# Patient Record
Sex: Female | Born: 1940
Health system: Southern US, Community
[De-identification: ages and names within clinical notes are randomized; demographics above are authoritative.]

## PROBLEM LIST (undated history)

## (undated) DIAGNOSIS — G479 Sleep disorder, unspecified: Secondary | ICD-10-CM

## (undated) DIAGNOSIS — I2510 Atherosclerotic heart disease of native coronary artery without angina pectoris: Secondary | ICD-10-CM

## (undated) DIAGNOSIS — F32A Depression, unspecified: Secondary | ICD-10-CM

## (undated) DIAGNOSIS — R011 Cardiac murmur, unspecified: Secondary | ICD-10-CM

## (undated) DIAGNOSIS — Z8719 Personal history of other diseases of the digestive system: Secondary | ICD-10-CM

## (undated) DIAGNOSIS — E213 Hyperparathyroidism, unspecified: Secondary | ICD-10-CM

## (undated) DIAGNOSIS — D649 Anemia, unspecified: Secondary | ICD-10-CM

## (undated) DIAGNOSIS — M797 Fibromyalgia: Secondary | ICD-10-CM

## (undated) DIAGNOSIS — E079 Disorder of thyroid, unspecified: Secondary | ICD-10-CM

## (undated) DIAGNOSIS — M353 Polymyalgia rheumatica: Secondary | ICD-10-CM

## (undated) DIAGNOSIS — F329 Major depressive disorder, single episode, unspecified: Secondary | ICD-10-CM

## (undated) DIAGNOSIS — M1990 Unspecified osteoarthritis, unspecified site: Secondary | ICD-10-CM

## (undated) DIAGNOSIS — F419 Anxiety disorder, unspecified: Secondary | ICD-10-CM

## (undated) DIAGNOSIS — E538 Deficiency of other specified B group vitamins: Secondary | ICD-10-CM

## (undated) DIAGNOSIS — I10 Essential (primary) hypertension: Secondary | ICD-10-CM

## (undated) DIAGNOSIS — E119 Type 2 diabetes mellitus without complications: Secondary | ICD-10-CM

## (undated) DIAGNOSIS — E039 Hypothyroidism, unspecified: Secondary | ICD-10-CM

## (undated) HISTORY — DX: Deficiency of other specified B group vitamins: E53.8

## (undated) HISTORY — PX: CATARACT EXTRACTION, BILATERAL: SHX1313

## (undated) HISTORY — PX: OOPHORECTOMY: SHX86

## (undated) HISTORY — PX: BARIATRIC SURGERY: SHX1103

## (undated) HISTORY — PX: ABDOMINAL HYSTERECTOMY: SHX81

## (undated) HISTORY — DX: Essential (primary) hypertension: I10

## (undated) HISTORY — PX: OTHER SURGICAL HISTORY: SHX169

## (undated) HISTORY — DX: Depression, unspecified: F32.A

## (undated) HISTORY — DX: Disorder of thyroid, unspecified: E07.9

## (undated) HISTORY — PX: TONSILLECTOMY: SUR1361

## (undated) HISTORY — DX: Anemia, unspecified: D64.9

## (undated) HISTORY — PX: CARDIAC CATHETERIZATION: SHX172

## (undated) HISTORY — DX: Major depressive disorder, single episode, unspecified: F32.9

---

## 2000-02-02 ENCOUNTER — Encounter: Payer: Self-pay | Admitting: Emergency Medicine

## 2000-02-02 ENCOUNTER — Emergency Department (HOSPITAL_COMMUNITY): Admission: EM | Admit: 2000-02-02 | Discharge: 2000-02-02 | Payer: Self-pay | Admitting: Internal Medicine

## 2001-02-02 ENCOUNTER — Encounter: Admission: RE | Admit: 2001-02-02 | Discharge: 2001-05-03 | Payer: Self-pay | Admitting: Occupational Medicine

## 2001-04-08 ENCOUNTER — Encounter: Admission: RE | Admit: 2001-04-08 | Discharge: 2001-04-08 | Payer: Self-pay | Admitting: Occupational Medicine

## 2001-04-08 ENCOUNTER — Encounter: Payer: Self-pay | Admitting: Occupational Medicine

## 2001-08-07 ENCOUNTER — Encounter: Payer: Self-pay | Admitting: Neurosurgery

## 2001-08-10 ENCOUNTER — Inpatient Hospital Stay (HOSPITAL_COMMUNITY): Admission: RE | Admit: 2001-08-10 | Discharge: 2001-08-11 | Payer: Self-pay | Admitting: Neurosurgery

## 2001-08-10 ENCOUNTER — Encounter: Payer: Self-pay | Admitting: Neurosurgery

## 2001-08-14 ENCOUNTER — Encounter: Payer: Self-pay | Admitting: Neurosurgery

## 2001-08-14 ENCOUNTER — Inpatient Hospital Stay (HOSPITAL_COMMUNITY): Admission: AD | Admit: 2001-08-14 | Discharge: 2001-08-19 | Payer: Self-pay | Admitting: Neurosurgery

## 2001-10-28 ENCOUNTER — Ambulatory Visit (HOSPITAL_COMMUNITY): Admission: RE | Admit: 2001-10-28 | Discharge: 2001-10-28 | Payer: Self-pay | Admitting: Neurosurgery

## 2001-10-28 ENCOUNTER — Encounter: Payer: Self-pay | Admitting: Neurosurgery

## 2001-12-03 ENCOUNTER — Encounter: Payer: Self-pay | Admitting: Critical Care Medicine

## 2001-12-03 ENCOUNTER — Inpatient Hospital Stay (HOSPITAL_COMMUNITY): Admission: RE | Admit: 2001-12-03 | Discharge: 2001-12-08 | Payer: Self-pay | Admitting: Neurosurgery

## 2001-12-03 ENCOUNTER — Encounter: Payer: Self-pay | Admitting: Neurosurgery

## 2002-03-24 ENCOUNTER — Encounter: Admission: RE | Admit: 2002-03-24 | Discharge: 2002-05-31 | Payer: Self-pay | Admitting: Neurosurgery

## 2003-08-07 ENCOUNTER — Emergency Department (HOSPITAL_COMMUNITY): Admission: EM | Admit: 2003-08-07 | Discharge: 2003-08-07 | Payer: Self-pay | Admitting: Emergency Medicine

## 2003-12-02 ENCOUNTER — Ambulatory Visit: Payer: Self-pay | Admitting: Internal Medicine

## 2004-03-01 ENCOUNTER — Ambulatory Visit: Payer: Self-pay | Admitting: Internal Medicine

## 2004-04-05 ENCOUNTER — Encounter: Admission: RE | Admit: 2004-04-05 | Discharge: 2004-07-04 | Payer: Self-pay | Admitting: Surgery

## 2004-04-17 ENCOUNTER — Ambulatory Visit (HOSPITAL_BASED_OUTPATIENT_CLINIC_OR_DEPARTMENT_OTHER): Admission: RE | Admit: 2004-04-17 | Discharge: 2004-04-17 | Payer: Self-pay | Admitting: Surgery

## 2004-04-20 ENCOUNTER — Ambulatory Visit: Payer: Self-pay | Admitting: Internal Medicine

## 2004-04-22 ENCOUNTER — Ambulatory Visit: Payer: Self-pay | Admitting: Internal Medicine

## 2004-04-27 ENCOUNTER — Ambulatory Visit: Payer: Self-pay | Admitting: Internal Medicine

## 2004-05-03 ENCOUNTER — Ambulatory Visit: Payer: Self-pay | Admitting: Internal Medicine

## 2004-05-10 ENCOUNTER — Ambulatory Visit: Payer: Self-pay | Admitting: Internal Medicine

## 2004-05-15 ENCOUNTER — Ambulatory Visit (HOSPITAL_COMMUNITY): Admission: RE | Admit: 2004-05-15 | Discharge: 2004-05-15 | Payer: Self-pay | Admitting: Surgery

## 2004-07-25 ENCOUNTER — Encounter: Admission: RE | Admit: 2004-07-25 | Discharge: 2004-10-23 | Payer: Self-pay | Admitting: Surgery

## 2004-08-09 ENCOUNTER — Ambulatory Visit: Payer: Self-pay | Admitting: Internal Medicine

## 2004-08-16 ENCOUNTER — Ambulatory Visit: Payer: Self-pay | Admitting: Internal Medicine

## 2004-11-16 ENCOUNTER — Ambulatory Visit: Payer: Self-pay | Admitting: Internal Medicine

## 2004-11-20 ENCOUNTER — Encounter: Admission: RE | Admit: 2004-11-20 | Discharge: 2005-02-18 | Payer: Self-pay | Admitting: Surgery

## 2004-12-04 ENCOUNTER — Inpatient Hospital Stay (HOSPITAL_COMMUNITY): Admission: RE | Admit: 2004-12-04 | Discharge: 2004-12-08 | Payer: Self-pay | Admitting: Surgery

## 2005-02-01 ENCOUNTER — Ambulatory Visit: Payer: Self-pay | Admitting: Internal Medicine

## 2005-03-26 ENCOUNTER — Encounter: Admission: RE | Admit: 2005-03-26 | Discharge: 2005-06-24 | Payer: Self-pay | Admitting: Surgery

## 2005-03-26 ENCOUNTER — Ambulatory Visit: Payer: Self-pay | Admitting: Internal Medicine

## 2005-04-02 ENCOUNTER — Ambulatory Visit: Payer: Self-pay | Admitting: Internal Medicine

## 2005-05-07 ENCOUNTER — Ambulatory Visit: Payer: Self-pay | Admitting: Internal Medicine

## 2005-05-30 ENCOUNTER — Ambulatory Visit: Payer: Self-pay | Admitting: Internal Medicine

## 2005-06-06 ENCOUNTER — Ambulatory Visit: Payer: Self-pay | Admitting: Internal Medicine

## 2005-06-09 ENCOUNTER — Emergency Department (HOSPITAL_COMMUNITY): Admission: EM | Admit: 2005-06-09 | Discharge: 2005-06-09 | Payer: Self-pay | Admitting: *Deleted

## 2005-09-18 ENCOUNTER — Ambulatory Visit: Payer: Self-pay | Admitting: Internal Medicine

## 2005-09-20 ENCOUNTER — Encounter: Admission: RE | Admit: 2005-09-20 | Discharge: 2005-09-20 | Payer: Self-pay | Admitting: Surgery

## 2005-09-25 ENCOUNTER — Ambulatory Visit: Payer: Self-pay | Admitting: Internal Medicine

## 2005-11-28 ENCOUNTER — Ambulatory Visit: Payer: Self-pay | Admitting: Internal Medicine

## 2005-12-04 ENCOUNTER — Encounter: Admission: RE | Admit: 2005-12-04 | Discharge: 2006-03-04 | Payer: Self-pay | Admitting: Surgery

## 2005-12-24 ENCOUNTER — Ambulatory Visit: Payer: Self-pay | Admitting: Internal Medicine

## 2005-12-24 LAB — CONVERTED CEMR LAB
T3, Free: 2.2 pg/mL — ABNORMAL LOW (ref 2.3–4.2)
T4, Total: 3.6 ug/dL — ABNORMAL LOW (ref 5.0–12.5)
TSH: 1.47 microintl units/mL (ref 0.35–5.50)

## 2006-01-01 ENCOUNTER — Ambulatory Visit: Payer: Self-pay | Admitting: Internal Medicine

## 2006-04-28 ENCOUNTER — Ambulatory Visit: Payer: Self-pay | Admitting: Internal Medicine

## 2006-04-28 LAB — CONVERTED CEMR LAB
ALT: 22 units/L (ref 0–40)
AST: 23 units/L (ref 0–37)
Albumin: 3.2 g/dL — ABNORMAL LOW (ref 3.5–5.2)
Alkaline Phosphatase: 101 units/L (ref 39–117)
BUN: 9 mg/dL (ref 6–23)
Basophils Absolute: 0 10*3/uL (ref 0.0–0.1)
Basophils Relative: 0.5 % (ref 0.0–1.0)
Bilirubin, Direct: 0.1 mg/dL (ref 0.0–0.3)
CO2: 30 meq/L (ref 19–32)
Calcium: 10.3 mg/dL (ref 8.4–10.5)
Chloride: 104 meq/L (ref 96–112)
Cholesterol: 200 mg/dL (ref 0–200)
Creatinine, Ser: 0.8 mg/dL (ref 0.4–1.2)
Direct LDL: 127.9 mg/dL
Eosinophils Absolute: 0 10*3/uL (ref 0.0–0.6)
Eosinophils Relative: 0.6 % (ref 0.0–5.0)
GFR calc Af Amer: 92 mL/min
GFR calc non Af Amer: 76 mL/min
Glucose, Bld: 117 mg/dL — ABNORMAL HIGH (ref 70–99)
HCT: 42.1 % (ref 36.0–46.0)
HDL: 39.1 mg/dL (ref >39.0)
Hemoglobin: 13.9 g/dL (ref 12.0–15.0)
Hgb A1c MFr Bld: 5.8 % (ref 4.6–6.0)
Lymphocytes Relative: 26.2 % (ref 12.0–46.0)
MCHC: 33 g/dL (ref 30.0–36.0)
MCV: 86.7 fL (ref 78.0–100.0)
Monocytes Absolute: 0.5 10*3/uL (ref 0.2–0.7)
Monocytes Relative: 10.6 % (ref 3.0–11.0)
Neutro Abs: 2.7 10*3/uL (ref 1.4–7.7)
Neutrophils Relative %: 62.1 % (ref 43.0–77.0)
Platelets: 182 10*3/uL (ref 150–400)
Potassium: 3.7 meq/L (ref 3.5–5.1)
RBC: 4.85 M/uL (ref 3.87–5.11)
RDW: 13.2 % (ref 11.5–14.6)
Sodium: 141 meq/L (ref 135–145)
TSH: 1.6 microintl units/mL (ref 0.35–5.50)
Total Bilirubin: 0.6 mg/dL (ref 0.3–1.2)
Total CHOL/HDL Ratio: 5.1
Total Protein: 6.4 g/dL (ref 6.0–8.3)
Triglycerides: 222 mg/dL (ref 0–149)
VLDL: 44 mg/dL — ABNORMAL HIGH (ref 0–40)
WBC: 4.4 10*3/uL — ABNORMAL LOW (ref 4.5–10.5)

## 2006-05-05 ENCOUNTER — Ambulatory Visit: Payer: Self-pay | Admitting: Internal Medicine

## 2006-05-07 ENCOUNTER — Ambulatory Visit: Payer: Self-pay | Admitting: Licensed Clinical Social Worker

## 2006-05-14 ENCOUNTER — Ambulatory Visit: Payer: Self-pay | Admitting: Licensed Clinical Social Worker

## 2006-05-28 ENCOUNTER — Ambulatory Visit: Payer: Self-pay | Admitting: Licensed Clinical Social Worker

## 2006-06-04 ENCOUNTER — Ambulatory Visit: Payer: Self-pay | Admitting: Licensed Clinical Social Worker

## 2006-06-24 ENCOUNTER — Ambulatory Visit: Payer: Self-pay | Admitting: Internal Medicine

## 2006-06-24 LAB — CONVERTED CEMR LAB
ALT: 31 units/L (ref 0–40)
AST: 30 units/L (ref 0–37)
Albumin: 3.3 g/dL — ABNORMAL LOW (ref 3.5–5.2)
Alkaline Phosphatase: 115 units/L (ref 39–117)
Bilirubin, Direct: 0.1 mg/dL (ref 0.0–0.3)
Cholesterol: 154 mg/dL (ref 0–200)
Direct LDL: 83.6 mg/dL
HDL: 34 mg/dL — ABNORMAL LOW (ref >39.0)
Total Bilirubin: 0.7 mg/dL (ref 0.3–1.2)
Total CHOL/HDL Ratio: 4.5
Total Protein: 5.9 g/dL — ABNORMAL LOW (ref 6.0–8.3)
Triglycerides: 208 mg/dL (ref 0–149)
VLDL: 42 mg/dL — ABNORMAL HIGH (ref 0–40)

## 2006-06-25 ENCOUNTER — Ambulatory Visit: Payer: Self-pay | Admitting: Licensed Clinical Social Worker

## 2006-07-15 ENCOUNTER — Ambulatory Visit: Payer: Self-pay | Admitting: Licensed Clinical Social Worker

## 2006-07-18 DIAGNOSIS — E1159 Type 2 diabetes mellitus with other circulatory complications: Secondary | ICD-10-CM | POA: Insufficient documentation

## 2006-07-18 DIAGNOSIS — J45909 Unspecified asthma, uncomplicated: Secondary | ICD-10-CM | POA: Insufficient documentation

## 2006-07-18 DIAGNOSIS — I10 Essential (primary) hypertension: Secondary | ICD-10-CM | POA: Insufficient documentation

## 2006-07-18 DIAGNOSIS — E119 Type 2 diabetes mellitus without complications: Secondary | ICD-10-CM | POA: Insufficient documentation

## 2006-07-18 DIAGNOSIS — F3342 Major depressive disorder, recurrent, in full remission: Secondary | ICD-10-CM | POA: Insufficient documentation

## 2006-07-22 ENCOUNTER — Ambulatory Visit: Payer: Self-pay | Admitting: Licensed Clinical Social Worker

## 2006-07-23 ENCOUNTER — Telehealth: Payer: Self-pay | Admitting: *Deleted

## 2006-07-30 ENCOUNTER — Encounter: Payer: Self-pay | Admitting: Internal Medicine

## 2006-07-30 ENCOUNTER — Ambulatory Visit: Payer: Self-pay | Admitting: Internal Medicine

## 2006-08-05 ENCOUNTER — Ambulatory Visit: Payer: Self-pay | Admitting: Licensed Clinical Social Worker

## 2006-09-09 ENCOUNTER — Ambulatory Visit: Payer: Self-pay | Admitting: Licensed Clinical Social Worker

## 2006-09-23 ENCOUNTER — Ambulatory Visit: Payer: Self-pay | Admitting: Licensed Clinical Social Worker

## 2006-11-13 ENCOUNTER — Ambulatory Visit: Payer: Self-pay | Admitting: Internal Medicine

## 2007-02-13 ENCOUNTER — Ambulatory Visit: Payer: Self-pay | Admitting: Internal Medicine

## 2007-02-13 LAB — CONVERTED CEMR LAB
BUN: 9 mg/dL (ref 6–23)
CO2: 29 meq/L (ref 19–32)
Calcium: 10.1 mg/dL (ref 8.4–10.5)
Chloride: 108 meq/L (ref 96–112)
Creatinine, Ser: 0.7 mg/dL (ref 0.4–1.2)
GFR calc Af Amer: 108 mL/min
GFR calc non Af Amer: 89 mL/min
Glucose, Bld: 126 mg/dL — ABNORMAL HIGH (ref 70–99)
Hgb A1c MFr Bld: 6.3 % — ABNORMAL HIGH (ref 4.6–6.0)
Potassium: 4.6 meq/L (ref 3.5–5.1)
Sodium: 141 meq/L (ref 135–145)

## 2007-02-19 ENCOUNTER — Ambulatory Visit: Payer: Self-pay | Admitting: Licensed Clinical Social Worker

## 2007-03-04 ENCOUNTER — Ambulatory Visit: Payer: Self-pay | Admitting: Licensed Clinical Social Worker

## 2007-06-12 ENCOUNTER — Ambulatory Visit: Payer: Self-pay | Admitting: Internal Medicine

## 2007-06-12 DIAGNOSIS — D649 Anemia, unspecified: Secondary | ICD-10-CM | POA: Insufficient documentation

## 2007-06-12 LAB — CONVERTED CEMR LAB
BUN: 10 mg/dL (ref 6–23)
CO2: 28 meq/L (ref 19–32)
Calcium: 10.2 mg/dL (ref 8.4–10.5)
Chloride: 107 meq/L (ref 96–112)
Creatinine, Ser: 0.8 mg/dL (ref 0.4–1.2)
GFR calc Af Amer: 92 mL/min
GFR calc non Af Amer: 76 mL/min
Glucose, Bld: 103 mg/dL — ABNORMAL HIGH (ref 70–99)
Hgb A1c MFr Bld: 6.4 % — ABNORMAL HIGH (ref 4.6–6.0)
Potassium: 3.9 meq/L (ref 3.5–5.1)
Sodium: 135 meq/L (ref 135–145)

## 2007-07-13 ENCOUNTER — Telehealth (INDEPENDENT_AMBULATORY_CARE_PROVIDER_SITE_OTHER): Payer: Self-pay | Admitting: *Deleted

## 2007-08-24 ENCOUNTER — Ambulatory Visit: Payer: Self-pay | Admitting: Licensed Clinical Social Worker

## 2007-09-09 ENCOUNTER — Ambulatory Visit: Payer: Self-pay | Admitting: Internal Medicine

## 2007-09-09 DIAGNOSIS — E039 Hypothyroidism, unspecified: Secondary | ICD-10-CM | POA: Insufficient documentation

## 2007-09-09 LAB — CONVERTED CEMR LAB
GFR calc Af Amer: 80 mL/min
GFR calc non Af Amer: 66 mL/min
Potassium: 4 meq/L (ref 3.5–5.1)
Sodium: 140 meq/L (ref 135–145)
T3, Free: 3.1 pg/mL (ref 2.3–4.2)
TSH: 0.05 microintl units/mL — ABNORMAL LOW (ref 0.35–5.50)

## 2007-09-16 ENCOUNTER — Telehealth: Payer: Self-pay | Admitting: Internal Medicine

## 2007-09-20 ENCOUNTER — Observation Stay (HOSPITAL_COMMUNITY): Admission: EM | Admit: 2007-09-20 | Discharge: 2007-09-21 | Payer: Self-pay | Admitting: Emergency Medicine

## 2007-09-20 ENCOUNTER — Ambulatory Visit: Payer: Self-pay | Admitting: Internal Medicine

## 2007-10-01 ENCOUNTER — Ambulatory Visit: Payer: Self-pay | Admitting: Family Medicine

## 2007-10-01 DIAGNOSIS — N390 Urinary tract infection, site not specified: Secondary | ICD-10-CM | POA: Insufficient documentation

## 2007-10-01 DIAGNOSIS — M549 Dorsalgia, unspecified: Secondary | ICD-10-CM | POA: Insufficient documentation

## 2007-10-01 LAB — CONVERTED CEMR LAB
Bilirubin Urine: NEGATIVE
Blood in Urine, dipstick: NEGATIVE
Glucose, Urine, Semiquant: NEGATIVE
Ketones, urine, test strip: NEGATIVE
Nitrite: NEGATIVE
Protein, U semiquant: NEGATIVE
Specific Gravity, Urine: 1.015
Urobilinogen, UA: 0.2
pH: 5.5

## 2007-10-09 ENCOUNTER — Ambulatory Visit: Payer: Self-pay | Admitting: Internal Medicine

## 2007-10-09 DIAGNOSIS — R071 Chest pain on breathing: Secondary | ICD-10-CM | POA: Insufficient documentation

## 2007-11-05 ENCOUNTER — Ambulatory Visit: Payer: Self-pay | Admitting: Internal Medicine

## 2007-11-27 ENCOUNTER — Ambulatory Visit (HOSPITAL_COMMUNITY): Admission: RE | Admit: 2007-11-27 | Discharge: 2007-11-27 | Payer: Self-pay | Admitting: Internal Medicine

## 2008-01-05 ENCOUNTER — Ambulatory Visit: Payer: Self-pay | Admitting: Internal Medicine

## 2008-01-05 DIAGNOSIS — E1169 Type 2 diabetes mellitus with other specified complication: Secondary | ICD-10-CM | POA: Insufficient documentation

## 2008-01-05 DIAGNOSIS — E785 Hyperlipidemia, unspecified: Secondary | ICD-10-CM

## 2008-01-05 LAB — CONVERTED CEMR LAB
BUN: 10 mg/dL (ref 6–23)
Basophils Absolute: 0 10*3/uL (ref 0.0–0.1)
Basophils Relative: 0.8 % (ref 0.0–3.0)
CO2: 31 meq/L (ref 19–32)
Calcium: 10.5 mg/dL (ref 8.4–10.5)
Chloride: 105 meq/L (ref 96–112)
Cholesterol: 210 mg/dL (ref 0–200)
Creatinine, Ser: 0.7 mg/dL (ref 0.4–1.2)
Direct LDL: 134.6 mg/dL
Eosinophils Absolute: 0.3 10*3/uL (ref 0.0–0.7)
Eosinophils Relative: 6.1 % — ABNORMAL HIGH (ref 0.0–5.0)
GFR calc Af Amer: 107 mL/min
GFR calc non Af Amer: 89 mL/min
Glucose, Bld: 99 mg/dL (ref 70–99)
HCT: 40.4 % (ref 36.0–46.0)
HDL: 45.8 mg/dL (ref >39.0)
Hemoglobin: 13.9 g/dL (ref 12.0–15.0)
Hgb A1c MFr Bld: 6.1 % — ABNORMAL HIGH (ref 4.6–6.0)
Lymphocytes Relative: 26.1 % (ref 12.0–46.0)
MCHC: 34.4 g/dL (ref 30.0–36.0)
MCV: 87.6 fL (ref 78.0–100.0)
Monocytes Absolute: 0.6 10*3/uL (ref 0.1–1.0)
Monocytes Relative: 12 % (ref 3.0–12.0)
Neutro Abs: 3 10*3/uL (ref 1.4–7.7)
Neutrophils Relative %: 55 % (ref 43.0–77.0)
Platelets: 178 10*3/uL (ref 150–400)
Potassium: 4 meq/L (ref 3.5–5.1)
RBC: 4.61 M/uL (ref 3.87–5.11)
RDW: 12.5 % (ref 11.5–14.6)
Sodium: 141 meq/L (ref 135–145)
TSH: 0.07 microintl units/mL — ABNORMAL LOW (ref 0.35–5.50)
Total CHOL/HDL Ratio: 4.6
Triglycerides: 164 mg/dL — ABNORMAL HIGH (ref 0–149)
VLDL: 33 mg/dL (ref 0–40)
WBC: 5.3 10*3/uL (ref 4.5–10.5)

## 2008-02-08 ENCOUNTER — Telehealth: Payer: Self-pay | Admitting: *Deleted

## 2008-03-24 ENCOUNTER — Telehealth: Payer: Self-pay | Admitting: Internal Medicine

## 2008-04-01 ENCOUNTER — Telehealth: Payer: Self-pay | Admitting: Internal Medicine

## 2008-04-26 ENCOUNTER — Telehealth (INDEPENDENT_AMBULATORY_CARE_PROVIDER_SITE_OTHER): Payer: Self-pay | Admitting: *Deleted

## 2008-05-11 ENCOUNTER — Telehealth: Payer: Self-pay | Admitting: Internal Medicine

## 2008-05-19 ENCOUNTER — Telehealth: Payer: Self-pay | Admitting: Internal Medicine

## 2008-06-23 ENCOUNTER — Telehealth: Payer: Self-pay | Admitting: Internal Medicine

## 2008-06-24 ENCOUNTER — Encounter: Payer: Self-pay | Admitting: Internal Medicine

## 2008-07-05 ENCOUNTER — Ambulatory Visit: Payer: Self-pay | Admitting: Internal Medicine

## 2008-07-05 LAB — CONVERTED CEMR LAB
BUN: 13 mg/dL (ref 6–23)
Basophils Absolute: 0 10*3/uL (ref 0.0–0.1)
Basophils Relative: 0.7 % (ref 0.0–3.0)
CO2: 29 meq/L (ref 19–32)
Calcium: 10.6 mg/dL — ABNORMAL HIGH (ref 8.4–10.5)
Chloride: 106 meq/L (ref 96–112)
Cholesterol: 226 mg/dL — ABNORMAL HIGH (ref 0–200)
Creatinine, Ser: 0.9 mg/dL (ref 0.4–1.2)
Direct LDL: 150.9 mg/dL
Eosinophils Absolute: 0.6 10*3/uL (ref 0.0–0.7)
Eosinophils Relative: 8.7 % — ABNORMAL HIGH (ref 0.0–5.0)
Folate: >20 ng/mL
GFR calc non Af Amer: 66.12 mL/min (ref >60)
Glucose, Bld: 116 mg/dL — ABNORMAL HIGH (ref 70–99)
HCT: 39.6 % (ref 36.0–46.0)
HDL: 42.2 mg/dL (ref >39.00)
Hemoglobin: 13.5 g/dL (ref 12.0–15.0)
Iron: 50 ug/dL (ref 42–145)
Lymphocytes Relative: 21 % (ref 12.0–46.0)
Lymphs Abs: 1.4 10*3/uL (ref 0.7–4.0)
MCHC: 34.2 g/dL (ref 30.0–36.0)
MCV: 87.4 fL (ref 78.0–100.0)
Monocytes Absolute: 0.7 10*3/uL (ref 0.1–1.0)
Monocytes Relative: 10.1 % (ref 3.0–12.0)
Neutro Abs: 3.8 10*3/uL (ref 1.4–7.7)
Neutrophils Relative %: 59.5 % (ref 43.0–77.0)
Platelets: 184 10*3/uL (ref 150.0–400.0)
Potassium: 4.3 meq/L (ref 3.5–5.1)
RBC: 4.53 M/uL (ref 3.87–5.11)
RDW: 13 % (ref 11.5–14.6)
Saturation Ratios: 17.6 % — ABNORMAL LOW (ref 20.0–50.0)
Sodium: 139 meq/L (ref 135–145)
TSH: 0.08 microintl units/mL — ABNORMAL LOW (ref 0.35–5.50)
Transferrin: 202.8 mg/dL — ABNORMAL LOW (ref 212.0–360.0)
Vitamin B-12: 901 pg/mL (ref 211–911)
WBC: 6.5 10*3/uL (ref 4.5–10.5)

## 2008-08-03 ENCOUNTER — Telehealth (INDEPENDENT_AMBULATORY_CARE_PROVIDER_SITE_OTHER): Payer: Self-pay | Admitting: *Deleted

## 2008-10-26 ENCOUNTER — Telehealth: Payer: Self-pay | Admitting: Internal Medicine

## 2008-10-27 ENCOUNTER — Ambulatory Visit: Payer: Self-pay | Admitting: Internal Medicine

## 2008-11-16 ENCOUNTER — Encounter: Payer: Self-pay | Admitting: Internal Medicine

## 2008-11-29 ENCOUNTER — Ambulatory Visit (HOSPITAL_COMMUNITY): Admission: RE | Admit: 2008-11-29 | Discharge: 2008-11-29 | Payer: Self-pay | Admitting: Internal Medicine

## 2009-01-04 ENCOUNTER — Ambulatory Visit: Payer: Self-pay | Admitting: Internal Medicine

## 2009-01-04 DIAGNOSIS — R519 Headache, unspecified: Secondary | ICD-10-CM | POA: Insufficient documentation

## 2009-01-04 DIAGNOSIS — R51 Headache: Secondary | ICD-10-CM | POA: Insufficient documentation

## 2009-01-04 LAB — CONVERTED CEMR LAB
Cholesterol, target level: 200 mg/dL
Cholesterol: 200 mg/dL (ref 0–200)
Direct LDL: 131.2 mg/dL
HDL goal, serum: 40 mg/dL
HDL: 35.1 mg/dL — ABNORMAL LOW (ref >39.00)
Hgb A1c MFr Bld: 6.2 % (ref 4.6–6.5)
LDL Goal: 100 mg/dL
TSH: 0.07 microintl units/mL — ABNORMAL LOW (ref 0.35–5.50)

## 2009-02-09 ENCOUNTER — Telehealth: Payer: Self-pay | Admitting: Internal Medicine

## 2009-03-10 ENCOUNTER — Ambulatory Visit: Payer: Self-pay | Admitting: Internal Medicine

## 2009-03-10 DIAGNOSIS — L538 Other specified erythematous conditions: Secondary | ICD-10-CM | POA: Insufficient documentation

## 2009-03-10 LAB — CONVERTED CEMR LAB
ALT: 34 units/L (ref 0–35)
AST: 30 units/L (ref 0–37)
Albumin: 3.7 g/dL (ref 3.5–5.2)
Alkaline Phosphatase: 116 units/L (ref 39–117)
BUN: 11 mg/dL (ref 6–23)
Basophils Absolute: 0 10*3/uL (ref 0.0–0.1)
Basophils Relative: 0.5 % (ref 0.0–3.0)
Bilirubin, Direct: 0.1 mg/dL (ref 0.0–0.3)
CO2: 31 meq/L (ref 19–32)
Calcium: 10.8 mg/dL — ABNORMAL HIGH (ref 8.4–10.5)
Chloride: 104 meq/L (ref 96–112)
Creatinine, Ser: 0.9 mg/dL (ref 0.4–1.2)
Eosinophils Absolute: 0.4 10*3/uL (ref 0.0–0.7)
Eosinophils Relative: 9.3 % — ABNORMAL HIGH (ref 0.0–5.0)
GFR calc non Af Amer: 65.99 mL/min (ref >60)
Glucose, Bld: 114 mg/dL — ABNORMAL HIGH (ref 70–99)
HCT: 42.7 % (ref 36.0–46.0)
Hemoglobin: 13.9 g/dL (ref 12.0–15.0)
Hgb A1c MFr Bld: 6.3 % (ref 4.6–6.5)
Lymphocytes Relative: 29.5 % (ref 12.0–46.0)
Lymphs Abs: 1.3 10*3/uL (ref 0.7–4.0)
MCHC: 32.7 g/dL (ref 30.0–36.0)
MCV: 89.7 fL (ref 78.0–100.0)
Monocytes Absolute: 0.5 10*3/uL (ref 0.1–1.0)
Monocytes Relative: 11.2 % (ref 3.0–12.0)
Neutro Abs: 2.3 10*3/uL (ref 1.4–7.7)
Neutrophils Relative %: 49.5 % (ref 43.0–77.0)
Platelets: 167 10*3/uL (ref 150.0–400.0)
Potassium: 5.2 meq/L — ABNORMAL HIGH (ref 3.5–5.1)
RBC: 4.76 M/uL (ref 3.87–5.11)
RDW: 12.8 % (ref 11.5–14.6)
Sodium: 141 meq/L (ref 135–145)
Total Bilirubin: 0.6 mg/dL (ref 0.3–1.2)
Total Protein: 7.3 g/dL (ref 6.0–8.3)
WBC: 4.5 10*3/uL (ref 4.5–10.5)

## 2009-05-19 ENCOUNTER — Ambulatory Visit: Payer: Self-pay | Admitting: Internal Medicine

## 2009-05-19 LAB — CONVERTED CEMR LAB
BUN: 11 mg/dL (ref 6–23)
CO2: 32 meq/L (ref 19–32)
Calcium: 10.5 mg/dL (ref 8.4–10.5)
Chloride: 105 meq/L (ref 96–112)
Cholesterol: 202 mg/dL — ABNORMAL HIGH (ref 0–200)
Creatinine, Ser: 0.9 mg/dL (ref 0.4–1.2)
Direct LDL: 129.7 mg/dL
GFR calc non Af Amer: 65.95 mL/min (ref >60)
Glucose, Bld: 113 mg/dL — ABNORMAL HIGH (ref 70–99)
HDL: 44.2 mg/dL (ref >39.00)
Potassium: 5.1 meq/L (ref 3.5–5.1)
Sodium: 144 meq/L (ref 135–145)
TSH: 0.17 microintl units/mL — ABNORMAL LOW (ref 0.35–5.50)

## 2009-06-01 ENCOUNTER — Telehealth: Payer: Self-pay | Admitting: Internal Medicine

## 2009-07-10 ENCOUNTER — Telehealth: Payer: Self-pay | Admitting: Internal Medicine

## 2009-09-19 ENCOUNTER — Ambulatory Visit: Payer: Self-pay | Admitting: Internal Medicine

## 2009-09-19 DIAGNOSIS — M25579 Pain in unspecified ankle and joints of unspecified foot: Secondary | ICD-10-CM | POA: Insufficient documentation

## 2009-09-19 LAB — CONVERTED CEMR LAB
BUN: 18 mg/dL (ref 6–23)
CO2: 30 meq/L (ref 19–32)
Calcium: 10.8 mg/dL — ABNORMAL HIGH (ref 8.4–10.5)
Chloride: 103 meq/L (ref 96–112)
Creatinine, Ser: 1.1 mg/dL (ref 0.4–1.2)
GFR calc non Af Amer: 53.96 mL/min (ref >60)
Glucose, Bld: 116 mg/dL — ABNORMAL HIGH (ref 70–99)
Hgb A1c MFr Bld: 6.5 % (ref 4.6–6.5)
Potassium: 5.3 meq/L — ABNORMAL HIGH (ref 3.5–5.1)
Sodium: 141 meq/L (ref 135–145)

## 2009-09-19 LAB — HM DIABETES FOOT EXAM

## 2009-10-24 LAB — HM DIABETES EYE EXAM

## 2009-10-27 ENCOUNTER — Inpatient Hospital Stay (HOSPITAL_COMMUNITY): Admission: RE | Admit: 2009-10-27 | Discharge: 2009-10-30 | Payer: Self-pay | Admitting: Orthopedic Surgery

## 2009-11-02 ENCOUNTER — Encounter: Payer: Self-pay | Admitting: Internal Medicine

## 2009-11-23 ENCOUNTER — Ambulatory Visit: Payer: Self-pay | Admitting: Internal Medicine

## 2009-11-23 LAB — CONVERTED CEMR LAB: Hgb A1c MFr Bld: 6.2 % (ref 4.6–6.5)

## 2010-01-29 ENCOUNTER — Ambulatory Visit
Admission: RE | Admit: 2010-01-29 | Discharge: 2010-01-29 | Payer: Self-pay | Source: Home / Self Care | Attending: Internal Medicine | Admitting: Internal Medicine

## 2010-01-29 ENCOUNTER — Other Ambulatory Visit: Payer: Self-pay | Admitting: Internal Medicine

## 2010-01-29 LAB — CBC WITH DIFFERENTIAL/PLATELET
Basophils Absolute: 0 10*3/uL (ref 0.0–0.1)
Basophils Relative: 0.5 % (ref 0.0–3.0)
Eosinophils Absolute: 0.4 10*3/uL (ref 0.0–0.7)
Eosinophils Relative: 7.7 % — ABNORMAL HIGH (ref 0.0–5.0)
HCT: 39.9 % (ref 36.0–46.0)
Hemoglobin: 13.2 g/dL (ref 12.0–15.0)
Lymphocytes Relative: 22.2 % (ref 12.0–46.0)
Lymphs Abs: 1.2 10*3/uL (ref 0.7–4.0)
MCHC: 33.1 g/dL (ref 30.0–36.0)
MCV: 87 fl (ref 78.0–100.0)
Monocytes Absolute: 0.5 10*3/uL (ref 0.1–1.0)
Monocytes Relative: 8.7 % (ref 3.0–12.0)
Neutro Abs: 3.3 10*3/uL (ref 1.4–7.7)
Neutrophils Relative %: 60.9 % (ref 43.0–77.0)
Platelets: 201 10*3/uL (ref 150.0–400.0)
RBC: 4.58 Mil/uL (ref 3.87–5.11)
RDW: 15.8 % — ABNORMAL HIGH (ref 11.5–14.6)
WBC: 5.5 10*3/uL (ref 4.5–10.5)

## 2010-01-29 LAB — T4, FREE: Free T4: 0.4 ng/dL — ABNORMAL LOW (ref 0.60–1.60)

## 2010-01-29 LAB — IBC PANEL
Iron: 57 ug/dL (ref 42–145)
Saturation Ratios: 15.9 % — ABNORMAL LOW (ref 20.0–50.0)
Transferrin: 256.1 mg/dL (ref 212.0–360.0)

## 2010-01-29 LAB — BASIC METABOLIC PANEL
BUN: 15 mg/dL (ref 6–23)
CO2: 29 mEq/L (ref 19–32)
Calcium: 10.9 mg/dL — ABNORMAL HIGH (ref 8.4–10.5)
Chloride: 106 mEq/L (ref 96–112)
Creatinine, Ser: 0.8 mg/dL (ref 0.4–1.2)
GFR: 72.26 mL/min (ref >60.00)
Glucose, Bld: 108 mg/dL — ABNORMAL HIGH (ref 70–99)
Potassium: 4.8 mEq/L (ref 3.5–5.1)
Sodium: 143 mEq/L (ref 135–145)

## 2010-01-29 LAB — T3, FREE: T3, Free: 1.7 pg/mL — ABNORMAL LOW (ref 2.3–4.2)

## 2010-01-29 LAB — TSH: TSH: 10.88 u[IU]/mL — ABNORMAL HIGH (ref 0.35–5.50)

## 2010-01-29 LAB — HEMOGLOBIN A1C: Hgb A1c MFr Bld: 6.4 % (ref 4.6–6.5)

## 2010-02-01 ENCOUNTER — Telehealth: Payer: Self-pay | Admitting: Internal Medicine

## 2010-02-20 NOTE — Assessment & Plan Note (Signed)
Summary: rov/mm rsc appt time/njr   Vital Signs:  Patient profile:   70 year old female Height:      64 inches Weight:      240 pounds BMI:     41.34 Temp:     98.2 degrees F oral Pulse rate:   76 / minute Resp:     14 per minute BP sitting:   130 / 80  (left arm) Cuff size:   large  Vitals Entered By: Willy Eddy, LPN (September 19, 2009 9:58 AM)  Nutrition Counseling: Patient's BMI is greater than 25 and therefore counseled on weight management options. CC: roa, Hypertension Management Is Patient Diabetic? No   Primary Care Provider:  Stacie Glaze MD  CC:  roa and Hypertension Management.  History of Present Illness: Stable weight is increased she is divorcing  her husband and court procedings start this month the division of the marital assests this has distracted her from exercize and weight loss and  diet   Hypertension History:      She denies headache, chest pain, palpitations, dyspnea with exertion, orthopnea, PND, peripheral edema, visual symptoms, neurologic problems, syncope, and side effects from treatment.        Positive major cardiovascular risk factors include female age 31 years old or older, diabetes, hyperlipidemia, and hypertension.  Negative major cardiovascular risk factors include non-tobacco-user status.        Further assessment for target organ damage reveals no history of ASHD, stroke/TIA, or peripheral vascular disease.     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 Take 1 Tablet By Mouth Once A Day 2)  Diclofenac Sodium 75 Mg  Tbec (Diclofenac Sodium) .... Take 1 Tablet By Mouth Two Times A Day 3)  Tandem Plus 162-115.2-1 Mg Caps (Fefum-Fepo-Fa-B Cmp-C-Zn-Mn-Cu) .Marland Kitchen.. 1 Two Times A Day 4)  Fluticasone Propionate 50 Mcg/act Susp (Fluticasone Propionate) .... Two Sprays Q Nare Daily 5)  Cobal-1000 1000 Mcg/ml Inj Soln (Cyanocobalamin) .Marland Kitchen.. 1cc Im  Monthly 6)  Effexor Xr 150 Mg Xr24h-Cap (Venlafaxine Hcl) .... One By Mouth Day 7)  D 1000 Plus  Tabs (Fa-Cyanocobalamin-B6-D-Ca) .Marland Kitchen.. 1 Two Times A Day 8)  Precision Xtra Blood Glucose  Strp (Glucose Blood) .... Check Sugars Daily 9)  Armour Thyroid 240 Mg Tabs (Thyroid) .Marland Kitchen.. 1 Once Daily 10)  Fish Oil Concentrate 1000 Mg Caps (Omega-3 Fatty Acids) .... One By Mouth Two Times A Day  11)  Cafgesic 325-250-20-50 Mg Caps (Apap-Salicyl-Phenyltolox-Caff) .... One By Mouth Q 4-6 Hour As Needed Pain 12)  Hydroxyzine Hcl 25 Mg Tabs (Hydroxyzine Hcl) .... One By Mouth Tid 13)  Cyclobenzaprine Hcl 10 Mg Tabs (Cyclobenzaprine Hcl) .... One By Mouth Three Times A Day As Needed Neck Pain  Current Medications (verified): 1)  Singulair 10 Mg  Tabs (Montelukast Sodium) .... Take 1 Tablet By Mouth Once A Day 2)  Diclofenac Sodium 75 Mg  Tbec (Diclofenac Sodium) .... Take 1 Tablet By Mouth Two Times A Day 3)  Tandem Plus 162-115.2-1 Mg Caps  (Fefum-Fepo-Fa-B Cmp-C-Zn-Mn-Cu) .Marland Kitchen.. 1 Two Times A Day 4)  Fluticasone Propionate 50 Mcg/act Susp (Fluticasone Propionate) .... Two Sprays Q Nare Daily 5)  Cobal-1000 1000 Mcg/ml Inj Soln (Cyanocobalamin) .Marland Kitchen.. 1cc Im  Monthly 6)  Effexor Xr 150 Mg Xr24h-Cap (Venlafaxine Hcl) .... One By Mouth Day 7)  D 1000 Plus  Tabs (Fa-Cyanocobalamin-B6-D-Ca) .Marland Kitchen.. 1 Two Times A Day 8)  Precision Xtra Blood Glucose  Strp (Glucose Blood) .... Check Sugars Daily 9)  Armour Thyroid 240 Mg Tabs (Thyroid) .Marland Kitchen.. 1 Once Daily 10)  Fish Oil Concentrate 1000 Mg Caps (Omega-3 Fatty Acids) .... One By Mouth Two Times A Day 11)  Cafgesic 325-250-20-50 Mg Caps (Apap-Salicyl-Phenyltolox-Caff) .... One By Mouth Q 4-6 Hour As Needed Pain 12)  Hydroxyzine Hcl 25 Mg Tabs (Hydroxyzine Hcl) .... One By Mouth Tid 13)  Cyclobenzaprine Hcl 10 Mg Tabs (Cyclobenzaprine Hcl) .... One By Mouth Three Times A Day As Needed Neck Pain  Allergies (verified): 1)  ! Zithromax 2)  ! Ace Inhibitors 3)  ! Codeine  Past History:  Family History: Last updated: 11/13/2006 mother CVA at 30 father CA  ( GI liver CA) 33  Social History: Last updated: 07/30/2006 Retired Married Former Smoker  Risk Factors: Smoking Status: quit (09/19/2009)  Past medical, surgical, family and social histories (including risk factors) reviewed, and no changes noted (except as noted below).  Past Medical History: Reviewed history from 11/05/2007 and no changes required. Asthma Depression Hypertension b 12 def ( obesity surgery) Anemia-NOS Hypothyroidism  Past Surgical History: Reviewed history from 07/30/2006 and no changes required. TAH + O Hysterectomy Weight loss reduction sx Lumbar laminectomy L3-4  Family History: Reviewed history from 11/13/2006 and no changes required. mother CVA at 35 father CA  ( GI liver CA) 41  Social History: Reviewed history from 07/30/2006 and no changes required. Retired Married Former Smoker  Review  of Systems  The patient denies anorexia, fever, weight loss, weight gain, vision loss, decreased hearing, hoarseness, chest pain, syncope, dyspnea on exertion, peripheral edema, prolonged cough, headaches, hemoptysis, abdominal pain, melena, hematochezia, severe indigestion/heartburn, hematuria, incontinence, genital sores, muscle weakness, suspicious skin lesions, transient blindness, difficulty walking, depression, unusual weight change, abnormal bleeding, enlarged lymph nodes, angioedema, and breast masses.    Physical Exam  General:  overweight-appearing.  alert.   Head:  atraumatic.  normocephalic.   Eyes:  pupils equal and pupils round.   Ears:  R ear normal and L ear normal.  R ear normal and L ear normal.   Nose:  no external deformity.  no external deformity.   Mouth:  pharynx pink and moist and no erythema.   Neck:  supple and full ROM.  supple and full ROM.   Lungs:  normal respiratory effort and normal breath sounds.  Mild increase in I/E ratio. Peak flow 300  Heart:  normal rate and regular rhythm.  normal rate and regular rhythm.  Diabetes Management Exam:    Foot Exam (with socks and/or shoes not present):       Sensory-Pinprick/Light touch:          Left medial foot (L-4): diminished          Left dorsal foot (L-5): diminished          Left lateral foot (S-1): diminished          Right medial foot (L-4): diminished          Right dorsal foot (L-5): diminished          Right lateral foot (S-1): diminished       Sensory-Monofilament:          Left foot: diminished          Right foot: diminished   Impression & Recommendations:  Problem # 1:  HYPERLIPIDEMIA (ICD-272.4) Assessment Improved  Labs Reviewed: SGOT: 30 (03/10/2009)   SGPT: 34 (03/10/2009)  Lipid Goals: Chol Goal: 200 (01/04/2009)   HDL Goal: 40 (01/04/2009)   LDL Goal: 100 (01/04/2009)   TG Goal: 150 (01/04/2009)  Prior 10 Yr Risk Heart Disease: Not enough information (02/13/2007)   HDL:44.20  (05/19/2009), 35.10 (01/04/2009)  LDL:DEL (01/05/2008), DEL (06/24/2006)  Chol:202 (05/19/2009), 200 (01/04/2009)  Trig:164 (01/05/2008), 208 (06/24/2006)  Problem # 2:  HYPOTHYROIDISM (ICD-244.9) Assessment: Unchanged  Her updated medication list for this problem includes:    Armour Thyroid 240 Mg Tabs (Thyroid) .Marland Kitchen... 1 once daily  Labs Reviewed: TSH: 0.17 (05/19/2009)   Free T4: 0.8 (09/09/2007)    HgBA1c: 6.3 (03/10/2009) Chol: 202 (05/19/2009)   HDL: 44.20 (05/19/2009)   LDL: DEL (01/05/2008)   TG: 164 (01/05/2008)  Problem # 3:  HYPERTENSION (ICD-401.9)  BP today: 130/80 Prior BP: 134/80 (05/19/2009)  Prior 10 Yr Risk Heart Disease: Not enough information (02/13/2007)  Labs Reviewed: K+: 5.1 (05/19/2009) Creat: : 0.9 (05/19/2009)   Chol: 202 (05/19/2009)   HDL: 44.20 (05/19/2009)   LDL: DEL (01/05/2008)   TG: 164 (01/05/2008)  Problem # 4:  DM (ICD-250.00) Assessment: Unchanged  Labs Reviewed: Creat: 0.9 (05/19/2009)     Last Eye Exam: normal (11/22/2008) Reviewed HgBA1c results: 6.3 (03/10/2009)  6.2 (01/04/2009)  Orders: Venipuncture (16109) TLB-BMP (Basic Metabolic Panel-BMET) (80048-METABOL) TLB-A1C / Hgb A1C (Glycohemoglobin) (83036-A1C)  Problem # 5:  DEPRESSION (ICD-311)  Her updated medication list for this problem includes:    Effexor Xr 150 Mg Xr24h-cap (Venlafaxine hcl) ..... One by mouth day    Hydroxyzine Hcl 25 Mg Tabs (Hydroxyzine hcl) ..... One by mouth tid    Alprazolam 0.5 Mg Tabs (Alprazolam) .Marland Kitchen... 1/2 to 1 by mouth q 6 hours  as needed for anxiety  Discussed treatment options, including trial of antidpressant medication. Will refer to behavioral health. Follow-up call in in 24-48 hours and recheck in 2 weeks, sooner as needed. Patient agrees to call if any worsening of symptoms or thoughts of doing harm arise. Verified that the patient has no suicidal ideation at this time.   Problem # 6:  ANKLE PAIN, CHRONIC (ICD-719.47) has surgery planned  for left ankle for flat foot deformity this has interferred with walking  Complete Medication List: 1)  Singulair 10 Mg Tabs (Montelukast sodium) .... Take 1 tablet by mouth once a day 2)  Diclofenac Sodium 75 Mg Tbec (Diclofenac sodium) .... Take 1 tablet by mouth two times a day 3)  Tandem Plus 162-115.2-1 Mg Caps (Fefum-fepo-fa-b cmp-c-zn-mn-cu) .Marland Kitchen.. 1 two times a day 4)  Fluticasone Propionate 50 Mcg/act Susp (  Fluticasone propionate) .... Two sprays q nare daily 5)  Cobal-1000 1000 Mcg/ml Inj Soln (Cyanocobalamin) .Marland Kitchen.. 1cc im  monthly 6)  Effexor Xr 150 Mg Xr24h-cap (Venlafaxine hcl) .... One by mouth day 7)  D 1000 Plus Tabs (Fa-cyanocobalamin-b6-d-ca) .Marland Kitchen.. 1 two times a day 8)  Precision Xtra Blood Glucose Strp (Glucose blood) .... Check sugars daily 9)  Armour Thyroid 240 Mg Tabs (Thyroid) .Marland Kitchen.. 1 once daily 10)  Fish Oil Concentrate 1000 Mg Caps (Omega-3 fatty acids) .... One by mouth two times a day 11)  Cafgesic 325-250-20-50 Mg Caps (Apap-salicyl-phenyltolox-caff) .... One by mouth q 4-6 hour as needed pain 12)  Hydroxyzine Hcl 25 Mg Tabs (Hydroxyzine hcl) .... One by mouth tid 13)  Cyclobenzaprine Hcl 10 Mg Tabs (Cyclobenzaprine hcl) .... One by mouth three times a day as needed neck pain 14)  Alprazolam 0.5 Mg Tabs (Alprazolam) .... 1/2 to 1 by mouth q 6 hours  as needed for anxiety  Hypertension Assessment/Plan:      The patient's hypertensive risk group is category C: Target organ damage and/or diabetes.  Today's blood pressure is 130/80.  Her blood pressure goal is < 130/80.  Patient Instructions: 1)  Please schedule a follow-up appointment in 2 months. Prescriptions: ALPRAZOLAM 0.5 MG TABS (ALPRAZOLAM) 1/2 to 1 by mouth q 6 hours  as needed for anxiety  #30 x 0   Entered and Authorized by:   Stacie Glaze MD   Signed by:   Stacie Glaze MD on 09/19/2009   Method used:   Print then Give to Patient   RxID:   [id number] COBAL-1000 1000 MCG/ML INJ SOLN  (CYANOCOBALAMIN) 1cc IM  monthly  #10 Millilite x 1   Entered by:   Willy Eddy, LPN   Authorized by:   Stacie Glaze MD   Signed by:   Willy Eddy, LPN on 47/42/5956   Method used:   Electronically to        [facility] [address] (retail)       [phone]       [place], [place]  [address]       Ph: [phone]       Fax: [phone]   RxID:   [id number]   Appended Document: Orders Update    Clinical Lists Changes  Orders: Added new Service order of Specimen Handling ([id number]) - Signed

## 2010-02-20 NOTE — Progress Notes (Signed)
Summary: please return call  Phone Note Call from Patient Call back at Home Phone [phone]   Caller: Patient-live call Reason for Call: Talk to Nurse Summary of Call: pt is on Amourthyroid 240 mg . They have the new tablet. Please write new rx. Wants Bonnye to return call, personally, so she can explain. Initial call taken by: Warnell Forester,  February 09, 2009 9:42 AM    New/Updated Medications: ARMOUR THYROID 240 MG TABS (THYROID) 1 once daily Prescriptions: ARMOUR THYROID 240 MG TABS (THYROID) 1 once daily  #90 x 3   Entered by:   Willy Eddy, LPN   Authorized by:   Stacie Glaze MD   Signed by:   Willy Eddy, LPN on 09/81/1914   Method used:   Electronically to        [facility] [address] (retail)       [phone]       [place], [place]  [address]       Ph: [phone]       Fax: [phone]   RxID:   [id number]

## 2010-02-20 NOTE — Assessment & Plan Note (Signed)
Summary: 2 MONTH ROV/NJR/pt rescd from bump//ccm   Vital Signs:  Patient profile:   70 year old female Height:      64 inches Weight:      240 pounds BMI:     41.34 Temp:     98.2 degrees F oral Pulse rate:   76 / minute Resp:     14 per minute BP sitting:   134 / 80  (left arm) Cuff size:   large  Vitals Entered By: Willy Eddy, LPN (May 19, 2009 10:00 AM) CC: roa-fell backwards several days ago and hit back of head- now sore and knot, Hypertension Management, Lipid Management   CC:  roa-fell backwards several days ago and hit back of head- now sore and knot, Hypertension Management, and Lipid Management.  History of Present Illness: present for follow up of limpid and HTn as well as DM she feels she has been doing well  she had a trauma to her head about one week ago and has a knot on the back of her head that is tender No LOC  Hypertension History:      She denies headache, chest pain, palpitations, dyspnea with exertion, orthopnea, PND, peripheral edema, visual symptoms, neurologic problems, syncope, and side effects from treatment.        Positive major cardiovascular risk factors include female age 19 years old or older, diabetes, hyperlipidemia, and hypertension.  Negative major cardiovascular risk factors include non-tobacco-user status.        Further assessment for target organ damage reveals no history of ASHD, stroke/TIA, or peripheral vascular disease.    Lipid Management History:      Positive NCEP/ATP III risk factors include female age 35 years old or older, diabetes, HDL cholesterol less than 40, and hypertension.  Negative NCEP/ATP III risk factors include non-tobacco-user status, no ASHD (atherosclerotic heart disease), no prior stroke/TIA, no peripheral vascular disease, and no history of aortic aneurysm.      Preventive Screening-Counseling & Management  Alcohol-Tobacco     Smoking Status: quit  Current Problems (verified): 1)  Granuloma  Annulare  (ICD-695.89) 2)  Headache  (ICD-784.0) 3)  Skin Rash  (ICD-782.1) 4)  Hyperlipidemia  (ICD-272.4) 5)  Hypothyroidism  (ICD-244.9) 6)  Uti  (ICD-599.0) 7)  Chest Wall Pain, Anterior  (ICD-786.52) 8)  Back Pain  (ICD-724.5) 9)  Unspecified Hypothyroidism  (ICD-244.9) 10)  Anemia-nos  (ICD-285.9) 11)  Hypertension  (ICD-401.9) 12)  Dm  (ICD-250.00) 13)  Depression  (ICD-311) 14)  Asthma  (ICD-493.90)  Current Medications (verified): 1)  Singulair 10 Mg  Tabs (Montelukast Sodium) .... Take 1 Tablet By Mouth Once A Day 2)  Diclofenac Sodium 75 Mg  Tbec (Diclofenac Sodium) .... Take 1 Tablet By Mouth Two Times A Day 3)  Tandem Plus 162-115.2-1 Mg Caps (Fefum-Fepo-Fa-B Cmp-C-Zn-Mn-Cu) .Marland Kitchen.. 1 Two Times A Day 4)  Fluticasone Propionate 50 Mcg/act Susp (Fluticasone Propionate) .... Two Sprays Q Nare Daily 5)  Cobal-1000 1000 Mcg/ml Inj Soln (Cyanocobalamin) .Marland Kitchen.. 1cc Im  Monthly 6)  Effexor Xr 150 Mg Xr24h-Cap (Venlafaxine Hcl) .... One By Mouth Day 7)  D 1000 Plus  Tabs (Fa-Cyanocobalamin-B6-D-Ca) .Marland Kitchen.. 1 Two Times A Day 8)  Precision Xtra Blood Glucose  Strp (Glucose Blood) .... Check Sugars Daily 9)  Armour Thyroid 240 Mg Tabs (Thyroid) .Marland Kitchen.. 1 Once Daily 10)  Fish Oil Concentrate 1000 Mg Caps (Omega-3 Fatty Acids) .... One By Mouth Two Times A Day 11)  Cafgesic 325-250-20-50 Mg Caps (Apap-Salicyl-Phenyltolox-Caff) .... One  By Mouth Q 4-6 Hour As Needed Pain 12)  Hydroxyzine Hcl 25 Mg Tabs (Hydroxyzine Hcl) .... One By Mouth Tid  Allergies: 1)  ! Zithromax 2)  ! Ace Inhibitors 3)  ! Codeine  Past History:  Family History: Last updated: 11/13/2006 mother CVA at 21 father CA  ( GI liver CA) 40  Social History: Last updated: 07/30/2006 Retired Married Former Smoker  Risk Factors: Smoking Status: quit (05/19/2009)  Past medical, surgical, family and social histories (including risk factors) reviewed, and no changes noted (except as noted below).  Past Medical  History: Reviewed history from 11/05/2007 and no changes required. Asthma Depression Hypertension b 12 def ( obesity surgery) Anemia-NOS Hypothyroidism  Past Surgical History: Reviewed history from 07/30/2006 and no changes required. TAH + O Hysterectomy Weight loss reduction sx Lumbar laminectomy L3-4  Family History: Reviewed history from 11/13/2006 and no changes required. mother CVA at 37 father CA  ( GI liver CA) 69  Social History: Reviewed history from 07/30/2006 and no changes required. Retired Married Former Smoker  Review of Systems  The patient denies anorexia, fever, weight loss, weight gain, vision loss, decreased hearing, hoarseness, chest pain, syncope, dyspnea on exertion, peripheral edema, prolonged cough, headaches, hemoptysis, abdominal pain, melena, hematochezia, severe indigestion/heartburn, hematuria, incontinence, genital sores, muscle weakness, suspicious skin lesions, transient blindness, difficulty walking, depression, unusual weight change, abnormal bleeding, enlarged lymph nodes, angioedema, and breast masses.    Physical Exam  General:  overweight-appearing.  alert.   Head:  atraumatic.  normocephalic.   Eyes:  pupils equal and pupils round.   Ears:  R ear normal and L ear normal.  R ear normal and L ear normal.   Nose:  no external deformity.  no external deformity.   Mouth:  pharynx pink and moist and no erythema.   Neck:  supple and full ROM.  supple and full ROM.   Lungs:  normal respiratory effort and normal breath sounds.  Mild increase in I/E ratio. Peak flow 300  Heart:  normal rate and regular rhythm.  normal rate and regular rhythm.   Pulses:  R and L carotid,radial,femoral,dorsalis pedis and posterior tibial pulses are full and equal bilaterally Extremities:  trace left pedal edema and trace right pedal edema.   Neurologic:  alert & oriented X3 and DTRs symmetrical and normal.     Impression & Recommendations:  Problem # 1:   HYPERLIPIDEMIA (ICD-272.4)  Labs Reviewed: SGOT: 30 (03/10/2009)   SGPT: 34 (03/10/2009)  Lipid Goals: Chol Goal: 200 (01/04/2009)   HDL Goal: 40 (01/04/2009)   LDL Goal: 100 (01/04/2009)   TG Goal: 150 (01/04/2009)  Prior 10 Yr Risk Heart Disease: Not enough information (02/13/2007)   HDL:35.10 (01/04/2009), 42.20 (07/05/2008)  LDL:DEL (01/05/2008), DEL (06/24/2006)  Chol:200 (01/04/2009), 226 (07/05/2008)  Trig:164 (01/05/2008), 208 (06/24/2006)  Orders: TLB-Cholesterol, HDL (83718-HDL) TLB-Cholesterol, Direct LDL (83721-DIRLDL) TLB-Cholesterol, Total (82465-CHO)  Problem # 2:  HYPOTHYROIDISM (ICD-244.9)  Her updated medication list for this problem includes:    Armour Thyroid 240 Mg Tabs (Thyroid) .Marland Kitchen... 1 once daily  Labs Reviewed: TSH: 0.07 (01/04/2009)   Free T4: 0.8 (09/09/2007)    HgBA1c: 6.3 (03/10/2009) Chol: 200 (01/04/2009)   HDL: 35.10 (01/04/2009)   LDL: DEL (01/05/2008)   TG: 164 (01/05/2008)  Orders: Venipuncture (16109) TLB-TSH (Thyroid Stimulating Hormone) (84443-TSH)  Problem # 3:  HYPERTENSION (ICD-401.9)  Orders: TLB-BMP (Basic Metabolic Panel-BMET) (80048-METABOL)  BP today: 134/80 Prior BP: 154/88 (03/10/2009)  Prior 10 Yr Risk Heart Disease: Not enough  information (02/13/2007)  Labs Reviewed: K+: 5.2 (03/10/2009) Creat: : 0.9 (03/10/2009)   Chol: 200 (01/04/2009)   HDL: 35.10 (01/04/2009)   LDL: DEL (01/05/2008)   TG: 164 (01/05/2008)  Problem # 4:  DM (ICD-250.00) moniter the potassium the last reading Labs Reviewed: Creat: 0.9 (03/10/2009)     Last Eye Exam: normal (11/22/2008) Reviewed HgBA1c results: 6.3 (03/10/2009)  6.2 (01/04/2009)  Complete Medication List: 1)  Singulair 10 Mg Tabs (Montelukast sodium) .... Take 1 tablet by mouth once a day 2)  Diclofenac Sodium 75 Mg Tbec (Diclofenac sodium) .... Take 1 tablet by mouth two times a day 3)  Tandem Plus 162-115.2-1 Mg Caps (Fefum-fepo-fa-b cmp-c-zn-mn-cu) .Marland Kitchen.. 1 two times a day 4)   Fluticasone Propionate 50 Mcg/act Susp (Fluticasone propionate) .... Two sprays q nare daily 5)  Cobal-1000 1000 Mcg/ml Inj Soln (Cyanocobalamin) .Marland Kitchen.. 1cc im  monthly 6)  Effexor Xr 150 Mg Xr24h-cap (Venlafaxine hcl) .... One by mouth day 7)  D 1000 Plus Tabs (Fa-cyanocobalamin-b6-d-ca) .Marland Kitchen.. 1 two times a day 8)  Precision Xtra Blood Glucose Strp (Glucose blood) .... Check sugars daily 9)  Armour Thyroid 240 Mg Tabs (Thyroid) .Marland Kitchen.. 1 once daily 10)  Fish Oil Concentrate 1000 Mg Caps (Omega-3 fatty acids) .... One by mouth two times a day 11)  Cafgesic 325-250-20-50 Mg Caps (Apap-salicyl-phenyltolox-caff) .... One by mouth q 4-6 hour as needed pain 12)  Hydroxyzine Hcl 25 Mg Tabs (Hydroxyzine hcl) .... One by mouth tid  Hypertension Assessment/Plan:      The patient's hypertensive risk group is category C: Target organ damage and/or diabetes.  Today's blood pressure is 134/80.  Her blood pressure goal is < 130/80.  Lipid Assessment/Plan:      Based on NCEP/ATP III, the patient's risk factor category is "history of diabetes".  The patient's lipid goals are as follows: Total cholesterol goal is 200; LDL cholesterol goal is 100; HDL cholesterol goal is 40; Triglyceride goal is 150.  Her LDL cholesterol goal has not been met.  Secondary causes for hyperlipidemia have been ruled out.  She has been counseled on adjunctive measures for lowering her cholesterol and has been provided with dietary instructions.    Patient Instructions: 1)  Please schedule a follow-up appointment in 4 months. Prescriptions: COBAL-1000 1000 MCG/ML INJ SOLN (CYANOCOBALAMIN) 1cc IM  monthly  #10 [place]il[place] x 1   Entered by:   Willy Eddy, LPN   Authorized by:   Stacie Glaze MD   Signed by:   Willy Eddy, LPN on 16/10/9602   Method used:   Electronically to        [facility] [address] (retail)       [phone]       [place], [place]  [address]       Ph: [phone]        Fax: [phone]   RxID:   [id number] TANDEM PLUS 162-115.2-1 MG CAPS (FEFUM-FEPO-FA-B CMP-C-ZN-MN-CU) 1 two times a day  #180 Capsule x 3   Entered by:   Willy Eddy, LPN   Authorized by:   Stacie Glaze MD   Signed by:   Willy Eddy, LPN on 25/36/6440   Method used:   Electronically to        [facility] # [phone]* (retail)       [address]       [place], [place]  [address]  Ph: [phone]       Fax: [phone]   RxID:   [id number]

## 2010-02-20 NOTE — Progress Notes (Signed)
Summary: muscle relaxers  Phone Note Call from Patient   Caller: Patient Call For: Stacie Glaze MD Summary of Call: Pt has been working in the yard and is having muscle spasms in her neck and left shoulder.  Is asking for generic Flexeril called to [facility]) [phone] Initial call taken by: Lynann Beaver CMA,  Jun 01, 2009 10:34 AM  Follow-up for Phone Call        per dr Yvonne Kendall have cyclbenzaprine 10 1 three times a day as needed #30 with1 refill Follow-up by: Willy Eddy, LPN,  Jun 01, 2009 11:33 AM    New/Updated Medications: CYCLOBENZAPRINE HCL 10 MG TABS (CYCLOBENZAPRINE HCL) one by mouth three times a day as needed neck pain Prescriptions: CYCLOBENZAPRINE HCL 10 MG TABS (CYCLOBENZAPRINE HCL) one by mouth three times a day as needed neck pain  #30 x 1   Entered by:   Lynann Beaver CMA   Authorized by:   Stacie Glaze MD   Signed by:   Lynann Beaver CMA on 06/01/2009   Method used:   Electronically to        [facility] [address] (retail)       [phone]       [place], [place]  [address]       Ph: [phone]       Fax: [phone]   RxID:   [id number]  Pt. notified.

## 2010-02-20 NOTE — Progress Notes (Signed)
Summary: Pt req refill of Diclofenac 75mg   and Armour Thyroid 240mg  [facility]  Phone Note Call from Patient Call back at [facility] Phone [phone]   Caller: Patient Summary of Call: Pt req refill of Diclofenac 75mg   and Armour Thyroid 240mg  through [facility] order. Pt says [facility] carries the Armour Thyroid.  Initial call taken by: Lucy Antigua,  July 10, 2009 9:51 AM    Prescriptions: ARMOUR THYROID 240 MG TABS (THYROID) 1 once daily  #90 x 3   Entered by:   Willy Eddy, LPN   Authorized by:   Stacie Glaze MD   Signed by:   Willy Eddy, LPN on 14/78/2956   Method used:   Electronically to        [facility]* (retail)             ,          Ph: [phone]       Fax: [phone]   RxID:   [id number] DICLOFENAC SODIUM 75 MG  TBEC (DICLOFENAC SODIUM) Take 1 tablet by mouth two times a day  #180 x 3   Entered by:   Willy Eddy, LPN   Authorized by:   Stacie Glaze MD   Signed by:   Willy Eddy, LPN on 03/47/4259   Method used:   Electronically to        [facility] (retail)             ,          Ph: [phone]       Fax: [phone]   RxID:   [id number]

## 2010-02-20 NOTE — Assessment & Plan Note (Signed)
Summary: 2 MNTH ROV//SLM   Vital Signs:  Patient profile:   70 year old female Height:      64 inches Weight:      232 pounds BMI:     39.97 Temp:     98.2 degrees F oral Pulse rate:   76 / minute Resp:     14 per minute BP sitting:   154 / 88  (left arm)  Vitals Entered By: Willy Eddy, LPN (March 10, 2009 9:40 AM) CC: roa   CC:  roa.  History of Present Illness: Has a granuloma on skin and the dermatologist is trying oinments to control itcing  Preventive Screening-Counseling & Management  Alcohol-Tobacco     Smoking Status: quit  Current Medications (verified): 1)  Singulair 10 Mg  Tabs (Montelukast Sodium) .... Take 1 Tablet By Mouth Once A Day 2)  Diclofenac Sodium 75 Mg  Tbec (Diclofenac Sodium) .... Take 1 Tablet By Mouth Two Times A Day 3)  Tandem Plus 162-115.2-1 Mg Caps (Fefum-Fepo-Fa-B Cmp-C-Zn-Mn-Cu) .Marland Kitchen.. 1 Two Times A Day 4)  Fluticasone Propionate 50 Mcg/act Susp (Fluticasone Propionate) .... Two Sprays Q Nare Daily 5)  Cobal-1000 1000 Mcg/ml Inj Soln (Cyanocobalamin) .Marland Kitchen.. 1cc Im  Monthly 6)  Effexor Xr 150 Mg Xr24h-Cap (Venlafaxine Hcl) .... One By Mouth Day 7)  D 1000 Plus  Tabs (Fa-Cyanocobalamin-B6-D-Ca) .Marland Kitchen.. 1 Two Times A Day 8)  Precision Xtra Blood Glucose  Strp (Glucose Blood) .... Check Sugars Daily 9)  Armour Thyroid 240 Mg Tabs (Thyroid) .Marland Kitchen.. 1 Once Daily 10)  Fish Oil Concentrate 1000 Mg Caps (Omega-3 Fatty Acids) .... One By Mouth Two Times A Day 11)  Cafgesic 325-250-20-50 Mg Caps (Apap-Salicyl-Phenyltolox-Caff) .... One By Mouth Q 4-6 Hour As Needed Pain  Allergies (verified): 1)  ! Zithromax 2)  ! Ace Inhibitors 3)  ! Codeine  Past History:  Family History: Last updated: 11/13/2006 mother CVA at 47 father CA  ( GI liver CA) 32  Social History: Last updated: 07/30/2006 Retired Married Former Smoker  Risk Factors: Smoking Status: quit (03/10/2009)  Past medical, surgical, family and social histories (including risk  factors) reviewed, and no changes noted (except as noted below).  Past Medical History: Reviewed history from 11/05/2007 and no changes required. Asthma Depression Hypertension b 12 def ( obesity surgery) Anemia-NOS Hypothyroidism  Past Surgical History: Reviewed history from 07/30/2006 and no changes required. TAH + O Hysterectomy Weight loss reduction sx Lumbar laminectomy L3-4  Family History: Reviewed history from 11/13/2006 and no changes required. mother CVA at 49 father CA  ( GI liver CA) 103  Social History: Reviewed history from 07/30/2006 and no changes required. Retired Married Former Smoker  Review of Systems  The patient denies anorexia, fever, weight loss, weight gain, vision loss, decreased hearing, hoarseness, chest pain, syncope, dyspnea on exertion, peripheral edema, prolonged cough, headaches, hemoptysis, abdominal pain, melena, hematochezia, severe indigestion/heartburn, hematuria, incontinence, genital sores, muscle weakness, suspicious skin lesions, transient blindness, difficulty walking, depression, unusual weight change, abnormal bleeding, enlarged lymph nodes, angioedema, and breast masses.    Physical Exam  General:  overweight-appearing.  alert.   Head:  atraumatic.  normocephalic.   Eyes:  pupils equal and pupils round.   Ears:  R ear normal and L ear normal.  R ear normal and L ear normal.   Nose:  no external deformity.  no external deformity.   Mouth:  pharynx pink and moist and no erythema.   Neck:  supple and full ROM.  supple and full ROM.   Lungs:  normal respiratory effort and normal breath sounds.  Mild increase in I/E ratio. Peak flow 300  Heart:  normal rate and regular rhythm.  normal rate and regular rhythm.   Pulses:  R and L carotid,radial,femoral,dorsalis pedis and posterior tibial pulses are full and equal bilaterally Extremities:  trace left pedal edema and trace right pedal edema.   Neurologic:  alert & oriented X3 and  DTRs symmetrical and normal.   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) Assessment New  added nicotinamide and topicals by dermatologist wil change  Orders: TLB-Hepatic/Liver Function Pnl (80076-HEPATIC)  Problem # 2:  HYPERTENSION (ICD-401.9)  BP today: 154/88 Prior BP: 136/90 (01/04/2009)  Prior 10 Yr Risk Heart Disease: Not enough information (02/13/2007)  Labs Reviewed: K+: 4.3 (07/05/2008) Creat: : 0.9 (07/05/2008)   Chol: 200 (01/04/2009)   HDL: 35.10 (01/04/2009)   LDL: DEL (01/05/2008)   TG: 164 (01/05/2008)  Orders: TLB-BMP (Basic Metabolic Panel-BMET) (80048-METABOL)  Problem # 3:  DM (ICD-250.00) Assessment: New  Labs Reviewed: Creat: 0.9 (07/05/2008)     Last Eye Exam: normal (11/22/2008) Reviewed HgBA1c results: 6.2 (01/04/2009)  6.1 (01/05/2008)  Orders: TLB-BMP (Basic Metabolic Panel-BMET) (80048-METABOL) TLB-A1C / Hgb A1C (Glycohemoglobin) (83036-A1C)  Problem # 4:  HYPOTHYROIDISM (ICD-244.9)  Her updated medication list for this problem includes:    Armour Thyroid 240 Mg Tabs (Thyroid) .Marland Kitchen... 1 once daily  Labs Reviewed: TSH: 0.07 (01/04/2009)   Free T4: 0.8 (09/09/2007)    HgBA1c: 6.2 (01/04/2009) Chol: 200 (01/04/2009)   HDL: 35.10 (01/04/2009)   LDL: DEL (01/05/2008)   TG: 164 (01/05/2008)  Complete Medication List: 1)  Singulair 10 Mg Tabs (Montelukast sodium) .... Take 1 tablet by mouth once a day 2)  Diclofenac Sodium 75 Mg Tbec (Diclofenac sodium) .... Take 1 tablet by mouth two times a day 3)  Tandem Plus 162-115.2-1 Mg Caps (Fefum-fepo-fa-b cmp-c-zn-mn-cu) .Marland Kitchen.. 1 two times a day 4)  Fluticasone Propionate 50 Mcg/act Susp (Fluticasone propionate) .... Two sprays q nare daily 5)  Cobal-1000 1000 Mcg/ml Inj Soln (Cyanocobalamin) .Marland Kitchen.. 1cc im  monthly 6)  Effexor Xr 150 Mg Xr24h-cap (Venlafaxine hcl) .... One by mouth day 7)  D 1000 Plus Tabs (Fa-cyanocobalamin-b6-d-ca) .Marland Kitchen.. 1  two times a day 8)  Precision Xtra Blood Glucose Strp (Glucose blood) .... Check sugars daily 9)  Armour Thyroid 240 Mg Tabs (Thyroid) .Marland Kitchen.. 1 once daily 10)  Fish Oil Concentrate 1000 Mg Caps (Omega-3 fatty acids) .... One by mouth two times a day 11)  Cafgesic 325-250-20-50 Mg Caps (Apap-salicyl-phenyltolox-caff) .... One by mouth q 4-6 hour as needed pain 12)  Tl Nicotinamide 750-25-1.5-0.5 Mg Tabs (Niacinamide-zinc-copper-fa) .... One by mouth daily with food please price for pt before filling  Other Orders: Venipuncture (09811) TLB-CBC Platelet - w/Differential (85025-CBCD)  Patient Instructions: 1)  Please schedule a follow-up appointment in 2 months. Prescriptions: TL NICOTINAMIDE 750-25-1.5-0.5 MG TABS (NIACINAMIDE-ZINC-COPPER-FA) one by mouth daily with food please price for pt before filling  #30 x 6   Entered and Authorized by:   Stacie Glaze MD   Signed by:   Stacie Glaze MD on 03/10/2009   Method used:   Electronically to        [facility]* (retail)       [address]       [place], [place]  [address]       Ph: [phone]  Fax: [phone]   RxID:   [id number]

## 2010-02-20 NOTE — Assessment & Plan Note (Signed)
Summary: 2 MONTH ROV/NJR   Vital Signs:  Patient profile:   70 year old [name] Height:      64 inches Temp:     98.2 degrees F oral Pulse rate:   80 / minute Resp:     14 per minute BP sitting:   130 / 80  (left arm)  Vitals Entered By: Willy Eddy, LPN (November 23, 2009 10:03 AM) CC: roa- at [facility] after foot surgery - armour thyroid was changed to 180 after thyroid panel Is Patient Diabetic? Yes Did you bring your meter with you today? No   Primary Care Provider:  Stacie Glaze MD  CC:  roa- at [facility] after foot surgery - armour thyroid was changed to 180 after thyroid panel.  History of Present Illness: STAYING A REHAB UNTIL dEC 5 T  CURRNETLY NOT WEIGHT BEARING The site is not healing as well as expected she state that the blood glucoses are in the 120 range she is not on any insulin SSI  The pt had her armour thyroid decreased to 180  blood work from the  [facility] reviewed Needs repeat ( scheduled in 6 weeks) I do not see an Aic in the blood work she has mild anxiety and takes xanax as needed  seeing Art Green at site Effexor increasd to 225   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) 3)  Headache  (ICD-784.0) 4)  Skin Rash  (ICD-782.1) 5)  Hyperlipidemia  (ICD-272.4) 6)  Hypothyroidism  (ICD-244.9) 7)  Uti  (ICD-599.0) 8)  Chest Wall Pain, Anterior  (ICD-786.Traci) 9)  Back Pain  (ICD-724.5) 10)  Unspecified Hypothyroidism  (ICD-244.9) 11)  Anemia-nos  (ICD-285.9) 12)  Hypertension  (ICD-401.9) 13)  Dm  (ICD-250.00) 14)  Depression  (ICD-311) 15)  Asthma  (ICD-493.90)  Current Problems (verified): 1)  Ankle Pain, Chronic  (ICD-719.47) 2)  Granuloma Annulare  (ICD-695.89) 3)  Headache  (ICD-784.0) 4)  Skin Rash  (ICD-782.1) 5)   Hyperlipidemia  (ICD-272.4) 6)  Hypothyroidism  (ICD-244.9) 7)  Uti  (ICD-599.0) 8)  Chest Wall Pain, Anterior  (ICD-786.Traci) 9)  Back Pain  (ICD-724.5) 10)  Unspecified Hypothyroidism  (ICD-244.9) 11)  Anemia-nos  (ICD-285.9) 12)  Hypertension  (ICD-401.9) 13)  Dm  (ICD-250.00) 14)  Depression  (ICD-311) 15)  Asthma  (ICD-493.90)  Medications Prior to Update: 1)  Singulair 10 Mg  Tabs (Montelukast Sodium) .... Take 1 Tablet By Mouth Once A Day 2)  Diclofenac Sodium 75 Mg  Tbec (Diclofenac Sodium) .... Take 1 Tablet By Mouth Two Times A Day 3)  Tandem Plus 162-115.2-1 Mg Caps (Fefum-Fepo-Fa-B Cmp-C-Zn-Mn-Cu) .Marland Kitchen.. 1 Two Times A Day 4)  Fluticasone Propionate 50 Mcg/act Susp (Fluticasone Propionate) .... Two Sprays Q Nare Daily 5)  Cobal-1000 1000 Mcg/ml Inj Soln (Cyanocobalamin) .Marland Kitchen.. 1cc Im  Monthly 6)  Effexor Xr 150 Mg Xr24h-Cap (Venlafaxine Hcl) .... One By Mouth Day 7)  D 1000 Plus  Tabs (Fa-Cyanocobalamin-B6-D-Ca) .Marland Kitchen.. 1 Two Times A Day 8)  Precision Xtra Blood Glucose  Strp (Glucose Blood) .... Check Sugars Daily 9)  Armour Thyroid 240 Mg Tabs (Thyroid) .Marland Kitchen.. 1 Once Daily 10)  Fish Oil Concentrate 1000 Mg Caps (Omega-3 Fatty Acids) .... One By Mouth Two Times A Day 11)  Cafgesic 325-250-20-50 Mg  Caps (Apap-Salicyl-Phenyltolox-Caff) .... One By Mouth Q 4-6 Hour As Needed Pain 12)  Hydroxyzine Hcl 25 Mg Tabs (Hydroxyzine Hcl) .... One By Mouth Tid 13)  Cyclobenzaprine Hcl 10 Mg Tabs (Cyclobenzaprine Hcl) .... One By Mouth Three Times A Day As Needed Neck Pain 14)  Alprazolam 0.5 Mg Tabs (Alprazolam) .... 1/2 To 1 By Mouth Q 6 Hours  As Needed For Anxiety  Current Medications (verified): 1)  Singulair 10 Mg  Tabs (Montelukast Sodium) .... Take 1 Tablet By Mouth Once A Day 2)  Diclofenac Sodium 75 Mg  Tbec (Diclofenac Sodium) .... Take 1 Tablet By Mouth Two Times A Day 3)  Tandem Plus 162-115.2-1 Mg Caps (Fefum-Fepo-Fa-B Cmp-C-Zn-Mn-Cu) .Marland Kitchen.. 1 Two Times A Day 4)  Fluticasone Propionate  50 Mcg/act Susp (Fluticasone Propionate) .... Two Sprays Q Nare Daily 5)  Cobal-1000 1000 Mcg/ml Inj Soln (Cyanocobalamin) .Marland Kitchen.. 1cc Im  Monthly 6)  Effexor Xr 150 Mg Xr24h-Cap (Venlafaxine Hcl) .... One By Mouth Day 7)  D 1000 Plus  Tabs (Fa-Cyanocobalamin-B6-D-Ca) .Marland Kitchen.. 1 Two Times A Day 8)  Precision Xtra Blood Glucose  Strp (Glucose Blood) .... Check Sugars Daily 9)  Armour Thyroid 180 Mg Tabs (Thyroid) .Marland Kitchen.. 1 Once Daily 10)  Fish Oil Concentrate 1000 Mg Caps (Omega-3 Fatty Acids) .... One By Mouth Two Times A Day 11)  Cafgesic 325-250-20-50 Mg Caps (Apap-Salicyl-Phenyltolox-Caff) .... One By Mouth Q 4-6 Hour As Needed Pain 12)  Hydroxyzine Hcl 25 Mg Tabs (Hydroxyzine Hcl) .... One By Mouth Tid 13)  Cyclobenzaprine Hcl 10 Mg Tabs (Cyclobenzaprine Hcl) .... One By Mouth Three Times A Day As Needed Neck Pain 14)  Alprazolam 0.5 Mg Tabs (Alprazolam) .... 1/2 To 1 By Mouth Q 6 Hours  As Needed For Anxiety  Allergies (verified): 1)  ! Zithromax 2)  ! Ace Inhibitors 3)  ! Codeine  Past History:  Family History: Last updated: 11/13/2006 mother CVA at 39 father CA  ( GI liver CA) 41  Social History: Last updated: 07/30/2006 Retired Married Former Smoker  Risk Factors: Smoking Status: quit (11/23/2009)  Past medical, surgical, family and social histories (including risk factors) reviewed, and no changes noted (except as noted below).  Past Medical History: Reviewed history from 11/05/2007 and no changes required. Asthma Depression Hypertension b 12 def ( obesity surgery) Anemia-NOS Hypothyroidism  Past Surgical History: TAH + O Hysterectomy Weight loss reduction sx Lumbar laminectomy L3-4 Pantailor athrodesis with rod placement left foot  Family History: Reviewed history from 11/13/2006 and no changes required. mother CVA at 54 father CA  ( GI liver CA) 19  Social History: Reviewed history from 07/30/2006 and no changes required. Retired Married Former  Smoker  Review of Systems  The patient denies anorexia, fever, weight loss, weight gain, vision loss, decreased hearing, hoarseness, chest pain, syncope, dyspnea on exertion, peripheral edema, prolonged cough, headaches, hemoptysis, abdominal pain, melena, hematochezia, severe indigestion/heartburn, hematuria, incontinence, genital sores, muscle weakness, suspicious skin lesions, transient blindness, difficulty walking, depression, unusual weight change, abnormal bleeding, enlarged lymph nodes, angioedema, and breast masses.    Physical Exam  General:  overweight-appearing.  alert.   Head:  atraumatic.  normocephalic.   Eyes:  pupils equal and pupils round.   Ears:  R ear normal and L ear normal.  R ear normal and L ear normal.   Nose:  no external deformity.  no external deformity.   Mouth:  pharynx pink and moist and no erythema.   Neck:  supple and full ROM.  supple and  full ROM.   Lungs:  normal respiratory effort and normal breath sounds.  Mild increase in I/E ratio. Peak flow 300  Heart:  normal rate and regular rhythm.  normal rate and regular rhythm.   Extremities:  trace left pedal edema and trace right pedal edema.   Neurologic:  alert & oriented X3 and DTRs symmetrical and normal.   Skin:  anullar rashes Psych:  Oriented X3, memory intact for recent and remote, and normally interactive.     Impression & Recommendations:  Problem # 1:  HYPERLIPIDEMIA (ICD-272.4)  Labs Reviewed: SGOT: 30 (03/10/2009)   SGPT: 34 (03/10/2009)  Lipid Goals: Chol Goal: 200 (01/04/2009)   HDL Goal: 40 (01/04/2009)   LDL Goal: 100 (01/04/2009)   TG Goal: 150 (01/04/2009)  Prior 10 Yr Risk Heart Disease: Not enough information (02/13/2007)   HDL:44.20 (05/19/2009), 35.10 (01/04/2009)  LDL:DEL (01/05/2008), DEL (06/24/2006)  Chol:202 (05/19/2009), 200 (01/04/2009)  Trig:164 (01/05/2008), 208 (06/24/2006)  Problem # 2:  ANKLE PAIN, CHRONIC (ICD-719.47) post surgery doing well and in  PT  Problem # 3:  HYPOTHYROIDISM (ICD-244.9) monitered at [facility] place by Dr Chilton Si Her updated medication list for this problem includes:    Armour Thyroid 180 Mg Tabs (Thyroid) .Marland Kitchen... 1 once daily note the dose change  Problem # 4:  DM (ICD-250.00) Assessment: Unchanged  Labs Reviewed: Creat: 1.1 (09/19/2009)     Last Eye Exam: normal (11/22/2008) Reviewed HgBA1c results: 6.5 (09/19/2009)  6.3 (03/10/2009)  Orders: Venipuncture (16109) T-HIV Antibody  (Reflex) ([id number])  Complete Medication List: 1)  Singulair 10 Mg Tabs (Montelukast sodium) .... Take 1 tablet by mouth once a day 2)  Diclofenac Sodium 75 Mg Tbec (Diclofenac sodium) .... Take 1 tablet by mouth two times a day 3)  Tandem Plus 162-115.2-1 Mg Caps (Fefum-fepo-fa-b cmp-c-zn-mn-cu) .Marland Kitchen.. 1 two times a day 4)  Fluticasone Propionate 50 Mcg/act Susp (Fluticasone propionate) .... Two sprays q nare daily 5)  Cobal-1000 1000 Mcg/ml Inj Soln (Cyanocobalamin) .Marland Kitchen.. 1cc im  monthly 6)  Effexor Xr 150 Mg Xr24h-cap (Venlafaxine hcl) .... One by mouth day 7)  D 1000 Plus Tabs (Fa-cyanocobalamin-b6-d-ca) .Marland Kitchen.. 1 two times a day 8)  Precision Xtra Blood Glucose Strp (Glucose blood) .... Check sugars daily 9)  Armour Thyroid 180 Mg Tabs (Thyroid) .Marland Kitchen.. 1 once daily 10)  Fish Oil Concentrate 1000 Mg Caps (Omega-3 fatty acids) .... One by mouth two times a day 11)  Cafgesic 325-250-20-50 Mg Caps (Apap-salicyl-phenyltolox-caff) .... One by mouth q 4-6 hour as needed pain 12)  Hydroxyzine Hcl 25 Mg Tabs (Hydroxyzine hcl) .... One by mouth tid 13)  Cyclobenzaprine Hcl 10 Mg Tabs (Cyclobenzaprine hcl) .... One by mouth three times a day as needed neck pain 14)  Alprazolam 0.5 Mg Tabs (Alprazolam) .... 1/2 to 1 by mouth q 6 hours  as needed for anxiety  Other Orders: TLB-A1C / Hgb A1C (Glycohemoglobin) (83036-A1C)  Patient Instructions: 1)  Please schedule a follow-up appointment in 2 months.   Orders Added: 1)  Venipuncture  [91478] 2)  T-HIV Antibody  (Reflex) [[id number]] 3)  Est. Patient Level IV [57846] 4)  TLB-A1C / Hgb A1C (Glycohemoglobin) [83036-A1C]  Appended Document: Orders Update    Clinical Lists Changes  Orders: Added new Service order of Specimen Handling (96295) - Signed Added new Test order of TLB-A1C / Hgb A1C (Glycohemoglobin) (83036-A1C) - Signed

## 2010-02-22 NOTE — Progress Notes (Signed)
Summary: armour thyroid dose change  Phone Note Outgoing Call   Call placed by: Mervin Kung, CMA (AAMA) Call placed to: Patient Summary of Call: Thyroid dose increased back to 180mg  1 once daily. Pt requested refill to [facility]. Please see append note 02/01/09. Nicki Guadalajara Fergerson CMA Duncan Dull)  February 01, 2010 12:18 PM     New/Updated Medications: ARMOUR THYROID 180 MG TABS (THYROID) Take 1 tablet once daily without food or other medications. Prescriptions: ARMOUR THYROID 180 MG TABS (THYROID) Take 1 tablet once daily without food or other medications.  #90 x 0   Entered by:   Mervin Kung CMA (AAMA)   Authorized by:   Stacie Glaze MD   Signed by:   Mervin Kung CMA (AAMA) on 02/01/2010   Method used:   Faxed to ...       [facility] (mail-order)             , [place]         Ph: [phone]       Fax: [phone]   RxID:   [id number]

## 2010-02-22 NOTE — Assessment & Plan Note (Signed)
Summary: 2 mo ROV/cb   Vital Signs:  Patient profile:   70 year old female Height:      64 inches Weight:      230 pounds BMI:     39.62 Temp:     98.2 degrees F oral Pulse rate:   76 / minute Resp:     14 per minute BP sitting:   140 / 90  (left arm)  Vitals Entered By: Willy Eddy, LPN (January 29, 2010 10:08 AM) CC: roa- released from rehab before Christmas--med ch anged -increased effexor- added 37.5 for a total of 187.5 qd and decresed armour thyroid to  120, Hypertension Management Is Patient Diabetic? Yes Did you bring your meter with you today? No Pain Assessment Patient in pain? no        Primary Care Provider:  Stacie Glaze MD  CC:  roa- released from rehab before Christmas--med ch anged -increased effexor- added 37.5 for a total of 187.5 qd and decresed armour thyroid to  120 and Hypertension Management.  History of Present Illness: Had foot surgery Dr Victorino Dike Released form [facility] using walker and in boot Asthma stable, HTN stable, DM  slightly worsened due to immobility and weight gain Armour thyroid reduced to 120mg  Effexor increased    Hypertension History:      She denies headache, chest pain, palpitations, dyspnea with exertion, orthopnea, PND, peripheral edema, visual symptoms, neurologic problems, syncope, and side effects from treatment.        Positive major cardiovascular risk factors include female age 1 years old or older, diabetes, hyperlipidemia, and hypertension.  Negative major cardiovascular risk factors include non-tobacco-user status.        Further assessment for target organ damage reveals no history of ASHD, stroke/TIA, or peripheral vascular disease.     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) 3)  Headache  (ICD-784.0) 4)   Skin Rash  (ICD-782.1) 5)  Hyperlipidemia  (ICD-272.4) 6)  Hypothyroidism  (ICD-244.9) 7)  Uti  (ICD-599.0) 8)  Chest Wall Pain, Anterior  (ICD-786.52) 9)  Back Pain  (ICD-724.5) 10)  Unspecified Hypothyroidism  (ICD-244.9) 11)  Anemia-nos  (ICD-285.9) 12)  Hypertension  (ICD-401.9) 13)  Dm  (ICD-250.00) 14)  Depression  (ICD-311) 15)  Asthma  (ICD-493.90)  Current Problems (verified): 1)  Ankle Pain, Chronic  (ICD-719.47) 2)  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 Take 1 Tablet By Mouth Once A Day 2)  Diclofenac Sodium 75 Mg  Tbec (Diclofenac Sodium) .... Take 1 Tablet By Mouth Two Times A Day 3)  Tandem Plus 162-115.2-1 Mg Caps (Fefum-Fepo-Fa-B Cmp-C-Zn-Mn-Cu) .Marland Kitchen.. 1 Two Times A Day 4)  Fluticasone Propionate 50 Mcg/act Susp (Fluticasone Propionate) .... Two Sprays Q Nare Daily 5)  Cobal-1000 1000 Mcg/ml Inj Soln (Cyanocobalamin) .Marland Kitchen.. 1cc Im  Monthly  6)  Effexor Xr 150 Mg Xr24h-Cap (Venlafaxine Hcl) .... One By Mouth Day 7)  D 1000 Plus  Tabs (Fa-Cyanocobalamin-B6-D-Ca) .Marland Kitchen.. 1 Two Times A Day 8)  Precision Xtra Blood Glucose  Strp (Glucose Blood) .... Check Sugars Daily 9)  Armour Thyroid 180 Mg Tabs (Thyroid) .Marland Kitchen.. 1 Once Daily 10)  Fish Oil Concentrate 1000 Mg Caps (Omega-3 Fatty Acids) .... One By Mouth Two Times A Day 11)  Cafgesic 325-250-20-50 Mg Caps (Apap-Salicyl-Phenyltolox-Caff) .... One By Mouth Q 4-6 Hour As Needed Pain 12)  Hydroxyzine Hcl 25 Mg Tabs (Hydroxyzine Hcl) .... One By Mouth Tid 13)   Cyclobenzaprine Hcl 10 Mg Tabs (Cyclobenzaprine Hcl) .... One By Mouth Three Times A Day As Needed Neck Pain 14)  Alprazolam 0.5 Mg Tabs (Alprazolam) .... 1/2 To 1 By Mouth Q 6 Hours  As Needed For Anxiety  Current Medications (verified): 1)  Singulair 10 Mg  Tabs (Montelukast Sodium) .... Take 1 Tablet By Mouth Once A Day 2)  Diclofenac Sodium 75 Mg  Tbec (Diclofenac Sodium) .... Take 1 Tablet By Mouth Two Times A Day 3)  Tandem Plus 162-115.2-1 Mg Caps (Fefum-Fepo-Fa-B Cmp-C-Zn-Mn-Cu) .Marland Kitchen.. 1 Two Times A Day 4)  Fluticasone Propionate 50 Mcg/act Susp (Fluticasone Propionate) .... Two Sprays Q Nare Daily 5)  Cobal-1000 1000 Mcg/ml Inj Soln (Cyanocobalamin) .Marland Kitchen.. 1cc Im  Monthly 6)  Effexor Xr 150 Mg Xr24h-Cap (Venlafaxine Hcl) .... One By Mouth Day 7)  D 1000 Plus  Tabs (Fa-Cyanocobalamin-B6-D-Ca) .Marland Kitchen.. 1 Two Times A Day 8)  Precision Xtra Blood Glucose  Strp (Glucose Blood) .... Check Sugars Daily 9)  Armour Thyroid 120 Mg Tabs (Thyroid) .Marland Kitchen.. 1 Once Daily 10)  Fish Oil Concentrate 1000 Mg Caps (Omega-3 Fatty Acids) .... One By Mouth Two Times A Day 11)  Cafgesic 325-250-20-50 Mg Caps (Apap-Salicyl-Phenyltolox-Caff) .... One By Mouth Q 4-6 Hour As Needed Pain 12)  Cyclobenzaprine Hcl 10 Mg Tabs (Cyclobenzaprine Hcl) .... One By Mouth Three Times A Day As Needed Neck Pain 13)  Alprazolam 0.5 Mg Tabs (Alprazolam) .... 1/2 To 1 By Mouth Q 6 Hours  As Needed For Anxiety 14)  Effexor Xr 37.5 Mg Xr24h-Cap (Venlafaxine Hcl) .Marland Kitchen.. 1 Once Daily Along With 150 Once Daily Total of 187.5  Allergies (verified): 1)  ! Zithromax 2)  ! Ace Inhibitors 3)  ! Codeine  Past History:  Family History: Last updated: 11/13/2006 mother CVA at 85 father CA  ( GI liver CA) 43  Social History: Last updated: 07/30/2006 Retired Married Former Smoker  Risk Factors: Smoking Status: quit (01/29/2010)  Past medical, surgical, family and social histories (including risk factors) reviewed, and no changes noted  (except as noted below).  Past Medical History: Reviewed history from 11/05/2007 and no changes required. Asthma Depression Hypertension b 12 def ( obesity surgery) Anemia-NOS Hypothyroidism  Past Surgical History: Reviewed history from 11/23/2009 and no changes required. TAH + O Hysterectomy Weight loss reduction sx Lumbar laminectomy L3-4 Pantailor athrodesis with rod placement left foot  Family History: Reviewed history from 11/13/2006 and no changes required. mother CVA at 23 father CA  ( GI liver CA) 45  Social History: Reviewed history from 07/30/2006 and no changes required. Retired Married Former Smoker  Review of Systems       The patient complains of weight gain, difficulty walking, and depression.  The patient denies anorexia, fever, weight loss, vision loss, decreased hearing, hoarseness, chest pain, syncope, dyspnea on exertion, peripheral edema, prolonged cough, headaches, hemoptysis, abdominal pain, melena, hematochezia,  severe indigestion/heartburn, hematuria, incontinence, genital sores, muscle weakness, suspicious skin lesions, transient blindness, unusual weight change, abnormal bleeding, enlarged lymph nodes, angioedema, and breast masses.    Physical Exam  General:  overweight-appearing.  alert.   Head:  atraumatic.  normocephalic.   Eyes:  pupils equal and pupils round.   Ears:  R ear normal and L ear normal.  R ear normal and L ear normal.   Nose:  no external deformity.  no external deformity.   Mouth:  pharynx pink and moist and no erythema.   Neck:  supple and full ROM.  supple and full ROM.   Lungs:  normal respiratory effort and normal breath sounds.  Mild increase in I/E ratio. Peak flow 300  Heart:  normal rate and regular rhythm.  normal rate and regular rhythm.    Diabetes Management Exam:    Eye Exam:       Eye Exam done elsewhere          Date: 10/24/2009          Results: normal          Done by: Ginette Otto  opthamology   Impression & Recommendations:  Problem # 1:  HYPOTHYROIDISM (ICD-244.9) Assessment Unchanged  revied resumed from nov for rehab with pt stay on does Her updated medication list for this problem includes:    Armour Thyroid 120 Mg Tabs (Thyroid) .Marland Kitchen... 1 once daily  Labs Reviewed: TSH: 0.17 (05/19/2009)   Free T4: 0.8 (09/09/2007)    HgBA1c: 6.2 (11/23/2009) Chol: 202 (05/19/2009)   HDL: 44.20 (05/19/2009)   LDL: DEL (01/05/2008)   TG: 164 (01/05/2008)  Orders: TLB-TSH (Thyroid Stimulating Hormone) (84443-TSH) TLB-T3, Free (Triiodothyronine) (84481-T3FREE) TLB-T4 (Thyrox), Free [phone])  Problem # 2:  HYPERTENSION (ICD-401.9) Assessment: Unchanged  Orders: TLB-BMP (Basic Metabolic Panel-BMET) (80048-METABOL) Venipuncture (40981)  BP today: 140/90 Prior BP: 130/80 (11/23/2009)  Prior 10 Yr Risk Heart Disease: Not enough information (02/13/2007)  Labs Reviewed: K+: 5.3 (09/19/2009) Creat: : 1.1 (09/19/2009)   Chol: 202 (05/19/2009)   HDL: 44.20 (05/19/2009)   LDL: DEL (01/05/2008)   TG: 164 (01/05/2008)  Problem # 3:  HYPERLIPIDEMIA (ICD-272.4)  Labs Reviewed: SGOT: 30 (03/10/2009)   SGPT: 34 (03/10/2009)  Lipid Goals: Chol Goal: 200 (01/04/2009)   HDL Goal: 40 (01/04/2009)   LDL Goal: 100 (01/04/2009)   TG Goal: 150 (01/04/2009)  Prior 10 Yr Risk Heart Disease: Not enough information (02/13/2007)   HDL:44.20 (05/19/2009), 35.10 (01/04/2009)  LDL:DEL (01/05/2008), DEL (06/24/2006)  Chol:202 (05/19/2009), 200 (01/04/2009)  Trig:164 (01/05/2008), 208 (06/24/2006)  Problem # 4:  DM (ICD-250.00) Assessment: Deteriorated  Orders: TLB-A1C / Hgb A1C (Glycohemoglobin) (83036-A1C)  Labs Reviewed: Creat: 1.1 (09/19/2009)     Last Eye Exam: normal (10/24/2009) Reviewed HgBA1c results: 6.2 (11/23/2009)  6.5 (09/19/2009)  Complete Medication List: 1)  Singulair 10 Mg Tabs (Montelukast sodium) .... Take 1 tablet by mouth once a day 2)  Diclofenac Sodium  75 Mg Tbec (Diclofenac sodium) .... Take 1 tablet by mouth two times a day 3)  Tandem Plus 162-115.2-1 Mg Caps (Fefum-fepo-fa-b cmp-c-zn-mn-cu) .Marland Kitchen.. 1 two times a day 4)  Fluticasone Propionate 50 Mcg/act Susp (Fluticasone propionate) .... Two sprays q nare daily 5)  Cobal-1000 1000 Mcg/ml Inj Soln (Cyanocobalamin) .Marland Kitchen.. 1cc im  monthly 6)  D 1000 Plus Tabs (Fa-cyanocobalamin-b6-d-ca) .Marland Kitchen.. 1 two times a day 7)  Precision Xtra Blood Glucose Strp (Glucose blood) .... Check sugars daily 8)  Armour Thyroid 120 Mg Tabs (Thyroid) .Marland Kitchen.. 1 once daily 9)  Fish Oil Concentrate 1000 Mg Caps (Omega-3 fatty acids) .... One by mouth two times a day 10)  Cafgesic 325-250-20-50 Mg Caps (Apap-salicyl-phenyltolox-caff) .... One by mouth q 4-6 hour as needed pain 11)  Cyclobenzaprine Hcl 10 Mg Tabs (Cyclobenzaprine hcl) .... One by mouth three times a day as needed neck pain 12)  Alprazolam 0.5 Mg Tabs (Alprazolam) .... 1/2 to 1 by mouth q 6 hours  as needed for anxiety 13)  Venlafaxine Hcl 150 Mg Xr24h-tab (Venlafaxine hcl) .... One by mouth day  Other Orders: TLB-CBC Platelet - w/Differential (85025-CBCD) TLB-IBC Pnl (Iron/FE;Transferrin) (83550-IBC)  Hypertension Assessment/Plan:      The patient's hypertensive risk group is category C: Target organ damage and/or diabetes.  Today's blood pressure is 140/90.  Her blood pressure goal is < 130/80.  Patient Instructions: 1)  Please schedule a follow-up appointment in 3 months. Prescriptions: VENLAFAXINE HCL 150 MG XR24H-TAB (VENLAFAXINE HCL) one by mouth day  #90 x 3   Entered and Authorized by:   Stacie Glaze MD   Signed by:   Stacie Glaze MD on 01/29/2010   Method used:   Electronically to        [facility]* (retail)             ,          Ph: [phone]       Fax: [phone]   RxID:   [id number] ARMOUR THYROID 120 MG TABS (THYROID) 1 once daily  #90 x 3   Entered by:   Willy Eddy, LPN   Authorized by:   Stacie Glaze MD    Signed by:   Willy Eddy, LPN on 59/56/3875   Method used:   Electronically to        [facility]* (retail)             ,          Ph: [phone]       Fax: [phone]   RxID:   [id number] TANDEM PLUS 162-115.2-1 MG CAPS (FEFUM-FEPO-FA-B CMP-C-ZN-MN-CU) 1 two times a day  #180 Capsule x 3   Entered by:   Willy Eddy, LPN   Authorized by:   Stacie Glaze MD   Signed by:   Willy Eddy, LPN on 42/70/6237   Method used:   Electronically to        [id number]* (retail)       [address]       [place], [place]  [address]       Ph: [phone]       Fax: [phone]   RxID:   [id number] COBAL-1000 1000 MCG/ML INJ SOLN (CYANOCOBALAMIN) 1cc IM  monthly  #10 [place] x 1   Entered by:   Willy Eddy, LPN   Authorized by:   Stacie Glaze MD   Signed by:   Willy Eddy, LPN on 37/16/9678   Method used:   Electronically to        [facility] [address] (retail)       [phone]       [place], [place]  [address]       Ph: [phone]       Fax: [phone]   RxID:   [id number]    Orders Added: 1)  TLB-TSH (Thyroid Stimulating Hormone) [84443-TSH] 2)  TLB-T3, Free (Triiodothyronine) [67124-P8KDXI] 3)  TLB-T4 (  Thyrox), Free [40981-XB1Y] 4)  TLB-A1C / Hgb A1C (Glycohemoglobin) [83036-A1C] 5)  TLB-BMP (Basic Metabolic Panel-BMET) [80048-METABOL] 6)  Venipuncture [36415] 7)  Est. Patient Level IV [78295] 8)  TLB-CBC Platelet - w/Differential [85025-CBCD] 9)  TLB-IBC Pnl (Iron/FE;Transferrin) [83550-IBC]  Appended Document: Orders Update    Clinical Lists Changes  Orders: Added new Service order of Specimen Handling (62130) - Signed

## 2010-03-06 ENCOUNTER — Encounter (HOSPITAL_COMMUNITY)
Admission: RE | Admit: 2010-03-06 | Discharge: 2010-03-06 | Disposition: A | Discharge status: Home / Self Care | Payer: Medicare Other | Source: Ambulatory Visit | Attending: Orthopedic Surgery | Admitting: Orthopedic Surgery

## 2010-03-06 DIAGNOSIS — Z01812 Encounter for preprocedural laboratory examination: Secondary | ICD-10-CM | POA: Insufficient documentation

## 2010-03-06 LAB — CBC
HCT: 41.7 % (ref 36.0–46.0)
Hemoglobin: 13.6 g/dL (ref 12.0–15.0)
MCH: 28.2 pg (ref 26.0–34.0)
MCHC: 32.6 g/dL (ref 30.0–36.0)
MCV: 86.3 fL (ref 78.0–100.0)
RDW: 15 % (ref 11.5–15.5)
WBC: 5.5 10*3/uL (ref 4.0–10.5)

## 2010-03-06 LAB — BASIC METABOLIC PANEL
BUN: 11 mg/dL (ref 6–23)
Calcium: 10.4 mg/dL (ref 8.4–10.5)
Chloride: 104 mEq/L (ref 96–112)
GFR calc Af Amer: >60 mL/min (ref >60)
GFR calc non Af Amer: 60 mL/min — ABNORMAL LOW (ref >60)
Glucose, Bld: 165 mg/dL — ABNORMAL HIGH (ref 70–99)
Potassium: 4.6 mEq/L (ref 3.5–5.1)

## 2010-03-06 LAB — SURGICAL PCR SCREEN: MRSA, PCR: NEGATIVE

## 2010-03-09 ENCOUNTER — Inpatient Hospital Stay (HOSPITAL_COMMUNITY): Payer: Medicare Other

## 2010-03-09 ENCOUNTER — Ambulatory Visit (HOSPITAL_COMMUNITY)
Admission: RE | Admit: 2010-03-09 | Discharge: 2010-03-12 | DRG: 909 | Disposition: A | Discharge status: Skilled Nursing Facility | Payer: Medicare Other | Source: Ambulatory Visit | Attending: Orthopedic Surgery | Admitting: Orthopedic Surgery

## 2010-03-09 DIAGNOSIS — Y834 Other reconstructive surgery as the cause of abnormal reaction of the patient, or of later complication, without mention of misadventure at the time of the procedure: Secondary | ICD-10-CM | POA: Insufficient documentation

## 2010-03-09 DIAGNOSIS — F3289 Other specified depressive episodes: Secondary | ICD-10-CM | POA: Insufficient documentation

## 2010-03-09 DIAGNOSIS — E039 Hypothyroidism, unspecified: Secondary | ICD-10-CM | POA: Insufficient documentation

## 2010-03-09 DIAGNOSIS — E119 Type 2 diabetes mellitus without complications: Secondary | ICD-10-CM | POA: Insufficient documentation

## 2010-03-09 DIAGNOSIS — F329 Major depressive disorder, single episode, unspecified: Secondary | ICD-10-CM | POA: Insufficient documentation

## 2010-03-09 DIAGNOSIS — J45909 Unspecified asthma, uncomplicated: Secondary | ICD-10-CM | POA: Insufficient documentation

## 2010-03-09 DIAGNOSIS — M79609 Pain in unspecified limb: Secondary | ICD-10-CM | POA: Insufficient documentation

## 2010-03-09 DIAGNOSIS — E669 Obesity, unspecified: Secondary | ICD-10-CM | POA: Insufficient documentation

## 2010-03-09 DIAGNOSIS — IMO0002 Reserved for concepts with insufficient information to code with codable children: Secondary | ICD-10-CM | POA: Diagnosis present

## 2010-03-09 LAB — GLUCOSE, CAPILLARY
Glucose-Capillary: 174 mg/dL — ABNORMAL HIGH (ref 70–99)
Glucose-Capillary: 127 mg/dL — ABNORMAL HIGH (ref 70–99)
Glucose-Capillary: 131 mg/dL — ABNORMAL HIGH (ref 70–99)

## 2010-03-10 LAB — GLUCOSE, CAPILLARY: Glucose-Capillary: 161 mg/dL — ABNORMAL HIGH (ref 70–99)

## 2010-03-11 LAB — GLUCOSE, CAPILLARY
Glucose-Capillary: 121 mg/dL — ABNORMAL HIGH (ref 70–99)
Glucose-Capillary: 148 mg/dL — ABNORMAL HIGH (ref 70–99)
Glucose-Capillary: 130 mg/dL — ABNORMAL HIGH (ref 70–99)
Glucose-Capillary: 131 mg/dL — ABNORMAL HIGH (ref 70–99)

## 2010-03-12 LAB — GLUCOSE, CAPILLARY: Glucose-Capillary: 104 mg/dL — ABNORMAL HIGH (ref 70–99)

## 2010-03-12 LAB — TISSUE CULTURE: Culture: NO GROWTH

## 2010-03-12 NOTE — Op Note (Signed)
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  PROCEDURE: 1. Removal of deep implants from the left foot x3 (three separate     incisions). 2. Aspiration of bone marrow from the left iliac crest. 3. Revision arthrodesis of the left talonavicular joint. 4. Intraoperative interpretation of fluoroscopic images greater than 1     hour.  SURGEON:  Toni Arthurs, MD  ANESTHESIA:  General, regional.  IV FLUIDS:  See anesthesia record.  ESTIMATED BLOOD LOSS:  Minimal.  TOURNIQUET TIME:  Two hours and 5 minutes at 350 mmHg.  COMPLICATIONS:  None apparent.  DISPOSITION:  Extubated, awake, and stable to recovery.  INDICATIONS FOR PROCEDURE:  The patient is a 70 year old female who underwent left pantalar arthrodesis for a severe flatfoot deformity last year.  She went on to have a nonunion of the talonavicular joint with obvious lucency around all three of the screws as well as at the talonavicular joint itself.  She presents now for revision of this arthrodesis.  She understands the risks and benefits of the procedure as well as the alternative treatment options and elects to proceed.  PROCEDURE IN DETAIL:  After preoperative consent was obtained and the correct operative site was identified, the patient was brought to the operating room and placed supine on the operating table.  General anesthesia was induced.  Regional anesthesia had previously been administered.  A surgical time-out was taken.   Preoperative antibiotics were administered.  The left iliac crest was palpated.  It was then prepped and draped in standard sterile fashion.  A stab incision was made over the iliac crest about three fingerbreadths posterior from the ASIS.  The sharp aspiration trocar was inserted through the subcutaneous tissue to level of the iliac crest.  A mallet was used to tap it through the superior aspect of the cortical bone. The sharp trocar was then exchanged for a blunt trocar and the trocar was advanced approximately 6 cm.  The 60 mL syringe was then attached and general vacuum was applied. A 5 mL were aspirated and then the cannula was turned a quarter turn. This was repeated until 60 mL were aspirated from the iliac crest.  The trocar was removed.  A Steri-Strip was used to close the skin followed by sterile dressing.  The left lower extremity was then prepped and draped in standard sterile fashion with a tourniquet around the thigh. The previously aspirated 60 mL of bone marrow were passed off the field for concentration in the centrifuge.  The patient's previous dorsal incision was marked on the skin and a medial incision was marked over the talonavicular joint.  The extremity was exsanguinated and  the tourniquet was inflated.  Dorsal incision was made.  Sharp dissection was carried down through the skin.  Blunt dissection was then carried down through the scar tissue to the extensor hallucis longus tendon sheath, this was opened.  Blunt dissection was then carried down to the level of the talonavicular joint.  The soft tissues were released subperiosteally medially and laterally exposing the talonavicular joint which was noted to have a copious amount of fibrous tissue.  A sample of fibrous tissue was then resected with a rongeur and passed off the field as a specimen to Microbiology.  The central screw in the dorsal aspect of the navicular was identified.  A guidepin was inserted down  through the screw.  The screwdriver was then used to remove the screw in its entirety.  The lateral screw in the navicular was then found by percutaneously placing a pin into the head of the screw.  A separate incision was made laterally.  Blunt dissection was then carried down to the head of the screw.  The screwdriver was used to remove the screw in its entirety.  The medial screw was also found with this technique.  A third incision was made and blunt dissection was carried down to the head of the screw.  A curette was used to clear all soft tissue from the screw.  The screw was then removed in its entirety.  At this point, a lamina spreader was used to open the talonavicular joint.  All fibrous tissue was removed.  The wound was irrigated copiously.  A 3.5-mm drill bit was then used to perforate subchondral bone of both the head of the talus as well as the proximal articular surface of the navicular.  The medial incision was made and sharp dissection was carried down through the skin.  Blunt dissection was carried down to the level of the posterior tibial tendon sheath.  It was incised and the posterior tip was retracted plantarly.  The medial aspect of the joint was exposed. Again, the joint was cleared of all fibrous tissue on this side.  The guidepins for the DePuy compression device were then inserted into the head of the talus and the navicular medially.  The joint was compressed using the device.  Appropriate reduction was confirmed with AP and lateral fluoroscopic images.  A small closed compression plate was then applied from the DePuy foot set.  It was pinned into position distally appropriate position was verified on AP and lateral views.  Two locking screws were then inserted to secure the plate at the navicular distally. The proximal compression hole was then drilled eccentrically and a nonlocking screw was inserted.  It was noted to compress the joint appropriately.  The  proximal locking hole was drilled and filled with a locking screw.  Attention was turned to the medial incision. A separate locking plate was then inserted over the medial aspect of the talonavicular joint.  It was pinned into position.  The same procedure was repeated compressing the joint medially and locking the plate with three fully-threaded locking screws.  Prior to reducing the joint, the bone marrow aspirate had been mixed with approximately 7 mL of cancellus chips.  These were packed into the joint to fill up all of the holes. The remaining graft was now packed around the joint and impacted into position of the joint line.  Final AP and lateral views showed appropriate position of all hardware and appropriate reduction of the talonavicular joint.  The subcutaneous tissue  was closed with inverted simple sutures of 2-0 Vicryl and the skin was closed with a running 3-0 Prolene suture at both incisions.  Sterile dressings were applied followed by a well-padded short-leg cast.  Tourniquet was released at 2 hours and 5 minutes.  The patient was then awakened from anesthesia, extubated, transported to the recovery room in stable condition.  FOLLOWUP PLAN:  The patient will be nonweightbearing on the left lower extremity.  She will come into the hospital for observation for pain control.  We will see how she does with physical therapy and occupational therapy to determine whether she needs to go to any form of rehab facility.     Toni Arthurs, MD     JH/MEDQ  D:  03/09/2010  T:  03/10/2010  Job:  [id number]  Electronically Signed by Toni Arthurs  on 03/12/2010 09:05:12 AM

## 2010-03-13 NOTE — Discharge Summary (Signed)
  [name], [name]            ACCOUNT NO.:  [id number]  MEDICAL RECORD NO.:  [id number]           PATIENT TYPE:  I  LOCATION:  5024                         FACILITY:  [facility]  PHYSICIAN:  Toni Arthurs, MD        DATE OF BIRTH:  July 04, 1940  DATE OF ADMISSION:  03/09/2010 DATE OF DISCHARGE:  03/12/2010                              DISCHARGE SUMMARY   ADMISSION DIAGNOSES: 1. Left talonavicular nonunion status post pantalar arthrodesis. 2. Chronic obstructive pulmonary disease. 3. Hypothyroidism. 4. Depression.  DISCHARGE DIAGNOSES: 1. Left talonavicular nonunion status post pantalar arthrodesis. 2. Chronic obstructive pulmonary disease. 3. Hypothyroidism. 4. Depression. 5. Status post revision arthrodesis of the left talonavicular joints.  HISTORY OF PRESENT ILLNESS:  The patient is a 70 year old female with past medical history significant for hypothyroidism and asthma, who underwent pantalar arthrodesis last year for a severe flat foot deformity.  She went on to have a nonunion of her talonavicular joint. She presented on this admission for revision of her talonavicular arthrodesis.  HOSPITAL COURSE:  The patient was admitted to the hospital on March 09, 2010.  She was taken to Surgery that day where she underwent revision of her talonavicular joint arthrodesis.  She tolerated this procedure well and was admitted to the inpatient ward where she remained for the duration of her hospital stay.  Postoperatively, Physical Therapy and Occupational Therapy consults were obtained which recommended skilled nursing care for rehab.  She had an otherwise unremarkable hospital stay.  She is discharged today in good condition to [facility] for skilled nursing care.  DISCHARGE CONDITION:  Stable.  WEIGHTBEARING STATUS:  Nonweightbearing on the left lower extremity.  FOLLOWUP PLAN:  She will have Physical Therapy and Occupational Therapy as an inpatient at [facility].  She  will be nonweightbearing on the left lower extremity.  She will follow up with me in clinic on March 21, 2010.  [facility] should call [phone] to schedule this appointment.  DISCHARGE DIET:  Carb modified medium.  DISCHARGE MEDICATIONS: 1. Aspirin 325 mg once a day. 2. Colace 100 mg p.o. twice a day. 3. Norco 10/325 one to two every 4 hours as needed for pain. 4. Robaxin 500 mg p.o. q.6 h. p.r.n. muscle spasms. 5. Senokot 2 tablets p.o. b.i.d. 6. Albuterol inhaler 2 puffs every 4 hours as needed. 7. Armour Thyroid 180 mg p.o. daily. 8. Calcium and vitamin D 600 mg/400 International Units 1 p.o. b.i.d. 9. Effexor XR 150 mg p.o. daily. 10.Fish oil 2400 mg b.i.d. 11.Multivitamin 1 p.o. daily. 12.Singulair 10 mg p.o. daily. 13.Tandem Plus 2 tablets p.o. nightly. 14.Vitamin B12 one injection monthly. 15.Vitamin D3, 1000 units p.o. b.i.d. 16.Xanax 0.5 mg p.o. b.i.d. p.r.n. anxiety.     Toni Arthurs, MD     JH/MEDQ  D:  03/12/2010  T:  03/12/2010  Job:  [id number]  Electronically Signed by Toni Arthurs  on 03/13/2010 12:24:40 PM

## 2010-03-14 LAB — ANAEROBIC CULTURE

## 2010-04-04 LAB — BASIC METABOLIC PANEL
CO2: 30 mEq/L (ref 19–32)
CO2: 30 mEq/L (ref 19–32)
Calcium: 9.6 mg/dL (ref 8.4–10.5)
Chloride: 102 mEq/L (ref 96–112)
Creatinine, Ser: 0.91 mg/dL (ref 0.4–1.2)
Creatinine, Ser: 1 mg/dL (ref 0.4–1.2)
GFR calc Af Amer: >60 mL/min (ref >60)
GFR calc Af Amer: >60 mL/min (ref >60)
GFR calc non Af Amer: 55 mL/min — ABNORMAL LOW (ref >60)
Glucose, Bld: 152 mg/dL — ABNORMAL HIGH (ref 70–99)
Potassium: 4.7 mEq/L (ref 3.5–5.1)
Sodium: 138 mEq/L (ref 135–145)

## 2010-04-04 LAB — GLUCOSE, CAPILLARY
Glucose-Capillary: 121 mg/dL — ABNORMAL HIGH (ref 70–99)
Glucose-Capillary: 125 mg/dL — ABNORMAL HIGH (ref 70–99)
Glucose-Capillary: 133 mg/dL — ABNORMAL HIGH (ref 70–99)
Glucose-Capillary: 150 mg/dL — ABNORMAL HIGH (ref 70–99)
Glucose-Capillary: 158 mg/dL — ABNORMAL HIGH (ref 70–99)
Glucose-Capillary: 167 mg/dL — ABNORMAL HIGH (ref 70–99)
Glucose-Capillary: 234 mg/dL — ABNORMAL HIGH (ref 70–99)

## 2010-04-04 LAB — CBC
HCT: 42.2 % (ref 36.0–46.0)
Hemoglobin: 14.1 g/dL (ref 12.0–15.0)
MCH: 29.4 pg (ref 26.0–34.0)
MCH: 29.6 pg (ref 26.0–34.0)
MCHC: 32.5 g/dL (ref 30.0–36.0)
MCV: 88.7 fL (ref 78.0–100.0)
Platelets: 158 10*3/uL (ref 150–400)
Platelets: 186 10*3/uL (ref 150–400)
RBC: 3.5 MIL/uL — ABNORMAL LOW (ref 3.87–5.11)
RBC: 4.76 MIL/uL (ref 3.87–5.11)
WBC: 6 10*3/uL (ref 4.0–10.5)

## 2010-04-04 LAB — HEMOGLOBIN A1C: Hgb A1c MFr Bld: 6.3 % — ABNORMAL HIGH (ref <5.7)

## 2010-05-01 ENCOUNTER — Ambulatory Visit: Payer: Self-pay | Admitting: Internal Medicine

## 2010-05-24 ENCOUNTER — Other Ambulatory Visit: Payer: Self-pay | Admitting: *Deleted

## 2010-05-24 MED ORDER — VENLAFAXINE HCL ER 150 MG PO CP24: 150.0000 mg | ORAL_CAPSULE | Freq: Every day | ORAL | Status: DC

## 2010-05-29 ENCOUNTER — Encounter: Payer: Self-pay | Admitting: Internal Medicine

## 2010-06-05 NOTE — Discharge Summary (Signed)
NAMEBELA, [name]            ACCOUNT NO.:  [id number]   MEDICAL RECORD NO.:  [id number]          PATIENT TYPE:  OBV   LOCATION:  2013                         FACILITY:  [facility]   PHYSICIAN:  Raenette Rover. Felicity Coyer, MDDATE OF BIRTH:  06-Sep-1940   DATE OF ADMISSION:  09/20/2007  DATE OF DISCHARGE:  09/21/2007                               DISCHARGE SUMMARY   DIAGNOSES AT TIME OF DISCHARGE:  1. Atypical chest pain.  2. Hypertension.  3. Diabetes type 2.  4. Anxiety.  5. History of hypothyroid.  6. Dyslipidemia.   HISTORY OF PRESENT ILLNESS:  [name] is a 70 year old white female  who was admitted on September 20, 2007, with chief complaint of chest pain.  She has multiple risk factors and presented to the [facility] Emergency  Department with complaint of onset of chest pain on the day prior to  admission in the afternoon.  She initially noted that the pain was  relatively mild, which she rated 2/10 and was located at the left chest.  She was able to sleep, but the symptoms were still present on the  morning  when she wakened.  She then noted the symptoms became worse  while she was at church with radiation to the left upper extremity, jaw,  and neck.  It was worse with movement or with dry cough.  She also noted  significant stress in her personal life and she had been separated 6  months ago and is currently in the process of getting back together with  her ex-husband.  She was admitted to undergo serial cardiac enzymes and  rule out MI.   PAST MEDICAL HISTORY:  1. Hypertensive history.  2. Diabetes, type 2.  3. Dyslipidemia.  4. Depression.  5. Hypothyroid.  6. History of Roux-en-Y gastric bypass in 2006.   COURSE OF HOSPITALIZATION:  1. Atypical chest pain.  The patient was admitted.  She underwent      serial cardiac enzymes which were negative.  She did note some mild      chest tightness on the evening prior to discharge, which was      associated with anxiety and  relieved with Ativan.  She also reports      some anterior chest tenderness to palpation.  At this time, the      patient will be discharged to home with outpatient followup.  We      will defer further testing such as stress test to patient's primary      MD.  We will also continue aspirin at the time of discharge.  2. Hypertension, uncontrolled.  The beta blocker was added at the time      of discharge, it will be titrated upward as needed as an      outpatient.  3. Diabetes, type 2.  The patient's A1c is currently pending at the      time of this dictation.  Continue outpatient followup.   MEDICATIONS AT THE TIME OF DISCHARGE:  1. Effexor 225 mg p.o. daily.  2. Singular 10 mg p.o. daily as needed.  3. Armour Thyroid 212 mg  mg p.o. daily.  4. Albuterol nebs as needed.  5. Tylenol Allergy as needed.  6. Vitamin D 400 mg p.o. daily.  7. Crestor 5 mg p.o. daily.  8. Lopressor 25 mg tablets 1/2 tablet p.o. b.i.d.  9. Aspirin 325 mg p.o. daily.  10.Xanax 0.25 mg 1 tablet p.o. q.6 h. as needed for anxiety.   DISPOSITION:  The patient will be discharged to home.   FOLLOWUP:  She is instructed to follow up with Dr. Lovell Sheehan in 1-2 weeks  and contact the office for an appointment.  She is also instructed to  return to the ER should she develop worsening chest pain or shortness of  breath.   Greater than 30 minutes were spent on discharge planning.      Sandford Craze, NP      Raenette Rover. Felicity Coyer, MD  Electronically Signed    MO/MEDQ  D:  09/21/2007  T:  09/22/2007  Job:  [id number]   cc:   Stacie Glaze, MD

## 2010-06-06 ENCOUNTER — Encounter: Payer: Self-pay | Admitting: Internal Medicine

## 2010-06-06 ENCOUNTER — Ambulatory Visit (INDEPENDENT_AMBULATORY_CARE_PROVIDER_SITE_OTHER): Payer: Medicare Other | Admitting: Internal Medicine

## 2010-06-06 DIAGNOSIS — E119 Type 2 diabetes mellitus without complications: Secondary | ICD-10-CM

## 2010-06-06 DIAGNOSIS — F329 Major depressive disorder, single episode, unspecified: Secondary | ICD-10-CM

## 2010-06-06 DIAGNOSIS — I10 Essential (primary) hypertension: Secondary | ICD-10-CM

## 2010-06-06 DIAGNOSIS — E039 Hypothyroidism, unspecified: Secondary | ICD-10-CM

## 2010-06-06 DIAGNOSIS — R21 Rash and other nonspecific skin eruption: Secondary | ICD-10-CM

## 2010-06-06 DIAGNOSIS — D649 Anemia, unspecified: Secondary | ICD-10-CM

## 2010-06-06 DIAGNOSIS — E785 Hyperlipidemia, unspecified: Secondary | ICD-10-CM

## 2010-06-06 DIAGNOSIS — F3289 Other specified depressive episodes: Secondary | ICD-10-CM

## 2010-06-06 LAB — T4, FREE: Free T4: 0.71 ng/dL (ref 0.60–1.60)

## 2010-06-06 LAB — T3, FREE: T3, Free: 2.7 pg/mL (ref 2.3–4.2)

## 2010-06-06 MED ORDER — BETAMETHASONE DIPROPIONATE 0.05 % EX CREA: TOPICAL_CREAM | Freq: Two times a day (BID) | CUTANEOUS | Status: AC

## 2010-06-06 MED ORDER — THYROID 240 MG PO TABS: 240.0000 mg | ORAL_TABLET | Freq: Every day | ORAL | Status: DC

## 2010-06-06 NOTE — Progress Notes (Signed)
Subjective:    Patient ID: [name], female    DOB: 1940-07-04, 70 y.o.   MRN: [id number]  HPI patient is followed for hypothyroidism and is currently on an increased dose of Armour Thyroid she presents today for T3-T4 and TSH for monitoring.  She has had a injury to her left foot with fracture repair with screws and plates she has itching on her leg which may be a mild allergic reaction we discussed the use of a topical betamethasone    Review of Systems  Constitutional: Negative for activity change, appetite change and fatigue.  HENT: Negative for ear pain, congestion, neck pain, postnasal drip and sinus pressure.   Eyes: Negative for redness and visual disturbance.  Respiratory: Negative for cough, shortness of breath and wheezing.   Gastrointestinal: Negative for abdominal pain and abdominal distention.  Genitourinary: Negative for dysuria, frequency and menstrual problem.  Musculoskeletal: Negative for myalgias, joint swelling and arthralgias.  Skin: Negative for rash and wound.  Neurological: Negative for dizziness, weakness and headaches.  Hematological: Negative for adenopathy. Does not bruise/bleed easily.  Psychiatric/Behavioral: Negative for sleep disturbance and decreased concentration.   Past Medical History  Diagnosis Date  . Asthma   . Depression   . Hypertension   . Vitamin B 12 deficiency   . Anemia   . Thyroid disease    Past Surgical History  Procedure Date  . Abdominal hysterectomy   . Oophorectomy   . Bariatric surgery   . Pantallor arthrodesis with rod placement left foot     reports that she has quit smoking. She does not have any smokeless tobacco history on file. She reports that she drinks about 1.2 ounces of alcohol per week. Her drug history not on file. family history includes Cancer in her father; Liver cancer in her father; and Stroke in her mother. Allergies  Allergen Reactions  . Ace Inhibitors   . Azithromycin     REACTION: Rash   . Codeine     REACTION: Upset stomach       Objective:   Physical Exam  Constitutional: She is oriented to person, place, and time. She appears well-developed and well-nourished. No distress.  HENT:  Head: Normocephalic and atraumatic.  Right Ear: External ear normal.  Left Ear: External ear normal.  Nose: Nose normal.  Mouth/Throat: Oropharynx is clear and moist.  Eyes: Conjunctivae and EOM are normal. Pupils are equal, round, and reactive to light.  Neck: Normal range of motion. Neck supple. No JVD present. No tracheal deviation present. No thyromegaly present.  Cardiovascular: Normal rate, regular rhythm, normal heart sounds and intact distal pulses.   No murmur heard. Pulmonary/Chest: Effort normal and breath sounds normal. She has no wheezes. She exhibits no tenderness.  Abdominal: Soft. Bowel sounds are normal.  Musculoskeletal: Normal range of motion. She exhibits no edema and no tenderness.  Lymphadenopathy:    She has no cervical adenopathy.  Neurological: She is alert and oriented to person, place, and time. She has normal reflexes. No cranial nerve deficit.  Skin: Skin is warm and dry. She is not diaphoretic.  Psychiatric: She has a normal mood and affect. Her behavior is normal.  She is wearing a left ankle brace        Assessment & Plan:  Onto her thyroid with a TSH T3 free and T4 free to discuss her relationship with her husband with counseling.  30 minutes was face-to-face counseling about depression and anxiety due to her divorce and the frustration  in dealing with her ex  Topical betamethasone cream for her itching prescription sent to [facility] on the window for

## 2010-06-08 NOTE — Discharge Summary (Signed)
   NAME:  [name], [name]                      ACCOUNT NO.:  [id number]   MEDICAL RECORD NO.:  [id number]                   PATIENT TYPE:  INP   LOCATION:  3017                                 FACILITY:  [facility]   PHYSICIAN:  Payton Doughty, M.D.                   DATE OF BIRTH:  Jul 21, 1940   DATE OF ADMISSION:  08/14/2001  DATE OF DISCHARGE:  08/19/2001                                 DISCHARGE SUMMARY   ADMITTING DIAGNOSIS:  Intractable postoperative back pain.   HISTORY OF PRESENT ILLNESS:  This is a 70 year old right-handed white lady  whose history and physical is recounted in the chart.  She had an operation  five days before and developed intractable pain in her back and her legs.  She said she could not walk.  She came to the emergency room and underwent  an MR that showed a small amount of residual blood in the anterior epidural  space.  No evidence of overt compression.  She was admitted for pain  control.   HOSPITAL COURSE:  She was placed on a PCA and Neurontin and over several  days her pain diminished.  She is now able to be up and ambulate with a  walker and somewhat independently.  She was fairly demanding while she was  in the hospital but seemed to have all her needs met.  She has a shower  chair, three-in-one, and a rolling walker ordered for her home use.   PHYSICAL EXAMINATION:  On exam currently, her strength is full.  There is no  evidence of infection.  Her vital signs are stable.   DISCHARGE MEDICATIONS:  She is being discharged home on Neurontin and  Percocet.   FOLLOW UP:  Her followup will be in the [facility]  office with Dr. Newell Coral on August 11.                                                 Payton Doughty, M.D.    MWR/MEDQ  D:  08/19/2001  T:  08/24/2001  Job:  [id number]

## 2010-06-08 NOTE — Consult Note (Signed)
   NAMEJIMESHA, RISING NO.:  [id number]   MEDICAL RECORD NO.:  [id number]                   PATIENT TYPE:   LOCATION:                                       FACILITY:   PHYSICIAN:  Shan Levans, M.D. [facility]            DATE OF BIRTH:  04-02-1940   DATE OF CONSULTATION:  DATE OF DISCHARGE:                                   CONSULTATION   CHIEF COMPLAINT:  Upper airway obstruction and respiratory distress  postoperatively.   HISTORY OF PRESENT ILLNESS:  The patient is a 70 year old obese white female  with history of hypertension, diabetes mellitus with use of an ace inhibitor  and also history of asthma. She underwent lumbar decompression and fusion  this afternoon per Dr. Newell Coral. Postoperatively upon extubation the patient  developed throat closure and also some bronchospasms. She has had two  separate attacks. Now somewhat better after Albuterol treatment. She denies  any reflux symptoms.   PAST MEDICAL HISTORY:  Past medical history of hypothyroidism, hypertension,  asthma, diabetes mellitus, and depression.   MEDICATIONS:  1. Lisinopril 10 mg daily.  2. Albuterol inhaler as needed.  3. Nasacort as needed.  4. Effexor XR 300 mg each day.  5. Synthroid 175 mcg daily.  6. Singulair 10 mg daily.  7. Glucovance 2.5/500 mg twice a day.  8. Vioxx 25 mg daily.   ALLERGIES:  No known drug allergies.   REVIEW OF SYSTEMS:  Noncontributory.   PHYSICAL EXAMINATION:  VITAL SIGNS: Blood pressure 160/80, pulse 80, sat  100% on 2 liters. Temperature 98.  CHEST:  Equal breath sounds. There is some pseudo wheeze and upper airway  stridor. Hoarseness noted in the voice. Few scattered peripheral wheezes are  noted as well.                                               Shan Levans, M.D. [facility]    PW/MEDQ  D:  12/03/2001  T:  12/04/2001  Job:  [id number]   cc:   Hewitt Shorts, M.D.

## 2010-06-08 NOTE — Op Note (Signed)
[name], [name]            ACCOUNT NO.:  [id number]   MEDICAL RECORD NO.:  [id number]          PATIENT TYPE:  INP   LOCATION:  0151                         FACILITY[facility]   PHYSICIAN:  Vikki Ports, MDDATE OF BIRTH:  09-07-1940   DATE OF PROCEDURE:  12/04/2004  DATE OF DISCHARGE:                                 OPERATIVE REPORT   PROCEDURE:  Multiple upper endoscopies.   SURGEON:  Vikki Ports, MD.   PROCEDURE:  During and at the conclusion of revision laparoscopic roux-en-Y  gastrojejunostomy gastric bypass.   On multiple occasions, the Olympus endoscope was introduced into the  previously stapled pouch. After a prolonged dissection in the upper abdomen  and exposing the previously stapled pouch, the endoscope was introduced  showing a very limited pouch which was unable to be stapled into a pouch  showing only a 2 cm pouch proximal to the previous staple line. Feeling that  we could not staple above this, Dr. Daphine Deutscher opted to perform a lower pouch  distal to the staple line. There was another outlet that we were able to  enter into the distal stomach. This was entered on multiple attempts with  the endoscope. Of note, endoscopy was very difficult and took probably at  least an hour with multiple attempts. At the conclusion of revision  gastrojejunostomy, the scope was entered into the small bowel into the new  gastrojejunostomy. In total, probably 7 endoscopies were performed. There  was a patent anastomosis with no evidence of obstruction. The pouch and  small bowel was decompressed at the completion of the procedure.      Vikki Ports, MD  Electronically Signed     KRH/MEDQ  D:  12/05/2004  T:  12/05/2004  Job:  [id number]

## 2010-06-08 NOTE — H&P (Signed)
NAME:  [name], [name]                      ACCOUNT NO.:  [id number]   MEDICAL RECORD NO.:  [id number]                   PATIENT TYPE:   LOCATION:                                       FACILITY:  [facility]   PHYSICIAN:  Hewitt Shorts, M.D.            DATE OF BIRTH:  01/04/1941   DATE OF ADMISSION:  12/03/2001  DATE OF DISCHARGE:                                HISTORY & PHYSICAL   HISTORY OF PRESENT ILLNESS:  The patient is a 70 year old right-handed white  female who was evaluated last spring for a right lumbar radiculopathy.  Her  symptoms had begun in November of 2002 when she injured herself at work.  She was taken to surgery in July of this year for a right L4-5 lumbar  laminotomy and microdiscectomy.  The patient initially did well but  developed recurrent right lumbar radicular pain which has become  incapacitating with pain into the buttock, lateral right hip, thigh and leg.  She was evaluated with x-rays and MRI scan.  She was treated with Neurontin  and Vioxx.  She did not feel that the Neurontin helped her any.  X-rays  revealed dynamic listhesis at L4-5 with an anterolisthesis at L4-5 of about  10 mm in extended position which increases to 10 mm in flexion.  MRI scan  shows post surgical change in the right side at L4-5 with advanced facet  degeneration and foraminal encroachment bilaterally at L4-5.  Interestingly,  the patient began to use her lumbar corset that she was given at the time of  her previous surgery and has had less radicular pain suggesting that the  elimination of motion, or at least the reduction of motion with the corset  helped her reduce the pain and discomfort that she was experiencing.   The patient is being admitted now for right L4-5 discectomy, transverse  posterior lumbar inner body fusion and bilateral L4-5 posterior lateral  arthrodesis.   PAST MEDICAL HISTORY:  This is notable for history of hypertension, treated  for the past year and  a half.  History of type 2 diabetes the past two and a  half years.  History of asthma.  She did not describe any history of  myocardial infarction, cancer, stroke or peptic ulcer disease.   PAST SURGICAL HISTORY:  Lumbar surgery in July of 2003, hysterectomy,  gastroplication, and tonsillectomy and adenoidectomy.   ALLERGIES:  No known drug allergies.   CURRENT MEDICATIONS:  1. Vioxx 25 mg q.d.  2. Glucovance 2.5/500 b.i.d.  3. Singulair 10 mg q.d.  4. Synthroid 175 mcg q.d.  5. Effexor XR 300 mg q.d.  6. Lisinopril 10 mg q.d.  7. Albuterol two puffs q.i.d. p.r.n.  8. Nasacort AQ two puffs q.d.   FAMILY HISTORY:  Father died of cancer, mother is in good health at age 60.  She has two siblings with diabetes.   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  She does not smoke.  She does  drink alcohol socially.  She denies any history of substance abuse.   REVIEW OF SYSTEMS:  Notable for that described in history of present illness  and past medical history.  For 14-point review of systems, she is otherwise  unremarkable.   PHYSICAL EXAMINATION:  GENERAL APPEARANCE:  The patient is an obese white  female in no acute distress.  VITAL SIGNS:  Temperature 98.3, pulse 76, blood pressure 152/83, respiratory  rate 16, height 5 feet 4-1/2 inches, weight 278 pounds.  LUNGS:  Clear to auscultation.  She has symmetric respiratory excursion.  CARDIOVASCULAR:  Regular rate and rhythm, normal S1 and S2.  There is no  murmur.  ABDOMEN:  Obese, nondistended, bowel sounds are present.  EXTREMITIES:  There is no clubbing, cyanosis, or edema.  MUSCULOSKELETAL:  There is a well-healed lumbar incision.  NEUROLOGIC:  Intact strength and sensation through the lower extremities.  Reflexes are minimal to quadriceps and gastrocnemius.  They are symmetric  bilaterally.  Toes are downgoing bilaterally.   IMPRESSION:  The patient  with recurrent right lumbar radiculopathy secondary  to advanced degenerative disc disease with spondylosis with resulting  dynamic instability of lumbar spine at the L4-5 level.   PLAN:  The patient will be admitted for right L4-5 lumbar laminotomy,  facetectomy, microdiscectomy and transverse posterior lumbar inner body  fusion with bilateral L4-5 posterolateral arthrodesis with bone graft and  posterior instrumentation.  We discussed the nature of the surgery, length  of hospital stay for recuperation, need for postoperative immobilization in  a lumbar corset and risks of surgery if there was infection which would  increase due to her diabetes, bleeding, possible transfusion.  We did  explain to her that we would use Cell Saver during the surgery.  Risk of  nerve dysfunction with pain, weakness, numbness or paresthesias, risk of  dural tear and CSF leakage.  Possible need for another surgery, risk of  failure of the arthrodesis, possible need for another surgery, anesthetic  risks and myocardial infarction, stroke, pneumonia and death.  She  understands that because of her obesity and diabetes, she is at  significantly greater risk that a patient without those conditions.  Understanding all of this, she does wish to go ahead with surgery and is  admitted for such.                                                Hewitt Shorts, M.D.    RWN/MEDQ  D:  12/03/2001  T:  12/03/2001  Job:  [id number]

## 2010-06-08 NOTE — H&P (Signed)
[facility]  Patient:    [name], [name] Visit Number: [id number] MRN: [id number]          Service Type: SUR Location: 3000 3017 01 Attending Physician:  Emeterio Reeve Dictated by:   Payton Doughty, M.D. Admit Date:  08/14/2001                           History and Physical  INTERIM ADMIT NOTE  SERVICE:  Neurosurgery.  HISTORY OF PRESENT ILLNESS:  This is a 70 year old right-handed white lady who had a lumbar disk done five days ago by Dr. Hewitt Shorts.  She went home the next day.  Her daughter called last night and said she was having pain in her back, down her right leg and could not walk.  I had her come to [facility] this morning because they did not want her to lie in the emergency room all night, and she had an MRI.  To my inspection, the MRI does not show a recurrent disk, although interpretation is somewhat difficult because of the scarring and residual blood.  PAST MEDICAL AND SURGICAL HISTORY:  Her medical and surgical history are all in Dr. Gae Dry admission note from earlier this week and they are essentially unchanged.  PHYSICAL EXAMINATION:  GENERAL:  On exam, she has complaints of right hip and leg pain and radiates from her back and keeps her from walking.  NEUROLOGIC:  Her strength is full to confrontational testing.  There is no sensory deficit and her reflexes are intact.  Straight leg raise is negative.  CLINICAL IMPRESSION:  Right lumbar radicular pain without deficit.  PLAN:  She is going to be admitted and review the MRI with the radiologist and try to get her on some Neurontin for pain control. Dictated by:   Payton Doughty, M.D. Attending Physician:  Emeterio Reeve DD:  08/14/01 TD:  08/18/01 Job: [id number]

## 2010-06-08 NOTE — Op Note (Signed)
NAME:  [name], [name]                      ACCOUNT NO.:  [id number]   MEDICAL RECORD NO.:  [id number]                   PATIENT TYPE:  INP   LOCATION:  3172                                 FACILITY:  [facility]   PHYSICIAN:  Hewitt Shorts, M.D.            DATE OF BIRTH:  [birth year]   DATE OF PROCEDURE:  12/03/2001  DATE OF DISCHARGE:                                 OPERATIVE REPORT   PREOPERATIVE DIAGNOSIS:  Lumbar dynamic instability with anterolisthesis at  L4-5, lumbar spondylosis, degenerative disk disease, and lumbar  radiculopathy.   POSTOPERATIVE DIAGNOSES:  1. Lumbar dynamic instability with anterolisthesis at L4-5, lumbar     spondylosis, degenerative disk disease, and lumbar radiculopathy.  2. Recurrent disk herniation.   PROCEDURES:  Right L4-5 lumbar laminotomies, facetectomy, microdiskectomy,  and transverse posterior lumbar interbody fusion with split femoral ring  allograft and Vitoss with bone marrow aspirate, and a bilateral L4-5  posterolateral arthrodesis with M-10 posterior instrumentation and Vitoss  with bone marrow aspirate.   SURGEON:  Hewitt Shorts, M.D.   ASSISTANT:  Stefani Dama, M.D.   ANESTHESIA:  General endotracheal.   INDICATION FOR PROCEDURE:  The patient is a 70 year old woman who presented  with recurrent right lumbar radiculopathy and was found to have dynamic  instability at the L4-5 level, and a decision was made to proceed with  elective decompression and arthrodesis.  The procedure was done with the  assistance of intraoperative stereotaxis.   DESCRIPTION OF PROCEDURE:  The patient was brought to the operating room and  placed under general endotracheal anesthesia.  The patient was turned to a  prone position, and the lumbar region was prepped with Betadine soap and  solution and draped in a sterile fashion.  The line of the incision was  infiltrated with local anesthetic with epinephrine, an x-ray was taken to  localize  the L4-5 level, and then a midline incision was made through the  previous midline incision and extended both rostrally and caudally.  Bipolar  electrocautery utilized to maintain hemostasis.  Dissection was carried down  to the lumbar fascia, which was incised bilaterally, and the paraspinal  muscles were dissected from the spinous processes and laminae in a  subperiosteal fashion.  An x-ray was taken to localize the L4-5 interlaminar  space, and we exposed the left L4 and L5 laminae and facet complex and then  carefully dissected the scar tissue within the previous right L4-5  laminotomy.  We dissected laterally, exposing the right L4-5 facet complex  and also the transverse processes of both L4 and L5 on the right side.  The  microscope was then draped and brought into the field to provide additional  magnification, illumination, and visualization, and the decompression was  performed using microdissection and microsurgical technique.  There was  extensive scar tissue within the laminotomy defect.  This was carefully  dissected away.  The facet joints did not appear  to be intact and were  scarred down and spondylitic.  This was carefully removed.  We identified  the right L4 and L5 nerve roots as well as the lateral margin of the thecal  sac.  We then identified the right L4-5 annulus.  A subligamentous recurrent  disk herniation was noted.  We entered into the disk space by incising the  annulus and using a variety of pituitary rongeurs.  A thorough diskectomy  was performed, and then the end plates of the vertebral body surfaces were  curetted so as to remove the end plate cartilage down to the bony surface.  We then took a piece of femoral ring 12 mm in height.  It was cut using the  oscillating saw and then shaped using the [facility] Max drill to a height of 11  mm, and using a laminar distractor we were able to create gentle  distraction, and the graft was impacted into the disk space  and turned in a  transverse fashion.  Later the disk space was packed around the graft with  Vitoss that had been soaked in bone marrow aspirate.  We then further  examined the right L4 nerve root.  It was felt to be well-decompressed.  All  the spondylitic overgrowth that had been compressing it had been removed,  and we proceeded on with the posterolateral arthrodesis.  Using the [facility]  stereotaxis system along with x-ray, we were able to localize the pedicle  entry sites at L4 and L5 bilaterally.  The overlying cortex was perforated,  and we were able to pass a pedicle probe into the pedicle and the vertebral  body.  A ball probe was used to examine the exposure as well as x-ray.  Good  bone was felt in all planes and directions.  Each of the pedicles was then  tapped with a 5.5 tap.  We again examined the pedicles with a ball probe.  Good threading without any cut-outs was noted, and we had placed 6.5 x 45 mm  screws at each level.  X-rays were taken.  One screw was adjusted.  Another  x-ray was taken and showed all four screws in good position.  An AP C-arm  fluoroscopy image was taken, which showed the screws in good position as  well.  We then used 30 mm rods, locking screws were placed, all four screws  were fully tightened, and then the facet complex on the left side at L4-5  which had been opened was further decorticated, as were the laminae on the  left side at L4 and L5, and this area was packed with Vitoss that had been  soaked in bone marrow aspirate.  The bone marrow aspirate had been aspirated  through the pedicles from the vertebral bodies.  We then decorticated the  surface of the transverse processes on the right side with the [facility]  drill, and additional Vitoss soaked in bone marrow aspirate was packed in  the lateral gutter overlying these transverse processes and the  intertransverse space and, as mentioned earlier, the disk space around the allograft had been  packed with Vitoss soaked in bone marrow aspirate.  We  again examined the laminotomy and facetectomy region.  The thecal sac and  nerve root remained decompressed, and we then proceeded with closure.  The  deep fascia was closed with interrupted, undyed 1Vicryl sutures, the  subcutaneous layer was closed with interrupted, undyed 1 Vicryl suture, the  subcutaneous and subcuticular layer were  closed with interrupted 1 and 2-0  undyed Vicryl sutures, and the skin was reapproximated with Dermabond.  The  patient tolerated the procedure well.  The estimated blood loss was 600 cc.  The patient was given back 200 cc of Cell Saver blood.  Sponge and needle  count were correct.  Following surgery the patient was placed back in the  supine position, reversed from the anesthetic, extubated, and transferred to  the recovery room for further care, where she was noted to be moving all  four extremities.                                                Hewitt Shorts, M.D.    RWN/MEDQ  D:  12/03/2001  T:  12/03/2001  Job:  [id number]

## 2010-06-08 NOTE — Discharge Summary (Signed)
NAME:  [name], [name]                      ACCOUNT NO.:  [id number]   MEDICAL RECORD NO.:  [id number]                   PATIENT TYPE:  INP   LOCATION:  3002                                 FACILITY:  [facility]   PHYSICIAN:  Hewitt Shorts, M.D.            DATE OF BIRTH:  1940-12-26   DATE OF ADMISSION:  12/03/2001  DATE OF DISCHARGE:  12/08/2001                                 DISCHARGE SUMMARY   ADMISSION HISTORY AND PHYSICAL EXAM:  The patient is a 70 year old woman who  had presented with a right lumbar radiculopathy.  In July of this year, she  underwent a right L4-5 lumbar laminotomy and microdiskectomy.  She initially  did well but developed recurrent radicular pain, with incapacitating pain  into her buttock, lateral right hip, thigh and leg.  She was restudied with  an MRI scan, treated with Neurontin and Vioxx.  She was found to have a  dynamic listhesis at L4-5 with associated pain.  There was advanced facet  arthropathy and foraminal encroachment bilaterally at L4-5.  General  examination was unremarkable.  Neurologic examination was intact.   HOSPITAL COURSE:  The patient was admitted, underwent a right L4-5 lumbar  laminotomy with facetectomy, microdiskectomy and transverse posterior lumbar  interbody fusion with split femoral ring allograft and Vitoss with bone  marrow aspirate and a bilateral L4-5 posterolateral arthrodesis with an end  pin posterior instrumentation and Vitoss with bone marrow aspirate.  Postoperatively, she had difficulties with some upper airway dysfunction  that was attributed to lisinopril and ACE inhibitor.  She was seen in  pulmonary consultation by Dr. Shan Levans, who discontinued the  lisinopril and started the patient on Avapro 150 mg every day; in addition,  the upper airway dysfunction was treated by Dr. Delford Field and the patient has  steadily improved.  The patient has made good progress from a surgical  perspective, initially with  some low-grade fever.  She was seen in  consultation by physical therapy and occupational therapy and has made good  progress with them.  The patient was released by the pulmonary service and  picked up by the [facility] hospitalist service, who continued to follow her.  She is afebrile, her wound is healing well.  She is being discharged to home  with instructions regarding wound care and activity.  She is to return to  see her primary physician, Dr. Stacie Glaze, within the next week to  follow up on her upper airway dysfunction and to follow up on her  antihypertensive medications, since she was switched by Dr. Delford Field from  lisinopril to Avapro.  She was given a prescription for the Avapro 150 mg  every day, 15 tablets and no refills.  She is wearing her lumbar corset when  up and out of bed.  She is to return to my office in three weeks for  followup and to have AP and lateral lumbar spine x-rays  done.   DISCHARGE MEDICATIONS:  She was given a prescription for Percocet one to two  tablets p.o. q.4-6h. p.r.n. pain, 50 tablets, no refills, and she is also  going to continue on the Vioxx that she had been taking prior to  hospitalization.   FOLLOWUP:  We will plan on seeing her in three weeks, sooner if she needs  anything before then.   DISCHARGE DIAGNOSES:  1. Dynamic lumbar instability with anterolisthesis L4 on L5, lumbar     spondylosis, degenerative disk disease, radiculopathy and recurrent on     disk herniation.  2. Laryngospasm with vocal cord dysfunction, both of which are improving.                                               Hewitt Shorts, M.D.   RWN/MEDQ  D:  12/08/2001  T:  12/08/2001  Job:  [id number]

## 2010-06-08 NOTE — Op Note (Signed)
[name], [name]            ACCOUNT NO.:  [id number]   MEDICAL RECORD NO.:  [id number]          PATIENT TYPE:  INP   LOCATION:  0151                         [facility]ACILITY[facility]   PHYSICIAN:  Thornton Park. Daphine Deutscher, MD  DATE OF BIRTH:  1941/01/05   DATE OF PROCEDURE:  12/04/2004  DATE OF DISCHARGE:                                 OPERATIVE REPORT   PREOPERATIVE DIAGNOSIS:  Morbid obesity, free failed greater curvature  gastroplasty done in 1981, now with multiple medical comorbidities including  diabetes, sleep apnea, arthritis, hypertension.   POSTOPERATIVE DIAGNOSIS:  Morbid obesity, free failed greater curvature  gastroplasty done in 1981, now with multiple medical comorbidities including  diabetes, sleep apnea, arthritis, hypertension.   PROCEDURE:  Laparoscopic takedown of foregut adhesions from prior open  gastroplasty (4 hours), laparoscopic Roux-en-Y gastric bypass with 40 cm  biliopancreatic limb and 100 cm Roux limb.   SURGEON:  Thornton Park. Daphine Deutscher, M.D.   ASSISTANT:  Baruch Merl, M.D.   ANESTHESIA:  General endotracheal.   DRAINS:  Drains one JP in the upper abdomen.   OPERATIVE TIME:  8 hours   DESCRIPTION OF PROCEDURE:  The patient was taken to room one at [facility]  and given general anesthesia. The abdomen was prepped with Betadine and  draped sterilely.  I attempted to enter the abdomen to the left upper  quadrant, but there were adhesions.  However, I did get in and I was able to  insufflate.  I then insufflated the abdomen and began using OptiVu and  entered the right upper quadrant.  She had a massive amount of adhesions  from her previous open gastroplasty, and I had to put several ports in at  the perimeter and then using a Harmonic scalpel I took down these adhesions  to the anterior abdominal wall. Once this was completed, I was able to focus  my attention on the upper abdomen where I began taking the liver down from  the anterior abdominal wall and  then taking the stomach down from the left  lateral segment to enable me to complete this operation.  I did visualize  the gallbladder.  She does have caput medusae kind of change in the fundus.   Dr. Luan Pulling and I then began exploring the foregut and spent four hours  taking down adhesions from the lateral segment to the anterior surface of  stomach.  They had previously placed a lot of clips in this area and the  titanium elicited quite an inflammatory reaction, and dozens of staples  produced adhesions.  It took a long time to take down and I was able to take  these down and then note the site of the previous stapled the area.  I began  doing some dissection around the edge of the stomach and found myself in the  lumen of the stomach.  That provoked me to do Dr. Colin Benton to do the first of  several upper endoscopies at this time to go into the pouch, to see the size  of the pouch, and to see the hole.  With that in place, I did  delineate the  size, the  I felt the lumen of the stomach. That probe needed to the first  actually Dr. chronic Dr. all to the first of several upper endoscopy at this  time to go into the pouch to see the side of the pouch and then this ED  caudal. With that in place and I did the end to delineate the size of this.  It was about size of the end of a little pencil eraser tip, and I closed it  with running sutures of 4-0 Vicryl and with interrupted silk sutures.   I then began sizing the pouch and tried to go above this area to see if I  could get above the staple area and create a pouch.  I found that would only  give [place] about a 2 cm or 1 cm pouch the globe of this area to see if IV get  above the stapler and create a pouch and found that would only give [place] about  a 2 or 1 cm pouch, and it was totally unacceptable as there was a remnant of  staples in the middle, would only be was about 201 cm pouch and was totally  unacceptable there was a remnant of staples in  the middle and this would not  afforded much pouch at all.   After some deliberation, I decided to create a larger proximal pouch, which  I did the stapling across the stomach distal to the site of the gastrotomy  and used several firings  using a bronze cartridge on the Echelon stapler.  This foregut dissection took 4 hours.   The following 4 hours was then done taking down, completing the gastric  pouch and then going down and finding the ligament of Treitz.  Unfortunately, when I had endoscoped the patient she did have some  insufflation of the bowel and I did make that  a little more tedious, but I  did identify the ligament of Treitz and marched down 40+ cm to divide the  pancreatic limb which I divided with a GIA marking the distal Roux limb with  a Penrose drain.  I then measured 100+ cm of Roux limb and actually loss  that a couple times.  I had to restart, but I had a very good accurate count  of centimeters to do the meter plus Roux limb and then tacked those to each  other.  Common defects were made.  The Echelon was inserted with a white  cartridge and fired.  There was one little spot that looked like a burn  injury on the Roux side which I over sewed with a pursestring of silk.  I  incorporated that into an anti-obstruction stitch.  In the meantime, I went  ahead and closed the common defect with two sutures of 2-0 Vicryl from  either end, tying in the middle and reinforcing that one spot with another 2-  0 Vicryl.  There was also noted a Prestige grasper tear along the mesenteric  side just on the distal common channel of the anastomosis, and that was  repaired with a running 2-0 silk tie at either end.  This completely closed  out.  I used an alligator clamp to test the anastomosis and did not see any  openings and then elected to Tisseel the entire area with Tisseel including the closure on the enterotomy along the mesenteric border of the bowel down  stream in the  common channel.   The Roux  limb was then brought up and taken in an anti-colic, ante gastric  fashion with the Roux, and he came to the left and was sewn along the  antimesenteric border with a running 2-0 Vicryl.  Again common defect was  made in the stomach and then the Roux limb, and the Echelon stapler was  reused once again to create a common channel and outlay.  This was then  closed in two layers using 4-0 and 2-0 Vicryl and a second layer of 2-0  Vicryl. Tisseel was applied to this.  Dr. Colin Benton endoscoped the patient yet  again.  Again, it was inserted several times during the sizing of the pouch  and the final endoscopy was done to insufflate the pouch, putting the  anastomosis under pressure and submerge.  The anastomosis was complete and  checked with endoscopy. Tisseel was applied.  A Jackson-Pratt drain was  placed in the upper abdomen and then brought out through a 5 mm port along  the left lateral side the abdomen.  The abdomen was deflated, and wounds  were closed with 4-0 Vicryl and staples.  The patient seemed to tolerate the  procedure well.  There was deflated and wounds were closed 4-0 Vicryl and  staples. The patient seemed to tolerate the procedure well.  She was taken  to recovery room in satisfactory condition and will be taken to the  intensive care unit postoperatively.      Thornton Park Daphine Deutscher, MD  Electronically Signed     MBM/MEDQ  D:  12/04/2004  T:  12/05/2004  Job:  [id number]

## 2010-06-08 NOTE — Discharge Summary (Signed)
[name], [name]            ACCOUNT NO.:  [id number]   MEDICAL RECORD NO.:  [id number]          PATIENT TYPE:  INP   LOCATION:  1519                         FACILITY:  [facility]   PHYSICIAN:  Thornton Park. Daphine Deutscher, MD  DATE OF BIRTH:  [birth year]   DATE OF ADMISSION:  12/04/2004  DATE OF DISCHARGE:  12/08/2004                                 DISCHARGE SUMMARY   ADMITTING DIAGNOSES:  Morbid obesity, status post greater curvature  gastroplasty in 1981 that has failed.   DISCHARGE DIAGNOSIS:  Status post revision of proximal foregut bariatric  surgery with conversion of gastric plication into a laparoscopic Roux-en-Y  gastric bypass.   HOSPITAL COURSE:  [name] underwent the above-mentioned operations  on December 04, 2004, for approximately eight hours.  Postoperatively, she  went to the intensive care unit where she was observed.  On her first  postoperative day, her swallow showed some delayed gastric emptying, but it  emptied, and she was started on liquids.  She was advanced slowly.  Her  temperature stayed down, and she was ready for discharge on December 08, 2004, postoperative day #4.  Her Jackson-Pratt drainage with a decrease when  it was removed.  She was given elixir for pain, and her vitals were stable  with a heart rate of 72.  She was discharged to follow up in the office the  following week.   FINAL DIAGNOSIS:  Status post revision of bariatric surgery from previous  gastroplasty, done open in 1981, to laparoscopic Roux-en-Y gastric bypass.      Thornton Park Daphine Deutscher, MD  Electronically Signed     MBM/MEDQ  D:  12/25/2004  T:  12/26/2004  Job:  [id number]

## 2010-06-08 NOTE — Procedures (Signed)
[name], [name]            ACCOUNT NO.:  [id number]   MEDICAL RECORD NO.:  [id number]          PATIENT TYPE:  OUT   LOCATION:  SLEEP CENTER                 FACILITY:  [facility]   PHYSICIAN:  Clinton D. Maple Hudson, M.D. DATE OF BIRTH:  1940-04-12   DATE OF STUDY:  04/17/2004                              NOCTURNAL POLYSOMNOGRAM   REFERRING PHYSICIAN:  Dr. Molli Hazard B. Daphine Deutscher.   INDICATION FOR SURGERY:  Hypersomnia with sleep apnea.   EPWORTH SLEEPINESS SCORE:  9/24.   BODY MASS INDEX:  46.   WEIGHT:  282 pounds.   SLEEP ARCHITECTURE:  Total sleep time 306 minutes with sleep efficiency 84%.  Stage I was 9%, stage II 54%, stages III and IV 23%, REM was 14% of total  sleep time.  Sleep latency 34 minutes, REM latency 181 minutes, awake after  sleep onset 33 minutes, arousal index increased at 41.  Hypoglycemic  medicines, but no sleep medications, were taken at the beginning of the  study.   RESPIRATORY DATA:  Respiratory disturbance index (RDI) 29 obstructive events  per hour, indicating moderately severe obstructive sleep apnea/hypopnea  syndrome before CPAP.  There were 34 obstructive apneas, 2 mixed apneas and  112 hypopneas.  Most sleep and most events were while supine.  REM RDI 94.  Most events occurred after 2:30 a.m., allowing insufficient time for CPAP  titration by protocol.   OXYGEN DATA:  Moderate snoring with oxygen desaturation to a nadir of 83%.  Mean oxygen saturation through the study was 96% on room air.   CARDIAC DATA:  Normal sinus rhythm.   MOVEMENT/PARASOMNIA:  A total of 81 limb jerks were recorded of which 33%  were associated with arousal or awakening for a periodic limb movement with  arousal index of 6.5 per hour, which is increased.   IMPRESSION/RECOMMENDATION:  1.  Moderately severe obstructive sleep apnea/hypopnea syndrome, respiratory      disturbance index 29 per hour with moderate snoring and oxygen      desaturation to 83%.  2.  Continuous  positive airway pressure titration protocol could not be      utilized on this study night, but return for continuous positive airway      pressure titration would be appropriate.  It would help for her to bring      a sleep medication with her for the return study.  3.  If surgery is anticipated before continuous positive airway pressure      titration can be accomplished, suggest      empiric use of continuous positive airway pressure at 10 centimeters of      water pressure for this purpose.  4.  Periodic limb movement with arousal 6.5 per hour.      CDY/MEDQ  D:  04/22/2004 14:44:45  T:  04/23/2004 95:63:87  Job:  [id number]

## 2010-06-08 NOTE — Op Note (Signed)
[facility]  Patient:    [name], [name] Visit Number: [id number] MRN: [id number]          Service Type: SUR Location: [id number] Attending Physician:  Barton Fanny Dictated by:   Hewitt Shorts, M.D. Proc. Date: 08/10/01 Admit Date:  08/10/2001 Discharge Date: 08/11/2001                             Operative Report  PREOPERATIVE DIAGNOSIS:  Right L4-5 lumbar disk herniation.  POSTOPERATIVE DIAGNOSIS:  Right L4-5 lumbar disk herniation.  PROCEDURE:  Right L4-5 lumbar laminotomy and microdiskectomy.  SURGEON:  Hewitt Shorts, M.D.  ASSISTANT:  Stefani Dama, M.D.  ANESTHESIA:  General endotracheal.  INDICATIONS:  The patient is a 70 year old woman who presented with right lumbar radiculopathy.  Was found by MRI scan to have a right L4-5 lumbar disk herniation superimposed upon significant degenerative disease and spondylosis. The decision was made to proceed with elective laminotomy and microdiskectomy.  DESCRIPTION OF PROCEDURE:  The patient was brought to the operating room and placed under general endotracheal anesthesia.  The patient was turned to the prone position.  The lumbar region was prepped with Betadine soap and solution, and draped in a sterile fashion.  The midline was infiltrated with a local anesthetic with epinephrine.  An x-ray was taken to localize the L4-5 level and a midline incision made over the L4-5 level, and carried down to the subcutaneous tissue.  Bipolar cautery and electrocautery were used to obtained hemostasis.  The incision was carried down to the lumbar fascia which was incised on the right side of the midline and the paraspinous muscles were dissected from the spinous process and lamina in a subperiosteal fashion.  A self-retaining retractor was placed and another x-ray was taken and t he L4-5 level identified.  A laminotomy was performed using the black Max drill and Kerrison  punches.  The microscope was draped and brought into the field to provide instant magnification, illumination, and visualization.  The remainder of the procedure was performed using microdissection and microsurgical technique.  The ligamentum flavum was carefully resected and the thecal sac and right L5 nerve root were identified.  We then coagulated epidural veins as necessary.  The thecal sac and nerve root were retracted medially and the disk herniation and the annulus were identified.  The annulus was incised and the disk herniation removed in a piecemeal fashion.  The disk herniation itself was spondylitic in nature.  We were able to proceed with a thorough diskectomy, removing all loose fragments of disk material from both disk spaces and the epidural space.  Posterior spondylitic overgrowth was removed using an osteophyte removal tool and good decompression of the right L4-5 foramen as well as the thecal sac and nerve root was achieved.  Once all loose fragments of disk material were removed and decompression completed, we established hemostasis, irrigated the wound with Bacitracin solution, and then instilled 2 cc of fentanyl into the epidural space.  The wound was then closed in multiple layers.  The deep fascia closed with interrupted undyed 1 Vicryl sutures.  The subcutaneous layers were closed with interrupted and inverted undyed 1 Vicryl sutures.  The subcutaneous and subcuticular layer closed with interrupted and inverted 2-0 undyed Vicryl sutures and the skin was reapproximated with Dermabond.  The patient tolerated the procedure well.  The estimated blood loss was 100 cc.  Sponge and  needle count were correct.  Following surgery, the patient was turned to the supine position to be reversed from the anesthetic, extubated, and transferred to the recovery room for further care. Dictated by:   Hewitt Shorts, M.D. Attending Physician:  Barton Fanny DD:   08/10/01 TD:  08/13/01 Job: [id number]

## 2010-07-06 ENCOUNTER — Other Ambulatory Visit: Payer: Self-pay | Admitting: *Deleted

## 2010-07-06 MED ORDER — CYCLOBENZAPRINE HCL 10 MG PO TABS: 10.0000 mg | ORAL_TABLET | Freq: Three times a day (TID) | ORAL | Status: DC | PRN

## 2010-07-06 MED ORDER — ALPRAZOLAM 0.5 MG PO TABS: 0.5000 mg | ORAL_TABLET | Freq: Three times a day (TID) | ORAL | Status: DC | PRN

## 2010-08-03 ENCOUNTER — Telehealth: Payer: Self-pay | Admitting: *Deleted

## 2010-08-03 NOTE — Telephone Encounter (Signed)
Pt informed labs were wnl

## 2010-08-03 NOTE — Telephone Encounter (Signed)
Pt is asking to take 3 Voltaren daily due to increased arthritis.

## 2010-08-06 ENCOUNTER — Telehealth: Payer: Self-pay | Admitting: *Deleted

## 2010-08-06 NOTE — Telephone Encounter (Signed)
Pt would like to increase her Voltaren from twice daily to three times daily

## 2010-08-06 NOTE — Telephone Encounter (Signed)
Wants to take 3 Voltaren???

## 2010-08-06 NOTE — Telephone Encounter (Signed)
Per dr Lovell Sheehan- over the recommended d-can not have -pt informed

## 2010-09-06 ENCOUNTER — Other Ambulatory Visit (INDEPENDENT_AMBULATORY_CARE_PROVIDER_SITE_OTHER): Payer: Medicare Other

## 2010-09-06 DIAGNOSIS — E039 Hypothyroidism, unspecified: Secondary | ICD-10-CM

## 2010-09-06 DIAGNOSIS — E119 Type 2 diabetes mellitus without complications: Secondary | ICD-10-CM

## 2010-09-06 DIAGNOSIS — D649 Anemia, unspecified: Secondary | ICD-10-CM

## 2010-09-06 LAB — CBC WITH DIFFERENTIAL/PLATELET
Basophils Absolute: 0 10*3/uL (ref 0.0–0.1)
Basophils Relative: 0.3 % (ref 0.0–3.0)
Eosinophils Absolute: 0.2 10*3/uL (ref 0.0–0.7)
Lymphocytes Relative: 24 % (ref 12.0–46.0)
MCHC: 33.3 g/dL (ref 30.0–36.0)
MCV: 89.2 fl (ref 78.0–100.0)
Monocytes Absolute: 0.6 10*3/uL (ref 0.1–1.0)
Neutrophils Relative %: 64.5 % (ref 43.0–77.0)
Platelets: 203 10*3/uL (ref 150.0–400.0)
RBC: 4.8 Mil/uL (ref 3.87–5.11)

## 2010-09-06 LAB — HEMOGLOBIN A1C: Hgb A1c MFr Bld: 6 % (ref 4.6–6.5)

## 2010-09-08 ENCOUNTER — Other Ambulatory Visit: Payer: Self-pay | Admitting: Internal Medicine

## 2010-10-22 ENCOUNTER — Encounter: Payer: Self-pay | Admitting: Internal Medicine

## 2010-10-22 ENCOUNTER — Ambulatory Visit (INDEPENDENT_AMBULATORY_CARE_PROVIDER_SITE_OTHER): Payer: Medicare Other | Admitting: Internal Medicine

## 2010-10-22 VITALS — BP 140/84 | HR 76 | Temp 98.2°F | Resp 16 | Ht 64.0 in | Wt 206.0 lb

## 2010-10-22 DIAGNOSIS — F329 Major depressive disorder, single episode, unspecified: Secondary | ICD-10-CM

## 2010-10-22 DIAGNOSIS — Z23 Encounter for immunization: Secondary | ICD-10-CM

## 2010-10-22 DIAGNOSIS — J45909 Unspecified asthma, uncomplicated: Secondary | ICD-10-CM

## 2010-10-22 DIAGNOSIS — F3289 Other specified depressive episodes: Secondary | ICD-10-CM

## 2010-10-22 DIAGNOSIS — E119 Type 2 diabetes mellitus without complications: Secondary | ICD-10-CM

## 2010-10-22 MED ORDER — DESVENLAFAXINE SUCCINATE ER 100 MG PO TB24: 100.0000 mg | ORAL_TABLET | Freq: Every day | ORAL | Status: DC

## 2010-10-22 MED ORDER — MONTELUKAST SODIUM 10 MG PO TABS: 10.0000 mg | ORAL_TABLET | Freq: Every day | ORAL | Status: DC

## 2010-10-22 NOTE — Patient Instructions (Addendum)
Patient was instructed to continue all medications as prescribed. To stop at the checkout desk and schedule a followup appointment  

## 2010-11-30 ENCOUNTER — Encounter: Payer: Self-pay | Admitting: Internal Medicine

## 2011-01-21 NOTE — Progress Notes (Signed)
Subjective:    Patient ID: [name], female    DOB: 09/08/1940, 70 y.o.   MRN: [id number]  HPI  Patient is a 70 year old white female followed for hypertension depression and osteoarthritis.  She is a complicated history of depression that is related to her relationship with her estranged husband with whom she is divorcing.  Her hypertension has been moderately well controlled but she has had trouble recently with exercise and weight 22 increased osteoarthritic pain and lack of exercise.  She has a history of morbid obesity post bariatric intervention. She has a history of diabetes She has a history of anemia  Review of Systems  Constitutional: Negative for activity change, appetite change and fatigue.  HENT: Negative for ear pain, congestion, neck pain, postnasal drip and sinus pressure.   Eyes: Negative for redness and visual disturbance.  Respiratory: Negative for cough, shortness of breath and wheezing.   Gastrointestinal: Negative for abdominal pain and abdominal distention.  Genitourinary: Negative for dysuria, frequency and menstrual problem.  Musculoskeletal: Negative for myalgias, joint swelling and arthralgias.  Skin: Negative for rash and wound.  Neurological: Negative for dizziness, weakness and headaches.  Hematological: Negative for adenopathy. Does not bruise/bleed easily.  Psychiatric/Behavioral: Negative for sleep disturbance and decreased concentration.   Past Medical History  Diagnosis Date  . Asthma   . Depression   . Hypertension   . Vitamin B 12 deficiency   . Anemia   . Thyroid disease     History   Social History  . Marital Status: Married    Spouse Name: N/A    Number of Children: N/A  . Years of Education: N/A   Occupational History  . retired Engineer, civil (consulting)    Social History Main Topics  . Smoking status: Former Games developer  . Smokeless tobacco: Not on file  . Alcohol Use: 1.2 oz/week    2 Glasses of wine per week  . Drug Use: Not on file    . Sexually Active: Not Currently   Other Topics Concern  . Not on file   Social History Narrative  . No narrative on file    Past Surgical History  Procedure Date  . Abdominal hysterectomy   . Oophorectomy   . Bariatric surgery   . Pantallor arthrodesis with rod placement left foot     Family History  Problem Relation Age of Onset  . Stroke Mother   . Liver cancer Father   . Cancer Father     liver    Allergies  Allergen Reactions  . Ace Inhibitors   . Azithromycin     REACTION: Rash  . Codeine     REACTION: Upset stomach    Current Outpatient Prescriptions on File Prior to Visit  Medication Sig Dispense Refill  . ALPRAZolam (XANAX) 0.5 MG tablet Take 1 tablet (0.5 mg total) by mouth 3 (three) times daily as needed.  30 tablet  6  . APAP-Salicyl-Phenyltolox-Caff (CAFGESIC) 325-250-20-50 MG CAPS Take by mouth every 6 (six) hours as needed.        . betamethasone dipropionate (DIPROLENE) 0.05 % cream Apply topically 2 (two) times daily.  30 g  0  . Cholecalciferol (VITAMIN D) 1000 UNITS capsule Take 1,000 Units by mouth 2 (two) times daily.        . cyanocobalamin (COBAL-1000) 1000 MCG/ML injection Inject 1,000 mcg into the muscle every 30 (thirty) days.        . cyclobenzaprine (FLEXERIL) 10 MG tablet Take 1 tablet (10 mg total)  by mouth 3 (three) times daily as needed.  30 tablet  6  . diclofenac (VOLTAREN) 75 MG EC tablet TAKE 1 TABLET TWICE A DAY  180 tablet  2  . FeFum-FePo-FA-B Cmp-C-Zn-Mn-Cu (TANDEM PLUS PO) Take 2 tablets by mouth daily.       . fluticasone (FLONASE) 50 MCG/ACT nasal spray 2 sprays by Nasal route daily.        Marland Kitchen glucose blood (PRECISION XTRA TEST STRIPS) test strip 1 each by Other route as needed. Use as instructed       . Omega-3 Fatty Acids (FISH OIL) 1000 MG CAPS Take by mouth 2 (two) times daily.        Marland Kitchen thyroid (ARMOUR) 240 MG tablet Take 1 tablet (240 mg total) by mouth daily.  90 tablet  3    BP 140/84  Pulse 76  Temp 98.2 F (36.8  C)  Resp 16  Ht 5\' 4"  (1.626 m)  Wt 206 lb (93.441 kg)  BMI 35.36 kg/m2        Objective:   Physical Exam  Nursing note reviewed. Constitutional: She is oriented to person, place, and time. She appears well-developed and well-nourished. No distress.  HENT:  Head: Normocephalic and atraumatic.  Right Ear: External ear normal.  Left Ear: External ear normal.  Nose: Nose normal.  Mouth/Throat: Oropharynx is clear and moist.  Eyes: Conjunctivae and EOM are normal. Pupils are equal, round, and reactive to light.  Neck: Normal range of motion. Neck supple. No JVD present. No tracheal deviation present. No thyromegaly present.  Cardiovascular: Normal rate, regular rhythm, normal heart sounds and intact distal pulses.   No murmur heard. Pulmonary/Chest: Effort normal and breath sounds normal. She has no wheezes. She exhibits no tenderness.  Abdominal: Soft. Bowel sounds are normal.  Musculoskeletal: Normal range of motion. She exhibits no edema and no tenderness.  Lymphadenopathy:    She has no cervical adenopathy.  Neurological: She is alert and oriented to person, place, and time. She has normal reflexes. No cranial nerve deficit.  Skin: Skin is warm and dry. She is not diaphoretic.          Assessment & Plan:  The patient has been a strict diet and  has lost significant weight since her last office visit. This is displayed and better diabetic and better hypertensive control.  We discussed referring to a counselor for her depression issues. And a referral was made patient will call for an appointment. Her blood pressure is stable her current medications appropriate monitoring of diabetes anemia and vitamin levels post bariatric status will be accomplished today

## 2011-02-01 ENCOUNTER — Other Ambulatory Visit: Payer: Self-pay | Admitting: Internal Medicine

## 2011-02-25 ENCOUNTER — Encounter: Payer: Self-pay | Admitting: Internal Medicine

## 2011-02-25 ENCOUNTER — Ambulatory Visit (INDEPENDENT_AMBULATORY_CARE_PROVIDER_SITE_OTHER): Payer: Medicare Other | Admitting: Internal Medicine

## 2011-02-25 DIAGNOSIS — E039 Hypothyroidism, unspecified: Secondary | ICD-10-CM

## 2011-02-25 DIAGNOSIS — J45909 Unspecified asthma, uncomplicated: Secondary | ICD-10-CM

## 2011-02-25 DIAGNOSIS — E119 Type 2 diabetes mellitus without complications: Secondary | ICD-10-CM

## 2011-02-25 LAB — CBC WITH DIFFERENTIAL/PLATELET
Basophils Absolute: 0 10*3/uL (ref 0.0–0.1)
Eosinophils Absolute: 0.3 10*3/uL (ref 0.0–0.7)
Lymphocytes Relative: 18.8 % (ref 12.0–46.0)
MCHC: 33 g/dL (ref 30.0–36.0)
Monocytes Relative: 8.8 % (ref 3.0–12.0)
Neutro Abs: 3.7 10*3/uL (ref 1.4–7.7)
Neutrophils Relative %: 67.2 % (ref 43.0–77.0)
Platelets: 160 10*3/uL (ref 150.0–400.0)
RDW: 13.6 % (ref 11.5–14.6)

## 2011-02-25 LAB — T3, FREE: T3, Free: 3 pg/mL (ref 2.3–4.2)

## 2011-02-25 LAB — T4, FREE: Free T4: 0.81 ng/dL (ref 0.60–1.60)

## 2011-02-25 LAB — TSH: TSH: 0.07 u[IU]/mL — ABNORMAL LOW (ref 0.35–5.50)

## 2011-02-25 LAB — HEMOGLOBIN A1C: Hgb A1c MFr Bld: 5.7 % (ref 4.6–6.5)

## 2011-02-25 MED ORDER — MONTELUKAST SODIUM 10 MG PO TABS: 10.0000 mg | ORAL_TABLET | Freq: Every day | ORAL | Status: DC

## 2011-02-25 MED ORDER — THYROID 240 MG PO TABS
240.0000 mg | ORAL_TABLET | Freq: Every day | ORAL | Status: DC
Start: 2011-02-25 — End: 2012-01-31

## 2011-02-25 NOTE — Patient Instructions (Signed)
The patient is instructed to continue all medications as prescribed. Schedule followup with check out clerk upon leaving the clinic  

## 2011-02-25 NOTE — Progress Notes (Signed)
Subjective:    Patient ID: [name], female    DOB: July 03, 1940, 71 y.o.   MRN: [id number]  HPI Patient is a 71 year old female presents for followup for hypertension asthma depression and hypothyroidism.  The patient has changed pharmacy program through her insurance and several of her medications have been denied for review.  She was on Singulair for asthma and has been stable for quite some while without excessive use of her rescue inhaler the Singulair was denied.  The patient was on Armour Thyroid after having failed Synthroid in the past and has been doing exceptionally well on Armour Thyroid and this medication was denied. We will await a fax from her pharmacy benefits company for preauthorization for these medications in the interim we will provide her with local prescriptions that she could take to [facility].   Review of Systems  Constitutional: Negative for activity change, appetite change and fatigue.  HENT: Negative for ear pain, congestion, neck pain, postnasal drip and sinus pressure.   Eyes: Negative for redness and visual disturbance.  Respiratory: Negative for cough, shortness of breath and wheezing.   Gastrointestinal: Negative for abdominal pain and abdominal distention.  Genitourinary: Negative for dysuria, frequency and menstrual problem.  Musculoskeletal: Negative for myalgias, joint swelling and arthralgias.  Skin: Negative for rash and wound.  Neurological: Negative for dizziness, weakness and headaches.  Hematological: Negative for adenopathy. Does not bruise/bleed easily.  Psychiatric/Behavioral: Negative for sleep disturbance and decreased concentration.   Past Medical History  Diagnosis Date  . Asthma   . Depression   . Hypertension   . Vitamin B 12 deficiency   . Anemia   . Thyroid disease     History   Social History  . Marital Status: Married    Spouse Name: N/A    Number of Children: N/A  . Years of Education: N/A    Occupational History  . retired Engineer, civil (consulting)    Social History Main Topics  . Smoking status: Former Games developer  . Smokeless tobacco: Not on file  . Alcohol Use: 1.2 oz/week    2 Glasses of wine per week  . Drug Use: Not on file  . Sexually Active: Not Currently   Other Topics Concern  . Not on file   Social History Narrative  . No narrative on file    Past Surgical History  Procedure Date  . Abdominal hysterectomy   . Oophorectomy   . Bariatric surgery   . Pantallor arthrodesis with rod placement left foot     Family History  Problem Relation Age of Onset  . Stroke Mother   . Liver cancer Father   . Cancer Father     liver    Allergies  Allergen Reactions  . Ace Inhibitors   . Azithromycin     REACTION: Rash  . Codeine     REACTION: Upset stomach    Current Outpatient Prescriptions on File Prior to Visit  Medication Sig Dispense Refill  . ALPRAZolam (XANAX) 0.5 MG tablet Take 1 tablet (0.5 mg total) by mouth 3 (three) times daily as needed.  30 tablet  6  . APAP-Salicyl-Phenyltolox-Caff (CAFGESIC) 325-250-20-50 MG CAPS Take by mouth every 6 (six) hours as needed.        . betamethasone dipropionate (DIPROLENE) 0.05 % cream Apply topically 2 (two) times daily.  30 g  0  . Cholecalciferol (VITAMIN D) 1000 UNITS capsule Take 1,000 Units by mouth 2 (two) times daily.        Marland Kitchen  cyanocobalamin (,VITAMIN B-12,) 1000 MCG/ML injection INJECT INTRAMUSCULARLY ONCE A MONTH.  10 mL  1  . cyclobenzaprine (FLEXERIL) 10 MG tablet Take 1 tablet (10 mg total) by mouth 3 (three) times daily as needed.  30 tablet  6  . desvenlafaxine (PRISTIQ) 100 MG 24 hr tablet Take 1 tablet (100 mg total) by mouth daily.  90 tablet  3  . diclofenac (VOLTAREN) 75 MG EC tablet TAKE 1 TABLET TWICE A DAY  180 tablet  2  . FeFum-FePo-FA-B Cmp-C-Zn-Mn-Cu (TANDEM PLUS PO) Take 2 tablets by mouth daily.       Marland Kitchen FeFum-FePo-FA-B Cmp-C-Zn-Mn-Cu (TANDEM PLUS) 162-115.2-1 MG CAPS TAKE ONE CAPSULE BY MOUTH TWICE  DAILY  180 each  2  . fluticasone (FLONASE) 50 MCG/ACT nasal spray 2 sprays by Nasal route daily.        Marland Kitchen glucose blood (PRECISION XTRA TEST STRIPS) test strip 1 each by Other route as needed. Use as instructed       . montelukast (SINGULAIR) 10 MG tablet Take 1 tablet (10 mg total) by mouth at bedtime.  90 tablet  3  . Omega-3 Fatty Acids (FISH OIL) 1000 MG CAPS Take by mouth 2 (two) times daily.        Marland Kitchen thyroid (ARMOUR) 240 MG tablet Take 1 tablet (240 mg total) by mouth daily.  90 tablet  3    BP 136/80  Pulse 72  Temp 98.3 F (36.8 C)  Resp 16  Ht 5\' 2"  (1.575 m)  Wt 200 lb (90.719 kg)  BMI 36.58 kg/m2        Objective:   Physical Exam  Nursing note and vitals reviewed. Constitutional: She is oriented to person, place, and time. She appears well-developed and well-nourished. No distress.  HENT:  Head: Normocephalic and atraumatic.  Right Ear: External ear normal.  Left Ear: External ear normal.  Nose: Nose normal.  Mouth/Throat: Oropharynx is clear and moist.  Eyes: Conjunctivae and EOM are normal. Pupils are equal, round, and reactive to light.  Neck: Normal range of motion. Neck supple. No JVD present. No tracheal deviation present. No thyromegaly present.  Cardiovascular: Normal rate, regular rhythm, normal heart sounds and intact distal pulses.   No murmur heard. Pulmonary/Chest: Effort normal and breath sounds normal. She has no wheezes. She exhibits no tenderness.  Abdominal: Soft. Bowel sounds are normal.  Musculoskeletal: Normal range of motion. She exhibits no edema and no tenderness.  Lymphadenopathy:    She has no cervical adenopathy.  Neurological: She is alert and oriented to person, place, and time. She has normal reflexes. No cranial nerve deficit.  Skin: Skin is warm and dry. She is not diaphoretic.  Psychiatric: She has a normal mood and affect. Her behavior is normal.          Assessment & Plan:  Give her short-term prescriptions for her  asthma and for hypothyroidism until we can sort out the problems with her mail order pharmacy.  Apparently they have decided that her long-term medications are no longer formulary and requiring pharmacy board review we will await the appropriate documentation completed for her in the interim we will give her short-term prescriptions for local pharmacy.  It is disconcerting that a formulary change to her pharmacy with suddenly warned her medicine to be discontinued or denied. I believe that this is inappropriate and risky medical care dictated by pharmacy formula rather than by a physician. I have discussed this with the patient.

## 2011-02-28 NOTE — Progress Notes (Signed)
Left message on machine.

## 2011-03-08 ENCOUNTER — Telehealth: Payer: Self-pay | Admitting: Family Medicine

## 2011-03-08 NOTE — Telephone Encounter (Signed)
Left message on machine.

## 2011-03-08 NOTE — Telephone Encounter (Signed)
Pt's prior auth for Armour thyroid was denied by [facility]. Reason: armour thyroid is not an FDA approved drug.

## 2011-03-11 ENCOUNTER — Telehealth: Payer: Self-pay | Admitting: Family Medicine

## 2011-03-11 NOTE — Telephone Encounter (Signed)
Pt understands that Armour thyroid not covered by insurance. Wants to pay cash, but would like to do 6mos + each time, if cheaper. Requesting you call her back. Thanks!

## 2011-03-12 NOTE — Telephone Encounter (Signed)
Pt will let us k now when she finds a pharmacy she wants [place] to send script to

## 2011-04-02 ENCOUNTER — Telehealth: Payer: Self-pay | Admitting: Family Medicine

## 2011-04-02 NOTE — Telephone Encounter (Signed)
Appeal for generic Singulair went through. Pharmacy aware, [facility] for patient.

## 2011-04-02 NOTE — Telephone Encounter (Signed)
Patient's prior auth generic Singulair was denied. At this point, my only option is a letter of appeal. If she calls, I am working on it. Thanks.

## 2011-04-03 NOTE — Telephone Encounter (Signed)
Patient aware that Singulair has been approved.

## 2011-04-29 ENCOUNTER — Ambulatory Visit (INDEPENDENT_AMBULATORY_CARE_PROVIDER_SITE_OTHER): Payer: Medicare Other | Admitting: Internal Medicine

## 2011-04-29 ENCOUNTER — Encounter: Payer: Self-pay | Admitting: Internal Medicine

## 2011-04-29 VITALS — BP 134/80 | HR 76 | Temp 98.8°F | Resp 16 | Ht 62.0 in | Wt 210.0 lb

## 2011-04-29 DIAGNOSIS — E119 Type 2 diabetes mellitus without complications: Secondary | ICD-10-CM

## 2011-04-29 DIAGNOSIS — M1990 Unspecified osteoarthritis, unspecified site: Secondary | ICD-10-CM

## 2011-04-29 DIAGNOSIS — E039 Hypothyroidism, unspecified: Secondary | ICD-10-CM

## 2011-04-29 NOTE — Progress Notes (Signed)
Subjective:    Patient ID: [name], female    DOB: 12/06/1940, 71 y.o.   MRN: [id number]  HPI The pt has been under stress of loss of her husband Asthma stable hypothyroidism stable Lipid management stable     Review of Systems  Constitutional: Negative for activity change, appetite change and fatigue.  HENT: Negative for ear pain, congestion, neck pain, postnasal drip and sinus pressure.   Eyes: Negative for redness and visual disturbance.  Respiratory: Negative for cough, shortness of breath and wheezing.   Gastrointestinal: Negative for abdominal pain and abdominal distention.  Genitourinary: Negative for dysuria, frequency and menstrual problem.  Musculoskeletal: Negative for myalgias, joint swelling and arthralgias.  Skin: Negative for rash and wound.  Neurological: Negative for dizziness, weakness and headaches.  Hematological: Negative for adenopathy. Does not bruise/bleed easily.  Psychiatric/Behavioral: Negative for sleep disturbance and decreased concentration.   Past Medical History  Diagnosis Date  . Asthma   . Depression   . Hypertension   . Vitamin B 12 deficiency   . Anemia   . Thyroid disease     History   Social History  . Marital Status: Married    Spouse Name: N/A    Number of Children: N/A  . Years of Education: N/A   Occupational History  . retired Engineer, civil (consulting)    Social History Main Topics  . Smoking status: Former Games developer  . Smokeless tobacco: Not on file  . Alcohol Use: 1.2 oz/week    2 Glasses of wine per week  . Drug Use: Not on file  . Sexually Active: Not Currently   Other Topics Concern  . Not on file   Social History Narrative  . No narrative on file    Past Surgical History  Procedure Date  . Abdominal hysterectomy   . Oophorectomy   . Bariatric surgery   . Pantallor arthrodesis with rod placement left foot     Family History  Problem Relation Age of Onset  . Stroke Mother   . Liver cancer Father   . Cancer  Father     liver    Allergies  Allergen Reactions  . Ace Inhibitors   . Azithromycin     REACTION: Rash  . Codeine     REACTION: Upset stomach    Current Outpatient Prescriptions on File Prior to Visit  Medication Sig Dispense Refill  . ALPRAZolam (XANAX) 0.5 MG tablet Take 1 tablet (0.5 mg total) by mouth 3 (three) times daily as needed.  30 tablet  6  . APAP-Salicyl-Phenyltolox-Caff (CAFGESIC) 325-250-20-50 MG CAPS Take by mouth every 6 (six) hours as needed.        . betamethasone dipropionate (DIPROLENE) 0.05 % cream Apply topically 2 (two) times daily.  30 g  0  . Cholecalciferol (VITAMIN D) 1000 UNITS capsule Take 1,000 Units by mouth 2 (two) times daily.        . cyanocobalamin (,VITAMIN B-12,) 1000 MCG/ML injection INJECT INTRAMUSCULARLY ONCE A MONTH.  10 mL  1  . cyclobenzaprine (FLEXERIL) 10 MG tablet Take 1 tablet (10 mg total) by mouth 3 (three) times daily as needed.  30 tablet  6  . desvenlafaxine (PRISTIQ) 100 MG 24 hr tablet Take 1 tablet (100 mg total) by mouth daily.  90 tablet  3  . diclofenac (VOLTAREN) 75 MG EC tablet TAKE 1 TABLET TWICE A DAY  180 tablet  2  . FeFum-FePo-FA-B Cmp-C-Zn-Mn-Cu (TANDEM PLUS) 162-115.2-1 MG CAPS TAKE ONE CAPSULE BY MOUTH  TWICE DAILY  180 each  2  . fluticasone (FLONASE) 50 MCG/ACT nasal spray 2 sprays by Nasal route daily.        Marland Kitchen glucose blood (PRECISION XTRA TEST STRIPS) test strip 1 each by Other route as needed. Use as instructed       . montelukast (SINGULAIR) 10 MG tablet Take 1 tablet (10 mg total) by mouth at bedtime.  30 tablet  3  . Omega-3 Fatty Acids (FISH OIL) 1000 MG CAPS Take by mouth 2 (two) times daily.        Marland Kitchen thyroid (ARMOUR) 240 MG tablet Take 1 tablet (240 mg total) by mouth daily.  30 tablet  3    BP 134/80  Pulse 76  Temp 98.8 F (37.1 C)  Resp 16  Ht 5\' 2"  (1.575 m)  Wt 210 lb (95.255 kg)  BMI 38.41 kg/m2        Objective:   Physical Exam  Nursing note and vitals reviewed. Constitutional:  She is oriented to person, place, and time. She appears well-developed and well-nourished. No distress.  HENT:  Head: Normocephalic and atraumatic.  Right Ear: External ear normal.  Left Ear: External ear normal.  Nose: Nose normal.  Mouth/Throat: Oropharynx is clear and moist.  Eyes: Conjunctivae and EOM are normal. Pupils are equal, round, and reactive to light.  Neck: Normal range of motion. Neck supple. No JVD present. No tracheal deviation present. No thyromegaly present.  Cardiovascular: Normal rate, regular rhythm, normal heart sounds and intact distal pulses.   No murmur heard. Pulmonary/Chest: Effort normal and breath sounds normal. She has no wheezes. She exhibits no tenderness.  Abdominal: Soft. Bowel sounds are normal.  Musculoskeletal: Normal range of motion. She exhibits no edema and no tenderness.  Lymphadenopathy:    She has no cervical adenopathy.  Neurological: She is alert and oriented to person, place, and time. She has normal reflexes. No cranial nerve deficit.  Skin: Skin is warm and dry. She is not diaphoretic.  Psychiatric: She has a normal mood and affect. Her behavior is normal.          Assessment & Plan:  Weight gain Asthma stable depression worsened with loss of husband  Follow [place] of thyroid   Stable results Stable DM  the pts OA has not responded to the generic for voltarin She has been of the singular due to drug changed in her formulary

## 2011-04-29 NOTE — Patient Instructions (Signed)
The patient is instructed to continue all medications as prescribed. Schedule followup with check out clerk upon leaving the clinic  

## 2011-05-15 ENCOUNTER — Telehealth: Payer: Self-pay | Admitting: *Deleted

## 2011-05-15 MED ORDER — DICLOFENAC SODIUM 75 MG PO TBEC: 75.0000 mg | DELAYED_RELEASE_TABLET | Freq: Two times a day (BID) | ORAL | Status: DC

## 2011-05-15 NOTE — Telephone Encounter (Signed)
[facility] STATES PT IS REQUESTING DICLOFENAC BY [facility] ONLY

## 2011-06-11 ENCOUNTER — Telehealth: Payer: Self-pay

## 2011-06-11 ENCOUNTER — Other Ambulatory Visit: Payer: Self-pay

## 2011-06-11 NOTE — Telephone Encounter (Signed)
Pt called and states "I have reached my limit" and she is upset emotionally. Pt states Dr. Lovell Sheehan knows her husband died and she is having family problems.  Pt states she cannot function or focus.  Pt states she cries and she is not herself.  Pt states she is overwhelmed.  Pt denies thoughts of sucicide.  Pt states she is on medication for depression. Offered for pt to come in to office to see another physician. Pt declined appt and states she can wait until Dr. Lovell Sheehan returns on 06/14/11.  Pls advise.

## 2011-06-11 NOTE — Telephone Encounter (Signed)
Spoke with Dr Kirtland Bouchard and Dr Kirtland Bouchard said that [name] needed to be evaluated now and she should call a family member to take her to [facility].  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  She understands that Dr Lovell Sheehan will not be back till Friday and can wait till then.  She does not want to take the xanax and refuses to go to [facility].  [name] keeps repeating that she is fine and can wait but she is very emotional and cries.  Told [name] to call me back any time. Made Dr Kirtland Bouchard aware.

## 2011-06-14 NOTE — Telephone Encounter (Signed)
Per dr Nada Maclachlan her see susan bond--pt informed and left a message for jennifer young to give appointment

## 2011-06-20 ENCOUNTER — Ambulatory Visit (INDEPENDENT_AMBULATORY_CARE_PROVIDER_SITE_OTHER): Payer: No Typology Code available for payment source | Admitting: Licensed Clinical Social Worker

## 2011-06-20 DIAGNOSIS — F331 Major depressive disorder, recurrent, moderate: Secondary | ICD-10-CM

## 2011-07-30 ENCOUNTER — Ambulatory Visit (INDEPENDENT_AMBULATORY_CARE_PROVIDER_SITE_OTHER): Payer: Medicare Other | Admitting: Internal Medicine

## 2011-07-30 ENCOUNTER — Encounter: Payer: Self-pay | Admitting: Internal Medicine

## 2011-07-30 VITALS — BP 136/80 | HR 76 | Temp 98.5°F | Resp 16 | Ht 63.0 in | Wt 214.0 lb

## 2011-07-30 DIAGNOSIS — D649 Anemia, unspecified: Secondary | ICD-10-CM

## 2011-07-30 DIAGNOSIS — E038 Other specified hypothyroidism: Secondary | ICD-10-CM

## 2011-07-30 DIAGNOSIS — F329 Major depressive disorder, single episode, unspecified: Secondary | ICD-10-CM

## 2011-07-30 DIAGNOSIS — E119 Type 2 diabetes mellitus without complications: Secondary | ICD-10-CM

## 2011-07-30 DIAGNOSIS — F3289 Other specified depressive episodes: Secondary | ICD-10-CM

## 2011-07-30 DIAGNOSIS — E785 Hyperlipidemia, unspecified: Secondary | ICD-10-CM

## 2011-07-30 DIAGNOSIS — I10 Essential (primary) hypertension: Secondary | ICD-10-CM

## 2011-07-30 LAB — LIPID PANEL
Total CHOL/HDL Ratio: 4
Triglycerides: 134 mg/dL (ref 0.0–149.0)
VLDL: 26.8 mg/dL (ref 0.0–40.0)

## 2011-07-30 LAB — CBC WITH DIFFERENTIAL/PLATELET
Basophils Absolute: 0 10*3/uL (ref 0.0–0.1)
Basophils Relative: 0.5 % (ref 0.0–3.0)
Eosinophils Absolute: 0.4 10*3/uL (ref 0.0–0.7)
Lymphocytes Relative: 22 % (ref 12.0–46.0)
MCHC: 33.3 g/dL (ref 30.0–36.0)
Neutrophils Relative %: 62 % (ref 43.0–77.0)
Platelets: 180 10*3/uL (ref 150.0–400.0)
RBC: 4.76 Mil/uL (ref 3.87–5.11)

## 2011-07-30 LAB — HEPATIC FUNCTION PANEL
Alkaline Phosphatase: 110 U/L (ref 39–117)
Bilirubin, Direct: 0.1 mg/dL (ref 0.0–0.3)
Total Protein: 6.9 g/dL (ref 6.0–8.3)

## 2011-07-30 LAB — LDL CHOLESTEROL, DIRECT: Direct LDL: 144.9 mg/dL

## 2011-07-30 LAB — HEMOGLOBIN A1C: Hgb A1c MFr Bld: 6.1 % (ref 4.6–6.5)

## 2011-07-30 NOTE — Patient Instructions (Addendum)
The patient is instructed to continue all medications as prescribed. Schedule followup with check out clerk upon leaving the clinic  

## 2011-07-30 NOTE — Progress Notes (Signed)
Subjective:    Patient ID: [name], female    DOB: 27-Feb-1940, 71 y.o.   MRN: [id number]  HPI Patient is 71 year old female who presents for hypothyroidism hypertension history of anemia for routine followup today.  Her blood pressure was 136/80 He is on Armour Thyroid for thyroid replacement she takes omega-3 fatty acids for cholesterol control she is on pristiq and she is on multiple vitamins for vitamin support including a higher and supplementation for iron loss anemia.  Today we will look at her hemoglobin A1c for CBC with differential her iron level as well as her thyroid   Review of Systems  Constitutional: Negative for activity change, appetite change and fatigue.  HENT: Negative for ear pain, congestion, neck pain, postnasal drip and sinus pressure.   Eyes: Negative for redness and visual disturbance.  Respiratory: Negative for cough, shortness of breath and wheezing.   Gastrointestinal: Negative for abdominal pain and abdominal distention.  Genitourinary: Negative for dysuria, frequency and menstrual problem.  Musculoskeletal: Negative for myalgias, joint swelling and arthralgias.  Skin: Negative for rash and wound.  Neurological: Negative for dizziness, weakness and headaches.  Hematological: Negative for adenopathy. Does not bruise/bleed easily.  Psychiatric/Behavioral: Negative for disturbed wake/sleep cycle and decreased concentration.   Past Medical History  Diagnosis Date  . Asthma   . Depression   . Hypertension   . Vitamin B 12 deficiency   . Anemia   . Thyroid disease     History   Social History  . Marital Status: Married    Spouse Name: N/A    Number of Children: N/A  . Years of Education: N/A   Occupational History  . retired Engineer, civil (consulting)    Social History Main Topics  . Smoking status: Former Games developer  . Smokeless tobacco: Not on file  . Alcohol Use: 1.2 oz/week    2 Glasses of wine per week  . Drug Use: Not on file  . Sexually Active: Not  Currently   Other Topics Concern  . Not on file   Social History Narrative  . No narrative on file    Past Surgical History  Procedure Date  . Abdominal hysterectomy   . Oophorectomy   . Bariatric surgery   . Pantallor arthrodesis with rod placement left foot     Family History  Problem Relation Age of Onset  . Stroke Mother   . Liver cancer Father   . Cancer Father     liver    Allergies  Allergen Reactions  . Ace Inhibitors   . Azithromycin     REACTION: Rash  . Codeine     REACTION: Upset stomach    Current Outpatient Prescriptions on File Prior to Visit  Medication Sig Dispense Refill  . ALPRAZolam (XANAX) 0.5 MG tablet Take 1 tablet (0.5 mg total) by mouth 3 (three) times daily as needed.  30 tablet  6  . APAP-Salicyl-Phenyltolox-Caff (CAFGESIC) 325-250-20-50 MG CAPS Take by mouth every 6 (six) hours as needed.        . Cholecalciferol (VITAMIN D) 1000 UNITS capsule Take 1,000 Units by mouth 2 (two) times daily.        . cyanocobalamin (,VITAMIN B-12,) 1000 MCG/ML injection INJECT INTRAMUSCULARLY ONCE A MONTH.  10 mL  1  . cyclobenzaprine (FLEXERIL) 10 MG tablet Take 1 tablet (10 mg total) by mouth 3 (three) times daily as needed.  30 tablet  6  . desvenlafaxine (PRISTIQ) 100 MG 24 hr tablet Take 1 tablet (100  mg total) by mouth daily.  90 tablet  3  . diclofenac (VOLTAREN) 75 MG EC tablet Take 1 tablet (75 mg total) by mouth 2 (two) times daily. ALWAYS ORDER WATSON BRAND  180 tablet  2  . FeFum-FePo-FA-B Cmp-C-Zn-Mn-Cu (TANDEM PLUS) 162-115.2-1 MG CAPS TAKE ONE CAPSULE BY MOUTH TWICE DAILY  180 each  2  . fluticasone (FLONASE) 50 MCG/ACT nasal spray 2 sprays by Nasal route daily.        Marland Kitchen glucose blood (PRECISION XTRA TEST STRIPS) test strip 1 each by Other route as needed. Use as instructed       . montelukast (SINGULAIR) 10 MG tablet Take 1 tablet (10 mg total) by mouth at bedtime.  30 tablet  3  . Omega-3 Fatty Acids (FISH OIL) 1000 MG CAPS Take by mouth 2  (two) times daily.        Marland Kitchen thyroid (ARMOUR) 240 MG tablet Take 1 tablet (240 mg total) by mouth daily.  30 tablet  3    BP 136/80  Pulse 76  Temp 98.5 F (36.9 C)  Resp 16  Ht 5\' 3"  (1.6 m)  Wt 214 lb (97.07 kg)  BMI 37.91 kg/m2        Objective:   Physical Exam  Nursing note and vitals reviewed. Constitutional: She is oriented to person, place, and time. She appears well-developed and well-nourished. No distress.  HENT:  Head: Normocephalic and atraumatic.  Right Ear: External ear normal.  Left Ear: External ear normal.  Nose: Nose normal.  Mouth/Throat: Oropharynx is clear and moist.  Eyes: Conjunctivae and EOM are normal. Pupils are equal, round, and reactive to light.  Neck: Normal range of motion. Neck supple. No JVD present. No tracheal deviation present. No thyromegaly present.  Cardiovascular: Normal rate, regular rhythm, normal heart sounds and intact distal pulses.   No murmur heard. Pulmonary/Chest: Effort normal and breath sounds normal. She has no wheezes. She exhibits no tenderness.  Abdominal: Soft. Bowel sounds are normal.  Musculoskeletal: Normal range of motion. She exhibits no edema and no tenderness.  Lymphadenopathy:    She has no cervical adenopathy.  Neurological: She is alert and oriented to person, place, and time. She has normal reflexes. No cranial nerve deficit.  Skin: Skin is warm and dry. She is not diaphoretic.  Psychiatric: She has a normal mood and affect. Her behavior is normal.          Assessment & Plan:  Monitoring TSH and bmet for chronic problems Asthma stable Stable HTN Monitor anemia labs

## 2011-08-19 ENCOUNTER — Other Ambulatory Visit: Payer: Self-pay | Admitting: *Deleted

## 2011-08-19 DIAGNOSIS — F329 Major depressive disorder, single episode, unspecified: Secondary | ICD-10-CM

## 2011-08-19 DIAGNOSIS — F3289 Other specified depressive episodes: Secondary | ICD-10-CM

## 2011-08-19 MED ORDER — DESVENLAFAXINE SUCCINATE ER 100 MG PO TB24: 100.0000 mg | ORAL_TABLET | Freq: Every day | ORAL | Status: DC

## 2011-10-21 ENCOUNTER — Ambulatory Visit: Payer: Medicare Other | Attending: Orthopedic Surgery | Admitting: Physical Therapy

## 2011-10-21 DIAGNOSIS — IMO0001 Reserved for inherently not codable concepts without codable children: Secondary | ICD-10-CM | POA: Insufficient documentation

## 2011-10-21 DIAGNOSIS — M25676 Stiffness of unspecified foot, not elsewhere classified: Secondary | ICD-10-CM | POA: Insufficient documentation

## 2011-10-21 DIAGNOSIS — M25579 Pain in unspecified ankle and joints of unspecified foot: Secondary | ICD-10-CM | POA: Insufficient documentation

## 2011-10-21 DIAGNOSIS — M25673 Stiffness of unspecified ankle, not elsewhere classified: Secondary | ICD-10-CM | POA: Insufficient documentation

## 2011-10-24 ENCOUNTER — Ambulatory Visit: Payer: Medicare Other | Attending: Orthopedic Surgery | Admitting: Physical Therapy

## 2011-10-24 DIAGNOSIS — M25579 Pain in unspecified ankle and joints of unspecified foot: Secondary | ICD-10-CM | POA: Insufficient documentation

## 2011-10-24 DIAGNOSIS — M25676 Stiffness of unspecified foot, not elsewhere classified: Secondary | ICD-10-CM | POA: Insufficient documentation

## 2011-10-24 DIAGNOSIS — IMO0001 Reserved for inherently not codable concepts without codable children: Secondary | ICD-10-CM | POA: Insufficient documentation

## 2011-10-24 DIAGNOSIS — M25673 Stiffness of unspecified ankle, not elsewhere classified: Secondary | ICD-10-CM | POA: Insufficient documentation

## 2011-10-28 ENCOUNTER — Ambulatory Visit: Payer: Medicare Other

## 2011-10-31 ENCOUNTER — Ambulatory Visit: Payer: Medicare Other | Admitting: Physical Therapy

## 2011-11-04 ENCOUNTER — Ambulatory Visit: Payer: Medicare Other | Admitting: Physical Therapy

## 2011-11-07 ENCOUNTER — Ambulatory Visit: Payer: Medicare Other | Admitting: Physical Therapy

## 2011-11-07 ENCOUNTER — Other Ambulatory Visit: Payer: Self-pay | Admitting: Internal Medicine

## 2011-11-08 ENCOUNTER — Telehealth: Payer: Self-pay | Admitting: Internal Medicine

## 2011-11-08 DIAGNOSIS — E039 Hypothyroidism, unspecified: Secondary | ICD-10-CM

## 2011-11-08 NOTE — Telephone Encounter (Signed)
Ok per Dr Jenkins, referral order placed 

## 2011-11-08 NOTE — Telephone Encounter (Signed)
PT would like a referral to Dr Altheimer for tsh

## 2011-11-11 ENCOUNTER — Ambulatory Visit: Payer: Medicare Other | Admitting: Physical Therapy

## 2011-11-14 ENCOUNTER — Telehealth: Payer: Self-pay | Admitting: Internal Medicine

## 2011-11-14 ENCOUNTER — Ambulatory Visit (INDEPENDENT_AMBULATORY_CARE_PROVIDER_SITE_OTHER): Payer: Medicare Other

## 2011-11-14 ENCOUNTER — Encounter: Payer: Medicare Other | Admitting: Physical Therapy

## 2011-11-14 DIAGNOSIS — Z23 Encounter for immunization: Secondary | ICD-10-CM

## 2011-11-14 NOTE — Telephone Encounter (Signed)
Patient came in for flu shot today, she would like to be referred to an Endocrinologist.  Her specific request was for Dr. Leslie Dales at [facility]. Please advise. Thank you.

## 2011-11-15 ENCOUNTER — Other Ambulatory Visit: Payer: Self-pay | Admitting: *Deleted

## 2011-11-15 DIAGNOSIS — E039 Hypothyroidism, unspecified: Secondary | ICD-10-CM

## 2011-11-15 NOTE — Telephone Encounter (Signed)
done

## 2011-11-15 NOTE — Telephone Encounter (Signed)
May refer 

## 2011-11-18 ENCOUNTER — Encounter: Payer: Medicare Other | Admitting: Physical Therapy

## 2011-11-19 ENCOUNTER — Telehealth: Payer: Self-pay | Admitting: Internal Medicine

## 2011-11-19 NOTE — Telephone Encounter (Signed)
Patient calling with pain on urination and foul smelling urine.  Sx started today.  No fever.  No blood in the urine.  Also has a h/a and lower back pain.  Triaged per Urinary Sx.  Needs to be seen in 24 hours.  No appts. with any provider this afternoon.  Scheduled for 0915 on Wednesday 10/30 with Ms. Orvan Falconer.   Encouraged per push liquids and take Tylenol q4 hours for the discomfort.

## 2011-11-20 ENCOUNTER — Encounter: Payer: Self-pay | Admitting: Family

## 2011-11-20 ENCOUNTER — Ambulatory Visit (INDEPENDENT_AMBULATORY_CARE_PROVIDER_SITE_OTHER): Payer: Medicare Other | Admitting: Family

## 2011-11-20 VITALS — BP 130/90 | HR 79 | Temp 98.9°F | Wt 228.0 lb

## 2011-11-20 DIAGNOSIS — R350 Frequency of micturition: Secondary | ICD-10-CM

## 2011-11-20 DIAGNOSIS — R300 Dysuria: Secondary | ICD-10-CM

## 2011-11-20 LAB — POCT URINALYSIS DIPSTICK
Ketones, UA: NEGATIVE
Nitrite, UA: NEGATIVE
Protein, UA: NEGATIVE
Urobilinogen, UA: 0.2
pH, UA: 7

## 2011-11-20 MED ORDER — NITROFURANTOIN MONOHYD MACRO 100 MG PO CAPS: 100.0000 mg | ORAL_CAPSULE | Freq: Two times a day (BID) | ORAL | Status: DC

## 2011-11-20 NOTE — Progress Notes (Signed)
Subjective:    Patient ID: [name], female    DOB: 23-Feb-1940, 71 y.o.   MRN: [id number]  HPI  71 year old white female, nonsmoker, patient of Dr. Lovell Sheehan is in with complaints of urinary frequency, urgency, burning with urination x2 days. She's increased her intake of water and actually feels better today. Has not taken any medication for relief. Has a history of urinary tract infections.  Review of Systems  Constitutional: Negative.   Respiratory: Negative.   Cardiovascular: Negative.   Gastrointestinal: Negative.   Genitourinary: Positive for dysuria, urgency and frequency. Negative for hematuria.  Musculoskeletal: Positive for back pain.  Skin: Negative.   Psychiatric/Behavioral: Negative.    Past Medical History  Diagnosis Date  . Asthma   . Depression   . Hypertension   . Vitamin B 12 deficiency   . Anemia   . Thyroid disease     History   Social History  . Marital Status: Married    Spouse Name: N/A    Number of Children: N/A  . Years of Education: N/A   Occupational History  . retired Engineer, civil (consulting)    Social History Main Topics  . Smoking status: Former Games developer  . Smokeless tobacco: Not on file  . Alcohol Use: 1.2 oz/week    2 Glasses of wine per week  . Drug Use: Not on file  . Sexually Active: Not Currently   Other Topics Concern  . Not on file   Social History Narrative  . No narrative on file    Past Surgical History  Procedure Date  . Abdominal hysterectomy   . Oophorectomy   . Bariatric surgery   . Pantallor arthrodesis with rod placement left foot     Family History  Problem Relation Age of Onset  . Stroke Mother   . Liver cancer Father   . Cancer Father     liver    Allergies  Allergen Reactions  . Ace Inhibitors   . Azithromycin     REACTION: Rash  . Codeine     REACTION: Upset stomach    Current Outpatient Prescriptions on File Prior to Visit  Medication Sig Dispense Refill  . ALPRAZolam (XANAX) 0.5 MG tablet Take 1  tablet (0.5 mg total) by mouth 3 (three) times daily as needed.  30 tablet  6  . APAP-Salicyl-Phenyltolox-Caff (CAFGESIC) 325-250-20-50 MG CAPS Take by mouth every 6 (six) hours as needed.        . Cholecalciferol (VITAMIN D) 1000 UNITS capsule Take 1,000 Units by mouth 2 (two) times daily.        . cyanocobalamin (,VITAMIN B-12,) 1000 MCG/ML injection INJECT INTRAMUSCULARLY ONCE A MONTH.  10 mL  1  . cyclobenzaprine (FLEXERIL) 10 MG tablet Take 1 tablet (10 mg total) by mouth 3 (three) times daily as needed.  30 tablet  6  . desvenlafaxine (PRISTIQ) 100 MG 24 hr tablet Take 1 tablet (100 mg total) by mouth daily.  90 tablet  3  . diclofenac (VOLTAREN) 75 MG EC tablet Take 1 tablet (75 mg total) by mouth 2 (two) times daily. ALWAYS ORDER WATSON BRAND  180 tablet  2  . FeFum-FePo-FA-B Cmp-C-Zn-Mn-Cu (TANDEM PLUS) 162-115.2-1 MG CAPS TAKE ONE CAPSULE BY MOUTH TWICE DAILY  180 each  1  . fluticasone (FLONASE) 50 MCG/ACT nasal spray 2 sprays by Nasal route daily.        Marland Kitchen glucose blood (PRECISION XTRA TEST STRIPS) test strip 1 each by Other route as needed. Use as  instructed       . montelukast (SINGULAIR) 10 MG tablet Take 1 tablet (10 mg total) by mouth at bedtime.  30 tablet  3  . Omega-3 Fatty Acids (FISH OIL) 1000 MG CAPS Take by mouth 2 (two) times daily.        Marland Kitchen thyroid (ARMOUR) 240 MG tablet Take 1 tablet (240 mg total) by mouth daily.  30 tablet  3    BP 130/90  Pulse 79  Temp 98.9 F (37.2 C) (Oral)  Wt 228 lb (103.42 kg)  SpO2 96%chart    Objective:   Physical Exam  Constitutional: She is oriented to person, place, and time. She appears well-developed and well-nourished.  Cardiovascular: Normal rate, regular rhythm and normal heart sounds.   Pulmonary/Chest: Effort normal and breath sounds normal.  Abdominal: Soft. Bowel sounds are normal.  Neurological: She is alert and oriented to person, place, and time.  Skin: Skin is warm and dry.  Psychiatric: She has a normal mood and  affect.          Assessment & Plan:  Assessment: Urinary tract infection, urinary frequency, dysuria  Plan: Urine culture sent. Macrobid 100 mg one tablet twice a day x7 days. Avoid caffeine. Patient call the office if symptoms worsen or persist. Recheck as scheduled and when necessary.

## 2011-11-20 NOTE — Patient Instructions (Signed)
Urinary Tract Infection Urinary tract infections (UTIs) can develop anywhere along your urinary tract. Your urinary tract is your body's drainage system for removing wastes and extra water. Your urinary tract includes two kidneys, two ureters, a bladder, and a urethra. Your kidneys are a pair of bean-shaped organs. Each kidney is about the size of your fist. They are located below your ribs, one on each side of your spine. CAUSES Infections are caused by microbes, which are microscopic organisms, including fungi, viruses, and bacteria. These organisms are so small that they can only be seen through a microscope. Bacteria are the microbes that most commonly cause UTIs. SYMPTOMS  Symptoms of UTIs may vary by age and gender of the patient and by the location of the infection. Symptoms in young women typically include a frequent and intense urge to urinate and a painful, burning feeling in the bladder or urethra during urination. Older women and men are more likely to be tired, shaky, and weak and have muscle aches and abdominal pain. A fever may mean the infection is in your kidneys. Other symptoms of a kidney infection include pain in your back or sides below the ribs, nausea, and vomiting. DIAGNOSIS To diagnose a UTI, your caregiver will ask you about your symptoms. Your caregiver also will ask to provide a urine sample. The urine sample will be tested for bacteria and white blood cells. White blood cells are made by your body to help fight infection. TREATMENT  Typically, UTIs can be treated with medication. Because most UTIs are caused by a bacterial infection, they usually can be treated with the use of antibiotics. The choice of antibiotic and length of treatment depend on your symptoms and the type of bacteria causing your infection. HOME CARE INSTRUCTIONS  If you were prescribed antibiotics, take them exactly as your caregiver instructs you. Finish the medication even if you feel better after you  have only taken some of the medication.  Drink enough water and fluids to keep your urine clear or pale yellow.  Avoid caffeine, tea, and carbonated beverages. They tend to irritate your bladder.  Empty your bladder often. Avoid holding urine for long periods of time.  Empty your bladder before and after sexual intercourse.  After a bowel movement, women should cleanse from front to back. Use each tissue only once. SEEK MEDICAL CARE IF:   You have back pain.  You develop a fever.  Your symptoms do not begin to resolve within 3 days. SEEK IMMEDIATE MEDICAL CARE IF:   You have severe back pain or lower abdominal pain.  You develop chills.  You have nausea or vomiting.  You have continued burning or discomfort with urination. MAKE SURE YOU:   Understand these instructions.  Will watch your condition.  Will get help right away if you are not doing well or get worse. Document Released: 10/17/2004 Document Revised: 07/09/2011 Document Reviewed: 02/15/2011 ExitCare Patient Information 2013 [employer].  

## 2011-11-21 ENCOUNTER — Encounter: Payer: Medicare Other | Admitting: Physical Therapy

## 2012-01-31 ENCOUNTER — Encounter: Payer: Self-pay | Admitting: Internal Medicine

## 2012-01-31 ENCOUNTER — Ambulatory Visit (INDEPENDENT_AMBULATORY_CARE_PROVIDER_SITE_OTHER): Payer: Medicare Other | Admitting: Internal Medicine

## 2012-01-31 VITALS — BP 140/90 | HR 72 | Temp 98.3°F | Resp 16 | Ht 63.0 in | Wt 236.0 lb

## 2012-01-31 DIAGNOSIS — D518 Other vitamin B12 deficiency anemias: Secondary | ICD-10-CM

## 2012-01-31 DIAGNOSIS — I10 Essential (primary) hypertension: Secondary | ICD-10-CM

## 2012-01-31 DIAGNOSIS — D519 Vitamin B12 deficiency anemia, unspecified: Secondary | ICD-10-CM

## 2012-01-31 DIAGNOSIS — E039 Hypothyroidism, unspecified: Secondary | ICD-10-CM

## 2012-01-31 DIAGNOSIS — J45909 Unspecified asthma, uncomplicated: Secondary | ICD-10-CM

## 2012-01-31 LAB — CBC WITH DIFFERENTIAL/PLATELET
Basophils Absolute: 0.1 K/uL (ref 0.0–0.1)
Basophils Relative: 1 % (ref 0.0–3.0)
Eosinophils Absolute: 0.4 K/uL (ref 0.0–0.7)
Eosinophils Relative: 7.9 % — ABNORMAL HIGH (ref 0.0–5.0)
HCT: 43.4 % (ref 36.0–46.0)
Hemoglobin: 14.4 g/dL (ref 12.0–15.0)
Lymphocytes Relative: 23.1 % (ref 12.0–46.0)
Lymphs Abs: 1.2 K/uL (ref 0.7–4.0)
MCHC: 33.2 g/dL (ref 30.0–36.0)
MCV: 88.2 fl (ref 78.0–100.0)
Monocytes Absolute: 0.5 K/uL (ref 0.1–1.0)
Monocytes Relative: 9 % (ref 3.0–12.0)
Neutro Abs: 3.1 K/uL (ref 1.4–7.7)
Neutrophils Relative %: 59 % (ref 43.0–77.0)
Platelets: 190 K/uL (ref 150.0–400.0)
RBC: 4.92 Mil/uL (ref 3.87–5.11)
RDW: 13.5 % (ref 11.5–14.6)
WBC: 5.3 K/uL (ref 4.5–10.5)

## 2012-01-31 LAB — VITAMIN B12: Vitamin B-12: >1500 pg/mL — ABNORMAL HIGH (ref 211–911)

## 2012-01-31 LAB — BASIC METABOLIC PANEL
CO2: 28 mEq/L (ref 19–32)
Calcium: 10.4 mg/dL (ref 8.4–10.5)
Creatinine, Ser: 0.9 mg/dL (ref 0.4–1.2)
GFR: 65.44 mL/min (ref >60.00)
Sodium: 137 mEq/L (ref 135–145)

## 2012-01-31 MED ORDER — CYANOCOBALAMIN 1000 MCG/ML IJ SOLN: 1000.0000 ug | INTRAMUSCULAR | Status: DC

## 2012-01-31 NOTE — Progress Notes (Signed)
Subjective:    Patient ID: [name], female    DOB: 05/06/1940, 72 y.o.   MRN: [id number]  HPI Seeing endocrine and has been changed to Tirosint Was having mild "declines" in the afternoon Stable asthma soill dealing with grief reaction Fall in October with shoulder injury and may need Replacement     Review of Systems  Constitutional: Positive for fatigue. Negative for activity change and appetite change.  HENT: Negative for ear pain, congestion, neck pain, postnasal drip and sinus pressure.   Eyes: Negative for redness and visual disturbance.  Respiratory: Negative for cough, shortness of breath and wheezing.   Gastrointestinal: Negative for abdominal pain and abdominal distention.  Genitourinary: Negative for dysuria, frequency and menstrual problem.  Musculoskeletal: Positive for myalgias and joint swelling. Negative for arthralgias.  Skin: Negative for rash and wound.  Neurological: Negative for dizziness, weakness and headaches.  Hematological: Negative for adenopathy. Does not bruise/bleed easily.  Psychiatric/Behavioral: Negative for sleep disturbance and decreased concentration.       Past Medical History  Diagnosis Date  . Asthma   . Depression   . Hypertension   . Vitamin B 12 deficiency   . Anemia   . Thyroid disease     History   Social History  . Marital Status: Married    Spouse Name: N/A    Number of Children: N/A  . Years of Education: N/A   Occupational History  . retired Engineer, civil (consulting)    Social History Main Topics  . Smoking status: Former Games developer  . Smokeless tobacco: Not on file  . Alcohol Use: 1.2 oz/week    2 Glasses of wine per week  . Drug Use: Not on file  . Sexually Active: Not Currently   Other Topics Concern  . Not on file   Social History Narrative  . No narrative on file    Past Surgical History  Procedure Date  . Abdominal hysterectomy   . Oophorectomy   . Bariatric surgery   . Pantallor arthrodesis with rod  placement left foot     Family History  Problem Relation Age of Onset  . Stroke Mother   . Liver cancer Father   . Cancer Father     liver    Allergies  Allergen Reactions  . Ace Inhibitors   . Azithromycin     REACTION: Rash  . Codeine     REACTION: Upset stomach    Current Outpatient Prescriptions on File Prior to Visit  Medication Sig Dispense Refill  . Levothyroxine Sodium (TIROSINT) 100 MCG CAPS Take 200 mcg by mouth daily.      Marland Kitchen ALPRAZolam (XANAX) 0.5 MG tablet Take 1 tablet (0.5 mg total) by mouth 3 (three) times daily as needed.  30 tablet  6  . APAP-Salicyl-Phenyltolox-Caff (CAFGESIC) 325-250-20-50 MG CAPS Take by mouth every 6 (six) hours as needed.        . Cholecalciferol (VITAMIN D) 1000 UNITS capsule Take 1,000 Units by mouth 2 (two) times daily.        . cyclobenzaprine (FLEXERIL) 10 MG tablet Take 1 tablet (10 mg total) by mouth 3 (three) times daily as needed.  30 tablet  6  . desvenlafaxine (PRISTIQ) 100 MG 24 hr tablet Take 1 tablet (100 mg total) by mouth daily.  90 tablet  3  . diclofenac (VOLTAREN) 75 MG EC tablet Take 1 tablet (75 mg total) by mouth 2 (two) times daily. ALWAYS ORDER WATSON BRAND  180 tablet  2  .  FeFum-FePo-FA-B Cmp-C-Zn-Mn-Cu (TANDEM PLUS) 162-115.2-1 MG CAPS TAKE ONE CAPSULE BY MOUTH TWICE DAILY  180 each  1  . fluticasone (FLONASE) 50 MCG/ACT nasal spray 2 sprays by Nasal route daily.        Marland Kitchen glucose blood (PRECISION XTRA TEST STRIPS) test strip 1 each by Other route as needed. Use as instructed       . montelukast (SINGULAIR) 10 MG tablet Take 1 tablet (10 mg total) by mouth at bedtime.  30 tablet  3  . Omega-3 Fatty Acids (FISH OIL) 1000 MG CAPS Take by mouth 2 (two) times daily.          BP 140/90  Pulse 72  Temp 98.3 F (36.8 C)  Resp 16  Ht 5\' 3"  (1.6 m)  Wt 236 lb (107.049 kg)  BMI 41.81 kg/m2    Objective:   Physical Exam  Nursing note and vitals reviewed. Constitutional: She is oriented to person, place, and time.  She appears well-developed and well-nourished. No distress.       obese  HENT:  Head: Normocephalic and atraumatic.  Right Ear: External ear normal.  Left Ear: External ear normal.  Nose: Nose normal.  Mouth/Throat: Oropharynx is clear and moist.  Eyes: Conjunctivae normal and EOM are normal. Pupils are equal, round, and reactive to light.  Neck: Normal range of motion. Neck supple. No JVD present. No tracheal deviation present. No thyromegaly present.  Cardiovascular: Normal rate, regular rhythm, normal heart sounds and intact distal pulses.   No murmur heard. Pulmonary/Chest: Effort normal and breath sounds normal. She has no wheezes. She exhibits no tenderness.  Abdominal: Soft. Bowel sounds are normal.  Musculoskeletal: Normal range of motion. She exhibits edema and tenderness.  Lymphadenopathy:    She has no cervical adenopathy.  Neurological: She is alert and oriented to person, place, and time. She has normal reflexes. No cranial nerve deficit.  Skin: Skin is warm and dry. She is not diaphoretic.  Psychiatric: She has a normal mood and affect. Her behavior is normal.          Assessment & Plan:  Monitor chronic issues anemia and bmet Thyroid monitored by endocrine Vit b 12 dosing at home

## 2012-01-31 NOTE — Patient Instructions (Signed)
The patient is instructed to continue all medications as prescribed. Schedule followup with check out clerk upon leaving the clinic  

## 2012-02-25 ENCOUNTER — Telehealth: Payer: Self-pay | Admitting: Internal Medicine

## 2012-02-25 NOTE — Telephone Encounter (Signed)
Pt was looking on my chart and vit b 12 level was high. Pt would like to know should she having inj this month. Pt is aware MD out of office today

## 2012-02-26 NOTE — Telephone Encounter (Signed)
Was given around t he first of the month,- per dr Lovell Sheehan- continue to take because it will drop as month progresses

## 2012-02-27 ENCOUNTER — Encounter (HOSPITAL_COMMUNITY): Payer: Self-pay | Admitting: Pharmacy Technician

## 2012-03-04 ENCOUNTER — Encounter (HOSPITAL_COMMUNITY)
Admission: RE | Admit: 2012-03-04 | Discharge: 2012-03-04 | Disposition: A | Discharge status: Home / Self Care | Payer: Medicare Other | Source: Ambulatory Visit | Attending: Orthopedic Surgery | Admitting: Orthopedic Surgery

## 2012-03-04 ENCOUNTER — Encounter (HOSPITAL_COMMUNITY)
Admission: RE | Admit: 2012-03-04 | Discharge: 2012-03-04 | Disposition: A | Discharge status: Home / Self Care | Payer: Medicare Other | Source: Ambulatory Visit | Attending: Anesthesiology | Admitting: Anesthesiology

## 2012-03-04 ENCOUNTER — Encounter (HOSPITAL_COMMUNITY): Payer: Self-pay

## 2012-03-04 DIAGNOSIS — Z01818 Encounter for other preprocedural examination: Secondary | ICD-10-CM | POA: Insufficient documentation

## 2012-03-04 DIAGNOSIS — Z01810 Encounter for preprocedural cardiovascular examination: Secondary | ICD-10-CM | POA: Insufficient documentation

## 2012-03-04 DIAGNOSIS — Z01812 Encounter for preprocedural laboratory examination: Secondary | ICD-10-CM | POA: Insufficient documentation

## 2012-03-04 DIAGNOSIS — E119 Type 2 diabetes mellitus without complications: Secondary | ICD-10-CM | POA: Insufficient documentation

## 2012-03-04 DIAGNOSIS — I10 Essential (primary) hypertension: Secondary | ICD-10-CM | POA: Insufficient documentation

## 2012-03-04 HISTORY — DX: Unspecified osteoarthritis, unspecified site: M19.90

## 2012-03-04 HISTORY — DX: Hypothyroidism, unspecified: E03.9

## 2012-03-04 HISTORY — DX: Sleep disorder, unspecified: G47.9

## 2012-03-04 HISTORY — DX: Type 2 diabetes mellitus without complications: E11.9

## 2012-03-04 LAB — CBC
Platelets: 182 10*3/uL (ref 150–400)
RBC: 5.08 MIL/uL (ref 3.87–5.11)
RDW: 13.3 % (ref 11.5–15.5)
WBC: 7.6 10*3/uL (ref 4.0–10.5)

## 2012-03-04 LAB — SURGICAL PCR SCREEN: MRSA, PCR: NEGATIVE

## 2012-03-04 LAB — BASIC METABOLIC PANEL
Calcium: 11 mg/dL — ABNORMAL HIGH (ref 8.4–10.5)
GFR calc non Af Amer: 65 mL/min — ABNORMAL LOW (ref >90)
Sodium: 137 mEq/L (ref 135–145)

## 2012-03-04 LAB — TYPE AND SCREEN: ABO/RH(D): A POS

## 2012-03-04 NOTE — Progress Notes (Signed)
Pt. Unsure when she had last ekg, states she had a cardiac cath. About 30 yrs. Ago, no care by cardiac since then.

## 2012-03-04 NOTE — Pre-Procedure Instructions (Signed)
MARYLON VERNO  03/04/2012   Your procedure is scheduled on:  Thursday, February 20th  Report to [facility] at 0800 AM.  Call this number if you have problems the morning of surgery: [phone]   Remember:   Do not eat food or drink liquids after midnight.    Take these medicines the morning of surgery with A SIP OF WATER: xanax if needed, flonase if needed, tirosint, singulair   Do not wear jewelry, make-up or nail polish.  Do not wear lotions, powders, or perfumes,deodorant.  Do not shave 48 hours prior to surgery. Men may shave face and neck.  Do not bring valuables to the hospital.  Contacts, dentures or bridgework may not be worn into surgery.  Leave suitcase in the car. After surgery it may be brought to your room.  For patients admitted to the hospital, checkout time is 11:00 AM the day of discharge.   Patients discharged the day of surgery will not be allowed to drive home.    Special Instructions: Shower using CHG 2 nights before surgery and the night before surgery.  If you shower the day of surgery use CHG.  Use special wash - you have one bottle of CHG for all showers.  You should use approximately 1/3 of the bottle for each shower.   Please read over the following fact sheets that you were given: Pain Booklet, Coughing and Deep Breathing, Blood Transfusion Information, MRSA Information and Surgical Site Infection Prevention

## 2012-03-11 MED ORDER — LACTATED RINGERS IV SOLN: INTRAVENOUS | Status: DC

## 2012-03-11 MED ORDER — DEXTROSE 5 % IV SOLN
3.0000 g | INTRAVENOUS | Status: DC
  Filled 2012-03-11: qty 3000

## 2012-03-11 NOTE — Progress Notes (Signed)
Pt called & notified of new arrival time(0530).  Pt verbalized understanding.

## 2012-03-12 ENCOUNTER — Encounter (HOSPITAL_COMMUNITY): Payer: Self-pay | Admitting: Vascular Surgery

## 2012-03-12 ENCOUNTER — Inpatient Hospital Stay (HOSPITAL_COMMUNITY): Payer: Medicare Other | Admitting: Anesthesiology

## 2012-03-12 ENCOUNTER — Encounter (HOSPITAL_COMMUNITY)
Admission: RE | Disposition: A | Discharge status: Skilled Nursing Facility | Payer: Self-pay | Source: Ambulatory Visit | Attending: Orthopedic Surgery

## 2012-03-12 ENCOUNTER — Encounter (HOSPITAL_COMMUNITY): Payer: Self-pay | Admitting: *Deleted

## 2012-03-12 ENCOUNTER — Inpatient Hospital Stay (HOSPITAL_COMMUNITY)
Admission: RE | Admit: 2012-03-12 | Discharge: 2012-03-13 | DRG: 483 | Disposition: A | Discharge status: Skilled Nursing Facility | Payer: Medicare Other | Source: Ambulatory Visit | Attending: Orthopedic Surgery | Admitting: Orthopedic Surgery

## 2012-03-12 DIAGNOSIS — E119 Type 2 diabetes mellitus without complications: Secondary | ICD-10-CM | POA: Diagnosis present

## 2012-03-12 DIAGNOSIS — F329 Major depressive disorder, single episode, unspecified: Secondary | ICD-10-CM | POA: Diagnosis present

## 2012-03-12 DIAGNOSIS — Z9884 Bariatric surgery status: Secondary | ICD-10-CM

## 2012-03-12 DIAGNOSIS — E039 Hypothyroidism, unspecified: Secondary | ICD-10-CM | POA: Diagnosis present

## 2012-03-12 DIAGNOSIS — D649 Anemia, unspecified: Secondary | ICD-10-CM | POA: Diagnosis present

## 2012-03-12 DIAGNOSIS — M1710 Unilateral primary osteoarthritis, unspecified knee: Secondary | ICD-10-CM | POA: Diagnosis present

## 2012-03-12 DIAGNOSIS — F3289 Other specified depressive episodes: Secondary | ICD-10-CM | POA: Diagnosis present

## 2012-03-12 DIAGNOSIS — Z6841 Body Mass Index (BMI) 40.0 and over, adult: Secondary | ICD-10-CM

## 2012-03-12 DIAGNOSIS — Z01812 Encounter for preprocedural laboratory examination: Secondary | ICD-10-CM

## 2012-03-12 DIAGNOSIS — E538 Deficiency of other specified B group vitamins: Secondary | ICD-10-CM | POA: Diagnosis present

## 2012-03-12 DIAGNOSIS — M169 Osteoarthritis of hip, unspecified: Secondary | ICD-10-CM | POA: Diagnosis present

## 2012-03-12 DIAGNOSIS — I10 Essential (primary) hypertension: Secondary | ICD-10-CM | POA: Diagnosis present

## 2012-03-12 DIAGNOSIS — Z79899 Other long term (current) drug therapy: Secondary | ICD-10-CM

## 2012-03-12 DIAGNOSIS — M1610 Unilateral primary osteoarthritis, unspecified hip: Secondary | ICD-10-CM | POA: Diagnosis present

## 2012-03-12 DIAGNOSIS — Z87891 Personal history of nicotine dependence: Secondary | ICD-10-CM

## 2012-03-12 DIAGNOSIS — Z01810 Encounter for preprocedural cardiovascular examination: Secondary | ICD-10-CM

## 2012-03-12 DIAGNOSIS — J45909 Unspecified asthma, uncomplicated: Secondary | ICD-10-CM | POA: Diagnosis present

## 2012-03-12 DIAGNOSIS — Z01818 Encounter for other preprocedural examination: Secondary | ICD-10-CM

## 2012-03-12 DIAGNOSIS — M19019 Primary osteoarthritis, unspecified shoulder: Principal | ICD-10-CM | POA: Diagnosis present

## 2012-03-12 HISTORY — PX: TOTAL SHOULDER ARTHROPLASTY: SHX126

## 2012-03-12 LAB — GLUCOSE, CAPILLARY: Glucose-Capillary: 136 mg/dL — ABNORMAL HIGH (ref 70–99)

## 2012-03-12 SURGERY — ARTHROPLASTY, SHOULDER, TOTAL
Anesthesia: General | Site: Shoulder | Laterality: Left | Wound class: Clean

## 2012-03-12 MED ORDER — OXYCODONE-ACETAMINOPHEN 5-325 MG PO TABS
1.0000 | ORAL_TABLET | ORAL | Status: DC | PRN
  Administered 2012-03-12 – 2012-03-13 (×3): 2 via ORAL
  Filled 2012-03-12 (×3): qty 2

## 2012-03-12 MED ORDER — LACTATED RINGERS IV SOLN
INTRAVENOUS | Status: DC | PRN
  Administered 2012-03-12 (×2): via INTRAVENOUS

## 2012-03-12 MED ORDER — METHOCARBAMOL 500 MG PO TABS
ORAL_TABLET | ORAL | Status: AC
  Filled 2012-03-12: qty 1

## 2012-03-12 MED ORDER — SODIUM CHLORIDE 0.9 % IR SOLN
Status: DC | PRN
  Administered 2012-03-12: 1000 mL

## 2012-03-12 MED ORDER — ZOLPIDEM TARTRATE 5 MG PO TABS: 5.0000 mg | ORAL_TABLET | Freq: Every evening | ORAL | Status: DC | PRN

## 2012-03-12 MED ORDER — DOCUSATE SODIUM 100 MG PO CAPS
100.0000 mg | ORAL_CAPSULE | Freq: Two times a day (BID) | ORAL | Status: DC
  Administered 2012-03-12: 100 mg via ORAL
  Filled 2012-03-12 (×4): qty 1

## 2012-03-12 MED ORDER — LEVOTHYROXINE SODIUM 100 MCG PO CAPS: 200.0000 ug | ORAL_CAPSULE | Freq: Every day | ORAL | Status: DC

## 2012-03-12 MED ORDER — BISACODYL 5 MG PO TBEC: 5.0000 mg | DELAYED_RELEASE_TABLET | Freq: Every day | ORAL | Status: DC | PRN

## 2012-03-12 MED ORDER — CEFAZOLIN SODIUM 1-5 GM-% IV SOLN
1.0000 g | Freq: Four times a day (QID) | INTRAVENOUS | Status: AC
  Administered 2012-03-12 – 2012-03-13 (×3): 1 g via INTRAVENOUS
  Filled 2012-03-12 (×3): qty 50

## 2012-03-12 MED ORDER — METHOCARBAMOL 500 MG PO TABS
500.0000 mg | ORAL_TABLET | Freq: Four times a day (QID) | ORAL | Status: DC | PRN
  Administered 2012-03-12 (×2): 500 mg via ORAL
  Filled 2012-03-12 (×2): qty 1

## 2012-03-12 MED ORDER — MIDAZOLAM HCL 5 MG/5ML IJ SOLN
INTRAMUSCULAR | Status: DC | PRN
  Administered 2012-03-12: 2 mg via INTRAVENOUS

## 2012-03-12 MED ORDER — MONTELUKAST SODIUM 10 MG PO TABS
10.0000 mg | ORAL_TABLET | Freq: Every day | ORAL | Status: DC
  Administered 2012-03-12: 10 mg via ORAL
  Filled 2012-03-12 (×3): qty 1

## 2012-03-12 MED ORDER — HYDROMORPHONE HCL PF 1 MG/ML IJ SOLN
0.2500 mg | INTRAMUSCULAR | Status: DC | PRN
  Administered 2012-03-12 (×3): 0.5 mg via INTRAVENOUS

## 2012-03-12 MED ORDER — LIDOCAINE HCL (CARDIAC) 20 MG/ML IV SOLN
INTRAVENOUS | Status: DC | PRN
  Administered 2012-03-12: 100 mg via INTRAVENOUS

## 2012-03-12 MED ORDER — ONDANSETRON HCL 4 MG PO TABS: 4.0000 mg | ORAL_TABLET | Freq: Four times a day (QID) | ORAL | Status: DC | PRN

## 2012-03-12 MED ORDER — FENTANYL CITRATE 0.05 MG/ML IJ SOLN
INTRAMUSCULAR | Status: DC | PRN
  Administered 2012-03-12 (×2): 50 ug via INTRAVENOUS

## 2012-03-12 MED ORDER — ACETAMINOPHEN 650 MG RE SUPP: 650.0000 mg | Freq: Four times a day (QID) | RECTAL | Status: DC | PRN

## 2012-03-12 MED ORDER — CHLORHEXIDINE GLUCONATE 4 % EX LIQD: 60.0000 mL | Freq: Once | CUTANEOUS | Status: DC

## 2012-03-12 MED ORDER — ALUM & MAG HYDROXIDE-SIMETH 200-200-20 MG/5ML PO SUSP: 30.0000 mL | ORAL | Status: DC | PRN

## 2012-03-12 MED ORDER — METOCLOPRAMIDE HCL 10 MG PO TABS: 5.0000 mg | ORAL_TABLET | Freq: Three times a day (TID) | ORAL | Status: DC | PRN

## 2012-03-12 MED ORDER — METOCLOPRAMIDE HCL 5 MG/ML IJ SOLN: 5.0000 mg | Freq: Three times a day (TID) | INTRAMUSCULAR | Status: DC | PRN

## 2012-03-12 MED ORDER — ROCURONIUM BROMIDE 100 MG/10ML IV SOLN
INTRAVENOUS | Status: DC | PRN
  Administered 2012-03-12: 40 mg via INTRAVENOUS

## 2012-03-12 MED ORDER — TEMAZEPAM 15 MG PO CAPS: 15.0000 mg | ORAL_CAPSULE | Freq: Every evening | ORAL | Status: DC | PRN

## 2012-03-12 MED ORDER — KETOROLAC TROMETHAMINE 15 MG/ML IJ SOLN
15.0000 mg | Freq: Four times a day (QID) | INTRAMUSCULAR | Status: DC
  Administered 2012-03-12 – 2012-03-13 (×4): 15 mg via INTRAVENOUS
  Filled 2012-03-12 (×12): qty 1

## 2012-03-12 MED ORDER — ONDANSETRON HCL 4 MG/2ML IJ SOLN: 4.0000 mg | Freq: Four times a day (QID) | INTRAMUSCULAR | Status: DC | PRN

## 2012-03-12 MED ORDER — FLEET ENEMA 7-19 GM/118ML RE ENEM: 1.0000 | ENEMA | Freq: Once | RECTAL | Status: AC | PRN

## 2012-03-12 MED ORDER — DIPHENHYDRAMINE HCL 12.5 MG/5ML PO ELIX: 12.5000 mg | ORAL_SOLUTION | ORAL | Status: DC | PRN

## 2012-03-12 MED ORDER — PHENOL 1.4 % MT LIQD: 1.0000 | OROMUCOSAL | Status: DC | PRN

## 2012-03-12 MED ORDER — METHOCARBAMOL 100 MG/ML IJ SOLN
500.0000 mg | Freq: Four times a day (QID) | INTRAVENOUS | Status: DC | PRN
  Filled 2012-03-12: qty 5

## 2012-03-12 MED ORDER — PHENYLEPHRINE HCL 10 MG/ML IJ SOLN
10.0000 mg | INTRAVENOUS | Status: DC | PRN
  Administered 2012-03-12: 15 ug/min via INTRAVENOUS

## 2012-03-12 MED ORDER — ALPRAZOLAM 0.5 MG PO TABS
0.5000 mg | ORAL_TABLET | Freq: Three times a day (TID) | ORAL | Status: DC | PRN
  Administered 2012-03-12: 0.5 mg via ORAL
  Filled 2012-03-12: qty 1

## 2012-03-12 MED ORDER — HYDROMORPHONE HCL PF 1 MG/ML IJ SOLN
INTRAMUSCULAR | Status: AC
  Filled 2012-03-12: qty 1

## 2012-03-12 MED ORDER — LEVOTHYROXINE SODIUM 200 MCG PO TABS
200.0000 ug | ORAL_TABLET | Freq: Every day | ORAL | Status: DC
  Administered 2012-03-13: 200 ug via ORAL
  Filled 2012-03-12 (×2): qty 1

## 2012-03-12 MED ORDER — KETOROLAC TROMETHAMINE 30 MG/ML IJ SOLN
INTRAMUSCULAR | Status: AC
  Administered 2012-03-12: 15 mg
  Filled 2012-03-12: qty 1

## 2012-03-12 MED ORDER — HYDROMORPHONE HCL PF 1 MG/ML IJ SOLN
0.2500 mg | INTRAMUSCULAR | Status: DC | PRN
  Administered 2012-03-12: 0.5 mg via INTRAVENOUS
  Filled 2012-03-12: qty 1

## 2012-03-12 MED ORDER — ACETAMINOPHEN 325 MG PO TABS: 650.0000 mg | ORAL_TABLET | Freq: Four times a day (QID) | ORAL | Status: DC | PRN

## 2012-03-12 MED ORDER — ONDANSETRON HCL 4 MG/2ML IJ SOLN
INTRAMUSCULAR | Status: DC | PRN
  Administered 2012-03-12: 4 mg via INTRAVENOUS

## 2012-03-12 MED ORDER — PROPOFOL 10 MG/ML IV BOLUS
INTRAVENOUS | Status: DC | PRN
  Administered 2012-03-12: 200 mg via INTRAVENOUS

## 2012-03-12 MED ORDER — POLYETHYLENE GLYCOL 3350 17 G PO PACK: 17.0000 g | PACK | Freq: Every day | ORAL | Status: DC | PRN

## 2012-03-12 MED ORDER — GLYCOPYRROLATE 0.2 MG/ML IJ SOLN
INTRAMUSCULAR | Status: DC | PRN
  Administered 2012-03-12: 0.4 mg via INTRAVENOUS

## 2012-03-12 MED ORDER — LACTATED RINGERS IV SOLN
INTRAVENOUS | Status: DC
  Administered 2012-03-12: 16:00:00 via INTRAVENOUS

## 2012-03-12 MED ORDER — MENTHOL 3 MG MT LOZG
1.0000 | LOZENGE | OROMUCOSAL | Status: DC | PRN
  Administered 2012-03-12: 3 mg via ORAL
  Filled 2012-03-12: qty 9

## 2012-03-12 MED ORDER — ONDANSETRON HCL 4 MG/2ML IJ SOLN: 4.0000 mg | Freq: Once | INTRAMUSCULAR | Status: DC | PRN

## 2012-03-12 MED ORDER — DESVENLAFAXINE SUCCINATE ER 100 MG PO TB24
100.0000 mg | ORAL_TABLET | Freq: Every day | ORAL | Status: DC
  Administered 2012-03-13: 100 mg via ORAL
  Filled 2012-03-12 (×3): qty 1

## 2012-03-12 SURGICAL SUPPLY — 63 items
BLADE SAW SGTL 83.5X18.5 (BLADE) ×2 IMPLANT
CEMENT BONE DEPUY (Cement) ×2 IMPLANT
CLOTH BEACON ORANGE TIMEOUT ST (SAFETY) ×2 IMPLANT
CLSR STERI-STRIP ANTIMIC 1/2X4 (GAUZE/BANDAGES/DRESSINGS) ×2 IMPLANT
COVER SURGICAL LIGHT HANDLE (MISCELLANEOUS) ×2 IMPLANT
DRAPE INCISE IOBAN 66X45 STRL (DRAPES) ×2 IMPLANT
DRAPE SURG 17X11 SM STRL (DRAPES) ×2 IMPLANT
DRAPE SURG 17X23 STRL (DRAPES) ×2 IMPLANT
DRAPE U-SHAPE 47X51 STRL (DRAPES) ×2 IMPLANT
DRILL BIT 7/64X5 (BIT) IMPLANT
DRSG AQUACEL AG ADV 3.5X10 (GAUZE/BANDAGES/DRESSINGS) ×2 IMPLANT
DRSG MEPILEX BORDER 4X8 (GAUZE/BANDAGES/DRESSINGS) ×2 IMPLANT
DURAPREP 26ML APPLICATOR (WOUND CARE) ×4 IMPLANT
ELECT BLADE 4.0 EZ CLEAN MEGAD (MISCELLANEOUS) ×2
ELECT CAUTERY BLADE 6.4 (BLADE) ×2 IMPLANT
ELECT REM PT RETURN 9FT ADLT (ELECTROSURGICAL) ×2
ELECTRODE BLDE 4.0 EZ CLN MEGD (MISCELLANEOUS) ×1 IMPLANT
ELECTRODE REM PT RTRN 9FT ADLT (ELECTROSURGICAL) ×1 IMPLANT
FACESHIELD LNG OPTICON STERILE (SAFETY) ×6 IMPLANT
GLENOID ANCHOR PEG CROSSLK 44 (Orthopedic Implant) ×2 IMPLANT
GLOVE BIO SURGEON STRL SZ7.5 (GLOVE) ×2 IMPLANT
GLOVE BIO SURGEON STRL SZ8 (GLOVE) ×2 IMPLANT
GLOVE BIOGEL PI IND STRL 6.5 (GLOVE) ×1 IMPLANT
GLOVE BIOGEL PI INDICATOR 6.5 (GLOVE) ×1
GLOVE ECLIPSE 6.5 STRL STRAW (GLOVE) ×2 IMPLANT
GLOVE EUDERMIC 7 POWDERFREE (GLOVE) ×2 IMPLANT
GLOVE SS BIOGEL STRL SZ 7.5 (GLOVE) ×1 IMPLANT
GLOVE SUPERSENSE BIOGEL SZ 7.5 (GLOVE) ×1
GOWN STRL NON-REIN LRG LVL3 (GOWN DISPOSABLE) ×4 IMPLANT
GOWN STRL REIN XL XLG (GOWN DISPOSABLE) ×4 IMPLANT
HEAD HUM ECCENTRIC 44X18 STRL (Trauma) ×2 IMPLANT
HUMERAL STEM 14MM (Trauma) ×2 IMPLANT
KIT BASIN OR (CUSTOM PROCEDURE TRAY) ×2 IMPLANT
KIT ROOM TURNOVER OR (KITS) ×2 IMPLANT
MANIFOLD NEPTUNE II (INSTRUMENTS) ×2 IMPLANT
NDL SUT 6 .5 CRC .975X.05 MAYO (NEEDLE) ×1 IMPLANT
NEEDLE HYPO 25GX1X1/2 BEV (NEEDLE) IMPLANT
NEEDLE MAYO TAPER (NEEDLE) ×1
NS IRRIG 1000ML POUR BTL (IV SOLUTION) ×2 IMPLANT
PACK SHOULDER (CUSTOM PROCEDURE TRAY) ×2 IMPLANT
PAD ARMBOARD 7.5X6 YLW CONV (MISCELLANEOUS) ×4 IMPLANT
PASSER SUT SWANSON 36MM LOOP (INSTRUMENTS) IMPLANT
PIN METAGLENE 2.5 (PIN) ×4 IMPLANT
SLING ARM FOAM STRAP LRG (SOFTGOODS) IMPLANT
SLING ARM FOAM STRAP XLG (SOFTGOODS) ×2 IMPLANT
SMARTMIX MINI TOWER (MISCELLANEOUS) ×2
SPONGE LAP 18X18 X RAY DECT (DISPOSABLE) ×2 IMPLANT
SPONGE LAP 4X18 X RAY DECT (DISPOSABLE) ×2 IMPLANT
STRIP CLOSURE SKIN 1/2X4 (GAUZE/BANDAGES/DRESSINGS) ×2 IMPLANT
SUCTION FRAZIER TIP 10 FR DISP (SUCTIONS) ×2 IMPLANT
SUT BONE WAX W31G (SUTURE) IMPLANT
SUT FIBERWIRE #2 38 T-5 BLUE (SUTURE) ×4
SUT MNCRL AB 3-0 PS2 18 (SUTURE) ×2 IMPLANT
SUT VIC AB 1 CT1 27 (SUTURE) ×3
SUT VIC AB 1 CT1 27XBRD ANBCTR (SUTURE) ×3 IMPLANT
SUT VIC AB 2-0 CT1 27 (SUTURE) ×2
SUT VIC AB 2-0 CT1 TAPERPNT 27 (SUTURE) ×2 IMPLANT
SUTURE FIBERWR #2 38 T-5 BLUE (SUTURE) ×2 IMPLANT
SYR CONTROL 10ML LL (SYRINGE) IMPLANT
TOWEL OR 17X24 6PK STRL BLUE (TOWEL DISPOSABLE) ×2 IMPLANT
TOWEL OR 17X26 10 PK STRL BLUE (TOWEL DISPOSABLE) ×2 IMPLANT
TOWER SMARTMIX MINI (MISCELLANEOUS) ×1 IMPLANT
WATER STERILE IRR 1000ML POUR (IV SOLUTION) ×2 IMPLANT

## 2012-03-12 NOTE — Transfer of Care (Signed)
Immediate Anesthesia Transfer of Care Note  Patient: [name]  Procedure(s) Performed: Procedure(s): TOTAL SHOULDER ARTHROPLASTY (Left)  Patient Location: PACU  Anesthesia Type:General  Level of Consciousness: awake, alert  and oriented  Airway & Oxygen Therapy: Patient Spontanous Breathing and Patient connected to nasal cannula oxygen  Post-op Assessment: Report given to PACU RN, Post -op Vital signs reviewed and stable and Patient moving all extremities  Post vital signs: Reviewed and stable  Complications: No apparent anesthesia complications

## 2012-03-12 NOTE — Preoperative (Signed)
Beta Blockers   Reason not to administer Beta Blockers:Not Applicable 

## 2012-03-12 NOTE — Anesthesia Procedure Notes (Signed)
Anesthesia Regional Block:  Interscalene brachial plexus block  Pre-Anesthetic Checklist: ,, timeout performed, Correct Patient, Correct Site, Correct Laterality, Correct Procedure, Correct Position, site marked, Risks and benefits discussed,  Surgical consent,  Pre-op evaluation,  At surgeon's request and post-op pain management  Laterality: Left  Prep: Maximum Sterile Barrier Precautions used, chloraprep and alcohol swabs       Needles:  Injection technique: Single-shot  Needle Type: Stimulator Needle - 40        Needle insertion depth: 5 cm   Additional Needles:  Procedures: nerve stimulator Interscalene brachial plexus block  Nerve Stimulator or Paresthesia:  Response: 5 mA, 1 ms, 5 cm  Additional Responses:   Narrative:  Start time: 03/12/2012 7:00 AM End time: 03/12/2012 7:10 AM Injection made incrementally with aspirations every 5 mL.  Performed by: Personally  Anesthesiologist: Maren Beach MD  Additional Notes: Pt accepts procedure and risks. 18cc 0.5% Marcaine w/ epi w/o difficulty or discomfort. GES

## 2012-03-12 NOTE — Op Note (Signed)
03/12/2012  9:45 AM  PATIENT:   [name]  72 y.o. female  PRE-OPERATIVE DIAGNOSIS:  Osteoarthritis of the Left Shoulder  POST-OPERATIVE DIAGNOSIS:  same  PROCEDURE:  Left TSA, press fit 14 stem, 44X18 eccentric head, 44 cemented glenoid  SURGEON:  Anjolie Majer, Vania Rea M.D.  ASSISTANTS: Shuford pac   ANESTHESIA:   GET + ISB  EBL: 250  SPECIMEN:  none  Drains: none   PATIENT DISPOSITION:  PACU - hemodynamically stable.    PLAN OF CARE: Admit to inpatient   Dictation# [id number], [phone]

## 2012-03-12 NOTE — Anesthesia Preprocedure Evaluation (Signed)
Anesthesia Evaluation  Patient identified by MRN, date of birth, ID band Patient awake    Airway Mallampati: I TM Distance: >3 FB Neck ROM: full    Dental   Pulmonary asthma , former smoker,          Cardiovascular hypertension, Rhythm:regular Rate:Normal     Neuro/Psych  Headaches,    GI/Hepatic   Endo/Other  diabetes, Type 2, Oral Hypoglycemic AgentsHypothyroidism   Renal/GU      Musculoskeletal   Abdominal   Peds  Hematology   Anesthesia Other Findings   Reproductive/Obstetrics                           Anesthesia Physical Anesthesia Plan  ASA: III  Anesthesia Plan: General   Post-op Pain Management: MAC Combined w/ Regional for Post-op pain   Induction: Intravenous  Airway Management Planned: Oral ETT  Additional Equipment:   Intra-op Plan:   Post-operative Plan: Extubation in OR  Informed Consent: I have reviewed the patients History and Physical, chart, labs and discussed the procedure including the risks, benefits and alternatives for the proposed anesthesia with the patient or authorized representative who has indicated his/her understanding and acceptance.     Plan Discussed with: CRNA, Anesthesiologist and Surgeon  Anesthesia Plan Comments:         Anesthesia Quick Evaluation

## 2012-03-12 NOTE — Progress Notes (Signed)
French Ana Shuford PA here-updated re earlier chest discomfort.Pt  States discomfort gone now.

## 2012-03-12 NOTE — Op Note (Signed)
NAMEMERIDETH, BOSQUE NO.:  [id number]  MEDICAL RECORD NO.:  [id number]  LOCATION:  5N24C                        FACILITY:  [facility]  PHYSICIAN:  Vania Rea. Koleson Reifsteck, M.D.  DATE OF BIRTH:  04/07/1940  DATE OF PROCEDURE:  03/12/2012 DATE OF DISCHARGE:                              OPERATIVE REPORT   PREOPERATIVE DIAGNOSIS:  End-stage left shoulder osteoarthritis.  POSTOPERATIVE DIAGNOSIS:  End-stage left shoulder osteoarthritis.  PROCEDURE:  Left total shoulder arthroplasty.  SURGEON:  Vania Rea. Diesha Rostad, MD  ASSISTANT:  Lucita Lora. Shuford, PA-C  ANESTHESIA:  General endotracheal as well as an interscalene block.  ESTIMATED BLOOD LOSS:  250 mL.  DRAINS:  None.  IMPLANTS:  Press-fit size 14 DePuy global stem, a 44 x 18 eccentric humeral head and a cemented pegged 44 glenoid.  SPECIMENS:  None.  DRAINS:  None.  BRIEF HISTORY:  Ms. Pipkins is a 72 year old female who has had chronic, progressive and increasing left shoulder pain related to glenohumeral arthrosis.  Due to increasing functional limitations and pain, she is brought to the operating room at this time for planned left total shoulder arthroplasty.  I preoperatively counseled Ms. Stephen regarding treatment options as well as risks versus benefits thereof.  Possible surgical complications were reviewed including potential for bleeding, infection, neurovascular injury, persistent pain, loss of motion, failure of the implant, anesthetic complication, possible need for additional surgery.  She understands and accepts and agrees with our planned procedure.  PROCEDURE IN DETAIL:  After undergoing routine preop evaluation, the patient received prophylactic antibiotics and an interscalene block was established in the holding area by the Anesthesia Department.  She was placed supine on the operating table, underwent smooth induction of a general endotracheal anesthesia.  Placed in beach-chair position  and appropriately padded and protected.  The left shoulder girdle region was then sterilely prepped and draped in standard fashion.  Time-out was called.  An anterior approach to the left shoulder was made through a 15 cm deltopectoral incision.  Skin flaps were elevated.  Electrocautery was used for hemostasis.  The deltopectoral interval was identified and cephalic vein was retracted laterally with the deltoid, and the deltoid interval was then developed with a combination of blunt and sharp dissection and retractors placed.  The adhesions beneath the subdeltoid subacromial bursal region were all divided and mobilized.  The conjoined tendon was identified, mobilized and retracted medially.  The bicipital groove and biceps tendons were then unroofed and tenotomized for later tenodesis.  We then divided along the rotator interval to the base of the coracoid and then separated the subscapularis from its lesser tuberosity insertion using electrocautery and then tagged the free margin with a series of grasping #2 FiberWire sutures for later repair. We then divided inferiorly separating the capsule from the anteroinferior aspect of the humeral head allowing delivery of the humeral head through the wound careful to protect the rotator cuff superiorly and posteriorly.  The humeral head was noted to be markedly deformed and had an eroded area at the central aspect of the head with bony collapse.  Extramedullary guide was then used to outline our proposed humeral head resection.  This was then completed utilizing an oscillating saw.  We then used hand reamings of the humeral canal up to size 14.  We then determined proper degree of retroversion approximately 30 degrees and broaches up to size 14.  Once this was completed, we placed a metal cap over the cut surface of the proximal humerus and then proceeded to expose the glenoid with combination of pitchfork protruded snake tongue retractors.  We  performed a circumferential capsular release and labral resection also removing the proximal stump of the biceps.  We circumferentially mobilized the subscapularis.  After gaining circumferential exposure of the glenoid, we then placed a central guide pin and after determining that the 44 glenoid had the appropriate size.  We then reamed with a 44 to a subchondral bony base. Central drill hole and peripheral peg holes were placed using standard technique.  The glenoid was irrigated.  Cement was mixed in the appropriate consistency.  We introduced cement into the peripheral peg holes and the final 44 pegged glenoid was impacted into position with excellent fit and fixation.  After cement hardened, we then returned our attention to the humerus where we implanted the size 14 stem using bone graft from the resected head to bone graft the metaphyseal portion of the stem and final impaction was then completed with excellent fit and fixation.  We then performed a series of trial reductions and ultimately the 44 x 18 eccentric head showed the best soft tissue balance with good coverage of the proximal humerus.  I should mention we did remove the anterior and inferior osteophytes from the humeral head, and achieved good soft tissue balance into the implant much to our satisfaction.  We again cleaned the [facility] taper and impacted the final 44 x 18 eccentric head.  Final reduction was performed.  The subscapularis was then repaired back to the lesser tuberosity using #2 FiberWires.  We then closed the rotator interval with a figure-of-eight #2 FiberWire.  The overall soft tissue balance was much to our satisfaction with excellent mobility and good soft tissue stability.  At this point, we then performed a biceps tenodesis using #2 FiberWires just above the pectoralis insertion.  The wound was then irrigated and hemostasis was obtained.  The deltopectoral interval was reapproximated with 0  Vicryl sutures, 2-0 Vicryl used for the subcu layer and intracuticular 3-0 Monocryl for the skin followed by Steri-Strips.  Dry dressings were then applied.  The left arm was placed in a sling.  The patient was awakened, extubated, and taken to recovery room in stable condition.     Vania Rea. Jayln Madeira, M.D.     KMS/MEDQ  D:  03/12/2012  T:  03/12/2012  Job:  [id number]

## 2012-03-12 NOTE — Progress Notes (Signed)
Utilization Review Completed.   Gennette Shadix, RN, BSN Nurse Case Manager  [phone]  

## 2012-03-12 NOTE — H&P (Signed)
[name]    Chief Complaint: Osteoarthritis of the Left Shoulder HPI: The patient is a 72 y.o. female with end stage left shoulder OA  Past Medical History  Diagnosis Date  . Asthma   . Depression   . Vitamin B 12 deficiency   . Anemia   . Thyroid disease   . Hypothyroidism   . Sleep difficulties     had sleep study -2009, prior to gastric surgery, told that there was not a need for f/u  . Arthritis     osteoarthritis - shoulder, knees & hips  . Hypertension     Dr. Terrilee [name] manages BP, pt. reports MD has not found a need for treatment   . Diabetes mellitus without complication     no meds    Past Surgical History  Procedure Laterality Date  . Abdominal hysterectomy    . Oophorectomy    . Bariatric surgery    . Pantallor arthrodesis with rod placement left foot    . Cardiac catheterization      [facility]- 30 yrs. ago  . Tonsillectomy      Family History  Problem Relation Age of Onset  . Stroke Mother   . Liver cancer Father   . Cancer Father     liver    Social History:  reports that she quit smoking about 34 years ago. She does not have any smokeless tobacco history on file. She reports that she drinks about 1.2 ounces of alcohol per week. She reports that she does not use illicit drugs.  Allergies:  Allergies  Allergen Reactions  . Ace Inhibitors   . Azithromycin     REACTION: Rash  . Codeine     REACTION: Upset stomach    Medications Prior to Admission  Medication Sig Dispense Refill  . ALPRAZolam (XANAX) 0.5 MG tablet Take 1 tablet (0.5 mg total) by mouth 3 (three) times daily as needed.  30 tablet  6  . APAP-Salicyl-Phenyltolox-Caff (CAFGESIC) 325-250-20-50 MG CAPS Take 1 tablet by mouth every 6 (six) hours as needed. For headache      . Cholecalciferol (VITAMIN D) 1000 UNITS capsule Take 1,000 Units by mouth 2 (two) times daily.       . cyanocobalamin (,VITAMIN B-12,) 1000 MCG/ML injection Inject 1,000 mcg into the muscle every 30 (thirty) days.  Uses on amonthly basis      . cyclobenzaprine (FLEXERIL) 10 MG tablet Take 10 mg by mouth 3 (three) times daily as needed. For stiffness, muscle relaxer      . desvenlafaxine (PRISTIQ) 100 MG 24 hr tablet Take 100 mg by mouth daily after breakfast.      . diclofenac (VOLTAREN) 75 MG EC tablet Take 1 tablet (75 mg total) by mouth 2 (two) times daily. ALWAYS ORDER [employer]  180 tablet  2  . FeFum-FePo-FA-B Cmp-C-Zn-Mn-Cu (TANDEM PLUS) 162-115.2-1 MG CAPS Take 1 capsule by mouth 2 (two) times daily.      . fluticasone (FLONASE) 50 MCG/ACT nasal spray Place 2 sprays into the nose daily as needed. For allergies, nasal congestion      . Levothyroxine Sodium (TIROSINT) 100 MCG CAPS Take 200 mcg by mouth daily before breakfast.       . montelukast (SINGULAIR) 10 MG tablet Take 1 tablet (10 mg total) by mouth at bedtime.  30 tablet  3  . Omega-3 Fatty Acids (FISH OIL) 1000 MG CAPS Take 1,000 mg by mouth 2 (two) times daily.  Physical Exam:left shoulder with painful and limited ROM as noted at recent office visit  Vitals  Temp:  [97.7 F (36.5 C)] 97.7 F (36.5 C) (02/20 0604) Pulse Rate:  [73] 73 (02/20 0604) Resp:  [20] 20 (02/20 0604) BP: (153)/(97) 153/97 mmHg (02/20 0604) SpO2:  [95 %] 95 % (02/20 0604)  Assessment/Plan  Impression: Osteoarthritis of the Left Shoulder  Plan of Action: Procedure(s): TOTAL SHOULDER ARTHROPLASTY  [name] M 03/12/2012, 7:21 AM

## 2012-03-12 NOTE — Anesthesia Postprocedure Evaluation (Signed)
  Anesthesia Post-op Note  Patient: [name]  Procedure(s) Performed: Procedure(s): TOTAL SHOULDER ARTHROPLASTY (Left)  Patient Location: PACU  Anesthesia Type:GA combined with regional for post-op pain  Level of Consciousness: awake, alert , oriented and patient cooperative  Airway and Oxygen Therapy: Patient Spontanous Breathing  Post-op Pain: mild  Post-op Assessment: Post-op Vital signs reviewed, Patient's Cardiovascular Status Stable, Respiratory Function Stable, Patent Airway, No signs of Nausea or vomiting and Pain level controlled  Post-op Vital Signs: stable  Complications: No apparent anesthesia complications

## 2012-03-12 NOTE — Progress Notes (Signed)
SW will await OT note to determine disposition post discharge. Sabino Niemann, [phone]

## 2012-03-12 NOTE — Op Note (Signed)
[name], [name] NO.:  [id number]  MEDICAL RECORD NO.:  [id number]  LOCATION:  5N24C                        FACILITY:  [facility]  PHYSICIAN:  Vania Rea. Sheyann Sulton, M.D.  DATE OF BIRTH:  11/16/1940  DATE OF PROCEDURE:  03/12/2012 DATE OF DISCHARGE:                              OPERATIVE REPORT   ADDENDUM:  French Ana A. Shuford, P.A.-C. was used as an Geophysicist/field seismologist throughout this case essential for help with positioning the extremity, management of the retractors, soft tissue manipulation, implant manipulation, wound closure, and intraoperative decision making.     Vania Rea. Raniah Karan, M.D.     KMS/MEDQ  D:  03/12/2012  T:  03/12/2012  Job:  [id number]

## 2012-03-12 NOTE — Progress Notes (Signed)
Pt c/o of pain in right upper chest area, worse when she takes a deep breath/sharp, does not radiate. VSS. Dr Katrinka Blazing notified. 12 lead ordered. Will cont to monitor closely.

## 2012-03-13 ENCOUNTER — Encounter (HOSPITAL_COMMUNITY): Payer: Self-pay | Admitting: Orthopedic Surgery

## 2012-03-13 MED ORDER — CYCLOBENZAPRINE HCL 10 MG PO TABS: 10.0000 mg | ORAL_TABLET | Freq: Three times a day (TID) | ORAL | Status: DC | PRN

## 2012-03-13 MED ORDER — OXYCODONE-ACETAMINOPHEN 5-325 MG PO TABS: 1.0000 | ORAL_TABLET | ORAL | Status: DC | PRN

## 2012-03-13 NOTE — Evaluation (Signed)
Occupational Therapy Evaluation Patient Details Name: [name] MRN: [id number] DOB: 1940-04-24 Today's Date: 03/13/2012 Time: 4098-1191 OT Time Calculation (min): 28 min  OT Assessment / Plan / Recommendation Clinical Impression  This 72 yo female s/p LTSA presents to acute OT with problems below. WIll benefit from acute OT with follow up OT at SNF.    OT Assessment  Patient needs continued OT Services    Follow Up Recommendations  SNF    Barriers to Discharge Decreased caregiver support    Equipment Recommendations  None recommended by OT       Frequency  Min 3X/week    Precautions / Restrictions Precautions Precautions: Fall Precaution Comments: has had left foot/ankle reconstructed Required Braces or Orthoses: Other Brace/Splint Other Brace/Splint: sling Restrictions Weight Bearing Restrictions: Yes LUE Weight Bearing: Non weight bearing   Pertinent Vitals/Pain 2/10 left shoulder--it feels like the nerve block is still working    ADL  Eating/Feeding: Simulated;Set up;Supervision/safety Where Assessed - Eating/Feeding: Chair Grooming: Simulated;Min guard Where Assessed - Grooming: Unsupported standing Upper Body Bathing: Simulated;Moderate assistance Where Assessed - Upper Body Bathing: Unsupported sit to stand Lower Body Bathing: Simulated;Maximal assistance Where Assessed - Lower Body Bathing: Unsupported sit to stand Upper Body Dressing: Simulated;+1 Total assistance Where Assessed - Upper Body Dressing: Unsupported sit to stand Lower Body Dressing: Simulated;+1 Total assistance Where Assessed - Lower Body Dressing: Unsupported sit to stand Toilet Transfer: Simulated;Minimal assistance Toilet Transfer Method: Sit to stand Toilet Transfer Equipment:  (Recliner to bed and back to recliner) Toileting - Clothing Manipulation and Hygiene: Simulated;Moderate assistance Where Assessed - Toileting Clothing Manipulation and Hygiene: Sit to stand from 3-in-1  or toilet Equipment Used:  (sling) Transfers/Ambulation Related to ADLs: Min guard A sit to stand and stand to sit, Min A for ambulation    OT Diagnosis: Acute pain;Generalized weakness  OT Problem List: Decreased range of motion;Impaired balance (sitting and/or standing);Decreased knowledge of use of DME or AE;Decreased knowledge of precautions;Impaired UE functional use;Pain OT Treatment Interventions: Self-care/ADL training;Therapeutic exercise;DME and/or AE instruction;Patient/family education;Balance training   OT Goals Acute Rehab OT Goals OT Goal Formulation: With patient Time For Goal Achievement: 03/20/12 Potential to Achieve Goals: Good ADL Goals Pt Will Transfer to Toilet: with supervision;Comfort height toilet;Grab bars;Maintaining weight bearing status ADL Goal: Toilet Transfer - Progress: Goal set today Pt Will Perform Toileting - Clothing Manipulation: with min assist;Standing ADL Goal: Toileting - Clothing Manipulation - Progress: Goal set today Pt Will Perform Toileting - Hygiene: with min assist;Standing at 3-in-1/toilet Arm Goals Additional Arm Goal #1: Pt will be min gurad A for pendulum exercises Arm Goal: Additional Goal #1 - Progress: Goal set today Miscellaneous OT Goals Miscellaneous OT Goal #1: Pt will demonstrate how she is suppose to position herself to wash under her left arm OT Goal: Miscellaneous Goal #1 - Progress: Goal set today Miscellaneous OT Goal #2: Pt will be independent with left elbow exercises. OT Goal: Miscellaneous Goal #2 - Progress: Goal set today Miscellaneous OT Goal #3: Pt will be able to direct someone how to don her sling. OT Goal: Miscellaneous Goal #3 - Progress: Goal set today  Visit Information  Last OT Received On: 03/13/12 Assistance Needed: +1    Subjective Data  Subjective: I live by myself Patient Stated Goal: Go to [facility] rehab   Prior Functioning     Home Living Lives With: Alone Type of Home:  House Home Access: Stairs to enter [employer] of Steps: 4 Entrance Stairs-Rails: Right  Home Layout: One level Home Adaptive Equipment: Quad cane Prior Function Level of Independence: Independent Able to Take Stairs?: Yes Driving: Yes Vocation: Retired Musician: No difficulties Dominant Hand: Right         Vision/Perception Vision - History Baseline Vision: No visual deficits   Cognition  Cognition Overall Cognitive Status: Appears within functional limits for tasks assessed/performed Arousal/Alertness: Awake/alert Orientation Level: Appears intact for tasks assessed Behavior During Session: [facility] for tasks performed    Extremity/Trunk Assessment Right Upper Extremity Assessment RUE ROM/Strength/Tone: Within functional levels Left Upper Extremity Assessment LUE ROM/Strength/Tone: Deficits LUE ROM/Strength/Tone Deficits: At shoulder due to sx     Mobility Bed Mobility Details for Bed Mobility Assistance: Pt up in recliner upon my arrival Transfers Transfers: Sit to Stand;Stand to Sit Sit to Stand: 4: Min guard;With armrests;With upper extremity assist;From chair/3-in-1 Stand to Sit: 4: Min guard;With upper extremity assist;With armrests;To chair/3-in-1 Details for Transfer Assistance: VCs to only use RUE to push up not LUE     Exercise Other Exercises Other Exercises: Had pt do 10 reps of each of the pendulum exercises and elbow flexion/extension Donning/doffing shirt without moving shoulder:  (total A) Method for sponge bathing under operated UE: Maximal assistance Donning/doffing sling/immobilizer:  (total A) Correct positioning of sling/immobilizer:  (total A) Pendulum exercises (written home exercise program): Moderate assistance ROM for elbow, wrist and digits of operated UE: Min-guard Sling wearing schedule (on at all times/off for ADL's): Independent      End of Session OT - End of Session Equipment Utilized During Treatment:   (sling) Activity Tolerance: Patient tolerated treatment well Patient left: in chair;with call bell/phone within reach Nurse Communication:  (Nursing already had her up)       [name]  [phone]  03/13/2012, 11:32 AM

## 2012-03-13 NOTE — Clinical Social Work Placement (Signed)
Clinical Social Work Department  CLINICAL SOCIAL WORK PLACEMENT NOTE  03/12/2012  Patient: [name] Account Number: [id number]  Admit date: 03/11/12  Clinical Social Worker: Sabino Niemann MSW Date/time: 03/12/2012 1:30 PM  Clinical Social Work is seeking post-discharge placement for this patient at the following level of care: SKILLED NURSING (*CSW will update this form in Epic as items are completed)  2/20/2014Patient/family provided with [facility] Department of Clinical Social Work's list of facilities offering this level of care within the geographic area requested by the patient (or if unable, by the patient's family).  03/12/2012 Patient/family informed of their freedom to choose among providers that offer the needed level of care, that participate in Medicare, Medicaid or managed care program needed by the patient, have an available bed and are willing to accept the patient.  2/20/2014Patient/family informed of [facility]' ownership interest in [facility] as well as of the fact that they are under no obligation to receive care at this facility.  PASARR submitted to EDS on 03/12/2012  PASARR number received from EDS on 03/12/2012  FL2 transmitted to all facilities in geographic area requested by pt/family on 03/12/2012  FL2 transmitted to all facilities within larger geographic area on  Patient informed that his/her managed care company has contracts with or will negotiate with certain facilities, including the following:  Patient/family informed of bed offers received: 03/13/12  Patient chooses bed at Pre registered at [facility]  Physician recommends and patient chooses bed at [facility] Patient to be transferred to on 03/13/12 Patient to be transferred to facility by Private vehichle The following physician request were entered in Epic:  Additional Comments:  Sabino Niemann, MSW,  [phone]  Signing off

## 2012-03-13 NOTE — Progress Notes (Signed)
Occupational Therapy Treatment Patient Details Name: [name] MRN: [id number] DOB: 05/25/1940 Today's Date: 03/13/2012 Time: 7829-5621 OT Time Calculation (min): 19 min  OT Assessment / Plan / Recommendation Comments on Treatment Session This pt making tolerating exercises, will continue to benefit from OT at SNF    Follow Up Recommendations  SNF       Equipment Recommendations  None recommended by OT       Frequency Min 3X/week   Plan Discharge plan remains appropriate    Precautions / Restrictions Precautions Precautions: Fall;Shoulder Type of Shoulder Precautions: 30 external rotation, 60 abduction, 90 FF---all PROM Precaution Comments: has had left foot/ankle reconstructed Required Braces or Orthoses: Other Brace/Splint Other Brace/Splint: sling Restrictions Weight Bearing Restrictions: Yes LUE Weight Bearing: Non weight bearing   Pertinent Vitals/Pain 4/10 left shoulder    ADL  ADL Comments: I had to demonstrate to pt how do position herself to bath under her left arm and right arm--she was unable to recall how I had shown her this AM      OT Goals Acute Rehab OT Goals OT Goal Formulation: With patient Time For Goal Achievement: 03/20/12 Potential to Achieve Goals: Good ADL Goals Pt Will Transfer to Toilet: with supervision;Comfort height toilet;Grab bars;Maintaining weight bearing status ADL Goal: Toilet Transfer - Progress: Goal set today Pt Will Perform Toileting - Clothing Manipulation: with min assist;Standing ADL Goal: Toileting - Clothing Manipulation - Progress: Goal set today Pt Will Perform Toileting - Hygiene: with min assist;Standing at 3-in-1/toilet Arm Goals Additional Arm Goal #1: Pt will be min gurad A for pendulum exercises Arm Goal: Additional Goal #1 - Progress: Goal set today Miscellaneous OT Goals Miscellaneous OT Goal #1: Pt will demonstrate how she is suppose to position herself to wash under her left arm OT Goal:  Miscellaneous Goal #1 - Progress: Progressing toward goals Miscellaneous OT Goal #2: Pt will be independent with left elbow exercises. OT Goal: Miscellaneous Goal #2 - Progress: Progressing toward goals Miscellaneous OT Goal #3: Pt will be able to direct someone how to don her sling. OT Goal: Miscellaneous Goal #3 - Progress: Goal set today  Visit Information  Last OT Received On: 03/13/12 Assistance Needed: +1    Subjective Data  Subjective: It does hurt a little more this afternoon      Cognition  Cognition Overall Cognitive Status: Appears within functional limits for tasks assessed/performed Arousal/Alertness: Awake/alert Orientation Level: Appears intact for tasks assessed Behavior During Session: [facility] for tasks performed    Mobility  Transfers Transfers: Sit to Stand;Stand to Sit Sit to Stand: 4: Min guard;With upper extremity assist;From chair/3-in-1 Stand to Sit: 4: Min guard;With upper extremity assist;To bed Details for Transfer Assistance: VCs to only use RUE to push up not LUE    Exercises  Other Exercises Other Exercises: Pt performed 10 reps of elbow flexion/extension with S, 10 reps of each of the 4 pendulums with Min A for technique/motion, 10 PROM for external rotation, abduction, FF   Balance     End of Session OT - End of Session Activity Tolerance: Patient tolerated treatment well Patient left: in chair;with family/visitor present       Evette Georges [phone] 03/13/2012, 3:27 PM

## 2012-03-13 NOTE — Discharge Summary (Signed)
PATIENT ID:      [name]  MRN:     [id number] DOB/AGE:    [birth year] / 72 y.o.     DISCHARGE SUMMARY  ADMISSION DATE:    03/12/2012 DISCHARGE DATE:  03/13/2012  ADMISSION DIAGNOSIS: Osteoarthritis of the Left Shoulder Past Medical History  Diagnosis Date  . Asthma   . Depression   . Vitamin B 12 deficiency   . Anemia   . Thyroid disease   . Hypothyroidism   . Sleep difficulties     had sleep study -2009, prior to gastric surgery, told that there was not a need for f/u  . Arthritis     osteoarthritis - shoulder, knees & hips  . Hypertension     Dr. Terrilee Files manages BP, pt. reports MD has not found a need for treatment   . Diabetes mellitus without complication     no meds    DISCHARGE DIAGNOSIS:   Active Problems:   * No active hospital problems. *   PROCEDURE: Procedure(s): TOTAL SHOULDER ARTHROPLASTY on 03/12/2012  CONSULTS:   none  HISTORY:  See H&P in chart.  HOSPITAL COURSE:  [name] is a 72 y.o. admitted on 03/12/2012 with a chief complaint of left shoulder pain, and found to have a diagnosis of Osteoarthritis of the Left Shoulder.  They were brought to the operating room on 03/12/2012 and underwent Procedure(s): TOTAL SHOULDER ARTHROPLASTY.    They were given perioperative antibiotics: Anti-infectives   Start     Dose/Rate Route Frequency Ordered Stop   03/12/12 1500  ceFAZolin (ANCEF) IVPB 1 g/50 mL premix     1 g 100 mL/hr over 30 Minutes Intravenous Every 6 hours 03/12/12 1425 03/13/12 0330   03/12/12 0600  ceFAZolin (ANCEF) 3 g in dextrose 5 % 50 mL IVPB  Status:  Discontinued     3 g 160 mL/hr over 30 Minutes Intravenous On call to O.R. 03/11/12 1345 03/12/12 1424    .  Patient underwent the above named procedure and tolerated it well. The following day they were hemodynamically stable and pain was controlled on oral analgesics. They were neurovascularly intact to the operative extremity. OT was ordered and worked with patient per  protocol. They were medically and orthopaedically the following am however the family and patient were concerned regarding possible discharge plans to a skilled facility as patient lives alone. The decision was made to explore all options after working with OT and make decisions based on her abilities  This will be amended once decisions are made.    A bed was available at [facility] and everyone was in agreement this was the best option. Pt was discharged there on todays date.     DIAGNOSTIC STUDIES:  RECENT RADIOGRAPHIC STUDIES :  Dg Chest 2 View  03/04/2012  *RADIOLOGY REPORT*  Clinical Data: Preoperative assessment for left total shoulder arthroplasty, history asthma, hypertension, diabetes, former smoker  CHEST - 2 VIEW  Comparison: 10/25/2009  Findings: Upper-normal size of cardiac silhouette. Calcified tortuous aorta. Pulmonary vascularity normal. Mild central peribronchial thickening without infiltrate, pleural effusion or pneumothorax. Degenerative changes left shoulder. Perigastric surgical clips by history prior bariatric surgery.  IMPRESSION: Mild bronchitic changes. No acute abnormalities.   Original Report Authenticated By: Ulyses Southward, M.D.     RECENT VITAL SIGNS:  Patient Vitals for the past 24 hrs:  BP Temp Temp src Pulse Resp SpO2 Height Weight  03/13/12 0500 155/99 mmHg 98.7 F (37.1 C) - 82 18  95 % - -  03/12/12 2233 155/86 mmHg 98.7 F (37.1 C) - 86 18 94 % - -  03/12/12 1615 153/84 mmHg 98 F (36.7 C) Oral 85 18 93 % 5\' 1"  (1.549 m) 107.049 kg (236 lb)  03/12/12 1450 150/80 mmHg 97.4 F (36.3 C) Oral 79 18 98 % - -  03/12/12 1400 - 97.7 F (36.5 C) - 82 16 97 % - -  03/12/12 1353 126/80 mmHg - - - - - - -  03/12/12 1315 - - - 77 14 100 % - -  03/12/12 1308 135/67 mmHg - - - - - - -  03/12/12 1300 - - - 69 14 100 % - -  03/12/12 1230 - - - 76 15 96 % - -  03/12/12 1225 132/61 mmHg - - - - - - -  03/12/12 1209 - - - 71 17 99 % - -  03/12/12 1145 - - - 64 12 99 %  - -  03/12/12 1138 123/85 mmHg - - - - - - -  03/12/12 1128 - - - 65 14 99 % - -  03/12/12 1123 142/72 mmHg - - - - - - -  03/12/12 1111 - - - 63 8 98 % - -  03/12/12 1108 143/81 mmHg - - - - - - -  03/12/12 1100 - - - 62 20 99 % - -  03/12/12 1053 151/76 mmHg - - - - - - -  03/12/12 1045 - - - 66 13 100 % - -  03/12/12 1038 148/95 mmHg - - - - - - -  03/12/12 1030 - - - 63 13 97 % - -  03/12/12 1023 131/77 mmHg - - - - - - -  03/12/12 1015 - - - 65 18 99 % - -  03/12/12 1008 149/73 mmHg - - - - - - -  03/12/12 1000 - 97.8 F (36.6 C) - - - - - -  .  RECENT EKG RESULTS:    Orders placed during the hospital encounter of 03/12/12  . EKG 12-LEAD  . EKG 12-LEAD    DISCHARGE INSTRUCTIONS:   Future Appointments Provider Department Dept Phone   05/29/2012 9:30 AM Stacie Glaze, MD [facility] [place] [phone]      DISCHARGE MEDICATIONS:     Medication List    TAKE these medications       ALPRAZolam 0.5 MG tablet  Commonly known as:  XANAX  Take 1 tablet (0.5 mg total) by mouth 3 (three) times daily as needed.     CAFGESIC 325-250-20-50 MG Caps  Generic drug:  APAP-Salicyl-Phenyltolox-Caff  Take 1 tablet by mouth every 6 (six) hours as needed. For headache     cyanocobalamin 1000 MCG/ML injection  Commonly known as:  (VITAMIN B-12)  Inject 1,000 mcg into the muscle every 30 (thirty) days. Uses on amonthly basis     cyclobenzaprine 10 MG tablet  Commonly known as:  FLEXERIL  Take 1 tablet (10 mg total) by mouth 3 (three) times daily as needed for muscle spasms.     cyclobenzaprine 10 MG tablet  Commonly known as:  FLEXERIL  Take 10 mg by mouth 3 (three) times daily as needed. For stiffness, muscle relaxer     desvenlafaxine 100 MG 24 hr tablet  Commonly known as:  PRISTIQ  Take 100 mg by mouth daily after breakfast.     diclofenac 75 MG EC tablet  Commonly known as:  VOLTAREN  Take 1 tablet (75 mg total) by mouth 2 (two) times daily. ALWAYS ORDER  [employer]     Fish Oil 1000 MG Caps  Take 1,000 mg by mouth 2 (two) times daily.     fluticasone 50 MCG/ACT nasal spray  Commonly known as:  FLONASE  Place 2 sprays into the nose daily as needed. For allergies, nasal congestion     montelukast 10 MG tablet  Commonly known as:  SINGULAIR  Take 1 tablet (10 mg total) by mouth at bedtime.     oxyCODONE-acetaminophen 5-325 MG per tablet  Commonly known as:  PERCOCET/ROXICET  Take 1-2 tablets by mouth every 4 (four) hours as needed.     Tandem Plus 162-115.2-1 MG Caps  Take 1 capsule by mouth 2 (two) times daily.     TIROSINT 100 MCG Caps  Generic drug:  Levothyroxine Sodium  Take 200 mcg by mouth daily before breakfast.     Vitamin D 1000 UNITS capsule  Take 1,000 Units by mouth 2 (two) times daily.        FOLLOW UP VISIT:  in 2 weeks  DISCHARGE TO: Skilled  DISPOSITION:   DISCHARGE CONDITION:  Good   Reedy Biernat for Dr. Francena Hanly 03/13/2012, 8:26 AM

## 2012-03-13 NOTE — Clinical Social Work Psychosocial (Signed)
Clinical Social Work Department  BRIEF PSYCHOSOCIAL ASSESSMENT  Patient: CADEE AGRO  Account Number: [id number] Admit date: 02/10/12 Clinical Social Worker Leinaala Catanese Riley Kill, MSW Date/Time:  Referred by: Physician Date Referred:02/10/12 Referred for   SNF Placement   Other Referral:  Interview type: Patient  Other interview type:PSYCHOSOCIAL DATA  Living Status: FAMILY- daughter Admitted from facility:  Level of care:  Primary support name: Kapec,Paula   Primary support relationship to patient: daughter Degree of support available:  Strong and vested  CURRENT CONCERNS  Current Concerns   Post-Acute Placement   Other Concerns:  SOCIAL WORK ASSESSMENT / PLAN  CSW met with pt re: PT recommendation for SNF.   Pt lives with her daughter  CSW explained placement process and answered questions.   Pt pre-registered at [facility]    CSW completed FL2 and and sent clinicals to [facility]    Assessment/plan status: Information/Referral to [facility]  Other assessment/ plan:  Information/referral to community resources:  SNF     PATIENT'S/FAMILY'S RESPONSE TO PLAN OF CARE:  Pt reports she is agreeable to [facility] SNF in order to increase strength and independence with mobility prior to return home  Pt verbalized understanding of placement process and appreciation for CSW assist.   Sabino Niemann, MSW [phone]

## 2012-03-20 ENCOUNTER — Telehealth: Payer: Self-pay | Admitting: Internal Medicine

## 2012-03-20 NOTE — Telephone Encounter (Signed)
BP 131/80 today. Therapy went out today and checked it and will be going out next week to check again.  Please advise about the xanax

## 2012-03-20 NOTE — Telephone Encounter (Signed)
Debbie from [place] is calling concerning pt bp yesterday was 162/100 and depression is bad. Pt is using a lot of pain med and requesting refill on xanax 0.5mg . Please call debbie RN

## 2012-03-23 NOTE — Telephone Encounter (Signed)
Talked with debbie at [place] and they will discuss with her why she needs the xanax-take hydrocodone bid-tid-they will call us back after Wednesday appointment

## 2012-03-23 NOTE — Telephone Encounter (Signed)
monitor the blood pressure The recheck was appropriate Blood pressure will increase acutely with pain Who is managing her pain?

## 2012-04-23 ENCOUNTER — Telehealth: Payer: Self-pay | Admitting: *Deleted

## 2012-04-23 NOTE — Telephone Encounter (Signed)
diclofenac doesn't work ,must have voltaren-which she states really doesn't help--would like to try cybalta--can she take with pritiq?

## 2012-04-24 NOTE — Telephone Encounter (Signed)
Pt informed

## 2012-04-24 NOTE — Telephone Encounter (Signed)
Per dr Lovell Sheehan- can not take together

## 2012-05-28 ENCOUNTER — Encounter: Payer: Self-pay | Admitting: Internal Medicine

## 2012-05-28 NOTE — Progress Notes (Signed)
  Subjective:    Patient ID: [name], female    DOB: September 26, 1940, 72 y.o.   MRN: [id number]  HPI Comments: Patient had interaction between Pristiq and flexeril with hallucinations ( serotonin reaction)  Diabetes She presents for her follow-up diabetic visit. She has type 2 diabetes mellitus. Her disease course has been fluctuating. There are no hypoglycemic associated symptoms. Associated symptoms include blurred vision. Pertinent negatives for diabetes include no chest pain, no fatigue and no weight loss. (Seeing the opthamologist for eye exam Night vision issue and blurring vision) There are no hypoglycemic complications. Symptoms are worsening. Pertinent negatives for diabetic complications include no autonomic neuropathy, heart disease or peripheral neuropathy. Risk factors for coronary artery disease include diabetes mellitus, hypertension and stress. Current diabetic treatment includes oral agent (monotherapy). She is compliant with treatment most of the time. Her weight is increasing steadily. She is following a diabetic diet. Meal planning includes avoidance of concentrated sweets and calorie counting. She has not had a previous visit with a dietician. She rarely participates in exercise. There is no change in her home blood glucose trend. An ACE inhibitor/angiotensin II receptor blocker is contraindicated. She does not see a podiatrist.Eye exam is current.  Hypertension This is a chronic problem. The current episode started more than 1 year ago. The problem is unchanged. The problem is controlled. Associated symptoms include blurred vision. Pertinent negatives include no chest pain. Agents associated with hypertension include decongestants. Risk factors for coronary artery disease include diabetes mellitus, family history, post-menopausal state and obesity. Past treatments include ACE inhibitors. The current treatment provides mild improvement. Compliance problems include exercise.   Hypertensive end-organ damage includes a thyroid problem.    Review of Systems  Constitutional: Negative for weight loss and fatigue.  Eyes: Positive for blurred vision.  Cardiovascular: Negative for chest pain and leg swelling.       Objective:   Physical Exam  Constitutional:  Obese white female NAD   HENT:  Head: Normocephalic and atraumatic.  Right Ear: External ear normal.  Left Ear: External ear normal.  Eyes: Conjunctivae are normal. Pupils are equal, round, and reactive to light.  Neck: Neck supple. No JVD present.  Cardiovascular: Normal rate.   Murmur heard. One over six  Pulmonary/Chest: Breath sounds normal. She has no wheezes.  Abdominal: She exhibits distension. There is no tenderness.  Lymphadenopathy:    She has no cervical adenopathy.  Skin: Skin is warm and dry.          Assessment & Plan:

## 2012-05-28 NOTE — Assessment & Plan Note (Addendum)
Today we will obtain a BMET.  Diet and weight loss are a key to better HTN control Stable blood pressure on current medication

## 2012-05-29 ENCOUNTER — Ambulatory Visit (INDEPENDENT_AMBULATORY_CARE_PROVIDER_SITE_OTHER): Payer: Medicare Other | Admitting: Internal Medicine

## 2012-05-29 VITALS — BP 136/80 | HR 76 | Temp 98.2°F | Resp 16 | Ht 61.0 in | Wt 235.0 lb

## 2012-05-29 DIAGNOSIS — E119 Type 2 diabetes mellitus without complications: Secondary | ICD-10-CM

## 2012-05-29 DIAGNOSIS — T887XXA Unspecified adverse effect of drug or medicament, initial encounter: Secondary | ICD-10-CM

## 2012-05-29 DIAGNOSIS — M1990 Unspecified osteoarthritis, unspecified site: Secondary | ICD-10-CM

## 2012-05-29 DIAGNOSIS — I10 Essential (primary) hypertension: Secondary | ICD-10-CM

## 2012-05-29 DIAGNOSIS — IMO0001 Reserved for inherently not codable concepts without codable children: Secondary | ICD-10-CM

## 2012-05-29 DIAGNOSIS — J45909 Unspecified asthma, uncomplicated: Secondary | ICD-10-CM

## 2012-05-29 LAB — LIPID PANEL
HDL: 41 mg/dL (ref >39.00)
Total CHOL/HDL Ratio: 5
VLDL: 33 mg/dL (ref 0.0–40.0)

## 2012-05-29 LAB — BASIC METABOLIC PANEL
BUN: 13 mg/dL (ref 6–23)
CO2: 27 mEq/L (ref 19–32)
Chloride: 102 mEq/L (ref 96–112)
GFR: 55.95 mL/min — ABNORMAL LOW (ref >60.00)
Glucose, Bld: 123 mg/dL — ABNORMAL HIGH (ref 70–99)
Potassium: 4.5 mEq/L (ref 3.5–5.1)
Sodium: 136 mEq/L (ref 135–145)

## 2012-05-29 MED ORDER — CELECOXIB 200 MG PO CAPS: 200.0000 mg | ORAL_CAPSULE | Freq: Two times a day (BID) | ORAL | Status: DC

## 2012-05-29 NOTE — Assessment & Plan Note (Signed)
On preventive protocol without inhaler Allergies controlled on allegra

## 2012-05-29 NOTE — Patient Instructions (Signed)
We have placed Flexeril in your allergy box to flag any prescriber  that there is an interaction between muscle relaxants and your pristiq

## 2012-05-29 NOTE — Assessment & Plan Note (Signed)
This is a redo hemoglobin A1c for monitoring of his for diabetes a basic metabolic panel and a lipid profile

## 2012-06-16 ENCOUNTER — Other Ambulatory Visit: Payer: Self-pay | Admitting: Internal Medicine

## 2012-07-28 ENCOUNTER — Telehealth: Payer: Self-pay | Admitting: Internal Medicine

## 2012-07-28 MED ORDER — BETAMETHASONE DIPROPIONATE 0.05 % EX CREA: TOPICAL_CREAM | Freq: Two times a day (BID) | CUTANEOUS | Status: DC

## 2012-07-28 NOTE — Telephone Encounter (Signed)
Pt would like a refill of betamethasone dipropionate (DIPROLENE) 0.05 % cream. Pt is having itching from allergies.. Pharm: [facility] [address]

## 2012-07-28 NOTE — Telephone Encounter (Signed)
done

## 2012-08-14 ENCOUNTER — Encounter: Payer: Self-pay | Admitting: Family Medicine

## 2012-08-14 ENCOUNTER — Ambulatory Visit (INDEPENDENT_AMBULATORY_CARE_PROVIDER_SITE_OTHER): Payer: Medicare Other | Admitting: Family Medicine

## 2012-08-14 VITALS — BP 140/84 | Temp 98.4°F | Wt 228.0 lb

## 2012-08-14 DIAGNOSIS — R300 Dysuria: Secondary | ICD-10-CM

## 2012-08-14 LAB — POCT URINALYSIS DIPSTICK
Ketones, UA: NEGATIVE
Urobilinogen, UA: 0.2
pH, UA: 7

## 2012-08-14 MED ORDER — CIPROFLOXACIN HCL 500 MG PO TABS: 500.0000 mg | ORAL_TABLET | Freq: Two times a day (BID) | ORAL | Status: DC

## 2012-08-14 NOTE — Progress Notes (Signed)
Chief Complaint  Patient presents with  . Dysuria    bodyahces and chills     HPI:  Acute visit for Dysuria: -started: yesterday -symptoms: dysuria, frequency, urgency, body aches and chills - better today -denies: vomiting, nausea, fevers, flank pain, hematuria -hx of: UTI once a year or less  ROS: See pertinent positives and negatives per HPI.  Past Medical History  Diagnosis Date  . Asthma   . Depression   . Vitamin B 12 deficiency   . Anemia   . Thyroid disease   . Hypothyroidism   . Sleep difficulties     had sleep study -2009, prior to gastric surgery, told that there was not a need for f/u  . Arthritis     osteoarthritis - shoulder, knees & hips  . Hypertension     Dr. Terrilee Files manages BP, pt. reports MD has not found a need for treatment   . Diabetes mellitus without complication     no meds    Family History  Problem Relation Age of Onset  . Stroke Mother   . Liver cancer Father   . Cancer Father     liver    History   Social History  . Marital Status: Widowed    Spouse Name: N/A    Number of Children: N/A  . Years of Education: N/A   Occupational History  . retired Engineer, civil (consulting)    Social History Main Topics  . Smoking status: Former Smoker    Quit date: 03/04/1978  . Smokeless tobacco: None  . Alcohol Use: 1.2 oz/week    2 Glasses of wine per week  . Drug Use: No  . Sexually Active: Not Currently   Other Topics Concern  . None   Social History Narrative  . None    Current outpatient prescriptions:ALPRAZolam (XANAX) 0.5 MG tablet, Take 1 tablet (0.5 mg total) by mouth 3 (three) times daily as needed., Disp: 30 tablet, Rfl: 6;  APAP-Salicyl-Phenyltolox-Caff (CAFGESIC) 325-250-20-50 MG CAPS, Take 1 tablet by mouth every 6 (six) hours as needed. For headache, Disp: , Rfl: ;  betamethasone dipropionate (DIPROLENE) 0.05 % cream, Apply topically 2 (two) times daily., Disp: 30 g, Rfl: 11 celecoxib (CELEBREX) 200 MG capsule, Take 1 capsule (200 mg  total) by mouth 2 (two) times daily., Disp: 180 capsule, Rfl: 3;  Cholecalciferol (VITAMIN D) 1000 UNITS capsule, Take 1,000 Units by mouth 2 (two) times daily. , Disp: , Rfl: ;  cyanocobalamin (,VITAMIN B-12,) 1000 MCG/ML injection, Inject 1,000 mcg into the muscle every 30 (thirty) days. Uses on amonthly basis, Disp: , Rfl:  desvenlafaxine (PRISTIQ) 100 MG 24 hr tablet, Take 100 mg by mouth daily after breakfast., Disp: , Rfl: ;  FeFum-FePo-FA-B Cmp-C-Zn-Mn-Cu (TANDEM PLUS) 162-115.2-1 MG CAPS, TAKE ONE CAPSULE BY MOUTH TWICE DAILY, Disp: 180 each, Rfl: 0;  fluticasone (FLONASE) 50 MCG/ACT nasal spray, Place 2 sprays into the nose daily as needed. For allergies, nasal congestion, Disp: , Rfl:  Levothyroxine Sodium (TIROSINT) 100 MCG CAPS, Take 200 mcg by mouth daily before breakfast. , Disp: , Rfl: ;  Omega-3 Fatty Acids (FISH OIL) 1000 MG CAPS, Take 1,000 mg by mouth 2 (two) times daily. , Disp: , Rfl: ;  oxyCODONE-acetaminophen (PERCOCET/ROXICET) 5-325 MG per tablet, Take 1-2 tablets by mouth every 4 (four) hours as needed., Disp: 60 tablet, Rfl: 0  EXAM:  Filed Vitals:   08/14/12 0917  BP: 140/84  Temp: 98.4 F (36.9 C)    Body mass index is 43.1  kg/(m^2).  GENERAL: vitals reviewed and listed above, alert, oriented, appears well hydrated and in no acute distress  HEENT: atraumatic, conjunttiva clear, no obvious abnormalities on inspection of external nose and ears  NECK: no obvious masses on inspection  LUNGS: clear to auscultation bilaterally, no wheezes, rales or rhonchi, good air movement  CV: HRRR, no peripheral edema  ABD: soft, NTTP, no CVA TTP  MS: moves all extremities without noticeable abnormality  PSYCH: pleasant and cooperative, no obvious depression or anxiety  ASSESSMENT AND PLAN:  Discussed the following assessment and plan:  Dysuria - Plan: POCT urinalysis dipstick, Culture, Urine  -udip c/w likely UTI; empiric abx and risks discussed and sent to pharmacy to  start today -culture pending -Patient advised to return or notify a doctor immediately if symptoms worsen or persist or new concerns arise.  There are no Patient Instructions on file for this visit.   Kriste Basque R.

## 2012-08-17 NOTE — Progress Notes (Signed)
Quick Note:  Called and spoke with pt and pt is aware. ______ 

## 2012-08-28 ENCOUNTER — Ambulatory Visit: Payer: Medicare Other

## 2012-08-28 ENCOUNTER — Ambulatory Visit (INDEPENDENT_AMBULATORY_CARE_PROVIDER_SITE_OTHER): Payer: Medicare Other | Admitting: Family Medicine

## 2012-08-28 VITALS — BP 152/90 | HR 72 | Temp 97.8°F | Resp 17 | Ht 62.5 in | Wt 231.0 lb

## 2012-08-28 DIAGNOSIS — M25569 Pain in unspecified knee: Secondary | ICD-10-CM

## 2012-08-28 DIAGNOSIS — M25571 Pain in right ankle and joints of right foot: Secondary | ICD-10-CM

## 2012-08-28 DIAGNOSIS — M25539 Pain in unspecified wrist: Secondary | ICD-10-CM

## 2012-08-28 DIAGNOSIS — M25579 Pain in unspecified ankle and joints of unspecified foot: Secondary | ICD-10-CM

## 2012-08-28 DIAGNOSIS — M25561 Pain in right knee: Secondary | ICD-10-CM

## 2012-08-28 DIAGNOSIS — T148XXA Other injury of unspecified body region, initial encounter: Secondary | ICD-10-CM

## 2012-08-28 DIAGNOSIS — M25532 Pain in left wrist: Secondary | ICD-10-CM

## 2012-08-28 NOTE — Progress Notes (Signed)
[facility]:  Office Visit  Chief Complaint:  Chief Complaint  Patient presents with  . Foot Injury    right, fell walking down stairs 2 days ago     HPI: [name] is a 72 y.o. female who complains of  mutiple joint pain in her right foot, ankle and leg x 2 days. She fell going down the stairs from her kitchen to her laundry room.  She fell on the 2nd step, she twisted her whole body but was able to grab onto the hand rail and pole at the bottom of the stairwell. She twisted her ankle She also may have twisted her arm.  She has Right lateral leg pain and also right foot pain on the outer edges. She has swelling, bruising, worse since yesterday.  She has been walking on it for 2 days.  Took tylenol and RICE with some relief.  She used to have diabetes prior to gastric bypass, denies osteoporosis Denies weakness, numbness or tingling She has had foot work done by Dr. Victorino Dike on her left foot, she tried calling but they could not see her per the patient since not the foot he operated on  Past Medical History  Diagnosis Date  . Asthma   . Depression   . Vitamin B 12 deficiency   . Anemia   . Thyroid disease   . Hypothyroidism   . Sleep difficulties     had sleep study -2009, prior to gastric surgery, told that there was not a need for f/u  . Arthritis     osteoarthritis - shoulder, knees & hips  . Hypertension     Dr. Terrilee Files manages BP, pt. reports MD has not found a need for treatment   . Diabetes mellitus without complication     no meds   Past Surgical History  Procedure Laterality Date  . Abdominal hysterectomy    . Oophorectomy    . Bariatric surgery    . Pantallor arthrodesis with rod placement left foot    . Cardiac catheterization      [facility]- 30 yrs. ago  . Tonsillectomy    . Total shoulder arthroplasty Left 03/12/2012    Procedure: TOTAL SHOULDER ARTHROPLASTY;  Surgeon: Senaida Lange, MD;  Location: [facility] OR;  Service: Orthopedics;   Laterality: Left;   History   Social History  . Marital Status: Widowed    Spouse Name: N/A    Number of Children: N/A  . Years of Education: N/A   Occupational History  . retired Engineer, civil (consulting)    Social History Main Topics  . Smoking status: Former Smoker    Quit date: 03/04/1978  . Smokeless tobacco: None  . Alcohol Use: 1.2 oz/week    2 Glasses of wine per week  . Drug Use: No  . Sexually Active: Not Currently   Other Topics Concern  . None   Social History Narrative  . None   Family History  Problem Relation Age of Onset  . Stroke Mother   . Liver cancer Father   . Cancer Father     liver   Allergies  Allergen Reactions  . Flexeril (Cyclobenzaprine)     On Pristiq  With possible serotonin reaction to flexeril  . Ace Inhibitors   . Azithromycin     REACTION: Rash  . Codeine     REACTION: Upset stomach   Prior to Admission medications   Medication Sig Start Date End Date Taking? Authorizing Provider  ALPRAZolam (XANAX) 0.5 MG tablet Take 1 tablet (0.5 mg total) by mouth 3 (three) times daily as needed. 07/06/10  Yes Stacie Glaze, MD  APAP-Salicyl-Phenyltolox-Caff (CAFGESIC) 325-250-20-50 MG CAPS Take 1 tablet by mouth every 6 (six) hours as needed. For headache   Yes Historical Provider, MD  betamethasone dipropionate (DIPROLENE) 0.05 % cream Apply topically 2 (two) times daily. 07/28/12  Yes Stacie Glaze, MD  Cholecalciferol (VITAMIN D) 1000 UNITS capsule Take 1,000 Units by mouth 2 (two) times daily.    Yes Historical Provider, MD  cyanocobalamin (,VITAMIN B-12,) 1000 MCG/ML injection Inject 1,000 mcg into the muscle every 30 (thirty) days. Uses on amonthly basis 01/31/12  Yes Stacie Glaze, MD  desvenlafaxine (PRISTIQ) 100 MG 24 hr tablet Take 100 mg by mouth daily after breakfast. 08/19/11 08/28/12 Yes Stacie Glaze, MD  FeFum-FePo-FA-B Cmp-C-Zn-Mn-Cu (TANDEM PLUS) 162-115.2-1 MG CAPS TAKE ONE CAPSULE BY MOUTH TWICE DAILY 06/16/12  Yes Stacie Glaze, MD  fluticasone  [facility]) 50 MCG/ACT nasal spray Place 2 sprays into the nose daily as needed. For allergies, nasal congestion   Yes Historical Provider, MD  Levothyroxine Sodium (TIROSINT) 100 MCG CAPS Take 200 mcg by mouth daily before breakfast.    Yes Historical Provider, MD  Omega-3 Fatty Acids (FISH OIL) 1000 MG CAPS Take 1,000 mg by mouth 2 (two) times daily.    Yes Historical Provider, MD  celecoxib (CELEBREX) 200 MG capsule Take 1 capsule (200 mg total) by mouth 2 (two) times daily. 05/29/12   Stacie Glaze, MD  ciprofloxacin (CIPRO) 500 MG tablet Take 1 tablet (500 mg total) by mouth 2 (two) times daily. 08/14/12   Terressa Koyanagi, DO  oxyCODONE-acetaminophen (PERCOCET/ROXICET) 5-325 MG per tablet Take 1-2 tablets by mouth every 4 (four) hours as needed. 03/13/12   French Ana Shuford, PA-C     ROS: The patient denies fevers, chills, night sweats, unintentional weight loss, chest pain, palpitations, wheezing, dyspnea on exertion, nausea, vomiting, abdominal pain, dysuria, hematuria, melena, numbness, weakness, or tingling.   All other systems have been reviewed and were otherwise negative with the exception of those mentioned in the HPI and as above.    PHYSICAL EXAM: Filed Vitals:   08/28/12 1407  BP: 152/90  Pulse: 72  Temp: 97.8 F (36.6 C)  Resp: 17   Filed Vitals:   08/28/12 1407  Height: 5' 2.5" (1.588 m)  Weight: 231 lb (104.781 kg)   Body mass index is 41.55 kg/(m^2).  General: Alert, no acute distress HEENT:  Normocephalic, atraumatic, oropharynx patent.  Cardiovascular:  Regular rate and rhythm, no rubs murmurs or gallops.  No Carotid bruits, radial pulse intact. No pedal edema.  Respiratory: Clear to auscultation bilaterally.  No wheezes, rales, or rhonchi.  No cyanosis, no use of accessory musculature GI: No organomegaly, abdomen is soft and non-tender, positive bowel sounds.  No masses. Skin: No rashes. Neurologic: Facial musculature symmetric. Psychiatric: Patient is appropriate  throughout our interaction. Lymphatic: No cervical lymphadenopathy Musculoskeletal: Gait intact. Right foot- + swelling, bruising, decrease ROM due to pain + DP, good cap refill 5/5 strength, no appreciable foot drop Right ankle-nontender, dull ROM, + swelling, bruising at lateral malleolus,  Right tib/fib-Tender at lateral fibula, otherwise nl    LABS: Results for orders placed in visit on 08/14/12  URINE CULTURE      Result Value Range   Culture ESCHERICHIA COLI     Colony Count 75,000 COLONIES/ML     Organism ID, Bacteria ESCHERICHIA COLI  POCT URINALYSIS DIPSTICK      Result Value Range   Color, UA yellow     Clarity, UA cloudy     Glucose, UA n     Bilirubin, UA n     Ketones, UA n     Spec Grav, UA 1.020     Blood, UA 3+     pH, UA 7.0     Protein, UA 2+     Urobilinogen, UA 0.2     Nitrite, UA n     Leukocytes, UA large (3+)       EKG/XRAY:   Primary read interpreted by Dr. Conley [facility]s at [facility]. + djd of ankle, ankle mortis intact,no obvious fracture/dislocation No fracture of tib/fib + foot djd, please comment on 5th MTP,  fracture vs shadow.    ASSESSMENT/PLAN: Encounter Diagnoses  Name Primary?  . Pain in joint, ankle and foot, right Yes  . Pain in joint, lower leg, right   . Wrist pain, left    Possible 5th metatarsal fracture vs shadow Patient will continue to wear her shoe which is a flat soled shoe with velcro similar to post op, she has a cane at home she can use to alleviate the weight Nonweighbearing for now as much as possible until get official xray results Declined pain meds. Tylenol has been good pain controller RICE F/u prn, I will call her with official radiology report  Khaliah Barnick PHUONG, DO 08/28/2012 2:58 PM   08/29/2012 Spoke with patient about official xray reports of foot, ankle, and tib/fib She has a fracture inneck of 5th Metatarsal and most likely 4th No ankle or tib/fib fx or dislocation Will try to get her in to see Dr. Victorino Dike as  soon as possible She will continue to be nonweightbearing using cane and also her own shoe which functions very similar or better than a postop shoe, she states she is comfortable in it.   IMPRESSION:  Fifth and possible fourth metatarsal neck fractures. Correlate  with point tenderness.

## 2012-09-03 ENCOUNTER — Telehealth: Payer: Self-pay

## 2012-09-03 NOTE — Telephone Encounter (Signed)
Patient needs copy of x-ray from last ov.   Call [phone] when x-ray disc is ready

## 2012-09-07 NOTE — Telephone Encounter (Signed)
Notified pt xray is ready for p/up.

## 2012-09-07 NOTE — Telephone Encounter (Signed)
Pt is checking on the status of her xray request and would like a call back once ready to pick up at [phone]

## 2012-09-24 ENCOUNTER — Encounter: Payer: Self-pay | Admitting: Internal Medicine

## 2012-10-05 ENCOUNTER — Ambulatory Visit (INDEPENDENT_AMBULATORY_CARE_PROVIDER_SITE_OTHER): Payer: Medicare Other | Admitting: Internal Medicine

## 2012-10-05 ENCOUNTER — Encounter: Payer: Self-pay | Admitting: Internal Medicine

## 2012-10-05 VITALS — BP 134/80 | HR 76 | Temp 98.2°F | Resp 16 | Ht 63.0 in | Wt 235.0 lb

## 2012-10-05 DIAGNOSIS — IMO0001 Reserved for inherently not codable concepts without codable children: Secondary | ICD-10-CM

## 2012-10-05 DIAGNOSIS — I10 Essential (primary) hypertension: Secondary | ICD-10-CM

## 2012-10-05 DIAGNOSIS — Z23 Encounter for immunization: Secondary | ICD-10-CM

## 2012-10-05 DIAGNOSIS — M1710 Unilateral primary osteoarthritis, unspecified knee: Secondary | ICD-10-CM

## 2012-10-05 LAB — BASIC METABOLIC PANEL
BUN: 13 mg/dL (ref 6–23)
CO2: 31 mEq/L (ref 19–32)
Calcium: 10.4 mg/dL (ref 8.4–10.5)
Chloride: 103 mEq/L (ref 96–112)
Creatinine, Ser: 1 mg/dL (ref 0.4–1.2)
Glucose, Bld: 108 mg/dL — ABNORMAL HIGH (ref 70–99)

## 2012-10-05 NOTE — Progress Notes (Signed)
Subjective:    Patient ID: [name], female    DOB: Apr 08, 1940, 72 y.o.   MRN: [id number]  HPI Severe DJD of knees Had tried the celebrex and failed Asthma stable HTN stable Lack of exercise is the primary driver of risk   Review of Systems  Constitutional: Negative for activity change, appetite change and fatigue.  HENT: Positive for congestion. Negative for ear pain, neck pain, postnasal drip and sinus pressure.   Eyes: Negative for redness and visual disturbance.  Respiratory: Positive for shortness of breath. Negative for cough and wheezing.   Gastrointestinal: Negative for abdominal pain and abdominal distention.  Genitourinary: Negative for dysuria, frequency and menstrual problem.  Musculoskeletal: Positive for joint swelling and gait problem. Negative for myalgias and arthralgias.  Skin: Negative for rash and wound.  Neurological: Negative for dizziness, weakness and headaches.  Hematological: Negative for adenopathy. Does not bruise/bleed easily.  Psychiatric/Behavioral: Negative for sleep disturbance and decreased concentration.   Past Medical History  Diagnosis Date  . Asthma   . Depression   . Vitamin B 12 deficiency   . Anemia   . Thyroid disease   . Hypothyroidism   . Sleep difficulties     had sleep study -2009, prior to gastric surgery, told that there was not a need for f/u  . Arthritis     osteoarthritis - shoulder, knees & hips  . Hypertension     Dr. Terrilee Files manages BP, pt. reports MD has not found a need for treatment   . Diabetes mellitus without complication     no meds    History   Social History  . Marital Status: Widowed    Spouse Name: N/A    Number of Children: N/A  . Years of Education: N/A   Occupational History  . retired Engineer, civil (consulting)    Social History Main Topics  . Smoking status: Former Smoker    Quit date: 03/04/1978  . Smokeless tobacco: Not on file  . Alcohol Use: 1.2 oz/week    2 Glasses of wine per week  . Drug  Use: No  . Sexual Activity: Not Currently   Other Topics Concern  . Not on file   Social History Narrative  . No narrative on file    Past Surgical History  Procedure Laterality Date  . Abdominal hysterectomy    . Oophorectomy    . Bariatric surgery    . Pantallor arthrodesis with rod placement left foot    . Cardiac catheterization      [facility]- 30 yrs. ago  . Tonsillectomy    . Total shoulder arthroplasty Left 03/12/2012    Procedure: TOTAL SHOULDER ARTHROPLASTY;  Surgeon: Senaida Lange, MD;  Location: [facility] OR;  Service: Orthopedics;  Laterality: Left;    Family History  Problem Relation Age of Onset  . Stroke Mother   . Liver cancer Father   . Cancer Father     liver    Allergies  Allergen Reactions  . Flexeril [Cyclobenzaprine]     On Pristiq  With possible serotonin reaction to flexeril  . Ace Inhibitors   . Azithromycin     REACTION: Rash  . Codeine     REACTION: Upset stomach    Current Outpatient Prescriptions on File Prior to Visit  Medication Sig Dispense Refill  . ALPRAZolam (XANAX) 0.5 MG tablet Take 1 tablet (0.5 mg total) by mouth 3 (three) times daily as needed.  30 tablet  6  . APAP-Salicyl-Phenyltolox-Caff (CAFGESIC) 325-250-20-50 MG  CAPS Take 1 tablet by mouth every 6 (six) hours as needed. For headache      . betamethasone dipropionate (DIPROLENE) 0.05 % cream Apply topically 2 (two) times daily.  30 g  11  . Cholecalciferol (VITAMIN D) 1000 UNITS capsule Take 1,000 Units by mouth 2 (two) times daily.       . cyanocobalamin (,VITAMIN B-12,) 1000 MCG/ML injection Inject 1,000 mcg into the muscle every 30 (thirty) days. Uses on amonthly basis      . FeFum-FePo-FA-B Cmp-C-Zn-Mn-Cu (TANDEM PLUS) 162-115.2-1 MG CAPS TAKE ONE CAPSULE BY MOUTH TWICE DAILY  180 each  0  . fluticasone (FLONASE) 50 MCG/ACT nasal spray Place 2 sprays into the nose daily as needed. For allergies, nasal congestion      . Levothyroxine Sodium (TIROSINT) 100 MCG CAPS Take 200 mcg by  mouth daily before breakfast.       . Omega-3 Fatty Acids (FISH OIL) 1000 MG CAPS Take 1,000 mg by mouth 2 (two) times daily.       Marland Kitchen oxyCODONE-acetaminophen (PERCOCET/ROXICET) 5-325 MG per tablet Take 1-2 tablets by mouth every 4 (four) hours as needed.  60 tablet  0  . desvenlafaxine (PRISTIQ) 100 MG 24 hr tablet Take 100 mg by mouth daily after breakfast.       No current facility-administered medications on file prior to visit.    BP 134/80  Pulse 76  Temp(Src) 98.2 F (36.8 C)  Resp 16  Ht 5\' 3"  (1.6 m)  Wt 235 lb (106.595 kg)  BMI 41.64 kg/m2        Objective:   Physical Exam  Nursing note and vitals reviewed. Constitutional: She is oriented to person, place, and time. She appears well-developed and well-nourished. No distress.  HENT:  Head: Normocephalic and atraumatic.  Eyes: Conjunctivae and EOM are normal. Pupils are equal, round, and reactive to light.  Neck: Normal range of motion. Neck supple. No JVD present. No tracheal deviation present. No thyromegaly present.  Cardiovascular: Normal rate and regular rhythm.   Murmur heard. Pulmonary/Chest: Effort normal and breath sounds normal. She has no wheezes. She exhibits no tenderness.  Abdominal: Soft. Bowel sounds are normal.  Musculoskeletal: Normal range of motion. She exhibits no edema and no tenderness.  Lymphadenopathy:    She has no cervical adenopathy.  Neurological: She is alert and oriented to person, place, and time. She has normal reflexes. No cranial nerve deficit.  Skin: Skin is warm and dry. She is not diaphoretic.  Psychiatric: She has a normal mood and affect. Her behavior is normal.          Assessment & Plan:  HTN  Stable Asthma stable Severe DJD of knees DM monitoring with A1c and Bmet today  Informed consent obtained and the patient's right and left  knee was prepped with betadine. Local anesthesia was obtained with topical spray. Then 2 cc of synvisc was injected into the joint spaces of  both knees. The patient tolerated the procedure without complications. Post injection care discussed with patient.

## 2012-10-05 NOTE — Patient Instructions (Signed)
You have received a synvsic injection into a joint space. It will take up to 48 hours before you notice a difference in the pain in the joint. For the next few hours keep ice on the site of the injection. Do not exert the injected joint for the next 24 hours.

## 2012-10-12 ENCOUNTER — Encounter: Payer: Self-pay | Admitting: Internal Medicine

## 2012-10-12 ENCOUNTER — Ambulatory Visit: Payer: Medicare Other | Admitting: Internal Medicine

## 2012-10-12 ENCOUNTER — Ambulatory Visit (INDEPENDENT_AMBULATORY_CARE_PROVIDER_SITE_OTHER): Payer: Medicare Other | Admitting: Internal Medicine

## 2012-10-12 VITALS — BP 140/80 | HR 76 | Temp 98.2°F | Resp 16 | Ht 63.0 in | Wt 231.0 lb

## 2012-10-12 DIAGNOSIS — M1710 Unilateral primary osteoarthritis, unspecified knee: Secondary | ICD-10-CM

## 2012-10-12 DIAGNOSIS — M25569 Pain in unspecified knee: Secondary | ICD-10-CM

## 2012-10-12 DIAGNOSIS — G8929 Other chronic pain: Secondary | ICD-10-CM

## 2012-10-12 NOTE — Progress Notes (Signed)
  Subjective:    Patient ID: [name], female    DOB: [birth year], 72 y.o.   MRN: [id number]  HPI  Patient presents for the second in a series of 3 bilateral Synvisc injections for severe degenerative joint disease of her knees bilaterally  Review of Systems     Objective:   Physical Exam  Patient has significant bone-on-bone degeneration of her knees she is hoping to delay total knee replacement      Assessment & Plan:   Informed consent obtained and the patient's knee was prepped with betadine. Local anesthesia was obtained with topical spray. Then 2 cc of Synvisc was injected into both knees into the joint space. The patient tolerated the procedure without complications. Post injection care discussed with patient.

## 2012-10-14 ENCOUNTER — Other Ambulatory Visit: Payer: Self-pay | Admitting: *Deleted

## 2012-10-14 MED ORDER — TANDEM PLUS 162-115.2-1 MG PO CAPS: ORAL_CAPSULE | ORAL | Status: DC

## 2012-10-23 ENCOUNTER — Ambulatory Visit (INDEPENDENT_AMBULATORY_CARE_PROVIDER_SITE_OTHER): Payer: Medicare Other | Admitting: Internal Medicine

## 2012-10-23 ENCOUNTER — Encounter: Payer: Self-pay | Admitting: Internal Medicine

## 2012-10-23 VITALS — BP 140/80 | HR 72 | Temp 98.2°F | Resp 16

## 2012-10-23 DIAGNOSIS — M1710 Unilateral primary osteoarthritis, unspecified knee: Secondary | ICD-10-CM

## 2012-10-23 NOTE — Progress Notes (Signed)
  Subjective:    Patient ID: [name], female    DOB: 02-Oct-1940, 72 y.o.   MRN: [id number]  HPI  The synvisc is helping But she is hurting "everywhere else  Review of Systems     Objective:   Physical Exam        Assessment & Plan:  pristiq 50 for two weeks then every other day 50 for a week   Informed consent obtained and the patient's right and left knee was prepped with betadine. Local anesthesia was obtained with topical spray. Then 2 g of synvisc was injected into the joint space. The patient tolerated the procedure without complications. Post injection care discussed with patient.

## 2012-10-23 NOTE — Patient Instructions (Signed)
pristiq 50 for two weeks then every other day for one week then off

## 2012-11-10 ENCOUNTER — Telehealth: Payer: Self-pay | Admitting: Internal Medicine

## 2012-11-10 NOTE — Telephone Encounter (Signed)
Patient Information:  Caller Name: Lizmary  Phone: [phone]  Patient: [name], [name]  Gender: Female  DOB: Feb 26, 1940  Age: 72 Years  PCP: Darryll Capers (Adults only)  Office Follow Up:  Does the office need to follow up with this patient?: Yes  Instructions For The Office: RN advised that patient be seen in the office now. Patient declines office visit. Patient is requesting that Dr. Lovell Sheehan be notified of above. RN advised patient that her knee should be examined due to increase in pain, swelling and decreased mobiolity of the joint. Patient declines to be seen in the office. Patient is requesting message to be sent to Dr. Lovell Sheehan. Patient is also requesting a Rx for pain medication to be sent to [facility] on [address] at [phone]. Please return call to patient at [phone] with advice per Dr. Lovell Sheehan.  RN Note:  Patient states she received bilateral knee injections with Synvisc. States she had 3 injections. States last injection was 10/23/12. Patient states her right knee has improved. States pain and swelling increased in left knee, onset 11/05/12. States, " The entire leg is swollen." Patient states that her left knee has been locked since 11/05/12. States pain radiates from knee to ankle and hip. No redness or red streaks noted. Care advice given per guidelines. Call back parameters reviewed. RN advised that patient be seen in the office now. Patient declines office visit. Patient is requesting that Dr. Lovell Sheehan be notified of above. RN advised patient that her knee should be examined due to increase in pain, swelling and decreased mobiolity of the joint. Patient declines to be seen in the office. Patient is requesting message to be sent to Dr. Lovell Sheehan. Patient is also requesting a Rx for pain medication to be sent to [facility] on [address] at [phone]. Please return call to patient at [phone] with advice per Dr. Lovell Sheehan.  Symptoms  Reason For Call &  Symptoms: Knee pain  Reviewed Health History In EMR: Yes  Reviewed Medications In EMR: Yes  Reviewed Allergies In EMR: Yes  Reviewed Surgeries / Procedures: Yes  Date of Onset of Symptoms: 11/05/2012  Guideline(s) Used:  Knee Pain  Disposition Per Guideline:   Go to Office Now  Reason For Disposition Reached:   Can't move swollen joint at all  Advice Given:  Call Back If:  You become worse.  Rest Your Knee   for the next couple days. Avoid activities that worsen your pain. Reduce activities that put a lot of strain on the knee joint (e.g., deep knee bends, stair climbing, running).  Patient Refused Recommendation:  Patient Requests Prescription  RN advised that patient be seen in the office now. Patient declines office visit. Patient is requesting that Dr. Lovell Sheehan be notified of above. RN advised patient that her knee should be examined due to increase in pain, swelling and decreased mobiolity of the joint. Patient declines to be seen in the office. Patient is requesting message to be sent to Dr. Lovell Sheehan. Patient is also requesting a Rx for pain medication to be sent to [facility] on [address] at [phone]. Please return call to patient at [phone] with advice per Dr. Lovell Sheehan.

## 2012-11-10 NOTE — Telephone Encounter (Signed)
Talked with patient and she has ov with dr Lequita Halt on      12-5, but needs something for pain until then--using diclofenac 75 only once a day-- taking some hydrocodone at times

## 2012-11-11 ENCOUNTER — Other Ambulatory Visit: Payer: Self-pay | Admitting: *Deleted

## 2012-11-11 MED ORDER — HYDROCODONE-ACETAMINOPHEN 7.5-325 MG PO TABS: 1.0000 | ORAL_TABLET | Freq: Four times a day (QID) | ORAL | Status: DC | PRN

## 2012-11-11 NOTE — Telephone Encounter (Signed)
Make sure the knee is not "hot" The risk of a knee injection includes infection If it is red or hot she must be seen Otherwise it is ok to give her 30 days of vicoden 7.5/325 tid number 90 no refills

## 2012-11-11 NOTE — Telephone Encounter (Signed)
Talked with pt and knee is not hot nor red- instructed to call us if it becomes that way- script upfront ready for pick up

## 2012-12-15 ENCOUNTER — Telehealth: Payer: Self-pay | Admitting: Internal Medicine

## 2012-12-15 ENCOUNTER — Encounter: Payer: Self-pay | Admitting: Internal Medicine

## 2012-12-15 NOTE — Telephone Encounter (Signed)
At dr altheimers office today and bp was 187/97 and 156/84.

## 2012-12-15 NOTE — Telephone Encounter (Signed)
Pt was dx w/ high blood pressure while at another md. This  MD advised pt to fu w/ pcp w/in a week.  Pt refused to schedule w/ another provider. pls advise

## 2012-12-16 ENCOUNTER — Encounter: Payer: Self-pay | Admitting: Family

## 2012-12-16 ENCOUNTER — Ambulatory Visit (INDEPENDENT_AMBULATORY_CARE_PROVIDER_SITE_OTHER): Payer: Medicare Other | Admitting: Family

## 2012-12-16 VITALS — BP 180/100 | HR 63 | Wt 240.0 lb

## 2012-12-16 DIAGNOSIS — I10 Essential (primary) hypertension: Secondary | ICD-10-CM

## 2012-12-16 NOTE — Patient Instructions (Signed)
Sodium-Controlled Diet Sodium is a mineral. It is found in many foods. Sodium may be found naturally or added during the making of a food. The most common form of sodium is salt, which is made up of sodium and chloride. Reducing your sodium intake involves changing your eating habits. The following guidelines will help you reduce the sodium in your diet:  Stop using the salt shaker.  Use salt sparingly in cooking and baking.  Substitute with sodium-free seasonings and spices.  Do not use a salt substitute (potassium chloride) without your caregiver's permission.  Include a variety of fresh, unprocessed foods in your diet.  Limit the use of processed and convenience foods that are high in sodium. USE THE FOLLOWING FOODS SPARINGLY: Breads/Starches  Commercial bread stuffing, commercial pancake or waffle mixes, coating mixes. Waffles. Croutons. Prepared (boxed or frozen) potato, rice, or noodle mixes that contain salt or sodium. Salted French fries or hash browns. Salted popcorn, breads, crackers, chips, or snack foods. Vegetables  Vegetables canned with salt or prepared in cream, butter, or cheese sauces. Sauerkraut. Tomato or vegetable juices canned with salt.  Fresh vegetables are allowed if rinsed thoroughly. Fruit  Fruit is okay to eat. Meat and Meat Substitutes  Salted or smoked meats, such as bacon or Canadian bacon, chipped or corned beef, hot dogs, salt pork, luncheon meats, pastrami, ham, or sausage. Canned or smoked fish, poultry, or meat. Processed cheese or cheese spreads, blue or Roquefort cheese. Battered or frozen fish products. Prepared spaghetti sauce. Baked beans. Reuben sandwiches. Salted nuts. Caviar. Milk  Limit buttermilk to 1 cup per week. Soups and Combination Foods  Bouillon cubes, canned or dried soups, broth, consomm. Convenience (frozen or packaged) dinners with more than 600 mg sodium. Pot pies, pizza, Asian food, fast food cheeseburgers, and specialty  sandwiches. Desserts and Sweets  Regular (salted) desserts, pie, commercial fruit snack pies, commercial snack cakes, canned puddings.  Eat desserts and sweets in moderation. Fats and Oils  Gravy mixes or canned gravy. No more than 1 to 2 tbs of salad dressing. Chip dips.  Eat fats and oils in moderation. Beverages  See those listed under the vegetables and milk groups. Condiments  Ketchup, mustard, meat sauces, salsa, regular (salted) and lite soy sauce or mustard. Dill pickles, olives, meat tenderizer. Prepared horseradish or pickle relish. Dutch-processed cocoa. Baking powder or baking soda used medicinally. Worcestershire sauce. "Light" salt. Salt substitute, unless approved by your caregiver. Document Released: 06/29/2001 Document Revised: 04/01/2011 Document Reviewed: 01/30/2009 Exit[employer] Patient Information 2014 [employer].  

## 2012-12-16 NOTE — Progress Notes (Signed)
Pre visit review using our clinic review tool, if applicable. No additional management support is needed unless otherwise documented below in the visit note. 

## 2012-12-16 NOTE — Progress Notes (Signed)
Subjective:    Patient ID: [name], female    DOB: 02-Apr-1940, 72 y.o.   MRN: [id number]  HPI 72 year old white female, nonsmoker, patient of Dr. Lovell Sheehan is in today after her blood pressure was found to be elevated at endocrinologist office last week. She has gained approximately 12 pounds over the last 6 months. She is not currently exercising but is trying to follow a healthy diet. Does report peripheral edema at times. Denies any lightheadedness, dizziness, chest pain, palpitations, shortness of breath or edema.   Review of Systems  Constitutional: Negative.   Respiratory: Negative.   Cardiovascular: Negative.  Negative for chest pain and palpitations.  Gastrointestinal: Negative.   Endocrine: Negative.   Genitourinary: Negative.   Musculoskeletal: Negative.   Skin: Negative.   Neurological: Negative.   Hematological: Negative.   Psychiatric/Behavioral: Negative.    Past Medical History  Diagnosis Date  . Asthma   . Depression   . Vitamin B 12 deficiency   . Anemia   . Thyroid disease   . Hypothyroidism   . Sleep difficulties     had sleep study -2009, prior to gastric surgery, told that there was not a need for f/u  . Arthritis     osteoarthritis - shoulder, knees & hips  . Hypertension     Dr. Terrilee Files manages BP, pt. reports MD has not found a need for treatment   . Diabetes mellitus without complication     no meds    History   Social History  . Marital Status: Widowed    Spouse Name: N/A    Number of Children: N/A  . Years of Education: N/A   Occupational History  . retired Engineer, civil (consulting)    Social History Main Topics  . Smoking status: Former Smoker    Quit date: 03/04/1978  . Smokeless tobacco: Not on file  . Alcohol Use: 1.2 oz/week    2 Glasses of wine per week  . Drug Use: No  . Sexual Activity: Not Currently   Other Topics Concern  . Not on file   Social History Narrative  . No narrative on file    Past Surgical History    Procedure Laterality Date  . Abdominal hysterectomy    . Oophorectomy    . Bariatric surgery    . Pantallor arthrodesis with rod placement left foot    . Cardiac catheterization      [facility]- [facility] yrs. ago  . Tonsillectomy    . Total shoulder arthroplasty Left 03/12/2012    Procedure: TOTAL SHOULDER ARTHROPLASTY;  Surgeon: Senaida Lange, MD;  Location: [facility] OR;  Service: Orthopedics;  Laterality: Left;    Family History  Problem Relation Age of Onset  . Stroke Mother   . Liver cancer Father   . Cancer Father     liver    Allergies  Allergen Reactions  . Flexeril [Cyclobenzaprine]     On Pristiq  With possible serotonin reaction to flexeril  . Ace Inhibitors   . Azithromycin     REACTION: Rash  . Codeine     REACTION: Upset stomach    Current Outpatient Prescriptions on File Prior to Visit  Medication Sig Dispense Refill  . ALPRAZolam (XANAX) 0.5 MG tablet Take 1 tablet (0.5 mg total) by mouth 3 (three) times daily as needed.  30 tablet  6  . APAP-Salicyl-Phenyltolox-Caff (CAFGESIC) 325-250-20-50 MG CAPS Take 1 tablet by mouth every 6 (six) hours as needed. For headache      .  betamethasone dipropionate (DIPROLENE) 0.05 % cream Apply topically 2 (two) times daily.  30 g  11  . Cholecalciferol (VITAMIN D) 1000 UNITS capsule Take 1,000 Units by mouth 2 (two) times daily.       . cyanocobalamin (,VITAMIN B-12,) 1000 MCG/ML injection Inject 1,000 mcg into the muscle every 30 (thirty) days. Uses on amonthly basis      . diclofenac (VOLTAREN) 75 MG EC tablet Take 75 mg by mouth.      . FeFum-FePo-FA-B Cmp-C-Zn-Mn-Cu (TANDEM PLUS) 162-115.2-1 MG CAPS TAKE ONE CAPSULE BY MOUTH TWICE DAILY  180 each  3  . fluticasone (FLONASE) 50 MCG/ACT nasal spray Place 2 sprays into the nose daily as needed. For allergies, nasal congestion      . HYDROcodone-acetaminophen (NORCO) 7.5-325 MG per tablet Take 1 tablet by mouth every 6 (six) hours as needed for pain.  90 tablet  0  . Levothyroxine Sodium  (TIROSINT) 100 MCG CAPS Take 200 mcg by mouth daily before breakfast.       . Omega-3 Fatty Acids (FISH OIL) 1000 MG CAPS Take 1,000 mg by mouth 2 (two) times daily.       Marland Kitchen oxyCODONE-acetaminophen (PERCOCET/ROXICET) 5-325 MG per tablet Take 1-2 tablets by mouth every 4 (four) hours as needed.  60 tablet  0  . desvenlafaxine (PRISTIQ) 100 MG 24 hr tablet Take 100 mg by mouth daily after breakfast.       No current facility-administered medications on file prior to visit.    BP 180/100  Pulse 63  Wt 240 lb (108.863 kg)chart    Objective:   Physical Exam  Constitutional: She is oriented to person, place, and time. She appears well-developed and well-nourished.  HENT:  Right Ear: External ear normal.  Left Ear: External ear normal.  Nose: Nose normal.  Mouth/Throat: Oropharynx is clear and moist.  Neck: Normal range of motion. Neck supple.  Cardiovascular: Normal rate, regular rhythm and normal heart sounds.   Recheck blood pressure 170/102  Pulmonary/Chest: Effort normal and breath sounds normal.  Abdominal: Soft. Bowel sounds are normal.  Neurological: She is alert and oriented to person, place, and time.  Skin: Skin is warm and dry.  Psychiatric: She has a normal mood and affect.          Assessment & Plan:  Assessment: 1. Hypertension 2. Weight gain  Plan: At patient's request for samples, we'll start Benicar HCT 20/12.5 once daily. Recheck in 2-3 weeks. She is aware that this is not a generic medication. Low sodium diet. Exercise daily.

## 2012-12-21 ENCOUNTER — Telehealth: Payer: Self-pay | Admitting: Internal Medicine

## 2012-12-21 MED ORDER — DESVENLAFAXINE SUCCINATE ER 50 MG PO TB24: 50.0000 mg | ORAL_TABLET | Freq: Every day | ORAL | Status: DC

## 2012-12-21 NOTE — Telephone Encounter (Signed)
Spoke with patient she declined appt with Padonda, requested a message be sent to Dr. Lovell Sheehan to see if he would call in medication for depression.  Pt states she still has medication from before that was discontinued but wants to know if she can resume this med.  Pharmacy is [facility].

## 2012-12-21 NOTE — Telephone Encounter (Signed)
Rx sent to pharmacy and patient is aware 

## 2012-12-21 NOTE — Telephone Encounter (Signed)
Patient Information:  Caller Name: [name]  Phone: [phone]  Patient: [name], [name]  Gender: Female  DOB: 1940-02-27  Age: 72 Years  PCP: Darryll Capers (Adults only)  Office Follow Up:  Does the office need to follow up with this patient?: Yes  Instructions For The Office: Patient decides she would like script vs office visit.   Symptoms  Reason For Call & Symptoms: Off Prestique since 10/05/12; Has noticed increased emotional s/s over the last 2 wks, weeping, crying alot more.  Feels like she needs back on something.  Reviewed Health History In EMR: Yes  Reviewed Medications In EMR: Yes  Reviewed Allergies In EMR: Yes  Reviewed Surgeries / Procedures: Yes  Date of Onset of Symptoms: 12/07/2012  Guideline(s) Used:  Depression  Disposition Per Guideline:   See Within 3 Days in Office  Reason For Disposition Reached:   Patient wants to be seen  Advice Given:  Reassurance:   People with depression do get through this -- even people who feel as badly as you feel now. You can be helped.  Encourage the caller to talk about his/her problems and feelings.  Offer hope.  Stay Active  Get out of your house or apartment periodically. Go on an outing with a family member or a friend. Go to the store. Go to a movie.  Take a daily walk.  Call Back If:  You feel like harming yourself  You become worse.  Suggestions for Healthy Living  Eat healthy: Eat a well-balanced diet.  Get more sleep: Most people need 7-8 hours of sleep each night. Being well-rested improves your attitude and your sense of physical well-being.  Communicate: Share how you are feeling with someone in your life who is a good listener. Make certain that your spouse, family, or friends know how you are feeling.  Patient Refused Recommendation:  Patient Requests Prescription  Patient would like script vs ov if possible.

## 2012-12-21 NOTE — Telephone Encounter (Signed)
Resume  pristiq  Start at 50mg  daily

## 2013-01-04 ENCOUNTER — Encounter: Payer: Self-pay | Admitting: *Deleted

## 2013-01-05 ENCOUNTER — Ambulatory Visit (INDEPENDENT_AMBULATORY_CARE_PROVIDER_SITE_OTHER): Payer: Medicare Other | Admitting: Family

## 2013-01-05 ENCOUNTER — Encounter: Payer: Self-pay | Admitting: Family

## 2013-01-05 VITALS — BP 134/96 | HR 84 | Wt 234.0 lb

## 2013-01-05 DIAGNOSIS — I10 Essential (primary) hypertension: Secondary | ICD-10-CM

## 2013-01-05 DIAGNOSIS — F3289 Other specified depressive episodes: Secondary | ICD-10-CM

## 2013-01-05 DIAGNOSIS — F32A Depression, unspecified: Secondary | ICD-10-CM

## 2013-01-05 DIAGNOSIS — F329 Major depressive disorder, single episode, unspecified: Secondary | ICD-10-CM

## 2013-01-05 DIAGNOSIS — F411 Generalized anxiety disorder: Secondary | ICD-10-CM

## 2013-01-05 MED ORDER — OLMESARTAN MEDOXOMIL-HCTZ 20-12.5 MG PO TABS: 1.0000 | ORAL_TABLET | Freq: Every day | ORAL | Status: DC

## 2013-01-05 NOTE — Progress Notes (Signed)
Subjective:    Patient ID: [name], female    DOB: February 23, 1940, 72 y.o.   MRN: [id number]  HPI 72 year old white female, nonsmoker, patient of Dr. Lovell Sheehan is in today for recheck of hypertension and depression. At her last office visit we initiated Benicar HCT 20/12.5 once daily. She's tolerating the medication well. Blood pressures are running in the low 100s over 70s. She also call back to request to be put back on her antidepressant. Dr. Lovell Sheehan resumed Pristiq and she is tolerating it well at 50 mg. However in the past she's been on 100mg  in the past.    Review of Systems  Constitutional: Negative.   HENT: Negative.   Respiratory: Negative.   Cardiovascular: Negative.   Gastrointestinal: Negative.   Endocrine: Negative.   Genitourinary: Negative.   Musculoskeletal: Negative.   Skin: Negative.   Neurological: Negative.   Hematological: Negative.   Psychiatric/Behavioral: Negative.    Past Medical History  Diagnosis Date  . Asthma   . Depression   . Vitamin B 12 deficiency   . Anemia   . Thyroid disease   . Hypothyroidism   . Sleep difficulties     had sleep study -2009, prior to gastric surgery, told that there was not a need for f/u  . Arthritis     osteoarthritis - shoulder, knees & hips  . Hypertension     Dr. Terrilee Files manages BP, pt. reports MD has not found a need for treatment   . Diabetes mellitus without complication     no meds    History   Social History  . Marital Status: Widowed    Spouse Name: N/A    Number of Children: N/A  . Years of Education: N/A   Occupational History  . retired Engineer, civil (consulting)    Social History Main Topics  . Smoking status: Former Smoker    Quit date: 03/04/1978  . Smokeless tobacco: Not on file  . Alcohol Use: 1.2 oz/week    2 Glasses of wine per week  . Drug Use: No  . Sexual Activity: Not Currently   Other Topics Concern  . Not on file   Social History Narrative  . No narrative on file    Past Surgical  History  Procedure Laterality Date  . Abdominal hysterectomy    . Oophorectomy    . Bariatric surgery    . Pantallor arthrodesis with rod placement left foot    . Cardiac catheterization      [facility]- 30 yrs. ago  . Tonsillectomy    . Total shoulder arthroplasty Left 03/12/2012    Procedure: TOTAL SHOULDER ARTHROPLASTY;  Surgeon: Senaida Lange, MD;  Location: [facility] OR;  Service: Orthopedics;  Laterality: Left;    Family History  Problem Relation Age of Onset  . Stroke Mother   . Liver cancer Father   . Cancer Father     liver    Allergies  Allergen Reactions  . Flexeril [Cyclobenzaprine]     On Pristiq  With possible serotonin reaction to flexeril  . Ace Inhibitors   . Azithromycin     REACTION: Rash  . Codeine     REACTION: Upset stomach    Current Outpatient Prescriptions on File Prior to Visit  Medication Sig Dispense Refill  . ALPRAZolam (XANAX) 0.5 MG tablet Take 1 tablet (0.5 mg total) by mouth 3 (three) times daily as needed.  30 tablet  6  . APAP-Salicyl-Phenyltolox-Caff (CAFGESIC) 325-250-20-50 MG CAPS Take 1 tablet by  mouth every 6 (six) hours as needed. For headache      . betamethasone dipropionate (DIPROLENE) 0.05 % cream Apply topically 2 (two) times daily.  30 g  11  . Cholecalciferol (VITAMIN D) 1000 UNITS capsule Take 1,000 Units by mouth 2 (two) times daily.       . cyanocobalamin (,VITAMIN B-12,) 1000 MCG/ML injection Inject 1,000 mcg into the muscle every 30 (thirty) days. Uses on amonthly basis      . desvenlafaxine (PRISTIQ) 50 MG 24 hr tablet Take 1 tablet (50 mg total) by mouth daily.  30 tablet  0  . diclofenac (VOLTAREN) 75 MG EC tablet Take 75 mg by mouth.      . FeFum-FePo-FA-B Cmp-C-Zn-Mn-Cu (TANDEM PLUS) 162-115.2-1 MG CAPS TAKE ONE CAPSULE BY MOUTH TWICE DAILY  180 each  3  . fluticasone (FLONASE) 50 MCG/ACT nasal spray Place 2 sprays into the nose daily as needed. For allergies, nasal congestion      . HYDROcodone-acetaminophen (NORCO) 7.5-325 MG  per tablet Take 1 tablet by mouth every 6 (six) hours as needed for pain.  90 tablet  0  . Levothyroxine Sodium (TIROSINT) 100 MCG CAPS Take 200 mcg by mouth daily before breakfast.       . Omega-3 Fatty Acids (FISH OIL) 1000 MG CAPS Take 1,000 mg by mouth 2 (two) times daily.       Marland Kitchen oxyCODONE-acetaminophen (PERCOCET/ROXICET) 5-325 MG per tablet Take 1-2 tablets by mouth every 4 (four) hours as needed.  60 tablet  0   No current facility-administered medications on file prior to visit.    BP 134/96  Pulse 84  Wt 234 lb (106.142 kg)chart    Objective:   Physical Exam  Constitutional: She is oriented to person, place, and time. She appears well-developed and well-nourished.  HENT:  Right Ear: External ear normal.  Left Ear: External ear normal.  Nose: Nose normal.  Mouth/Throat: Oropharynx is clear and moist.  Neck: Normal range of motion. Neck supple.  Cardiovascular: Normal rate, regular rhythm and normal heart sounds.   Pulmonary/Chest: Effort normal and breath sounds normal.  Musculoskeletal: Normal range of motion.  Neurological: She is alert and oriented to person, place, and time.  Skin: Skin is warm and dry.  Psychiatric: She has a normal mood and affect.          Assessment & Plan:  Assessment: 1. Hypertension 2. Depression 3. Anxiety  Plan: Continue Pristiq. Decrease Benicar HCT to Benicar 20 mg once daily. Patient has a recheck appointment with Dr. Lovell Sheehan in February. We'll keep that office visit for reevaluation. Recheck sooner as needed.

## 2013-01-05 NOTE — Progress Notes (Signed)
Pre visit review using our clinic review tool, if applicable. No additional management support is needed unless otherwise documented below in the visit note. 

## 2013-01-05 NOTE — Patient Instructions (Signed)

## 2013-01-22 ENCOUNTER — Telehealth: Payer: Self-pay | Admitting: Internal Medicine

## 2013-01-22 NOTE — Telephone Encounter (Signed)
Taking meds appropriately

## 2013-01-22 NOTE — Telephone Encounter (Signed)
Requesting results of toxicology testing done last week.

## 2013-01-25 ENCOUNTER — Other Ambulatory Visit: Payer: Self-pay | Admitting: Internal Medicine

## 2013-01-25 MED ORDER — CYANOCOBALAMIN 1000 MCG/ML IJ SOLN: 1000.0000 ug | INTRAMUSCULAR | Status: DC

## 2013-02-12 ENCOUNTER — Encounter: Payer: Self-pay | Admitting: Internal Medicine

## 2013-02-26 ENCOUNTER — Telehealth: Payer: Self-pay | Admitting: Internal Medicine

## 2013-02-26 ENCOUNTER — Other Ambulatory Visit: Payer: Self-pay | Admitting: Internal Medicine

## 2013-02-26 MED ORDER — HYDROCODONE-ACETAMINOPHEN 7.5-325 MG PO TABS: 1.0000 | ORAL_TABLET | Freq: Four times a day (QID) | ORAL | Status: DC | PRN

## 2013-02-26 NOTE — Telephone Encounter (Signed)
Please see if pt is willing to see padonda.  Dr Arnoldo Morale is just not here that much in the next 3 weeks. thanks

## 2013-02-26 NOTE — Telephone Encounter (Signed)
Pt was scheduled for 03/15/13 for 3 mo rov/ pt does not want to wait on next available due to having to reschedule, pt want to know if dr. Arnoldo Morale can see her within the next 3 weeks

## 2013-02-26 NOTE — Telephone Encounter (Signed)
Had a cancellation for Monday and pt wound like to take that appt, scheduled pt. Pt would like to have the pristiq refilled. Pt states we should be getting a fax concernng the reduction of the med in price.

## 2013-03-01 ENCOUNTER — Telehealth: Payer: Self-pay | Admitting: Internal Medicine

## 2013-03-01 ENCOUNTER — Other Ambulatory Visit: Payer: Self-pay | Admitting: Family

## 2013-03-01 ENCOUNTER — Other Ambulatory Visit: Payer: Self-pay | Admitting: *Deleted

## 2013-03-01 ENCOUNTER — Encounter: Payer: Self-pay | Admitting: Internal Medicine

## 2013-03-01 ENCOUNTER — Ambulatory Visit (INDEPENDENT_AMBULATORY_CARE_PROVIDER_SITE_OTHER): Payer: Medicare Other | Admitting: Internal Medicine

## 2013-03-01 VITALS — BP 130/80 | HR 76 | Temp 98.3°F | Resp 16 | Ht 63.0 in | Wt 234.0 lb

## 2013-03-01 DIAGNOSIS — F3289 Other specified depressive episodes: Secondary | ICD-10-CM

## 2013-03-01 DIAGNOSIS — F32A Depression, unspecified: Secondary | ICD-10-CM

## 2013-03-01 DIAGNOSIS — M25569 Pain in unspecified knee: Secondary | ICD-10-CM

## 2013-03-01 DIAGNOSIS — I10 Essential (primary) hypertension: Secondary | ICD-10-CM

## 2013-03-01 DIAGNOSIS — Z23 Encounter for immunization: Secondary | ICD-10-CM

## 2013-03-01 DIAGNOSIS — F329 Major depressive disorder, single episode, unspecified: Secondary | ICD-10-CM

## 2013-03-01 DIAGNOSIS — G8929 Other chronic pain: Secondary | ICD-10-CM

## 2013-03-01 MED ORDER — DESVENLAFAXINE SUCCINATE ER 100 MG PO TB24: 100.0000 mg | ORAL_TABLET | Freq: Every day | ORAL | Status: DC

## 2013-03-01 MED ORDER — DICLOFENAC SODIUM 75 MG PO TBEC: 75.0000 mg | DELAYED_RELEASE_TABLET | Freq: Two times a day (BID) | ORAL | Status: DC

## 2013-03-01 NOTE — Progress Notes (Signed)
Subjective:    Patient ID: [name], female    DOB: 07/09/1940, 73 y.o.   MRN: [id number]  Hypertension Pertinent negatives include no headaches, neck pain or shortness of breath.   Failed effexor and was changed to pristiq Monitoring the medication She prefers to remain on the pristiq    Review of Systems  Constitutional: Negative for activity change, appetite change and fatigue.  HENT: Negative for congestion, ear pain, postnasal drip and sinus pressure.   Eyes: Negative for redness and visual disturbance.  Respiratory: Negative for cough, shortness of breath and wheezing.   Gastrointestinal: Negative for abdominal pain and abdominal distention.  Genitourinary: Negative for dysuria, frequency and menstrual problem.  Musculoskeletal: Positive for gait problem and joint swelling. Negative for arthralgias, myalgias and neck pain.  Skin: Negative for rash and wound.  Neurological: Negative for dizziness, weakness and headaches.  Hematological: Negative for adenopathy. Does not bruise/bleed easily.  Psychiatric/Behavioral: Positive for behavioral problems, sleep disturbance, dysphoric mood and agitation. Negative for decreased concentration. The patient is nervous/anxious.    Past Medical History  Diagnosis Date  . Asthma   . Depression   . Vitamin B 12 deficiency   . Anemia   . Thyroid disease   . Hypothyroidism   . Sleep difficulties     had sleep study -2009, prior to gastric surgery, told that there was not a need for f/u  . Arthritis     osteoarthritis - shoulder, knees & hips  . Hypertension     Dr. Orinda Kenner manages BP, pt. reports MD has not found a need for treatment   . Diabetes mellitus without complication     no meds    History   Social History  . Marital Status: Widowed    Spouse Name: N/A    Number of Children: N/A  . Years of Education: N/A   Occupational History  . retired Marine scientist    Social History Main Topics  . Smoking status: Former  Smoker    Quit date: 03/04/1978  . Smokeless tobacco: Not on file  . Alcohol Use: 1.2 oz/week    2 Glasses of wine per week  . Drug Use: No  . Sexual Activity: Not Currently   Other Topics Concern  . Not on file   Social History Narrative  . No narrative on file    Past Surgical History  Procedure Laterality Date  . Abdominal hysterectomy    . Oophorectomy    . Bariatric surgery    . Pantallor arthrodesis with rod placement left foot    . Cardiac catheterization      [facility]- [facility] yrs. ago  . Tonsillectomy    . Total shoulder arthroplasty Left 03/12/2012    Procedure: TOTAL SHOULDER ARTHROPLASTY;  Surgeon: Marin Shutter, MD;  Location: [facility];  Service: Orthopedics;  Laterality: Left;    Family History  Problem Relation Age of Onset  . Stroke Mother   . Liver cancer Father   . Cancer Father     liver    Allergies  Allergen Reactions  . Flexeril [Cyclobenzaprine]     On Pristiq  With possible serotonin reaction to flexeril  . Ace Inhibitors   . Azithromycin     REACTION: Rash  . Codeine     REACTION: Upset stomach    Current Outpatient Prescriptions on File Prior to Visit  Medication Sig Dispense Refill  . Cholecalciferol (VITAMIN D) 1000 UNITS capsule Take 1,000 Units by mouth 2 (two)  times daily.       . cyanocobalamin (,VITAMIN B-12,) 1000 MCG/ML injection Inject 1 mL (1,000 mcg total) into the muscle every 30 (thirty) days. Uses on amonthly basis  10 mL  1  . diclofenac (VOLTAREN) 75 MG EC tablet Take 75 mg by mouth.      . FeFum-FePo-FA-B Cmp-C-Zn-Mn-Cu (TANDEM PLUS) 162-115.2-1 MG CAPS TAKE ONE CAPSULE BY MOUTH TWICE DAILY  180 each  3  . HYDROcodone-acetaminophen (NORCO) 7.5-325 MG per tablet Take 1 tablet by mouth every 6 (six) hours as needed.  90 tablet  0  . Levothyroxine Sodium (TIROSINT) 100 MCG CAPS Take 200 mcg by mouth daily before breakfast.       . Omega-3 Fatty Acids (FISH OIL) 1000 MG CAPS Take 1,000 mg by mouth 2 (two) times daily.        No  current facility-administered medications on file prior to visit.    BP 130/80  Pulse 76  Temp(Src) 98.3 F (36.8 C)  Resp 16  Ht 5\' 3"  (1.6 m)  Wt 234 lb (106.142 kg)  BMI 41.46 kg/m2       Objective:   Physical Exam  Nursing note and vitals reviewed. Constitutional: She appears well-nourished.  HENT:  Head: Normocephalic and atraumatic.  Eyes: Conjunctivae are normal. Pupils are equal, round, and reactive to light.  Neck: Normal range of motion. Neck supple.  Cardiovascular: Normal rate and regular rhythm.   Pulmonary/Chest: Effort normal.  Musculoskeletal: She exhibits tenderness.  Psychiatric: She has a normal mood and affect. Her behavior is normal.          Assessment & Plan:  Screening for medication compliance Stay on pristiq  MSK pain stable Needed Rx refilled Marked mood issues and anxiety

## 2013-03-01 NOTE — Addendum Note (Signed)
Addended by: Allyne Gee on: 03/01/2013 01:56 PM   Modules accepted: Orders

## 2013-03-01 NOTE — Progress Notes (Signed)
Pre visit review using our clinic review tool, if applicable. No additional management support is needed unless otherwise documented below in the visit note. 

## 2013-03-01 NOTE — Patient Instructions (Signed)
The patient is instructed to continue all medications as prescribed. Schedule followup with check out clerk upon leaving the clinic   BP 130/80  Pulse 76  Temp(Src) 98.3 F (36.8 C)  Resp 16  Ht 5\' 3"  (1.6 m)  Wt 234 lb (106.142 kg)  BMI 41.46 kg/m2

## 2013-03-01 NOTE — Addendum Note (Signed)
Addended by: Allyne Gee on: 03/01/2013 02:10 PM   Modules accepted: Orders

## 2013-03-01 NOTE — Telephone Encounter (Signed)
done

## 2013-03-01 NOTE — Telephone Encounter (Signed)
[facility] requesting new script for diclofenac (VOLTAREN) 75 MG EC tablet

## 2013-03-02 ENCOUNTER — Encounter: Payer: Self-pay | Admitting: Internal Medicine

## 2013-03-02 ENCOUNTER — Telehealth: Payer: Self-pay | Admitting: Internal Medicine

## 2013-03-02 ENCOUNTER — Other Ambulatory Visit: Payer: Self-pay | Admitting: *Deleted

## 2013-03-02 MED ORDER — DICLOFENAC SODIUM 75 MG PO TBEC: 75.0000 mg | DELAYED_RELEASE_TABLET | Freq: Two times a day (BID) | ORAL | Status: DC

## 2013-03-02 NOTE — Telephone Encounter (Signed)
Relevant patient education assigned to patient using Emmi. ° °

## 2013-03-15 ENCOUNTER — Ambulatory Visit: Payer: Medicare Other | Admitting: Internal Medicine

## 2013-04-27 IMAGING — CR DG CHEST 2V
2 series · 2 of 2 positions shown · non-contrast
Comparison: 10/25/2009

CLINICAL DATA: Preoperative assessment for left total shoulder
arthroplasty, history asthma, hypertension, diabetes, former smoker

CHEST - 2 VIEW

[view not recorded (1 of 2)]
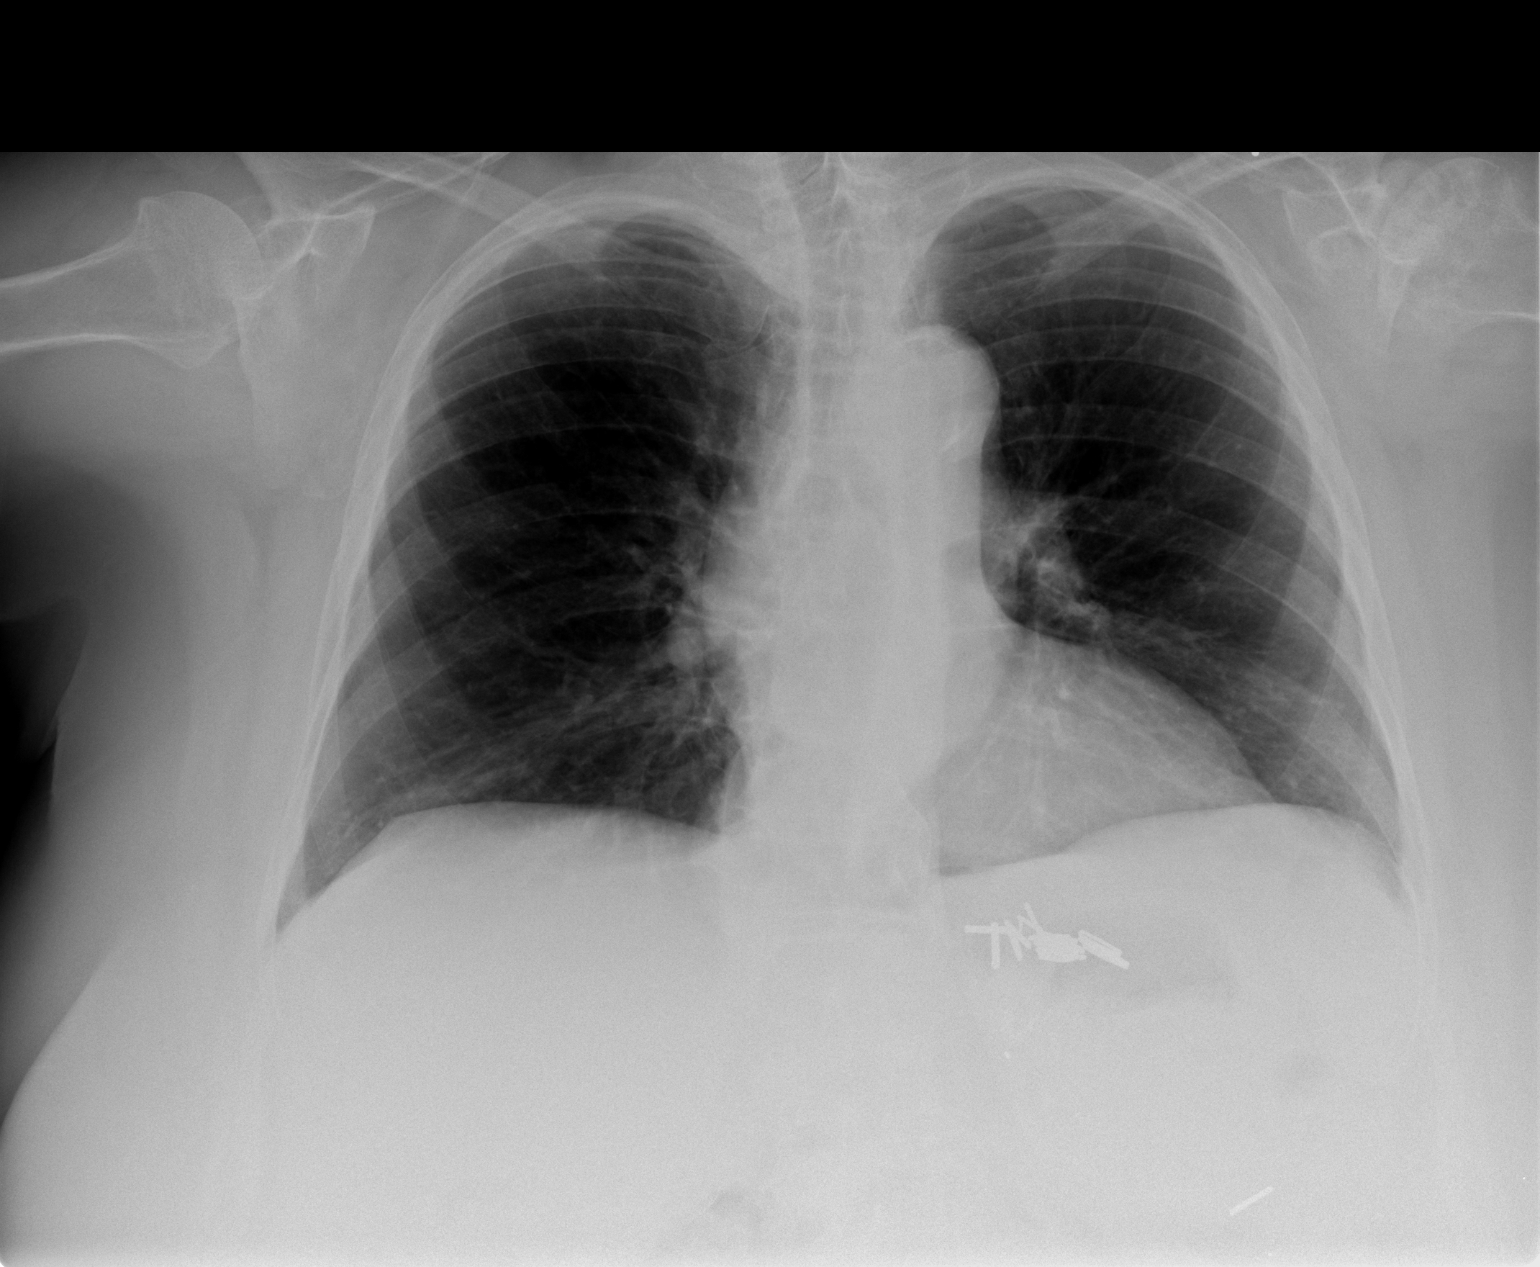

[view not recorded (2 of 2)]
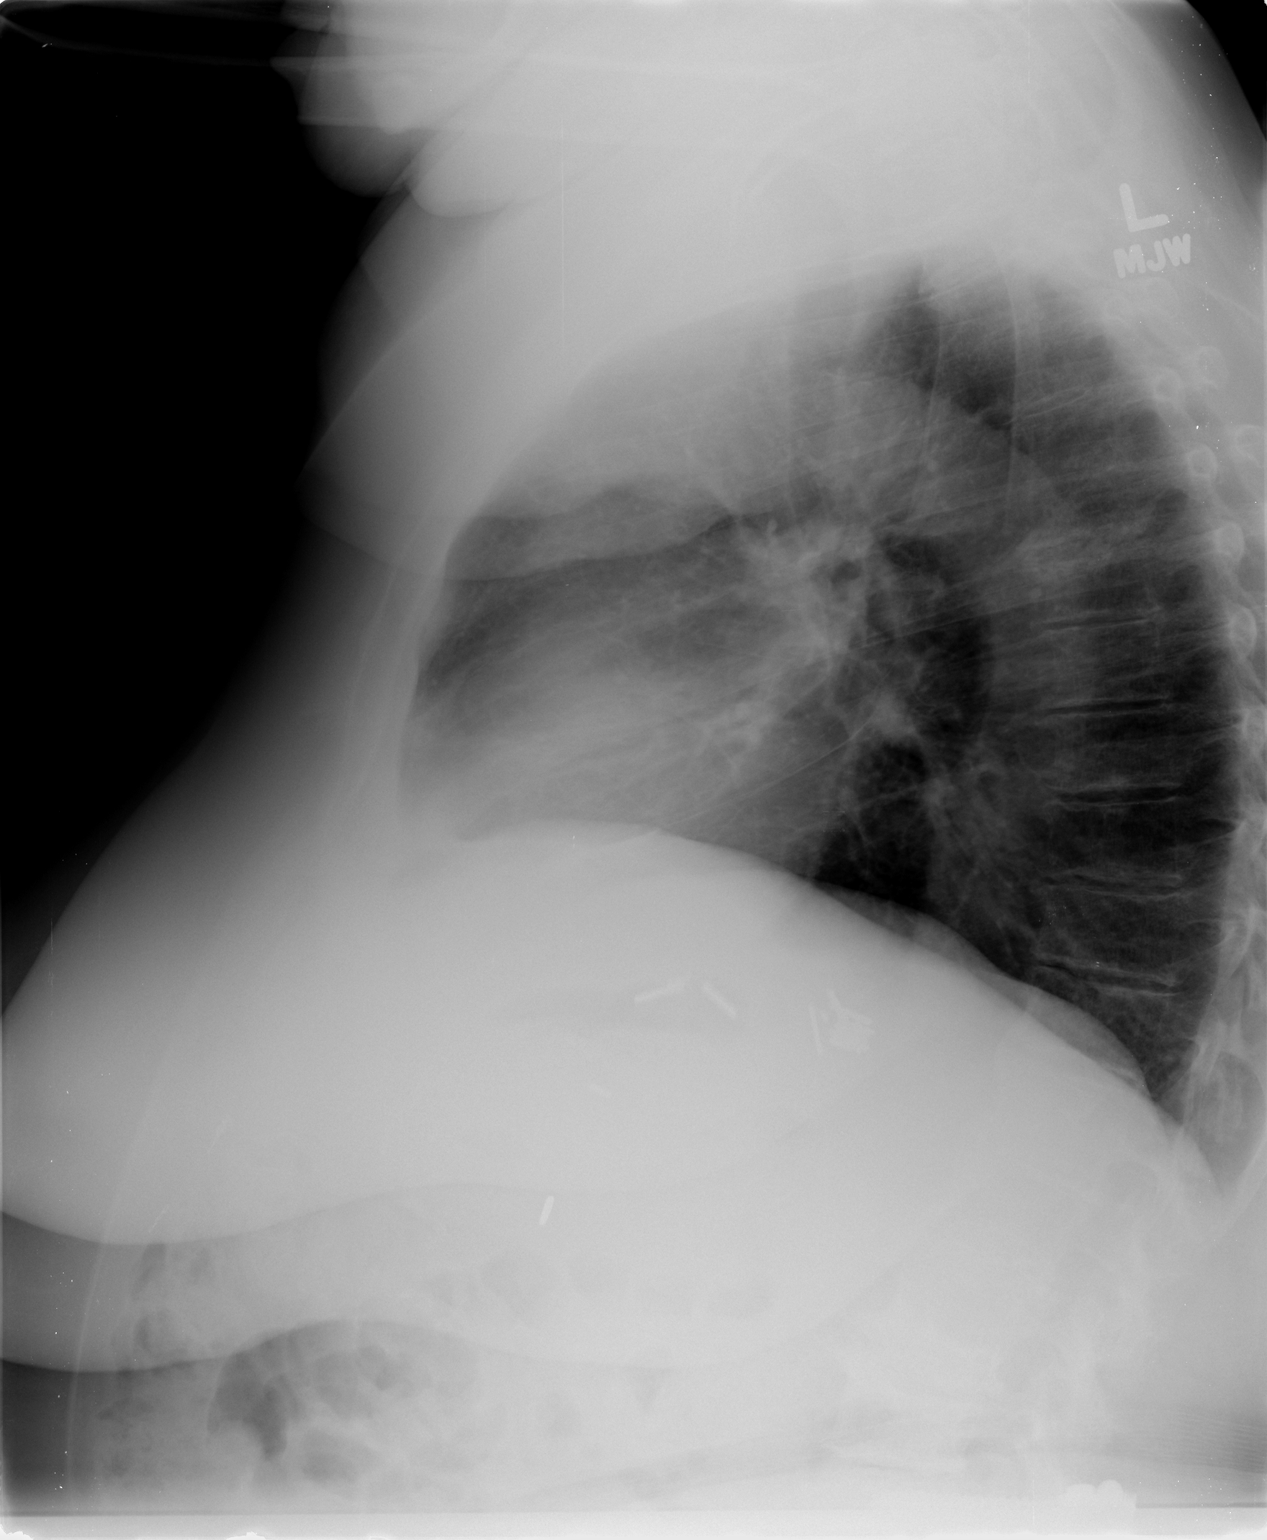

[2 of 2 positions shown; findings below may reference images not displayed]

FINDINGS: Upper-normal size of cardiac silhouette.
Calcified tortuous aorta.
Pulmonary vascularity normal.
Mild central peribronchial thickening without infiltrate, pleural
effusion or pneumothorax.
Degenerative changes left shoulder.
Perigastric surgical clips by history prior bariatric surgery.
IMPRESSION: Mild bronchitic changes.
No acute abnormalities.

## 2013-04-28 ENCOUNTER — Other Ambulatory Visit: Payer: Self-pay | Admitting: Internal Medicine

## 2013-04-28 DIAGNOSIS — Z1231 Encounter for screening mammogram for malignant neoplasm of breast: Secondary | ICD-10-CM

## 2013-05-04 ENCOUNTER — Ambulatory Visit (HOSPITAL_COMMUNITY): Payer: Medicare Other

## 2013-05-05 ENCOUNTER — Ambulatory Visit (HOSPITAL_COMMUNITY)
Admission: RE | Admit: 2013-05-05 | Discharge: 2013-05-05 | Disposition: A | Discharge status: Home / Self Care | Payer: Medicare Other | Source: Ambulatory Visit | Attending: Internal Medicine | Admitting: Internal Medicine

## 2013-05-05 DIAGNOSIS — Z1231 Encounter for screening mammogram for malignant neoplasm of breast: Secondary | ICD-10-CM

## 2013-08-02 ENCOUNTER — Other Ambulatory Visit: Payer: Medicare Other

## 2013-08-09 ENCOUNTER — Encounter: Payer: Medicare Other | Admitting: Internal Medicine

## 2013-09-28 LAB — HM DIABETES EYE EXAM

## 2013-10-05 ENCOUNTER — Encounter: Payer: Self-pay | Admitting: Family Medicine

## 2013-10-05 ENCOUNTER — Ambulatory Visit (INDEPENDENT_AMBULATORY_CARE_PROVIDER_SITE_OTHER): Payer: Medicare Other | Admitting: Family Medicine

## 2013-10-05 VITALS — BP 140/98 | HR 72 | Temp 98.4°F | Wt 228.0 lb

## 2013-10-05 DIAGNOSIS — Z23 Encounter for immunization: Secondary | ICD-10-CM

## 2013-10-05 DIAGNOSIS — E119 Type 2 diabetes mellitus without complications: Secondary | ICD-10-CM

## 2013-10-05 DIAGNOSIS — E785 Hyperlipidemia, unspecified: Secondary | ICD-10-CM

## 2013-10-05 DIAGNOSIS — M1990 Unspecified osteoarthritis, unspecified site: Secondary | ICD-10-CM | POA: Insufficient documentation

## 2013-10-05 DIAGNOSIS — I10 Essential (primary) hypertension: Secondary | ICD-10-CM

## 2013-10-05 DIAGNOSIS — M179 Osteoarthritis of knee, unspecified: Secondary | ICD-10-CM | POA: Insufficient documentation

## 2013-10-05 NOTE — Assessment & Plan Note (Signed)
Poor control. Discussed need for statin. Patient resistant due to history of intolerance. lipids today. Discussed trial of atorvastatin or pitavastatin once or twice weekly.

## 2013-10-05 NOTE — Assessment & Plan Note (Signed)
Previously diet controlled. No medications. Check A1c at this time

## 2013-10-05 NOTE — Assessment & Plan Note (Signed)
Poor control today but well control her last visit. Discussed return visit in one month but patient resistant. Agreed to 3 month followup. She will check her blood pressure at home 1 to 2 times a month and bring log. Hopeful microalbumin to creatinine ratio nonelevated but if it is may need lisinopril

## 2013-10-05 NOTE — Patient Instructions (Addendum)
My only concern today is your blood pressure. Goal <140/90. Please check at home at least 1-2x a month for next 3 months and record. Bring your readings in at that time. Continue your weight watchers and Id advise exercise to help avoid need for blood pressure medicine.   Diabetes-check a1c today  Flu shot today  Come back for labs fasting. Schedule lab visit.

## 2013-10-05 NOTE — Progress Notes (Signed)
Traci Reddish, MD Phone: [phone]  Subjective:  Patient presents today to establish care with me as their new primary care provider. Patient was formerly a patient of Dr. Arnoldo Morale. Chief complaint-noted.   DIABETES Type II-stable after gastric bypass in 2006  Lab Results  Component Value Date   HGBA1C 6.4 10/05/2012   HGBA1C 6.5 05/29/2012   HGBA1C 6.1 07/30/2011  Medications taking and tolerating-diet controlled after gastric bypass Blood Sugars per patient-fasting-does not check Diet-weight watchers Regular Exercise-advised On Aspirin-does not take On statin-does not take Daily foot monitoring-yes  ROS- Denies Polyuria,Polydipsia, nocturia, Vision changes, feet or hand numbness/pain/tingling. Denies Hypoglycemia symptoms (shaky, sweaty, hungry, weak anxious, tremor, palpitations, confusion, behavior change). No diabetic changes per eye doctor.   Hyperlipidemia-poor control On statin: no, statin intolerance in past Regular exercise: no, advised ROS- no chest pain or shortness of breath. No myalgias  Hypertension-poor control diastolic, and borderline systolic BP Readings from Last 3 Encounters:  10/05/13 140/98  03/01/13 130/80  01/05/13 134/96  Home BP monitoring-intermittently and typically 130s over 80s Compliant with medications-no medications after gastric bypass  ROS-Denies any CP, HA, SOB, blurry vision.   The following were reviewed and entered/updated in epic: Past Medical History  Diagnosis Date  . Asthma   . Depression   . Vitamin B 12 deficiency   . Anemia   . Thyroid disease   . Hypothyroidism   . Sleep difficulties     had sleep study -2009, prior to gastric surgery, told that there was not a need for f/u  . Arthritis     osteoarthritis - shoulder, knees & hips  . Hypertension     Dr. Orinda Kenner manages BP, pt. reports MD has not found a need for treatment   . Diabetes mellitus without complication     no meds   Patient Active Problem List   Diagnosis Date Noted  . DM 07/18/2006    Priority: High  . HYPERLIPIDEMIA 01/05/2008    Priority: Medium  . Unspecified hypothyroidism 09/09/2007    Priority: Medium  . DEPRESSION 07/18/2006    Priority: Medium  . HYPERTENSION 07/18/2006    Priority: Medium  . ASTHMA 07/18/2006    Priority: Medium  . Osteoarthritis 10/05/2013    Priority: Low  . CHEST WALL PAIN, ANTERIOR 10/09/2007    Priority: Low   Past Surgical History  Procedure Laterality Date  . Abdominal hysterectomy    . Oophorectomy    . Bariatric surgery    . Pantallor arthrodesis with rod placement left foot    . Cardiac catheterization      [facility]- [facility] yrs. ago  . Tonsillectomy    . Total shoulder arthroplasty Left 03/12/2012    Procedure: TOTAL SHOULDER ARTHROPLASTY;  Surgeon: Marin Shutter, MD;  Location: [facility];  Service: Orthopedics;  Laterality: Left;    Family History  Problem Relation Age of Onset  . Stroke Mother   . Liver cancer Father   . Cancer Father     liver    Medications- reviewed and updated Current Outpatient Prescriptions  Medication Sig Dispense Refill  . Cholecalciferol (VITAMIN D) 1000 UNITS capsule Take 1,000 Units by mouth 2 (two) times daily.       . cyanocobalamin (,VITAMIN B-12,) 1000 MCG/ML injection Inject 1 mL (1,000 mcg total) into the muscle every 30 (thirty) days. Uses on amonthly basis  10 mL  1  . desvenlafaxine (PRISTIQ) 100 MG 24 hr tablet Take 1 tablet (100 mg total) by mouth daily.  90 tablet  3  . diclofenac (VOLTAREN) 75 MG EC tablet Take 1 tablet (75 mg total) by mouth 2 (two) times daily.  180 tablet  3  . FeFum-FePo-FA-B Cmp-C-Zn-Mn-Cu (TANDEM PLUS) 162-115.2-1 MG CAPS TAKE ONE CAPSULE BY MOUTH TWICE DAILY  180 each  3  . Levothyroxine Sodium (TIROSINT) 100 MCG CAPS Take 200 mcg by mouth daily before breakfast.       . Omega-3 Fatty Acids (FISH OIL) 1000 MG CAPS Take 1,000 mg by mouth 2 (two) times daily.        No current facility-administered medications for this  visit.    Allergies-reviewed and updated Allergies  Allergen Reactions  . Flexeril [Cyclobenzaprine]     On Pristiq  With possible serotonin reaction to flexeril  . Ace Inhibitors   . Azithromycin     REACTION: Rash  . Codeine     REACTION: Upset stomach    History   Social History  . Marital Status: Widowed    Spouse Name: N/A    Number of Children: N/A  . Years of Education: N/A   Occupational History  . retired Marine scientist    Social History Main Topics  . Smoking status: Former Smoker    Quit date: 03/04/1978  . Smokeless tobacco: None  . Alcohol Use: 1.2 oz/week    2 Glasses of wine per week  . Drug Use: No  . Sexual Activity: Not Currently   Other Topics Concern  . None   Social History Narrative   Widowed 2013. 2 kids. 4 grandkids. Oldest daughter lives in [place] and has 2 grandkids that are now in Scientist, physiological, PT school at [employer].       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  Ext: no edema Skin: warm, dry, no rash Neuro: grossly normal, moves all extremities  Manual BP similar to initial reading  Assessment/Plan:  DM Previously diet controlled. No medications. Check A1c at this time  HYPERLIPIDEMIA Poor control. Discussed need for statin. Patient resistant due to history of intolerance. lipids today. Discussed trial of atorvastatin or pitavastatin once or twice weekly.  HYPERTENSION Poor control today but well control her last visit. Discussed return visit in one month but patient resistant. Agreed to 3 month followup. She will check her blood pressure  at home 1 to 2 times a month and bring log. Hopeful microalbumin to creatinine ratio nonelevated but if it is may need lisinopril   Future fasting labs Orders Placed This Encounter  Procedures  . Comprehensive metabolic panel    [place]    Standing Status: Future     Number of Occurrences:      Standing Expiration Date: 11/19/2013  . Lipid panel    [place]    Standing Status: Future     Number of Occurrences:      Standing Expiration Date: 11/19/2013  . Hemoglobin A1c    [place]    Standing Status: Future     Number of Occurrences:      Standing Expiration Date: 11/19/2013  . CBC    [place]    Standing  Status: Future     Number of Occurrences:      Standing Expiration Date: 11/19/2013  . Microalbumin / creatinine urine ratio    [place]    Standing Status: Future     Number of Occurrences:      Standing Expiration Date: 10/06/2014

## 2013-11-04 ENCOUNTER — Encounter: Payer: Self-pay | Admitting: Family Medicine

## 2013-11-24 ENCOUNTER — Encounter: Payer: Self-pay | Admitting: Family Medicine

## 2013-11-25 NOTE — Telephone Encounter (Signed)
Pt called to f/u on the below message.

## 2013-12-02 ENCOUNTER — Encounter: Payer: Self-pay | Admitting: Family Medicine

## 2013-12-02 ENCOUNTER — Ambulatory Visit (INDEPENDENT_AMBULATORY_CARE_PROVIDER_SITE_OTHER): Payer: Medicare Other | Admitting: Family Medicine

## 2013-12-02 VITALS — BP 130/92 | Temp 98.4°F | Wt 233.0 lb

## 2013-12-02 DIAGNOSIS — I10 Essential (primary) hypertension: Secondary | ICD-10-CM

## 2013-12-02 DIAGNOSIS — E785 Hyperlipidemia, unspecified: Secondary | ICD-10-CM

## 2013-12-02 DIAGNOSIS — E119 Type 2 diabetes mellitus without complications: Secondary | ICD-10-CM

## 2013-12-02 LAB — COMPREHENSIVE METABOLIC PANEL
ALBUMIN: 3.5 g/dL (ref 3.5–5.2)
ALT: 17 U/L (ref 0–35)
AST: 22 U/L (ref 0–37)
Alkaline Phosphatase: 106 U/L (ref 39–117)
BUN: 11 mg/dL (ref 6–23)
CALCIUM: 10.6 mg/dL — AB (ref 8.4–10.5)
CHLORIDE: 103 meq/L (ref 96–112)
CO2: 29 meq/L (ref 19–32)
CREATININE: 0.9 mg/dL (ref 0.4–1.2)
GFR: 62.69 mL/min (ref >60.00)
GLUCOSE: 124 mg/dL — AB (ref 70–99)
POTASSIUM: 4.3 meq/L (ref 3.5–5.1)
Sodium: 139 mEq/L (ref 135–145)
Total Bilirubin: 0.6 mg/dL (ref 0.2–1.2)
Total Protein: 7 g/dL (ref 6.0–8.3)

## 2013-12-02 LAB — LIPID PANEL
CHOLESTEROL: 233 mg/dL — AB (ref 0–200)
HDL: 44.4 mg/dL (ref >39.00)
LDL Cholesterol: 154 mg/dL — ABNORMAL HIGH (ref 0–99)
NonHDL: 188.6
TRIGLYCERIDES: 175 mg/dL — AB (ref 0.0–149.0)
Total CHOL/HDL Ratio: 5
VLDL: 35 mg/dL (ref 0.0–40.0)

## 2013-12-02 LAB — MICROALBUMIN / CREATININE URINE RATIO
CREATININE, U: 55.1 mg/dL
MICROALB/CREAT RATIO: 1.5 mg/g (ref 0.0–30.0)
Microalb, Ur: 0.8 mg/dL (ref 0.0–1.9)

## 2013-12-02 LAB — CBC
HEMATOCRIT: 44.5 % (ref 36.0–46.0)
Hemoglobin: 14.6 g/dL (ref 12.0–15.0)
MCHC: 32.9 g/dL (ref 30.0–36.0)
MCV: 89.1 fl (ref 78.0–100.0)
PLATELETS: 195 10*3/uL (ref 150.0–400.0)
RBC: 5 Mil/uL (ref 3.87–5.11)
RDW: 13.5 % (ref 11.5–15.5)
WBC: 6.1 10*3/uL (ref 4.0–10.5)

## 2013-12-02 LAB — HEMOGLOBIN A1C: Hgb A1c MFr Bld: 6.5 % (ref 4.6–6.5)

## 2013-12-02 MED ORDER — HYDROCHLOROTHIAZIDE 25 MG PO TABS: 25.0000 mg | ORAL_TABLET | Freq: Every day | ORAL | Status: DC

## 2013-12-02 NOTE — Progress Notes (Signed)
Garret Reddish, MD Phone: [phone]  Subjective:   [name] is a 73 y.o. year old very pleasant female patient who presents with the following:  Hypertension-poor control diastolic on 2 separate occasions  BP Readings from Last 3 Encounters:  12/02/13 130/92  10/05/13 140/98  03/01/13 130/80  Stressors: daughter that lives in [place] and had a nasty divorce and has not had anywhere to live-living out of her car.  Home BP monitoring-yes typically 160s or 170s over 90s Compliant with medications-no current medications, states on lisinopril years ago but had allergys ROS-Denies any CP, HA, SOB, blurry vision,dizziness, LE edema, transient weakness, orthopnea.   Past Medical History- Patient Active Problem List   Diagnosis Date Noted  . DM 07/18/2006    Priority: High  . HYPERLIPIDEMIA 01/05/2008    Priority: Medium  . Unspecified hypothyroidism 09/09/2007    Priority: Medium  . DEPRESSION 07/18/2006    Priority: Medium  . Essential hypertension 07/18/2006    Priority: Medium  . ASTHMA 07/18/2006    Priority: Medium  . Osteoarthritis 10/05/2013    Priority: Low  . CHEST WALL PAIN, ANTERIOR 10/09/2007    Priority: Low   Medications- reviewed and updated Current Outpatient Prescriptions  Medication Sig Dispense Refill  . Cholecalciferol (VITAMIN D) 1000 UNITS capsule Take 1,000 Units by mouth 2 (two) times daily.     . cyanocobalamin (,VITAMIN B-12,) 1000 MCG/ML injection Inject 1 mL (1,000 mcg total) into the muscle every 30 (thirty) days. Uses on amonthly basis 10 mL 1  . desvenlafaxine (PRISTIQ) 100 MG 24 hr tablet Take 1 tablet (100 mg total) by mouth daily. 90 tablet 3  . diclofenac (VOLTAREN) 75 MG EC tablet Take 1 tablet (75 mg total) by mouth 2 (two) times daily. 180 tablet 3  . FeFum-FePo-FA-B Cmp-C-Zn-Mn-Cu (TANDEM PLUS) 162-115.2-1 MG CAPS TAKE ONE CAPSULE BY MOUTH TWICE DAILY 180 each 3  . Levothyroxine Sodium (TIROSINT) 100 MCG CAPS Take 200 mcg by  mouth daily before breakfast.     . Omega-3 Fatty Acids (FISH OIL) 1000 MG CAPS Take 1,000 mg by mouth 2 (two) times daily.       Objective: BP 130/92 mmHg  Temp(Src) 98.4 F (36.9 C)  Wt 233 lb (105.688 kg) Gen: NAD, resting comfortably in chair, obese CV: RRR no murmurs rubs or gallops Lungs: CTAB no crackles, wheeze, rhonchi Abdomen: soft/nontender/nondistended/normal bowel sounds.  Ext: trace edema bilaterally Skin: warm, dry, no rash Neuro: grossly normal, moves all extremities   DM foot exam: normal, does have scar from old ankle surgery on left foot.   Results for orders placed or performed in visit on 12/02/13 (from the past 24 hour(s))  Comprehensive metabolic panel     Status: Abnormal   Collection Time: 12/02/13 12:28 PM  Result Value Ref Range   Sodium 139 135 - 145 mEq/L   Potassium 4.3 3.5 - 5.1 mEq/L   Chloride 103 96 - 112 mEq/L   CO2 29 19 - 32 mEq/L   Glucose, Bld 124 (H) 70 - 99 mg/dL   BUN 11 6 - 23 mg/dL   Creatinine, Ser 0.9 0.4 - 1.2 mg/dL   Total Bilirubin 0.6 0.2 - 1.2 mg/dL   Alkaline Phosphatase 106 39 - 117 U/L   AST 22 0 - 37 U/L   ALT 17 0 - 35 U/L   Total Protein 7.0 6.0 - 8.3 g/dL   Albumin 3.5 3.5 - 5.2 g/dL   Calcium 10.6 (H) 8.4 - 10.5 mg/dL  GFR 62.69 >60.00 mL/min  Lipid panel     Status: Abnormal   Collection Time: 12/02/13 12:28 PM  Result Value Ref Range   Cholesterol 233 (H) 0 - 200 mg/dL   Triglycerides 175.0 (H) 0.0 - 149.0 mg/dL   HDL 44.40 >39.00 mg/dL   VLDL 35.0 0.0 - 40.0 mg/dL   LDL Cholesterol 154 (H) 0 - 99 mg/dL   Total CHOL/HDL Ratio 5    NonHDL 188.60   Hemoglobin A1c     Status: None   Collection Time: 12/02/13 12:28 PM  Result Value Ref Range   Hgb A1c MFr Bld 6.5 4.6 - 6.5 %  CBC     Status: None   Collection Time: 12/02/13 12:28 PM  Result Value Ref Range   WBC 6.1 4.0 - 10.5 K/uL   RBC 5.00 3.87 - 5.11 Mil/uL   Platelets 195.0 150.0 - 400.0 K/uL   Hemoglobin 14.6 12.0 - 15.0 g/dL   HCT 44.5 36.0 -  46.0 %   MCV 89.1 78.0 - 100.0 fl   MCHC 32.9 30.0 - 36.0 g/dL   RDW 13.5 11.5 - 15.5 %  Microalbumin / creatinine urine ratio     Status: None   Collection Time: 12/02/13 12:28 PM  Result Value Ref Range   Microalb, Ur 0.8 0.0 - 1.9 mg/dL   Creatinine,U 55.1 mg/dL   Microalb Creat Ratio 1.5 0.0 - 30.0 mg/g    Assessment/Plan:  Essential hypertension Sent patient home with HCTZ but after review of labs noted calcium that tends to run borderline and just above ULN on this occasion. Change to amlodipine and recheck CMET next visit.    Return precautions advised. She is to bring home cuff to next visit (it is a wrist cuff and I ? Accuracy)  Meds ordered this encounter  Medications  . DISCONTD: hydrochlorothiazide (HYDRODIURIL) 25 MG tablet    Sig: Take 1 tablet (25 mg total) by mouth daily.    Dispense:  30 tablet    Refill:  3  . amLODipine (NORVASC) 10 MG tablet    Sig: Take 1 tablet (10 mg total) by mouth daily.    Dispense:  30 tablet    Refill:  3

## 2013-12-02 NOTE — Patient Instructions (Addendum)
Blood Pressure elevated/Hypertension  Start Hydrochlorothiazide (diuretic)  Start with 1/2 a pill for at least 3 days then can take full pill if side effects not too bad  Goal is for blood pressure at home to at least be less than 160/90. If you don't meet that goal see me sooner.   Otherwise, recheck in December.   Bring your cuff with you to next visit.   Labs today-release previous orders  Health Maintenance Due  Topic Date Due  . URINE MICROALBUMIN -today 03/16/1950  . FOOT EXAM - today, normal 09/20/2010  . HEMOGLOBIN A1C -today 04/04/2013

## 2013-12-03 MED ORDER — AMLODIPINE BESYLATE 10 MG PO TABS: 10.0000 mg | ORAL_TABLET | Freq: Every day | ORAL | Status: DC

## 2013-12-03 MED ORDER — ATORVASTATIN CALCIUM 20 MG PO TABS: 20.0000 mg | ORAL_TABLET | ORAL | Status: DC

## 2013-12-03 NOTE — Assessment & Plan Note (Signed)
Sent patient home with HCTZ but after review of labs noted calcium that tends to run borderline and just above ULN on this occasion. Change to amlodipine and recheck CMET next visit.

## 2013-12-03 NOTE — Addendum Note (Signed)
Addended by: Clyde Lundborg A on: 12/03/2013 09:59 AM   Modules accepted: Orders

## 2013-12-10 ENCOUNTER — Telehealth: Payer: Self-pay | Admitting: Family Medicine

## 2013-12-10 MED ORDER — TANDEM PLUS 162-115.2-1 MG PO CAPS: ORAL_CAPSULE | ORAL | Status: DC

## 2013-12-10 NOTE — Telephone Encounter (Signed)
Medication refilled

## 2013-12-10 NOTE — Telephone Encounter (Signed)
[id number] - [place], [place] [address] [phone] is requesting re-fill on FeFum-FePo-FA-B Cmp-C-Zn-Mn-Cu (TANDEM PLUS) 162-115.2-1 MG CAPS

## 2013-12-14 ENCOUNTER — Telehealth: Payer: Self-pay | Admitting: Family Medicine

## 2013-12-14 MED ORDER — TANDEM PLUS 162-115.2-1 MG PO CAPS: ORAL_CAPSULE | ORAL | Status: DC

## 2013-12-14 NOTE — Telephone Encounter (Signed)
Pt needs new rx tandem plus vitamins sent to [facility] [address]

## 2013-12-14 NOTE — Telephone Encounter (Signed)
Medication refilled

## 2013-12-15 MED ORDER — TANDEM PLUS 162-115.2-1 MG PO CAPS: ORAL_CAPSULE | ORAL | Status: DC

## 2013-12-15 NOTE — Addendum Note (Signed)
Addended by: Clyde Lundborg A on: 12/15/2013 03:44 PM   Modules accepted: Orders

## 2013-12-15 NOTE — Telephone Encounter (Signed)
Can you please re-send re-fill to [place], [place] - [address]

## 2013-12-15 NOTE — Telephone Encounter (Signed)
Medication resent to [facility]

## 2014-01-04 ENCOUNTER — Ambulatory Visit (INDEPENDENT_AMBULATORY_CARE_PROVIDER_SITE_OTHER): Payer: Medicare Other | Admitting: Family Medicine

## 2014-01-04 ENCOUNTER — Encounter: Payer: Self-pay | Admitting: Family Medicine

## 2014-01-04 VITALS — BP 122/82 | Temp 98.7°F | Wt 233.0 lb

## 2014-01-04 DIAGNOSIS — E785 Hyperlipidemia, unspecified: Secondary | ICD-10-CM

## 2014-01-04 DIAGNOSIS — E119 Type 2 diabetes mellitus without complications: Secondary | ICD-10-CM

## 2014-01-04 DIAGNOSIS — I10 Essential (primary) hypertension: Secondary | ICD-10-CM

## 2014-01-04 LAB — LDL CHOLESTEROL, DIRECT: Direct LDL: 119.2 mg/dL

## 2014-01-04 LAB — COMPREHENSIVE METABOLIC PANEL
ALBUMIN: 4 g/dL (ref 3.5–5.2)
ALK PHOS: 108 U/L (ref 39–117)
ALT: 24 U/L (ref 0–35)
AST: 24 U/L (ref 0–37)
BILIRUBIN TOTAL: 0.7 mg/dL (ref 0.2–1.2)
BUN: 13 mg/dL (ref 6–23)
CO2: 27 mEq/L (ref 19–32)
Calcium: 10.3 mg/dL (ref 8.4–10.5)
Chloride: 103 mEq/L (ref 96–112)
Creatinine, Ser: 0.8 mg/dL (ref 0.4–1.2)
GFR: 70.48 mL/min (ref >60.00)
GLUCOSE: 134 mg/dL — AB (ref 70–99)
Potassium: 4.1 mEq/L (ref 3.5–5.1)
SODIUM: 136 meq/L (ref 135–145)
TOTAL PROTEIN: 7.2 g/dL (ref 6.0–8.3)

## 2014-01-04 MED ORDER — DESVENLAFAXINE SUCCINATE ER 100 MG PO TB24: 100.0000 mg | ORAL_TABLET | Freq: Every day | ORAL | Status: DC

## 2014-01-04 NOTE — Patient Instructions (Signed)
Continue amlodipine. Blood pressure looks much better. Check calcium today-mention to endocrinology that it is on the upper end of normal most of the time.   Cholesterol-check levels to see if we want to continue  [facility] to see if pristiq more affordable-I sent in 90 days for you to check.   See me in 6 months-refer you to Dr. Earlean Shawl at that time.

## 2014-01-04 NOTE — Assessment & Plan Note (Signed)
Well controlled on amlodopine 10mg -contine

## 2014-01-04 NOTE — Assessment & Plan Note (Signed)
Myalgias x 2 days after once weekly statin then resolves. Tolerable. Patient wants to see results of LDL before making decision to stay on or come off. LDL came down from 154 to just under 120. Mychart message to see patient thoughts

## 2014-01-04 NOTE — Assessment & Plan Note (Signed)
a1c a month ago at 6.5 without medications. Continue eating habits-add exercise. Follow up 6 months.

## 2014-01-04 NOTE — Progress Notes (Signed)
Garret Reddish, MD Phone: [phone]  Subjective:   [name] is a 73 y.o. year old very pleasant female patient who presents with the following:  Hypertension-well controlled on amlodipine, avoiding hctz due to upper limit normal calcium  BP Readings from Last 3 Encounters:  01/04/14 122/82  12/02/13 130/92  10/05/13 140/98  Home BP monitoring-no Compliant with medications-yes without side effects ROS-Denies any CP, HA, SOB, blurry vision, worsening LE edema  Hyperlipidemia-suspect improved control  Lab Results  Component Value Date   LDLCALC 154* 12/02/2013  On statin: atorvastatin 20mg  once a week. achels all over for at least 2 days.  Regular exercise: no, advised ROS- no chest pain or shortness of breath.  Myalgias as above  DIABETES Type II-well controlled through diet  Lab Results  Component Value Date   HGBA1C 6.5 12/02/2013   HGBA1C 6.4 10/05/2012   HGBA1C 6.5 05/29/2012  Medications taking and tolerating-nonyes Blood Sugars per patient-does not check Diet-tries to limit carbs and portion size Regular Exercise-no, advised On Aspirin-need to advise On statin-yes as above Daily foot monitoring-yes ROS- Denies Polyuria,Polydipsia, nocturia, Vision changes, feet or hand numbness/pain/tingling.    Past Medical History- Patient Active Problem List   Diagnosis Date Noted  . Well controlled type 2 diabetes mellitus 07/18/2006    Priority: High  . Hyperlipidemia 01/05/2008    Priority: Medium  . Unspecified hypothyroidism 09/09/2007    Priority: Medium  . DEPRESSION 07/18/2006    Priority: Medium  . Essential hypertension 07/18/2006    Priority: Medium  . ASTHMA 07/18/2006    Priority: Medium  . Osteoarthritis 10/05/2013    Priority: Low  . CHEST WALL PAIN, ANTERIOR 10/09/2007    Priority: Low   Medications- reviewed and updated Current Outpatient Prescriptions  Medication Sig Dispense Refill  . amLODipine (NORVASC) 10 MG tablet Take 1 tablet  (10 mg total) by mouth daily. 30 tablet 3  . atorvastatin (LIPITOR) 20 MG tablet Take 1 tablet (20 mg total) by mouth once a week. 30 tablet 0  . Cholecalciferol (VITAMIN D) 1000 UNITS capsule Take 1,000 Units by mouth 2 (two) times daily.     . cyanocobalamin (,VITAMIN B-12,) 1000 MCG/ML injection Inject 1 mL (1,000 mcg total) into the muscle every 30 (thirty) days. Uses on amonthly basis 10 mL 1  . desvenlafaxine (PRISTIQ) 100 MG 24 hr tablet Take 1 tablet (100 mg total) by mouth daily. 90 tablet 0  . diclofenac (VOLTAREN) 75 MG EC tablet Take 1 tablet (75 mg total) by mouth 2 (two) times daily. 180 tablet 3  . FeFum-FePo-FA-B Cmp-C-Zn-Mn-Cu (TANDEM PLUS) 162-115.2-1 MG CAPS TAKE ONE CAPSULE BY MOUTH TWICE DAILY 180 each 2  . Levothyroxine Sodium (TIROSINT) 100 MCG CAPS Take 200 mcg by mouth daily before breakfast.     . Omega-3 Fatty Acids (FISH OIL) 1000 MG CAPS Take 1,000 mg by mouth 2 (two) times daily.      No current facility-administered medications for this visit.   Objective: BP 122/82 mmHg  Temp(Src) 98.7 F (37.1 C)  Wt 233 lb (105.688 kg) Gen: NAD, resting comfortably CV: RRR no murmurs rubs or gallops Lungs: CTAB no crackles, wheeze, rhonchi Abdomen: soft/nontender/nondistended/normal bowel sounds. No rebound or guarding.  Ext: no edema Skin: warm, dry Neuro: grossly normal, moves all extremities  Assessment/Plan:  Well controlled type 2 diabetes mellitus a1c a month ago at 6.5 without medications. Continue eating habits-add exercise. Follow up 6 months.   Essential hypertension Well controlled on amlodopine 10mg -contine  Hyperlipidemia Myalgias x 2 days after once weekly statin then resolves. Tolerable. Patient wants to see results of LDL before making decision to stay on or come off. LDL came down from 154 to just under 120. Mychart message to see patient thoughts    Results for orders placed or performed in visit on 01/04/14 (from the past 24 hour(s))  LDL  cholesterol, direct     Status: None   Collection Time: 01/04/14 11:18 AM  Result Value Ref Range   Direct LDL 119.2 mg/dL  Comprehensive metabolic panel     Status: Abnormal   Collection Time: 01/04/14 11:18 AM  Result Value Ref Range   Sodium 136 135 - 145 mEq/L   Potassium 4.1 3.5 - 5.1 mEq/L   Chloride 103 96 - 112 mEq/L   CO2 27 19 - 32 mEq/L   Glucose, Bld 134 (H) 70 - 99 mg/dL   BUN 13 6 - 23 mg/dL   Creatinine, Ser 0.8 0.4 - 1.2 mg/dL   Total Bilirubin 0.7 0.2 - 1.2 mg/dL   Alkaline Phosphatase 108 39 - 117 U/L   AST 24 0 - 37 U/L   ALT 24 0 - 35 U/L   Total Protein 7.2 6.0 - 8.3 g/dL   Albumin 4.0 3.5 - 5.2 g/dL   Calcium 10.3 8.4 - 10.5 mg/dL   GFR 70.48 >60.00 mL/min  calcium came back into normal range. Avoid hctz.   Trial pristiq [facility] as currently 333 for 3 months.  Meds ordered this encounter  Medications  . desvenlafaxine (PRISTIQ) 100 MG 24 hr tablet    Sig: Take 1 tablet (100 mg total) by mouth daily.    Dispense:  90 tablet    Refill:  0

## 2014-01-22 ENCOUNTER — Ambulatory Visit (INDEPENDENT_AMBULATORY_CARE_PROVIDER_SITE_OTHER): Payer: Medicare Other

## 2014-01-22 ENCOUNTER — Ambulatory Visit (INDEPENDENT_AMBULATORY_CARE_PROVIDER_SITE_OTHER): Payer: Medicare Other | Admitting: Family Medicine

## 2014-01-22 VITALS — BP 192/104 | HR 73 | Temp 98.1°F | Resp 18 | Ht 61.5 in | Wt 236.4 lb

## 2014-01-22 DIAGNOSIS — R059 Cough, unspecified: Secondary | ICD-10-CM

## 2014-01-22 DIAGNOSIS — R05 Cough: Secondary | ICD-10-CM

## 2014-01-22 DIAGNOSIS — J4521 Mild intermittent asthma with (acute) exacerbation: Secondary | ICD-10-CM

## 2014-01-22 DIAGNOSIS — R0789 Other chest pain: Secondary | ICD-10-CM

## 2014-01-22 MED ORDER — ALBUTEROL SULFATE HFA 108 (90 BASE) MCG/ACT IN AERS: 1.0000 | INHALATION_SPRAY | RESPIRATORY_TRACT | Status: DC | PRN

## 2014-01-22 NOTE — Patient Instructions (Addendum)
Saline nasal spray atleast 4 times per day, over the counter mucinex or mucinex DM (stop if increasing wheeze), drink plenty of fluids. Albuterol if needed, but if having to use inhaler more than 3 times per day or more than 3 days use - return here or with primary provider. Return to the clinic or go to the nearest emergency room if any of your symptoms worsen or new symptoms occur.  Restart your blood pressure medicine, but if chest tightness changes or worsens - be seen in the emergency room.   Upper Respiratory Infection, Adult An upper respiratory infection (URI) is also sometimes known as the common cold. The upper respiratory tract includes the nose, sinuses, throat, trachea, and bronchi. Bronchi are the airways leading to the lungs. Most people improve within 1 week, but symptoms can last up to 2 weeks. A residual cough may last even longer.  CAUSES Many different viruses can infect the tissues lining the upper respiratory tract. The tissues become irritated and inflamed and often become very moist. Mucus production is also common. A cold is contagious. You can easily spread the virus to others by oral contact. This includes kissing, sharing a glass, coughing, or sneezing. Touching your mouth or nose and then touching a surface, which is then touched by another person, can also spread the virus. SYMPTOMS  Symptoms typically develop 1 to 3 days after you come in contact with a cold virus. Symptoms vary from person to person. They may include:  Runny nose.  Sneezing.  Nasal congestion.  Sinus irritation.  Sore throat.  Loss of voice (laryngitis).  Cough.  Fatigue.  Muscle aches.  Loss of appetite.  Headache.  Low-grade fever. DIAGNOSIS  You might diagnose your own cold based on familiar symptoms, since most people get a cold 2 to 3 times a year. Your caregiver can confirm this based on your exam. Most importantly, your caregiver can check that your symptoms are not due to  another disease such as strep throat, sinusitis, pneumonia, asthma, or epiglottitis. Blood tests, throat tests, and X-rays are not necessary to diagnose a common cold, but they may sometimes be helpful in excluding other more serious diseases. Your caregiver will decide if any further tests are required. RISKS AND COMPLICATIONS  You may be at risk for a more severe case of the common cold if you smoke cigarettes, have chronic heart disease (such as heart failure) or lung disease (such as asthma), or if you have a weakened immune system. The very young and very old are also at risk for more serious infections. Bacterial sinusitis, middle ear infections, and bacterial pneumonia can complicate the common cold. The common cold can worsen asthma and chronic obstructive pulmonary disease (COPD). Sometimes, these complications can require emergency medical care and may be life-threatening. PREVENTION  The best way to protect against getting a cold is to practice good hygiene. Avoid oral or hand contact with people with cold symptoms. Wash your hands often if contact occurs. There is no clear evidence that vitamin C, vitamin E, echinacea, or exercise reduces the chance of developing a cold. However, it is always recommended to get plenty of rest and practice good nutrition. TREATMENT  Treatment is directed at relieving symptoms. There is no cure. Antibiotics are not effective, because the infection is caused by a virus, not by bacteria. Treatment may include:  Increased fluid intake. Sports drinks offer valuable electrolytes, sugars, and fluids.  Breathing heated mist or steam (vaporizer or shower).  Eating chicken  soup or other clear broths, and maintaining good nutrition.  Getting plenty of rest.  Using gargles or lozenges for comfort.  Controlling fevers with ibuprofen or acetaminophen as directed by your caregiver.  Increasing usage of your inhaler if you have asthma. Zinc gel and zinc lozenges,  taken in the first 24 hours of the common cold, can shorten the duration and lessen the severity of symptoms. Pain medicines may help with fever, muscle aches, and throat pain. A variety of non-prescription medicines are available to treat congestion and runny nose. Your caregiver can make recommendations and may suggest nasal or lung inhalers for other symptoms.  HOME CARE INSTRUCTIONS   Only take over-the-counter or prescription medicines for pain, discomfort, or fever as directed by your caregiver.  Use a warm mist humidifier or inhale steam from a shower to increase air moisture. This may keep secretions moist and make it easier to breathe.  Drink enough water and fluids to keep your urine clear or pale yellow.  Rest as needed.  Return to work when your temperature has returned to normal or as your caregiver advises. You may need to stay home longer to avoid infecting others. You can also use a face mask and careful hand washing to prevent spread of the virus. SEEK MEDICAL CARE IF:   After the first few days, you feel you are getting worse rather than better.  You need your caregiver's advice about medicines to control symptoms.  You develop chills, worsening shortness of breath, or brown or red sputum. These may be signs of pneumonia.  You develop yellow or brown nasal discharge or pain in the face, especially when you bend forward. These may be signs of sinusitis.  You develop a fever, swollen neck glands, pain with swallowing, or white areas in the back of your throat. These may be signs of strep throat. SEEK IMMEDIATE MEDICAL CARE IF:   You have a fever.  You develop severe or persistent headache, ear pain, sinus pain, or chest pain.  You develop wheezing, a prolonged cough, cough up blood, or have a change in your usual mucus (if you have chronic lung disease).  You develop sore muscles or a stiff neck. Document Released: 07/03/2000 Document Revised: 04/01/2011 Document  Reviewed: 04/14/2013 [facility] Patient Information 2015 [place], [place]. This information is not intended to replace advice given to you by your health care provider. Make sure you discuss any questions you have with your health care provider.   Bronchospasm A bronchospasm is when the tubes that carry air in and out of your lungs (airways) spasm or tighten. During a bronchospasm it is hard to breathe. This is because the airways get smaller. A bronchospasm can be triggered by:  Allergies. These may be to animals, pollen, food, or mold.  Infection. This is a common cause of bronchospasm.  Exercise.  Irritants. These include pollution, cigarette smoke, strong odors, aerosol sprays, and paint fumes.  Weather changes.  Stress.  Being emotional. HOME CARE   Always have a plan for getting help. Know when to call your doctor and local emergency services (911 in the [place] Know where you can get emergency care.  Only take medicines as told by your doctor.  If you were prescribed an inhaler or nebulizer machine, ask your doctor how to use it correctly. Always use a spacer with your inhaler if you were given one.  Stay calm during an attack. Try to relax and breathe more slowly.  Control your home environment:  Change your heating and air conditioning filter at least once a month.  Limit your use of fireplaces and wood stoves.  Do not  smoke. Do not  allow smoking in your home.  Avoid perfumes and fragrances.  Get rid of pests (such as roaches and mice) and their droppings.  Throw away plants if you see mold on them.  Keep your house clean and dust free.  Replace carpet with wood, tile, or vinyl flooring. Carpet can trap dander and dust.  Use allergy-proof pillows, mattress covers, and box spring covers.  Wash bed sheets and blankets every week in hot water. Dry them in a dryer.  Use blankets that are made of polyester or cotton.  Wash hands frequently. GET HELP IF:  You  have muscle aches.  You have chest pain.  The thick spit you spit or cough up (sputum) changes from clear or white to yellow, green, gray, or bloody.  The thick spit you spit or cough up gets thicker.  There are problems that may be related to the medicine you are given such as:  A rash.  Itching.  Swelling.  Trouble breathing. GET HELP RIGHT AWAY IF:  You feel you cannot breathe or catch your breath.  You cannot stop coughing.  Your treatment is not helping you breathe better.  You have very bad chest pain. MAKE SURE YOU:   Understand these instructions.  Will watch your condition.  Will get help right away if you are not doing well or get worse. Document Released: 11/04/2008 Document Revised: 01/12/2013 Document Reviewed: 06/30/2012 [facility] Patient Information 2015 [place], [place]. This information is not intended to replace advice given to you by your health care provider. Make sure you discuss any questions you have with your health care provider.

## 2014-01-22 NOTE — Progress Notes (Addendum)
Subjective:    Patient ID: [name], female    DOB: 12-04-1940, 74 y.o.   MRN: [id number] This chart was scribed for Merri Ray, MD by Marti Sleigh, Medical Scribe. This patient was seen in Room 13 and the patient's care was started a 3:27 PM.  HPI HPI Comments: [name] is a 74 y.o. female with a hx of HTN, DM, asthma who presents to [facility] complaining of cough, nasal congestion and ear pain that started three days ago. Pt endorses associated sore throat, chest congestion, sinus congestion, ear pain and congestion. Pt endorses chest tightness that radiates to her back on the right side. Pt denies wheezing. Pt has an inhaler at home. Pt has not used her inhaler in years. Pt was hospitalized many years ago for asthma sx. Pt states she has not taken her HTN medication for the last several days.   Pt was seen by her PCP by Dr. Yong Channel, two weeks ago. Home BP has been 122-140/82-98. 122/82 in PCP's office. Pts last A1C was 6.5 November of 2015.   Patient Active Problem List   Diagnosis Date Noted  . Osteoarthritis 10/05/2013  . Hyperlipidemia 01/05/2008  . CHEST WALL PAIN, ANTERIOR 10/09/2007  . Unspecified hypothyroidism 09/09/2007  . Well controlled type 2 diabetes mellitus 07/18/2006  . DEPRESSION 07/18/2006  . Essential hypertension 07/18/2006  . ASTHMA 07/18/2006   Past Medical History  Diagnosis Date  . Asthma   . Depression   . Vitamin B 12 deficiency   . Anemia   . Thyroid disease   . Hypothyroidism   . Sleep difficulties     had sleep study -2009, prior to gastric surgery, told that there was not a need for f/u  . Arthritis     osteoarthritis - shoulder, knees & hips  . Hypertension     Dr. Orinda Kenner manages BP, pt. reports MD has not found a need for treatment   . Diabetes mellitus without complication     no meds   Past Surgical History  Procedure Laterality Date  . Abdominal hysterectomy    . Oophorectomy    . Bariatric surgery    .  Pantallor arthrodesis with rod placement left foot    . Cardiac catheterization      [facility]- 30 yrs. ago  . Tonsillectomy    . Total shoulder arthroplasty Left 03/12/2012    Procedure: TOTAL SHOULDER ARTHROPLASTY;  Surgeon: Marin Shutter, MD;  Location: [place];  Service: Orthopedics;  Laterality: Left;   Allergies  Allergen Reactions  . Flexeril [Cyclobenzaprine]     On Pristiq  With possible serotonin reaction to flexeril  . Ace Inhibitors     States after a surgery she was told this. Not sure what the reason was.   . Azithromycin     REACTION: Rash  . Codeine     REACTION: Upset stomach   Prior to Admission medications   Medication Sig Start Date End Date Taking? Authorizing Provider  amLODipine (NORVASC) 10 MG tablet Take 1 tablet (10 mg total) by mouth daily. 12/03/13  Yes Marin Olp, MD  atorvastatin (LIPITOR) 20 MG tablet Take 1 tablet (20 mg total) by mouth once a week. 12/03/13  Yes Marin Olp, MD  Cholecalciferol (VITAMIN D) 1000 UNITS capsule Take 1,000 Units by mouth 2 (two) times daily.    Yes Historical Provider, MD  cyanocobalamin (,VITAMIN B-12,) 1000 MCG/ML injection Inject 1 mL (1,000 mcg total) into the muscle every  30 (thirty) days. Uses on amonthly basis 01/25/13  Yes Ricard Dillon, MD  desvenlafaxine (PRISTIQ) 100 MG 24 hr tablet Take 1 tablet (100 mg total) by mouth daily. 01/04/14  Yes Marin Olp, MD  diclofenac (VOLTAREN) 75 MG EC tablet Take 1 tablet (75 mg total) by mouth 2 (two) times daily. 03/02/13  Yes Ricard Dillon, MD  FeFum-FePo-FA-B Cmp-C-Zn-Mn-Cu (TANDEM PLUS) 162-115.2-1 MG CAPS TAKE ONE CAPSULE BY MOUTH TWICE DAILY 12/15/13  Yes Marin Olp, MD  Levothyroxine Sodium (TIROSINT) 100 MCG CAPS Take 200 mcg by mouth daily before breakfast.    Yes Historical Provider, MD  Omega-3 Fatty Acids (FISH OIL) 1000 MG CAPS Take 1,000 mg by mouth 2 (two) times daily.    Yes Historical Provider, MD   History   Social History  . Marital Status:  Widowed    Spouse Name: N/A    Number of Children: N/A  . Years of Education: N/A   Occupational History  . retired Marine scientist    Social History Main Topics  . Smoking status: Former Smoker    Quit date: 03/04/1978  . Smokeless tobacco: Not on file  . Alcohol Use: 1.2 oz/week    2 Glasses of wine per week  . Drug Use: No  . Sexual Activity: Not Currently   Other Topics Concern  . Not on file   Social History Narrative   Widowed 2013. 2 kids. 4 grandkids. Oldest daughter lives in [place] and has 2 grandkids that are now in Scientist, physiological, PT school at [employer].       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  HENT: Positive for congestion, sinus pressure and sore throat.   Respiratory: Positive for cough and shortness of breath. Negative for wheezing.   Cardiovascular: Negative for chest pain.       Objective:   Physical Exam  Constitutional: She is oriented to person, place, and time. She appears well-developed and well-nourished. No distress.  HENT:  Head: Normocephalic and atraumatic.  Right Ear: Hearing, tympanic membrane, external ear and ear canal normal.  Left Ear: Hearing, tympanic membrane, external ear and ear canal normal.  Nose: Nose normal.  Mouth/Throat: Oropharynx is clear and moist. No oropharyngeal exudate.  Unable to visualize TMs due to cerumen bilaterally, hearing is unaffected.  Eyes: Conjunctivae and EOM are normal. Pupils are equal, round, and reactive to light.  Neck: Neck supple.  Cardiovascular: Normal rate, regular rhythm, normal heart sounds and intact distal pulses.   No murmur heard. Pulmonary/Chest: Effort normal and breath sounds normal. No respiratory distress. She has no wheezes. She has no rhonchi.  Faint expiratory wheeze heard one time.  Musculoskeletal: She exhibits edema.    1+ pedal edema.  Neurological: She is alert and oriented to person, place, and time.  Skin: Skin is warm and dry. No rash noted.  Psychiatric: She has a normal mood and affect. Her behavior is normal.  Nursing note and vitals reviewed.   Filed Vitals:   01/22/14 1455  BP: 192/104  Pulse: 73  Temp: 98.1 F (36.7 C)  TempSrc: Oral  Resp: 18  Height: 5' 1.5" (1.562 m)  Weight: 236 lb 6.4 oz (107.23 kg)  SpO2: 95%   EKG (compared to 02/2012):  No apparent changes or acute findings.  [facility] reading (  PRIMARY) by  Dr. Carlota Raspberry:  No acute findings. No apparent infiltrate.        Assessment & Plan:   [name] is a 74 y.o. female Cough - Plan: EKG 12-Lead, DG Chest 2 View  Chest tightness - Plan: EKG 12-Lead, DG Chest 2 View  Asthma with acute exacerbation, mild intermittent - Plan: EKG 12-Lead, DG Chest 2 View, albuterol (PROVENTIL HFA;VENTOLIN HFA) 108 (90 BASE) MCG/ACT inhaler   URI with some flair of underlying asthma sx's. Afebrile, reassuring EKG.  Suspect congestion and some of underlying bronchospasm may be contributing to chest tightness.  Sx care with saline ns, mucinex for cough if not increasing wheeze, and rtc precautions.   Restart antihypertensives, and if any change in chest symptoms - to ER for eval.    Meds ordered this encounter  Medications  . albuterol (PROVENTIL HFA;VENTOLIN HFA) 108 (90 BASE) MCG/ACT inhaler    Sig: Inhale 1-2 puffs into the lungs every 4 (four) hours as needed for wheezing or shortness of breath.    Dispense:  1 Inhaler    Refill:  0   Patient Instructions  Saline nasal spray atleast 4 times per day, over the counter mucinex or mucinex DM (stop if increasing wheeze), drink plenty of fluids. Albuterol if needed, but if having to use inhaler more than 3 times per day or more than 3 days use - return here or with primary provider. Return to the clinic or go to the nearest emergency room if any of your symptoms worsen or new symptoms  occur.  Restart your blood pressure medicine, but if chest tightness changes or worsens - be seen in the emergency room.   Upper Respiratory Infection, Adult An upper respiratory infection (URI) is also sometimes known as the common cold. The upper respiratory tract includes the nose, sinuses, throat, trachea, and bronchi. Bronchi are the airways leading to the lungs. Most people improve within 1 week, but symptoms can last up to 2 weeks. A residual cough may last even longer.  CAUSES Many different viruses can infect the tissues lining the upper respiratory tract. The tissues become irritated and inflamed and often become very moist. Mucus production is also common. A cold is contagious. You can easily spread the virus to others by oral contact. This includes kissing, sharing a glass, coughing, or sneezing. Touching your mouth or nose and then touching a surface, which is then touched by another person, can also spread the virus. SYMPTOMS  Symptoms typically develop 1 to 3 days after you come in contact with a cold virus. Symptoms vary from person to person. They may include:  Runny nose.  Sneezing.  Nasal congestion.  Sinus irritation.  Sore throat.  Loss of voice (laryngitis).  Cough.  Fatigue.  Muscle aches.  Loss of appetite.  Headache.  Low-grade fever. DIAGNOSIS  You might diagnose your own cold based on familiar symptoms, since most people get a cold 2 to 3 times a year. Your caregiver can confirm this based on your exam. Most importantly, your caregiver can check that your symptoms are not due to another disease such as strep throat, sinusitis, pneumonia, asthma, or epiglottitis. Blood tests, throat tests, and X-rays are not necessary to diagnose a common cold, but they may sometimes be helpful in excluding other more serious diseases. Your caregiver will decide if any further tests are required. RISKS AND COMPLICATIONS  You may be at risk for a more severe case of the  common cold if you  smoke cigarettes, have chronic heart disease (such as heart failure) or lung disease (such as asthma), or if you have a weakened immune system. The very young and very old are also at risk for more serious infections. Bacterial sinusitis, middle ear infections, and bacterial pneumonia can complicate the common cold. The common cold can worsen asthma and chronic obstructive pulmonary disease (COPD). Sometimes, these complications can require emergency medical care and may be life-threatening. PREVENTION  The best way to protect against getting a cold is to practice good hygiene. Avoid oral or hand contact with people with cold symptoms. Wash your hands often if contact occurs. There is no clear evidence that vitamin C, vitamin E, echinacea, or exercise reduces the chance of developing a cold. However, it is always recommended to get plenty of rest and practice good nutrition. TREATMENT  Treatment is directed at relieving symptoms. There is no cure. Antibiotics are not effective, because the infection is caused by a virus, not by bacteria. Treatment may include:  Increased fluid intake. Sports drinks offer valuable electrolytes, sugars, and fluids.  Breathing heated mist or steam (vaporizer or shower).  Eating chicken soup or other clear broths, and maintaining good nutrition.  Getting plenty of rest.  Using gargles or lozenges for comfort.  Controlling fevers with ibuprofen or acetaminophen as directed by your caregiver.  Increasing usage of your inhaler if you have asthma. Zinc gel and zinc lozenges, taken in the first 24 hours of the common cold, can shorten the duration and lessen the severity of symptoms. Pain medicines may help with fever, muscle aches, and throat pain. A variety of non-prescription medicines are available to treat congestion and runny nose. Your caregiver can make recommendations and may suggest nasal or lung inhalers for other symptoms.  HOME CARE  INSTRUCTIONS   Only take over-the-counter or prescription medicines for pain, discomfort, or fever as directed by your caregiver.  Use a warm mist humidifier or inhale steam from a shower to increase air moisture. This may keep secretions moist and make it easier to breathe.  Drink enough water and fluids to keep your urine clear or pale yellow.  Rest as needed.  Return to work when your temperature has returned to normal or as your caregiver advises. You may need to stay home longer to avoid infecting others. You can also use a face mask and careful hand washing to prevent spread of the virus. SEEK MEDICAL CARE IF:   After the first few days, you feel you are getting worse rather than better.  You need your caregiver's advice about medicines to control symptoms.  You develop chills, worsening shortness of breath, or brown or red sputum. These may be signs of pneumonia.  You develop yellow or brown nasal discharge or pain in the face, especially when you bend forward. These may be signs of sinusitis.  You develop a fever, swollen neck glands, pain with swallowing, or white areas in the back of your throat. These may be signs of strep throat. SEEK IMMEDIATE MEDICAL CARE IF:   You have a fever.  You develop severe or persistent headache, ear pain, sinus pain, or chest pain.  You develop wheezing, a prolonged cough, cough up blood, or have a change in your usual mucus (if you have chronic lung disease).  You develop sore muscles or a stiff neck. Document Released: 07/03/2000 Document Revised: 04/01/2011 Document Reviewed: 04/14/2013 [facility] Patient Information 2015 [place], [place]. This information is not intended to replace advice given to you  by your health care provider. Make sure you discuss any questions you have with your health care provider.   Bronchospasm A bronchospasm is when the tubes that carry air in and out of your lungs (airways) spasm or tighten. During a  bronchospasm it is hard to breathe. This is because the airways get smaller. A bronchospasm can be triggered by:  Allergies. These may be to animals, pollen, food, or mold.  Infection. This is a common cause of bronchospasm.  Exercise.  Irritants. These include pollution, cigarette smoke, strong odors, aerosol sprays, and paint fumes.  Weather changes.  Stress.  Being emotional. HOME CARE   Always have a plan for getting help. Know when to call your doctor and local emergency services (911 in the [place] Know where you can get emergency care.  Only take medicines as told by your doctor.  If you were prescribed an inhaler or nebulizer machine, ask your doctor how to use it correctly. Always use a spacer with your inhaler if you were given one.  Stay calm during an attack. Try to relax and breathe more slowly.  Control your home environment:  Change your heating and air conditioning filter at least once a month.  Limit your use of fireplaces and wood stoves.  Do not  smoke. Do not  allow smoking in your home.  Avoid perfumes and fragrances.  Get rid of pests (such as roaches and mice) and their droppings.  Throw away plants if you see mold on them.  Keep your house clean and dust free.  Replace carpet with wood, tile, or vinyl flooring. Carpet can trap dander and dust.  Use allergy-proof pillows, mattress covers, and box spring covers.  Wash bed sheets and blankets every week in hot water. Dry them in a dryer.  Use blankets that are made of polyester or cotton.  Wash hands frequently. GET HELP IF:  You have muscle aches.  You have chest pain.  The thick spit you spit or cough up (sputum) changes from clear or white to yellow, green, gray, or bloody.  The thick spit you spit or cough up gets thicker.  There are problems that may be related to the medicine you are given such as:  A rash.  Itching.  Swelling.  Trouble breathing. GET HELP RIGHT AWAY  IF:  You feel you cannot breathe or catch your breath.  You cannot stop coughing.  Your treatment is not helping you breathe better.  You have very bad chest pain. MAKE SURE YOU:   Understand these instructions.  Will watch your condition.  Will get help right away if you are not doing well or get worse. Document Released: 11/04/2008 Document Revised: 01/12/2013 Document Reviewed: 06/30/2012 [facility] Patient Information 2015 [place], [place]. This information is not intended to replace advice given to you by your health care provider. Make sure you discuss any questions you have with your health care provider.        I personally performed the services described in this documentation, which was scribed in my presence. The recorded information has been reviewed and considered, and addended by me as needed.

## 2014-02-08 ENCOUNTER — Ambulatory Visit (INDEPENDENT_AMBULATORY_CARE_PROVIDER_SITE_OTHER): Payer: Medicare Other | Admitting: Family Medicine

## 2014-02-08 ENCOUNTER — Encounter: Payer: Self-pay | Admitting: Family Medicine

## 2014-02-08 VITALS — BP 140/92 | HR 72 | Temp 98.4°F | Wt 234.0 lb

## 2014-02-08 DIAGNOSIS — I10 Essential (primary) hypertension: Secondary | ICD-10-CM

## 2014-02-08 DIAGNOSIS — J0100 Acute maxillary sinusitis, unspecified: Secondary | ICD-10-CM

## 2014-02-08 DIAGNOSIS — Z9884 Bariatric surgery status: Secondary | ICD-10-CM

## 2014-02-08 MED ORDER — AMLODIPINE BESYLATE 10 MG PO TABS: 10.0000 mg | ORAL_TABLET | Freq: Every day | ORAL | Status: DC

## 2014-02-08 MED ORDER — PREDNISONE 20 MG PO TABS: ORAL_TABLET | ORAL | Status: DC

## 2014-02-08 NOTE — Progress Notes (Signed)
Garret Reddish, MD Phone: [phone]  Subjective:   [name] is a 74 y.o. year old very pleasant female patient who presents with the following:  Cough/sinus congestion and pressureg for 20 days 30th of December went to [place]. Started with sore throat, achiness at end of trip. Took tylenol and drank fluids and symptoms got worse. Hoarse voice, started coughing. Seen at [name] 01/22/14-Did CXR which was normal. Thought to have asthma exacerbation and refilled albuterol. THought she had URI flaring asthma.   Symptoms seemed to continue and states also describes symptoms of sinus pressure and congestion during this time. Worked all day Friday and symptoms worsened. Sore throat, coughing nonproductive, aching all over, sinus congestion, sinus pressure. No other treatments tried. Only thing that seems to help is rest.   ROS- no fever/chills/nausea/vomiting.   Past Medical History- Patient Active Problem List   Diagnosis Date Noted  . Well controlled type 2 diabetes mellitus 07/18/2006    Priority: High  . Hyperlipidemia 01/05/2008    Priority: Medium  . Unspecified hypothyroidism 09/09/2007    Priority: Medium  . DEPRESSION 07/18/2006    Priority: Medium  . Essential hypertension 07/18/2006    Priority: Medium  . ASTHMA 07/18/2006    Priority: Medium  . Osteoarthritis 10/05/2013    Priority: Low  . CHEST WALL PAIN, ANTERIOR 10/09/2007    Priority: Low   Medications- reviewed and updated Current Outpatient Prescriptions  Medication Sig Dispense Refill  . amLODipine (NORVASC) 10 MG tablet Take 1 tablet (10 mg total) by mouth daily. 30 tablet 3  . atorvastatin (LIPITOR) 20 MG tablet Take 1 tablet (20 mg total) by mouth once a week. 30 tablet 0  . Cholecalciferol (VITAMIN D) 1000 UNITS capsule Take 1,000 Units by mouth 2 (two) times daily.     . cyanocobalamin (,VITAMIN B-12,) 1000 MCG/ML injection Inject 1 mL (1,000 mcg total) into the muscle every 30  (thirty) days. Uses on amonthly basis 10 mL 1  . desvenlafaxine (PRISTIQ) 100 MG 24 hr tablet Take 1 tablet (100 mg total) by mouth daily. 90 tablet 0  . diclofenac (VOLTAREN) 75 MG EC tablet Take 1 tablet (75 mg total) by mouth 2 (two) times daily. 180 tablet 3  . FeFum-FePo-FA-B Cmp-C-Zn-Mn-Cu (TANDEM PLUS) 162-115.2-1 MG CAPS TAKE ONE CAPSULE BY MOUTH TWICE DAILY 180 each 2  . Levothyroxine Sodium (TIROSINT) 100 MCG CAPS Take 200 mcg by mouth daily before breakfast.     . Omega-3 Fatty Acids (FISH OIL) 1000 MG CAPS Take 1,000 mg by mouth 2 (two) times daily.     Marland Kitchen albuterol (PROVENTIL HFA;VENTOLIN HFA) 108 (90 BASE) MCG/ACT inhaler Inhale 1-2 puffs into the lungs every 4 (four) hours as needed for wheezing or shortness of breath. (Patient not taking: Reported on 02/08/2014) 1 Inhaler 0   Objective: BP 140/92 mmHg  Pulse 72  Temp(Src) 98.4 F (36.9 C)  Wt 234 lb (106.142 kg)  SpO2 95% Gen: NAD, resting comfortably HEENT: maxillary sinus tenderness L>R, no frontal sinus tenderness, turbinates swollen L>R (history of enlarged turbinate) with some yellow drainage, Some posterior pharynx erythema CV: RRR no murmurs rubs or gallops Lungs: CTAB no crackles, wheeze, rhonchi Abdomen: soft/nontender/nondistended/normal bowel sounds.  Ext: trace edema Skin: warm, dry   Assessment/Plan:  Sinusitis 20 days of sinus pressure and congestion worsening over weekend. May have started as simple URI exacerbating asthma when seen at urgent [name]. We discussed could be viral or bacterial. Offered patient prednisone course vs. Augmentin course  as technically meets criteria for bacterial sinusitis (>10 days of sinus pressure/congstion and worsening). Patient opts for prednisone trial and if no improvement with 7 days, treat with augmentin x 7 days. Return precautions advised.   S: Hypertension mild poor contro. was told needed to be on different BP med by endocrinologist, I presume talking acei or arb O: BP  elevated 140/82 A/P: Hypertension-discussed with patient with elevation though currently ill did not want to change medications. Advised follow up after over acute illness. If Bp remains over 140/90 would add lisinopril or losartan. Would not plan to swit   Meds ordered this encounter  Medications  . predniSONE (DELTASONE) 20 MG tablet    Sig: Take 2 tabs for 3 days, then 1 tab for 4 days then stop.    Dispense:  10 tablet    Refill:  0  . amLODipine (NORVASC) 10 MG tablet    Sig: Take 1 tablet (10 mg total) by mouth daily.    Dispense:  90 tablet    Refill:  3

## 2014-02-08 NOTE — Patient Instructions (Addendum)
Sinusitis-potentially viral vs. Bacterial. Opted to trial prednisone to calm things down for 7 days. If no relief by next week, call me and ill call in augmentin for you.   No baby aspirin for now with bypass history  Recheck blood pressure when not ill and at least a few weeks out from prednisone

## 2014-02-15 ENCOUNTER — Telehealth: Payer: Self-pay | Admitting: Family Medicine

## 2014-02-15 MED ORDER — AMOXICILLIN-POT CLAVULANATE 875-125 MG PO TABS: 1.0000 | ORAL_TABLET | Freq: Two times a day (BID) | ORAL | Status: DC

## 2014-02-15 NOTE — Telephone Encounter (Signed)
Please advise 

## 2014-02-15 NOTE — Telephone Encounter (Signed)
Pt.notified

## 2014-02-15 NOTE — Telephone Encounter (Signed)
augmentin called in. If symptoms persist or recur, schedule office visit

## 2014-02-15 NOTE — Telephone Encounter (Signed)
Patient states has finished predniSONE (DELTASONE) 20 MG tablet and isn't feeling any better.  She would like to know what to do next?

## 2014-04-20 ENCOUNTER — Encounter: Payer: Self-pay | Admitting: Family Medicine

## 2014-04-21 ENCOUNTER — Other Ambulatory Visit: Payer: Self-pay

## 2014-04-21 MED ORDER — CYANOCOBALAMIN 1000 MCG/ML IJ SOLN: 1000.0000 ug | INTRAMUSCULAR | Status: DC

## 2014-04-23 ENCOUNTER — Encounter: Payer: Self-pay | Admitting: Family Medicine

## 2014-04-28 ENCOUNTER — Telehealth: Payer: Self-pay | Admitting: Family Medicine

## 2014-04-28 NOTE — Telephone Encounter (Signed)
[employer] states the generic desvenlafaxine  Is denied.   PRISTIQ is the preferred drug and that is what is covered. Zigmund Daniel states she double checked and this is correct  They have lm for the pt.

## 2014-04-28 NOTE — Telephone Encounter (Signed)
May order branded version

## 2014-04-28 NOTE — Telephone Encounter (Signed)
Please see below.

## 2014-04-28 NOTE — Telephone Encounter (Signed)
See below

## 2014-04-29 MED ORDER — PRISTIQ 100 MG PO TB24: 100.0000 mg | ORAL_TABLET | Freq: Every day | ORAL | Status: DC

## 2014-04-29 NOTE — Telephone Encounter (Signed)
Branded version ordered

## 2014-07-06 ENCOUNTER — Ambulatory Visit: Payer: Medicare Other | Admitting: Family Medicine

## 2014-07-06 ENCOUNTER — Encounter: Payer: Medicare Other | Admitting: Family Medicine

## 2014-07-06 NOTE — Progress Notes (Signed)
Patient thought she was scheduled for 08/05/14. She is on vacation and out of town and unable to come in. Transferred to scheduling to reschedule.    This encounter was created in error - please disregard.

## 2014-07-20 ENCOUNTER — Ambulatory Visit (INDEPENDENT_AMBULATORY_CARE_PROVIDER_SITE_OTHER): Payer: Medicare Other | Admitting: Family Medicine

## 2014-07-20 ENCOUNTER — Encounter: Payer: Self-pay | Admitting: Family Medicine

## 2014-07-20 VITALS — BP 130/80 | HR 74 | Temp 98.4°F | Wt 237.0 lb

## 2014-07-20 DIAGNOSIS — J309 Allergic rhinitis, unspecified: Secondary | ICD-10-CM | POA: Insufficient documentation

## 2014-07-20 DIAGNOSIS — I10 Essential (primary) hypertension: Secondary | ICD-10-CM

## 2014-07-20 DIAGNOSIS — Z1211 Encounter for screening for malignant neoplasm of colon: Secondary | ICD-10-CM | POA: Diagnosis not present

## 2014-07-20 DIAGNOSIS — E119 Type 2 diabetes mellitus without complications: Secondary | ICD-10-CM

## 2014-07-20 LAB — HEMOGLOBIN A1C: Hgb A1c MFr Bld: 6.9 % — ABNORMAL HIGH (ref 4.6–6.5)

## 2014-07-20 NOTE — Progress Notes (Signed)
Garret Reddish, MD  Subjective:  [name] is a 73 y.o. year old very pleasant female patient who presents with:  Hypertension-controlled on amlodipine but with some edema  Breathing issue on ace inhibitor- placed in ICU many years ago BP Readings from Last 3 Encounters:  07/20/14 130/80  02/08/14 140/92  01/22/14 192/104  Compliant with medications-yes ROS-Denies any CP, HA, SOB, blurry vision  Diabetes mellitus II- diet controlled Lab Results  Component Value Date   HGBA1C 6.5 12/02/2013  wt essentially stable from January visit- up 1 lb Eating habits have worsened ROS- no hypoglycemia, polyuria, blurry vision  Loose stools for a week. Night before last.   Refer colonoscopy Dr. Barron Schmid.   Past Medical History- asthma, depression, hx gastric bypass, hypothyroid managed by endocrine  Medications- reviewed and updated Current Outpatient Prescriptions  Medication Sig Dispense Refill  . amLODipine (NORVASC) 10 MG tablet Take 1 tablet (10 mg total) by mouth daily. 90 tablet 3  . atorvastatin (LIPITOR) 20 MG tablet Take 1 tablet (20 mg total) by mouth once a week. 30 tablet 0  . Cholecalciferol (VITAMIN D) 1000 UNITS capsule Take 1,000 Units by mouth 2 (two) times daily.     . cyanocobalamin (,VITAMIN B-12,) 1000 MCG/ML injection Inject 1 mL (1,000 mcg total) into the muscle every 30 (thirty) days. Uses on amonthly basis 10 mL 2  . diclofenac (VOLTAREN) 75 MG EC tablet Take 1 tablet (75 mg total) by mouth 2 (two) times daily. 180 tablet 3  . FeFum-FePo-FA-B Cmp-C-Zn-Mn-Cu (TANDEM PLUS) 162-115.2-1 MG CAPS TAKE ONE CAPSULE BY MOUTH TWICE DAILY 180 each 2  . Levothyroxine Sodium (TIROSINT) 100 MCG CAPS Take 200 mcg by mouth daily before breakfast.     . Omega-3 Fatty Acids (FISH OIL) 1000 MG CAPS Take 1,000 mg by mouth 2 (two) times daily.     Marland Kitchen PRISTIQ 100 MG 24 hr tablet Take 1 tablet (100 mg total) by mouth daily. 30 tablet 6  . albuterol (PROVENTIL HFA;VENTOLIN HFA)  108 (90 BASE) MCG/ACT inhaler Inhale 1-2 puffs into the lungs every 4 (four) hours as needed for wheezing or shortness of breath. (Patient not taking: Reported on 02/08/2014) 1 Inhaler 0   Objective: BP 130/80 mmHg  Pulse 74  Temp(Src) 98.4 F (36.9 C)  Wt 237 lb (107.502 kg) Gen: NAD, resting comfortably CV: RRR no murmurs rubs or gallops Lungs: CTAB no crackles, wheeze, rhonchi Abdomen: soft/nontender/nondistended/normal bowel sounds. No rebound or guarding. Obese.  Ext: 1+ edema Skin: warm, dry Neuro: grossly normal, moves all extremities  Assessment/Plan:  Well controlled type 2 diabetes mellitus Check a1c. Patient suspicious a1c higher due to poor eating habits. If a1c >7, 3 month follow up. If <7, 6 month follow up. Regardless, encouraged healthy habits to be resumed.   Essential hypertension Controlled, continue amlodipine. Edema noted, but concerned about fatigue on beta blocker so continue amlodipine.   Cannot use:  HCTZ-hypercalcemia Breathing issue on ace inhibitor- placed in ICU many years ago. Also avoid ARB as result.    3-6 months depending on a1c.   Orders Placed This Encounter  Procedures  . Hemoglobin A1c  . Ambulatory referral to Gastroenterology    Referral Priority:  Routine    Referral Type:  Consultation    Referral Reason:  Specialty Services Required    Referred to Provider:  Clarene Essex, MD    Number of Visits Requested:  1

## 2014-07-20 NOTE — Assessment & Plan Note (Signed)
Check a1c. Patient suspicious a1c higher due to poor eating habits. If a1c >7, 3 month follow up. If <7, 6 month follow up. Regardless, encouraged healthy habits to be resumed.

## 2014-07-20 NOTE — Patient Instructions (Addendum)
We will call you within a week about your referral to Dr. Watt Climes for colonoscopy. If you do not hear within 2 weeks, give [place] a call.   Check in on a1c today. Let's tighten up the eating. Would love to have you exercising. Hopeful we can prevent being on medicine for your diabetes.   We ultimately decided to remain on amlodipine despite swelling due to potential side effects on other medicines and good blood pressure control.   Suspect that was the atypical summer stomach bug we have been seeing-glda you are doing better  Health Maintenance Due  Topic Date Due  . COLONOSCOPY  04/22/2014  . HEMOGLOBIN A1C  06/02/2014

## 2014-07-20 NOTE — Assessment & Plan Note (Signed)
Controlled, continue amlodipine. Edema noted, but concerned about fatigue on beta blocker so continue amlodipine.   Cannot use:  HCTZ-hypercalcemia Breathing issue on ace inhibitor- placed in ICU many years ago. Also avoid ARB as result.

## 2014-07-28 ENCOUNTER — Other Ambulatory Visit: Payer: Self-pay | Admitting: *Deleted

## 2014-07-28 MED ORDER — ATORVASTATIN CALCIUM 20 MG PO TABS: 20.0000 mg | ORAL_TABLET | ORAL | Status: DC

## 2014-08-19 ENCOUNTER — Encounter: Payer: Self-pay | Admitting: Family Medicine

## 2014-08-23 ENCOUNTER — Encounter: Payer: Self-pay | Admitting: Family Medicine

## 2014-08-25 NOTE — Telephone Encounter (Signed)
Keba enter referral please

## 2014-10-04 LAB — HM DIABETES EYE EXAM

## 2014-10-19 LAB — HM COLONOSCOPY: HM COLON: NORMAL

## 2014-10-21 ENCOUNTER — Encounter: Payer: Self-pay | Admitting: Family Medicine

## 2014-10-27 ENCOUNTER — Encounter: Payer: Self-pay | Admitting: Family Medicine

## 2014-10-28 ENCOUNTER — Ambulatory Visit (INDEPENDENT_AMBULATORY_CARE_PROVIDER_SITE_OTHER): Payer: Medicare Other

## 2014-10-28 DIAGNOSIS — Z23 Encounter for immunization: Secondary | ICD-10-CM

## 2014-11-02 ENCOUNTER — Other Ambulatory Visit: Payer: Self-pay

## 2014-11-02 MED ORDER — DICLOFENAC SODIUM 75 MG PO TBEC: 75.0000 mg | DELAYED_RELEASE_TABLET | Freq: Two times a day (BID) | ORAL | Status: DC

## 2014-11-07 ENCOUNTER — Other Ambulatory Visit: Payer: Self-pay

## 2014-11-09 ENCOUNTER — Encounter: Payer: Self-pay | Admitting: Family Medicine

## 2014-11-10 ENCOUNTER — Telehealth: Payer: Self-pay | Admitting: Family Medicine

## 2014-11-10 NOTE — Telephone Encounter (Signed)
[address] [place], [place] [phone]  Received faxed note from pharmacy stating:  At customer's request, she wants to switch to something else(doesn't want brand/generic)

## 2014-11-10 NOTE — Telephone Encounter (Signed)
Regarding which medication?

## 2014-11-11 NOTE — Telephone Encounter (Signed)
Fax did not state, please contact pharmacy to determine which medication.  thanks

## 2014-11-13 ENCOUNTER — Encounter: Payer: Self-pay | Admitting: Family Medicine

## 2014-11-14 ENCOUNTER — Other Ambulatory Visit: Payer: Self-pay

## 2014-11-14 MED ORDER — DICLOFENAC SODIUM 75 MG PO TBEC: 75.0000 mg | DELAYED_RELEASE_TABLET | Freq: Two times a day (BID) | ORAL | Status: DC

## 2014-11-14 NOTE — Telephone Encounter (Signed)
I contacted pharmacy to clarify this information. Patient wanted generic for Diclofenac sent to a different pharmacy if [facility] didn't have it, she wanted it sent to [facility]. [facility] did not carry this medication, so generic brand was sent to [facility]. Left message for patient to notify her of this.

## 2014-12-29 ENCOUNTER — Other Ambulatory Visit: Payer: Self-pay | Admitting: Family Medicine

## 2014-12-29 ENCOUNTER — Ambulatory Visit (INDEPENDENT_AMBULATORY_CARE_PROVIDER_SITE_OTHER): Payer: Medicare Other | Admitting: Family Medicine

## 2014-12-29 ENCOUNTER — Encounter: Payer: Self-pay | Admitting: Family Medicine

## 2014-12-29 VITALS — BP 130/74 | Temp 98.3°F | Wt 227.0 lb

## 2014-12-29 DIAGNOSIS — E119 Type 2 diabetes mellitus without complications: Secondary | ICD-10-CM | POA: Diagnosis not present

## 2014-12-29 DIAGNOSIS — E785 Hyperlipidemia, unspecified: Secondary | ICD-10-CM | POA: Diagnosis not present

## 2014-12-29 DIAGNOSIS — I10 Essential (primary) hypertension: Secondary | ICD-10-CM | POA: Diagnosis not present

## 2014-12-29 LAB — LIPID PANEL
CHOL/HDL RATIO: 5
CHOLESTEROL: 150 mg/dL (ref 0–200)
HDL: 32.4 mg/dL — ABNORMAL LOW (ref >39.00)
LDL CALC: 86 mg/dL (ref 0–99)
NonHDL: 117.45
Triglycerides: 158 mg/dL — ABNORMAL HIGH (ref 0.0–149.0)
VLDL: 31.6 mg/dL (ref 0.0–40.0)

## 2014-12-29 LAB — MICROALBUMIN / CREATININE URINE RATIO
CREATININE, U: 83.1 mg/dL
Microalb Creat Ratio: 0.8 mg/g (ref 0.0–30.0)
Microalb, Ur: <0.7 mg/dL (ref 0.0–1.9)

## 2014-12-29 LAB — COMPREHENSIVE METABOLIC PANEL
ALBUMIN: 3.8 g/dL (ref 3.5–5.2)
ALT: 17 U/L (ref 0–35)
AST: 20 U/L (ref 0–37)
Alkaline Phosphatase: 114 U/L (ref 39–117)
BUN: 14 mg/dL (ref 6–23)
CHLORIDE: 103 meq/L (ref 96–112)
CO2: 31 mEq/L (ref 19–32)
CREATININE: 0.96 mg/dL (ref 0.40–1.20)
Calcium: 11.4 mg/dL — ABNORMAL HIGH (ref 8.4–10.5)
GFR: 60.25 mL/min (ref >60.00)
Glucose, Bld: 110 mg/dL — ABNORMAL HIGH (ref 70–99)
Potassium: 4.8 mEq/L (ref 3.5–5.1)
SODIUM: 140 meq/L (ref 135–145)
Total Bilirubin: 0.6 mg/dL (ref 0.2–1.2)
Total Protein: 6.7 g/dL (ref 6.0–8.3)

## 2014-12-29 LAB — HEMOGLOBIN A1C: Hgb A1c MFr Bld: 6.6 % — ABNORMAL HIGH (ref 4.6–6.5)

## 2014-12-29 MED ORDER — ATORVASTATIN CALCIUM 20 MG PO TABS: 20.0000 mg | ORAL_TABLET | Freq: Every day | ORAL | Status: DC

## 2014-12-29 NOTE — Progress Notes (Signed)
Garret Reddish, MD  Subjective:  [name] is a 74 y.o. year old very pleasant female patient who presents for/with See problem oriented charting ROS- no hypoglycemia or blurry vision. No chest pain. No unintentional weight gain..  Past Medical History-  Patient Active Problem List   Diagnosis Date Noted  . Well controlled type 2 diabetes mellitus ([place]) 07/18/2006    Priority: High  . History of gastric bypass 02/08/2014    Priority: Medium  . Hyperlipidemia 01/05/2008    Priority: Medium  . Unspecified hypothyroidism 09/09/2007    Priority: Medium  . DEPRESSION 07/18/2006    Priority: Medium  . Essential hypertension 07/18/2006    Priority: Medium  . ASTHMA 07/18/2006    Priority: Medium  . Allergic rhinitis 07/20/2014    Priority: Low  . Osteoarthritis 10/05/2013    Priority: Low  . CHEST WALL PAIN, ANTERIOR 10/09/2007    Priority: Low    Medications- reviewed and updated Current Outpatient Prescriptions  Medication Sig Dispense Refill  . amLODipine (NORVASC) 10 MG tablet Take 1 tablet (10 mg total) by mouth daily. 90 tablet 3  . atorvastatin (LIPITOR) 20 MG tablet Take 1 tablet (20 mg total) by mouth daily at 6 PM. 30 tablet 1  . Cholecalciferol (VITAMIN D) 1000 UNITS capsule Take 1,000 Units by mouth 2 (two) times daily.     . cyanocobalamin (,VITAMIN B-12,) 1000 MCG/ML injection Inject 1 mL (1,000 mcg total) into the muscle every 30 (thirty) days. Uses on amonthly basis 10 mL 2  . diclofenac (VOLTAREN) 75 MG EC tablet Take 1 tablet (75 mg total) by mouth 2 (two) times daily. 180 tablet 3  . FeFum-FePo-FA-B Cmp-C-Zn-Mn-Cu (TANDEM PLUS) 162-115.2-1 MG CAPS TAKE ONE CAPSULE BY MOUTH TWICE DAILY 180 each 2  . Levothyroxine Sodium (TIROSINT) 100 MCG CAPS Take 200 mcg by mouth daily before breakfast.     . Omega-3 Fatty Acids (FISH OIL) 1000 MG CAPS Take 1,000 mg by mouth 2 (two) times daily.     Marland Kitchen PRISTIQ 100 MG 24 hr tablet Take 1 tablet (100 mg total) by mouth  daily. 30 tablet 6  . albuterol (PROVENTIL HFA;VENTOLIN HFA) 108 (90 BASE) MCG/ACT inhaler Inhale 1-2 puffs into the lungs every 4 (four) hours as needed for wheezing or shortness of breath. (Patient not taking: Reported on 02/08/2014) 1 Inhaler 0   No current facility-administered medications for this visit.    Objective: BP 130/74 mmHg  Temp(Src) 98.3 F (36.8 C)  Wt 227 lb (102.967 kg) Gen: NAD, resting comfortably CV: RRR no murmurs rubs or gallops Lungs: CTAB no crackles, wheeze, rhonchi Abdomen: soft/nontender/nondistended/normal bowel sounds. No rebound or guarding.  Ext: Trace to 1+ edema Skin: warm, dry Neuro: grossly normal, moves all extremities  Diabetic Foot Exam - Simple   Simple Foot Form  Visual Inspection  See comments:  Yes  Sensation Testing  Intact to touch and monofilament testing bilaterally:  Yes  Pulse Check  Posterior Tibialis and Dorsalis pulse intact bilaterally:  Yes  See comments:  Yes  Comments  1+ pulses, callus- gets trimmed down when gets nails done     Assessment/Plan:  Well controlled type 2 diabetes mellitus S: Well controlled. On diet alone. Little exercise.  Lab Results  Component Value Date   HGBA1C 6.6* 12/29/2014   HGBA1C 6.9* 07/20/2014   HGBA1C 6.5 12/02/2013   A/P: Congratulated patient on 10 pound weight loss through [employer]. Continue current efforts and remain off medication.  Hyperlipidemia S: well controlled on atorvastatin atorvastatin 20mg  once a week. Still has myalgias.  Lab Results  Component Value Date   CHOL 150 12/29/2014   HDL 32.40* 12/29/2014   LDLCALC 86 12/29/2014   LDLDIRECT 119.2 01/04/2014   TRIG 158.0* 12/29/2014   CHOLHDL 5 12/29/2014   A/P: wrote rx as daily- she can certainly try to push dose to try to get LDL <70 to further lower risk but may not be able to tolerate    Essential hypertension S: controlled. On amlodipine 10 mg alone. BP Readings from Last 3 Encounters:   12/29/14 130/74  07/20/14 130/80  02/08/14 140/92  A/P:Continue current meds:  Her endocrinologist wants her on an angiotensin receptor blocker. I'm concerned about the fact she was told by anesthesia never to use an ACE inhibitor and apparently ended up in the ICU at 1 point. She is going to discuss her concerns with endocrinology.   Hypercalcemia S: calcium came back >11. Asymptomatic. Not on supplements- called patient.  A/P: We will get ionized calcium, vit D, intact PTH. She is plugged in with endocrine and I asked patient to get their opinion when she sees them in January.     no follow-up written.  consider every 3-6 months. Return precautions advised.   Orders Placed This Encounter  Procedures  . Microalbumin/Creatinine Ratio, Urine  . Hemoglobin A1c    [place]  . Comprehensive metabolic panel    [place]    Order Specific Question:  Has the patient fasted?    Answer:  No  . Lipid panel        Order Specific Question:  Has the patient fasted?    Answer:  No    Meds ordered this encounter  Medications  . atorvastatin (LIPITOR) 20 MG tablet    Sig: Take 1 tablet (20 mg total) by mouth daily at 6 PM.    Dispense:  30 tablet    Refill:  1   Health Maintenance Due  Topic Date Due  . FOOT EXAM - today 12/03/2014  . URINE MICROALBUMIN - today normal 12/03/2014

## 2014-12-29 NOTE — Assessment & Plan Note (Signed)
S: well controlled on atorvastatin atorvastatin 20mg  once a week. Still has myalgias.  Lab Results  Component Value Date   CHOL 150 12/29/2014   HDL 32.40* 12/29/2014   LDLCALC 86 12/29/2014   LDLDIRECT 119.2 01/04/2014   TRIG 158.0* 12/29/2014   CHOLHDL 5 12/29/2014   A/P: wrote rx as daily- she can certainly try to push dose to try to get LDL <70 to further lower risk but may not be able to tolerate

## 2014-12-29 NOTE — Patient Instructions (Addendum)
Labs before you leave  Please discuss with your endocrinologist my concern about adding an ARB. In 2011 the chart listed an ace inhibitor as an allergy. You told me you were told this by anesthesia and there was some ICU related issue with this. I have been hesitant to add an ARB given this prior unknown allergy. I think it would be a good choice though considering some home readings in the 140s and your readings at his office higher than that.  BP Readings from Last 3 Encounters:  12/29/14 130/74  07/20/14 130/80  02/08/14 140/92  Here, your blood pressure hasdone well on 2/3 last measures.   Increase atorvastatin as tolerated to daily. If not tolerated, can remain on once a week.

## 2014-12-29 NOTE — Assessment & Plan Note (Signed)
S: calcium came back >11. Asymptomatic. Not on supplements- called patient.  A/P: We will get ionized calcium, vit D, intact PTH. She is plugged in with endocrine and I asked patient to get their opinion when she sees them in January.

## 2014-12-29 NOTE — Assessment & Plan Note (Signed)
S: controlled. On amlodipine 10 mg alone. BP Readings from Last 3 Encounters:  12/29/14 130/74  07/20/14 130/80  02/08/14 140/92  A/P:Continue current meds:  Her endocrinologist wants her on an angiotensin receptor blocker. I'm concerned about the fact she was told by anesthesia never to use an ACE inhibitor and apparently ended up in the ICU at 1 point. She is going to discuss her concerns with endocrinology.

## 2014-12-29 NOTE — Assessment & Plan Note (Signed)
S: Well controlled. On diet alone. Little exercise.  Lab Results  Component Value Date   HGBA1C 6.6* 12/29/2014   HGBA1C 6.9* 07/20/2014   HGBA1C 6.5 12/02/2013   A/P: Congratulated patient on 10 pound weight loss through [employer]. Continue current efforts and remain off medication.

## 2014-12-30 ENCOUNTER — Other Ambulatory Visit (INDEPENDENT_AMBULATORY_CARE_PROVIDER_SITE_OTHER): Payer: Medicare Other

## 2014-12-30 ENCOUNTER — Telehealth: Payer: Self-pay

## 2014-12-30 LAB — VITAMIN D 25 HYDROXY (VIT D DEFICIENCY, FRACTURES): VITD: 43.94 ng/mL (ref 30.00–100.00)

## 2014-12-30 NOTE — Telephone Encounter (Signed)
Pt.notified

## 2014-12-30 NOTE — Telephone Encounter (Signed)
Pt came by and states she has been taking TUMS with calcium carbonate quite frequently and she wonders if this could be the cause of her calcium being elevated?

## 2014-12-30 NOTE — Telephone Encounter (Signed)
Could be related- have her hold off and then lets repeat in 2-3 weeks

## 2014-12-31 LAB — CALCIUM, IONIZED: Calcium, Ion: 1.39 mmol/L — ABNORMAL HIGH (ref 1.12–1.32)

## 2015-01-02 LAB — PTH, INTACT AND CALCIUM
CALCIUM: 10.1 mg/dL (ref 8.4–10.5)
PTH: 83 pg/mL — AB (ref 14–64)

## 2015-02-01 ENCOUNTER — Other Ambulatory Visit: Payer: Self-pay | Admitting: Family Medicine

## 2015-02-02 ENCOUNTER — Encounter: Payer: Self-pay | Admitting: Family Medicine

## 2015-02-02 ENCOUNTER — Other Ambulatory Visit: Payer: Self-pay

## 2015-02-02 MED ORDER — TANDEM PLUS 162-115.2-1 MG PO CAPS: ORAL_CAPSULE | ORAL | Status: DC

## 2015-02-03 ENCOUNTER — Encounter: Payer: Self-pay | Admitting: Family Medicine

## 2015-02-03 ENCOUNTER — Other Ambulatory Visit: Payer: Self-pay | Admitting: Family Medicine

## 2015-02-03 DIAGNOSIS — E213 Hyperparathyroidism, unspecified: Secondary | ICD-10-CM

## 2015-02-03 NOTE — Telephone Encounter (Signed)
You may place referral for her for Dr. Elyse Hsu (endocrinology)- not sure which location he is at. i do not have a prior bone density- ask patient if she wants [place] to order one? Or see if it can be done at his office.

## 2015-02-06 ENCOUNTER — Other Ambulatory Visit: Payer: Self-pay | Admitting: Family Medicine

## 2015-02-06 DIAGNOSIS — Z780 Asymptomatic menopausal state: Secondary | ICD-10-CM

## 2015-02-09 ENCOUNTER — Telehealth: Payer: Self-pay | Admitting: Family Medicine

## 2015-02-09 NOTE — Telephone Encounter (Signed)
[name]- please see last message from [facility] which you were a part of then call and speak to patient to clarify. You ordered the bone density because we did not have copy of a prior one

## 2015-02-09 NOTE — Telephone Encounter (Signed)
Did you  Mean to refer pt to ENDO? Didn't see anything documented, was this entered in error?

## 2015-02-09 NOTE — Telephone Encounter (Signed)
Pt does not want to set up bone density test until see talk with keba about endocrinologist

## 2015-02-10 ENCOUNTER — Other Ambulatory Visit: Payer: Self-pay | Admitting: Family Medicine

## 2015-02-10 DIAGNOSIS — E213 Hyperparathyroidism, unspecified: Secondary | ICD-10-CM

## 2015-02-10 NOTE — Telephone Encounter (Signed)
Spoke with pt and she states someone from [facility] called and she sees Dr. Elyse Hsu at [facility]. Pt transferred to front desk to schedule bone density. Referral re-entered to go to [facility] to whom she is already est. With.

## 2015-02-14 ENCOUNTER — Ambulatory Visit (INDEPENDENT_AMBULATORY_CARE_PROVIDER_SITE_OTHER)
Admission: RE | Admit: 2015-02-14 | Discharge: 2015-02-14 | Disposition: A | Discharge status: Home / Self Care | Payer: Medicare Other | Source: Ambulatory Visit | Attending: Family Medicine | Admitting: Family Medicine

## 2015-02-14 ENCOUNTER — Telehealth: Payer: Self-pay | Admitting: Family Medicine

## 2015-02-14 DIAGNOSIS — Z780 Asymptomatic menopausal state: Secondary | ICD-10-CM | POA: Diagnosis not present

## 2015-02-14 NOTE — Telephone Encounter (Signed)
Pt had bone density test today and would like the results as soon as available,

## 2015-02-15 ENCOUNTER — Other Ambulatory Visit: Payer: Self-pay

## 2015-02-15 DIAGNOSIS — J4521 Mild intermittent asthma with (acute) exacerbation: Secondary | ICD-10-CM

## 2015-02-15 MED ORDER — ALBUTEROL SULFATE HFA 108 (90 BASE) MCG/ACT IN AERS: 1.0000 | INHALATION_SPRAY | RESPIRATORY_TRACT | Status: DC | PRN

## 2015-02-15 MED ORDER — TANDEM PLUS 162-115.2-1 MG PO CAPS: ORAL_CAPSULE | ORAL | Status: DC

## 2015-02-15 NOTE — Telephone Encounter (Signed)
Called and notified pt that we will call her once results have been reviewed by Dr. Yong Channel

## 2015-03-02 ENCOUNTER — Ambulatory Visit (INDEPENDENT_AMBULATORY_CARE_PROVIDER_SITE_OTHER): Payer: Medicare Other

## 2015-03-02 ENCOUNTER — Ambulatory Visit (INDEPENDENT_AMBULATORY_CARE_PROVIDER_SITE_OTHER): Payer: Medicare Other | Admitting: Family Medicine

## 2015-03-02 VITALS — BP 144/90 | HR 74 | Temp 98.2°F | Resp 20 | Ht 62.0 in | Wt 226.8 lb

## 2015-03-02 DIAGNOSIS — M25551 Pain in right hip: Secondary | ICD-10-CM

## 2015-03-02 MED ORDER — TRAMADOL HCL 50 MG PO TABS: 50.0000 mg | ORAL_TABLET | Freq: Three times a day (TID) | ORAL | Status: DC | PRN

## 2015-03-02 NOTE — Progress Notes (Signed)
By signing my name below, I, Rawaa Al Rifaie, attest that this documentation has been prepared under the direction and in the presence of Robyn Haber, [place], Medical Scribe. 03/02/2015.  1:17 PM .  Patient ID: [name] MRN: [id number], DOB: 1940-04-01, 75 y.o. Date of Encounter: 03/02/2015  Primary Physician: Garret Reddish, MD  Chief Complaint:  Chief Complaint  Patient presents with  . Hip Injury    fell today , have a hematoma right side    HPI:  [name] is a 75 y.o. female who presents to [facility] complaining of a right hip injury secondary to a fall that occurred today.  Pt reports that she tripped on a curb and fell on a cemented floor and landed on her right side of the body. She was ambulatory on scene, and notes that it is difficult to sit or put pressure on the area. She has associated hematoma on the area. Pt also presents with an ecchymosis on the thump of the right hand, and a right knee pain. She states that she takes aspirin daily. Pt has a history of hyperthyroidism for which she follows up with a specialist.    Past Medical History  Diagnosis Date  . Asthma   . Depression   . Vitamin B 12 deficiency   . Anemia   . Thyroid disease   . Hypothyroidism   . Sleep difficulties     had sleep study -2009, prior to gastric surgery, told that there was not a need for f/u  . Arthritis     osteoarthritis - shoulder, knees & hips  . Hypertension     Dr. Orinda Kenner manages BP, pt. reports MD has not found a need for treatment   . Diabetes mellitus without complication ([place])     no meds     Home Meds: Prior to Admission medications   Medication Sig Start Date End Date Taking? Authorizing Provider  aspirin 81 MG tablet Take 81 mg by mouth daily.   Yes Historical Provider, MD  albuterol (PROVENTIL HFA;VENTOLIN HFA) 108 (90 Base) MCG/ACT inhaler Inhale 1-2 puffs into the lungs every 4 (four) hours as needed for  wheezing or shortness of breath. Patient not taking: Reported on 03/02/2015 02/15/15   Marin Olp, MD  amLODipine (NORVASC) 10 MG tablet Take 1 tablet (10 mg total) by mouth daily. 02/08/14   Marin Olp, MD  atorvastatin (LIPITOR) 20 MG tablet Take 1 tablet (20 mg total) by mouth daily at 6 PM. 12/29/14   Marin Olp, MD  Cholecalciferol (VITAMIN D) 1000 UNITS capsule Take 1,000 Units by mouth 2 (two) times daily.     Historical Provider, MD  cyanocobalamin (,VITAMIN B-12,) 1000 MCG/ML injection Inject 1 mL (1,000 mcg total) into the muscle every 30 (thirty) days. Uses on amonthly basis 04/21/14   Marin Olp, MD  diclofenac (VOLTAREN) 75 MG EC tablet Take 1 tablet (75 mg total) by mouth 2 (two) times daily. 11/14/14   Marin Olp, MD  FeFum-FePo-FA-B Cmp-C-Zn-Mn-Cu (TANDEM PLUS) [phone] MG CAPS TAKE ONE CAPSULE BY MOUTH TWICE DAILY 02/15/15   Marin Olp, MD  Levothyroxine Sodium (TIROSINT) 100 MCG CAPS Take 200 mcg by mouth daily before breakfast.     Historical Provider, MD  Omega-3 Fatty Acids (FISH OIL) 1000 MG CAPS Take 1,000 mg by mouth 2 (two) times daily.     Historical Provider, MD  PRISTIQ 100 MG  24 hr tablet TAKE 1 BY MOUTH DAILY 02/01/15   Marin Olp, MD    Allergies:  Allergies  Allergen Reactions  . Flexeril [Cyclobenzaprine]     On Pristiq  With possible serotonin reaction to flexeril  . Ace Inhibitors     States after a surgery she was told this. Not sure what the reason was.   . Azithromycin     REACTION: Rash  . Codeine     REACTION: Upset stomach    Social History   Social History  . Marital Status: Widowed    Spouse Name: N/A  . Number of Children: N/A  . Years of Education: N/A   Occupational History  . retired Marine scientist    Social History Main Topics  . Smoking status: Former Smoker    Quit date: 03/04/1978  . Smokeless tobacco: Not on file  . Alcohol Use: 1.2 oz/week    2 Glasses of wine per week  . Drug Use: No  .  Sexual Activity: Not Currently   Other Topics Concern  . Not on file   Social History Narrative   Widowed 2013. 2 kids. 4 grandkids. Oldest daughter lives in [place] and has 2 grandkids that are now in Scientist, physiological, PT school at [employer].       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 Positive for color change.  Neurologic: negative for headache, dizziness, or syncope Msk: positive for arthralgia, myalgia. All other systems reviewed and are otherwise negative with the exception to those above and in the HPI.  Physical Exam: Blood pressure 144/90, pulse 74, temperature 98.2 F (36.8 C), temperature source Oral, resp. rate 20, height 5\' 2"  (1.575 m), weight 226 lb 12.8 oz (102.876 kg), SpO2 97 %., Body mass index is 41.47 kg/(m^2). General: Well developed, well nourished, in no acute distress. Head: Normocephalic, atraumatic, eyes without discharge, sclera non-icteric, nares are without discharge. Bilateral auditory canals clear, TM's are without perforation, pearly grey and translucent with reflective cone of light bilaterally. Oral cavity moist, posterior pharynx without exudate, erythema, peritonsillar abscess, or post nasal drip.  Neck: Supple. No thyromegaly. Full ROM. No lymphadenopathy. Lungs: Clear bilaterally to auscultation without wheezes, rales, or rhonchi. Breathing is unlabored. Heart: RRR with S1 S2. No murmurs, rubs, or  gallops appreciated. Abdomen: Soft, non-tender, non-distended with normoactive bowel sounds. No hepatomegaly. No rebound/guarding. No obvious abdominal masses. Msk:  Strength and tone normal for age. Extremities/Skin: Warm and dry. No clubbing or cyanosis. No edema. No rashes or suspicious lesions. Black and blue over the base of the right thump. 15 cm round area swelling just below trochantea on the right hip.  Neuro: Alert and oriented X 3. Moves all extremities spontaneously. Gait is normal. CNII-XII grossly in tact. Psych:  Responds to questions appropriately with a normal affect.   X-ray:   No fracture seen on right hip film  ASSESSMENT AND PLAN:  75 y.o. year old female with fall resulting in large contusion with swelling and no fracture of right hip  area  Your fall at [employer] today resulted in a large hematoma on her right hip with the contusion. You'll be stiff and sore for about 10 days but we expect complete healing. I'm writing out a prescription for tramadol for pain and want you to continue using ice to reduce his swelling over the next 24 hours.  This chart was scribed in my presence and reviewed by me personally.    ICD-9-CM ICD-10-CM   1. Right hip pain 719.45 M25.551 DG HIP UNILAT W OR W/O PELVIS 2-3 VIEWS RIGHT     traMADol (ULTRAM) 50 MG tablet     Signed, Robyn Haber, MD  Signed, Robyn Haber, MD 03/02/2015 1:10 PM

## 2015-03-02 NOTE — Patient Instructions (Addendum)
Because you received an x-ray today, you will receive an invoice from [facility]. Please contact [facility] at [phone] with questions or concerns regarding your invoice. Our billing staff will not be able to assist you with those questions.  Your fall at [employer] today resulted in a large hematoma on her right hip with the contusion. You'll be stiff and sore for about 10 days but we expect complete healing. I'm writing out a prescription for tramadol for pain and want you to continue using ice to reduce his swelling over the next 24 hours.

## 2015-03-03 ENCOUNTER — Other Ambulatory Visit: Payer: Self-pay | Admitting: Family Medicine

## 2015-03-07 ENCOUNTER — Other Ambulatory Visit: Payer: Self-pay | Admitting: Family Medicine

## 2015-04-12 ENCOUNTER — Encounter: Payer: Self-pay | Admitting: Family Medicine

## 2015-04-14 ENCOUNTER — Encounter: Payer: Self-pay | Admitting: Family Medicine

## 2015-04-23 ENCOUNTER — Ambulatory Visit (INDEPENDENT_AMBULATORY_CARE_PROVIDER_SITE_OTHER): Payer: Medicare Other | Admitting: Family Medicine

## 2015-04-23 VITALS — BP 114/70 | HR 85 | Temp 98.4°F | Resp 17 | Ht 61.5 in | Wt 223.0 lb

## 2015-04-23 DIAGNOSIS — J029 Acute pharyngitis, unspecified: Secondary | ICD-10-CM

## 2015-04-23 DIAGNOSIS — H9203 Otalgia, bilateral: Secondary | ICD-10-CM

## 2015-04-23 DIAGNOSIS — J069 Acute upper respiratory infection, unspecified: Secondary | ICD-10-CM | POA: Diagnosis not present

## 2015-04-23 MED ORDER — HYDROCODONE-HOMATROPINE 5-1.5 MG/5ML PO SYRP: 5.0000 mL | ORAL_SOLUTION | ORAL | Status: DC | PRN

## 2015-04-23 MED ORDER — BENZONATATE 100 MG PO CAPS: 100.0000 mg | ORAL_CAPSULE | Freq: Three times a day (TID) | ORAL | Status: DC | PRN

## 2015-04-23 NOTE — Progress Notes (Signed)
Patient ID: CRYSTELLE LANGS, female    DOB: [birth year]  Age: 75 y.o. MRN: [id number]  Chief Complaint  Patient presents with  . Cough  . URI  . Sore Throat  . Ear Pain  . Headache    Subjective:   Pleasant lady 75 years old who is here having been feeling bad for the last 3 days or so with multiple upper respiratory symptoms. She has had sinus discomfort. Her ears have been hurting. She has had a sore throat, which feels like she did when she had a strep throat in the past. She has not had a documented fever that she has had some chills at night. She is coughing, nonproductive. She has taken some Tylenol for the headache and generalized malaise. She does have some Flonase but she is not use that. She is allergic to codeine. She lives alone but does do some volunteering with her church that gets her out where she might be exposed to things. She does have a history of allergies.  Current allergies, medications, problem list, past/family and social histories reviewed.  Objective:  BP 114/70 mmHg  Pulse 85  Temp(Src) 98.4 F (36.9 C) (Oral)  Resp 17  Ht 5' 1.5" (1.562 m)  Wt 223 lb (101.152 kg)  BMI 41.46 kg/m2  SpO2 95%  TMs are normal. Nose not severely congested. Throat clear without major erythema or edema. Neck was supple without anterior cervical nodes. Chest is clear to auscultation. Heart regular without any murmurs. No rhonchi, rales, or wheezes. Abdomen nontender.  Assessment & Plan:   Assessment: 1. Acute upper respiratory infection   2. Otalgia of both ears   3. Sore throat       Plan: This is a viral upper respiratory infection. Symptoms are not consistent with strep. It is past the window of opportunity to be beneficial to treat flu. However I do think this is probably a non-flu virus. See instructions.  No orders of the defined types were placed in this encounter.    No orders of the defined types were placed in this encounter.         Patient  Instructions   Drink plenty of fluids and get enough rest  Use the Flonase ((fluticasone) nose spray 2 sprays each nostril twice daily for 3 days, then decrease to the regular once daily. This should help keep your sinuses open and open up the eustachian tubes to your ears which would give some relief there.  Take over-the-counter Mucinex (guaifenesin) for thinning of the mucus secretions. I would recommend either the plain Mucinex or the Mucinex cough and cold.  Take the benzonatate cough pills one or 2 pills 3 times daily as needed for daytime cough  Take the Hycodan cough syrup 1 teaspoon every 6 or 8 hours if needed for nighttime cough. This will tend to make you drowsy.  If you're getting worse or bringing up a lot of purulent mucus please contact us or return to get rechecked.    IF you received an x-ray today, you will receive an invoice from [facility]. Please contact [facility] at [phone] with questions or concerns regarding your invoice.   IF you received labwork today, you will receive an invoice from [employer]. Please contact [facility] at [phone] with questions or concerns regarding your invoice.   Our billing staff will not be able to assist you with questions regarding bills from these companies.  You will be contacted with the lab results  as soon as they are available. The fastest way to get your results is to activate your My Chart account. Instructions are located on the last page of this paperwork. If you have not heard from [place] regarding the results in 2 weeks, please contact this office.          Return if symptoms worsen or fail to improve.   HOPPER,DAVID, MD 04/23/2015

## 2015-04-23 NOTE — Patient Instructions (Addendum)
Drink plenty of fluids and get enough rest  Use the Flonase ((fluticasone) nose spray 2 sprays each nostril twice daily for 3 days, then decrease to the regular once daily. This should help keep your sinuses open and open up the eustachian tubes to your ears which would give some relief there.  Take over-the-counter Mucinex (guaifenesin) for thinning of the mucus secretions. I would recommend either the plain Mucinex or the Mucinex cough and cold.  Take the benzonatate cough pills one or 2 pills 3 times daily as needed for daytime cough  Take the Hycodan cough syrup 1 teaspoon every 6 or 8 hours if needed for nighttime cough. This will tend to make you drowsy.  If you're getting worse or bringing up a lot of purulent mucus please contact us or return to get rechecked.  Upper Respiratory Infection, Adult Most upper respiratory infections (URIs) are a viral infection of the air passages leading to the lungs. A URI affects the nose, throat, and upper air passages. The most common type of URI is nasopharyngitis and is typically referred to as "the common cold." URIs run their course and usually go away on their own. Most of the time, a URI does not require medical attention, but sometimes a bacterial infection in the upper airways can follow a viral infection. This is called a secondary infection. Sinus and middle ear infections are common types of secondary upper respiratory infections. Bacterial pneumonia can also complicate a URI. A URI can worsen asthma and chronic obstructive pulmonary disease (COPD). Sometimes, these complications can require emergency medical care and may be life threatening.  CAUSES Almost all URIs are caused by viruses. A virus is a type of germ and can spread from one person to another.  RISKS FACTORS You may be at risk for a URI if:   You smoke.   You have chronic heart or lung disease.  You have a weakened defense (immune) system.   You are very young or very  old.   You have nasal allergies or asthma.  You work in crowded or poorly ventilated areas.  You work in health care facilities or schools. SIGNS AND SYMPTOMS  Symptoms typically develop 2-3 days after you come in contact with a cold virus. Most viral URIs last 7-10 days. However, viral URIs from the influenza virus (flu virus) can last 14-18 days and are typically more severe. Symptoms may include:   Runny or stuffy (congested) nose.   Sneezing.   Cough.   Sore throat.   Headache.   Fatigue.   Fever.   Loss of appetite.   Pain in your forehead, behind your eyes, and over your cheekbones (sinus pain).  Muscle aches.  DIAGNOSIS  Your health care provider may diagnose a URI by:  Physical exam.  Tests to check that your symptoms are not due to another condition such as:  Strep throat.  Sinusitis.  Pneumonia.  Asthma. TREATMENT  A URI goes away on its own with time. It cannot be cured with medicines, but medicines may be prescribed or recommended to relieve symptoms. Medicines may help:  Reduce your fever.  Reduce your cough.  Relieve nasal congestion. HOME CARE INSTRUCTIONS   Take medicines only as directed by your health care provider.   Gargle warm saltwater or take cough drops to comfort your throat as directed by your health care provider.  Use a warm mist humidifier or inhale steam from a shower to increase air moisture. This may make it easier  to breathe.  Drink enough fluid to keep your urine clear or pale yellow.   Eat soups and other clear broths and maintain good nutrition.   Rest as needed.   Return to work when your temperature has returned to normal or as your health care provider advises. You may need to stay home longer to avoid infecting others. You can also use a face mask and careful hand washing to prevent spread of the virus.  Increase the usage of your inhaler if you have asthma.   Do not use any tobacco products,  including cigarettes, chewing tobacco, or electronic cigarettes. If you need help quitting, ask your health care provider. PREVENTION  The best way to protect yourself from getting a cold is to practice good hygiene.   Avoid oral or hand contact with people with cold symptoms.   Wash your hands often if contact occurs.  There is no clear evidence that vitamin C, vitamin E, echinacea, or exercise reduces the chance of developing a cold. However, it is always recommended to get plenty of rest, exercise, and practice good nutrition.  SEEK MEDICAL CARE IF:   You are getting worse rather than better.   Your symptoms are not controlled by medicine.   You have chills.  You have worsening shortness of breath.  You have brown or red mucus.  You have yellow or brown nasal discharge.  You have pain in your face, especially when you bend forward.  You have a fever.  You have swollen neck glands.  You have pain while swallowing.  You have white areas in the back of your throat. SEEK IMMEDIATE MEDICAL CARE IF:   You have severe or persistent:  Headache.  Ear pain.  Sinus pain.  Chest pain.  You have chronic lung disease and any of the following:  Wheezing.  Prolonged cough.  Coughing up blood.  A change in your usual mucus.  You have a stiff neck.  You have changes in your:  Vision.  Hearing.  Thinking.  Mood. MAKE SURE YOU:   Understand these instructions.  Will watch your condition.  Will get help right away if you are not doing well or get worse.   This information is not intended to replace advice given to you by your health care provider. Make sure you discuss any questions you have with your health care provider.   Document Released: 07/03/2000 Document Revised: 05/24/2014 Document Reviewed: 04/14/2013 Elsevier Interactive Patient Education 2016 [employer].   IF you received an x-ray today, you will receive an invoice from [facility]. Please contact [facility] at [phone] with questions or concerns regarding your invoice.   IF you received labwork today, you will receive an invoice from [employer]. Please contact [facility] at [phone] with questions or concerns regarding your invoice.   Our billing staff will not be able to assist you with questions regarding bills from these companies.  You will be contacted with the lab results as soon as they are available. The fastest way to get your results is to activate your My Chart account. Instructions are located on the last page of this paperwork. If you have not heard from [place] regarding the results in 2 weeks, please contact this office.

## 2015-04-25 ENCOUNTER — Encounter: Payer: Self-pay | Admitting: Family Medicine

## 2015-04-27 ENCOUNTER — Ambulatory Visit (INDEPENDENT_AMBULATORY_CARE_PROVIDER_SITE_OTHER): Payer: Medicare Other | Admitting: Family Medicine

## 2015-04-27 ENCOUNTER — Encounter: Payer: Self-pay | Admitting: Family Medicine

## 2015-04-27 VITALS — BP 124/82 | HR 98 | Temp 98.5°F | Wt 221.0 lb

## 2015-04-27 DIAGNOSIS — R05 Cough: Secondary | ICD-10-CM | POA: Diagnosis not present

## 2015-04-27 DIAGNOSIS — J0100 Acute maxillary sinusitis, unspecified: Secondary | ICD-10-CM | POA: Diagnosis not present

## 2015-04-27 DIAGNOSIS — R059 Cough, unspecified: Secondary | ICD-10-CM

## 2015-04-27 MED ORDER — PRISTIQ 100 MG PO TB24: ORAL_TABLET | ORAL | Status: DC

## 2015-04-27 MED ORDER — AMOXICILLIN-POT CLAVULANATE 875-125 MG PO TABS: 1.0000 | ORAL_TABLET | Freq: Two times a day (BID) | ORAL | Status: DC

## 2015-04-27 NOTE — Progress Notes (Signed)
PCP: Garret Reddish, MD  Subjective:  [name] is a 75 y.o. year old very pleasant female patient who presents with sinusitis symptoms including nasal congestion, sinus tenderness, chest congestion, bilateral ear pain.  Seen in urgent care 04/23/15- told URI and treated with-Tessalon, hycodan, mucinex, flonase. Head, chest, ears. Throat improving. Productive cough with thick brown mucus. Does feel sinsus pain maxillary and frontal. Day 9 of symptoms- minimal improvement.  -sick contacts/travel/risks: denies flu exposure. In addition, outside window for tamiflu efficacy  ROS-denies fever, SOB, NVD.   Pertinent Past Medical History-  Patient Active Problem List   Diagnosis Date Noted  . Hypercalcemia 12/29/2014    Priority: High  . Well controlled type 2 diabetes mellitus ([place]) 07/18/2006    Priority: High  . History of gastric bypass 02/08/2014    Priority: Medium  . Hyperlipidemia 01/05/2008    Priority: Medium  . Unspecified hypothyroidism 09/09/2007    Priority: Medium  . Depression 07/18/2006    Priority: Medium  . Essential hypertension 07/18/2006    Priority: Medium  . ASTHMA 07/18/2006    Priority: Medium  . Allergic rhinitis 07/20/2014    Priority: Low  . Osteoarthritis 10/05/2013    Priority: Low  . CHEST WALL PAIN, ANTERIOR 10/09/2007    Priority: Low    Medications- reviewed  Current Outpatient Prescriptions  Medication Sig Dispense Refill  . amLODipine (NORVASC) 10 MG tablet TAKE ONE TABLET BY MOUTH ONCE DAILY 90 tablet 0  . aspirin 81 MG tablet Take 81 mg by mouth daily.    . Cholecalciferol (VITAMIN D) 1000 UNITS capsule Take 1,000 Units by mouth 2 (two) times daily.     . cyanocobalamin (,VITAMIN B-12,) 1000 MCG/ML injection Inject 1 mL (1,000 mcg total) into the muscle every 30 (thirty) days. Uses on amonthly basis 10 mL 2  . diclofenac (VOLTAREN) 75 MG EC tablet Take 1 tablet (75 mg total) by mouth 2 (two) times daily. 180 tablet 3  .  FeFum-FePo-FA-B Cmp-C-Zn-Mn-Cu (TANDEM PLUS) 162-115.2-1 MG CAPS TAKE ONE CAPSULE BY MOUTH TWICE DAILY 180 each 3  . Levothyroxine Sodium (TIROSINT) 100 MCG CAPS Take 200 mcg by mouth daily before breakfast.     . Omega-3 Fatty Acids (FISH OIL) 1000 MG CAPS Take 1,000 mg by mouth 2 (two) times daily.     Marland Kitchen PRISTIQ 100 MG 24 hr tablet TAKE 1 BY MOUTH DAILY 90 tablet 3  . albuterol (PROVENTIL HFA;VENTOLIN HFA) 108 (90 Base) MCG/ACT inhaler Inhale 1-2 puffs into the lungs every 4 (four) hours as needed for wheezing or shortness of breath. (Patient not taking: Reported on 04/27/2015) 1 Inhaler 6  . benzonatate (TESSALON) 100 MG capsule Take 1-2 capsules (100-200 mg total) by mouth 3 (three) times daily as needed. (Patient not taking: Reported on 04/27/2015) 30 capsule 0  . HYDROcodone-homatropine (HYCODAN) 5-1.5 MG/5ML syrup Take 5 mLs by mouth every 4 (four) hours as needed. (Patient not taking: Reported on 04/27/2015) 120 mL 0  . traMADol (ULTRAM) 50 MG tablet Take 1 tablet (50 mg total) by mouth every 8 (eight) hours as needed. (Patient not taking: Reported on 04/27/2015) 15 tablet 0   No current facility-administered medications for this visit.    Objective: BP 124/82 mmHg  Pulse 98  Temp(Src) 98.5 F (36.9 C)  Wt 221 lb (100.245 kg)  SpO2 98% Gen: NAD, resting comfortably HEENT: Turbinates erythematous with yellow drainage- worse on left, TM obscurred by cerumen- portion seen on right with mild erythema.  pharynx mildly  erythematous with no tonsilar exudate or edema, frontal and maxillary sinus tenderness CV: RRR no murmurs rubs or gallops Lungs: CTAB no crackles, wheeze, rhonchi Abdomen: soft/nontender/nondistended/normal bowel sounds. No rebound or guarding.  Ext: no edema Skin: warm, dry, no rash Neuro: grossly normal, moves all extremities  Assessment/Plan:  Sinsusitis Suspect Bacterial based day 9 of symptoms and not improving much. We printed an rx for augmentin which she will take if  Symptoms >10 days without improving, double sickening. Cant get a great view of ears but some inflammation on portion noted- augmentin would cover otitis media as well.   Treatment: -considered steroid: we opted out  -other symptomatic care with continue mucinex  Finally, we reviewed reasons to return to care including if symptoms worsen or persist or new concerns arise.  Meds ordered this encounter  Medications  . PRISTIQ 100 MG 24 hr tablet    Sig: TAKE 1 BY MOUTH DAILY    Dispense:  90 tablet    Refill:  3  . amoxicillin-clavulanate (AUGMENTIN) 875-125 MG tablet    Sig: Take 1 tablet by mouth 2 (two) times daily.    Dispense:  14 tablet    Refill:  0  higher risk due to age, asthma, diabetes

## 2015-04-27 NOTE — Patient Instructions (Signed)
  Sinsusitis Suspect Bacterial based day 9 of symptoms and not improving much. We printed an rx for augmentin which she will take if Symptoms >10 days without improving, double sickening,  Treatment: -considered steroid: we opted out  -other symptomatic care with continue mucinex  Finally, we reviewed reasons to return to care including if symptoms worsen or persist or new concerns arise.  Meds ordered this encounter  Medications  . PRISTIQ 100 MG 24 hr tablet    Sig: TAKE 1 BY MOUTH DAILY    Dispense:  90 tablet    Refill:  3  . amoxicillin-clavulanate (AUGMENTIN) 875-125 MG tablet    Sig: Take 1 tablet by mouth 2 (two) times daily.    Dispense:  14 tablet    Refill:  0

## 2015-05-02 ENCOUNTER — Encounter: Payer: Self-pay | Admitting: Family Medicine

## 2015-05-09 ENCOUNTER — Encounter: Payer: Self-pay | Admitting: Family Medicine

## 2015-05-10 MED ORDER — PREDNISONE 20 MG PO TABS: ORAL_TABLET | ORAL | Status: DC

## 2015-05-10 NOTE — Telephone Encounter (Signed)
See note

## 2015-05-20 ENCOUNTER — Encounter: Payer: Self-pay | Admitting: Family Medicine

## 2015-06-15 ENCOUNTER — Other Ambulatory Visit: Payer: Self-pay | Admitting: Family Medicine

## 2015-06-28 ENCOUNTER — Encounter: Payer: Self-pay | Admitting: Family Medicine

## 2015-06-28 ENCOUNTER — Ambulatory Visit (INDEPENDENT_AMBULATORY_CARE_PROVIDER_SITE_OTHER): Payer: Medicare Other | Admitting: Family Medicine

## 2015-06-28 VITALS — BP 146/78 | HR 67 | Temp 98.1°F | Ht 61.25 in | Wt 227.0 lb

## 2015-06-28 DIAGNOSIS — E119 Type 2 diabetes mellitus without complications: Secondary | ICD-10-CM

## 2015-06-28 DIAGNOSIS — E785 Hyperlipidemia, unspecified: Secondary | ICD-10-CM | POA: Diagnosis not present

## 2015-06-28 DIAGNOSIS — I10 Essential (primary) hypertension: Secondary | ICD-10-CM

## 2015-06-28 DIAGNOSIS — F329 Major depressive disorder, single episode, unspecified: Secondary | ICD-10-CM

## 2015-06-28 DIAGNOSIS — J302 Other seasonal allergic rhinitis: Secondary | ICD-10-CM

## 2015-06-28 DIAGNOSIS — F32A Depression, unspecified: Secondary | ICD-10-CM

## 2015-06-28 DIAGNOSIS — Z0001 Encounter for general adult medical examination with abnormal findings: Secondary | ICD-10-CM

## 2015-06-28 LAB — COMPREHENSIVE METABOLIC PANEL
ALBUMIN: 4.1 g/dL (ref 3.5–5.2)
ALK PHOS: 119 U/L — AB (ref 39–117)
ALT: 18 U/L (ref 0–35)
AST: 20 U/L (ref 0–37)
BUN: 14 mg/dL (ref 6–23)
CO2: 31 mEq/L (ref 19–32)
CREATININE: 0.89 mg/dL (ref 0.40–1.20)
Calcium: 10.2 mg/dL (ref 8.4–10.5)
Chloride: 102 mEq/L (ref 96–112)
GFR: 65.67 mL/min (ref >60.00)
Glucose, Bld: 115 mg/dL — ABNORMAL HIGH (ref 70–99)
POTASSIUM: 4.2 meq/L (ref 3.5–5.1)
SODIUM: 138 meq/L (ref 135–145)
TOTAL PROTEIN: 6.8 g/dL (ref 6.0–8.3)
Total Bilirubin: 0.7 mg/dL (ref 0.2–1.2)

## 2015-06-28 LAB — LIPID PANEL
CHOLESTEROL: 249 mg/dL — AB (ref 0–200)
HDL: 48.1 mg/dL (ref >39.00)
LDL Cholesterol: 167 mg/dL — ABNORMAL HIGH (ref 0–99)
NonHDL: 201.13
Total CHOL/HDL Ratio: 5
Triglycerides: 170 mg/dL — ABNORMAL HIGH (ref 0.0–149.0)
VLDL: 34 mg/dL (ref 0.0–40.0)

## 2015-06-28 LAB — CBC WITH DIFFERENTIAL/PLATELET
Basophils Absolute: 0 10*3/uL (ref 0.0–0.1)
Basophils Relative: 0.8 % (ref 0.0–3.0)
EOS PCT: 9.3 % — AB (ref 0.0–5.0)
Eosinophils Absolute: 0.6 10*3/uL (ref 0.0–0.7)
HEMATOCRIT: 42.6 % (ref 36.0–46.0)
HEMOGLOBIN: 14 g/dL (ref 12.0–15.0)
LYMPHS PCT: 25.8 % (ref 12.0–46.0)
Lymphs Abs: 1.5 10*3/uL (ref 0.7–4.0)
MCHC: 33 g/dL (ref 30.0–36.0)
MCV: 86.7 fl (ref 78.0–100.0)
MONO ABS: 0.6 10*3/uL (ref 0.1–1.0)
MONOS PCT: 9.7 % (ref 3.0–12.0)
Neutro Abs: 3.2 10*3/uL (ref 1.4–7.7)
Neutrophils Relative %: 54.4 % (ref 43.0–77.0)
Platelets: 207 10*3/uL (ref 150.0–400.0)
RBC: 4.92 Mil/uL (ref 3.87–5.11)
RDW: 14.6 % (ref 11.5–15.5)
WBC: 5.9 10*3/uL (ref 4.0–10.5)

## 2015-06-28 LAB — HEMOGLOBIN A1C: HEMOGLOBIN A1C: 6.3 % (ref 4.6–6.5)

## 2015-06-28 NOTE — Patient Instructions (Addendum)
We will call you within a week about your referral to allergist. If you do not hear within 2 weeks, give [place] a call.   Just want to thank you for being open about how you are feeling. We really want to try to help you so please let me know what you decide by the end of the week through mychart 3 options on depression- 1. Add counseling 2. Do Gene test- call them to see what you need to do 3. Add wellbutrin as adjunct therapy  Your blood pressure trend concerns me. I would like for you to use a home cuff to check at least 2x a week. Your goal is <140/90. If you note in the next few weeks that it is higher than our goal, see me sooner. Otherwise, see me in 6 weeks to also check in on depression. Bring your home cuff and your log of blood pressures with you to visit.   Labs before you leave

## 2015-06-28 NOTE — Assessment & Plan Note (Signed)
S:Allergies- doing better with change to xyzal and continued flonase. Has been a very tough season-  A/P:would like to see allergist again who saw years ago. Will refer back. Previously saw Dr. Velora Heckler

## 2015-06-28 NOTE — Progress Notes (Signed)
Phone: [phone]  Subjective:  Patient presents today for their annual physical. Chief complaint-noted.   See problem oriented charting- ROS- full  review of systems was completed and negative except for: admits to depressed mood and some anhedonia at times. No SI/HI.   The following were reviewed and entered/updated in epic: Past Medical History  Diagnosis Date  . Asthma   . Depression   . Vitamin B 12 deficiency   . Anemia   . Thyroid disease   . Hypothyroidism   . Sleep difficulties     had sleep study -2009, prior to gastric surgery, told that there was not a need for f/u  . Arthritis     osteoarthritis - shoulder, knees & hips  . Hypertension     Dr. Orinda Kenner manages BP, pt. reports MD has not found a need for treatment   . Diabetes mellitus without complication ([place])     no meds   Patient Active Problem List   Diagnosis Date Noted  . Hypercalcemia 12/29/2014    Priority: High  . Well controlled type 2 diabetes mellitus ([place]) 07/18/2006    Priority: High  . Morbid obesity ([place]) 06/28/2015    Priority: Medium  . History of gastric bypass 02/08/2014    Priority: Medium  . Hyperlipidemia 01/05/2008    Priority: Medium  . Unspecified hypothyroidism 09/09/2007    Priority: Medium  . Depression 07/18/2006    Priority: Medium  . Essential hypertension 07/18/2006    Priority: Medium  . ASTHMA 07/18/2006    Priority: Medium  . Allergic rhinitis 07/20/2014    Priority: Low  . Osteoarthritis 10/05/2013    Priority: Low  . CHEST WALL PAIN, ANTERIOR 10/09/2007    Priority: Low   Past Surgical History  Procedure Laterality Date  . Abdominal hysterectomy    . Oophorectomy    . Bariatric surgery    . Pantallor arthrodesis with rod placement left foot    . Cardiac catheterization      [facility]- [facility] yrs. ago  . Tonsillectomy    . Total shoulder arthroplasty Left 03/12/2012    Procedure: TOTAL SHOULDER ARTHROPLASTY;  Surgeon: Marin Shutter, MD;  Location: [facility];   Service: Orthopedics;  Laterality: Left;    Family History  Problem Relation Age of Onset  . Stroke Mother   . Liver cancer Father   . Cancer Father     liver    Medications- reviewed and updated Current Outpatient Prescriptions  Medication Sig Dispense Refill  . albuterol (PROVENTIL HFA;VENTOLIN HFA) 108 (90 Base) MCG/ACT inhaler Inhale 1-2 puffs into the lungs every 4 (four) hours as needed for wheezing or shortness of breath. 1 Inhaler 6  . amLODipine (NORVASC) 10 MG tablet TAKE ONE TABLET BY MOUTH ONCE DAILY 90 tablet 3  . aspirin 81 MG tablet Take 81 mg by mouth daily.    . Cholecalciferol (VITAMIN D) 1000 UNITS capsule Take 1,000 Units by mouth 2 (two) times daily.     . cyanocobalamin (,VITAMIN B-12,) 1000 MCG/ML injection Inject 1 mL (1,000 mcg total) into the muscle every 30 (thirty) days. Uses on amonthly basis 10 mL 2  . diclofenac (VOLTAREN) 75 MG EC tablet Take 1 tablet (75 mg total) by mouth 2 (two) times daily. 180 tablet 3  . FeFum-FePo-FA-B Cmp-C-Zn-Mn-Cu (TANDEM PLUS) 162-115.2-1 MG CAPS TAKE ONE CAPSULE BY MOUTH TWICE DAILY 180 each 3  . Levothyroxine Sodium (TIROSINT) 100 MCG CAPS Take 200 mcg by mouth daily before breakfast.     .  Omega-3 Fatty Acids (FISH OIL) 1000 MG CAPS Take 1,000 mg by mouth 2 (two) times daily.     Marland Kitchen PRISTIQ 100 MG 24 hr tablet TAKE 1 BY MOUTH DAILY 90 tablet 3   No current facility-administered medications for this visit.    Allergies-reviewed and updated Allergies  Allergen Reactions  . Flexeril [Cyclobenzaprine]     On Pristiq  With possible serotonin reaction to flexeril  . Ace Inhibitors     States after a surgery she was told this. Not sure what the reason was.   . Azithromycin     REACTION: Rash  . Codeine     REACTION: Upset stomach    Social History   Social History  . Marital Status: Widowed    Spouse Name: N/A  . Number of Children: N/A  . Years of Education: N/A   Occupational History  . retired Marine scientist     Social History Main Topics  . Smoking status: Former Smoker    Quit date: 03/04/1978  . Smokeless tobacco: None  . Alcohol Use: 1.2 oz/week    2 Glasses of wine per week  . Drug Use: No  . Sexual Activity: Not Currently   Other Topics Concern  . None   Social History Narrative   Widowed 2013. 2 kids. 4 grandkids. Oldest daughter lives in [place] and has 2 grandkids that are now in Scientist, physiological, PT school at [employer].       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 Oropharynx normal Neck: no thyromegaly CV: RRR no murmurs rubs or gallops Lungs: CTAB no crackles, wheeze, rhonchi Abdomen: soft/nontender/nondistended/normal bowel sounds. Morbidly obese Ext: no edema Skin: warm, dry Neuro: grossly normal, moves all extremities, PERRLA  Assessment/Plan:  75 y.o. female presenting for annual physical.  Health Maintenance counseling: 1. Anticipatory guidance: Patient counseled regarding regular dental exams, eye exams, wearing seatbelts.  2. Risk factor reduction:  Advised patient of need for regular exercise and diet rich and fruits and vegetables to reduce risk of heart attack and stroke. Low exercise- advised at least10-15 minutes a day as may help with depression.  3. Immunizations/screenings/ancillary studies Immunization History  Administered Date(s) Administered  . Influenza Split 10/22/2010, 11/14/2011  . Influenza Whole 11/13/2006, 10/27/2008  . Influenza, High Dose Seasonal PF 10/28/2014  . Influenza,inj,Quad PF,36+ Mos 10/05/2012, 10/05/2013  . Pneumococcal Conjugate-13 03/01/2013  . Pneumococcal Polysaccharide-23 05/31/2006  . Td 11/24/1998, 03/01/2013   . Zoster 06/20/2006  4. Cervical cancer screening- has passed age based screening 5. Breast cancer screening-  breast exam declined and mammogram 05/05/13, encouraged scheduled follow up 6. Colon cancer screening - 10/19/14 with 10 year follow up  Depression screen [facility] 2/9 06/28/2015 04/23/2015 03/02/2015 07/20/2014 03/01/2013  Decreased Interest 1 0 0 0 1  Down, Depressed, Hopeless 1 0 0 0 0  PHQ - 2 Score 2 0 0 0 1  Altered sleeping 0 - - - -  Tired, decreased energy 0 - - - -  Change in appetite 1 - - - -  Feeling bad or failure about yourself  0 - - - -  Trouble concentrating 0 - - - -  Moving slowly or fidgety/restless 0 - - - -  Suicidal thoughts 0 - - - -  PHQ-9 Score 3 - - - -  Difficult doing work/chores Not difficult at all - - - -    Fall Risk  06/28/2015 03/02/2015 07/20/2014 03/01/2013  Falls in the past year? Yes Yes No No  Number falls in past yr: 1 1 - -  Injury with Fall? Yes No - -  Follow up Education provided - - -  missed curb- we discussed preventative measures  Status of chronic or acute concerns   Hypothryoidism- controlled on levothyroxine 142mcg managed by endocrine.   Asthma reasonable control- when allergies controlled so that should be improve dby allergy referral  [facility] doesn't carry voltaren version that is helpful to her- will request from another pharmacy. We discussed cardiac, kidney risks- she would like to continue regardless  Morbid obesity ([place]) S: patient admits to low motivation to exercise with depression and does tend to overeat A/P: work to control depression. Encouraged need for healthy eating, regular exercise, weight loss.   Well controlled type 2 diabetes mellitus ([place]) S:diet controlled after gastric bypass. Weight creeping up A/P: needs to reverse weight trend, update a1c today   Hyperlipidemia S: statin intolerant even to once a week atorvastatin. Has tried multiple other regimens. myalgias A/P: weight loss is going to be only thing  that helps here given statin intolerance. Suspect zetia alone would cause similar issues and no mortality data so remain without statin. Update lipids today though.    Essential hypertension S: controlled poorly on amlodipine 10mg  but controlled on last 2 visits- prior to that had several elevations BP Readings from Last 3 Encounters:  06/28/15 146/78  04/27/15 124/82  04/23/15 114/70  A/P:Continue current meds:  Discussed home monitoring and 6 week repeat with depression follow up. If persistently elevated consider perhaps bystolic? Not best idea with asthma but given breathing issues on ace-i also wants to avoid ARB- had been in ICU. HCTZ causes hyeprcalcemia so options limited.    Allergic rhinitis S:Allergies- doing better with change to xyzal and continued flonase. Has been a very tough season-  A/P:would like to see allergist again who saw years ago. Will refer back. Previously saw Dr. Velora Heckler  Depression S:reasonable control with PHQ9 of 3 today but she states she would liek to be ina  Better place than she is. Had been on xanax on past. Failed multiple other therapies including effexor.  A/P: 3 options - wants to consider gene testing- she can call # she has; add wellbutrin 150mg  XL; add counseling- she will reach out by end of the week to let me know her decision.      Return in about 6 weeks (around 08/09/2015). Return precautions advised.   Orders Placed This Encounter  Procedures  . Hemoglobin A1c    [place]  . CBC with Differential/Platelet  . Comprehensive metabolic panel    [place]    Order Specific Question:  Has the patient fasted?    Answer:  No  . Lipid panel    [place]    Order Specific Question:  Has the patient fasted?    Answer:  No  . Ambulatory referral to Allergy    Referral Priority:  Routine    Referral Type:  Allergy Testing    Referral Reason:  Specialty Services Required    Requested Specialty:  Allergy    Number of Visits Requested:  1    Garret Reddish, MD

## 2015-06-28 NOTE — Assessment & Plan Note (Signed)
S: statin intolerant even to once a week atorvastatin. Has tried multiple other regimens. myalgias A/P: weight loss is going to be only thing that helps here given statin intolerance. Suspect zetia alone would cause similar issues and no mortality data so remain without statin. Update lipids today though.

## 2015-06-28 NOTE — Assessment & Plan Note (Signed)
S: patient admits to low motivation to exercise with depression and does tend to overeat A/P: work to control depression. Encouraged need for healthy eating, regular exercise, weight loss.

## 2015-06-28 NOTE — Assessment & Plan Note (Signed)
S:diet controlled after gastric bypass. Weight creeping up A/P: needs to reverse weight trend, update a1c today

## 2015-06-28 NOTE — Assessment & Plan Note (Signed)
S:reasonable control with PHQ9 of 3 today but she states she would liek to be ina  Better place than she is. Had been on xanax on past. Failed multiple other therapies including effexor.  A/P: 3 options - wants to consider gene testing- she can call # she has; add wellbutrin 150mg  XL; add counseling- she will reach out by end of the week to let me know her decision.

## 2015-06-28 NOTE — Progress Notes (Signed)
Pre visit review using our clinic review tool, if applicable. No additional management support is needed unless otherwise documented below in the visit note. 

## 2015-06-28 NOTE — Assessment & Plan Note (Signed)
S: controlled poorly on amlodipine 10mg  but controlled on last 2 visits- prior to that had several elevations BP Readings from Last 3 Encounters:  06/28/15 146/78  04/27/15 124/82  04/23/15 114/70  A/P:Continue current meds:  Discussed home monitoring and 6 week repeat with depression follow up. If persistently elevated consider perhaps bystolic? Not best idea with asthma but given breathing issues on ace-i also wants to avoid ARB- had been in ICU. HCTZ causes hyeprcalcemia so options limited.

## 2015-06-29 ENCOUNTER — Encounter: Payer: Self-pay | Admitting: Family Medicine

## 2015-06-29 MED ORDER — ATORVASTATIN CALCIUM 20 MG PO TABS: 20.0000 mg | ORAL_TABLET | ORAL | Status: DC

## 2015-06-30 ENCOUNTER — Other Ambulatory Visit: Payer: Self-pay | Admitting: Family Medicine

## 2015-06-30 MED ORDER — DICLOFENAC SODIUM ER 100 MG PO TB24: 100.0000 mg | ORAL_TABLET | Freq: Every day | ORAL | Status: DC

## 2015-08-16 ENCOUNTER — Encounter: Payer: Self-pay | Admitting: Family Medicine

## 2015-08-16 ENCOUNTER — Ambulatory Visit (INDEPENDENT_AMBULATORY_CARE_PROVIDER_SITE_OTHER): Payer: Medicare Other | Admitting: Family Medicine

## 2015-08-16 DIAGNOSIS — F329 Major depressive disorder, single episode, unspecified: Secondary | ICD-10-CM

## 2015-08-16 DIAGNOSIS — E785 Hyperlipidemia, unspecified: Secondary | ICD-10-CM

## 2015-08-16 DIAGNOSIS — F32A Depression, unspecified: Secondary | ICD-10-CM

## 2015-08-16 DIAGNOSIS — I10 Essential (primary) hypertension: Secondary | ICD-10-CM

## 2015-08-16 NOTE — Assessment & Plan Note (Signed)
S: controlled on amlodipine 10mg . Home readings in 160s or 170s BP Readings from Last 3 Encounters:  08/16/15 126/82  06/28/15 (!) 146/78  04/27/15 124/82  A/P:Continue current meds:  Consider bystolic in future but asthma. See prior notes with meds to avoid. Discrepancy between home and office- log 2-3x a week and bring cuff next visit

## 2015-08-16 NOTE — Progress Notes (Signed)
Subjective:  [name] is a 75 y.o. year old very pleasant female patient who presents for/with See problem oriented charting ROS- no chest pain. Denies shortness of breath. Denies fever or nausea. Is sleepy.  see any ROS included in HPI as well.   Past Medical History-  Patient Active Problem List   Diagnosis Date Noted  . Hypercalcemia 12/29/2014    Priority: High  . Well controlled type 2 diabetes mellitus ([place]) 07/18/2006    Priority: High  . Morbid obesity ([place]) 06/28/2015    Priority: Medium  . History of gastric bypass 02/08/2014    Priority: Medium  . Hyperlipidemia 01/05/2008    Priority: Medium  . Unspecified hypothyroidism 09/09/2007    Priority: Medium  . Depression 07/18/2006    Priority: Medium  . Essential hypertension 07/18/2006    Priority: Medium  . ASTHMA 07/18/2006    Priority: Medium  . Allergic rhinitis 07/20/2014    Priority: Low  . Osteoarthritis 10/05/2013    Priority: Low  . CHEST WALL PAIN, ANTERIOR 10/09/2007    Priority: Low    Medications- reviewed and updated Current Outpatient Prescriptions  Medication Sig Dispense Refill  . albuterol (PROVENTIL HFA;VENTOLIN HFA) 108 (90 Base) MCG/ACT inhaler Inhale 1-2 puffs into the lungs every 4 (four) hours as needed for wheezing or shortness of breath. 1 Inhaler 6  . amLODipine (NORVASC) 10 MG tablet TAKE ONE TABLET BY MOUTH ONCE DAILY 90 tablet 3  . aspirin 81 MG tablet Take 81 mg by mouth daily.    Marland Kitchen atorvastatin (LIPITOR) 20 MG tablet Take 1 tablet (20 mg total) by mouth once a week. 13 tablet 3  . Cholecalciferol (VITAMIN D) 1000 UNITS capsule Take 1,000 Units by mouth 2 (two) times daily.     . cyanocobalamin (,VITAMIN B-12,) 1000 MCG/ML injection Inject 1 mL (1,000 mcg total) into the muscle every 30 (thirty) days. Uses on amonthly basis 10 mL 2  . Diclofenac Sodium CR 100 MG 24 hr tablet Take 1 tablet (100 mg total) by mouth daily. 30 tablet 5  . FeFum-FePo-FA-B Cmp-C-Zn-Mn-Cu (TANDEM  PLUS) 162-115.2-1 MG CAPS TAKE ONE CAPSULE BY MOUTH TWICE DAILY 180 each 3  . Levothyroxine Sodium (TIROSINT) 100 MCG CAPS Take 200 mcg by mouth daily before breakfast.     . Omega-3 Fatty Acids (FISH OIL) 1000 MG CAPS Take 1,000 mg by mouth 2 (two) times daily.     Marland Kitchen PRISTIQ 100 MG 24 hr tablet TAKE 1 BY MOUTH DAILY 90 tablet 3   No current facility-administered medications for this visit.     Objective: BP 126/82 (BP Location: Left Arm, Patient Position: Sitting, Cuff Size: Large)   Pulse 68   Temp 97.7 F (36.5 C) (Oral)   Wt 224 lb (101.6 kg)   SpO2 97%   BMI 41.98 kg/m  Gen: NAD, resting comfortably Oropharynx normal, Mucous membranes are moist. CV: RRR no murmurs rubs or gallops Lungs: CTAB no crackles, wheeze, rhonchi Abdomen: soft/nontender/nondistended/normal bowel sounds. obese Ext: trace edema Skin: warm, dry Neuro: grossly normal, moves all extremities  Assessment/Plan:  Sleepy, fatigued, diffuse weakness S: Patient states she just got back from a long traveling trip. She has been much more tired and sleeping more but then gets up and does activity and feels she could sleep some more. Feels like she needs to take more deep breaths but denies shortness of breath. She feels tired when up for any prolonged period. Does admit she did a lot of walking on  trip and really had to exert herself and did fine with this. Not worsening. Not wheezing A/P: Patient may simply be recovering from trip. We discussed workup such as EKG, labs, potential stress test if patient was not improving over the next week or two. Follow up sooner with worsening symptoms or chest pain.   Hyperlipidemia S: poorly controlled on no medicine previous- started atorvastatin 20mg  once a week last visit in early June. Mild myalgias.  Lab Results  Component Value Date   CHOL 249 (H) 06/28/2015   HDL 48.10 06/28/2015   LDLCALC 167 (H) 06/28/2015   LDLDIRECT 119.2 01/04/2014   TRIG 170.0 (H) 06/28/2015    CHOLHDL 5 06/28/2015   A/P: hopeful improved control on once a week statin- update ldl next labs  Essential hypertension S: controlled on amlodipine 10mg . Home readings in 160s or 170s BP Readings from Last 3 Encounters:  08/16/15 126/82  06/28/15 (!) 146/78  04/27/15 124/82  A/P:Continue current meds:  Consider bystolic in future but asthma. See prior notes with meds to avoid. Discrepancy between home and office- log 2-3x a week and bring cuff next visit  Depression S:feels better after trip. Also wonders if change in caffeine or being with people helped. PHQ9 was 3 last visit. On pristiq A/P: I think patient being active around friends has really helped her- encouraged her to continue working on activities she enjoys. In addition- this would make me lean towards counseling as next step if needed  verbal 3-4 months  Return precautions advised.  Garret Reddish, MD

## 2015-08-16 NOTE — Assessment & Plan Note (Signed)
S: poorly controlled on no medicine previous- started atorvastatin 20mg  once a week last visit in early June. Mild myalgias.  Lab Results  Component Value Date   CHOL 249 (H) 06/28/2015   HDL 48.10 06/28/2015   LDLCALC 167 (H) 06/28/2015   LDLDIRECT 119.2 01/04/2014   TRIG 170.0 (H) 06/28/2015   CHOLHDL 5 06/28/2015   A/P: hopeful improved control on once a week statin- update ldl next labs

## 2015-08-16 NOTE — Progress Notes (Signed)
Pre visit review using our clinic review tool, if applicable. No additional management support is needed unless otherwise documented below in the visit note. 

## 2015-08-16 NOTE — Patient Instructions (Signed)
Glad cholesterol medicine treating you ok  Blood pressure better in office ok but at home is high- bring cuff to next visit- check a few times a week at least 2-3x  Glad the depression seems to be better. Let me know if not- I think counseling would be good option if needed  Let me know if you do not feel like you are recovering from your trip over next few weeks

## 2015-08-16 NOTE — Assessment & Plan Note (Signed)
S:feels better after trip. Also wonders if change in caffeine or being with people helped. PHQ9 was 3 last visit. On pristiq A/P: I think patient being active around friends has really helped her- encouraged her to continue working on activities she enjoys. In addition- this would make me lean towards counseling as next step if needed

## 2015-08-17 ENCOUNTER — Other Ambulatory Visit: Payer: Self-pay

## 2015-08-17 MED ORDER — DICLOFENAC SODIUM ER 100 MG PO TB24: 100.0000 mg | ORAL_TABLET | Freq: Every day | ORAL | 1 refills | Status: DC

## 2015-08-22 ENCOUNTER — Ambulatory Visit (INDEPENDENT_AMBULATORY_CARE_PROVIDER_SITE_OTHER): Payer: Medicare Other | Admitting: Allergy and Immunology

## 2015-08-22 ENCOUNTER — Encounter: Payer: Self-pay | Admitting: Allergy and Immunology

## 2015-08-22 VITALS — BP 118/82 | HR 76 | Temp 97.7°F | Resp 20 | Ht 60.0 in | Wt 227.0 lb

## 2015-08-22 DIAGNOSIS — J309 Allergic rhinitis, unspecified: Secondary | ICD-10-CM

## 2015-08-22 DIAGNOSIS — J4520 Mild intermittent asthma, uncomplicated: Secondary | ICD-10-CM | POA: Diagnosis not present

## 2015-08-22 DIAGNOSIS — H1010 Acute atopic conjunctivitis, unspecified eye: Secondary | ICD-10-CM

## 2015-08-22 NOTE — Progress Notes (Signed)
Dear Dr. Yong Channel,  Thank you for referring [name] to the [facility] on 08/22/2015.   Below is a summation of this patient's evaluation and recommendations.  Thank you for your referral. I will keep you informed about this patient's response to treatment.   If you have any questions please to do hesitate to contact me.   Sincerely,  Jiles Prows, MD [place]   ______________________________________________________________________    NEW PATIENT NOTE  Referring Provider: Marin Olp, MD Primary Provider: Garret Reddish, MD Date of office visit: 08/22/2015    Subjective:   Chief Complaint:  [name] (DOB: 1940-04-28) is a 75 y.o. female with a chief complaint of Allergies and Nasal Congestion  who presents to the clinic on 08/22/2015 with the following problems:  HPI: [name] presents to this clinic in evaluation of allergic disease. She has a long history of allergic disease involving both her chest and nose and eyes for which she saw an allergist and was treated with immunotherapy for many years which she discontinued after having good results regarding all of her atopic disease.  Her asthma has never really returned other than requiring her to use a short acting bronchodilator just a few times a year. She has not required a systemic steroid in years. Recently she did go on a trip out [place] and while at high elevations and hiking she did get some shortness of breath and used her short acting bronchodilator.  Unfortunately, her eye and nose issue did not maintain good control after using a course of immunotherapy. She has had recrudescence of this issue with eye watering and stuffy nose and sneezing and some postnasal drip occurring on a perennial basis with springtime exacerbation especially after exposure to cats. She has intermittent loss of smell although  she can smell food for the most part. She has intermittent headaches affecting her frontal area about 1 time per week that is pressure-like and is not associated with any neurological symptoms and appears to correlate with her nasal congestion.  [name] did have a history of early eczema but this has resolved and has not been an issue in decades.  Past Medical History:  Diagnosis Date  . Anemia   . Arthritis    osteoarthritis - shoulder, knees & hips  . Asthma   . Depression   . Diabetes mellitus without complication (HCC)    no meds  . Hypertension    Dr. Orinda [facility] manages BP, pt. reports MD has not found a need for treatment   . Hypothyroidism   . Sleep difficulties    had sleep study -2009, prior to gastric surgery, told that there was not a need for f/u  . Thyroid disease   . Vitamin B 12 deficiency     Past Surgical History:  Procedure Laterality Date  . ABDOMINAL HYSTERECTOMY    . BARIATRIC SURGERY    . CARDIAC CATHETERIZATION     [facility]- 30 yrs. ago  . OOPHORECTOMY    . pantallor arthrodesis with rod placement left foot    . TONSILLECTOMY    . TOTAL SHOULDER ARTHROPLASTY Left 03/12/2012   Procedure: TOTAL SHOULDER ARTHROPLASTY;  Surgeon: Marin Shutter, MD;  Location: [facility];  Service: Orthopedics;  Laterality: Left;      Medication List      albuterol 108 (90 Base) MCG/ACT inhaler Commonly known as:  PROVENTIL HFA;VENTOLIN  HFA Inhale 1-2 puffs into the lungs every 4 (four) hours as needed for wheezing or shortness of breath.   amLODipine 10 MG tablet Commonly known as:  NORVASC TAKE ONE TABLET BY MOUTH ONCE DAILY   aspirin 81 MG tablet Take 81 mg by mouth daily.   atorvastatin 20 MG tablet Commonly known as:  LIPITOR Take 1 tablet (20 mg total) by mouth once a week.   cyanocobalamin 1000 MCG/ML injection Commonly known as:  (VITAMIN B-12) Inject 1 mL (1,000 mcg total) into the muscle every 30 (thirty) days. Uses on amonthly basis   Diclofenac Sodium CR  100 MG 24 hr tablet Take 1 tablet (100 mg total) by mouth daily.   Fish Oil 1000 MG Caps Take 1,000 mg by mouth 2 (two) times daily.   PRISTIQ 100 MG 24 hr tablet Generic drug:  desvenlafaxine TAKE 1 BY MOUTH DAILY   TANDEM PLUS 162-115.2-1 MG Caps TAKE ONE CAPSULE BY MOUTH TWICE DAILY   TIROSINT 100 MCG Caps Generic drug:  Levothyroxine Sodium Take 200 mcg by mouth daily before breakfast.   Vitamin D 1000 units capsule Take 1,000 Units by mouth 2 (two) times daily.       Allergies  Allergen Reactions  . Flexeril [Cyclobenzaprine]     On Pristiq  With possible serotonin reaction to flexeril  . Ace Inhibitors     States after a surgery she was told this. Not sure what the reason was.   . Azithromycin     REACTION: Rash  . Codeine     REACTION: Upset stomach    Review of systems negative except as noted in HPI / PMHx or noted below:  Review of Systems  Constitutional: Negative.   HENT: Negative.   Eyes: Negative.   Respiratory: Negative.   Cardiovascular: Negative.   Gastrointestinal: Negative.   Genitourinary: Negative.   Musculoskeletal: Negative.   Skin: Negative.   Neurological: Negative.   Endo/Heme/Allergies: Negative.   Psychiatric/Behavioral: Negative.     Family History  Problem Relation Age of Onset  . Stroke Mother   . Liver cancer Father   . Cancer Father     liver    Social History   Social History  . Marital status: Widowed    Spouse name: N/A  . Number of children: N/A  . Years of education: N/A   Occupational History  . retired Marine scientist Retired   Social History Main Topics  . Smoking status: Former Smoker    Quit date: 03/04/1978  . Smokeless tobacco: Never Used  . Alcohol use 1.2 oz/week    2 Glasses of wine per week  . Drug use: No  . Sexual activity: Not Currently   Other Topics Concern  . Not on file   Social History Narrative   Widowed 2013. 2 kids. 4 grandkids. Oldest daughter lives in [place] and has 2 grandkids  that are now in Scientist, physiological, PT school at [employer].       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   Objective:   Vitals:   08/22/15 0923  BP: 118/82  Pulse: 76  Resp: 20  Temp: 97.7 F (  36.5 C)   Height: 5' (152.4 cm) Weight: 227 lb (103 kg)  Physical Exam  Constitutional: She is well-developed, well-nourished, and in no distress.  HENT:  Head: Normocephalic. Head is without right periorbital erythema and without left periorbital erythema.  Right Ear: Tympanic membrane, external ear and ear canal normal.  Left Ear: Tympanic membrane, external ear and ear canal normal.  Nose: Mucosal edema present. No rhinorrhea.  Mouth/Throat: Oropharynx is clear and moist and mucous membranes are normal. No oropharyngeal exudate.  Eyes: Conjunctivae and lids are normal. Pupils are equal, round, and reactive to light.  Neck: Trachea normal. No tracheal deviation present. No thyromegaly present.  Cardiovascular: Normal rate, regular rhythm, S1 normal, S2 normal and normal heart sounds.   No murmur heard. Pulmonary/Chest: Effort normal. No stridor. No tachypnea. No respiratory distress. She has no wheezes. She has no rales. She exhibits no tenderness.  Abdominal: Soft. She exhibits no distension and no mass. There is no hepatosplenomegaly. There is no tenderness. There is no rebound and no guarding.  Musculoskeletal: She exhibits no edema or tenderness.  Lymphadenopathy:       Head (right side): No tonsillar adenopathy present.       Head (left side): No tonsillar adenopathy present.    She has no cervical adenopathy.    She has no axillary adenopathy.  Neurological: She is alert.  Gait normal.  Skin: No rash noted. She is not diaphoretic. No erythema. No pallor. Nails show no clubbing.  Psychiatric: Mood and affect normal.     Diagnostics: Allergy skin tests were performed. She demonstrated hypersensitivity against cedar trees, cat, dog, and dust mite.  Spirometry was performed and demonstrated an FEV1 of 1.63 @ 92 % of predicted.  The patient had an Asthma Control Test with the following results:  .     Assessment and Plan:    1. Asthma, mild intermittent, well-controlled   2. Allergic rhinoconjunctivitis     1. Allergen avoidance measures  2. Treat and prevent inflammation:   A. OTC Rhinocort one spray each nostril one time per day. Coupon. Sample.  3. If needed:   A. OTC antihistamine - Claritin/Zyrtec/Allegra  B. OTC eyedrop - Zaditor  C. Proventil HFA 2 puffs every 4-6 hours  4. Immunotherapy?  5. Further evaluation and treatment?  6. Return to clinic in 4 weeks or earlier if problem  [name] should do well with a Combination of allergen avoidance measures and the use of some anti-inflammatory medications to address her upper airway atopic disease and she has several medication she can choose from regarding relief of symptoms utilized on an as-needed basis. She would definitely be a candidate for immunotherapy if she fails medical therapy. I did give her literature on this form of therapy during today's visit. Her asthma does not require any additional therapy at this point in time unless of course her pattern changes in the future.  Jiles Prows, MD [facility]

## 2015-08-22 NOTE — Patient Instructions (Addendum)
  1. Allergen avoidance measures  2. Treat and prevent inflammation:   A. OTC Rhinocort one spray each nostril one time per day. Coupon. Sample.  3. If needed:   A. OTC antihistamine - Claritin/Zyrtec/Allegra  B. OTC eyedrop - Zaditor  C. Proventil HFA 2 puffs every 4-6 hours  4. Immunotherapy?  5. Further evaluation and treatment?  6. Return to clinic in 4 weeks or earlier if problem

## 2015-09-19 ENCOUNTER — Encounter: Payer: Self-pay | Admitting: Allergy and Immunology

## 2015-09-19 ENCOUNTER — Ambulatory Visit (INDEPENDENT_AMBULATORY_CARE_PROVIDER_SITE_OTHER): Payer: Medicare Other | Admitting: Allergy and Immunology

## 2015-09-19 VITALS — BP 126/86 | HR 72 | Resp 16

## 2015-09-19 DIAGNOSIS — H04123 Dry eye syndrome of bilateral lacrimal glands: Secondary | ICD-10-CM | POA: Diagnosis not present

## 2015-09-19 DIAGNOSIS — H1010 Acute atopic conjunctivitis, unspecified eye: Secondary | ICD-10-CM | POA: Diagnosis not present

## 2015-09-19 DIAGNOSIS — J4520 Mild intermittent asthma, uncomplicated: Secondary | ICD-10-CM | POA: Diagnosis not present

## 2015-09-19 DIAGNOSIS — J309 Allergic rhinitis, unspecified: Secondary | ICD-10-CM

## 2015-09-19 NOTE — Patient Instructions (Addendum)
  1. Continue to perform Allergen avoidance measures  2. Continue to Treat and prevent inflammation:   A. OTC Rhinocort/ flonase/ nasacort 1-2 spray each nostril one time per day.  B. start montelukast 10 mg one tablet one time per day  3. If needed:   A. try to avoid over-the-counter antihistamines  B. use OTC Systane 1 drop each eye multiple times a day  C. Can add Pazeo one drop each eye one time per day  C. Proventil HFA 2 puffs every 4-6 hours  4. Consider a course of Immunotherapy  5. Obtain a fall flu vaccine  6. Return to clinic in 6 months or earlier if problem

## 2015-09-19 NOTE — Progress Notes (Signed)
Follow-up Note  Referring Provider: Marin Olp, MD Primary Provider: Garret Reddish, MD Date of Office Visit: 09/19/2015  Subjective:   [name] (DOB: 20-Apr-1940) is a 75 y.o. female who returns to the [facility] on 09/19/2015 in re-evaluation of the following:  HPI: Kiamesha returns to this clinic in reevaluation of her asthma and allergic rhinoconjunctivitis. I last saw her in his clinic during her initial evaluation of 22 August 2015.  While attempting allergen avoidance measures, with the understanding that her dog sleeps next to her head every night, and consistently using a nasal steroid she has had some improvement regarding her upper airway symptoms but her eyes are still itchy and watery and overall irritated. Most of her stuffy nose and sneezing and postnasal drip has resolved. She's not having any headaches. She does have eye watering pretty consistently. She has a history of dry eye syndrome and is treating herself with wetting solution for her eyes.  Asthma has not been an issue and she's had no need to use any Proventil.    Medication List      albuterol 108 (90 Base) MCG/ACT inhaler Commonly known as:  PROVENTIL HFA;VENTOLIN HFA Inhale 1-2 puffs into the lungs every 4 (four) hours as needed for wheezing or shortness of breath.   amLODipine 10 MG tablet Commonly known as:  NORVASC TAKE ONE TABLET BY MOUTH ONCE DAILY   aspirin 81 MG tablet Take 81 mg by mouth daily.   atorvastatin 20 MG tablet Commonly known as:  LIPITOR Take 1 tablet (20 mg total) by mouth once a week.   cyanocobalamin 1000 MCG/ML injection Commonly known as:  (VITAMIN B-12) Inject 1 mL (1,000 mcg total) into the muscle every 30 (thirty) days. Uses on amonthly basis   Diclofenac Sodium CR 100 MG 24 hr tablet Take 1 tablet (100 mg total) by mouth daily.   Fish Oil 1000 MG Caps Take 1,000 mg by mouth 2 (two) times daily.   FLONASE ALLERGY RELIEF 50  MCG/ACT nasal spray Generic drug:  fluticasone Place 1 spray into both nostrils daily.   NASACORT ALLERGY 24HR 55 MCG/ACT Aero nasal inhaler Generic drug:  triamcinolone Place 1 spray into the nose daily.   PRISTIQ 100 MG 24 hr tablet Generic drug:  desvenlafaxine TAKE 1 BY MOUTH DAILY   RHINOCORT ALLERGY 32 MCG/ACT nasal spray Generic drug:  budesonide Place 1 spray into both nostrils daily.   TANDEM PLUS 162-115.2-1 MG Caps TAKE ONE CAPSULE BY MOUTH TWICE DAILY   TIROSINT 100 MCG Caps Generic drug:  Levothyroxine Sodium Take 200 mcg by mouth daily before breakfast.   Vitamin D 1000 units capsule Take 1,000 Units by mouth 2 (two) times daily.       Past Medical History:  Diagnosis Date  . Anemia   . Arthritis    osteoarthritis - shoulder, knees & hips  . Asthma   . Depression   . Diabetes mellitus without complication (HCC)    no meds  . Hypertension    Dr. Orinda [facility] manages BP, pt. reports MD has not found a need for treatment   . Hypothyroidism   . Sleep difficulties    had sleep study -2009, prior to gastric surgery, told that there was not a need for f/u  . Thyroid disease   . Vitamin B 12 deficiency     Past Surgical History:  Procedure Laterality Date  . ABDOMINAL HYSTERECTOMY    . BARIATRIC SURGERY    .  CARDIAC CATHETERIZATION     [facility]- [facility] yrs. [facility]  . OOPHORECTOMY    . pantallor arthrodesis with rod placement left foot    . TONSILLECTOMY    . TOTAL SHOULDER ARTHROPLASTY Left 03/12/2012   Procedure: TOTAL SHOULDER ARTHROPLASTY;  Surgeon: Marin Shutter, MD;  Location: [facility];  Service: Orthopedics;  Laterality: Left;    Allergies  Allergen Reactions  . Flexeril [Cyclobenzaprine]     On Pristiq  With possible serotonin reaction to flexeril  . Ace Inhibitors     States after a surgery she was told this. Not sure what the reason was.   . Azithromycin     REACTION: Rash  . Codeine     REACTION: Upset stomach    Review of systems negative  except as noted in HPI / PMHx or noted below:  Review of Systems  Constitutional: Negative.   HENT: Negative.   Eyes: Negative.   Respiratory: Negative.   Cardiovascular: Negative.   Gastrointestinal: Negative.   Genitourinary: Negative.   Musculoskeletal: Negative.   Skin: Negative.   Neurological: Negative.   Endo/Heme/Allergies: Negative.   Psychiatric/Behavioral: Negative.      Objective:   Vitals:   09/19/15 1059  BP: 126/86  Pulse: 72  Resp: 16          Physical Exam  Constitutional: She is well-developed, well-nourished, and in no distress.  HENT:  Head: Normocephalic.  Right Ear: Tympanic membrane, external ear and ear canal normal.  Left Ear: Tympanic membrane, external ear and ear canal normal.  Nose: Nose normal. No mucosal edema or rhinorrhea.  Mouth/Throat: Uvula is midline, oropharynx is clear and moist and mucous membranes are normal. No oropharyngeal exudate.  Eyes: Conjunctivae are normal.  Neck: Trachea normal. No tracheal tenderness present. No tracheal deviation present. No thyromegaly present.  Cardiovascular: Normal rate, regular rhythm, S1 normal, S2 normal and normal heart sounds.   No murmur heard. Pulmonary/Chest: Breath sounds normal. No stridor. No respiratory distress. She has no wheezes. She has no rales.  Musculoskeletal: She exhibits no edema.  Lymphadenopathy:       Head (right side): No tonsillar adenopathy present.       Head (left side): No tonsillar adenopathy present.    She has no cervical adenopathy.  Neurological: She is alert. Gait normal.  Skin: No rash noted. She is not diaphoretic. No erythema. Nails show no clubbing.  Psychiatric: Mood and affect normal.    Diagnostics: none    Assessment and Plan:   1. Asthma, mild intermittent, well-controlled   2. Allergic rhinoconjunctivitis   3. Dry eye syndrome, bilateral     1. Continue to perform Allergen avoidance measures  2. Continue to Treat and prevent  inflammation:   A. OTC Rhinocort/ flonase/ nasacort 1-2 spray each nostril one time per day.  B. start montelukast 10 mg one tablet one time per day  3. If needed:   A. try to avoid over-the-counter antihistamines  B. use OTC Systane 1 drop each eye multiple times a day  C. Can add Pazeo one drop each eye one time per day  C. Proventil HFA 2 puffs every 4-6 hours  4. Consider a course of Immunotherapy  5. Obtain a fall flu vaccine  6. Return to clinic in 6 months or earlier if problem  Wynonia appears to have continued problems with her eyes which are probably a combination of her atopic condition and her dry eye syndrome. I informed her that it would be best  for her not to use an over-the-counter antihistamine as this may exacerbate her dry eye syndrome. We will add a leukotriene modifier to replace her antihistamine and she can use over-the-counter Systane eyedrops and Pazeo if needed. She'll continue to use some form of nasal steroid as noted above and she can always start a course immunotherapy if she does not have a good response to this medical plan. She'll keep in contact with me noting her response and also her back in this clinic in 6 months or earlier if there is a problem.  Allena Katz, MD [facility]

## 2015-09-20 ENCOUNTER — Ambulatory Visit: Payer: Medicare Other | Admitting: Allergy and Immunology

## 2015-09-20 MED ORDER — MONTELUKAST SODIUM 10 MG PO TABS
10.0000 mg | ORAL_TABLET | Freq: Every day | ORAL | 5 refills | Status: DC
Start: 2015-09-20 — End: 2015-10-30

## 2015-09-20 MED ORDER — OLOPATADINE HCL 0.7 % OP SOLN: 1.0000 [drp] | OPHTHALMIC | 5 refills | Status: DC

## 2015-09-28 ENCOUNTER — Ambulatory Visit (INDEPENDENT_AMBULATORY_CARE_PROVIDER_SITE_OTHER): Payer: Medicare Other | Admitting: Family Medicine

## 2015-09-28 DIAGNOSIS — Z23 Encounter for immunization: Secondary | ICD-10-CM | POA: Diagnosis not present

## 2015-10-10 LAB — HM DIABETES EYE EXAM

## 2015-10-30 ENCOUNTER — Other Ambulatory Visit: Payer: Self-pay

## 2015-10-30 MED ORDER — MONTELUKAST SODIUM 10 MG PO TABS: 10.0000 mg | ORAL_TABLET | Freq: Every day | ORAL | 3 refills | Status: DC

## 2015-10-30 NOTE — Telephone Encounter (Signed)
SENT IN RX FOR 90 DAYS

## 2015-10-30 NOTE — Telephone Encounter (Signed)
Called pt to verify pharmacy. Left message to return call.

## 2015-10-30 NOTE — Telephone Encounter (Signed)
PT USES [facility] ON [address]

## 2015-10-30 NOTE — Telephone Encounter (Signed)
Patient last seen on 09/19/2015 by Dr. Neldon Mc. She would like her refill on Singulair sent in as a 90 day supply instead of 30 day.   Please Advise

## 2015-10-31 ENCOUNTER — Telehealth: Payer: Self-pay | Admitting: Allergy and Immunology

## 2015-10-31 ENCOUNTER — Telehealth: Payer: Self-pay

## 2015-10-31 NOTE — Telephone Encounter (Signed)
Pt states that pazeo is now over $100 is their another option you would like for her to try?  Please advise

## 2015-10-31 NOTE — Telephone Encounter (Signed)
SPOKE WITH PT AND INFORMED HER THAT WE SENT IN REFILLS YESTERDA

## 2015-10-31 NOTE — Telephone Encounter (Signed)
Patient called and said someone called her yesterday about her Montelukast 10 mg. She is returning the call.

## 2015-10-31 NOTE — Telephone Encounter (Signed)
Please inform patient achieved this option may be OTC [place] one drop each eye twice a day. Can try and let us know about response.a

## 2015-10-31 NOTE — Telephone Encounter (Signed)
Patient advised alternatives

## 2015-11-02 ENCOUNTER — Encounter: Payer: Self-pay | Admitting: Family Medicine

## 2015-11-03 ENCOUNTER — Encounter: Payer: Self-pay | Admitting: Family Medicine

## 2015-11-03 ENCOUNTER — Ambulatory Visit (INDEPENDENT_AMBULATORY_CARE_PROVIDER_SITE_OTHER): Payer: Medicare Other | Admitting: Family Medicine

## 2015-11-03 VITALS — BP 142/78 | HR 66 | Temp 98.2°F | Wt 227.2 lb

## 2015-11-03 DIAGNOSIS — N390 Urinary tract infection, site not specified: Secondary | ICD-10-CM | POA: Diagnosis not present

## 2015-11-03 LAB — POC URINALSYSI DIPSTICK (AUTOMATED)
BILIRUBIN UA: NEGATIVE
Blood, UA: NEGATIVE
Glucose, UA: NEGATIVE
KETONES UA: NEGATIVE
Nitrite, UA: NEGATIVE
PH UA: 5.5
Protein, UA: NEGATIVE
Urobilinogen, UA: 0.2

## 2015-11-03 MED ORDER — CEPHALEXIN 500 MG PO CAPS: 500.0000 mg | ORAL_CAPSULE | Freq: Three times a day (TID) | ORAL | 0 refills | Status: DC

## 2015-11-03 NOTE — Patient Instructions (Signed)
Looks like UTI  Treat with keflex for 7 days three times a day  Return if fever, worsening symptoms, symptoms do not improve  Should have culture results by Monday or so and will send mychart message

## 2015-11-03 NOTE — Progress Notes (Signed)
Subjective:  [name] is a 75 y.o. year old very pleasant female patient who presents for/with See problem oriented charting ROS- see any ROS included in HPI  Past Medical History-  Patient Active Problem List   Diagnosis Date Noted  . Hypercalcemia 12/29/2014    Priority: High  . Well controlled type 2 diabetes mellitus ([facility]) 07/18/2006    Priority: High  . Morbid obesity ([facility]) 06/28/2015    Priority: Medium  . History of gastric bypass 02/08/2014    Priority: Medium  . Hyperlipidemia 01/05/2008    Priority: Medium  . Unspecified hypothyroidism 09/09/2007    Priority: Medium  . Depression 07/18/2006    Priority: Medium  . Essential hypertension 07/18/2006    Priority: Medium  . ASTHMA 07/18/2006    Priority: Medium  . Allergic rhinitis 07/20/2014    Priority: Low  . Osteoarthritis 10/05/2013    Priority: Low  . CHEST WALL PAIN, ANTERIOR 10/09/2007    Priority: Low    Medications- reviewed and updated Current Outpatient Prescriptions  Medication Sig Dispense Refill  . albuterol (PROVENTIL HFA;VENTOLIN HFA) 108 (90 Base) MCG/ACT inhaler Inhale 1-2 puffs into the lungs every 4 (four) hours as needed for wheezing or shortness of breath. 1 Inhaler 6  . amLODipine (NORVASC) 10 MG tablet TAKE ONE TABLET BY MOUTH ONCE DAILY 90 tablet 3  . aspirin 81 MG tablet Take 81 mg by mouth daily.    Marland Kitchen atorvastatin (LIPITOR) 20 MG tablet Take 1 tablet (20 mg total) by mouth once a week. 13 tablet 3  . Cholecalciferol (VITAMIN D) 1000 UNITS capsule Take 5,000 Units by mouth 2 (two) times daily.     . cyanocobalamin (,VITAMIN B-12,) 1000 MCG/ML injection Inject 1 mL (1,000 mcg total) into the muscle every 30 (thirty) days. Uses on amonthly basis 10 mL 2  . Diclofenac Sodium CR 100 MG 24 hr tablet Take 1 tablet (100 mg total) by mouth daily. 90 tablet 1  . FeFum-FePo-FA-B Cmp-C-Zn-Mn-Cu (TANDEM PLUS) 162-115.2-1 MG CAPS TAKE ONE CAPSULE BY MOUTH TWICE DAILY 180 each 3  . fluticasone  (FLONASE ALLERGY RELIEF) 50 MCG/ACT nasal spray Place 1 spray into both nostrils daily.    . Levothyroxine Sodium (TIROSINT) 100 MCG CAPS Take 200 mcg by mouth daily before breakfast.     . montelukast (SINGULAIR) 10 MG tablet Take 1 tablet (10 mg total) by mouth at bedtime. 90 tablet 3  . Omega-3 Fatty Acids (FISH OIL) 1000 MG CAPS Take 1,000 mg by mouth 2 (two) times daily.     Marland Kitchen PRISTIQ 100 MG 24 hr tablet TAKE 1 BY MOUTH DAILY 90 tablet 3   Objective: BP (!) 142/78 (BP Location: Left Arm, Patient Position: Sitting, Cuff Size: Large)   Pulse 66   Temp 98.2 F (36.8 C) (Oral)   Wt 227 lb 3.2 oz (103.1 kg)   SpO2 97%   BMI 44.37 kg/m  Gen: NAD, resting comfortably CV: RRR no murmurs rubs or gallops Lungs: CTAB no crackles, wheeze, rhonchi Abdomen: soft/nontende including no suprapubic painr/nondistended/normal bowel sounds. Mildest of CVA tenderness.  No rebound or guarding.  Ext: no edema Skin: warm, dry, no rash over lower abdomen  Assessment/Plan:  UTI S: burning with urination. Some heaviness in pelvic area. Started 3 days ago, slightly better today but still present. Polyuria and urgency as well. Nocturia 2-3x a night at baseline- not worse with this.  ROS- no fever or chills. No suprapubic pain. Some CVA tenderness. No nausea or vomiting.  No vaginal discharage A/P: Urine culture ordered but treat empirically with keflex as well.   Orders Placed This Encounter  Procedures  . Urine culture    [facility]  . POCT Urinalysis Dipstick (Automated)    Meds ordered this encounter  Medications  . cephALEXin (KEFLEX) 500 MG capsule    Sig: Take 1 capsule (500 mg total) by mouth 3 (three) times daily.    Dispense:  21 capsule    Refill:  0    Return precautions advised.  Garret Reddish, MD

## 2015-11-03 NOTE — Progress Notes (Signed)
Pre visit review using our clinic review tool, if applicable. No additional management support is needed unless otherwise documented below in the visit note. 

## 2015-11-06 LAB — URINE CULTURE

## 2015-11-06 MED ORDER — LEVOFLOXACIN 250 MG PO TABS: 250.0000 mg | ORAL_TABLET | Freq: Every day | ORAL | 0 refills | Status: DC

## 2015-11-06 NOTE — Addendum Note (Signed)
Addended by: Marin Olp on: 11/06/2015 12:25 PM   Modules accepted: Orders

## 2015-12-11 ENCOUNTER — Encounter: Payer: Self-pay | Admitting: Family Medicine

## 2015-12-20 ENCOUNTER — Ambulatory Visit: Payer: Medicare Other | Admitting: Family Medicine

## 2015-12-21 ENCOUNTER — Ambulatory Visit: Payer: Medicare Other | Admitting: Family Medicine

## 2015-12-22 ENCOUNTER — Ambulatory Visit: Payer: Medicare Other | Admitting: Family Medicine

## 2016-01-01 ENCOUNTER — Encounter: Payer: Self-pay | Admitting: Family Medicine

## 2016-01-04 ENCOUNTER — Encounter: Payer: Self-pay | Admitting: Family Medicine

## 2016-01-04 ENCOUNTER — Ambulatory Visit (INDEPENDENT_AMBULATORY_CARE_PROVIDER_SITE_OTHER): Payer: Medicare Other | Admitting: Family Medicine

## 2016-01-04 DIAGNOSIS — E119 Type 2 diabetes mellitus without complications: Secondary | ICD-10-CM | POA: Diagnosis not present

## 2016-01-04 DIAGNOSIS — I10 Essential (primary) hypertension: Secondary | ICD-10-CM | POA: Diagnosis not present

## 2016-01-04 DIAGNOSIS — M159 Polyosteoarthritis, unspecified: Secondary | ICD-10-CM

## 2016-01-04 DIAGNOSIS — F324 Major depressive disorder, single episode, in partial remission: Secondary | ICD-10-CM

## 2016-01-04 DIAGNOSIS — E785 Hyperlipidemia, unspecified: Secondary | ICD-10-CM | POA: Diagnosis not present

## 2016-01-04 LAB — COMPREHENSIVE METABOLIC PANEL
ALBUMIN: 4.2 g/dL (ref 3.5–5.2)
ALK PHOS: 110 U/L (ref 39–117)
ALT: 18 U/L (ref 0–35)
AST: 21 U/L (ref 0–37)
BILIRUBIN TOTAL: 0.5 mg/dL (ref 0.2–1.2)
BUN: 11 mg/dL (ref 6–23)
CO2: 30 mEq/L (ref 19–32)
Calcium: 10.1 mg/dL (ref 8.4–10.5)
Chloride: 103 mEq/L (ref 96–112)
Creatinine, Ser: 0.81 mg/dL (ref 0.40–1.20)
GFR: 73.11 mL/min (ref >60.00)
GLUCOSE: 101 mg/dL — AB (ref 70–99)
POTASSIUM: 4.1 meq/L (ref 3.5–5.1)
Sodium: 138 mEq/L (ref 135–145)
TOTAL PROTEIN: 6.7 g/dL (ref 6.0–8.3)

## 2016-01-04 LAB — LDL CHOLESTEROL, DIRECT: Direct LDL: 109 mg/dL

## 2016-01-04 LAB — MICROALBUMIN / CREATININE URINE RATIO
Creatinine,U: 44.3 mg/dL
Microalb Creat Ratio: 1.6 mg/g (ref 0.0–30.0)
Microalb, Ur: <0.7 mg/dL (ref 0.0–1.9)

## 2016-01-04 LAB — HEMOGLOBIN A1C: HEMOGLOBIN A1C: 6.4 % (ref 4.6–6.5)

## 2016-01-04 MED ORDER — GLUCOSE BLOOD VI STRP: ORAL_STRIP | 12 refills | Status: DC

## 2016-01-04 MED ORDER — HYDROCODONE-ACETAMINOPHEN 5-325 MG PO TABS: 1.0000 | ORAL_TABLET | Freq: Four times a day (QID) | ORAL | 0 refills | Status: DC | PRN

## 2016-01-04 MED ORDER — ONETOUCH ULTRASOFT LANCETS MISC: 12 refills | Status: DC

## 2016-01-04 NOTE — Assessment & Plan Note (Signed)
Encouraged weight loss- low motivation. Exercise limited by pain

## 2016-01-04 NOTE — Assessment & Plan Note (Signed)
S: controlled  in office on amlodipine 10mg . bystolic option likely (is asthmatic bu tlimitations on other bp meds as well) BP Readings from Last 3 Encounters:  01/04/16 118/82  11/03/15 (!) 142/78  09/19/15 126/86  A/P:Continue current meds:  Thankful improved due to limited options for treatment

## 2016-01-04 NOTE — Assessment & Plan Note (Signed)
S: reasonable control on pristiq. No SI. Being around friends really helps her. expensive $500/90 days when in donut hole. Failed other ssri and snri in pst A/P: wants to switch but this is best she has been controlled- did not do well with cymbalta (poor control) or effexor (poor control). May not be covered next year- would need to submit prior authorization

## 2016-01-04 NOTE — Assessment & Plan Note (Signed)
S: suspect improved control on atorvastatin 20mg  ocne a week with mild myalgias.  A/P: update LDL today- hopeful at least under 100 but do not think we can push dose

## 2016-01-04 NOTE — Patient Instructions (Addendum)
Trial vicodin sparingly for back pain and knee pain- hopefully once or twie a week or less. Would consider injections in the future if need for medicine increases  Stick with pristiq given other options havent worked- will fill out prior auth when we receive it  Blood pressure looks better  Labs before you leave

## 2016-01-04 NOTE — Assessment & Plan Note (Signed)
S: Knees-voltaren (not highly advisable with bypass and CAD risk factors). Also continued. Back pain-fusion. Shoulder-total shoulder repair. She usually does well with voltaren alone (will get bmet) but has times where really needs something for breakthrough. Hydrocodone and oxycodone helped in past. Steroid shots help 3 months but she is worried about diabetes and other long term effects of steroids. Tramadol does not help A/P: I agreed to trial vicodin but only 1-2 doses per week- she is in agreement (#20 given today). Continue voltaren but monitor bmet

## 2016-01-04 NOTE — Progress Notes (Signed)
Pre visit review using our clinic review tool, if applicable. No additional management support is needed unless otherwise documented below in the visit note. 

## 2016-01-04 NOTE — Assessment & Plan Note (Signed)
S: well controlled. On no medication after gastric bypass Lab Results  Component Value Date   HGBA1C 6.3 06/28/2015   HGBA1C 6.6 (H) 12/29/2014   HGBA1C 6.9 (H) 07/20/2014   A/P: update a1c, urine microalbumin today. Given new one touch meter and Jamie to send in strips lancets. She checks intermittently to help with motivation

## 2016-01-04 NOTE — Progress Notes (Signed)
Subjective:  [name] is a 75 y.o. year old very pleasant female patient who presents for/with See problem oriented charting ROS- chronic knee and back pain. No chest pain. No hypoglycemia.   Past Medical History-  Patient Active Problem List   Diagnosis Date Noted  . Hypercalcemia 12/29/2014    Priority: High  . Well controlled type 2 diabetes mellitus ([place]) 07/18/2006    Priority: High  . Morbid obesity ([place]) 06/28/2015    Priority: Medium  . History of gastric bypass 02/08/2014    Priority: Medium  . Hyperlipidemia 01/05/2008    Priority: Medium  . Unspecified hypothyroidism 09/09/2007    Priority: Medium  . Depression 07/18/2006    Priority: Medium  . Essential hypertension 07/18/2006    Priority: Medium  . ASTHMA 07/18/2006    Priority: Medium  . Allergic rhinitis 07/20/2014    Priority: Low  . Osteoarthritis 10/05/2013    Priority: Low  . CHEST WALL PAIN, ANTERIOR 10/09/2007    Priority: Low    Medications- reviewed and updated Current Outpatient Prescriptions  Medication Sig Dispense Refill  . albuterol (PROVENTIL HFA;VENTOLIN HFA) 108 (90 Base) MCG/ACT inhaler Inhale 1-2 puffs into the lungs every 4 (four) hours as needed for wheezing or shortness of breath. 1 Inhaler 6  . amLODipine (NORVASC) 10 MG tablet TAKE ONE TABLET BY MOUTH ONCE DAILY 90 tablet 3  . aspirin 81 MG tablet Take 81 mg by mouth daily.    Marland Kitchen atorvastatin (LIPITOR) 20 MG tablet Take 1 tablet (20 mg total) by mouth once a week. 13 tablet 3  . Cholecalciferol (VITAMIN D) 1000 UNITS capsule Take 5,000 Units by mouth 2 (two) times daily.     . cyanocobalamin (,VITAMIN B-12,) 1000 MCG/ML injection Inject 1 mL (1,000 mcg total) into the muscle every 30 (thirty) days. Uses on amonthly basis 10 mL 2  . Diclofenac Sodium CR 100 MG 24 hr tablet Take 1 tablet (100 mg total) by mouth daily. 90 tablet 1  . FeFum-FePo-FA-B Cmp-C-Zn-Mn-Cu (TANDEM PLUS) 162-115.2-1 MG CAPS TAKE ONE CAPSULE BY MOUTH TWICE  DAILY 180 each 3  . fluticasone (FLONASE ALLERGY RELIEF) 50 MCG/ACT nasal spray Place 1 spray into both nostrils daily.    . Levothyroxine Sodium (TIROSINT) 100 MCG CAPS Take 200 mcg by mouth daily before breakfast.     . montelukast (SINGULAIR) 10 MG tablet Take 1 tablet (10 mg total) by mouth at bedtime. 90 tablet 3  . Omega-3 Fatty Acids (FISH OIL) 1000 MG CAPS Take 1,000 mg by mouth 2 (two) times daily.     Marland Kitchen PRISTIQ 100 MG 24 hr tablet TAKE 1 BY MOUTH DAILY 90 tablet 3  . glucose blood test strip Test blood sugar once a day E11.9 100 each 12  . HYDROcodone-acetaminophen (NORCO/VICODIN) 5-325 MG tablet Take 1 tablet by mouth every 6 (six) hours as needed. 20 tablet 0  . Lancets (ONETOUCH ULTRASOFT) lancets Use to test blood sugar once a day. E11.9 100 each 12  . TIROSINT 150 MCG CAPS TK 2 CS PO 2 OUT OF 3 DAYS IN THE MORNING ON EMPTY STOMACH FOR THYROID  5   No current facility-administered medications for this visit.     Objective: BP 118/82 (BP Location: Left Arm, Patient Position: Sitting, Cuff Size: Large)   Pulse 63   Temp 98.2 F (36.8 C) (Oral)   Ht 5' (1.524 m)   Wt 227 lb 12.8 oz (103.3 kg)   SpO2 98%   BMI 44.49 kg/m  Gen: NAD, resting comfortably CV: RRR no murmurs rubs or gallops Lungs: CTAB no crackles, wheeze, rhonchi Abdomen: soft/nontender/nondistended/normal bowel sounds. No rebound or guarding. Morbid obesity  Ext: trace edema Skin: warm, dry  Diabetic Foot Exam - Simple   Simple Foot Form Visual Inspection No deformities, no ulcerations, no other skin breakdown bilaterally:  Yes Sensation Testing Intact to touch and monofilament testing bilaterally:  Yes Pulse Check Posterior Tibialis and Dorsalis pulse intact bilaterally:  Yes Comments Decreased sensation left foot near medial portion (from prior surgery). Some dryness    Assessment/Plan:  Hyperlipidemia S: suspect improved control on atorvastatin 20mg  ocne a week with mild myalgias.  A/P: update  LDL today- hopeful at least under 100 but do not think we can push dose  Essential hypertension S: controlled  in office on amlodipine 10mg . bystolic option likely (is asthmatic bu tlimitations on other bp meds as well) BP Readings from Last 3 Encounters:  01/04/16 118/82  11/03/15 (!) 142/78  09/19/15 126/86  A/P:Continue current meds:  Thankful improved due to limited options for treatment  Morbid obesity ([place]) Encouraged weight loss- low motivation. Exercise limited by pain  Depression S: reasonable control on pristiq. No SI. Being around friends really helps her. expensive $500/90 days when in donut hole. Failed other ssri and snri in pst A/P: wants to switch but this is best she has been controlled- did not do well with cymbalta (poor control) or effexor (poor control). May not be covered next year- would need to submit prior authorization  Well controlled type 2 diabetes mellitus ([place]) S: well controlled. On no medication after gastric bypass Lab Results  Component Value Date   HGBA1C 6.3 06/28/2015   HGBA1C 6.6 (H) 12/29/2014   HGBA1C 6.9 (H) 07/20/2014   A/P: update a1c, urine microalbumin today. Given new one touch meter and Jamie to send in strips lancets. She checks intermittently to help with motivation  Osteoarthritis S: Knees-voltaren (not highly advisable with bypass and CAD risk factors). Also continued. Back pain-fusion. Shoulder-total shoulder repair. She usually does well with voltaren alone (will get bmet) but has times where really needs something for breakthrough. Hydrocodone and oxycodone helped in past. Steroid shots help 3 months but she is worried about diabetes and other long term effects of steroids. Tramadol does not help A/P: I agreed to trial vicodin but only 1-2 doses per week- she is in agreement (#20 given today). Continue voltaren but monitor bmet  14 weeks or so  Orders Placed This Encounter  Procedures  . CBC    [place]  . Comprehensive  metabolic panel    [place]  . Hemoglobin A1c    [place]  . LDL cholesterol, direct    [place]  . Microalbumin / creatinine urine ratio    [place]    Meds ordered this encounter  Medications  . TIROSINT 150 MCG CAPS    Sig: TK 2 CS PO 2 OUT OF 3 DAYS IN THE MORNING ON EMPTY STOMACH FOR THYROID    Refill:  5  . HYDROcodone-acetaminophen (NORCO/VICODIN) 5-325 MG tablet    Sig: Take 1 tablet by mouth every 6 (six) hours as needed.    Dispense:  20 tablet    Refill:  0  . glucose blood test strip    Sig: Test blood sugar once a day E11.9    Dispense:  100 each    Refill:  12  . Lancets (ONETOUCH ULTRASOFT) lancets    Sig: Use to test blood sugar once  a day. E11.9    Dispense:  100 each    Refill:  12    Return precautions advised.  Garret Reddish, MD

## 2016-01-05 LAB — CBC
HEMATOCRIT: 40.9 % (ref 36.0–46.0)
HEMOGLOBIN: 13.6 g/dL (ref 12.0–15.0)
MCHC: 33.3 g/dL (ref 30.0–36.0)
MCV: 87.7 fl (ref 78.0–100.0)
Platelets: 197 10*3/uL (ref 150.0–400.0)
RBC: 4.66 Mil/uL (ref 3.87–5.11)
RDW: 13.9 % (ref 11.5–15.5)
WBC: 6.3 10*3/uL (ref 4.0–10.5)

## 2016-01-31 ENCOUNTER — Encounter: Payer: Self-pay | Admitting: Family Medicine

## 2016-02-01 ENCOUNTER — Other Ambulatory Visit: Payer: Self-pay

## 2016-02-01 ENCOUNTER — Encounter: Payer: Self-pay | Admitting: Family Medicine

## 2016-02-01 DIAGNOSIS — M25551 Pain in right hip: Secondary | ICD-10-CM

## 2016-02-01 DIAGNOSIS — M545 Low back pain: Secondary | ICD-10-CM

## 2016-02-01 DIAGNOSIS — M25552 Pain in left hip: Secondary | ICD-10-CM

## 2016-02-08 ENCOUNTER — Ambulatory Visit: Payer: Medicare Other

## 2016-02-12 ENCOUNTER — Other Ambulatory Visit: Payer: Self-pay

## 2016-02-12 MED ORDER — DICLOFENAC SODIUM ER 100 MG PO TB24: 100.0000 mg | ORAL_TABLET | Freq: Every day | ORAL | 1 refills | Status: DC

## 2016-02-15 ENCOUNTER — Ambulatory Visit: Payer: Medicare Other | Admitting: Physical Therapy

## 2016-03-20 ENCOUNTER — Ambulatory Visit (INDEPENDENT_AMBULATORY_CARE_PROVIDER_SITE_OTHER): Payer: Medicare Other | Admitting: Allergy and Immunology

## 2016-03-20 VITALS — BP 140/80 | HR 70 | Temp 97.7°F | Resp 18 | Ht 61.0 in | Wt 237.0 lb

## 2016-03-20 DIAGNOSIS — H04123 Dry eye syndrome of bilateral lacrimal glands: Secondary | ICD-10-CM

## 2016-03-20 DIAGNOSIS — J4520 Mild intermittent asthma, uncomplicated: Secondary | ICD-10-CM | POA: Diagnosis not present

## 2016-03-20 DIAGNOSIS — J3089 Other allergic rhinitis: Secondary | ICD-10-CM

## 2016-03-20 NOTE — Patient Instructions (Signed)
  1. Continue to perform Allergen avoidance measures  2. Continue to Treat and prevent inflammation:   A. OTC flonase 1-2 spray each nostril one time per day.  B. montelukast 10 mg one tablet one time per day  3. If needed:   A. Can use OTC nasal saline multiple times per day  B. use OTC Systane 1 drop each eye multiple times a day  C. Can add generic patanol 1 drop each eye two times per day  C. Proventil HFA 2 puffs every 4-6 hours  4.Return to clinic in 6 months or earlier if problem

## 2016-03-20 NOTE — Progress Notes (Signed)
Follow-up Note  Referring Provider: Marin Olp, MD Primary Provider: Garret Reddish, MD Date of Office Visit: 03/20/2016  Subjective:   [name] (DOB: 12/24/1940) is a 76 y.o. female who returns to [facility] on 03/20/2016 in re-evaluation of the following:  HPI: [name] returns to this clinic in reevaluation of her mild intermittent asthma and allergic rhinoconjunctivitis and dry eye syndrome. I last saw her in this clinic in August 2017.  She has done relatively well. She has not required a systemic steroid or an antibiotic to treat her respiratory tract issue. Her requirement for a short acting bronchodilator is 0. She does not really exert herself to any degree.  Her nose is doing relatively well although occasionally her nose feels as though it is very dry. Likewise, she still continues to have some intermittent issues with itchiness and dryness of her eyes. She is no longer using an oral antihistamine but does continue to consistently use Flonase and montelukast. She could not afford the [place] eyedrop that I gave her previously although it did work very well.  Allergies as of 03/20/2016      Reactions   Flexeril [cyclobenzaprine]    On Pristiq  With possible serotonin reaction to flexeril   Ace Inhibitors    States after a surgery she was told this. Not sure what the reason was.    Azithromycin    REACTION: Rash   Codeine    REACTION: Upset stomach      Medication List      albuterol 108 (90 Base) MCG/ACT inhaler Commonly known as:  PROVENTIL HFA;VENTOLIN HFA Inhale 1-2 puffs into the lungs every 4 (four) hours as needed for wheezing or shortness of breath.   amLODipine 10 MG tablet Commonly known as:  NORVASC TAKE ONE TABLET BY MOUTH ONCE DAILY   aspirin 81 MG tablet Take 81 mg by mouth daily.   atorvastatin 20 MG tablet Commonly known as:  LIPITOR Take 1 tablet (20 mg total) by mouth once a week.   cyanocobalamin 1000  MCG/ML injection Commonly known as:  (VITAMIN B-12) Inject 1 mL (1,000 mcg total) into the muscle every 30 (thirty) days. Uses on amonthly basis   Diclofenac Sodium CR 100 MG 24 hr tablet Take 1 tablet (100 mg total) by mouth daily.   Fish Oil 1000 MG Caps Take 1,000 mg by mouth 2 (two) times daily.   FLONASE ALLERGY RELIEF 50 MCG/ACT nasal spray Generic drug:  fluticasone Place 1 spray into both nostrils daily.   glucose blood test strip Test blood sugar once a day E11.9   HYDROcodone-acetaminophen 5-325 MG tablet Commonly known as:  NORCO/VICODIN Take 1 tablet by mouth every 6 (six) hours as needed.   montelukast 10 MG tablet Commonly known as:  SINGULAIR Take 1 tablet (10 mg total) by mouth at bedtime.   onetouch ultrasoft lancets Use to test blood sugar once a day. E11.9   PRISTIQ 100 MG 24 hr tablet Generic drug:  desvenlafaxine TAKE 1 BY MOUTH DAILY   TANDEM PLUS 162-115.2-1 MG Caps TAKE ONE CAPSULE BY MOUTH TWICE DAILY   TIROSINT 150 MCG Caps Generic drug:  Levothyroxine Sodium TK 2 CS PO 2 OUT OF 3 DAYS IN THE MORNING ON EMPTY STOMACH FOR THYROID   Vitamin D 1000 units capsule Take 5,000 Units by mouth daily.       Past Medical History:  Diagnosis Date  . Anemia   . Arthritis  osteoarthritis - shoulder, knees & hips  . Asthma   . Depression   . Diabetes mellitus without complication (HCC)    no meds  . Hypertension    Dr. Orinda Kenner manages BP, pt. reports MD has not found a need for treatment   . Hypothyroidism   . Sleep difficulties    had sleep study -2009, prior to gastric surgery, told that there was not a need for f/u  . Thyroid disease   . Vitamin B 12 deficiency     Past Surgical History:  Procedure Laterality Date  . ABDOMINAL HYSTERECTOMY    . BARIATRIC SURGERY    . CARDIAC CATHETERIZATION     [facility]- 30 yrs. ago  . OOPHORECTOMY    . pantallor arthrodesis with rod placement left foot    . TONSILLECTOMY    . TOTAL SHOULDER  ARTHROPLASTY Left 03/12/2012   Procedure: TOTAL SHOULDER ARTHROPLASTY;  Surgeon: Marin Shutter, MD;  Location: [facility];  Service: Orthopedics;  Laterality: Left;    Review of systems negative except as noted in HPI / PMHx or noted below:  Review of Systems  Constitutional: Negative.   HENT: Negative.   Eyes: Negative.   Respiratory: Negative.   Cardiovascular: Negative.   Gastrointestinal: Negative.   Genitourinary: Negative.   Musculoskeletal: Negative.   Skin: Negative.   Neurological: Negative.   Endo/Heme/Allergies: Negative.   Psychiatric/Behavioral: Negative.      Objective:   Vitals:   03/20/16 1009  BP: 140/80  Pulse: 70  Resp: 18  Temp: 97.7 F (36.5 C)   Height: 5\' 1"  (154.9 cm)  Weight: 237 lb (107.5 kg)   Physical Exam  Constitutional: She is well-developed, well-nourished, and in no distress.  HENT:  Head: Normocephalic.  Right Ear: External ear and ear canal normal.  Left Ear: External ear and ear canal normal.  Nose: Nose normal. No mucosal edema or rhinorrhea.  Mouth/Throat: Uvula is midline, oropharynx is clear and moist and mucous membranes are normal. No oropharyngeal exudate.  Bilateral occlusion of external auditory canals with wax  Eyes: Conjunctivae are normal.  Neck: Trachea normal. No tracheal tenderness present. No tracheal deviation present. No thyromegaly present.  Cardiovascular: Normal rate, regular rhythm, S1 normal, S2 normal and normal heart sounds.   No murmur heard. Pulmonary/Chest: Breath sounds normal. No stridor. No respiratory distress. She has no wheezes. She has no rales.  Musculoskeletal: She exhibits no edema.  Lymphadenopathy:       Head (right side): No tonsillar adenopathy present.       Head (left side): No tonsillar adenopathy present.    She has no cervical adenopathy.  Neurological: She is alert. Gait normal.  Skin: No rash noted. She is not diaphoretic. No erythema. Nails show no clubbing.  Psychiatric: Mood and  affect normal.    Diagnostics:    Spirometry was performed and demonstrated an FEV1 of 1.82 at 101 % of predicted.    Assessment and Plan:   1. Asthma, mild intermittent, well-controlled   2. Other allergic rhinitis   3. Dry eye syndrome, bilateral     1. Continue to perform Allergen avoidance measures  2. Continue to Treat and prevent inflammation:   A. OTC flonase 1-2 spray each nostril one time per day.  B. montelukast 10 mg one tablet one time per day  3. If needed:   A. Can use OTC nasal saline multiple times per day  B. use OTC Systane 1 drop each eye multiple times a  day  C. Can add generic patanol 1 drop each eye two times per day  C. Proventil HFA 2 puffs every 4-6 hours  4.Return to clinic in 6 months or earlier if problem  [name] appears to be doing relatively well and I will continue to have her use Flonase and montelukast consistently and I give her prescription for generic Patanol and she can use some nasal saline to address the issues with her eyes and dry nose. If she does well I will see her back in this clinic in 6 months or earlier. Certainly if there is a problem with asthma as she moves forward she will contact me for further evaluation and treatment.  Allena Katz, MD Allergy / Immunology [place]

## 2016-03-21 ENCOUNTER — Encounter: Payer: Self-pay | Admitting: Allergy and Immunology

## 2016-04-22 ENCOUNTER — Other Ambulatory Visit: Payer: Self-pay | Admitting: Family Medicine

## 2016-05-02 ENCOUNTER — Ambulatory Visit (INDEPENDENT_AMBULATORY_CARE_PROVIDER_SITE_OTHER): Payer: Medicare Other

## 2016-05-02 VITALS — BP 130/80 | HR 65 | Ht 62.0 in | Wt 230.0 lb

## 2016-05-02 DIAGNOSIS — Z0000 Encounter for general adult medical examination without abnormal findings: Secondary | ICD-10-CM

## 2016-05-02 NOTE — Patient Instructions (Addendum)
[name] , Thank you for taking time to come for your Medicare Wellness Visit. I appreciate your ongoing commitment to your health goals. Please review the following plan we discussed and let me know if I can assist you in the future.   For more information contact Zara Chess at [phone] or email [email or web address].  [facility]  Address: [address], [place], [place] [address]  [phone]   These are the goals we discussed: Goals    . Exercise 150 minutes per week (moderate activity)          Will explore tai chi or yoga       This is a list of the screening recommended for you and due dates:  Health Maintenance  Topic Date Due  . Hemoglobin A1C  07/04/2016  . Flu Shot  08/21/2016  . Eye exam for diabetics  10/09/2016  . Complete foot exam   01/03/2017  . Urine Protein Check  01/03/2017  . Tetanus Vaccine  03/02/2023  . DEXA scan (bone density measurement)  Completed  . Pneumonia vaccines  Completed   Health Maintenance, Female Adopting a healthy lifestyle and getting preventive care can go a long way to promote health and wellness. Talk with your health care provider about what schedule of regular examinations is right for you. This is a good chance for you to check in with your provider about disease prevention and staying healthy. In between checkups, there are plenty of things you can do on your own. Experts have done a lot of research about which lifestyle changes and preventive measures are most likely to keep you healthy. Ask your health care provider for more information. Weight and diet Eat a healthy diet  Be sure to include plenty of vegetables, fruits, low-fat dairy products, and lean protein.  Do not eat a lot of foods high in solid fats, added sugars, or salt.  Get regular exercise. This is one of the most important things you can do for your health.  Most adults  should exercise for at least 150 minutes each week. The exercise should increase your heart rate and make you sweat (moderate-intensity exercise).  Most adults should also do strengthening exercises at least twice a week. This is in addition to the moderate-intensity exercise. Maintain a healthy weight  Body mass index (BMI) is a measurement that can be used to identify possible weight problems. It estimates body fat based on height and weight. Your health care provider can help determine your BMI and help you achieve or maintain a healthy weight.  For females 72 years of age and older:  A BMI below 18.5 is considered underweight.  A BMI of 18.5 to 24.9 is normal.  A BMI of 25 to 29.9 is considered overweight.  A BMI of 30 and above is considered obese. Watch levels of cholesterol and blood lipids  You should start having your blood tested for lipids and cholesterol at 76 years of age, then have this test every 5 years.  You may need to have your cholesterol levels checked more often if:  Your lipid or cholesterol levels are high.  You are older than 76 years of age.  You are at high risk for heart disease. Cancer screening Lung Cancer  Lung cancer screening is recommended for adults 65-4 years old who are at high risk for lung cancer because of a history of smoking.  A yearly low-dose CT scan  of the lungs is recommended for people who:  Currently smoke.  Have quit within the past 15 years.  Have at least a 30-pack-year history of smoking. A pack year is smoking an average of one pack of cigarettes a day for 1 year.  Yearly screening should continue until it has been 15 years since you quit.  Yearly screening should stop if you develop a health problem that would prevent you from having lung cancer treatment. Breast Cancer  Practice breast self-awareness. This means understanding how your breasts normally appear and feel.  It also means doing regular breast self-exams.  Let your health care provider know about any changes, no matter how small.  If you are in your 20s or 30s, you should have a clinical breast exam (CBE) by a health care provider every 1-3 years as part of a regular health exam.  If you are 48 or older, have a CBE every year. Also consider having a breast X-ray (mammogram) every year.  If you have a family history of breast cancer, talk to your health care provider about genetic screening.  If you are at high risk for breast cancer, talk to your health care provider about having an MRI and a mammogram every year.  Breast cancer gene (BRCA) assessment is recommended for women who have family members with BRCA-related cancers. BRCA-related cancers include:  Breast.  Ovarian.  Tubal.  Peritoneal cancers.  Results of the assessment will determine the need for genetic counseling and BRCA1 and BRCA2 testing. Cervical Cancer  Your health care provider may recommend that you be screened regularly for cancer of the pelvic organs (ovaries, uterus, and vagina). This screening involves a pelvic examination, including checking for microscopic changes to the surface of your cervix (Pap test). You may be encouraged to have this screening done every 3 years, beginning at age 52.  For women ages 21-65, health care providers may recommend pelvic exams and Pap testing every 3 years, or they may recommend the Pap and pelvic exam, combined with testing for human papilloma virus (HPV), every 5 years. Some types of HPV increase your risk of cervical cancer. Testing for HPV may also be done on women of any age with unclear Pap test results.  Other health care providers may not recommend any screening for nonpregnant women who are considered low risk for pelvic cancer and who do not have symptoms. Ask your health care provider if a screening pelvic exam is right for you.  If you have had past treatment for cervical cancer or a condition that could lead to cancer,  you need Pap tests and screening for cancer for at least 20 years after your treatment. If Pap tests have been discontinued, your risk factors (such as having a new sexual partner) need to be reassessed to determine if screening should resume. Some women have medical problems that increase the chance of getting cervical cancer. In these cases, your health care provider may recommend more frequent screening and Pap tests. Colorectal Cancer  This type of cancer can be detected and often prevented.  Routine colorectal cancer screening usually begins at 76 years of age and continues through 76 years of age.  Your health care provider may recommend screening at an earlier age if you have risk factors for colon cancer.  Your health care provider may also recommend using home test kits to check for hidden blood in the stool.  A small camera at the end of a tube can be used to  examine your colon directly (sigmoidoscopy or colonoscopy). This is done to check for the earliest forms of colorectal cancer.  Routine screening usually begins at age 67.  Direct examination of the colon should be repeated every 5-10 years through 76 years of age. However, you may need to be screened more often if early forms of precancerous polyps or small growths are found. Skin Cancer  Check your skin from head to toe regularly.  Tell your health care provider about any new moles or changes in moles, especially if there is a change in a mole's shape or color.  Also tell your health care provider if you have a mole that is larger than the size of a pencil eraser.  Always use sunscreen. Apply sunscreen liberally and repeatedly throughout the day.  Protect yourself by wearing long sleeves, pants, a wide-brimmed hat, and sunglasses whenever you are outside. Heart disease, diabetes, and high blood pressure  High blood pressure causes heart disease and increases the risk of stroke. High blood pressure is more likely to  develop in:  People who have blood pressure in the high end of the normal range (130-139/85-89 mm Hg).  People who are overweight or obese.  People who are African American.  If you are 25-19 years of age, have your blood pressure checked every 3-5 years. If you are 79 years of age or older, have your blood pressure checked every year. You should have your blood pressure measured twice-once when you are at a hospital or clinic, and once when you are not at a hospital or clinic. Record the average of the two measurements. To check your blood pressure when you are not at a hospital or clinic, you can use:  An automated blood pressure machine at a pharmacy.  A home blood pressure monitor.  If you are between 58 years and 73 years old, ask your health care provider if you should take aspirin to prevent strokes.  Have regular diabetes screenings. This involves taking a blood sample to check your fasting blood sugar level.  If you are at a normal weight and have a low risk for diabetes, have this test once every three years after 76 years of age.  If you are overweight and have a high risk for diabetes, consider being tested at a younger age or more often. Preventing infection Hepatitis B  If you have a higher risk for hepatitis B, you should be screened for this virus. You are considered at high risk for hepatitis B if:  You were born in a country where hepatitis B is common. Ask your health care provider which countries are considered high risk.  Your parents were born in a high-risk country, and you have not been immunized against hepatitis B (hepatitis B vaccine).  You have HIV or AIDS.  You use needles to inject street drugs.  You live with someone who has hepatitis B.  You have had sex with someone who has hepatitis B.  You get hemodialysis treatment.  You take certain medicines for conditions, including cancer, organ transplantation, and autoimmune conditions. Hepatitis  C  Blood testing is recommended for:  Everyone born from [birth year] through 1965.  Anyone with known risk factors for hepatitis C. Sexually transmitted infections (STIs)  You should be screened for sexually transmitted infections (STIs) including gonorrhea and chlamydia if:  You are sexually active and are younger than 76 years of age.  You are older than 76 years of age and your health care provider  tells you that you are at risk for this type of infection.  Your sexual activity has changed since you were last screened and you are at an increased risk for chlamydia or gonorrhea. Ask your health care provider if you are at risk.  If you do not have HIV, but are at risk, it may be recommended that you take a prescription medicine daily to prevent HIV infection. This is called pre-exposure prophylaxis (PrEP). You are considered at risk if:  You are sexually active and do not regularly use condoms or know the HIV status of your partner(s).  You take drugs by injection.  You are sexually active with a partner who has HIV. Talk with your health care provider about whether you are at high risk of being infected with HIV. If you choose to begin PrEP, you should first be tested for HIV. You should then be tested every 3 months for as long as you are taking PrEP. Pregnancy  If you are premenopausal and you may become pregnant, ask your health care provider about preconception counseling.  If you may become pregnant, take 400 to 800 micrograms (mcg) of folic acid every day.  If you want to prevent pregnancy, talk to your health care provider about birth control (contraception). Osteoporosis and menopause  Osteoporosis is a disease in which the bones lose minerals and strength with aging. This can result in serious bone fractures. Your risk for osteoporosis can be identified using a bone density scan.  If you are 73 years of age or older, or if you are at risk for osteoporosis and fractures, ask  your health care provider if you should be screened.  Ask your health care provider whether you should take a calcium or vitamin D supplement to lower your risk for osteoporosis.  Menopause may have certain physical symptoms and risks.  Hormone replacement therapy may reduce some of these symptoms and risks. Talk to your health care provider about whether hormone replacement therapy is right for you. Follow these instructions at home:  Schedule regular health, dental, and eye exams.  Stay current with your immunizations.  Do not use any tobacco products including cigarettes, chewing tobacco, or electronic cigarettes.  If you are pregnant, do not drink alcohol.  If you are breastfeeding, limit how much and how often you drink alcohol.  Limit alcohol intake to no more than 1 drink per day for nonpregnant women. One drink equals 12 ounces of beer, 5 ounces of wine, or 1 ounces of hard liquor.  Do not use street drugs.  Do not share needles.  Ask your health care provider for help if you need support or information about quitting drugs.  Tell your health care provider if you often feel depressed.  Tell your health care provider if you have ever been abused or do not feel safe at home. This information is not intended to replace advice given to you by your health care provider. Make sure you discuss any questions you have with your health care provider. Document Released: 07/23/2010 Document Revised: 06/15/2015 Document Reviewed: 10/11/2014 Elsevier Interactive Patient Education  2017 [place] Prevention in the Home Falls can cause injuries and can affect people from all age groups. There are many simple things that you can do to make your home safe and to help prevent falls. What can I do on the outside of my home?  Regularly repair the edges of walkways and driveways and fix any cracks.  Remove  high doorway thresholds.  Trim any shrubbery on the main path into  your home.  Use bright outdoor lighting.  Clear walkways of debris and clutter, including tools and rocks.  Regularly check that handrails are securely fastened and in good repair. Both sides of any steps should have handrails.  Install guardrails along the edges of any raised decks or porches.  Have leaves, snow, and ice cleared regularly.  Use sand or salt on walkways during winter months.  In the garage, clean up any spills right away, including grease or oil spills. What can I do in the bathroom?  Use night lights.  Install grab bars by the toilet and in the tub and shower. Do not use towel bars as grab bars.  Use non-skid mats or decals on the floor of the tub or shower.  If you need to sit down while you are in the shower, use a plastic, non-slip stool.  Keep the floor dry. Immediately clean up any water that spills on the floor.  Remove soap buildup in the tub or shower on a regular basis.  Attach bath mats securely with double-sided non-slip rug tape.  Remove throw rugs and other tripping hazards from the floor. What can I do in the bedroom?  Use night lights.  Make sure that a bedside light is easy to reach.  Do not use oversized bedding that drapes onto the floor.  Have a firm chair that has side arms to use for getting dressed.  Remove throw rugs and other tripping hazards from the floor. What can I do in the kitchen?  Clean up any spills right away.  Avoid walking on wet floors.  Place frequently used items in easy-to-reach places.  If you need to reach for something above you, use a sturdy step stool that has a grab bar.  Keep electrical cables out of the way.  Do not use floor polish or wax that makes floors slippery. If you have to use wax, make sure that it is non-skid floor wax.  Remove throw rugs and other tripping hazards from the floor. What can I do in the stairways?  Do not leave any items on the stairs.  Make sure that there are  handrails on both sides of the stairs. Fix handrails that are broken or loose. Make sure that handrails are as long as the stairways.  Check any carpeting to make sure that it is firmly attached to the stairs. Fix any carpet that is loose or worn.  Avoid having throw rugs at the top or bottom of stairways, or secure the rugs with carpet tape to prevent them from moving.  Make sure that you have a light switch at the top of the stairs and the bottom of the stairs. If you do not have them, have them installed. What are some other fall prevention tips?  Wear closed-toe shoes that fit well and support your feet. Wear shoes that have rubber soles or low heels.  When you use a stepladder, make sure that it is completely opened and that the sides are firmly locked. Have someone hold the ladder while you are using it. Do not climb a closed stepladder.  Add color or contrast paint or tape to grab bars and handrails in your home. Place contrasting color strips on the first and last steps.  Use mobility aids as needed, such as canes, walkers, scooters, and crutches.  Turn on lights if it is dark. Replace any light bulbs that burn  out.  Set up furniture so that there are clear paths. Keep the furniture in the same spot.  Fix any uneven floor surfaces.  Choose a carpet design that does not hide the edge of steps of a stairway.  Be aware of any and all pets.  Review your medicines with your healthcare provider. Some medicines can cause dizziness or changes in blood pressure, which increase your risk of falling. Talk with your health care provider about other ways that you can decrease your risk of falls. This may include working with a physical therapist or trainer to improve your strength, balance, and endurance. This information is not intended to replace advice given to you by your health care provider. Make sure you discuss any questions you have with your health care provider. Document Released:  12/28/2001 Document Revised: 06/06/2015 Document Reviewed: 02/11/2014 Elsevier Interactive Patient Education  2017 [employer].

## 2016-05-02 NOTE — Progress Notes (Signed)
I have reviewed and agree with note, evaluation, plan.  Look forward to seeing her tomorrow.   Garret Reddish, MD

## 2016-05-02 NOTE — Progress Notes (Signed)
Subjective:   [name] is a 76 y.o. female who presents for Medicare Annual (Subsequent) preventive examination.  The Patient was informed that the wellness visit is to identify future health risk and educate and initiate measures that can reduce risk for increased disease through the lifespan.    NO ROS; Medicare Wellness Visit  psychososocial Spouse [employer] and worked at [employer] Patient now lives alone Started the IV team at [facility] for home health as well  Retired in [employer] as good, fair or great? Challenged in certain areas Enjoyed nursing career;   Preventive Screening -Counseling & Management  [facility]ammogram; [facility]; declines repeat for now Dexa -1.7 (friend that is orthopedic)  Has para thyroid issues but is trying to get calcium in food  Endo Dr. Elyse Hsu; missed this visit as well  Eating calcium rich foods;  milk, cheese, "does the best she can"   Smoking history Went to [facility] which had an anti- smoking clinic  Quit in 1980 with approx 14 pack hx as she did not smoke a lot initially  Second Hand Smoke status; No Smokers in the home ETOH- social at times; occasional wine  Medication adherence or issues? No Continues Tirosint but states this involves an Insurance claims handler every year   RISK FACTORS Diet Does like sweets  Asked her what she does well?  Monitors her calcium and eats calcium rich foods   eats oatmeal for breakfast Likes fresh fruit  Dark cherries Does eat rye bread   Regular exercise  Does not walk at all; no exercise States she did not make her PT apt due to extenuating circumstances.  Did not reschedule Is concerned about risk for falls; May take tai chi Advocated she explore this and found online number to fup with one program but there are others to review.   Mentioned right knee is weak as compared to left which is s/p knee replacement; Recommended she consider PT as ortho has told her she does not  knee knee surgery on the right knee.  Apt noted at [facility] and plan is to call and reschedule; To also see Dr. Yong Channel tomorrow. Evaluation would be helpful in being successful doing Tai chi which she is interested in.   Cardiac Risk Factors:  Advanced aged  >18 in women Hyperlipidemia medically managed  Diabetes- "holding the line"  Family History - stroke; father had liver cancer  Obesity BMI 66 Did not address her nutritional challenges but tried to stay positive and encourage avenues of interest which she is willing to work on. Exercise and Tai chi and PT is one of them.  Relaxation is a part of tai chi   Fall risk some risk secondary to weight and back and knee issues 4 steps to laundry room  Is careful  Lives alone;   Given education on "Fall Prevention in the Home" for more safety tips the patient can apply as appropriate.  Long term goal is to "age in place"   Mobility of Functional changes this year? 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 Anxious, depressed, irritable, sad or blue? No;  Denies feeling depressed or hopeless; voices  pleasure in daily life Admits to mood being an issues; more so in the winter Medication seems to help;  Missed apt this am and was upset regarding this.  No other issues with daily living but low mood at times she feels improves in the spring and summer.     Activities of Daily Living - See functional screen   Cognitive testing; Ad8 score; 0 or less than 2  MMSE deferred or completed if AD8 + 2 issues Missed one apt; Mood may be a factor in that she was dealing with taxes and other issues.   Advanced Directives completed   Patient Care Team: Marin Olp, MD as PCP - General (Family  Medicine) Dr. Altheimer (endo) dexa and parathyroid  Dr. Wynelle Link following knees    Immunization History  Administered Date(s) Administered  . Influenza Split 10/22/2010, 11/14/2011  . Influenza Whole 11/13/2006, 10/27/2008  . Influenza, High Dose Seasonal PF 10/28/2014, 09/28/2015  . Influenza,inj,Quad PF,36+ Mos 10/05/2012, 10/05/2013  . Pneumococcal Conjugate-13 03/01/2013  . Pneumococcal Polysaccharide-23 05/31/2006  . Td 11/24/1998, 03/01/2013  . Zoster 06/20/2006   Required Immunizations needed today  Screening test up to date or reviewed for plan of completion There are no preventive care reminders to display for this patient.        Objective:     Vitals: BP 130/80   Pulse 65   Ht 5' 2"  (1.575 m)   Wt 230 lb (104.3 kg)   SpO2 95%   BMI 42.07 kg/m   Body mass index is 42.07 kg/m.   Tobacco History  Smoking Status  . Former Smoker  . Packs/day: 0.70  . Years: 20.00  . Quit date: 03/04/1978  Smokeless Tobacco  . Never Used     Counseling given: Yes   Past Medical History:  Diagnosis Date  . Anemia   . Arthritis    osteoarthritis - shoulder, knees & hips  . Asthma   . Depression   . Diabetes mellitus without complication (HCC)    no meds  . Hypertension    Dr. Orinda Kenner manages BP, pt. reports MD has not found a need for treatment   . Hypothyroidism   . Sleep difficulties    had sleep study -2009, prior to gastric surgery, told that there was not a need for f/u  . Thyroid disease   . Vitamin B 12 deficiency    Past Surgical History:  Procedure Laterality Date  . ABDOMINAL HYSTERECTOMY    . BARIATRIC SURGERY    . CARDIAC CATHETERIZATION     [facility]- 30 yrs. ago  . OOPHORECTOMY    . pantallor arthrodesis with rod placement left foot    . TONSILLECTOMY    . TOTAL SHOULDER ARTHROPLASTY Left 03/12/2012   Procedure: TOTAL SHOULDER ARTHROPLASTY;  Surgeon: Marin Shutter, MD;  Location: [facility];  Service: Orthopedics;  Laterality: Left;   Family  History  Problem Relation Age of Onset  . Stroke Mother   . Liver cancer Father   . Cancer Father     liver   History  Sexual Activity  . Sexual activity: Not Currently    Outpatient Encounter Prescriptions as of 05/02/2016  Medication Sig  . albuterol (PROVENTIL HFA;VENTOLIN HFA) 108 (90 Base) MCG/ACT inhaler Inhale 1-2 puffs into the lungs every 4 (four) hours as needed for wheezing or shortness of breath.  Marland Kitchen amLODipine (NORVASC) 10 MG tablet TAKE ONE TABLET BY MOUTH ONCE DAILY  . aspirin 81 MG tablet Take 81  mg by mouth daily.  Marland Kitchen atorvastatin (LIPITOR) 20 MG tablet Take 1 tablet (20 mg total) by mouth once a week.  . Cholecalciferol (VITAMIN D) 1000 UNITS capsule Take 5,000 Units by mouth daily.   . cyanocobalamin (,VITAMIN B-12,) 1000 MCG/ML injection Inject 1 mL (1,000 mcg total) into the muscle every 30 (thirty) days. Uses on amonthly basis  . Diclofenac Sodium CR 100 MG 24 hr tablet Take 1 tablet (100 mg total) by mouth daily.  Marland Kitchen FeFum-FePo-FA-B Cmp-C-Zn-Mn-Cu (SE-TAN PLUS) 162-115.2-1 MG CAPS TAKE ONE CAPSULE BY MOUTH TWICE DAILY  . fluticasone (FLONASE ALLERGY RELIEF) 50 MCG/ACT nasal spray Place 1 spray into both nostrils daily.  Marland Kitchen glucose blood test strip Test blood sugar once a day E11.9  . Lancets (ONETOUCH ULTRASOFT) lancets Use to test blood sugar once a day. E11.9  . montelukast (SINGULAIR) 10 MG tablet Take 1 tablet (10 mg total) by mouth at bedtime.  . Omega-3 Fatty Acids (FISH OIL) 1000 MG CAPS Take 1,000 mg by mouth 2 (two) times daily.   Marland Kitchen PRISTIQ 100 MG 24 hr tablet TAKE 1 BY MOUTH DAILY  . TIROSINT 150 MCG CAPS TK 2 CS PO 2 OUT OF 3 DAYS IN THE MORNING ON EMPTY STOMACH FOR THYROID  . HYDROcodone-acetaminophen (NORCO/VICODIN) 5-325 MG tablet Take 1 tablet by mouth every 6 (six) hours as needed. (Patient not taking: Reported on 05/02/2016)   No facility-administered encounter medications on file as of 05/02/2016.     Activities of Daily Living In your present  state of health, do you have any difficulty performing the following activities: 05/02/2016  Hearing? N  Vision? N  Difficulty concentrating or making decisions? N  Walking or climbing stairs? Y  Dressing or bathing? N  Doing errands, shopping? N  Preparing Food and eating ? N  Using the Toilet? N  In the past six months, have you accidently leaked urine? N  Do you have problems with loss of bowel control? N  Managing your Medications? N  Managing your Finances? N  Housekeeping or managing your Housekeeping? N  Some recent data might be hidden    Patient Care Team: Marin Olp, MD as PCP - General (Family Medicine)    Assessment:     Exercise Activities and Dietary recommendations    Goals    . Exercise 150 minutes per week (moderate activity)          Will explore tai chi or yoga      Fall Risk Fall Risk  05/02/2016 06/28/2015 03/02/2015 07/20/2014 03/01/2013  Falls in the past year? No Yes Yes No No  Number falls in past yr: - 1 1 - -  Injury with Fall? - Yes No - -  Follow up - Education provided - - -   Depression Screen PHQ 2/9 Scores 06/28/2015 04/23/2015 03/02/2015 07/20/2014  PHQ - 2 Score 2 0 0 0  PHQ- 9 Score 3 - - -     Cognitive Function MMSE - Mini Mental State Exam 05/02/2016  Not completed: (No Data)    Had apt time incorrect today but overall functions independently. Hx of low grade depression but controlled with meds     Immunization History  Administered Date(s) Administered  . Influenza Split 10/22/2010, 11/14/2011  . Influenza Whole 11/13/2006, 10/27/2008  . Influenza, High Dose Seasonal PF 10/28/2014, 09/28/2015  . Influenza,inj,Quad PF,36+ Mos 10/05/2012, 10/05/2013  . Pneumococcal Conjugate-13 03/01/2013  . Pneumococcal Polysaccharide-23 05/31/2006  . Td 11/24/1998, 03/01/2013  . Zoster  06/20/2006   Screening Tests Health Maintenance  Topic Date Due  . HEMOGLOBIN A1C  07/04/2016  . INFLUENZA VACCINE  08/21/2016  . OPHTHALMOLOGY EXAM   10/09/2016  . FOOT EXAM  01/03/2017  . URINE MICROALBUMIN  01/03/2017  . TETANUS/TDAP  03/02/2023  . DEXA SCAN  Completed  . PNA vac Low Risk Adult  Completed      Plan:     PCP Notes  Health Maintenance Declines further Mammograms Dexa scan and calicum followed by Dr. Elyse Hsu  Abnormal Screens none   Referrals missed apt for PT due to weather or other Agreed to reschedule as she states her left knee is weak; uses her right leg now to get in car or step up; ortho states it is not an ortho issues; would recommend PT to evaluate for weaknesses and develop a plan for strengthening, which may be helpful prior to her tai chi, where she will recognize weaknesses with balance issues  Patient concerns; notes issues with weight, but did not address due to staying positive and helping her focus on what she is doing well;  Supporting success in area of choice, which is tai chi   Nurse Concerns; monitor mood ongoing; difficult week for her.   Next PCP apt tomorrow   During the course of the visit the patient was educated and counseled about the following appropriate screening and preventive services:   Vaccines to include Pneumoccal, Influenza, Hepatitis B, Td, Zostavax, HCV  Electrocardiogram  Cardiovascular Disease   Colorectal cancer screening 09/2014  Bone density screening -1.7; ortho concerned because she is not on supplemental calcium; but she is   Diabetes screening- pre diabetic and not ready to address today  Glaucoma screening- Dr. Ellie Lunch report 09/2015 on file in media  No diabetic retinopathy detected   Mammography/ 2015; does not wish to repeat  Nutrition counseling / focused on what she is doing well   Patient Instructions (the written plan) was given to the patient.   Wynetta Fines, RN  05/02/2016

## 2016-05-03 ENCOUNTER — Ambulatory Visit (INDEPENDENT_AMBULATORY_CARE_PROVIDER_SITE_OTHER): Payer: Medicare Other | Admitting: Family Medicine

## 2016-05-03 ENCOUNTER — Encounter: Payer: Self-pay | Admitting: Family Medicine

## 2016-05-03 VITALS — BP 136/76 | HR 70 | Temp 98.6°F | Ht 62.0 in | Wt 230.0 lb

## 2016-05-03 DIAGNOSIS — E119 Type 2 diabetes mellitus without complications: Secondary | ICD-10-CM | POA: Diagnosis not present

## 2016-05-03 DIAGNOSIS — F325 Major depressive disorder, single episode, in full remission: Secondary | ICD-10-CM | POA: Diagnosis not present

## 2016-05-03 DIAGNOSIS — I10 Essential (primary) hypertension: Secondary | ICD-10-CM

## 2016-05-03 DIAGNOSIS — E785 Hyperlipidemia, unspecified: Secondary | ICD-10-CM

## 2016-05-03 DIAGNOSIS — M159 Polyosteoarthritis, unspecified: Secondary | ICD-10-CM

## 2016-05-03 LAB — BASIC METABOLIC PANEL
BUN: 13 mg/dL (ref 6–23)
CALCIUM: 10 mg/dL (ref 8.4–10.5)
CHLORIDE: 102 meq/L (ref 96–112)
CO2: 31 meq/L (ref 19–32)
CREATININE: 0.89 mg/dL (ref 0.40–1.20)
GFR: 65.52 mL/min (ref >60.00)
GLUCOSE: 164 mg/dL — AB (ref 70–99)
Potassium: 4.4 mEq/L (ref 3.5–5.1)
Sodium: 136 mEq/L (ref 135–145)

## 2016-05-03 LAB — HEMOGLOBIN A1C: Hgb A1c MFr Bld: 6.5 % (ref 4.6–6.5)

## 2016-05-03 NOTE — Progress Notes (Signed)
Subjective:  [name] is a 76 y.o. year old very pleasant female patient who presents for/with See problem oriented charting ROS- no chest pain or shortness of breath. Has bilateral knee pain. Some myalgias which tylenol helps. No hypoglycemia.    Past Medical History-  Patient Active Problem List   Diagnosis Date Noted  . Hypercalcemia 12/29/2014    Priority: High  . Well controlled type 2 diabetes mellitus ([place]) 07/18/2006    Priority: High  . Morbid obesity ([place]) 06/28/2015    Priority: Medium  . History of gastric bypass 02/08/2014    Priority: Medium  . Osteoarthritis 10/05/2013    Priority: Medium  . Hyperlipidemia 01/05/2008    Priority: Medium  . Unspecified hypothyroidism 09/09/2007    Priority: Medium  . Depression 07/18/2006    Priority: Medium  . Essential hypertension 07/18/2006    Priority: Medium  . ASTHMA 07/18/2006    Priority: Medium  . Allergic rhinitis 07/20/2014    Priority: Low  . CHEST WALL PAIN, ANTERIOR 10/09/2007    Priority: Low    Medications- reviewed and updated Current Outpatient Prescriptions  Medication Sig Dispense Refill  . albuterol (PROVENTIL HFA;VENTOLIN HFA) 108 (90 Base) MCG/ACT inhaler Inhale 1-2 puffs into the lungs every 4 (four) hours as needed for wheezing or shortness of breath. 1 Inhaler 6  . amLODipine (NORVASC) 10 MG tablet TAKE ONE TABLET BY MOUTH ONCE DAILY 90 tablet 3  . aspirin 81 MG tablet Take 81 mg by mouth daily.    Marland Kitchen atorvastatin (LIPITOR) 20 MG tablet Take 1 tablet (20 mg total) by mouth once a week. 13 tablet 3  . Cholecalciferol (VITAMIN D) 1000 UNITS capsule Take 5,000 Units by mouth daily.     . cyanocobalamin (,VITAMIN B-12,) 1000 MCG/ML injection Inject 1 mL (1,000 mcg total) into the muscle every 30 (thirty) days. Uses on amonthly basis 10 mL 2  . Diclofenac Sodium CR 100 MG 24 hr tablet Take 1 tablet (100 mg total) by mouth daily. 90 tablet 1  . FeFum-FePo-FA-B Cmp-C-Zn-Mn-Cu (SE-TAN PLUS)  162-115.2-1 MG CAPS TAKE ONE CAPSULE BY MOUTH TWICE DAILY 180 capsule 3  . fluticasone (FLONASE ALLERGY RELIEF) 50 MCG/ACT nasal spray Place 1 spray into both nostrils daily.    Marland Kitchen glucose blood test strip Test blood sugar once a day E11.9 100 each 12  . HYDROcodone-acetaminophen (NORCO/VICODIN) 5-325 MG tablet Take 1 tablet by mouth every 6 (six) hours as needed. 20 tablet 0  . Lancets (ONETOUCH ULTRASOFT) lancets Use to test blood sugar once a day. E11.9 100 each 12  . montelukast (SINGULAIR) 10 MG tablet Take 1 tablet (10 mg total) by mouth at bedtime. 90 tablet 3  . Omega-3 Fatty Acids (FISH OIL) 1000 MG CAPS Take 1,000 mg by mouth 2 (two) times daily.     Marland Kitchen PRISTIQ 100 MG 24 hr tablet TAKE 1 BY MOUTH DAILY 90 tablet 3  . TIROSINT 150 MCG CAPS TK 2 CS PO 2 OUT OF 3 DAYS IN THE MORNING ON EMPTY STOMACH FOR THYROID  5   No current facility-administered medications for this visit.     Objective: BP 136/76   Pulse 70   Temp 98.6 F (37 C) (Oral)   Ht 5\' 2"  (1.575 m)   Wt 230 lb (104.3 kg)   SpO2 93%   BMI 42.07 kg/m  Gen: NAD, resting comfortably CV: RRR no murmurs rubs or gallops Lungs: CTAB no crackles, wheeze, rhonchi Abdomen: soft/nontender/nondistended/normal bowel sounds. obese Ext:  1+ pretibial  edema Skin: warm, dry  Assessment/Plan:  Well controlled type 2 diabetes mellitus ([place]) S: well controlled. On no rx Exercise and diet- advised trial start exercises- resources given yesterday Lab Results  Component Value Date   HGBA1C 6.4 01/04/2016   HGBA1C 6.3 06/28/2015   HGBA1C 6.6 (H) 12/29/2014   A/P: update bmet and a1c  Essential hypertension S: controlled on repeat on amlodipine 10mg  alone.  BP Readings from Last 3 Encounters:  05/03/16 136/76  05/02/16 130/80  03/20/16 140/80  A/P:Continue current meds. Some edema  Hyperlipidemia S: poorly controlled on atorvastatin 20mg  once a week but only tolerable balance in regards to myalgias- still takes tylenol to  help   A/P: at least LDL down to 109 though would prefer under 100.    Osteoarthritis Hydrocodone #20 12/2015- very rare use in fact hasnt used since given. Knees-voltaren (not highly advisable with bypass)- she would really like to continue to make things tolerable.  R knee bothering her. PT advised- she needs to reschedule.   Morbid obesity ([place]) Advised weight loss to help with knees. Thinking about PT and exercise but resistant to food change  Depression phq2 of 0 on pristiq. Continue current meds. No SI   Return in about 6 months (around 11/02/2016) for physical. come fasting. At [facility]  Orders Placed This Encounter  Procedures  . Basic metabolic panel    [place]  . Hemoglobin A1c       Return precautions advised.  Garret Reddish, MD

## 2016-05-03 NOTE — Patient Instructions (Addendum)
No changes today  Please stop by lab before you go  ______________________________________________________________________  Starting October 1st 2018, I will be transferring to our new location: [facility] [place] (corner of [address] from [employer]) [place], [place] Phone: [phone]  I would love to have you remain my patient at this new location as long as it remains convenient for you. I am excited about the opportunity to have x-ray and sports medicine in the new building but will really miss the awesome staff and physicians at [facility]. Continue to schedule appointments at [facility] and we will automatically transfer them to the [place] location starting October 1st.

## 2016-05-03 NOTE — Assessment & Plan Note (Signed)
S: controlled on repeat on amlodipine 10mg  alone.  BP Readings from Last 3 Encounters:  05/03/16 136/76  05/02/16 130/80  03/20/16 140/80  A/P:Continue current meds. Some edema

## 2016-05-03 NOTE — Assessment & Plan Note (Signed)
S: poorly controlled on atorvastatin 20mg  once a week but only tolerable balance in regards to myalgias- still takes tylenol to help   A/P: at least LDL down to 109 though would prefer under 100.

## 2016-05-03 NOTE — Assessment & Plan Note (Signed)
Hydrocodone #20 12/2015- very rare use in fact hasnt used since given. Knees-voltaren (not highly advisable with bypass)- she would really like to continue to make things tolerable.  R knee bothering her. PT advised- she needs to reschedule.

## 2016-05-03 NOTE — Assessment & Plan Note (Signed)
S: well controlled. On no rx Exercise and diet- advised trial start exercises- resources given yesterday Lab Results  Component Value Date   HGBA1C 6.4 01/04/2016   HGBA1C 6.3 06/28/2015   HGBA1C 6.6 (H) 12/29/2014   A/P: update bmet and a1c

## 2016-05-03 NOTE — Progress Notes (Signed)
Pre visit review using our clinic review tool, if applicable. No additional management support is needed unless otherwise documented below in the visit note. 

## 2016-05-03 NOTE — Assessment & Plan Note (Signed)
Advised weight loss to help with knees. Thinking about PT and exercise but resistant to food change

## 2016-05-03 NOTE — Assessment & Plan Note (Signed)
phq2 of 0 on pristiq. Continue current meds. No SI

## 2016-05-08 ENCOUNTER — Ambulatory Visit: Payer: Medicare Other | Attending: Family Medicine

## 2016-05-08 DIAGNOSIS — M6281 Muscle weakness (generalized): Secondary | ICD-10-CM

## 2016-05-08 DIAGNOSIS — G8929 Other chronic pain: Secondary | ICD-10-CM | POA: Diagnosis present

## 2016-05-08 DIAGNOSIS — M25652 Stiffness of left hip, not elsewhere classified: Secondary | ICD-10-CM

## 2016-05-08 DIAGNOSIS — M25651 Stiffness of right hip, not elsewhere classified: Secondary | ICD-10-CM | POA: Insufficient documentation

## 2016-05-08 DIAGNOSIS — R2689 Other abnormalities of gait and mobility: Secondary | ICD-10-CM | POA: Diagnosis present

## 2016-05-08 DIAGNOSIS — M5440 Lumbago with sciatica, unspecified side: Secondary | ICD-10-CM | POA: Diagnosis not present

## 2016-05-08 NOTE — Therapy (Addendum)
[facility] [address], [place], [place], [address] Phone: [phone]   Fax:  [phone]  Physical Therapy Evaluation  Patient Details  Name: [name] MRN: [id number] Date of Birth: April 11, 1940 Referring Provider: Garret Reddish, MD  Encounter Date: 05/08/2016      PT End of Session - 05/08/16 1108    Visit Number 1   Number of Visits 10   Date for PT Re-Evaluation 07/03/16   PT Start Time 1026   PT Stop Time 1104   PT Time Calculation (min) 38 min   Activity Tolerance Patient tolerated treatment well   Behavior During Therapy [facility] for tasks assessed/performed      Past Medical History:  Diagnosis Date  . Anemia   . Arthritis    osteoarthritis - shoulder, knees & hips  . Asthma   . Depression   . Diabetes mellitus without complication (HCC)    no meds  . Hypertension    Dr. Orinda Kenner manages BP, pt. reports MD has not found a need for treatment   . Hypothyroidism   . Sleep difficulties    had sleep study -2009, prior to gastric surgery, told that there was not a need for f/u  . Thyroid disease   . Vitamin B 12 deficiency     Past Surgical History:  Procedure Laterality Date  . ABDOMINAL HYSTERECTOMY    . BARIATRIC SURGERY    . CARDIAC CATHETERIZATION     [facility]- 30 yrs. ago  . OOPHORECTOMY    . pantallor arthrodesis with rod placement left foot    . TONSILLECTOMY    . TOTAL SHOULDER ARTHROPLASTY Left 03/12/2012   Procedure: TOTAL SHOULDER ARTHROPLASTY;  Surgeon: Marin Shutter, MD;  Location: [place];  Service: Orthopedics;  Laterality: Left;    There were no vitals filed for this visit.       Subjective Assessment - 05/08/16 1029    Subjective Pt presents to PT with complaints of chronic LBP of a chronic nature with bil. hip pain that has increased over the past few months.  Pt reports that she had lumbar fusion L4/5 in 2005.  Pt also reports that her balance has been becoming  progressively worse.    Pertinent History Lumbar decompression and fusion (L4/5 2005), Lt foot/ankle fusion, Lt shoulder fusion   Limitations House hold activities;Standing   How long can you stand comfortably? limited to 5-15 minutes   How long can you walk comfortably? walking: OK when pushing a cart in grocery store.  Without UE support: <5 minutes   Diagnostic tests x-ray of hip with mild degeneration   Patient Stated Goals reduce LBP   Currently in Pain? Yes   Pain Score 5   0/10 with sitting   Pain Location Back   Pain Orientation Right;Left;Lower   Pain Descriptors / Indicators Shooting;Aching   Pain Type Chronic pain   Pain Radiating Towards bil hips   Pain Onset More than a month ago   Pain Frequency Intermittent   Aggravating Factors  desending hills, standing and walking, lifting   Pain Relieving Factors sitting, medication   Effect of Pain on Daily Activities limited standing, walking, pain with housework            [facility] PT Assessment - 05/08/16 0001      Assessment   Medical Diagnosis acute bilateral low back pain with sciatica presence unspecified, pain in both hips   Referring Provider Garret Reddish, MD  Onset Date/Surgical Date 05/09/11   Next MD Visit 06/2016   Prior Therapy none recent     Precautions   Precautions Fall   Precaution Comments Pt has had falls     Restrictions   Weight Bearing Restrictions No     Balance Screen   Has the patient fallen in the past 6 months No   Has the patient had a decrease in activity level because of a fear of falling?  No   Is the patient reluctant to leave their home because of a fear of falling?  No     Home Environment   Living Environment Private residence   Living Arrangements Alone   Type of [place] to enter   Entrance Stairs-Number of Steps 3   Home Layout One level     Prior Function   Level of Independence Independent   Vocation Retired   Marketing executive, Psychologist, occupational at  [employer]	[place]   Overall Cognitive Status Within [place] for tasks assessed     Observation/Other Assessments   Focus on Therapeutic Outcomes (FOTO)  60% limitation     Posture/Postural Control   Posture/Postural Control Postural limitations   Postural Limitations Forward head;Rounded Shoulders;Flexed trunk     ROM / Strength   AROM / PROM / Strength AROM;PROM;Strength     AROM   Overall AROM  Within functional limits for tasks performed   Overall AROM Comments Lumbar A/ROM is WFLs with stiffness reported with end range lumbar motion     PROM   Overall PROM  Deficits   Overall PROM Comments hamstring flexibility is limited by 50% bilaterally, IR limited by 25%.      Strength   Overall Strength Deficits   Overall Strength Comments Lt LE 5/5, Rt hip 4/5, knee 4+/5, ankle 4+/5     Palpation   Palpation comment Pt with diffuse palpable tenderness over Rt and Lt lumbar paraspinals and gluteals.      Transfers   Transfers Sit to Stand;Stand to Sit   Sit to Stand With upper extremity assist   Five time sit to stand comments  19 seconds   Stand to Sit With upper extremity assist     Ambulation/Gait   Ambulation/Gait Yes   Ambulation Distance (Feet) 100 Feet   Gait Pattern Step-through pattern;Decreased stride length;Decreased trunk rotation;Wide base of support   Ambulation Surface Level   Gait Comments ER at bil hips with gait.                           PT Education - 05/08/16 1058    Education provided Yes   Education Details trunk rotation, single knee to chest, hamstring stretch   Person(s) Educated Patient   Methods Demonstration;Explanation;Handout   Comprehension Verbalized understanding;Returned demonstration          PT Short Term Goals - 05/08/16 1116      PT SHORT TERM GOAL #1   Title be independent in initial HEP   Time 4   Period Weeks   Status New     PT SHORT TERM GOAL #2   Title verbalize and demonstrate  correct body mechanics for lumbar protection with home tasks   Time 4   Period Weeks   Status New     PT SHORT TERM GOAL #3   Title report a 25% redution in LBP with standing and walking to improve endurance  for home and community tasks   Time 4   Period Weeks   Status New     PT SHORT TERM GOAL #4   Title perform 5x sit to stand in < or = to 16 seconds to improve balance   Time 4   Period Weeks   Status New           PT Long Term Goals - 05/08/16 1025      PT LONG TERM GOAL #1   Title be independent in advanced HEP   Time 8   Period Weeks   Status New     PT LONG TERM GOAL #2   Title reduce FOTO to < or = to 54% limitation   Time 8   Status New     PT LONG TERM GOAL #3   Title walk for 5 minutes without UE support   Time 8   Period Weeks   Status New     PT LONG TERM GOAL #4   Title report a 50% reduction in LBP with standing and walking to improve endurance in the home and community   Time 8   Period Weeks     PT LONG TERM GOAL #5   Title perform 5x sit to stand in < or = to 14 seconds to reduce falls risk   Time 8   Period Weeks   Status New               Plan - 05/08/16 1109    Clinical Impression Statement Pt presents to PT with complaints of chronic LBP and balance deficits over the past few years. Pt reports 5-10/10 LBP with standing and walking and is limited to walking < 5 minutes without UE and up to 30 minutes with UE support.  Pt is limited to standing 5-15 minutes with household tasks.  Pt with gait abnormality and demonstrates wide base of support, ER at hips and reduced trunk rotation.  5x sit to stand is 19 seconds.  Pt is a moderate complexity evaluation due to comorbidities including lumbar surgery, Lt ankle fusion and balance deficits that will impact care, multiple body parts assessed and evolving condition. Pt will benefit from skilled PT for lumbar/hip flexibility, core strength, balance activities and modalites for pain.     Rehab  Potential Good   PT Frequency 2x / week   PT Duration 8 weeks   PT Treatment/Interventions ADLs/Self Care Home Management;Cryotherapy;Electrical Stimulation;Functional mobility training;Stair training;Gait training;Ultrasound;Moist Heat;Therapeutic activities;Therapeutic exercise;Balance training;Neuromuscular re-education;Patient/family education;Passive range of motion;Manual techniques;Taping   PT Next Visit Plan Balance exercises, lumbar/hip flexibility, core strength, modalities/manual as needed   Consulted and Agree with Plan of Care Patient      Patient will benefit from skilled therapeutic intervention in order to improve the following deficits and impairments:  Pain, Abnormal gait, Decreased range of motion, Difficulty walking, Decreased strength, Postural dysfunction, Decreased activity tolerance, Impaired flexibility, Improper body mechanics, Decreased endurance  Visit Diagnosis: Chronic bilateral low back pain with sciatica, sciatica laterality unspecified - Plan: PT plan of care cert/re-cert  Stiffness of left hip, not elsewhere classified - Plan: PT plan of care cert/re-cert  Stiffness of right hip, not elsewhere classified - Plan: PT plan of care cert/re-cert  Other abnormalities of gait and mobility - Plan: PT plan of care cert/re-cert  Muscle weakness (generalized) - Plan: PT plan of care cert/re-cert      G-Codes - 42/87/68 1025    Functional Assessment Tool Used (Outpatient Only) FOTO:  60% limitation   Functional Limitation Other PT primary   Other PT Primary Current Status (I3729) At least 60 percent but less than 80 percent impaired, limited or restricted   Other PT Primary Goal Status (C2627) At least 40 percent but less than 60 percent impaired, limited or restricted       Problem List Patient Active Problem List   Diagnosis Date Noted  . Morbid obesity ([place]) 06/28/2015  . Hypercalcemia 12/29/2014  . Allergic rhinitis 07/20/2014  . History of gastric  bypass 02/08/2014  . Osteoarthritis 10/05/2013  . Hyperlipidemia 01/05/2008  . CHEST WALL PAIN, ANTERIOR 10/09/2007  . Unspecified hypothyroidism 09/09/2007  . Well controlled type 2 diabetes mellitus ([place]) 07/18/2006  . Depression 07/18/2006  . Essential hypertension 07/18/2006  . ASTHMA 07/18/2006     Sigurd Sos, PT 05/08/16 11:23 AM PHYSICAL THERAPY DISCHARGE SUMMARY G-codes: Other PT primary CL all 3 categories   Visits from Start of Care: 1  Current functional level related to goals / functional outcomes: PT didn't return after evaluation.     Remaining deficits: See above for most recent status.     Education / Equipment: HEP Plan: Patient agrees to discharge.  Patient goals were not met. Patient is being discharged due to not returning since the last visit.  ?????     [facility] [address], [place], [place], [address] Phone: [phone]   Fax:  [phone]  Name: [name] MRN: [id number] Date of Birth: 29-May-1940

## 2016-05-08 NOTE — Patient Instructions (Addendum)
Perform all exercises below:  Hold _20___ seconds. Repeat _3___ times.  Do __3__ sessions per day. CAUTION: Movement should be gentle, steady and slow.  Knee to Chest  Lying supine, bend involved knee to chest. Perform with each leg.   Lumbar Rotation: Caudal - Bilateral (Supine)  Feet and knees together, arms outstretched, rotate knees left, turning head in opposite direction, until stretch is felt.      HIP: Hamstrings - Short Sitting   Rest leg on raised surface. Keep knee straight. Lift chest.   [facility] [address], [address] [place],  [address] Phone # [phone] Fax [phone] 

## 2016-05-14 ENCOUNTER — Other Ambulatory Visit: Payer: Self-pay | Admitting: Family Medicine

## 2016-05-14 ENCOUNTER — Encounter: Payer: Medicare Other | Admitting: Physical Therapy

## 2016-05-14 MED ORDER — PRISTIQ 100 MG PO TB24: ORAL_TABLET | ORAL | 3 refills | Status: DC

## 2016-05-14 NOTE — Telephone Encounter (Signed)
Rx sent 

## 2016-05-14 NOTE — Telephone Encounter (Signed)
° °  Pt request refill of the following:  PRISTIQ 100 MG 24 hr tablet   Phamacy: [facility] mail order

## 2016-05-16 ENCOUNTER — Encounter: Payer: Self-pay | Admitting: Family Medicine

## 2016-05-16 ENCOUNTER — Encounter: Payer: Medicare Other | Admitting: Physical Therapy

## 2016-05-17 ENCOUNTER — Telehealth: Payer: Self-pay | Admitting: Family Medicine

## 2016-05-17 ENCOUNTER — Telehealth: Payer: Self-pay

## 2016-05-17 NOTE — Telephone Encounter (Signed)
[employer] states no PA is needed for this rx PRISTIQ 100 MG 24 hr tablet  [employer] states it may be a cost issue. Not sure why a PA requested.

## 2016-05-17 NOTE — Telephone Encounter (Signed)
Received a call from [place]:  [employer] [phone] option [phone] she said that they faxed over a form yesterday morning  Can you help?

## 2016-05-17 NOTE — Telephone Encounter (Signed)
Received Pa request from [employer] for [employer]. PA submitted & is pending. Key: [id number]

## 2016-05-17 NOTE — Telephone Encounter (Signed)
Called her insurance company and provided the needed clinical information. Her claim is now under review. I called her to let her know.

## 2016-05-17 NOTE — Telephone Encounter (Signed)
[name] pt is calling stating that the insurance has not heard anything from you all concerning a medication Tier level (pt state that you would know the name of the medication and what exactly she is speaking of).

## 2016-05-20 NOTE — Telephone Encounter (Signed)
I spoke to them Friday and completed everything via phone conversation. It was being processed from their end when I left on Friday. I apologize for not letting you know.

## 2016-05-20 NOTE — Telephone Encounter (Signed)
York Cerise, I do not have any paper work for this pt.  Did you receive anything in my absence?

## 2016-05-20 NOTE — Telephone Encounter (Signed)
Noted  

## 2016-05-20 NOTE — Telephone Encounter (Signed)
Noted.  Will close encounter.  

## 2016-05-21 ENCOUNTER — Encounter: Payer: Medicare Other | Admitting: Physical Therapy

## 2016-05-23 ENCOUNTER — Encounter: Payer: Medicare Other | Admitting: Physical Therapy

## 2016-05-28 ENCOUNTER — Encounter: Payer: Medicare Other | Admitting: Physical Therapy

## 2016-05-30 ENCOUNTER — Encounter: Payer: Medicare Other | Admitting: Physical Therapy

## 2016-06-04 ENCOUNTER — Encounter: Payer: Medicare Other | Admitting: Physical Therapy

## 2016-06-06 ENCOUNTER — Ambulatory Visit: Payer: Medicare Other | Admitting: Physical Therapy

## 2016-06-18 ENCOUNTER — Other Ambulatory Visit: Payer: Self-pay | Admitting: Family Medicine

## 2016-06-24 ENCOUNTER — Encounter: Payer: Self-pay | Admitting: Family Medicine

## 2016-08-01 ENCOUNTER — Other Ambulatory Visit: Payer: Self-pay | Admitting: Family Medicine

## 2016-08-01 ENCOUNTER — Encounter: Payer: Self-pay | Admitting: Family Medicine

## 2016-08-01 MED ORDER — DICLOFENAC SODIUM ER 100 MG PO TB24
100.0000 mg | ORAL_TABLET | Freq: Every day | ORAL | 1 refills | Status: DC
Start: 2016-08-01 — End: 2016-10-22

## 2016-08-01 MED ORDER — ATORVASTATIN CALCIUM 20 MG PO TABS: 20.0000 mg | ORAL_TABLET | ORAL | 3 refills | Status: DC

## 2016-08-02 ENCOUNTER — Other Ambulatory Visit: Payer: Self-pay

## 2016-08-02 MED ORDER — HYDROCODONE-ACETAMINOPHEN 5-325 MG PO TABS: 1.0000 | ORAL_TABLET | Freq: Four times a day (QID) | ORAL | 0 refills | Status: DC | PRN

## 2016-08-02 NOTE — Telephone Encounter (Signed)
Looked patient up in [place]. She has only received one narcotic prescription in the last year and it was from our office. I am refilling her prescription request per Dr. Yong Channel.

## 2016-08-14 ENCOUNTER — Other Ambulatory Visit: Payer: Self-pay | Admitting: Family Medicine

## 2016-09-03 ENCOUNTER — Other Ambulatory Visit: Payer: Self-pay

## 2016-09-03 ENCOUNTER — Ambulatory Visit (INDEPENDENT_AMBULATORY_CARE_PROVIDER_SITE_OTHER): Payer: Medicare Other | Admitting: Family Medicine

## 2016-09-03 ENCOUNTER — Encounter: Payer: Self-pay | Admitting: Family Medicine

## 2016-09-03 DIAGNOSIS — E119 Type 2 diabetes mellitus without complications: Secondary | ICD-10-CM

## 2016-09-03 DIAGNOSIS — N3281 Overactive bladder: Secondary | ICD-10-CM | POA: Diagnosis not present

## 2016-09-03 DIAGNOSIS — I10 Essential (primary) hypertension: Secondary | ICD-10-CM

## 2016-09-03 LAB — URINALYSIS
Bilirubin Urine: NEGATIVE
Hgb urine dipstick: NEGATIVE
Ketones, ur: NEGATIVE
LEUKOCYTES UA: NEGATIVE
NITRITE: NEGATIVE
SPECIFIC GRAVITY, URINE: 1.015 (ref 1.000–1.030)
Total Protein, Urine: NEGATIVE
UROBILINOGEN UA: 0.2 (ref 0.0–1.0)
Urine Glucose: NEGATIVE
pH: 6 (ref 5.0–8.0)

## 2016-09-03 LAB — CBC
HEMATOCRIT: 39.8 % (ref 36.0–46.0)
HEMOGLOBIN: 12.9 g/dL (ref 12.0–15.0)
MCHC: 32.5 g/dL (ref 30.0–36.0)
MCV: 89.7 fl (ref 78.0–100.0)
Platelets: 208 10*3/uL (ref 150.0–400.0)
RBC: 4.44 Mil/uL (ref 3.87–5.11)
RDW: 14.3 % (ref 11.5–15.5)
WBC: 5.9 10*3/uL (ref 4.0–10.5)

## 2016-09-03 LAB — COMPREHENSIVE METABOLIC PANEL
ALT: 15 U/L (ref 0–35)
AST: 16 U/L (ref 0–37)
Albumin: 3.8 g/dL (ref 3.5–5.2)
Alkaline Phosphatase: 86 U/L (ref 39–117)
BUN: 13 mg/dL (ref 6–23)
CO2: 29 meq/L (ref 19–32)
Calcium: 9.8 mg/dL (ref 8.4–10.5)
Chloride: 105 mEq/L (ref 96–112)
Creatinine, Ser: 0.79 mg/dL (ref 0.40–1.20)
GFR: 75.11 mL/min (ref >60.00)
GLUCOSE: 122 mg/dL — AB (ref 70–99)
POTASSIUM: 4.1 meq/L (ref 3.5–5.1)
SODIUM: 139 meq/L (ref 135–145)
Total Bilirubin: 0.5 mg/dL (ref 0.2–1.2)
Total Protein: 5.5 g/dL — ABNORMAL LOW (ref 6.0–8.3)

## 2016-09-03 LAB — HEMOGLOBIN A1C: Hgb A1c MFr Bld: 6.4 % (ref 4.6–6.5)

## 2016-09-03 MED ORDER — MIRABEGRON ER 25 MG PO TB24: 25.0000 mg | ORAL_TABLET | Freq: Every day | ORAL | 5 refills | Status: DC

## 2016-09-03 MED ORDER — CYANOCOBALAMIN 1000 MCG/ML IJ SOLN: 1000.0000 ug | INTRAMUSCULAR | 2 refills | Status: DC

## 2016-09-03 NOTE — Progress Notes (Addendum)
Subjective:  [name] is a 76 y.o. year old very pleasant female patient who presents for/with See problem oriented charting ROS- no hypoglycemia. No chest pain. Stable edema. No fever.    Past Medical History-  Patient Active Problem List   Diagnosis Date Noted  . Hypercalcemia 12/29/2014    Priority: High  . Well controlled type 2 diabetes mellitus ([place]) 07/18/2006    Priority: High  . Morbid obesity ([place]) 06/28/2015    Priority: Medium  . History of gastric bypass 02/08/2014    Priority: Medium  . Osteoarthritis 10/05/2013    Priority: Medium  . Hyperlipidemia 01/05/2008    Priority: Medium  . Unspecified hypothyroidism 09/09/2007    Priority: Medium  . Depression 07/18/2006    Priority: Medium  . Essential hypertension 07/18/2006    Priority: Medium  . ASTHMA 07/18/2006    Priority: Medium  . Allergic rhinitis 07/20/2014    Priority: Low  . CHEST WALL PAIN, ANTERIOR 10/09/2007    Priority: Low  . Overactive bladder 09/03/2016    Medications- reviewed and updated Current Outpatient Prescriptions  Medication Sig Dispense Refill  . albuterol (PROVENTIL HFA;VENTOLIN HFA) 108 (90 Base) MCG/ACT inhaler Inhale 1-2 puffs into the lungs every 4 (four) hours as needed for wheezing or shortness of breath. 1 Inhaler 6  . amLODipine (NORVASC) 10 MG tablet TAKE ONE TABLET BY MOUTH ONCE DAILY 90 tablet 3  . aspirin 81 MG tablet Take 81 mg by mouth daily.    Marland Kitchen atorvastatin (LIPITOR) 20 MG tablet Take 1 tablet (20 mg total) by mouth once a week. 13 tablet 3  . Cholecalciferol (VITAMIN D) 1000 UNITS capsule Take 5,000 Units by mouth daily.     . Diclofenac Sodium CR 100 MG 24 hr tablet Take 1 tablet (100 mg total) by mouth daily. 90 tablet 1  . FeFum-FePo-FA-B Cmp-C-Zn-Mn-Cu (SE-TAN PLUS) 162-115.2-1 MG CAPS TAKE ONE CAPSULE BY MOUTH TWICE DAILY 180 capsule 3  . fluticasone (FLONASE ALLERGY RELIEF) 50 MCG/ACT nasal spray Place 1 spray into both nostrils daily.    Marland Kitchen glucose  blood test strip Test blood sugar once a day E11.9 100 each 12  . HYDROcodone-acetaminophen (NORCO/VICODIN) 5-325 MG tablet Take 1 tablet by mouth every 6 (six) hours as needed. 20 tablet 0  . Lancets (ONETOUCH ULTRASOFT) lancets Use to test blood sugar once a day. E11.9 100 each 12  . montelukast (SINGULAIR) 10 MG tablet Take 1 tablet (10 mg total) by mouth at bedtime. 90 tablet 3  . Omega-3 Fatty Acids (FISH OIL) 1000 MG CAPS Take 1,000 mg by mouth 2 (two) times daily.     Marland Kitchen PRISTIQ 100 MG 24 hr tablet TAKE 1 BY MOUTH DAILY 90 tablet 3  . TIROSINT 150 MCG CAPS TK 2 CS PO 2 OUT OF 3 DAYS IN THE MORNING ON EMPTY STOMACH FOR THYROID  5  . cyanocobalamin (,VITAMIN B-12,) 1000 MCG/ML injection Inject 1 mL (1,000 mcg total) into the muscle every 30 (thirty) days. Uses on amonthly basis 10 mL 2  . mirabegron ER (MYRBETRIQ) 25 MG TB24 tablet Take 1 tablet (25 mg total) by mouth daily. 30 tablet 5   No current facility-administered medications for this visit.     Objective: BP 135/82   Pulse 71   Temp 98.6 F (37 C) (Oral)   Ht 5\' 2"  (1.575 m)   Wt 229 lb 6.4 oz (104.1 kg)   SpO2 94%   BMI 41.96 kg/m  Gen: NAD, resting comfortably  CV: RRR no murmurs rubs or gallops Lungs: CTAB no crackles, wheeze, rhonchi Abdomen: soft/nontender/nondistended/normal bowel sounds.obese Ext: trace edema Skin: warm, dry Neuro: grossly normal, moves all extremities  Assessment/Plan:  Essential hypertension S: controlled on  repeat on amlodipine 10mg  alone.  BP Readings from Last 3 Encounters:  09/03/16 135/82  05/03/16 136/76  05/02/16 130/80  A/P: We discussed blood pressure goal of <140/90 given her diabetes. Continue current meds:  Recheck 4-6 weeks due to myrbetriq tiral  Well controlled type 2 diabetes mellitus ([place]) S:  controlled. On no rx last visit. Was given some options for exercise last visit Lab Results  Component Value Date   HGBA1C 6.5 05/03/2016   HGBA1C 6.4 01/04/2016   HGBA1C 6.3  06/28/2015   A/P: update cmp, a1c, ldl, cbc. Hopeful still under 7- she states diet has loosened some  Overactive bladder Urinary urgency/incontinence S: at night seems to pee more than during the day. Drinks a lot of water during the day including carbonatd beverages without caffeine. Goes before bed then wakes up - urgency around 1 30 Am and sometimes doesn't make it so wears pad. Interested in seeing urology - Dr. Alinda Money. Urgency in the daytime and sometimes does not make it. No issues with coughing or sneezing- does kegels regularly.   Symptoms at least a year- seems to be getting worse.  A/P:  Check UA and culture. Trial myrbetriq. She wants to consider urology referral if not improved- hopeful BP tolerates medicine   Return in about 6 weeks (around 10/15/2016) for physical.  Orders Placed This Encounter  Procedures  . Urine Culture    [facility]  . CBC    [facility]  . Comprehensive metabolic panel    [facility]  . Hemoglobin A1c    [place]  . Urinalysis    Standing Status:   Future    Standing Expiration Date:   09/03/2017    Meds ordered this encounter  Medications  . mirabegron ER (MYRBETRIQ) 25 MG TB24 tablet    Sig: Take 1 tablet (25 mg total) by mouth daily.    Dispense:  30 tablet    Refill:  5    Return precautions advised.  Garret Reddish, MD

## 2016-09-03 NOTE — Addendum Note (Signed)
Addended by: Netta Neat D on: 09/03/2016 10:49 AM   Modules accepted: Orders

## 2016-09-03 NOTE — Assessment & Plan Note (Signed)
S: controlled on  repeat on amlodipine 10mg  alone.  BP Readings from Last 3 Encounters:  09/03/16 135/82  05/03/16 136/76  05/02/16 130/80  A/P: We discussed blood pressure goal of <140/90 given her diabetes. Continue current meds:  Recheck 4-6 weeks due to myrbetriq tiral

## 2016-09-03 NOTE — Assessment & Plan Note (Signed)
S:  controlled. On no rx last visit. Was given some options for exercise last visit Lab Results  Component Value Date   HGBA1C 6.5 05/03/2016   HGBA1C 6.4 01/04/2016   HGBA1C 6.3 06/28/2015   A/P: update cmp, a1c, ldl, cbc. Hopeful still under 7- she states diet has loosened some

## 2016-09-03 NOTE — Assessment & Plan Note (Signed)
Urinary urgency/incontinence S: at night seems to pee more than during the day. Drinks a lot of water during the day including carbonatd beverages without caffeine. Goes before bed then wakes up - urgency around 1 30 Am and sometimes doesn't make it so wears pad. Interested in seeing urology - Dr. Alinda Money. Urgency in the daytime and sometimes does not make it. No issues with coughing or sneezing- does kegels regularly.   Symptoms at least a year- seems to be getting worse.  A/P:  Check UA and culture. Trial myrbetriq. She wants to consider urology referral if not improved- hopeful BP tolerates medicine

## 2016-09-03 NOTE — Patient Instructions (Addendum)
Please stop by lab before you go  Trial myrbetriq 25mg . If blood pressure at home above 145- lets have you back sooner but otherwise plan on physical in about 6 weeks (from what I can tell your physical is available now)  Try to tighten diet back up. Also consider exercise resources we discussed last visit

## 2016-09-04 ENCOUNTER — Telehealth: Payer: Self-pay

## 2016-09-04 LAB — URINE CULTURE: ORGANISM ID, BACTERIA: NO GROWTH

## 2016-09-04 NOTE — Telephone Encounter (Signed)
Received PA request for Myrbetriq. PA submitted via form from insurance company. Awaiting decision.

## 2016-09-06 NOTE — Telephone Encounter (Signed)
Patient 76 years old and at risk for anticholinergic side effects- can we submit PA explaining this?

## 2016-09-06 NOTE — Telephone Encounter (Signed)
PA denied, patient has to try Oxybutynin immediate release, tolterodine SR, toviaz.

## 2016-09-10 NOTE — Telephone Encounter (Signed)
Appeal paperwork faxed.

## 2016-09-11 ENCOUNTER — Encounter: Payer: Self-pay | Admitting: Allergy and Immunology

## 2016-09-11 ENCOUNTER — Ambulatory Visit (INDEPENDENT_AMBULATORY_CARE_PROVIDER_SITE_OTHER): Payer: Medicare Other | Admitting: Allergy and Immunology

## 2016-09-11 VITALS — BP 138/72 | HR 66 | Resp 16

## 2016-09-11 DIAGNOSIS — H04123 Dry eye syndrome of bilateral lacrimal glands: Secondary | ICD-10-CM

## 2016-09-11 DIAGNOSIS — J4520 Mild intermittent asthma, uncomplicated: Secondary | ICD-10-CM

## 2016-09-11 DIAGNOSIS — J3089 Other allergic rhinitis: Secondary | ICD-10-CM | POA: Diagnosis not present

## 2016-09-11 NOTE — Patient Instructions (Addendum)
  1. Continue to perform Allergen avoidance measures  2. Continue to Treat and prevent inflammation:   A. OTC flonase 1-2 spray each nostril 3-7 times per week.  B. montelukast 10 mg one tablet one time per day  3. If needed:   A. OTC nasal saline multiple times per day  B. OTC Systane 1 GEL drop each eye at bedtime  C. OTC Systane 1 drop each eye several times per day  D. Patanol 1 drop each eye two times per day  E. Proventil HFA 2 puffs every 4-6 hours  4. Return to clinic in 12 months or earlier if problem  5. Obtain fall flu vaccine

## 2016-09-11 NOTE — Progress Notes (Signed)
Follow-up Note  Referring Provider: Marin Olp, MD Primary Provider: Marin Olp, MD Date of Office Visit: 09/11/2016  Subjective:   [name] (DOB: [birth year]) is a 76 y.o. female who returns to the [facility] on 09/11/2016 in re-evaluation of the following:  HPI: [name] returns to this clinic in reevaluation of her mild intermittent asthma, allergic rhinoconjunctivitis, and dry eye syndrome. Her last visit to this clinic was February 2018.  Asthma has once again been nonexistent. She has not required a systemic steroid to treat an exacerbation and she rarely uses a short acting bronchodilator.  Her nose has been doing relatively well while using her Flonase and montelukast. She has not required an antibiotic to treat an episode of sinusitis.  Her dry eyes are still an issue. She wakes up in the morning with mucoid and granular material and her eyes really burn during that time. Throughout the day she finds her eyes to be irritated. She is only using her Systane one time per day. She does remain away from oral antihistamines.  Allergies as of 09/11/2016      Reactions   Flexeril [cyclobenzaprine]    On Pristiq  With possible serotonin reaction to flexeril   Ace Inhibitors    States after a surgery she was told this. Not sure what the reason was.    Azithromycin    REACTION: Rash   Codeine    REACTION: Upset stomach      Medication List      albuterol 108 (90 Base) MCG/ACT inhaler Commonly known as:  PROVENTIL HFA;VENTOLIN HFA Inhale 1-2 puffs into the lungs every 4 (four) hours as needed for wheezing or shortness of breath.   amLODipine 10 MG tablet Commonly known as:  NORVASC TAKE ONE TABLET BY MOUTH ONCE DAILY   aspirin 81 MG tablet Take 81 mg by mouth daily.   atorvastatin 20 MG tablet Commonly known as:  LIPITOR Take 1 tablet (20 mg total) by mouth once a week.   cyanocobalamin 1000 MCG/ML injection Commonly known as:   (VITAMIN B-12) Inject 1 mL (1,000 mcg total) into the muscle every 30 (thirty) days. Uses on amonthly basis   Diclofenac Sodium CR 100 MG 24 hr tablet Take 1 tablet (100 mg total) by mouth daily.   Fish Oil 1000 MG Caps Take 1,000 mg by mouth 2 (two) times daily.   FLONASE ALLERGY RELIEF 50 MCG/ACT nasal spray Generic drug:  fluticasone Place 1 spray into both nostrils daily.   glucose blood test strip Test blood sugar once a day E11.9   HYDROcodone-acetaminophen 5-325 MG tablet Commonly known as:  NORCO/VICODIN Take 1 tablet by mouth every 6 (six) hours as needed.   mirabegron ER 25 MG Tb24 tablet Commonly known as:  MYRBETRIQ Take 1 tablet (25 mg total) by mouth daily.   montelukast 10 MG tablet Commonly known as:  SINGULAIR Take 1 tablet (10 mg total) by mouth at bedtime.   onetouch ultrasoft lancets Use to test blood sugar once a day. E11.9   PRISTIQ 100 MG 24 hr tablet Generic drug:  desvenlafaxine TAKE 1 BY MOUTH DAILY   SE-TAN PLUS 162-115.2-1 MG Caps TAKE ONE CAPSULE BY MOUTH TWICE DAILY   TIROSINT 150 MCG Caps Generic drug:  Levothyroxine Sodium TK 2 CS PO 2 OUT OF 3 DAYS IN THE MORNING ON EMPTY STOMACH FOR THYROID   Vitamin D 1000 units capsule Take 5,000 Units by mouth daily.  Past Medical History:  Diagnosis Date  . Anemia   . Arthritis    osteoarthritis - shoulder, knees & hips  . Asthma   . Depression   . Diabetes mellitus without complication (HCC)    no meds  . Hypertension    Dr. Orinda Kenner manages BP, pt. reports MD has not found a need for treatment   . Hypothyroidism   . Sleep difficulties    had sleep study -2009, prior to gastric surgery, told that there was not a need for f/u  . Thyroid disease   . Vitamin B 12 deficiency     Past Surgical History:  Procedure Laterality Date  . ABDOMINAL HYSTERECTOMY    . BARIATRIC SURGERY    . CARDIAC CATHETERIZATION     [facility]- 30 yrs. ago  . OOPHORECTOMY    . pantallor arthrodesis  with rod placement left foot    . TONSILLECTOMY    . TOTAL SHOULDER ARTHROPLASTY Left 03/12/2012   Procedure: TOTAL SHOULDER ARTHROPLASTY;  Surgeon: Marin Shutter, MD;  Location: [place] [facility];  Service: Orthopedics;  Laterality: Left;    Review of systems negative except as noted in HPI / PMHx or noted below:  Review of Systems  Constitutional: Negative.   HENT: Negative.   Eyes: Negative.   Respiratory: Negative.   Cardiovascular: Negative.   Gastrointestinal: Negative.   Genitourinary: Negative.   Musculoskeletal: Negative.   Skin: Negative.   Neurological: Negative.   Endo/Heme/Allergies: Negative.   Psychiatric/Behavioral: Negative.      Objective:   Vitals:   09/11/16 0954  BP: 138/72  Pulse: 66  Resp: 16  SpO2: 95%          Physical Exam  Constitutional: She is well-developed, well-nourished, and in no distress.  HENT:  Head: Normocephalic.  Right Ear: Tympanic membrane, external ear and ear canal normal.  Left Ear: Tympanic membrane, external ear and ear canal normal.  Nose: Nose normal. No mucosal edema or rhinorrhea.  Mouth/Throat: Uvula is midline, oropharynx is clear and moist and mucous membranes are normal. No oropharyngeal exudate.  Eyes: Conjunctivae are normal.  Neck: Trachea normal. No tracheal tenderness present. No tracheal deviation present. No thyromegaly present.  Cardiovascular: Normal rate, regular rhythm, S1 normal, S2 normal and normal heart sounds.   No murmur heard. Pulmonary/Chest: Breath sounds normal. No stridor. No respiratory distress. She has no wheezes. She has no rales.  Musculoskeletal: She exhibits no edema.  Lymphadenopathy:       Head (right side): No tonsillar adenopathy present.       Head (left side): No tonsillar adenopathy present.    She has no cervical adenopathy.  Neurological: She is alert. Gait normal.  Skin: No rash noted. She is not diaphoretic. No erythema. Nails show no clubbing.  Psychiatric: Mood and affect  normal.    Diagnostics:    Spirometry was performed and demonstrated an FEV1 of 1.60 at 89 % of predicted.  The patient had an Asthma Control Test with the following results: ACT Total Score: 25.    Assessment and Plan:   1. Asthma, mild intermittent, well-controlled   2. Other allergic rhinitis   3. Dry eye syndrome, bilateral     1. Continue to perform Allergen avoidance measures  2. Continue to Treat and prevent inflammation:   A. OTC flonase 1-2 spray each nostril 3-7 times per week.  B. montelukast 10 mg one tablet one time per day  3. If needed:   A. OTC nasal saline  multiple times per day  B. OTC Systane 1 GEL drop each eye at bedtime  C. OTC Systane 1 drop each eye several times per day  D. Patanol 1 drop each eye two times per day  E. Proventil HFA 2 puffs every 4-6 hours  4. Return to clinic in 12 months or earlier if problem  5. Obtain fall flu vaccine  [name] appears to be doing quite well with her asthma and allergic rhinoconjunctivitis but her dry eye is still somewhat active. I made the suggestion that she use a Systane gel drop before she goes to bed to provide her lubrication throughout the night and then during the daytime she can use the regular Systane drops. If she does well I will see her back in this clinic in 1 year or earlier if there is a problem.  Allena Katz, MD Allergy / Immunology [place]

## 2016-09-12 ENCOUNTER — Telehealth: Payer: Self-pay | Admitting: Family Medicine

## 2016-09-12 NOTE — Telephone Encounter (Signed)
Too $$

## 2016-09-12 NOTE — Telephone Encounter (Signed)
[name]- did the appeal get denied to?

## 2016-09-12 NOTE — Telephone Encounter (Signed)
Received a call from [place] at [facility].  mirabegron ER (MYRBETRIQ) 25 MG TB24 tablet has been denied due to pt not trying Detrol or Ditropan.  Must try those for 60 days before Myrbetriq will be authorized.  Please call Ron with alternative and send in new Rx.

## 2016-09-13 NOTE — Telephone Encounter (Signed)
Left message on machine to call back x1

## 2016-09-13 NOTE — Telephone Encounter (Signed)
[name]- can update patient

## 2016-09-13 NOTE — Telephone Encounter (Signed)
I spoke with pt and informed her of the update and pt understood and states she will wait for an update. Looks like Dr. Yong Channel and Judson Roch were in discussion about an appeal

## 2016-09-13 NOTE — Telephone Encounter (Signed)
We haven't received a decision on the appeal yet.

## 2016-09-16 ENCOUNTER — Other Ambulatory Visit: Payer: Self-pay

## 2016-09-16 ENCOUNTER — Telehealth: Payer: Self-pay | Admitting: Family Medicine

## 2016-09-16 DIAGNOSIS — R32 Unspecified urinary incontinence: Secondary | ICD-10-CM

## 2016-09-16 NOTE — Telephone Encounter (Signed)
° ° ° ° °  [name] with [employer] ask if you would call him back about the below med     mirabegron ER (MYRBETRIQ) 25 MG TB24 tablet

## 2016-09-16 NOTE — Telephone Encounter (Signed)
The appeal was denied. Please recommend an alternative

## 2016-09-16 NOTE — Telephone Encounter (Signed)
[name] is calling back stating that he is needing additional regarding whether Dr. Yong Channel would like to withdraw the appeal or not.  Spoke with [name] who advised that they would like to withdraw the appeal and they will send in an Rx for a different medication.  Advised [name] who stated he would withdraw the appeal.

## 2016-09-16 NOTE — Telephone Encounter (Signed)
Spoke with patient who verbalized understanding.

## 2016-09-16 NOTE — Telephone Encounter (Signed)
Please tell patient about decision and refer her to Dr. Alinda Money- this was her request originally- urology follow up for incontinence

## 2016-09-16 NOTE — Telephone Encounter (Signed)
Referral placed as requested. Called patient and left her a voicemail message asking for a return phone call

## 2016-10-09 ENCOUNTER — Encounter: Payer: Self-pay | Admitting: Family Medicine

## 2016-10-17 ENCOUNTER — Encounter: Payer: Medicare Other | Admitting: Family Medicine

## 2016-10-18 ENCOUNTER — Ambulatory Visit (INDEPENDENT_AMBULATORY_CARE_PROVIDER_SITE_OTHER): Payer: Medicare Other | Admitting: Emergency Medicine

## 2016-10-18 ENCOUNTER — Ambulatory Visit: Payer: Medicare Other

## 2016-10-18 DIAGNOSIS — Z23 Encounter for immunization: Secondary | ICD-10-CM

## 2016-10-22 ENCOUNTER — Other Ambulatory Visit: Payer: Self-pay | Admitting: Family Medicine

## 2016-10-22 ENCOUNTER — Ambulatory Visit (INDEPENDENT_AMBULATORY_CARE_PROVIDER_SITE_OTHER): Payer: Medicare Other | Admitting: Family Medicine

## 2016-10-22 ENCOUNTER — Encounter: Payer: Self-pay | Admitting: Family Medicine

## 2016-10-22 VITALS — BP 144/88 | HR 73 | Temp 98.4°F | Ht 61.0 in | Wt 230.4 lb

## 2016-10-22 DIAGNOSIS — E785 Hyperlipidemia, unspecified: Secondary | ICD-10-CM | POA: Diagnosis not present

## 2016-10-22 DIAGNOSIS — M159 Polyosteoarthritis, unspecified: Secondary | ICD-10-CM | POA: Diagnosis not present

## 2016-10-22 DIAGNOSIS — N3281 Overactive bladder: Secondary | ICD-10-CM

## 2016-10-22 DIAGNOSIS — I10 Essential (primary) hypertension: Secondary | ICD-10-CM | POA: Diagnosis not present

## 2016-10-22 DIAGNOSIS — E119 Type 2 diabetes mellitus without complications: Secondary | ICD-10-CM | POA: Diagnosis not present

## 2016-10-22 DIAGNOSIS — E039 Hypothyroidism, unspecified: Secondary | ICD-10-CM

## 2016-10-22 DIAGNOSIS — Z0001 Encounter for general adult medical examination with abnormal findings: Secondary | ICD-10-CM

## 2016-10-22 DIAGNOSIS — Z0000 Encounter for general adult medical examination without abnormal findings: Secondary | ICD-10-CM

## 2016-10-22 DIAGNOSIS — F325 Major depressive disorder, single episode, in full remission: Secondary | ICD-10-CM

## 2016-10-22 DIAGNOSIS — Z1231 Encounter for screening mammogram for malignant neoplasm of breast: Secondary | ICD-10-CM

## 2016-10-22 LAB — CBC
HCT: 41.1 % (ref 36.0–46.0)
Hemoglobin: 13.4 g/dL (ref 12.0–15.0)
MCHC: 32.6 g/dL (ref 30.0–36.0)
MCV: 89.5 fl (ref 78.0–100.0)
PLATELETS: 212 10*3/uL (ref 150.0–400.0)
RBC: 4.59 Mil/uL (ref 3.87–5.11)
RDW: 13.7 % (ref 11.5–15.5)
WBC: 7 10*3/uL (ref 4.0–10.5)

## 2016-10-22 LAB — LIPID PANEL
CHOL/HDL RATIO: 5
CHOLESTEROL: 185 mg/dL (ref 0–200)
HDL: 41.2 mg/dL (ref >39.00)
LDL Cholesterol: 113 mg/dL — ABNORMAL HIGH (ref 0–99)
NonHDL: 144.21
TRIGLYCERIDES: 155 mg/dL — AB (ref 0.0–149.0)
VLDL: 31 mg/dL (ref 0.0–40.0)

## 2016-10-22 LAB — COMPREHENSIVE METABOLIC PANEL
ALBUMIN: 3.9 g/dL (ref 3.5–5.2)
ALT: 16 U/L (ref 0–35)
AST: 17 U/L (ref 0–37)
Alkaline Phosphatase: 91 U/L (ref 39–117)
BILIRUBIN TOTAL: 0.6 mg/dL (ref 0.2–1.2)
BUN: 14 mg/dL (ref 6–23)
CALCIUM: 10.3 mg/dL (ref 8.4–10.5)
CO2: 32 mEq/L (ref 19–32)
CREATININE: 0.92 mg/dL (ref 0.40–1.20)
Chloride: 104 mEq/L (ref 96–112)
GFR: 62.98 mL/min (ref >60.00)
Glucose, Bld: 134 mg/dL — ABNORMAL HIGH (ref 70–99)
Potassium: 4.8 mEq/L (ref 3.5–5.1)
Sodium: 140 mEq/L (ref 135–145)
Total Protein: 6.7 g/dL (ref 6.0–8.3)

## 2016-10-22 MED ORDER — DICLOFENAC SODIUM 75 MG PO TBEC: 75.0000 mg | DELAYED_RELEASE_TABLET | Freq: Two times a day (BID) | ORAL | 3 refills | Status: DC

## 2016-10-22 NOTE — Assessment & Plan Note (Signed)
Managed by endocrine.

## 2016-10-22 NOTE — Assessment & Plan Note (Signed)
Depression- controlled on pristiq with phq2 of 0.

## 2016-10-22 NOTE — Assessment & Plan Note (Signed)
Hypercalcemia- follows with Dr. Elyse Hsu endocrine- he thinks it is largely normal for her- slightly high for years and years and not worsening. Mild osteopenia- no meds per Dr. Elyse Hsu or me

## 2016-10-22 NOTE — Progress Notes (Signed)
Phone: [phone]  Subjective:  Patient presents today for their annual physical. Chief complaint-noted.   See problem oriented charting- ROS- full  review of systems was completed and negative except for: some windedness with exertion- long term issue and not worsening. No chet pain. Stable edema. No recent issues with asthma  The following were reviewed and entered/updated in epic: Past Medical History:  Diagnosis Date  . Anemia   . Arthritis    osteoarthritis - shoulder, knees & hips  . Asthma   . Depression   . Diabetes mellitus without complication (HCC)    no meds  . Hypertension    Dr. Orinda Kenner manages BP, pt. reports MD has not found a need for treatment   . Hypothyroidism   . Sleep difficulties    had sleep study -2009, prior to gastric surgery, told that there was not a need for f/u  . Thyroid disease   . Vitamin B 12 deficiency    Patient Active Problem List   Diagnosis Date Noted  . Hypercalcemia 12/29/2014    Priority: High  . Well controlled type 2 diabetes mellitus ([place]) 07/18/2006    Priority: High  . Morbid obesity ([place]) 06/28/2015    Priority: Medium  . History of gastric bypass 02/08/2014    Priority: Medium  . Osteoarthritis 10/05/2013    Priority: Medium  . Hyperlipidemia 01/05/2008    Priority: Medium  . Hypothyroidism 09/09/2007    Priority: Medium  . Depression 07/18/2006    Priority: Medium  . Essential hypertension 07/18/2006    Priority: Medium  . ASTHMA 07/18/2006    Priority: Medium  . Allergic rhinitis 07/20/2014    Priority: Low  . CHEST WALL PAIN, ANTERIOR 10/09/2007    Priority: Low  . Overactive bladder 09/03/2016   Past Surgical History:  Procedure Laterality Date  . ABDOMINAL HYSTERECTOMY    . BARIATRIC SURGERY    . CARDIAC CATHETERIZATION     [facility]- 30 yrs. ago  . OOPHORECTOMY    . pantallor arthrodesis with rod placement left foot    . TONSILLECTOMY    . TOTAL SHOULDER ARTHROPLASTY Left 03/12/2012   Procedure:  TOTAL SHOULDER ARTHROPLASTY;  Surgeon: Marin Shutter, MD;  Location: [place];  Service: Orthopedics;  Laterality: Left;    Family History  Problem Relation Age of Onset  . Stroke Mother   . Liver cancer Father   . Cancer Father        liver    Medications- reviewed and updated Current Outpatient Prescriptions  Medication Sig Dispense Refill  . albuterol (PROVENTIL HFA;VENTOLIN HFA) 108 (90 Base) MCG/ACT inhaler Inhale 1-2 puffs into the lungs every 4 (four) hours as needed for wheezing or shortness of breath. 1 Inhaler 6  . amLODipine (NORVASC) 10 MG tablet TAKE ONE TABLET BY MOUTH ONCE DAILY 90 tablet 3  . aspirin 81 MG tablet Take 81 mg by mouth daily.    Marland Kitchen atorvastatin (LIPITOR) 20 MG tablet Take 1 tablet (20 mg total) by mouth once a week. 13 tablet 3  . Cholecalciferol (VITAMIN D) 1000 UNITS capsule Take 5,000 Units by mouth daily.     . cyanocobalamin (,VITAMIN B-12,) 1000 MCG/ML injection Inject 1 mL (1,000 mcg total) into the muscle every 30 (thirty) days. Uses on amonthly basis 10 mL 2  . Diclofenac Sodium CR 100 MG 24 hr tablet Take 1 tablet (100 mg total) by mouth daily. 90 tablet 1  . FeFum-FePo-FA-B Cmp-C-Zn-Mn-Cu (SE-TAN PLUS) 162-115.2-1 MG CAPS TAKE ONE  CAPSULE BY MOUTH TWICE DAILY 180 capsule 3  . fluticasone (FLONASE ALLERGY RELIEF) 50 MCG/ACT nasal spray Place 1 spray into both nostrils daily.    Marland Kitchen glucose blood test strip Test blood sugar once a day E11.9 100 each 12  . HYDROcodone-acetaminophen (NORCO/VICODIN) 5-325 MG tablet Take 1 tablet by mouth every 6 (six) hours as needed. 20 tablet 0  . Lancets (ONETOUCH ULTRASOFT) lancets Use to test blood sugar once a day. E11.9 100 each 12  . mirabegron ER (MYRBETRIQ) 25 MG TB24 tablet Take 1 tablet (25 mg total) by mouth daily. 30 tablet 5  . montelukast (SINGULAIR) 10 MG tablet Take 1 tablet (10 mg total) by mouth at bedtime. 90 tablet 3  . Omega-3 Fatty Acids (FISH OIL) 1000 MG CAPS Take 1,000 mg by mouth 2 (two) times  daily.     Marland Kitchen PRISTIQ 100 MG 24 hr tablet TAKE 1 BY MOUTH DAILY 90 tablet 3  . TIROSINT 150 MCG CAPS TK 2 CS PO 2 OUT OF 3 DAYS IN THE MORNING ON EMPTY STOMACH FOR THYROID  5   No current facility-administered medications for this visit.     Allergies-reviewed and updated Allergies  Allergen Reactions  . Flexeril [Cyclobenzaprine]     On Pristiq  With possible serotonin reaction to flexeril  . Ace Inhibitors     States after a surgery she was told this. Not sure what the reason was.   . Azithromycin     REACTION: Rash  . Codeine     REACTION: Upset stomach    Social History   Social History  . Marital status: Widowed    Spouse name: N/A  . Number of children: N/A  . Years of education: N/A   Occupational History  . retired Marine scientist Retired   Social History Main Topics  . Smoking status: Former Smoker    Packs/day: 0.70    Years: 20.00    Quit date: 03/04/1978  . Smokeless tobacco: Never Used  . Alcohol use 1.2 oz/week    2 Glasses of wine per week     Comment: with dinner  or social rare   . Drug use: No  . Sexual activity: Not Currently   Other Topics Concern  . None   Social History Narrative   Widowed 2013. 2 kids. 4 grandkids. Oldest daughter lives in [place] and has 2 grandkids that are now in Scientist, physiological, PT school at [employer]       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 [place] [address])    Objective: BP (!) 144/88 (BP Location: Right Arm, Patient Position: Sitting, Cuff Size: Normal)   Pulse 73   Temp 98.4 F (36.9 C) (Oral)   Ht 5' 1"  (1.549 m)   Wt 230 lb 6.4 oz (104.5 kg)   SpO2 95%   BMI 43.53 kg/m  Gen: NAD, resting comfortably HEENT: Mucous membranes are moist. Oropharynx normal Neck: no thyromegaly CV: RRR no murmurs rubs or gallops Lungs: CTAB no crackles, wheeze, rhonchi Abdomen: soft/nontender/nondistended/normal bowel sounds. No rebound or guarding.  Morbidly obese Ext: 1+ pretibial stable edema Skin: warm, dry Neuro: grossly normal, moves all extremities, PERRLA  Assessment/Plan:  76 y.o. female presenting for annual physical.  Health Maintenance counseling: 1. Anticipatory guidance: Patient counseled regarding regular dental examjs - every 6 months advised- daughter is her dentist- full mouth false teeth, eye exams - scheduled October 10th,  wearing seatbelts.  2. Risk factor reduction:  Advised patient of need for regular exercise and diet rich and fruits and vegetables to reduce risk of heart attack and stroke. Exercise- just started with tai chi- in month #2- wants to stick with it. Diet-periods of doing well and periods that could be improved upon- discussed increasing % of times she does well. .  Wt Readings from Last 3 Encounters:  10/22/16 230 lb 6.4 oz (104.5 kg)  09/03/16 229 lb 6.4 oz (104.1 kg)  05/03/16 230 lb (104.3 kg)  3. Immunizations/screenings/ancillary studies- discussed shingrix availability issues and option at pharmacy - plans to consider at [facility] - advised patient to let us know when she gets shingrix Immunization History  Administered Date(s) Administered  . Influenza Split 10/22/2010, 11/14/2011  . Influenza Whole 11/13/2006, 10/27/2008  . Influenza, High Dose Seasonal PF 10/28/2014, 09/28/2015, 10/18/2016  . Influenza,inj,Quad PF,6+ Mos 10/05/2012, 10/05/2013  . Influenza-Unspecified 10/21/2013  . Pneumococcal Conjugate-13 03/01/2013, 10/21/2013  . Pneumococcal Polysaccharide-23 05/31/2006  . Td 11/24/1998, 03/01/2013  . Zoster 06/20/2006   4. Cervical cancer screening- passed age based screening. No history of abnormal pap smear 5. Breast cancer screening-  Passed age based screening. Declines breast exam. Mammogram last in 2015- I advised at least one final screening - she agrees 6. Colon cancer screening - 10/19/14 which will be her last screening 7. Skin cancer screening-  advised regular  sunscreen use. Denies worrisome, changing, or new skin lesions.  8. Bone density- 02/19/15 bone density "you have osteopenia but not at levels that I would advise treatment. Generally at this level would recommend calcium and vitamin D supplementation. Since your calcium is high though I would discuss further with Dr. Elyse Hsu instead of starting this." also see hypercalcemia below  Status of chronic or acute concerns   Osteoarthritis OA- Back chronically bothers her. R knee seems to bother her. in April we gave hydrocodone #20 then - she has used very rarely. She uses voltaren despite her bypass history- really helps her though but not as much as she would like- supplementing with tylenol. Also taking ibuprofen daily- advised against. Prior was on 48m BID of voltaren- will return to this. Doing tai chi to hopefully help with core strength  Morbid obesity ([place] Morbid obesity- discussed importance of weight loss. Discussed at least 5 lb goal in next 4 months. Discussed hwo this effects OA  Depression Depression- controlled on pristiq with phq2 of 0.   Hyperlipidemia HLD- poorly controlled on atorvastatin 282monce a week but has been only balance we have been able to find between control and myalgias. Update lipid panel.   Essential hypertension HTN- on amlodipine 1015mlone.  initial BP elevated, and high on repeat- discussed working on weight loss instead of adding meds- about 8 months ago when she lost 7 lbs BP dropped  Hypothyroidism Managed by endocrine  Overactive bladder OAB- trial of myrbetriq was too costly. We referred to urology and has appointment in december.   Well controlled type 2 diabetes mellitus ([place]- discussed need records/eye exam in october.  On no meds - too early to repeat a1c. May remain off meds as long as a1c under 7.  Lab Results  Component Value Date   HGBA1C 6.4 09/03/2016     Hypercalcemia Hypercalcemia- follows with Dr. AltElyse Hsudocrine- he  thinks it is largely normal for her- slightly high for years and years and not worsening. Mild osteopenia- no meds per Dr. AltElyse Hsu me  3 months  Orders Placed This Encounter  Procedures  . CBC    [place]  . Comprehensive metabolic panel    [place]    Order Specific Question:   Has the patient fasted?    Answer:   No  . Lipid panel        Order Specific Question:   Has the patient fasted?    Answer:   No    Meds ordered this encounter  Medications  . diclofenac (VOLTAREN) 75 MG EC tablet    Sig: Take 1 tablet (75 mg total) by mouth 2 (two) times daily.    Dispense:  180 tablet    Refill:  3   Return precautions advised.  Garret Reddish, MD

## 2016-10-22 NOTE — Assessment & Plan Note (Signed)
DM- discussed need records/eye exam in october.  On no meds - too early to repeat a1c. May remain off meds as long as a1c under 7.  Lab Results  Component Value Date   HGBA1C 6.4 09/03/2016

## 2016-10-22 NOTE — Assessment & Plan Note (Signed)
OAB- trial of myrbetriq was too costly. We referred to urology and has appointment in december.

## 2016-10-22 NOTE — Assessment & Plan Note (Signed)
Morbid obesity- discussed importance of weight loss. Discussed at least 5 lb goal in next 4 months. Discussed hwo this effects OA

## 2016-10-22 NOTE — Patient Instructions (Addendum)
Goal at least 5 lbs weight loss in the next 4 months.   For shingrix- please let us know the dates you get this at [facility] get 1 final mammogram. And if normal- only do repeat if have abnormal change to your breast tissue on self exam  Trial voltaren 75mg  twice a day again instead of the extended release

## 2016-10-22 NOTE — Assessment & Plan Note (Signed)
OA- Back chronically bothers her. R knee seems to bother her. in April we gave hydrocodone #20 then - she has used very rarely. She uses voltaren despite her bypass history- really helps her though but not as much as she would like- supplementing with tylenol. Also taking ibuprofen daily- advised against. Prior was on 75mg BID of voltaren- will return to this. Doing tai chi to hopefully help with core strength 

## 2016-10-22 NOTE — Assessment & Plan Note (Signed)
HTN- on amlodipine 10mg  alone.  initial BP elevated, and high on repeat- discussed working on weight loss instead of adding meds- about 8 months ago when she lost 7 lbs BP dropped

## 2016-10-22 NOTE — Assessment & Plan Note (Signed)
HLD- poorly controlled on atorvastatin 20mg  once a week but has been only balance we have been able to find between control and myalgias. Update lipid panel.

## 2016-10-30 LAB — HM DIABETES EYE EXAM

## 2016-10-31 ENCOUNTER — Ambulatory Visit (INDEPENDENT_AMBULATORY_CARE_PROVIDER_SITE_OTHER): Payer: Medicare Other | Admitting: *Deleted

## 2016-10-31 DIAGNOSIS — Z23 Encounter for immunization: Secondary | ICD-10-CM | POA: Diagnosis not present

## 2016-10-31 NOTE — Progress Notes (Signed)
Pt presented to office for 1st Shingrix injection. Pt tolerated well. Pt told needs to return between 2 and 6 months for 2nd injection. Pt verbalized understanding.

## 2016-11-06 ENCOUNTER — Ambulatory Visit
Admission: RE | Admit: 2016-11-06 | Discharge: 2016-11-06 | Disposition: A | Discharge status: Home / Self Care | Payer: Medicare Other | Source: Ambulatory Visit | Attending: Family Medicine | Admitting: Family Medicine

## 2016-11-06 DIAGNOSIS — Z1231 Encounter for screening mammogram for malignant neoplasm of breast: Secondary | ICD-10-CM

## 2016-11-08 ENCOUNTER — Other Ambulatory Visit: Payer: Self-pay | Admitting: Allergy and Immunology

## 2016-11-28 NOTE — Telephone Encounter (Signed)
error 

## 2016-12-04 ENCOUNTER — Encounter: Payer: Medicare Other | Admitting: Family Medicine

## 2016-12-20 ENCOUNTER — Encounter: Payer: Self-pay | Admitting: Family Medicine

## 2017-01-22 ENCOUNTER — Ambulatory Visit: Payer: Medicare Other | Admitting: Family Medicine

## 2017-01-29 ENCOUNTER — Ambulatory Visit: Payer: Medicare Other | Admitting: Family Medicine

## 2017-02-05 ENCOUNTER — Encounter: Payer: Self-pay | Admitting: Family Medicine

## 2017-02-05 ENCOUNTER — Ambulatory Visit (INDEPENDENT_AMBULATORY_CARE_PROVIDER_SITE_OTHER): Payer: Medicare Other | Admitting: Family Medicine

## 2017-02-05 VITALS — BP 132/84 | HR 71 | Temp 98.3°F | Ht 61.0 in | Wt 226.4 lb

## 2017-02-05 DIAGNOSIS — E785 Hyperlipidemia, unspecified: Secondary | ICD-10-CM

## 2017-02-05 DIAGNOSIS — E119 Type 2 diabetes mellitus without complications: Secondary | ICD-10-CM

## 2017-02-05 DIAGNOSIS — M159 Polyosteoarthritis, unspecified: Secondary | ICD-10-CM

## 2017-02-05 DIAGNOSIS — I10 Essential (primary) hypertension: Secondary | ICD-10-CM | POA: Diagnosis not present

## 2017-02-05 DIAGNOSIS — F3342 Major depressive disorder, recurrent, in full remission: Secondary | ICD-10-CM

## 2017-02-05 LAB — MICROALBUMIN / CREATININE URINE RATIO
CREATININE, U: 142.9 mg/dL
MICROALB UR: 1.4 mg/dL (ref 0.0–1.9)
Microalb Creat Ratio: 1 mg/g (ref 0.0–30.0)

## 2017-02-05 LAB — POCT GLYCOSYLATED HEMOGLOBIN (HGB A1C): HEMOGLOBIN A1C: 6

## 2017-02-05 NOTE — Assessment & Plan Note (Signed)
S: Chronic issues with her back and R knee. She uses very sparing hydrocodone- had given #20 in April 2018. Last visit was using voltaren BID and ibuprofen (bypass history and not ideal but improves quality of life)- we advised at lesast stop ibuprofen and can use tylenol prn. She is on voltaren 75mg  BID A/P: symptoms stable today- continue current regimen. bmet next visit

## 2017-02-05 NOTE — Patient Instructions (Addendum)
Drop off urine before you go  Follow up Perhaps 4 months- update cbc, bmet and a1c then. Then do next visit 10/22/17 or later as physical.   I am not sure how you did it but you managed to vacation for almost a month, lose 4 lbs and drop your a1c to 6.0! Great job. Keep it up! Glad you are restarting your tai chi as well

## 2017-02-05 NOTE — Assessment & Plan Note (Addendum)
phq9 of 6 but 3 of those are tired after coming back from nearly month long vacation- she feels grea twith no anhedonia or depressed mood or SI. Continue pristiq

## 2017-02-05 NOTE — Progress Notes (Addendum)
Subjective:  [name] is a 77 y.o. year old very pleasant female patient who presents for/with See problem oriented charting ROS- stable edema, no chest pain. No hypoglycemia.  Some adjustments with vision after cataract surgery  Past Medical History-  Patient Active Problem List   Diagnosis Date Noted  . Hypercalcemia 12/29/2014    Priority: High  . Well controlled type 2 diabetes mellitus ([place]) 07/18/2006    Priority: High  . Morbid obesity ([place]) 06/28/2015    Priority: Medium  . History of gastric bypass 02/08/2014    Priority: Medium  . Osteoarthritis 10/05/2013    Priority: Medium  . Hyperlipidemia 01/05/2008    Priority: Medium  . Hypothyroidism 09/09/2007    Priority: Medium  . Major depression, recurrent, full remission ([place]) 07/18/2006    Priority: Medium  . Essential hypertension 07/18/2006    Priority: Medium  . ASTHMA 07/18/2006    Priority: Medium  . Allergic rhinitis 07/20/2014    Priority: Low  . CHEST WALL PAIN, ANTERIOR 10/09/2007    Priority: Low  . Overactive bladder 09/03/2016    Medications- reviewed and updated Current Outpatient Medications  Medication Sig Dispense Refill  . albuterol (PROVENTIL HFA;VENTOLIN HFA) 108 (90 Base) MCG/ACT inhaler Inhale 1-2 puffs into the lungs every 4 (four) hours as needed for wheezing or shortness of breath. 1 Inhaler 6  . amLODipine (NORVASC) 10 MG tablet TAKE ONE TABLET BY MOUTH ONCE DAILY 90 tablet 3  . aspirin 81 MG tablet Take 81 mg by mouth daily.    Marland Kitchen atorvastatin (LIPITOR) 20 MG tablet Take 1 tablet (20 mg total) by mouth once a week. 13 tablet 3  . Cholecalciferol (VITAMIN D) 1000 UNITS capsule Take 5,000 Units by mouth daily.     . cyanocobalamin (,VITAMIN B-12,) 1000 MCG/ML injection Inject 1 mL (1,000 mcg total) into the muscle every 30 (thirty) days. Uses on amonthly basis 10 mL 2  . diclofenac (VOLTAREN) 75 MG EC tablet Take 1 tablet (75 mg total) by mouth 2 (two) times daily. 180 tablet 3  .  FeFum-FePo-FA-B Cmp-C-Zn-Mn-Cu (SE-TAN PLUS) 162-115.2-1 MG CAPS TAKE ONE CAPSULE BY MOUTH TWICE DAILY 180 capsule 3  . fluticasone (FLONASE ALLERGY RELIEF) 50 MCG/ACT nasal spray Place 1 spray into both nostrils daily.    Marland Kitchen glucose blood test strip Test blood sugar once a day E11.9 100 each 12  . HYDROcodone-acetaminophen (NORCO/VICODIN) 5-325 MG tablet Take 1 tablet by mouth every 6 (six) hours as needed. 20 tablet 0  . Lancets (ONETOUCH ULTRASOFT) lancets Use to test blood sugar once a day. E11.9 100 each 12  . montelukast (SINGULAIR) 10 MG tablet TAKE ONE TABLET BY MOUTH ONCE DAILY AT BEDTIME 90 tablet 3  . Omega-3 Fatty Acids (FISH OIL) 1000 MG CAPS Take 1,000 mg by mouth 2 (two) times daily.     Marland Kitchen PRISTIQ 100 MG 24 hr tablet TAKE 1 BY MOUTH DAILY 90 tablet 3  . TIROSINT 150 MCG CAPS TK 2 CS PO 2 OUT OF 3 DAYS IN THE MORNING ON EMPTY STOMACH FOR THYROID  5   No current facility-administered medications for this visit.     Objective: BP 132/84 (BP Location: Left Arm, Patient Position: Sitting, Cuff Size: Large)   Pulse 71   Temp 98.3 F (36.8 C) (Oral)   Ht _0  (1.549 m)   Wt 226 lb 6.4 oz (102.7 kg)   SpO2 95%   BMI 42.78 kg/m  Gen: NAD, resting comfortably CV: RRR no  murmurs rubs or gallops Lungs: CTAB no crackles, wheeze, rhonchi Abdomen: soft/nontender/nondistended/normal bowel sounds. Morbid obesity Ext: stable 1+ edema Skin: warm, dry  Diabetic Foot Exam - Simple   Simple Foot Form Diabetic Foot exam was performed with the following findings:  Yes 02/05/2017  1:49 PM  Visual Inspection No deformities, no ulcerations, no other skin breakdown bilaterally:  Yes Sensation Testing Intact to touch and monofilament testing bilaterally:  Yes Pulse Check Posterior Tibialis and Dorsalis pulse intact bilaterally:  Yes Comments    Assessment/Plan:  Other issues/follow up 1. Started tai chi before last visit - has fallen off due to vacation- starting back in February. Was  helping with balance 2. Has lost 4 lbs 3. Referred to urology as Edyth Gunnels was costly. Cut down on fluids beforebedtime and helped her.  4. Hypercalcemia and hypothyroidism follows with Dr. Elyse Hsu 5. Doing well after cataract surgery  Hyperlipidemia S: mild poorly controlled on atorvastatin 89m once a week- above this myalgias are intolerable.  Lab Results  Component Value Date   CHOL 185 10/22/2016   HDL 41.20 10/22/2016   LDLCALC 113 (H) 10/22/2016   LDLDIRECT 109.0 01/04/2016   TRIG 155.0 (H) 10/22/2016   CHOLHDL 5 10/22/2016   A/P: continue weekly statin only despite poor control with worsening myalgias at higher dose  Hypertension S: controlled on amlodipine 152malone at 132/84. A/P: We discussed blood pressure goal of <140/90. Continue current meds  Well controlled type 2 diabetes mellitus (HCShabbonaS: prior controlled. On no meds with plans to stay off as long as under 7 CBGs- does not check Exercise and diet- as noted- weight down 4 lbs Lab Results  Component Value Date   HGBA1C 6.4 09/03/2016   HGBA1C 6.5 05/03/2016   HGBA1C 6.4 01/04/2016   A/P:  update a1c and urine microalbumin. a1c improved at 6.0! Great job with weigh tloss  Osteoarthritis S: Chronic issues with her back and R knee. She uses very sparing hydrocodone- had given #20 in April 2018. Last visit was using voltaren BID and ibuprofen (bypass history and not ideal but improves quality of life)- we advised at lesast stop ibuprofen and can use tylenol prn. She is on voltaren 7581mID A/P: symptoms stable today- continue current regimen. bmet next visit  Major depression, recurrent, full remission (HCCWater Millhq9 of 6 but 3 of those are tired after coming back from nearly month long vacation- she feels grea twith no anhedonia or depressed mood or SI. Continue pristiq  Perhaps 4 months- update cbc, bmet and a1c then. Then do next visit 10/22/17 or later as physical.   Lab/Order associations: Well controlled  type 2 diabetes mellitus (HCCRancho San [place] Plan: Microalbumin, urine, POCT glycosylated hemoglobin (Hb A1C)  Return precautions advised.  SteGarret ReddishD

## 2017-02-05 NOTE — Assessment & Plan Note (Signed)
S: prior controlled. On no meds with plans to stay off as long as under 7 CBGs- does not check Exercise and diet- as noted- weight down 4 lbs Lab Results  Component Value Date   HGBA1C 6.4 09/03/2016   HGBA1C 6.5 05/03/2016   HGBA1C 6.4 01/04/2016   A/P:  update a1c and urine microalbumin. a1c improved at 6.0! Great job with weigh tloss

## 2017-02-06 ENCOUNTER — Other Ambulatory Visit: Payer: Self-pay

## 2017-02-06 ENCOUNTER — Encounter: Payer: Self-pay | Admitting: Family Medicine

## 2017-02-06 MED ORDER — ATORVASTATIN CALCIUM 20 MG PO TABS: 20.0000 mg | ORAL_TABLET | ORAL | 3 refills | Status: DC

## 2017-02-07 ENCOUNTER — Encounter: Payer: Self-pay | Admitting: Family Medicine

## 2017-02-10 ENCOUNTER — Encounter: Payer: Self-pay | Admitting: Family Medicine

## 2017-02-11 ENCOUNTER — Other Ambulatory Visit: Payer: Self-pay

## 2017-02-11 MED ORDER — DESVENLAFAXINE SUCCINATE ER 100 MG PO TB24: 100.0000 mg | ORAL_TABLET | Freq: Every day | ORAL | 3 refills | Status: DC

## 2017-02-12 ENCOUNTER — Encounter: Payer: Self-pay | Admitting: Family Medicine

## 2017-02-13 ENCOUNTER — Encounter: Payer: Self-pay | Admitting: Family Medicine

## 2017-02-14 ENCOUNTER — Telehealth: Payer: Self-pay | Admitting: Family Medicine

## 2017-02-14 NOTE — Telephone Encounter (Signed)
Copied from [place] [phone]. Topic: Quick Communication - See Telephone Encounter >> Feb 14, 2017  1:02 PM Arletha Grippe wrote: CRM for notification. See Telephone encounter for:   02/14/17. Prior Josem Kaufmann is needed for desvenlafaxine (PRISTIQ) 100 MG 24 hr tablet. Please call [facility] [phone] option 5

## 2017-02-14 NOTE — Telephone Encounter (Signed)
Prior Authorization was completed and approved already

## 2017-02-14 NOTE — Telephone Encounter (Signed)
See note

## 2017-02-19 ENCOUNTER — Encounter: Payer: Self-pay | Admitting: Family Medicine

## 2017-03-12 ENCOUNTER — Other Ambulatory Visit: Payer: Self-pay | Admitting: Family Medicine

## 2017-03-18 ENCOUNTER — Encounter: Payer: Self-pay | Admitting: Family Medicine

## 2017-04-02 ENCOUNTER — Encounter: Payer: Self-pay | Admitting: Family Medicine

## 2017-04-04 NOTE — Telephone Encounter (Signed)
Please advise 

## 2017-04-08 ENCOUNTER — Other Ambulatory Visit: Payer: Self-pay

## 2017-04-08 ENCOUNTER — Other Ambulatory Visit: Payer: Self-pay | Admitting: Family Medicine

## 2017-04-08 ENCOUNTER — Telehealth: Payer: Self-pay | Admitting: Family Medicine

## 2017-04-08 DIAGNOSIS — M159 Polyosteoarthritis, unspecified: Secondary | ICD-10-CM

## 2017-04-08 MED ORDER — DICLOFENAC SODIUM 75 MG PO TBEC: 75.0000 mg | DELAYED_RELEASE_TABLET | Freq: Two times a day (BID) | ORAL | 0 refills | Status: DC

## 2017-04-08 NOTE — Telephone Encounter (Signed)
LOV 02/05/17 Dr. [facility]

## 2017-04-08 NOTE — Telephone Encounter (Signed)
See note

## 2017-04-08 NOTE — Telephone Encounter (Signed)
Copied from [place]. Topic: Referral - Request >> Apr 08, 2017  8:57 AM Margot Ables wrote: Reason for CRM: pt ins requires referral. She has ortho before for R knee pain.  [place], MD Address: [address], [place], [place] [address] Phone: [phone]

## 2017-04-08 NOTE — Telephone Encounter (Signed)
Copied from [place]. Topic: Quick Communication - Rx Refill/Question >> Apr 08, 2017  8:21 AM Sandi Mariscal E, NT wrote: Medication: diclofenac (VOLTAREN) 75 MG EC tablet   Has the patient contacted their pharmacy? Yes    (Agent: If no, request that the patient contact the pharmacy for the refill.)   Preferred Pharmacy (with phone number or street name): [facility]: Please be advised that RX refills may take up to 3 business days. We ask that you follow-up with your pharmacy.

## 2017-04-08 NOTE — Telephone Encounter (Signed)
Referral placed.

## 2017-04-09 ENCOUNTER — Telehealth: Payer: Self-pay | Admitting: Radiology

## 2017-04-09 NOTE — Telephone Encounter (Signed)
Please advise 

## 2017-04-09 NOTE — Telephone Encounter (Signed)
Copied from [place] [id number]. Topic: General - Other >> Apr 09, 2017  3:57 PM Carolyn Stare wrote:  Pt call to say she received a call from [facility] about her 2nd shingrix and needs to speak with someone before having the injection because she changed insurance companies.   [phone]

## 2017-04-10 ENCOUNTER — Telehealth: Payer: Self-pay | Admitting: Family Medicine

## 2017-04-10 NOTE — Telephone Encounter (Signed)
Copied from [place] [phone]. Topic: Quick Communication - See Telephone Encounter >> Apr 10, 2017 10:56 AM Conception Chancy, NT wrote: CRM for notification. See Telephone encounter for: 04/10/17.  [name] is calling from [facility] and is needing Dr. Yong Channel nurse to contact her in regards to needing a refill on the following.   desvenlafaxine (PRISTIQ) 100 MG 24 hr tablet  amLODipine (NORVASC) 10 MG tablet montelukast (SINGULAIR) 10 MG tablet [employer] trimer[employer] air meter [employer] trimer[employer] test strips True Plus ultra thin lancets  BD single use swab  True metrics control solution  Fax [facility] [place], [place]  [place] [address]  Phone: [phone] Fax: [phone]

## 2017-04-10 NOTE — Telephone Encounter (Signed)
Left message for patient to return call.

## 2017-04-10 NOTE — Telephone Encounter (Signed)
Spoke with the patient and she stated that she called her insurance company. They stated that we need to call her insurance company to give them information. Patient will call back with phone number.

## 2017-04-10 NOTE — Telephone Encounter (Signed)
See note

## 2017-04-11 ENCOUNTER — Other Ambulatory Visit: Payer: Self-pay

## 2017-04-11 MED ORDER — ATORVASTATIN CALCIUM 20 MG PO TABS: 20.0000 mg | ORAL_TABLET | ORAL | 3 refills | Status: DC

## 2017-04-11 MED ORDER — MONTELUKAST SODIUM 10 MG PO TABS: 10.0000 mg | ORAL_TABLET | Freq: Every day | ORAL | 3 refills | Status: DC

## 2017-04-11 MED ORDER — FLUTICASONE PROPIONATE 50 MCG/ACT NA SUSP: 1.0000 | Freq: Every day | NASAL | 3 refills | Status: DC

## 2017-04-11 MED ORDER — DICLOFENAC SODIUM 75 MG PO TBEC: 75.0000 mg | DELAYED_RELEASE_TABLET | Freq: Two times a day (BID) | ORAL | 0 refills | Status: DC

## 2017-04-11 MED ORDER — AMLODIPINE BESYLATE 10 MG PO TABS
10.0000 mg | ORAL_TABLET | Freq: Every day | ORAL | 3 refills | Status: DC
Start: 2017-04-11 — End: 2018-02-25

## 2017-04-11 MED ORDER — DESVENLAFAXINE SUCCINATE ER 100 MG PO TB24: 100.0000 mg | ORAL_TABLET | Freq: Every day | ORAL | 3 refills | Status: DC

## 2017-04-11 MED ORDER — SE-TAN PLUS 162-115.2-1 MG PO CAPS: 1.0000 | ORAL_CAPSULE | Freq: Two times a day (BID) | ORAL | 3 refills | Status: DC

## 2017-04-11 NOTE — Telephone Encounter (Signed)
Called patient and reviewed over needed prescriptions. I sent her prescription refills in

## 2017-04-22 DIAGNOSIS — M170 Bilateral primary osteoarthritis of knee: Secondary | ICD-10-CM | POA: Diagnosis not present

## 2017-05-01 ENCOUNTER — Ambulatory Visit (INDEPENDENT_AMBULATORY_CARE_PROVIDER_SITE_OTHER): Payer: Medicare HMO

## 2017-05-01 DIAGNOSIS — Z23 Encounter for immunization: Secondary | ICD-10-CM | POA: Diagnosis not present

## 2017-05-22 ENCOUNTER — Encounter: Payer: Self-pay | Admitting: Family Medicine

## 2017-05-27 ENCOUNTER — Other Ambulatory Visit: Payer: Self-pay

## 2017-05-27 MED ORDER — GLUCOSE BLOOD VI STRP: ORAL_STRIP | 3 refills | Status: DC

## 2017-05-27 MED ORDER — ALCOHOL SWABS PADS: MEDICATED_PAD | 11 refills | Status: DC

## 2017-05-27 MED ORDER — TRUEPLUS LANCETS 30G MISC: 3 refills | Status: DC

## 2017-05-27 MED ORDER — TRUE METRIX LEVEL 2 NORMAL VI SOLN: 0 refills | Status: DC

## 2017-05-27 MED ORDER — TRUE METRIX AIR GLUCOSE METER DEVI: 1.0000 | Freq: Once | 0 refills | Status: AC

## 2017-06-04 ENCOUNTER — Encounter: Payer: Self-pay | Admitting: Family Medicine

## 2017-06-04 ENCOUNTER — Ambulatory Visit (INDEPENDENT_AMBULATORY_CARE_PROVIDER_SITE_OTHER): Payer: Medicare HMO | Admitting: Family Medicine

## 2017-06-04 VITALS — BP 128/74 | HR 68 | Temp 98.7°F | Ht 61.0 in | Wt 224.8 lb

## 2017-06-04 DIAGNOSIS — I10 Essential (primary) hypertension: Secondary | ICD-10-CM

## 2017-06-04 DIAGNOSIS — E119 Type 2 diabetes mellitus without complications: Secondary | ICD-10-CM | POA: Diagnosis not present

## 2017-06-04 DIAGNOSIS — M159 Polyosteoarthritis, unspecified: Secondary | ICD-10-CM

## 2017-06-04 DIAGNOSIS — E785 Hyperlipidemia, unspecified: Secondary | ICD-10-CM | POA: Diagnosis not present

## 2017-06-04 DIAGNOSIS — Z79899 Other long term (current) drug therapy: Secondary | ICD-10-CM

## 2017-06-04 DIAGNOSIS — F3342 Major depressive disorder, recurrent, in full remission: Secondary | ICD-10-CM

## 2017-06-04 LAB — COMPREHENSIVE METABOLIC PANEL
ALK PHOS: 99 U/L (ref 39–117)
ALT: 22 U/L (ref 0–35)
AST: 20 U/L (ref 0–37)
Albumin: 4 g/dL (ref 3.5–5.2)
BILIRUBIN TOTAL: 0.6 mg/dL (ref 0.2–1.2)
BUN: 14 mg/dL (ref 6–23)
CO2: 30 mEq/L (ref 19–32)
CREATININE: 0.85 mg/dL (ref 0.40–1.20)
Calcium: 10.5 mg/dL (ref 8.4–10.5)
Chloride: 100 mEq/L (ref 96–112)
GFR: 68.89 mL/min (ref >60.00)
Glucose, Bld: 119 mg/dL — ABNORMAL HIGH (ref 70–99)
Potassium: 4.4 mEq/L (ref 3.5–5.1)
SODIUM: 136 meq/L (ref 135–145)
TOTAL PROTEIN: 7.2 g/dL (ref 6.0–8.3)

## 2017-06-04 LAB — HEMOGLOBIN A1C: Hgb A1c MFr Bld: 6.6 % — ABNORMAL HIGH (ref 4.6–6.5)

## 2017-06-04 LAB — CBC
HCT: 42.2 % (ref 36.0–46.0)
Hemoglobin: 14 g/dL (ref 12.0–15.0)
MCHC: 33.2 g/dL (ref 30.0–36.0)
MCV: 89.4 fl (ref 78.0–100.0)
Platelets: 229 10*3/uL (ref 150.0–400.0)
RBC: 4.72 Mil/uL (ref 3.87–5.11)
RDW: 14.6 % (ref 11.5–15.5)
WBC: 6.1 10*3/uL (ref 4.0–10.5)

## 2017-06-04 MED ORDER — HYDROCODONE-ACETAMINOPHEN 5-325 MG PO TABS: 1.0000 | ORAL_TABLET | Freq: Four times a day (QID) | ORAL | 0 refills | Status: DC | PRN

## 2017-06-04 NOTE — Assessment & Plan Note (Signed)
S: Diet-controlled .  CBGs-does not check. She is going to get a meter from [place]. Down 6 lbs from October.  Lab Results  Component Value Date   HGBA1C 6.0 02/05/2017   HGBA1C 6.4 09/03/2016   HGBA1C 6.5 05/03/2016  A/P: update a1c

## 2017-06-04 NOTE — Assessment & Plan Note (Addendum)
S: Patient has chronic issues with arthritis in her low back and right knee.  She does not want to pursue surgery.  She uses very sparing hydrocodone- she has 2 left from 2017- she never filled rx from 07/2016.   Using CBD ointment on back and that is helping- also using knees topically  Had steroid injection with Dr. Haywood Filler and going back tomorrow- she wants to consider gel injections A/P: refill hydrocodone today #20.  UDS today. Controlled substance contract today

## 2017-06-04 NOTE — Patient Instructions (Addendum)
Our team will set you up with controlled substance contract  No changes in medication today

## 2017-06-04 NOTE — Assessment & Plan Note (Signed)
S: She remains on Pristiq (generic now- some increased sweating on this) which has been the most helpful for her. PHQ9 of 2 today with no SI.  A/P: continue current medicine. Full remission remains- major depression recurrent

## 2017-06-04 NOTE — Progress Notes (Signed)
Subjective:  [name] is a 77 y.o. year old very pleasant female patient who presents for/with See problem oriented charting ROS- No chest pain or shortness of breath. No headache or blurry vision. Some leg cramps at night   Past Medical History-  Patient Active Problem List   Diagnosis Date Noted  . Hypercalcemia 12/29/2014    Priority: High  . Well controlled type 2 diabetes mellitus ([place]) 07/18/2006    Priority: High  . Morbid obesity ([place]) 06/28/2015    Priority: Medium  . History of gastric bypass 02/08/2014    Priority: Medium  . Osteoarthritis 10/05/2013    Priority: Medium  . Hyperlipidemia 01/05/2008    Priority: Medium  . Hypothyroidism 09/09/2007    Priority: Medium  . Major depression, recurrent, full remission ([place]) 07/18/2006    Priority: Medium  . Essential hypertension 07/18/2006    Priority: Medium  . ASTHMA 07/18/2006    Priority: Medium  . Allergic rhinitis 07/20/2014    Priority: Low  . CHEST WALL PAIN, ANTERIOR 10/09/2007    Priority: Low  . Overactive bladder 09/03/2016    Medications- reviewed and updated Current Outpatient Medications  Medication Sig Dispense Refill  . albuterol (PROVENTIL HFA;VENTOLIN HFA) 108 (90 Base) MCG/ACT inhaler Inhale 1-2 puffs into the lungs every 4 (four) hours as needed for wheezing or shortness of breath. 1 Inhaler 6  . Alcohol Swabs PADS Use to check blood sugar once a day 100 each 11  . amLODipine (NORVASC) 10 MG tablet Take 1 tablet (10 mg total) by mouth daily. 90 tablet 3  . aspirin 81 MG tablet Take 81 mg by mouth daily.    Marland Kitchen atorvastatin (LIPITOR) 20 MG tablet Take 1 tablet (20 mg total) by mouth once a week. 13 tablet 3  . Blood Glucose Calibration (TRUE METRIX LEVEL 2) Normal SOLN Use to calibrate meter as needed 1 each 0  . Cholecalciferol (VITAMIN D) 1000 UNITS capsule Take 5,000 Units by mouth daily.     . cyanocobalamin (,VITAMIN B-12,) 1000 MCG/ML injection Inject 1 mL (1,000 mcg total) into the  muscle every 30 (thirty) days. Uses on amonthly basis 10 mL 2  . desvenlafaxine (PRISTIQ) 100 MG 24 hr tablet Take 1 tablet (100 mg total) by mouth daily. 90 tablet 3  . diclofenac (VOLTAREN) 75 MG EC tablet Take 1 tablet (75 mg total) by mouth 2 (two) times daily. 180 tablet 0  . FeFum-FePo-FA-B Cmp-C-Zn-Mn-Cu (SE-TAN PLUS) 162-115.2-1 MG CAPS Take 1 capsule by mouth 2 (two) times daily. 180 capsule 3  . fluticasone (FLONASE ALLERGY RELIEF) 50 MCG/ACT nasal spray Place 1 spray into both nostrils daily. 16 g 3  . glucose blood test strip Test blood sugar once per day 100 each 3  . HYDROcodone-acetaminophen (NORCO/VICODIN) 5-325 MG tablet Take 1 tablet by mouth every 6 (six) hours as needed. 20 tablet 0  . montelukast (SINGULAIR) 10 MG tablet Take 1 tablet (10 mg total) by mouth daily with breakfast. 90 tablet 3  . Omega-3 Fatty Acids (FISH OIL) 1000 MG CAPS Take 1,000 mg by mouth 2 (two) times daily.     Marland Kitchen TIROSINT 150 MCG CAPS TK 2 CS PO 2 OUT OF 3 DAYS IN THE MORNING ON EMPTY STOMACH FOR THYROID  5  . TRUEPLUS LANCETS 30G MISC Check blood sugar once per day 100 each 3   No current facility-administered medications for this visit.     Objective: BP 128/74 (BP Location: Left Arm, Patient Position: Sitting, Cuff  Size: Large)   Pulse 68   Temp 98.7 F (37.1 C) (Oral)   Ht _0  (1.549 m)   Wt 224 lb 12.8 oz (102 kg)   SpO2 96%   BMI 42.48 kg/m  Gen: NAD, resting comfortably CV: RRR no murmurs rubs or gallops Lungs: CTAB no crackles, wheeze, rhonchi Abdomen: soft/nontender/obese Ext:  Trace edema Skin: warm, dry Neuro: normal speech  Assessment/Plan:  Other notes: 1.Asked her about tai chi- encouraged her to get back in.  2.  Asked her about her weight progress - she continues to make good progress.  Wt Readings from Last 3 Encounters:  06/04/17 224 lb 12.8 oz (102 kg)  02/05/17 226 lb 6.4 oz (102.7 kg)  10/22/16 230 lb 6.4 oz (104.5 kg)  3.  Still following with Dr. Elyse Hsu  for hypercalcemia and hypothyroidism 4.  Last visit she was doing okay with urological issues-she had cut down on fluids before bedtime.  She had not been able to afford Myrbetriq. 5.Was on magnesium and when she stopped cramping in legs didn't improve- advised trial mustard- she tried and states not sure it helped. They are at rest at night. Advised could do 2 week trial off statin to see if makes difference   Major depression, recurrent, full remission ([place]) S: She remains on Pristiq (generic now- some increased sweating on this) which has been the most helpful for her. PHQ9 of 2 today with no SI.  A/P: continue current medicine. Full remission remains- major depression recurrent  Osteoarthritis S: Patient has chronic issues with arthritis in her low back and right knee.  She does not want to pursue surgery.  She uses very sparing hydrocodone- she has 2 left from 2017- she never filled rx from 07/2016.   Using CBD ointment on back and that is helping- also using knees topically  Had steroid injection with Dr. Haywood Filler and going back tomorrow- she wants to consider gel injections A/P: refill hydrocodone today #20.  UDS today. Controlled substance contract today  Hyperlipidemia S: Mild poorly controlled on atorvastatin 20 mg once a week with last LDL in October of 113.  She still has some myalgias on this.  A/P: Not ideal control but not ideal side effect profile either-continue once a week statin  Essential hypertension S: controlled on amlodipine 10 mg alone BP Readings from Last 3 Encounters:  06/04/17 128/74  02/05/17 132/84  10/22/16 (!) 144/88  A/P: We discussed blood pressure goal of <140/90. Continue current meds  Well controlled type 2 diabetes mellitus ([place]) S: Diet-controlled .  CBGs-does not check. She is going to get a meter from [place]. Down 6 lbs from October.  Lab Results  Component Value Date   HGBA1C 6.0 02/05/2017   HGBA1C 6.4 09/03/2016   HGBA1C 6.5 05/03/2016   A/P: update a1c   Future Appointments  Date Time Provider [place]  11/19/2017  8:30 AM Marin Olp, MD [facility]  scheduled for physical  Lab/Order associations: Well controlled type 2 diabetes mellitus ([place]) - Plan: Hemoglobin A1c, Comprehensive metabolic panel, CBC  High risk medication use - Plan: Pain Mgmt, Profile 8 w/Conf, U  Meds ordered this encounter  Medications  . HYDROcodone-acetaminophen (NORCO/VICODIN) 5-325 MG tablet    Sig: Take 1 tablet by mouth every 6 (six) hours as needed.    Dispense:  20 tablet    Refill:  0    OA of the knees-Chronic pain   Return precautions advised.  Garret Reddish, MD

## 2017-06-04 NOTE — Assessment & Plan Note (Signed)
S: controlled on amlodipine 10 mg alone BP Readings from Last 3 Encounters:  06/04/17 128/74  02/05/17 132/84  10/22/16 (!) 144/88  A/P: We discussed blood pressure goal of <140/90. Continue current meds

## 2017-06-04 NOTE — Progress Notes (Signed)
your CBC was normal (blood counts, infection fighting cells, platelets). Your CMET was normal (kidney, liver, and electrolytes, blood sugar) for someone with diabetes Your hemoglobin a1c was up some to 6.6 but still under goal of 7 so wouldn't change any medications

## 2017-06-04 NOTE — Assessment & Plan Note (Signed)
S: Mild poorly controlled on atorvastatin 20 mg once a week with last LDL in October of 113.  She still has some myalgias on this.  A/P: Not ideal control but not ideal side effect profile either-continue once a week statin

## 2017-06-05 ENCOUNTER — Encounter: Payer: Self-pay | Admitting: Family Medicine

## 2017-06-05 DIAGNOSIS — M170 Bilateral primary osteoarthritis of knee: Secondary | ICD-10-CM | POA: Diagnosis not present

## 2017-06-05 LAB — PAIN MGMT, PROFILE 8 W/CONF, U
6 ACETYLMORPHINE: NEGATIVE ng/mL (ref <10)
Alcohol Metabolites: NEGATIVE ng/mL (ref <500)
Amphetamines: NEGATIVE ng/mL (ref <500)
BENZODIAZEPINES: NEGATIVE ng/mL (ref <100)
BUPRENORPHINE, URINE: NEGATIVE ng/mL (ref <5)
Cocaine Metabolite: NEGATIVE ng/mL (ref <150)
Creatinine: 40.9 mg/dL
MARIJUANA METABOLITE: NEGATIVE ng/mL (ref <20)
MDMA: NEGATIVE ng/mL (ref <500)
OPIATES: NEGATIVE ng/mL (ref <100)
OXIDANT: NEGATIVE ug/mL (ref <200)
OXYCODONE: NEGATIVE ng/mL (ref <100)
pH: 6.93 (ref 4.5–9.0)

## 2017-06-07 ENCOUNTER — Encounter: Payer: Self-pay | Admitting: Family Medicine

## 2017-06-09 ENCOUNTER — Encounter: Payer: Self-pay | Admitting: Family Medicine

## 2017-06-12 DIAGNOSIS — M1711 Unilateral primary osteoarthritis, right knee: Secondary | ICD-10-CM | POA: Diagnosis not present

## 2017-06-12 DIAGNOSIS — M1712 Unilateral primary osteoarthritis, left knee: Secondary | ICD-10-CM | POA: Diagnosis not present

## 2017-06-19 DIAGNOSIS — M170 Bilateral primary osteoarthritis of knee: Secondary | ICD-10-CM | POA: Diagnosis not present

## 2017-06-27 DIAGNOSIS — E063 Autoimmune thyroiditis: Secondary | ICD-10-CM | POA: Diagnosis not present

## 2017-06-27 DIAGNOSIS — E038 Other specified hypothyroidism: Secondary | ICD-10-CM | POA: Diagnosis not present

## 2017-07-03 DIAGNOSIS — M8580 Other specified disorders of bone density and structure, unspecified site: Secondary | ICD-10-CM | POA: Diagnosis not present

## 2017-07-03 DIAGNOSIS — E559 Vitamin D deficiency, unspecified: Secondary | ICD-10-CM | POA: Diagnosis not present

## 2017-07-03 DIAGNOSIS — E063 Autoimmune thyroiditis: Secondary | ICD-10-CM | POA: Diagnosis not present

## 2017-07-03 DIAGNOSIS — E038 Other specified hypothyroidism: Secondary | ICD-10-CM | POA: Diagnosis not present

## 2017-07-03 DIAGNOSIS — E210 Primary hyperparathyroidism: Secondary | ICD-10-CM | POA: Diagnosis not present

## 2017-07-07 ENCOUNTER — Encounter: Payer: Self-pay | Admitting: Family Medicine

## 2017-07-07 ENCOUNTER — Emergency Department (HOSPITAL_COMMUNITY)
Admission: EM | Admit: 2017-07-07 | Discharge: 2017-07-07 | Disposition: A | Discharge status: Home / Self Care | Payer: Medicare HMO | Attending: Emergency Medicine | Admitting: Emergency Medicine

## 2017-07-07 ENCOUNTER — Encounter (HOSPITAL_COMMUNITY): Payer: Self-pay

## 2017-07-07 ENCOUNTER — Emergency Department (HOSPITAL_COMMUNITY): Payer: Medicare HMO

## 2017-07-07 ENCOUNTER — Other Ambulatory Visit: Payer: Self-pay

## 2017-07-07 DIAGNOSIS — I10 Essential (primary) hypertension: Secondary | ICD-10-CM | POA: Diagnosis not present

## 2017-07-07 DIAGNOSIS — J45909 Unspecified asthma, uncomplicated: Secondary | ICD-10-CM | POA: Diagnosis not present

## 2017-07-07 DIAGNOSIS — E119 Type 2 diabetes mellitus without complications: Secondary | ICD-10-CM | POA: Diagnosis not present

## 2017-07-07 DIAGNOSIS — Z87891 Personal history of nicotine dependence: Secondary | ICD-10-CM | POA: Diagnosis not present

## 2017-07-07 DIAGNOSIS — Z96612 Presence of left artificial shoulder joint: Secondary | ICD-10-CM | POA: Diagnosis not present

## 2017-07-07 DIAGNOSIS — R0789 Other chest pain: Secondary | ICD-10-CM

## 2017-07-07 DIAGNOSIS — Z7982 Long term (current) use of aspirin: Secondary | ICD-10-CM | POA: Diagnosis not present

## 2017-07-07 DIAGNOSIS — E039 Hypothyroidism, unspecified: Secondary | ICD-10-CM | POA: Insufficient documentation

## 2017-07-07 DIAGNOSIS — Z79899 Other long term (current) drug therapy: Secondary | ICD-10-CM | POA: Diagnosis not present

## 2017-07-07 DIAGNOSIS — R457 State of emotional shock and stress, unspecified: Secondary | ICD-10-CM | POA: Diagnosis not present

## 2017-07-07 DIAGNOSIS — I213 ST elevation (STEMI) myocardial infarction of unspecified site: Secondary | ICD-10-CM | POA: Diagnosis not present

## 2017-07-07 DIAGNOSIS — R079 Chest pain, unspecified: Secondary | ICD-10-CM | POA: Diagnosis not present

## 2017-07-07 LAB — BASIC METABOLIC PANEL
Anion gap: 8 (ref 5–15)
BUN: 11 mg/dL (ref 6–20)
CHLORIDE: 102 mmol/L (ref 101–111)
CO2: 26 mmol/L (ref 22–32)
CREATININE: 0.94 mg/dL (ref 0.44–1.00)
Calcium: 10 mg/dL (ref 8.9–10.3)
GFR calc non Af Amer: 57 mL/min — ABNORMAL LOW (ref >60)
Glucose, Bld: 155 mg/dL — ABNORMAL HIGH (ref 65–99)
POTASSIUM: 4.4 mmol/L (ref 3.5–5.1)
Sodium: 136 mmol/L (ref 135–145)

## 2017-07-07 LAB — CBC
HEMATOCRIT: 43.1 % (ref 36.0–46.0)
Hemoglobin: 13.5 g/dL (ref 12.0–15.0)
MCH: 28.8 pg (ref 26.0–34.0)
MCHC: 31.3 g/dL (ref 30.0–36.0)
MCV: 92.1 fL (ref 78.0–100.0)
PLATELETS: 227 10*3/uL (ref 150–400)
RBC: 4.68 MIL/uL (ref 3.87–5.11)
RDW: 13.5 % (ref 11.5–15.5)
WBC: 10 10*3/uL (ref 4.0–10.5)

## 2017-07-07 LAB — I-STAT TROPONIN, ED
TROPONIN I, POC: 0.01 ng/mL (ref 0.00–0.08)
Troponin i, poc: 0 ng/mL (ref 0.00–0.08)

## 2017-07-07 NOTE — ED Triage Notes (Signed)
Pt states she was letting her dog in and had sudden onset of tightness in her chest with radiation to both jaws. She states the pain lasted 7-10 minutes. She states the pain has no resolved. Skin warm and dry.

## 2017-07-07 NOTE — Discharge Instructions (Signed)
As discussed, your evaluation today has been largely reassuring.  But, it is important that you monitor your condition carefully, and do not hesitate to return to the ED if you develop new, or concerning changes in your condition. ? ?Otherwise, please follow-up with your physician for appropriate ongoing care. ? ?

## 2017-07-07 NOTE — ED Provider Notes (Signed)
[facility] EMERGENCY DEPARTMENT Provider Note   CSN: [id number] Arrival date & time: 07/07/17  1237     History   Chief Complaint Chief Complaint  Patient presents with  . Chest Pain    HPI [name] is a 77 y.o. female.  HPI  Patient presents with concern of chest pain. Patient had episode of about 10 minutes of chest pain that began and ended 4 hours prior to my evaluation. She has no history of coronary disease, no pulmonary disease. She does have a history of arthritis, asthma, but denies any ongoing dyspnea. Patient was in her usual state of health, when she had a episode that had no clear precipitant, lasted about 10 minutes, and without any specific intervention. She was evaluated by EMS during that time, did not have transfer. However, after discussing her pain with her daughter, she was brought here for evaluation. She has had no recurrence of the pain, states that she feels back to her usual state of health. She is a retired [employer] scientist, and was preparing to go to tai chi today when her pain began.   Past Medical History:  Diagnosis Date  . Anemia   . Arthritis    osteoarthritis - shoulder, knees & hips  . Asthma   . Depression   . Diabetes mellitus without complication (HCC)    no meds  . Hypertension    Dr. Orinda Kenner manages BP, pt. reports MD has not found a need for treatment   . Hypothyroidism   . Sleep difficulties    had sleep study -2009, prior to gastric surgery, told that there was not a need for f/u  . Thyroid disease   . Vitamin B 12 deficiency     Patient Active Problem List   Diagnosis Date Noted  . Overactive bladder 09/03/2016  . Morbid obesity ([place]) 06/28/2015  . Hypercalcemia 12/29/2014  . Allergic rhinitis 07/20/2014  . History of gastric bypass 02/08/2014  . Osteoarthritis 10/05/2013  . Hyperlipidemia 01/05/2008  . CHEST WALL PAIN, ANTERIOR 10/09/2007  . Hypothyroidism 09/09/2007  . Well controlled type  2 diabetes mellitus ([place]) 07/18/2006  . Major depression, recurrent, full remission ([place]) 07/18/2006  . Essential hypertension 07/18/2006  . ASTHMA 07/18/2006    Past Surgical History:  Procedure Laterality Date  . ABDOMINAL HYSTERECTOMY    . BARIATRIC SURGERY    . CARDIAC CATHETERIZATION     [facility]- 30 yrs. ago  . CATARACT EXTRACTION, BILATERAL     late 2018  . OOPHORECTOMY    . pantallor arthrodesis with rod placement left foot    . TONSILLECTOMY    . TOTAL SHOULDER ARTHROPLASTY Left 03/12/2012   Procedure: TOTAL SHOULDER ARTHROPLASTY;  Surgeon: Marin Shutter, MD;  Location: [facility];  Service: Orthopedics;  Laterality: Left;     OB History   None      Home Medications    Prior to Admission medications   Medication Sig Start Date End Date Taking? Authorizing Provider  acetaminophen (TYLENOL) 500 MG tablet Take 1,000 mg by mouth every 8 (eight) hours as needed for mild pain or headache.   Yes [provider]  albuterol (PROVENTIL HFA;VENTOLIN HFA) 108 (90 Base) MCG/ACT inhaler Inhale 1-2 puffs into the lungs every 4 (four) hours as needed for wheezing or shortness of breath. 02/15/15  Yes Marin Olp, MD  amLODipine (NORVASC) 10 MG tablet Take 1 tablet (10 mg total) by mouth daily. 04/11/17  Yes Marin Olp, MD  aspirin 81 MG tablet Take 81 mg by mouth daily.   Yes [provider]  atorvastatin (LIPITOR) 20 MG tablet Take 1 tablet (20 mg total) by mouth once a week. 04/11/17  Yes Marin Olp, MD  Cholecalciferol (VITAMIN D) 1000 UNITS capsule Take 5,000 Units by mouth daily.    Yes [provider]  cyanocobalamin (,VITAMIN B-12,) 1000 MCG/ML injection Inject 1 mL (1,000 mcg total) into the muscle every 30 (thirty) days. Uses on amonthly basis 09/03/16  Yes Marin Olp, MD  desvenlafaxine (PRISTIQ) 100 MG 24 hr tablet Take 1 tablet (100 mg total) by mouth daily. 04/11/17  Yes Marin Olp, MD  diclofenac (VOLTAREN) 75 MG EC tablet  Take 1 tablet (75 mg total) by mouth 2 (two) times daily. 04/11/17  Yes Marin Olp, MD  FeFum-FePo-FA-B Cmp-C-Zn-Mn-Cu (SE-TAN PLUS) 162-115.2-1 MG CAPS Take 1 capsule by mouth 2 (two) times daily. 04/11/17  Yes Marin Olp, MD  fluticasone [facility] ALLERGY RELIEF) 50 MCG/ACT nasal spray Place 1 spray into both nostrils daily. 04/11/17  Yes Marin Olp, MD  HYDROcodone-acetaminophen (NORCO/VICODIN) 5-325 MG tablet Take 1 tablet by mouth every 6 (six) hours as needed. 06/04/17  Yes Marin Olp, MD  loperamide (IMODIUM) 2 MG capsule Take 4 mg by mouth as needed for diarrhea or loose stools.   Yes [provider]  montelukast (SINGULAIR) 10 MG tablet Take 1 tablet (10 mg total) by mouth daily with breakfast. 04/11/17  Yes Marin Olp, MD  Omega-3 Fatty Acids (FISH OIL) 1000 MG CAPS Take 1,000 mg by mouth 2 (two) times daily.    Yes [provider]  Propylene Glycol (SYSTANE COMPLETE OP) Apply 1 drop to eye daily.   Yes [provider]  TIROSINT 150 MCG CAPS TK 2 CS PO 2 OUT OF 3 DAYS IN THE MORNING ON EMPTY STOMACH FOR THYROID 11/28/15  Yes [provider]  Alcohol Swabs PADS Use to check blood sugar once a day 05/27/17   Marin Olp, MD  Blood Glucose Calibration (TRUE METRIX LEVEL 2) Normal SOLN Use to calibrate meter as needed 05/27/17   Marin Olp, MD  glucose blood test strip Test blood sugar once per day 05/27/17   Marin Olp, MD  TRUEPLUS LANCETS 30G MISC Check blood sugar once per day 05/27/17   Marin Olp, MD    Family History Family History  Problem Relation Age of Onset  . Stroke Mother   . Liver cancer Father   . Cancer Father        liver  . Breast cancer Neg Hx     Social History Social History   Tobacco Use  . Smoking status: Former Smoker    Packs/day: 0.70    Years: 20.00    Pack years: 14.00    Last attempt to quit: 03/04/1978    Years since quitting: 39.3  . Smokeless tobacco: Never Used    Substance Use Topics  . Alcohol use: Yes    Alcohol/week: 1.2 oz    Types: 2 Glasses of wine per week    Comment: with dinner  or social rare   . Drug use: No     Allergies   Flexeril [cyclobenzaprine]; Ace inhibitors; Azithromycin; and Codeine   Review of Systems Review of Systems  Constitutional:       Per HPI, otherwise negative  HENT:       Per HPI, otherwise negative  Respiratory:  Per HPI, otherwise negative  Cardiovascular:       Per HPI, otherwise negative  Gastrointestinal: Negative for vomiting.  Endocrine:       Negative aside from HPI  Genitourinary:       Neg aside from HPI   Musculoskeletal:       Per HPI, otherwise negative  Skin: Negative.   Neurological: Negative for syncope.     Physical Exam Updated Vital Signs BP 134/71   Pulse 64   Temp 98.1 F (36.7 C) (Oral)   Resp 10   SpO2 96%   Physical Exam  Constitutional: She is oriented to person, place, and time. She appears well-developed and well-nourished. No distress.  HENT:  Head: Normocephalic and atraumatic.  Eyes: Conjunctivae and EOM are normal.  Cardiovascular: Normal rate and regular rhythm.  Pulmonary/Chest: Effort normal and breath sounds normal. No stridor. No respiratory distress.  Abdominal: She exhibits no distension.    Musculoskeletal: She exhibits no edema.  Neurological: She is alert and oriented to person, place, and time. No cranial nerve deficit.  Skin: Skin is warm and dry.  Psychiatric: She has a normal mood and affect.  Nursing note and vitals reviewed.    ED Treatments / Results  Labs (all labs ordered are listed, but only abnormal results are displayed) Labs Reviewed  BASIC METABOLIC PANEL - Abnormal; Notable for the following components:      Result Value   Glucose, Bld 155 (*)    GFR calc non Af Amer 57 (*)    All other components within normal limits  CBC  I-STAT TROPONIN, ED  I-STAT TROPONIN, ED    EKG EKG  Interpretation  Date/Time:  Monday July 07 2017 12:45:13 EDT Ventricular Rate:  67 PR Interval:  164 QRS Duration: 90 QT Interval:  396 QTC Calculation: 418 R Axis:   14 Text Interpretation:  Normal sinus rhythm Anterior infarct , age undetermined Abnormal ECG Abnormal ekg Confirmed by Carmin Muskrat [phone]) on 07/07/2017 1:46:18 PM   Radiology Dg Chest 2 View  Result Date: 07/07/2017 CLINICAL DATA:  Chest pain for several hours EXAM: CHEST - 2 VIEW COMPARISON:  01/22/2014 FINDINGS: Cardiac shadow is within normal limits. Aortic calcifications are noted. The lungs are well aerated bilaterally without focal infiltrate or sizable effusion. Postsurgical changes in the left shoulder are again noted. IMPRESSION: No active cardiopulmonary disease. Electronically Signed   By: Inez Catalina M.D.   On: 07/07/2017 13:27    Procedures Procedures (including critical care time)    Initial Impression / Assessment and Plan / ED Course  I have reviewed the triage vital signs and the nursing notes.  Pertinent labs & imaging results that were available during my care of the patient were reviewed by me and considered in my medical decision making (see chart for details).  3:35 PM Repeat exam the patient is awake, alert, no distress, still has had no recurrence of her pain. 2 normal troponins, unremarkable EKG, normal chest x-ray, no ongoing pain, unremarkable physical exam and vital signs are reassuring for low suspicion along with coronary ischemia, no evidence for pulmonary disease. She does have mild reproducibility of the pain with palpation, suggesting a musculoskeletal etiology. Patient discharged in stable condition.  Final Clinical Impressions(s) / ED Diagnoses   Final diagnoses:  Atypical chest pain    ED Discharge Orders    None       Carmin Muskrat, MD 07/07/17 1535

## 2017-07-09 ENCOUNTER — Other Ambulatory Visit: Payer: Self-pay | Admitting: Endocrinology

## 2017-07-09 ENCOUNTER — Encounter: Payer: Self-pay | Admitting: Family Medicine

## 2017-07-09 DIAGNOSIS — M8580 Other specified disorders of bone density and structure, unspecified site: Secondary | ICD-10-CM

## 2017-07-10 ENCOUNTER — Other Ambulatory Visit: Payer: Self-pay

## 2017-07-10 ENCOUNTER — Encounter: Payer: Self-pay | Admitting: Family Medicine

## 2017-07-10 DIAGNOSIS — R0789 Other chest pain: Secondary | ICD-10-CM

## 2017-07-11 ENCOUNTER — Encounter: Payer: Self-pay | Admitting: Family Medicine

## 2017-07-16 ENCOUNTER — Encounter: Payer: Self-pay | Admitting: Family Medicine

## 2017-07-16 ENCOUNTER — Ambulatory Visit (INDEPENDENT_AMBULATORY_CARE_PROVIDER_SITE_OTHER): Payer: Medicare HMO | Admitting: Family Medicine

## 2017-07-16 VITALS — BP 120/66 | HR 67 | Temp 98.8°F | Ht 61.0 in | Wt 226.0 lb

## 2017-07-16 DIAGNOSIS — R071 Chest pain on breathing: Secondary | ICD-10-CM

## 2017-07-16 DIAGNOSIS — R0789 Other chest pain: Secondary | ICD-10-CM

## 2017-07-16 DIAGNOSIS — M85852 Other specified disorders of bone density and structure, left thigh: Secondary | ICD-10-CM | POA: Diagnosis not present

## 2017-07-16 DIAGNOSIS — F419 Anxiety disorder, unspecified: Secondary | ICD-10-CM | POA: Diagnosis not present

## 2017-07-16 MED ORDER — LORAZEPAM 0.5 MG PO TABS: 0.2500 mg | ORAL_TABLET | Freq: Every day | ORAL | 0 refills | Status: DC | PRN

## 2017-07-16 NOTE — Progress Notes (Signed)
Subjective:  [name] is a 77 y.o. year old very pleasant female patient who presents for/with See problem oriented charting ROS- no recurrent chest pain since leaving hospital. No shortness of breath. Admits to anxiety and high stress levels. No SI reported.    Past Medical History-  Patient Active Problem List   Diagnosis Date Noted  . Hypercalcemia 12/29/2014    Priority: High  . Well controlled type 2 diabetes mellitus ([place]) 07/18/2006    Priority: High  . Overactive bladder 09/03/2016    Priority: Medium  . Morbid obesity ([place]) 06/28/2015    Priority: Medium  . History of gastric bypass 02/08/2014    Priority: Medium  . Osteoarthritis 10/05/2013    Priority: Medium  . Hyperlipidemia 01/05/2008    Priority: Medium  . Hypothyroidism 09/09/2007    Priority: Medium  . Major depression, recurrent, full remission ([place]) 07/18/2006    Priority: Medium  . Essential hypertension 07/18/2006    Priority: Medium  . ASTHMA 07/18/2006    Priority: Medium  . Allergic rhinitis 07/20/2014    Priority: Low  . CHEST WALL PAIN, ANTERIOR 10/09/2007    Priority: Low  . Anxiety 07/17/2017    Medications- reviewed and updated Current Outpatient Medications  Medication Sig Dispense Refill  . acetaminophen (TYLENOL) 500 MG tablet Take 1,000 mg by mouth every 8 (eight) hours as needed for mild pain or headache.    . albuterol (PROVENTIL HFA;VENTOLIN HFA) 108 (90 Base) MCG/ACT inhaler Inhale 1-2 puffs into the lungs every 4 (four) hours as needed for wheezing or shortness of breath. 1 Inhaler 6  . Alcohol Swabs PADS Use to check blood sugar once a day 100 each 11  . amLODipine (NORVASC) 10 MG tablet Take 1 tablet (10 mg total) by mouth daily. 90 tablet 3  . aspirin 81 MG tablet Take 81 mg by mouth daily.    Marland Kitchen atorvastatin (LIPITOR) 20 MG tablet Take 1 tablet (20 mg total) by mouth once a week. 13 tablet 3  . Blood Glucose Calibration (TRUE METRIX LEVEL 2) Normal SOLN Use to calibrate  meter as needed 1 each 0  . Cholecalciferol (VITAMIN D) 1000 UNITS capsule Take 5,000 Units by mouth daily.     . cyanocobalamin (,VITAMIN B-12,) 1000 MCG/ML injection Inject 1 mL (1,000 mcg total) into the muscle every 30 (thirty) days. Uses on amonthly basis 10 mL 2  . desvenlafaxine (PRISTIQ) 100 MG 24 hr tablet Take 1 tablet (100 mg total) by mouth daily. 90 tablet 3  . diclofenac (VOLTAREN) 75 MG EC tablet Take 1 tablet (75 mg total) by mouth 2 (two) times daily. 180 tablet 0  . FeFum-FePo-FA-B Cmp-C-Zn-Mn-Cu (SE-TAN PLUS) 162-115.2-1 MG CAPS Take 1 capsule by mouth 2 (two) times daily. 180 capsule 3  . fluticasone (FLONASE ALLERGY RELIEF) 50 MCG/ACT nasal spray Place 1 spray into both nostrils daily. 16 g 3  . glucose blood test strip Test blood sugar once per day 100 each 3  . HYDROcodone-acetaminophen (NORCO/VICODIN) 5-325 MG tablet Take 1 tablet by mouth every 6 (six) hours as needed. 20 tablet 0  . loperamide (IMODIUM) 2 MG capsule Take 4 mg by mouth as needed for diarrhea or loose stools.    . montelukast (SINGULAIR) 10 MG tablet Take 1 tablet (10 mg total) by mouth daily with breakfast. 90 tablet 3  . Omega-3 Fatty Acids (FISH OIL) 1000 MG CAPS Take 1,000 mg by mouth 2 (two) times daily.     Marland Kitchen Propylene Glycol (  SYSTANE COMPLETE OP) Apply 1 drop to eye daily.    Marland Kitchen TIROSINT 150 MCG CAPS TK 2 CS PO 2 OUT OF 3 DAYS IN THE MORNING ON EMPTY STOMACH FOR THYROID  5  . TRUEPLUS LANCETS 30G MISC Check blood sugar once per day 100 each 3  . LORazepam (ATIVAN) 0.5 MG tablet Take 0.5-1 tablets (0.25-0.5 mg total) by mouth daily as needed for anxiety (do not drive for 8 hours after taking). 10 tablet 0   No current facility-administered medications for this visit.     Objective: BP 120/66 (BP Location: Left Arm, Patient Position: Sitting, Cuff Size: Large)   Pulse 67   Temp 98.8 F (37.1 C) (Oral)   Ht 5\' 1"  (1.549 m)   Wt 226 lb (102.5 kg)   SpO2 95%   BMI 42.70 kg/m  Gen: NAD, resting  comfortably CV: RRR no murmurs rubs or gallops Lungs: CTAB no crackles, wheeze, rhonchi Abdomen: soft/nontender/nondistended/normal bowel sounds. obese Ext: bilateral edema trace Skin: warm, dry Neuro: grossly normal, moves all extremities  Assessment/Plan:  Anxiety And overtreated hypothyroidism S: Patient presents with anxiety. GAD7 of 10 today. She reamins on pristiq 100mg   Stressors with pain in knees, had ER trip with chest pain- she thinks anxiety has really ramped up since the ER trip, worrying about chest pain. Has a hard time managing each days tasks- she wants to be active on one hand but wants to sit around on the other hand.   No chest pain since ER trip. No pain but has an anxious feeling through her chest since that time at times. She had reassuring EKG, normal troponins. Her pain started at 10 AM and she didn't go immediately to the emergency room but by the time EMS arrived pain had resolved. She had pain that radiated up into her neck bilaterally After talking to  Daughter- daughter who is dentist came and got her and took her to the ER.   With knee pain worse- Hydrocodone more regular and hints at may need sooner refill  She has an appointment with Dr. Tamala Julian in September but she is on the waiting list for sooner visit- no further chest pain since discharge.   She had to have her tirosint reduced recently by Dr. Elyse Hsu due to overtreated hypothyroidism.  A/P: I suspect some of her anxiety is from overtreated hypothyroidism which is now being adjusted. Stressors noted- we discussed behavioral health intervention. She does ask for some sparing xanax over next few weeks until thyroid normalizes - Would prefer something like ativan or klonopin - she agrees to try ativan. We discussed separating by 8 hours from her hydrocodone  Osteopenia of left hip Osteopenia 2 years ago noted on DEXA- Dr. Elyse Hsu wants this updated- will update and try to send him results  [place] Had recurrent chest wall pain recently- likely MSK or anxiety related. Has follow up with Dr. Tamala Julian as above- does have risk factors for CAD with age, HLD, HTN, DM- though story sounds atypical for cardiac cause   Future Appointments  Date Time Provider [place]  08/06/2017 10:30 AM [facility] DEXA 1 [facility]  09/29/2017  2:00 PM Belva Crome, MD [facility]  11/19/2017  8:30 AM Yong Channel Brayton Mars, MD [facility]   Lab/Order associations: Osteopenia of left hip - Plan: DG Bone Density  Anxiety  Meds ordered this encounter  Medications  . LORazepam (ATIVAN) 0.5 MG tablet    Sig: Take 0.5-1 tablets (  0.25-0.5 mg total) by mouth daily as needed for anxiety (do not drive for 8 hours after taking).    Dispense:  10 tablet    Refill:  0    Return precautions advised.  Garret Reddish, MD

## 2017-07-16 NOTE — Patient Instructions (Addendum)
Schedule your bone density test at check out desk. You may also call directly to X-ray at [phone] to schedule an appointment that is convenient for you.  - located [address] across the street from [place] - in the basement - you do need an appointment for the bone density tests.   Consider [place] [facility] to help you deal with stressors  Glad you are seeing Dr. Tamala Julian  For hydrocodone also have to separate that by 8 hours from your ativan use. This is a high risk medication combo so we want to avoid that.   Can use sparing ativan over next few weeks as thyroid normalizes/adjusts to hopefully normal levels

## 2017-07-17 DIAGNOSIS — M85852 Other specified disorders of bone density and structure, left thigh: Secondary | ICD-10-CM | POA: Insufficient documentation

## 2017-07-17 DIAGNOSIS — F419 Anxiety disorder, unspecified: Secondary | ICD-10-CM | POA: Insufficient documentation

## 2017-07-17 NOTE — Assessment & Plan Note (Signed)
Osteopenia 2 years ago noted on DEXA- Dr. Elyse Hsu wants this updated- will update and try to send him results

## 2017-07-17 NOTE — Assessment & Plan Note (Signed)
And overtreated hypothyroidism S: Patient presents with anxiety. GAD7 of 10 today. She reamins on pristiq 100mg   Stressors with pain in knees, had ER trip with chest pain- she thinks anxiety has really ramped up since the ER trip, worrying about chest pain. Has a hard time managing each days tasks- she wants to be active on one hand but wants to sit around on the other hand.   No chest pain since ER trip. No pain but has an anxious feeling through her chest since that time at times. She had reassuring EKG, normal troponins. Her pain started at 10 AM and she didn't go immediately to the emergency room but by the time EMS arrived pain had resolved. She had pain that radiated up into her neck bilaterally After talking to  Daughter- daughter who is dentist came and got her and took her to the ER.   With knee pain worse- Hydrocodone more regular and hints at may need sooner refill  She has an appointment with Dr. Tamala Julian in September but she is on the waiting list for sooner visit- no further chest pain since discharge.   She had to have her tirosint reduced recently by Dr. Elyse Hsu due to overtreated hypothyroidism.  A/P: I suspect some of her anxiety is from overtreated hypothyroidism which is now being adjusted. Stressors noted- we discussed behavioral health intervention. She does ask for some sparing xanax over next few weeks until thyroid normalizes - Would prefer something like ativan or klonopin - she agrees to try ativan. We discussed separating by 8 hours from her hydrocodone

## 2017-07-17 NOTE — Assessment & Plan Note (Signed)
Had recurrent chest wall pain recently- likely MSK or anxiety related. Has follow up with Dr. Tamala Julian as above- does have risk factors for CAD with age, HLD, HTN, DM- though story sounds atypical for cardiac cause

## 2017-08-06 ENCOUNTER — Ambulatory Visit (INDEPENDENT_AMBULATORY_CARE_PROVIDER_SITE_OTHER)
Admission: RE | Admit: 2017-08-06 | Discharge: 2017-08-06 | Disposition: A | Discharge status: Home / Self Care | Payer: Medicare HMO | Source: Ambulatory Visit | Attending: Family Medicine | Admitting: Family Medicine

## 2017-08-06 DIAGNOSIS — M85852 Other specified disorders of bone density and structure, left thigh: Secondary | ICD-10-CM

## 2017-08-06 LAB — HM DEXA SCAN

## 2017-08-11 ENCOUNTER — Other Ambulatory Visit: Payer: Self-pay | Admitting: Family Medicine

## 2017-08-29 ENCOUNTER — Other Ambulatory Visit: Payer: Self-pay | Admitting: Family Medicine

## 2017-08-29 ENCOUNTER — Telehealth: Payer: Self-pay | Admitting: Family Medicine

## 2017-08-29 NOTE — Telephone Encounter (Signed)
Per stop act- hydrocodone and above require office visit- no refills by refill request unfortunately

## 2017-08-29 NOTE — Telephone Encounter (Signed)
Copied from [place] [id number]. Topic: Quick Communication - Rx Refill/Question >> Aug 29, 2017 11:53 AM Antonieta Iba C wrote: Medication: HYDROcodone-acetaminophen (NORCO/VICODIN) 5-325 MG tablet  Has the patient contacted their pharmacy? No  (Agent: If no, request that the patient contact the pharmacy for the refill.) (Agent: If yes, when and what did the pharmacy advise?)  Preferred Pharmacy (with phone number or street name): [facility], [place] [phone] (Phone) [phone] (Fax)    Agent: Please be advised that RX refills may take up to 3 business days. We ask that you follow-up with your pharmacy.

## 2017-08-29 NOTE — Telephone Encounter (Signed)
Norco/Vicodin 5-325 mg refill Last Refill:06/04/17  Last OV: 11/19/17 PCP: Yong Channel Pharmacy:[facility] [phone]

## 2017-08-29 NOTE — Telephone Encounter (Signed)
Request sent to Dr. Yong Channel for refill

## 2017-09-01 ENCOUNTER — Encounter: Payer: Self-pay | Admitting: Family Medicine

## 2017-09-01 NOTE — Telephone Encounter (Signed)
I routed this to you "Per stop act- hydrocodone and above require office visit- no refills by refill request unfortunately"  And you routed it back to me again- not sure why. Please inform patient that we need to have an office visit

## 2017-09-02 NOTE — Telephone Encounter (Signed)
Patient is scheduled   

## 2017-09-02 NOTE — Telephone Encounter (Signed)
Ellie-pls see message.  Can you please call this pt and have her schedule office visit.  We cannot fill her Hydrocodone rx until she is seen.  Thanks!

## 2017-09-04 ENCOUNTER — Encounter: Payer: Self-pay | Admitting: Family Medicine

## 2017-09-04 ENCOUNTER — Ambulatory Visit (INDEPENDENT_AMBULATORY_CARE_PROVIDER_SITE_OTHER): Payer: Medicare HMO | Admitting: Family Medicine

## 2017-09-04 VITALS — BP 132/76 | HR 72 | Temp 98.9°F | Ht 61.0 in | Wt 232.6 lb

## 2017-09-04 DIAGNOSIS — I10 Essential (primary) hypertension: Secondary | ICD-10-CM | POA: Diagnosis not present

## 2017-09-04 DIAGNOSIS — M159 Polyosteoarthritis, unspecified: Secondary | ICD-10-CM

## 2017-09-04 MED ORDER — HYDROCODONE-ACETAMINOPHEN 5-325 MG PO TABS: 1.0000 | ORAL_TABLET | Freq: Four times a day (QID) | ORAL | 0 refills | Status: DC | PRN

## 2017-09-04 NOTE — Assessment & Plan Note (Signed)
S: patient with long term issues with chronic pain in low back and right knee (OA and she wants to avoid surgery). We last filled vicodin for her 06/04/17 with # 20. Prior to that last refill was on 01/17/16. She has also had 3 hylauronic acid injections since last visit and was hoping that would help- she had a fall in between injections and fell onto both knees- that seemed to exacerbate pain. Really needed it more during that- about twice a day for 3-4 days. Also recently went to [place] - tripped while out in a friend's driveway and fell onto left knee. Already known bone on bone arthritis. Also downsizing house and moving a lot more. Left knee has been worse as took brunt of the fall. Taking about one a day lately with a tylenol 500mg  So total of 825 mg. Also takes a tylenol before bed. Rarely using it for her back- mainly the knees right now.   She also uses sparing lorazepam with #10 given 07/16/17 for anxiety while thyroid normalizes- was hyperthyroid at the time- she hasnt had to use this thankfully. She knows that should be separated by 6 hours from vicodin.  A/P: OA knees trying to avoid surgery as long as possible. Worsening pain due to falls. Luckily no concurrent use of benzodiazepines  - [facility] reviewed- in last 2 years hydrocodone #40 total from me and #10 lorazepam 0.5mg . No other prescribers listed. - UDS up to date on 06/04/17 -encouraged more regular use of cane to help avoid cane as these were key triggers of her worsening pain

## 2017-09-04 NOTE — Progress Notes (Signed)
Subjective:  [name] is a 77 y.o. year old very pleasant female patient who presents for/with See problem oriented charting ROS- continued knee pain. Intermittent back pain. Stable edema.    Past Medical History-  Patient Active Problem List   Diagnosis Date Noted  . Hypercalcemia 12/29/2014    Priority: High  . Well controlled type 2 diabetes mellitus ([place]) 07/18/2006    Priority: High  . Overactive bladder 09/03/2016    Priority: Medium  . Morbid obesity ([place]) 06/28/2015    Priority: Medium  . History of gastric bypass 02/08/2014    Priority: Medium  . Osteoarthritis 10/05/2013    Priority: Medium  . Hyperlipidemia 01/05/2008    Priority: Medium  . Hypothyroidism 09/09/2007    Priority: Medium  . Major depression, recurrent, full remission ([place]) 07/18/2006    Priority: Medium  . Essential hypertension 07/18/2006    Priority: Medium  . ASTHMA 07/18/2006    Priority: Medium  . Allergic rhinitis 07/20/2014    Priority: Low  . CHEST WALL PAIN, ANTERIOR 10/09/2007    Priority: Low  . Anxiety 07/17/2017  . Osteopenia of left hip 07/17/2017    Medications- reviewed and updated Current Outpatient Medications  Medication Sig Dispense Refill  . acetaminophen (TYLENOL) 500 MG tablet Take 1,000 mg by mouth every 8 (eight) hours as needed for mild pain or headache.    . albuterol (PROVENTIL HFA;VENTOLIN HFA) 108 (90 Base) MCG/ACT inhaler Inhale 1-2 puffs into the lungs every 4 (four) hours as needed for wheezing or shortness of breath. 1 Inhaler 6  . Alcohol Swabs PADS Use to check blood sugar once a day 100 each 11  . amLODipine (NORVASC) 10 MG tablet Take 1 tablet (10 mg total) by mouth daily. 90 tablet 3  . aspirin 81 MG tablet Take 81 mg by mouth daily.    Marland Kitchen atorvastatin (LIPITOR) 20 MG tablet Take 1 tablet (20 mg total) by mouth once a week. 13 tablet 3  . Blood Glucose Calibration (TRUE METRIX LEVEL 2) Normal SOLN Use to calibrate meter as needed 1 each 0  .  Cholecalciferol (VITAMIN D) 1000 UNITS capsule Take 5,000 Units by mouth daily.     . cyanocobalamin (,VITAMIN B-12,) 1000 MCG/ML injection Inject 1 mL (1,000 mcg total) into the muscle every 30 (thirty) days. Uses on amonthly basis 10 mL 2  . desvenlafaxine (PRISTIQ) 100 MG 24 hr tablet Take 1 tablet (100 mg total) by mouth daily. 90 tablet 3  . diclofenac (VOLTAREN) 75 MG EC tablet TAKE 1 TABLET TWICE DAILY 180 tablet 0  . FeFum-FePo-FA-B Cmp-C-Zn-Mn-Cu (SE-TAN PLUS) 162-115.2-1 MG CAPS Take 1 capsule by mouth 2 (two) times daily. 180 capsule 3  . fluticasone (FLONASE ALLERGY RELIEF) 50 MCG/ACT nasal spray Place 1 spray into both nostrils daily. 16 g 3  . glucose blood test strip Test blood sugar once per day 100 each 3  . HYDROcodone-acetaminophen (NORCO/VICODIN) 5-325 MG tablet Take 1 tablet by mouth every 6 (six) hours as needed. 20 tablet 0  . loperamide (IMODIUM) 2 MG capsule Take 4 mg by mouth as needed for diarrhea or loose stools.    Marland Kitchen LORazepam (ATIVAN) 0.5 MG tablet Take 0.5-1 tablets (0.25-0.5 mg total) by mouth daily as needed for anxiety (do not drive for 8 hours after taking). 10 tablet 0  . montelukast (SINGULAIR) 10 MG tablet Take 1 tablet (10 mg total) by mouth daily with breakfast. 90 tablet 3  . Omega-3 Fatty Acids (FISH OIL) 1000  MG CAPS Take 1,000 mg by mouth 2 (two) times daily.     Marland Kitchen Propylene Glycol (SYSTANE COMPLETE OP) Apply 1 drop to eye daily.    Marland Kitchen TIROSINT 150 MCG CAPS TK 2 CS PO 2 OUT OF 3 DAYS IN THE MORNING ON EMPTY STOMACH FOR THYROID  5  . TRUEPLUS LANCETS 30G MISC Check blood sugar once per day 100 each 3   Objective: BP 132/76 (BP Location: Left Arm, Patient Position: Sitting, Cuff Size: Large)   Pulse 72   Temp 98.9 F (37.2 C) (Oral)   Ht _0  (1.549 m)   Wt 232 lb 9.6 oz (105.5 kg)   SpO2 96%   BMI 43.95 kg/m  Gen: NAD, resting comfortably CV: RRR no murmurs rubs or gallops Lungs: CTAB no crackles, wheeze, rhonchi Abdomen:  soft/nontender/nondistended/normal bowel sounds. No rebound or guarding.  Ext: 1+ edema Skin: warm, dry, erythema on right knee around scab and some bruising  Assessment/Plan:  Other notes: 1.not back to tai chi- plans to wait until knees more steady 2. 230 last October - was losing- now with being more sedentary for knees trending up- hoping we can get her more mobile again  Osteoarthritis S: patient with long term issues with chronic pain in low back and right knee (OA and she wants to avoid surgery). We last filled vicodin for her 06/04/17 with # 20. Prior to that last refill was on 01/17/16. She has also had 3 hylauronic acid injections since last visit and was hoping that would help- she had a fall in between injections and fell onto both knees- that seemed to exacerbate pain. Really needed it more during that- about twice a day for 3-4 days. Also recently went to [place] - tripped while out in a friend's driveway and fell onto left knee. Already known bone on bone arthritis. Also downsizing house and moving a lot more. Left knee has been worse as took brunt of the fall. Taking about one a day lately with a tylenol 551m So total of 825 mg. Also takes a tylenol before bed. Rarely using it for her back- mainly the knees right now.   She also uses sparing lorazepam with #10 given 07/16/17 for anxiety while thyroid normalizes- was hyperthyroid at the time- she hasnt had to use this thankfully. She knows that should be separated by 6 hours from vicodin.  A/P: OA knees trying to avoid surgery as long as possible. Worsening pain due to falls. Luckily no concurrent use of benzodiazepines  - [facility] reviewed- in last 2 years hydrocodone #40 total from me and #10 lorazepam 0.585m No other prescribers listed. - UDS up to date on 06/04/17 -encouraged more regular use of cane to help avoid cane as these were key triggers of her worsening pain   Essential hypertension S: controlled on amlodipine 1030mP  Readings from Last 3 Encounters:  09/04/17 132/76  07/16/17 120/66  07/07/17 134/71  A/P: We discussed blood pressure goal of <140/90. Continue current meds   Future Appointments  Date Time Provider DepCouder[place]/09/2017  2:00 PM SmiBelva C[facility]D [facility]  11/19/2017  8:30 AM HunYong ChanneleBrayton MarsD [facility]   Meds ordered this encounter  Medications  . HYDROcodone-acetaminophen (NORCO/VICODIN) 5-325 MG tablet    Sig: Take 1 tablet by mouth every 6 (six) hours as needed.    Dispense:  30 tablet    Refill:  0    OA of the knees-Chronic pain  Return precautions advised.  Garret Reddish, MD

## 2017-09-04 NOTE — Patient Instructions (Addendum)
Health Maintenance Due  Topic Date Due  . INFLUENZA VACCINE -Schedule for this Fall 08/21/2017   Refilled #30 today of hydrocodone- to help since pain has been worse lately- plan is to try to space this at least through October visit or may last you longer

## 2017-09-04 NOTE — Assessment & Plan Note (Signed)
S: controlled on amlodipine 10mg  BP Readings from Last 3 Encounters:  09/04/17 132/76  07/16/17 120/66  07/07/17 134/71  A/P: We discussed blood pressure goal of <140/90. Continue current meds

## 2017-09-19 ENCOUNTER — Other Ambulatory Visit: Payer: Self-pay | Admitting: Family Medicine

## 2017-09-29 ENCOUNTER — Encounter

## 2017-09-29 ENCOUNTER — Ambulatory Visit: Payer: Medicare HMO | Admitting: Interventional Cardiology

## 2017-09-29 ENCOUNTER — Encounter: Payer: Self-pay | Admitting: Interventional Cardiology

## 2017-09-29 VITALS — BP 148/92 | HR 83 | Ht 61.0 in | Wt 231.4 lb

## 2017-09-29 DIAGNOSIS — E785 Hyperlipidemia, unspecified: Secondary | ICD-10-CM | POA: Diagnosis not present

## 2017-09-29 DIAGNOSIS — I209 Angina pectoris, unspecified: Secondary | ICD-10-CM | POA: Diagnosis not present

## 2017-09-29 DIAGNOSIS — I10 Essential (primary) hypertension: Secondary | ICD-10-CM

## 2017-09-29 DIAGNOSIS — Z9884 Bariatric surgery status: Secondary | ICD-10-CM | POA: Diagnosis not present

## 2017-09-29 MED ORDER — NITROGLYCERIN 0.4 MG SL SUBL: 0.4000 mg | SUBLINGUAL_TABLET | SUBLINGUAL | 6 refills | Status: DC | PRN

## 2017-09-29 NOTE — Patient Instructions (Addendum)
Medication Instructions:  Your physician recommends that you continue on your current medications as directed. Please refer to the Current Medication list given to you today. A prescription for nitroglycerin has been sent to your pharmacy to use as needed  Labwork: none  Testing/Procedures: Dr. Tamala Julian would like you to have a  Calcium Score CT Scan  Follow-Up: Your physician wants you to follow-up in: 6 months.  You will receive a reminder letter in the mail two months in advance. If you don't receive a letter, please call our office to schedule the follow-up appointment.   Any Other Special Instructions Will Be Listed Below (If Applicable).     If you need a refill on your cardiac medications before your next appointment, please call your pharmacy.  Nitroglycerin sublingual tablets What is this medicine? NITROGLYCERIN (nye troe GLI ser in) is a type of vasodilator. It relaxes blood vessels, increasing the blood and oxygen supply to your heart. This medicine is used to relieve chest pain caused by angina. It is also used to prevent chest pain before activities like climbing stairs, going outdoors in cold weather, or sexual activity. This medicine may be used for other purposes; ask your health care provider or pharmacist if you have questions. COMMON BRAND NAME(S): Nitroquick, Nitrostat, Nitrotab What should I tell my health care provider before I take this medicine? They need to know if you have any of these conditions: -anemia -head injury, recent stroke, or bleeding in the brain -liver disease -previous heart attack -an unusual or allergic reaction to nitroglycerin, other medicines, foods, dyes, or preservatives -pregnant or trying to get pregnant -breast-feeding How should I use this medicine? Take this medicine by mouth as needed. At the first sign of an angina attack (chest pain or tightness) place one tablet under your tongue. You can also take this medicine 5 to 10 minutes  before an event likely to produce chest pain. Follow the directions on the prescription label. Let the tablet dissolve under the tongue. Do not swallow whole. Replace the dose if you accidentally swallow it. It will help if your mouth is not dry. Saliva around the tablet will help it to dissolve more quickly. Do not eat or drink, smoke or chew tobacco while a tablet is dissolving. If you are not better within 5 minutes after taking ONE dose of nitroglycerin, call 9-1-1 immediately to seek emergency medical care. Do not take more than 3 nitroglycerin tablets over 15 minutes. If you take this medicine often to relieve symptoms of angina, your doctor or health care professional may provide you with different instructions to manage your symptoms. If symptoms do not go away after following these instructions, it is important to call 9-1-1 immediately. Do not take more than 3 nitroglycerin tablets over 15 minutes. Talk to your pediatrician regarding the use of this medicine in children. Special care may be needed. Overdosage: If you think you have taken too much of this medicine contact a poison control center or emergency room at once. NOTE: This medicine is only for you. Do not share this medicine with others. What if I miss a dose? This does not apply. This medicine is only used as needed. What may interact with this medicine? Do not take this medicine with any of the following medications: -certain migraine medicines like ergotamine and dihydroergotamine (DHE) -medicines used to treat erectile dysfunction like sildenafil, tadalafil, and vardenafil -riociguat This medicine may also interact with the following medications: -alteplase -aspirin -heparin -medicines for high blood  pressure -medicines for mental depression -other medicines used to treat angina -phenothiazines like chlorpromazine, mesoridazine, prochlorperazine, thioridazine This list may not describe all possible interactions. Give your  health care provider a list of all the medicines, herbs, non-prescription drugs, or dietary supplements you use. Also tell them if you smoke, drink alcohol, or use illegal drugs. Some items may interact with your medicine. What should I watch for while using this medicine? Tell your doctor or health care professional if you feel your medicine is no longer working. Keep this medicine with you at all times. Sit or lie down when you take your medicine to prevent falling if you feel dizzy or faint after using it. Try to remain calm. This will help you to feel better faster. If you feel dizzy, take several deep breaths and lie down with your feet propped up, or bend forward with your head resting between your knees. You may get drowsy or dizzy. Do not drive, use machinery, or do anything that needs mental alertness until you know how this drug affects you. Do not stand or sit up quickly, especially if you are an older patient. This reduces the risk of dizzy or fainting spells. Alcohol can make you more drowsy and dizzy. Avoid alcoholic drinks. Do not treat yourself for coughs, colds, or pain while you are taking this medicine without asking your doctor or health care professional for advice. Some ingredients may increase your blood pressure. What side effects may I notice from receiving this medicine? Side effects that you should report to your doctor or health care professional as soon as possible: -blurred vision -dry mouth -skin rash -sweating -the feeling of extreme pressure in the head -unusually weak or tired Side effects that usually do not require medical attention (report to your doctor or health care professional if they continue or are bothersome): -flushing of the face or neck -headache -irregular heartbeat, palpitations -nausea, vomiting This list may not describe all possible side effects. Call your doctor for medical advice about side effects. You may report side effects to FDA at  1-800-FDA-1088. Where should I keep my medicine? Keep out of the reach of children. Store at room temperature between 20 and 25 degrees C (68 and 77 degrees F). Store in [facility] [employer]. Protect from light and moisture. Keep tightly closed. Throw away any unused medicine after the expiration date. NOTE: This sheet is a summary. It may not cover all possible information. If you have questions about this medicine, talk to your doctor, pharmacist, or health care provider.  2018 [employer]/Gold Standard (2012-11-05 17:57:36)

## 2017-09-29 NOTE — Progress Notes (Signed)
Cardiology Office Note:    Date:  09/29/2017   ID:  [name], DOB Aug 09, 1940, MRN [id number]  PCP:  Marin Olp, MD  Cardiologist:  No primary care provider on file.   Referring MD: Marin Olp, MD   Chief Complaint  Patient presents with  . Coronary Artery Disease    angina pectoris    History of Present Illness:    [name] is a 77 y.o. female with a hx of obesity, diabetes mellitus, bariatric surgery, hyperlipidemia, and hypertension who is referred for evaluation of angina pectoris.  The patient has had one episode of classical angina pectoris occurring 3 months ago.  The duration of the episode was 10 minutes and resolve spontaneously.  She describes the discomfort as substernal pressure with radiation into the inner aspect of the left arm.  It occurred while she was getting up to take her dogs out to walk and also in preparation to go to her exercise tai chi class.  By the time EMS arrived the discomfort had resolved.  She did go to the emergency room after encouragement where a rule out protocol was initiated.  Delta troponins were unremarkable.  ECG demonstrated poor R wave progression but no acute ST-T wave abnormality.  Had a normal coronary angiogram performed greater than 30 years ago.  Risk factors include family history of CAD with brother undergoing CABG before age 101.  Mother had CVA.  Patient is a prior smoker.  Discontinued smoking at age 61.  Past Medical History:  Diagnosis Date  . Anemia   . Arthritis    osteoarthritis - shoulder, knees & hips  . Asthma   . Depression   . Diabetes mellitus without complication (HCC)    no meds  . Hypertension    Dr. Orinda Kenner manages BP, pt. reports MD has not found a need for treatment   . Hypothyroidism   . Sleep difficulties    had sleep study -2009, prior to gastric surgery, told that there was not a need for f/u  . Thyroid disease   . Vitamin B 12 deficiency     Past Surgical History:   Procedure Laterality Date  . ABDOMINAL HYSTERECTOMY    . BARIATRIC SURGERY    . CARDIAC CATHETERIZATION     [facility]- 30 yrs. ago  . CATARACT EXTRACTION, BILATERAL     late 2018  . OOPHORECTOMY    . pantallor arthrodesis with rod placement left foot    . TONSILLECTOMY    . TOTAL SHOULDER ARTHROPLASTY Left 03/12/2012   Procedure: TOTAL SHOULDER ARTHROPLASTY;  Surgeon: Marin Shutter, MD;  Location: [facility];  Service: Orthopedics;  Laterality: Left;    Current Medications: Current Meds  Medication Sig  . acetaminophen (TYLENOL) 500 MG tablet Take 1,000 mg by mouth every 8 (eight) hours as needed for mild pain or headache.  . albuterol (PROVENTIL HFA;VENTOLIN HFA) 108 (90 Base) MCG/ACT inhaler Inhale 1-2 puffs into the lungs every 4 (four) hours as needed for wheezing or shortness of breath.  . Alcohol Swabs PADS Use to check blood sugar once a day  . amLODipine (NORVASC) 10 MG tablet Take 1 tablet (10 mg total) by mouth daily.  Marland Kitchen aspirin 81 MG tablet Take 81 mg by mouth daily.  Marland Kitchen atorvastatin (LIPITOR) 20 MG tablet Take 1 tablet (20 mg total) by mouth once a week.  . Blood Glucose Calibration (TRUE METRIX LEVEL 2) Normal SOLN Use to calibrate meter as needed  .  Cholecalciferol (VITAMIN D) 1000 UNITS capsule Take 5,000 Units by mouth daily.   . cyanocobalamin (,VITAMIN B-12,) 1000 MCG/ML injection Inject 1 mL (1,000 mcg total) into the muscle every 30 (thirty) days. Uses on amonthly basis  . desvenlafaxine (PRISTIQ) 100 MG 24 hr tablet Take 1 tablet (100 mg total) by mouth daily.  . diclofenac (VOLTAREN) 75 MG EC tablet TAKE 1 TABLET TWICE DAILY  . FeFum-FePo-FA-B Cmp-C-Zn-Mn-Cu (SE-TAN PLUS) 162-115.2-1 MG CAPS Take 1 capsule by mouth 2 (two) times daily.  . fluticasone (FLONASE) 50 MCG/ACT nasal spray USE 1 SPRAY IN EACH NOSTRIL EVERY DAY  . glucose blood test strip Test blood sugar once per day  . HYDROcodone-acetaminophen (NORCO/VICODIN) 5-325 MG tablet Take 1 tablet by mouth every 6 (six)  hours as needed.  . Levothyroxine Sodium (TIROSINT) 150 MCG CAPS Take 2 capsules by mouth 2 out of 3 days; in morning on empty stomach for thyroid (name brand only DAW)  . loperamide (IMODIUM) 2 MG capsule Take 4 mg by mouth as needed for diarrhea or loose stools.  Marland Kitchen LORazepam (ATIVAN) 0.5 MG tablet Take 0.5-1 tablets (0.25-0.5 mg total) by mouth daily as needed for anxiety (do not drive for 8 hours after taking).  . montelukast (SINGULAIR) 10 MG tablet Take 1 tablet (10 mg total) by mouth daily with breakfast.  . Omega-3 Fatty Acids (FISH OIL) 1000 MG CAPS Take 1,000 mg by mouth daily.   Marland Kitchen Propylene Glycol (SYSTANE COMPLETE OP) Apply 1 drop to eye daily.  . TRUEPLUS LANCETS 30G MISC Check blood sugar once per day     Allergies:   Flexeril [cyclobenzaprine]; Ace inhibitors; Azithromycin; and Codeine   Social History   Socioeconomic History  . Marital status: Widowed    Spouse name: Not on file  . Number of children: Not on file  . Years of education: Not on file  . Highest education level: Not on file  Occupational History  . Occupation: retired Optician, dispensing: RETIRED  Social Needs  . Financial resource strain: Not on file  . Food insecurity:    Worry: Not on file    Inability: Not on file  . Transportation needs:    Medical: Not on file    Non-medical: Not on file  Tobacco Use  . Smoking status: Former Smoker    Packs/day: 0.70    Years: 20.00    Pack years: 14.00    Last attempt to quit: 03/04/1978    Years since quitting: 39.6  . Smokeless tobacco: Never Used  Substance and Sexual Activity  . Alcohol use: Yes    Alcohol/week: 2.0 standard drinks    Types: 2 Glasses of wine per week    Comment: with dinner  or social rare   . Drug use: No  . Sexual activity: Not Currently  Lifestyle  . Physical activity:    Days per week: Not on file    Minutes per session: Not on file  . Stress: Not on file  Relationships  . Social connections:    Talks on phone: Not on file     Gets together: Not on file    Attends religious service: Not on file    Active member of club or organization: Not on file    Attends meetings of clubs or organizations: Not on file    Relationship status: Not on file  Other Topics Concern  . Not on file  Social History Narrative   Widowed 2013. 2 kids. 4 grandkids.  Oldest daughter lives in [place] and has 2 grandkids that are now in Scientist, physiological, PT school at [employer]       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 There is no history of Breast cancer.  ROS:   Please see the history of present illness.    She complains of depression, back pain, muscle discomfort, anxiety, difficulty with balance, leg pain, and excessive fatigue.  All other systems reviewed and are negative.  EKGs/Labs/Other Studies Reviewed:    The following studies were reviewed today: None  EKG:  EKG is not ordered today.  The ekg ordered 07/08/2017 while in the emergency room demonstrates poor R wave progression V1 through V4.  Left axis deviation also noted.  When compared to historical EKGs R wave progression abnormality appears slightly worse.  Recent Labs: 06/04/2017: ALT 22 07/07/2017: BUN 11; Creatinine, Ser 0.94; Hemoglobin 13.5; Platelets 227; Potassium 4.4; Sodium 136  Recent Lipid Panel    Component Value Date/Time   CHOL 185 10/22/2016 0904   TRIG 155.0 (H) 10/22/2016 0904   HDL 41.20 10/22/2016 0904   CHOLHDL 5 10/22/2016 0904   VLDL 31.0 10/22/2016 0904   LDLCALC 113 (H) 10/22/2016 0904   LDLDIRECT 109.0 01/04/2016 1347    Physical Exam:    VS:  BP (!) 148/92   Pulse 83   Ht 5' 1"  (1.549 m)   Wt 231 lb 6.4 oz (105 kg)   BMI 43.72 kg/m     Wt Readings from Last 3 Encounters:  09/29/17 231 lb 6.4 oz (105 kg)    09/04/17 232 lb 9.6 oz (105.5 kg)  07/16/17 226 lb (102.5 kg)     GEN: Obese well nourished, well developed in no acute distress HEENT: Normal NECK: No JVD. LYMPHATICS: No lymphadenopathy CARDIAC: RRR, no murmur,  nogallop, 1+ bilateral ankle edema. VASCULAR: 2+ bilateral radial pulses.  No bruits. RESPIRATORY:  Clear to auscultation without rales, wheezing or rhonchi  ABDOMEN: Soft, non-tender, non-distended, No pulsatile mass, MUSCULOSKELETAL: No deformity  SKIN: Warm and dry NEUROLOGIC:  Alert and oriented x 3 PSYCHIATRIC:  Normal affect   ASSESSMENT:    1. Angina pectoris ([place])   2. Hyperlipidemia, unspecified hyperlipidemia type   3. History of gastric bypass   4. Essential hypertension   5. Morbid obesity ([place])    PLAN:    In order of problems listed above:  1. Single episode of classical angina 3 months ago.  No recurrence since that time.  Episode lasted 10 minutes.  Injury was ruled out in the emergency room.  Multiple risk factors for CAD including obesity, diabetes mellitus type 2, hypertension, and age.  Plan coronary calcium score, sublingual nitroglycerin if recurrent chest discomfort, and further secondary risk prevention based upon coronary calcium score.  She is statin intolerant so we may have to refer the patient for PCSK9 therapy.  He should take one aspirin per day.  Blood pressure will need to be controlled to 130/80 mmHg or less.  Most recent hemoglobin A1c was 6.6. 2. LDL target should be less than 70. 3. Gaining weight despite the surgery. 4. Will determine what additional antihypertensive therapy after results of the calcium score are known. 5. Plant-based diet discussed.  Coronary calcium score, secondary risk prevention  based upon database.  Sublingual PRN nitroglycerin if recurrent chest pain.  If high risk coronary calcium score may need to consider coronary angiography or coronary CT.   Medication Adjustments/Labs and Tests Ordered: Current  medicines are reviewed at length with the patient today.  Concerns regarding medicines are outlined above.  Orders Placed This Encounter  Procedures  . CT CARDIAC SCORING   Meds ordered this encounter  Medications  . nitroGLYCERIN (NITROSTAT) 0.4 MG SL tablet    Sig: Place 1 tablet (0.4 mg total) under the tongue every 5 (five) minutes as needed for chest pain.    Dispense:  25 tablet    Refill:  6    Patient Instructions  Medication Instructions:  Your physician recommends that you continue on your current medications as directed. Please refer to the Current Medication list given to you today. A prescription for nitroglycerin has been sent to your pharmacy to use as needed  Labwork: none  Testing/Procedures: Dr. Tamala Julian would like you to have a  Calcium Score CT Scan  Follow-Up: Your physician wants you to follow-up in: 6 months.  You will receive a reminder letter in the mail two months in advance. If you don't receive a letter, please call our office to schedule the follow-up appointment.   Any Other Special Instructions Will Be Listed Below (If Applicable).     If you need a refill on your cardiac medications before your next appointment, please call your pharmacy.  Nitroglycerin sublingual tablets What is this medicine? NITROGLYCERIN (nye troe GLI ser in) is a type of vasodilator. It relaxes blood vessels, increasing the blood and oxygen supply to your heart. This medicine is used to relieve chest pain caused by angina. It is also used to prevent chest pain before activities like climbing stairs, going outdoors in cold weather, or sexual activity. This medicine may be used for other purposes; ask your health care provider or pharmacist if you have questions. COMMON BRAND NAME(S): Nitroquick, Nitrostat, Nitrotab What should I tell my health care provider before I take this medicine? They need to know if you have any of these conditions: -anemia -head injury, recent  stroke, or bleeding in the brain -liver disease -previous heart attack -an unusual or allergic reaction to nitroglycerin, other medicines, foods, dyes, or preservatives -pregnant or trying to get pregnant -breast-feeding How should I use this medicine? Take this medicine by mouth as needed. At the first sign of an angina attack (chest pain or tightness) place one tablet under your tongue. You can also take this medicine 5 to 10 minutes before an event likely to produce chest pain. Follow the directions on the prescription label. Let the tablet dissolve under the tongue. Do not swallow whole. Replace the dose if you accidentally swallow it. It will help if your mouth is not dry. Saliva around the tablet will help it to dissolve more quickly. Do not eat or drink, smoke or chew tobacco while a tablet is dissolving. If you are not better within 5 minutes after taking ONE dose of nitroglycerin, call 9-1-1 immediately to seek emergency medical care. Do not take more than 3 nitroglycerin tablets over 15 minutes. If you take this medicine often to relieve symptoms of angina, your doctor or health care professional may provide you with different instructions to manage your symptoms. If symptoms do not go away after following these instructions, it is important to call 9-1-1 immediately. Do not take more than 3 nitroglycerin tablets over 15 minutes. Talk to  your pediatrician regarding the use of this medicine in children. Special care may be needed. Overdosage: If you think you have taken too much of this medicine contact a poison control center or emergency room at once. NOTE: This medicine is only for you. Do not share this medicine with others. What if I miss a dose? This does not apply. This medicine is only used as needed. What may interact with this medicine? Do not take this medicine with any of the following medications: -certain migraine medicines like ergotamine and dihydroergotamine  (DHE) -medicines used to treat erectile dysfunction like sildenafil, tadalafil, and vardenafil -riociguat This medicine may also interact with the following medications: -alteplase -aspirin -heparin -medicines for high blood pressure -medicines for mental depression -other medicines used to treat angina -phenothiazines like chlorpromazine, mesoridazine, prochlorperazine, thioridazine This list may not describe all possible interactions. Give your health care provider a list of all the medicines, herbs, non-prescription drugs, or dietary supplements you use. Also tell them if you smoke, drink alcohol, or use illegal drugs. Some items may interact with your medicine. What should I watch for while using this medicine? Tell your doctor or health care professional if you feel your medicine is no longer working. Keep this medicine with you at all times. Sit or lie down when you take your medicine to prevent falling if you feel dizzy or faint after using it. Try to remain calm. This will help you to feel better faster. If you feel dizzy, take several deep breaths and lie down with your feet propped up, or bend forward with your head resting between your knees. You may get drowsy or dizzy. Do not drive, use machinery, or do anything that needs mental alertness until you know how this drug affects you. Do not stand or sit up quickly, especially if you are an older patient. This reduces the risk of dizzy or fainting spells. Alcohol can make you more drowsy and dizzy. Avoid alcoholic drinks. Do not treat yourself for coughs, colds, or pain while you are taking this medicine without asking your doctor or health care professional for advice. Some ingredients may increase your blood pressure. What side effects may I notice from receiving this medicine? Side effects that you should report to your doctor or health care professional as soon as possible: -blurred vision -dry mouth -skin rash -sweating -the  feeling of extreme pressure in the head -unusually weak or tired Side effects that usually do not require medical attention (report to your doctor or health care professional if they continue or are bothersome): -flushing of the face or neck -headache -irregular heartbeat, palpitations -nausea, vomiting This list may not describe all possible side effects. Call your doctor for medical advice about side effects. You may report side effects to FDA at 1-800-FDA-1088. Where should I keep my medicine? Keep out of the reach of children. Store at room temperature between 20 and 25 degrees C (68 and 77 degrees F). Store in Chief of Staff. Protect from light and moisture. Keep tightly closed. Throw away any unused medicine after the expiration date. NOTE: This sheet is a summary. It may not cover all possible information. If you have questions about this medicine, talk to your doctor, pharmacist, or health care provider.  2018 Elsevier/Gold Standard (2012-11-05 17:57:36)     Signed, Sinclair Grooms, MD  09/29/2017 3:20 PM    [facility]

## 2017-10-09 DIAGNOSIS — M25562 Pain in left knee: Secondary | ICD-10-CM | POA: Diagnosis not present

## 2017-10-09 DIAGNOSIS — M170 Bilateral primary osteoarthritis of knee: Secondary | ICD-10-CM | POA: Diagnosis not present

## 2017-10-09 DIAGNOSIS — M25561 Pain in right knee: Secondary | ICD-10-CM | POA: Diagnosis not present

## 2017-10-10 ENCOUNTER — Telehealth: Payer: Self-pay | Admitting: *Deleted

## 2017-10-10 ENCOUNTER — Ambulatory Visit (INDEPENDENT_AMBULATORY_CARE_PROVIDER_SITE_OTHER)
Admission: RE | Admit: 2017-10-10 | Discharge: 2017-10-10 | Disposition: A | Discharge status: Home / Self Care | Payer: Self-pay | Source: Ambulatory Visit | Attending: Interventional Cardiology | Admitting: Interventional Cardiology

## 2017-10-10 DIAGNOSIS — E785 Hyperlipidemia, unspecified: Secondary | ICD-10-CM

## 2017-10-10 DIAGNOSIS — I209 Angina pectoris, unspecified: Secondary | ICD-10-CM

## 2017-10-10 NOTE — Telephone Encounter (Signed)
Spoke with pt and went over results and recommendations per Dr. Tamala Julian.  Pt verbalized understanding and was in agreement with this plan.  Scheduled pt to see Dr. Tamala Julian 11/25.  Referral placed for Lipid Clinic.

## 2017-10-10 NOTE — Telephone Encounter (Signed)
-----   Message from Belva Crome, MD sent at 10/10/2017  1:31 PM EDT ----- Let the patient know the calcium score is high risk.  She does have coronary artery disease.  We need to dramatically lower her cholesterol.  If she has any recurrence of chest pain we will need to perform coronary angiography.  Please refer her to the lipid clinic.  Encouraged her to use nitroglycerin if any recurrent pain.  Return to see me in 2 to 3 months or earlier if symptoms. A copy will be sent to Marin Olp, MD

## 2017-10-14 ENCOUNTER — Telehealth: Payer: Self-pay | Admitting: Interventional Cardiology

## 2017-10-14 NOTE — Telephone Encounter (Signed)
Spoke with pt and CT Scoring and Lipid Clinic.  Answered all of her questions.  Pt verbalized understanding and was appreciative for call.

## 2017-10-14 NOTE — Telephone Encounter (Signed)
New message      Called to schedule pt a lipid clinic appt.  She request to talk to the nurse.  Patient states that she has questions regarding her cholesterol and medications.  Please call.

## 2017-10-21 DIAGNOSIS — H26493 Other secondary cataract, bilateral: Secondary | ICD-10-CM | POA: Diagnosis not present

## 2017-10-30 ENCOUNTER — Ambulatory Visit: Payer: Medicare HMO

## 2017-10-31 ENCOUNTER — Ambulatory Visit: Payer: Medicare HMO

## 2017-11-04 ENCOUNTER — Ambulatory Visit (INDEPENDENT_AMBULATORY_CARE_PROVIDER_SITE_OTHER): Payer: Medicare HMO | Admitting: Pharmacist

## 2017-11-04 DIAGNOSIS — E785 Hyperlipidemia, unspecified: Secondary | ICD-10-CM

## 2017-11-04 NOTE — Progress Notes (Signed)
Patient ID: [name]                 DOB: [birth year]                    MRN: [id number]     HPI: [name] is a 77 y.o. female patient of Dr. Tamala Julian that presents today for lipid evaluation.  PMH includes obesity, diabetes mellitus, bariatric surgery, hyperlipidemia, and hypertension. She was recently evaluated for Angina. She also recently underwent coronary CT, which revealed coronary calcium of 990 (which is 23 percentile for age and sex matched control).   She presents today for discussion of cholesterol.  She has cramps on Saturday after taking medication on Friday. These are gone on Sunday afternoon. She states that this is currently tolerable to her and she is willing to continue 10mg  every Friday. She has previously tried Crestor 5mg  daily and she developed muscle cramps that were very severe after a few weeks of being on the medication. This improved after stopping the Crestor.  Risk Factors: CAD diagnosed with elevated calcium score LDL Goal: <70  Current Medications: atorvastatin 10mg  weekly, fish oil 1000mg  daily  Intolerances: atorvastatin 20mg  daily (cramps severe), Crestor 5mg  (cramps after building in system)   Diet: Most meal prepared from home. She does eat meat (chicken roasted, hamburger on grill). She does not like vegetables very much. She drinks mostly diet drinks and tea (sweeted with artificial sweetener) and water.   Exercise: She is limited due to knee pain.   Family History: brother undergoing CABG before age 80.  Mother had CVA.  Social History: former smoker  Labs: 10/22/16:  TC 185, TG 155, HDL 41, LDL 113 (atorvastatin 20mg  once weekly)  Past Medical History:  Diagnosis Date  . Anemia   . Arthritis    osteoarthritis - shoulder, knees & hips  . Asthma   . Depression   . Diabetes mellitus without complication (HCC)    no meds  . Hypertension    Dr. Orinda Kenner manages BP, pt. reports MD has not found a need for treatment   .  Hypothyroidism   . Sleep difficulties    had sleep study -2009, prior to gastric surgery, told that there was not a need for f/u  . Thyroid disease   . Vitamin B 12 deficiency     Current Outpatient Medications on File Prior to Visit  Medication Sig Dispense Refill  . acetaminophen (TYLENOL) 500 MG tablet Take 1,000 mg by mouth every 8 (eight) hours as needed for mild pain or headache.    . albuterol (PROVENTIL HFA;VENTOLIN HFA) 108 (90 Base) MCG/ACT inhaler Inhale 1-2 puffs into the lungs every 4 (four) hours as needed for wheezing or shortness of breath. 1 Inhaler 6  . Alcohol Swabs PADS Use to check blood sugar once a day 100 each 11  . amLODipine (NORVASC) 10 MG tablet Take 1 tablet (10 mg total) by mouth daily. 90 tablet 3  . aspirin 81 MG tablet Take 81 mg by mouth daily.    Marland Kitchen atorvastatin (LIPITOR) 20 MG tablet Take 1 tablet (20 mg total) by mouth once a week. 13 tablet 3  . Blood Glucose Calibration (TRUE METRIX LEVEL 2) Normal SOLN Use to calibrate meter as needed 1 each 0  . Cholecalciferol (VITAMIN D) 1000 UNITS capsule Take 5,000 Units by mouth daily.     . cyanocobalamin (,VITAMIN B-12,) 1000 MCG/ML injection Inject 1 mL (1,000 mcg total) into  the muscle every 30 (thirty) days. Uses on amonthly basis 10 mL 2  . desvenlafaxine (PRISTIQ) 100 MG 24 hr tablet Take 1 tablet (100 mg total) by mouth daily. 90 tablet 3  . diclofenac (VOLTAREN) 75 MG EC tablet TAKE 1 TABLET TWICE DAILY 180 tablet 0  . FeFum-FePo-FA-B Cmp-C-Zn-Mn-Cu (SE-TAN PLUS) 162-115.2-1 MG CAPS Take 1 capsule by mouth 2 (two) times daily. 180 capsule 3  . fluticasone (FLONASE) 50 MCG/ACT nasal spray USE 1 SPRAY IN EACH NOSTRIL EVERY DAY 32 g 3  . glucose blood test strip Test blood sugar once per day 100 each 3  . HYDROcodone-acetaminophen (NORCO/VICODIN) 5-325 MG tablet Take 1 tablet by mouth every 6 (six) hours as needed. 30 tablet 0  . Levothyroxine Sodium (TIROSINT) 150 MCG CAPS Take 2 capsules by mouth 2 out of 3  days; in morning on empty stomach for thyroid (name brand only DAW)    . loperamide (IMODIUM) 2 MG capsule Take 4 mg by mouth as needed for diarrhea or loose stools.    Marland Kitchen LORazepam (ATIVAN) 0.5 MG tablet Take 0.5-1 tablets (0.25-0.5 mg total) by mouth daily as needed for anxiety (do not drive for 8 hours after taking). 10 tablet 0  . montelukast (SINGULAIR) 10 MG tablet Take 1 tablet (10 mg total) by mouth daily with breakfast. 90 tablet 3  . nitroGLYCERIN (NITROSTAT) 0.4 MG SL tablet Place 1 tablet (0.4 mg total) under the tongue every 5 (five) minutes as needed for chest pain. 25 tablet 6  . Omega-3 Fatty Acids (FISH OIL) 1000 MG CAPS Take 1,000 mg by mouth daily.     Marland Kitchen Propylene Glycol (SYSTANE COMPLETE OP) Apply 1 drop to eye daily.    . TRUEPLUS LANCETS 30G MISC Check blood sugar once per day 100 each 3   No current facility-administered medications on file prior to visit.     Allergies  Allergen Reactions  . Flexeril [Cyclobenzaprine]     On Pristiq  With possible serotonin reaction to flexeril  . Ace Inhibitors     States after a surgery she was told this. Not sure what the reason was.   . Azithromycin     REACTION: Rash  . Codeine     REACTION: Upset stomach    Assessment/Plan: Hyperlipidemia: LDL is not at goal. Will continue atorvastatin 10mg  every Friday. Will send for coverage of PCSK9i therapy through insurance to bring LDL to goal. Discussed injection technique, risk/benefit, and cost obligations. Once approved will send to pharmacy for cost determination. If cost-prohibitive will send to [employer].    Thank you,  Lelan Pons. Patterson Hammersmith, [facility]  11/04/2017 9:08 AM

## 2017-11-04 NOTE — Patient Instructions (Signed)
We will send for coverage of Repatha 140mg  injection every 14 days. Once approved through insurance we will call you with the price. If cost -prohibitive we will send for coverage through the [employer].   Please call the clinic with any questions or concerns [phone].    Cholesterol Cholesterol is a fat. Your body needs a small amount of cholesterol. Cholesterol (plaque) may build up in your blood vessels (arteries). That makes you more likely to have a heart attack or stroke. You cannot feel your cholesterol level. Having a blood test is the only way to find out if your level is high. Keep your test results. Work with your doctor to keep your cholesterol at a good level. What do the results mean?  Total cholesterol is how much cholesterol is in your blood.  LDL is bad cholesterol. This is the type that can build up. Try to have low LDL.  HDL is good cholesterol. It cleans your blood vessels and carries LDL away. Try to have high HDL.  Triglycerides are fat that the body can store or burn for energy. What are good levels of cholesterol?  Total cholesterol below 200.  LDL below 100 is good for people who have health risks. LDL below 70 is good for people who have very high risks.  HDL above 40 is good. It is best to have HDL of 60 or higher.  Triglycerides below 150. How can I lower my cholesterol? Diet Follow your diet program as told by your doctor.  Choose fish, white meat chicken, or [place] that is roasted or baked. Try not to eat red meat, fried foods, sausage, or lunch meats.  Eat lots of fresh fruits and vegetables.  Choose whole grains, beans, pasta, potatoes, and cereals.  Choose olive oil, corn oil, or canola oil. Only use small amounts.  Try not to eat butter, mayonnaise, shortening, or palm kernel oils.  Try not to eat foods with trans fats.  Choose low-fat or nonfat dairy foods. ? Drink skim or nonfat milk. ? Eat low-fat or nonfat yogurt  and cheeses. ? Try not to drink whole milk or cream. ? Try not to eat ice cream, egg yolks, or full-fat cheeses.  Healthy desserts include angel food cake, ginger snaps, animal crackers, hard candy, popsicles, and low-fat or nonfat frozen yogurt. Try not to eat pastries, cakes, pies, and cookies.  Exercise Follow your exercise program as told by your doctor.  Be more active. Try gardening, walking, and taking the stairs.  Ask your doctor about ways that you can be more active.  Medicine  Take over-the-counter and prescription medicines only as told by your doctor. This information is not intended to replace advice given to you by your health care provider. Make sure you discuss any questions you have with your health care provider. Document Released: 04/05/2008 Document Revised: 08/09/2015 Document Reviewed: 07/20/2015 Elsevier Interactive Patient Education  [employer].

## 2017-11-05 ENCOUNTER — Telehealth: Payer: Self-pay | Admitting: Pharmacist

## 2017-11-05 MED ORDER — EVOLOCUMAB 140 MG/ML ~~LOC~~ SOAJ: 1.0000 "pen " | SUBCUTANEOUS | 11 refills | Status: DC

## 2017-11-05 NOTE — Telephone Encounter (Signed)
Called pharmacy to follow up with copay. Cost prohibitive at $477. Pt is aware. Will fax Safety Net Application.

## 2017-11-05 NOTE — Telephone Encounter (Signed)
Repatha prior authorization approved. Rx sent to pharmacy to determine copay.

## 2017-11-13 DIAGNOSIS — H26492 Other secondary cataract, left eye: Secondary | ICD-10-CM | POA: Diagnosis not present

## 2017-11-19 ENCOUNTER — Encounter: Payer: Self-pay | Admitting: Family Medicine

## 2017-11-19 ENCOUNTER — Ambulatory Visit (INDEPENDENT_AMBULATORY_CARE_PROVIDER_SITE_OTHER): Payer: Medicare HMO | Admitting: Family Medicine

## 2017-11-19 VITALS — BP 132/76 | HR 66 | Temp 98.2°F | Ht 61.0 in | Wt 213.4 lb

## 2017-11-19 DIAGNOSIS — E538 Deficiency of other specified B group vitamins: Secondary | ICD-10-CM | POA: Diagnosis not present

## 2017-11-19 DIAGNOSIS — E119 Type 2 diabetes mellitus without complications: Secondary | ICD-10-CM

## 2017-11-19 DIAGNOSIS — M159 Polyosteoarthritis, unspecified: Secondary | ICD-10-CM

## 2017-11-19 DIAGNOSIS — Z0000 Encounter for general adult medical examination without abnormal findings: Secondary | ICD-10-CM | POA: Diagnosis not present

## 2017-11-19 DIAGNOSIS — F3342 Major depressive disorder, recurrent, in full remission: Secondary | ICD-10-CM | POA: Diagnosis not present

## 2017-11-19 DIAGNOSIS — Z23 Encounter for immunization: Secondary | ICD-10-CM | POA: Diagnosis not present

## 2017-11-19 DIAGNOSIS — I2510 Atherosclerotic heart disease of native coronary artery without angina pectoris: Secondary | ICD-10-CM | POA: Insufficient documentation

## 2017-11-19 DIAGNOSIS — Z6841 Body Mass Index (BMI) 40.0 and over, adult: Secondary | ICD-10-CM | POA: Diagnosis not present

## 2017-11-19 LAB — COMPREHENSIVE METABOLIC PANEL
ALK PHOS: 108 U/L (ref 39–117)
ALT: 17 U/L (ref 0–35)
AST: 21 U/L (ref 0–37)
Albumin: 3.9 g/dL (ref 3.5–5.2)
BUN: 16 mg/dL (ref 6–23)
CHLORIDE: 102 meq/L (ref 96–112)
CO2: 27 mEq/L (ref 19–32)
Calcium: 10.6 mg/dL — ABNORMAL HIGH (ref 8.4–10.5)
Creatinine, Ser: 0.89 mg/dL (ref 0.40–1.20)
GFR: 65.25 mL/min (ref >60.00)
GLUCOSE: 127 mg/dL — AB (ref 70–99)
POTASSIUM: 4 meq/L (ref 3.5–5.1)
Sodium: 137 mEq/L (ref 135–145)
TOTAL PROTEIN: 6.8 g/dL (ref 6.0–8.3)
Total Bilirubin: 0.4 mg/dL (ref 0.2–1.2)

## 2017-11-19 LAB — LIPID PANEL
Cholesterol: 136 mg/dL (ref 0–200)
HDL: 34.7 mg/dL — AB (ref >39.00)
LDL CALC: 73 mg/dL (ref 0–99)
NonHDL: 101.1
Total CHOL/HDL Ratio: 4
Triglycerides: 141 mg/dL (ref 0.0–149.0)
VLDL: 28.2 mg/dL (ref 0.0–40.0)

## 2017-11-19 LAB — CBC
HEMATOCRIT: 42.3 % (ref 36.0–46.0)
Hemoglobin: 14 g/dL (ref 12.0–15.0)
MCHC: 33.1 g/dL (ref 30.0–36.0)
MCV: 88.9 fl (ref 78.0–100.0)
PLATELETS: 218 10*3/uL (ref 150.0–400.0)
RBC: 4.75 Mil/uL (ref 3.87–5.11)
RDW: 13.9 % (ref 11.5–15.5)
WBC: 7.2 10*3/uL (ref 4.0–10.5)

## 2017-11-19 LAB — VITAMIN B12: Vitamin B-12: 1285 pg/mL — ABNORMAL HIGH (ref 211–911)

## 2017-11-19 LAB — HEMOGLOBIN A1C: Hgb A1c MFr Bld: 6.6 % — ABNORMAL HIGH (ref 4.6–6.5)

## 2017-11-19 NOTE — Progress Notes (Signed)
Phone: [phone]  Subjective:  Patient presents today for their annual physical. Chief complaint-noted.   See problem oriented charting- ROS- full  review of systems was completed and negative except for: eye discharge and itching, light sensitivity, visual problems, pelvic panis at times, joint pain, back pain, gait problems, muscle aches, neck stiffness, decreased concentration  The following were reviewed and entered/updated in epic: Past Medical History:  Diagnosis Date  . Anemia   . Arthritis    osteoarthritis - shoulder, knees & hips  . Asthma   . Depression   . Diabetes mellitus without complication (HCC)    no meds  . Hypertension    Dr. Orinda Kenner manages BP, pt. reports MD has not found a need for treatment   . Hypothyroidism   . Sleep difficulties    had sleep study -2009, prior to gastric surgery, told that there was not a need for f/u  . Thyroid disease   . Vitamin B 12 deficiency    Patient Active Problem List   Diagnosis Date Noted  . CAD (coronary artery disease) 11/19/2017    Priority: High  . Hypercalcemia 12/29/2014    Priority: High  . Well controlled type 2 diabetes mellitus ([place]) 07/18/2006    Priority: High  . Overactive bladder 09/03/2016    Priority: Medium  . Morbid obesity ([place]) 06/28/2015    Priority: Medium  . History of gastric bypass 02/08/2014    Priority: Medium  . Osteoarthritis 10/05/2013    Priority: Medium  . Hyperlipidemia 01/05/2008    Priority: Medium  . Hypothyroidism 09/09/2007    Priority: Medium  . Major depression, recurrent, full remission ([place]) 07/18/2006    Priority: Medium  . Essential hypertension 07/18/2006    Priority: Medium  . ASTHMA 07/18/2006    Priority: Medium  . Allergic rhinitis 07/20/2014    Priority: Low  . CHEST WALL PAIN, ANTERIOR 10/09/2007    Priority: Low  . Anxiety 07/17/2017  . Osteopenia of left hip 07/17/2017   Past Surgical History:  Procedure Laterality Date  . ABDOMINAL  HYSTERECTOMY    . BARIATRIC SURGERY    . CARDIAC CATHETERIZATION     [facility]- 30 yrs. ago  . CATARACT EXTRACTION, BILATERAL     late 2018  . OOPHORECTOMY    . pantallor arthrodesis with rod placement left foot    . TONSILLECTOMY    . TOTAL SHOULDER ARTHROPLASTY Left 03/12/2012   Procedure: TOTAL SHOULDER ARTHROPLASTY;  Surgeon: Marin Shutter, MD;  Location: [facility];  Service: Orthopedics;  Laterality: Left;    Family History  Problem Relation Age of Onset  . Stroke Mother   . Liver cancer Father   . Cancer Father        liver  . Breast cancer Neg Hx     Medications- reviewed and updated Current Outpatient Medications  Medication Sig Dispense Refill  . acetaminophen (TYLENOL) 500 MG tablet Take 1,000 mg by mouth every 8 (eight) hours as needed for mild pain or headache.    . albuterol (PROVENTIL HFA;VENTOLIN HFA) 108 (90 Base) MCG/ACT inhaler Inhale 1-2 puffs into the lungs every 4 (four) hours as needed for wheezing or shortness of breath. 1 Inhaler 6  . Alcohol Swabs PADS Use to check blood sugar once a day 100 each 11  . amLODipine (NORVASC) 10 MG tablet Take 1 tablet (10 mg total) by mouth daily. 90 tablet 3  . aspirin 81 MG tablet Take 81 mg by mouth daily.    Marland Kitchen  atorvastatin (LIPITOR) 20 MG tablet Take 1 tablet (20 mg total) by mouth once a week. (Patient taking differently: Take 10 mg by mouth once a week. ) 13 tablet 3  . Blood Glucose Calibration (TRUE METRIX LEVEL 2) Normal SOLN Use to calibrate meter as needed 1 each 0  . Cholecalciferol (VITAMIN D) 1000 UNITS capsule Take 5,000 Units by mouth daily.     . cyanocobalamin (,VITAMIN B-12,) 1000 MCG/ML injection Inject 1 mL (1,000 mcg total) into the muscle every 30 (thirty) days. Uses on amonthly basis 10 mL 2  . desvenlafaxine (PRISTIQ) 100 MG 24 hr tablet Take 1 tablet (100 mg total) by mouth daily. 90 tablet 3  . diclofenac (VOLTAREN) 75 MG EC tablet TAKE 1 TABLET TWICE DAILY 180 tablet 0  . FeFum-FePo-FA-B Cmp-C-Zn-Mn-Cu  (SE-TAN PLUS) 162-115.2-1 MG CAPS Take 1 capsule by mouth 2 (two) times daily. 180 capsule 3  . fluticasone (FLONASE) 50 MCG/ACT nasal spray USE 1 SPRAY IN EACH NOSTRIL EVERY DAY 32 g 3  . glucose blood test strip Test blood sugar once per day 100 each 3  . HYDROcodone-acetaminophen (NORCO/VICODIN) 5-325 MG tablet Take 1 tablet by mouth every 6 (six) hours as needed. 30 tablet 0  . Levothyroxine Sodium (TIROSINT) 150 MCG CAPS Take 2 capsules by mouth 2 out of 3 days; in morning on empty stomach for thyroid (name brand only DAW)    . loperamide (IMODIUM) 2 MG capsule Take 4 mg by mouth as needed for diarrhea or loose stools.    Marland Kitchen LORazepam (ATIVAN) 0.5 MG tablet Take 0.5-1 tablets (0.25-0.5 mg total) by mouth daily as needed for anxiety (do not drive for 8 hours after taking). 10 tablet 0  . montelukast (SINGULAIR) 10 MG tablet Take 1 tablet (10 mg total) by mouth daily with breakfast. 90 tablet 3  . nitroGLYCERIN (NITROSTAT) 0.4 MG SL tablet Place 1 tablet (0.4 mg total) under the tongue every 5 (five) minutes as needed for chest pain. 25 tablet 6  . Omega-3 Fatty Acids (FISH OIL) 1000 MG CAPS Take 1,000 mg by mouth daily.     Marland Kitchen Propylene Glycol (SYSTANE COMPLETE OP) Apply 1 drop to eye daily.    . TRUEPLUS LANCETS 30G MISC Check blood sugar once per day 100 each 3  . Evolocumab (REPATHA SURECLICK) 941 MG/ML SOAJ Inject 1 pen into the skin every 14 (fourteen) days. (Patient not taking: Reported on 11/19/2017) 2 pen 11   No current facility-administered medications for this visit.     Allergies-reviewed and updated Allergies  Allergen Reactions  . Flexeril [Cyclobenzaprine]     On Pristiq  With possible serotonin reaction to flexeril  . Ace Inhibitors     States after a surgery she was told this. Not sure what the reason was.   . Azithromycin     REACTION: Rash  . Codeine     REACTION: Upset stomach    Social History   Social History Narrative   Widowed 2013. 2 kids. 4 grandkids.  Oldest daughter lives in [place] and has 2 grandkids that are now in Scientist, physiological, PT school at [employer].       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 Oropharynx normal Neck: no thyromegaly CV: RRR no murmurs rubs or gallops Lungs: CTAB no crackles, wheeze, rhonchi Abdomen: soft/nontender/nondistended/normal bowel sounds. No rebound or guarding.  Ext: no edema Skin: warm, dry Neuro: grossly normal, moves all extremities, PERRLA  Assessment/Plan:  77 y.o. female presenting for annual physical.  Health Maintenance counseling: 1. Anticipatory guidance: Patient counseled regarding regular dental exams -daughter is her dentist and sees-q6 months, eye exams - yearly, wearing seatbelts.  2. Risk factor reduction:  Advised patient of need for regular exercise and diet rich and fruits and vegetables to reduce risk of heart attack and stroke. Exercise-had been doing tai chi previously-hasnt started back- encouraged her. Diet-had setback with weight loss progress due to knee issues- she has to get BMI under 40 to get knee replacement and she has been working hard on this.  Current BMI and morbid obesity range.  Doing weight watchers-has done phenomenally well-see 18 pounds weight loss will Wt Readings from Last 3 Encounters:  11/19/17 213 lb 6.4 oz (96.8 kg)  09/29/17 231 lb 6.4 oz (105 kg)  09/04/17 232 lb 9.6 oz (105.5 kg)  3. Immunizations/screenings/ancillary studies-flu shot advised today and given  Immunization History  Administered Date(s) Administered  . Influenza Split 10/22/2010, 11/14/2011  . Influenza Whole  11/13/2006, 10/27/2008  . Influenza, High Dose Seasonal PF 10/28/2014, 09/28/2015, 10/18/2016  . Influenza,inj,Quad PF,6+ Mos 10/05/2012, 10/05/2013  . Influenza-Unspecified 10/21/2013  . Pneumococcal Conjugate-13 03/01/2013, 10/21/2013  . Pneumococcal Polysaccharide-23 05/31/2006  . Td 11/24/1998, 03/01/2013  . Zoster 06/20/2006  . Zoster Recombinat (Shingrix) 10/31/2016, 05/01/2017  4. Cervical cancer screening- she is past age based screening.  No history of abnormal Pap smear 5. Breast cancer screening-she is past age based screening-typically declines breast exam.  Advised one final mammogram last year which was normal on 11/06/2016 6. Colon cancer screening - 10/19/2014 and was advised this was her last screening by GI 7. Skin cancer screening-no dermatologist. advised regular sunscreen use. Denies worrisome, changing, or new skin lesions.  8.  Osteoporosis screening at 65-known osteopenia and followed by Dr. Elyse Hsu  Status of chronic or acute concerns   Hypertension-controlled on amlodipine 10 mg  Osteopenia-followed by Dr. Elyse Hsu (worse on last check- sees him in November to go over).  He also follows her hypercalcemia and hypothyroidism  Depression-has been controlled on Pristiq 100 mg. updated full PHQ9- looks good with score of 4 today   B12 deficiency-remains on B12 injections at home. hasnt taken yet this month though   CAD (coronary artery disease) Coronary artery disease based on coronary CT done by Dr. Rich Brave follow-up in November.  She is on atorvastatin 20 mg once a week.  She has seen the lipid clinic and they are trying to get her on PCSK9 inhibitor therapy-they are forwarding to safety net application as not cost effective for her to take Repatha at nearly $500 a month.  Well controlled type 2 diabetes mellitus ([place]) Diabetes-has been diet controlled.  Update A1c -Needs updated diabetic eye exam on file- to sign ROI   Osteoarthritis Osteoarthritis knees-  had wanted to avoid surgery-we have used a small volume of hydrocodone No. 30 with hopes this lasts at least 6 months.  Has had some injections with Dr. Elmyra Ricks.  diclofenac not ideal with known cardiac issues-we discussed risks- she fortunately has cut  to once a day.  She is also on Ativan for anxiety-also very sparing-knows to separate from hydrocodone by 8 hours -UDS 06/04/2017 - has changed mind and is now considering surgery and try to get BMI below 40 so she can have this done.  Would need cardiac clearance from Dr. Tamala Julian with cardiology  Future Appointments  Date Time Provider [place]  12/15/2017  9:00 AM Belva Crome, MD [facility]   4-6 months follow up  Lab/Order associations: Fasting  Preventative health care - Plan: CBC, Comprehensive metabolic panel, Lipid panel, Hemoglobin A1c, Vitamin B12  Well controlled type 2 diabetes mellitus ([place]) - Plan: CBC, Comprehensive metabolic panel, Lipid panel, Hemoglobin A1c  B12 deficiency - Plan: Vitamin B12  Coronary artery disease involving native coronary artery of native heart without angina pectoris  Osteoarthritis of multiple joints, unspecified osteoarthritis type  Major depression, recurrent, full remission ([place])  Morbid obesity ([place])  Body mass index (BMI) of 40.1-44.9 in adult [facility])  Return precautions advised.  Garret Reddish, MD

## 2017-11-19 NOTE — Patient Instructions (Addendum)
Health Maintenance Due  Topic Date Due  . INFLUENZA VACCINE - Thanks for doing flu shot today 08/21/2017  . OPHTHALMOLOGY EXAM - Sign release of information at the check out desk for eye doctor report 10/30/2017   Please stop by lab before you go   I would also like for you to sign up for an annual wellness visit with one of our nurse specialists, Cassie or Manuela Schwartz or [place]. This is a free benefit under medicare that may help [place] find additional ways to help you. Some highlights are reviewing medications, lifestyle, and doing a dementia screen.   4-6 months follow up

## 2017-11-19 NOTE — Assessment & Plan Note (Signed)
Diabetes-has been diet controlled.  Update A1c -Needs updated diabetic eye exam on file- to sign ROI

## 2017-11-19 NOTE — Addendum Note (Signed)
Addended by: Kayren Eaves T on: 11/19/2017 09:14 AM   Modules accepted: Orders

## 2017-11-19 NOTE — Addendum Note (Signed)
Addended by: Lyndle Herrlich on: 11/19/2017 09:15 AM   Modules accepted: Orders

## 2017-11-19 NOTE — Assessment & Plan Note (Signed)
Osteoarthritis knees- had wanted to avoid surgery-we have used a small volume of hydrocodone No. 30 with hopes this lasts at least 6 months.  Has had some injections with Dr. Elmyra Ricks.  diclofenac not ideal with known cardiac issues-we discussed risks- she fortunately has cut to once a day.  She is also on Ativan for anxiety-also very sparing-knows to separate from hydrocodone by 8 hours -UDS 06/04/2017 - has changed mind and is now considering surgery and try to get BMI below 40 so she can have this done.  Would need cardiac clearance from Dr. Tamala Julian with cardiology

## 2017-11-19 NOTE — Assessment & Plan Note (Signed)
Coronary artery disease based on coronary CT done by Dr. Rich Brave follow-up in November.  She is on atorvastatin 20 mg once a week.  She has seen the lipid clinic and they are trying to get her on PCSK9 inhibitor therapy-they are forwarding to safety net application as not cost effective for her to take Repatha at nearly $500 a month.

## 2017-11-27 DIAGNOSIS — H26491 Other secondary cataract, right eye: Secondary | ICD-10-CM | POA: Diagnosis not present

## 2017-12-03 NOTE — Progress Notes (Signed)
Subjective:   [name] is a 77 y.o. female who presents for Medicare Annual (Subsequent) preventive examination.  Reports health as fair Bone on bone in both knees Plaque in her heart; In June was in ER for chest pain Made an apt with cardiology  Heart scan - heart scan with calcium deposits  Repatha and they are checking on financial assistance  Former RN   Diet BMI 39.7 ( has lost 21 lbs)  She has to lose weight prior to the surgery  Has to be cleared by the surgery  Chol/hdl 4 A1c 6.6  Weight watchers has 3 different plans she can go on  Breakfast egg, piece of toast; cereal (oatmeal) fruit Pancakes that have protein Lunch chicken; Green beans  Supper 2 batches of [employer] soup  One with ground chuck and one with [place]   Exercise Very limited with knees Just purchased exercise she can do with feet and arms  Also does tai chi   Eye exam at [facility] Dr. Ellie Lunch Request sent for eye exam  No diabetic retinopathy    Health Maintenance Due  Topic Date Due  . OPHTHALMOLOGY EXAM  10/30/2017   States she has completed request to have Dr. Ellie Lunch send over her eye exam report States she has no retinopathy  She is going soon and will check on this   Mammogram 10/2016 - is due dexa 07/2017  -3.2 in wrist - has apt with endo next month to discuss         Objective:     Vitals: BP 130/70   Pulse 71   Ht _0  (1.549 m)   Wt 210 lb (95.3 kg)   SpO2 93%   BMI 39.68 kg/m   Body mass index is 39.68 kg/m.  Advanced Directives 12/04/2017 05/08/2016 05/02/2016 03/04/2012  Does Patient Have a Medical Advance Directive? Yes Yes Yes Patient has advance directive, copy not in chart  Type of Advance Directive - [place];Living will - Living will  Does patient want to make changes to medical advance directive? - No - Patient declined - -  Copy of [place] in Chart? - No - copy requested - -   Yes she had  completed-  Tobacco Social History   Tobacco Use  Smoking Status Former Smoker  . Packs/day: 0.70  . Years: 20.00  . Pack years: 14.00  . Last attempt to quit: 03/04/1978  . Years since quitting: 39.7  Smokeless Tobacco Never Used     Counseling given: Yes   Clinical Intake:       Past Medical History:  Diagnosis Date  . Anemia   . Arthritis    osteoarthritis - shoulder, knees & hips  . Asthma   . Depression   . Diabetes mellitus without complication (HCC)    no meds  . Hypertension    Dr. Orinda Kenner manages BP, pt. reports MD has not found a need for treatment   . Hypothyroidism   . Sleep difficulties    had sleep study -2009, prior to gastric surgery, told that there was not a need for f/u  . Thyroid disease   . Vitamin B 12 deficiency    Past Surgical History:  Procedure Laterality Date  . ABDOMINAL HYSTERECTOMY    . BARIATRIC SURGERY    . CARDIAC CATHETERIZATION     [facility]- 30 yrs. ago  . CATARACT EXTRACTION, BILATERAL     late 2018  . OOPHORECTOMY    .  pantallor arthrodesis with rod placement left foot    . TONSILLECTOMY    . TOTAL SHOULDER ARTHROPLASTY Left 03/12/2012   Procedure: TOTAL SHOULDER ARTHROPLASTY;  Surgeon: Marin Shutter, MD;  Location: [facility];  Service: Orthopedics;  Laterality: Left;   Family History  Problem Relation Age of Onset  . Stroke Mother   . Liver cancer Father   . Cancer Father        liver  . Breast cancer Neg Hx    Social History   Socioeconomic History  . Marital status: Widowed    Spouse name: Not on file  . Number of children: Not on file  . Years of education: Not on file  . Highest education level: Not on file  Occupational History  . Occupation: retired Optician, dispensing: RETIRED  Social Needs  . Financial resource strain: Not on file  . Food insecurity:    Worry: Not on file    Inability: Not on file  . Transportation needs:    Medical: Not on file    Non-medical: Not on file  Tobacco Use  . Smoking  status: Former Smoker    Packs/day: 0.70    Years: 20.00    Pack years: 14.00    Last attempt to quit: 03/04/1978    Years since quitting: 39.7  . Smokeless tobacco: Never Used  Substance and Sexual Activity  . Alcohol use: Yes    Alcohol/week: 2.0 standard drinks    Types: 2 Glasses of wine per week    Comment: with dinner  or social rare -   . Drug use: No  . Sexual activity: Not Currently  Lifestyle  . Physical activity:    Days per week: Not on file    Minutes per session: Not on file  . Stress: Not on file  Relationships  . Social connections:    Talks on phone: Not on file    Gets together: Not on file    Attends religious service: Not on file    Active member of club or organization: Not on file    Attends meetings of clubs or organizations: Not on file    Relationship status: Not on file  Other Topics Concern  . Not on file  Social History Narrative   Widowed 2013. 2 kids. 4 grandkids. Oldest daughter lives in [place] and has 2 grandkids that are now in Scientist, physiological, PT school at [employer].       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 acetaminophen (TYLENOL) 500 MG tablet Take 1,000 mg by mouth every 8 (eight) hours as needed for mild pain or headache.  . albuterol (PROVENTIL HFA;VENTOLIN HFA) 108 (90 Base) MCG/ACT inhaler Inhale 1-2 puffs into the lungs every 4 (four) hours as needed for wheezing or shortness of breath.  . Alcohol Swabs PADS Use to check blood sugar once a day  . amLODipine (NORVASC) 10 MG tablet Take 1 tablet (10 mg total) by mouth daily.  Marland Kitchen aspirin 81 MG tablet Take 81 mg by mouth daily.  Marland Kitchen atorvastatin (LIPITOR) 20 MG tablet Take 1 tablet (20 mg total) by mouth once a week. (Patient taking differently: Take 10 mg by mouth once a week. )  . Blood Glucose Calibration (TRUE METRIX  LEVEL 2) Normal SOLN Use to calibrate  meter as needed  . Cholecalciferol (VITAMIN D) 1000 UNITS capsule Take 5,000 Units by mouth daily.   . cyanocobalamin (,VITAMIN B-12,) 1000 MCG/ML injection Inject 1 mL (1,000 mcg total) into the muscle every 30 (thirty) days. Uses on amonthly basis  . desvenlafaxine (PRISTIQ) 100 MG 24 hr tablet Take 1 tablet (100 mg total) by mouth daily.  . diclofenac (VOLTAREN) 75 MG EC tablet TAKE 1 TABLET TWICE DAILY  . Evolocumab (REPATHA SURECLICK) 923 MG/ML SOAJ Inject 1 pen into the skin every 14 (fourteen) days.  . FeFum-FePo-FA-B Cmp-C-Zn-Mn-Cu (SE-TAN PLUS) 162-115.2-1 MG CAPS Take 1 capsule by mouth 2 (two) times daily.  . fluticasone (FLONASE) 50 MCG/ACT nasal spray USE 1 SPRAY IN EACH NOSTRIL EVERY DAY  . glucose blood test strip Test blood sugar once per day  . HYDROcodone-acetaminophen (NORCO/VICODIN) 5-325 MG tablet Take 1 tablet by mouth every 6 (six) hours as needed.  . Levothyroxine Sodium (TIROSINT) 150 MCG CAPS Take 2 capsules by mouth 2 out of 3 days; in morning on empty stomach for thyroid (name brand only DAW)  . loperamide (IMODIUM) 2 MG capsule Take 4 mg by mouth as needed for diarrhea or loose stools.  Marland Kitchen LORazepam (ATIVAN) 0.5 MG tablet Take 0.5-1 tablets (0.25-0.5 mg total) by mouth daily as needed for anxiety (do not drive for 8 hours after taking).  . montelukast (SINGULAIR) 10 MG tablet Take 1 tablet (10 mg total) by mouth daily with breakfast.  . nitroGLYCERIN (NITROSTAT) 0.4 MG SL tablet Place 1 tablet (0.4 mg total) under the tongue every 5 (five) minutes as needed for chest pain.  . Omega-3 Fatty Acids (FISH OIL) 1000 MG CAPS Take 1,000 mg by mouth daily.   Marland Kitchen Propylene Glycol (SYSTANE COMPLETE OP) Apply 1 drop to eye daily.  . TRUEPLUS LANCETS 30G MISC Check blood sugar once per day   No facility-administered encounter medications on file as of 12/04/2017.     Activities of Daily Living In your present state of health, do you have any  difficulty performing the following activities: 12/04/2017  Hearing? N  Vision? N  Difficulty concentrating or making decisions? N  Walking or climbing stairs? Y  Comment OA of knees  Dressing or bathing? N  Doing errands, shopping? N  Preparing Food and eating ? N  Using the Toilet? N  In the past six months, have you accidently leaked urine? N  Comment goes to [facility]  Do you have problems with loss of bowel control? N  Managing your Medications? N  Managing your Finances? N  Housekeeping or managing your Housekeeping? N  Some recent data might be hidden      Patient Care Team: Marin Olp, MD as PCP - General (Family Medicine) Belva Crome, MD as PCP - Cardiology (Cardiology) Clarene Essex, MD as Consulting Physician (Gastroenterology) Altheimer, Legrand Como, MD as Referring Physician (Endocrinology) Luberta Mutter, MD as Consulting Physician (Ophthalmology) Gaynelle Arabian, MD as Consulting Physician (Orthopedic Surgery) Johnathan Hausen, MD as Consulting Physician (General Surgery) Raynelle Bring, MD as Consulting Physician (Urology) Rozetta Nunnery, MD as Consulting Physician (Otolaryngology) Dtr is her dentist ; Dr. Doristine Church     Assessment:   This is a routine wellness examination for Angie.  Exercise Activities and Dietary recommendations    Goals    . Exercise 150 minutes per week (moderate activity)     Will explore tai chi or yoga       Fall Risk Fall Risk  12/04/2017 09/04/2017 09/03/2016 05/02/2016  06/28/2015  Falls in the past year? 0 Yes No No Yes  Number falls in past yr: - 2 or more - - 1  Injury with Fall? - Yes - - Yes  Risk Factor Category  - High Fall Risk - - -  Risk for fall due to : - Impaired balance/gait - - -  Follow up - - - - Education provided     Depression Screen PHQ 2/9 Scores 12/04/2017 11/19/2017 09/04/2017 02/05/2017  PHQ - 2 Score 0 1 0 0  PHQ- 9 Score - 4 - 6     Cognitive Function MMSE - Mini Mental State Exam  05/02/2016  Not completed: (No Data)   No memory issues noted in the assessment  Youngest dtr works in [place] unit and does not feel she had memory issues       Immunization History  Administered Date(s) Administered  . Influenza Split 10/22/2010, 11/14/2011  . Influenza Whole 11/13/2006, 10/27/2008  . Influenza, High Dose Seasonal PF 10/28/2014, 09/28/2015, 10/18/2016, 11/19/2017  . Influenza,inj,Quad PF,6+ Mos 10/05/2012, 10/05/2013  . Influenza-Unspecified 10/21/2013  . Pneumococcal Conjugate-13 03/01/2013, 10/21/2013  . Pneumococcal Polysaccharide-23 05/31/2006  . Td 11/24/1998, 03/01/2013  . Zoster 06/20/2006  . Zoster Recombinat (Shingrix) 10/31/2016, 05/01/2017    Screening Tests Health Maintenance  Topic Date Due  . OPHTHALMOLOGY EXAM  10/30/2017  . FOOT EXAM  02/05/2018  . URINE MICROALBUMIN  02/05/2018  . HEMOGLOBIN A1C  05/21/2018  . TETANUS/TDAP  03/02/2023  . INFLUENZA VACCINE  Completed  . DEXA SCAN  Completed  . PNA vac Low Risk Adult  Completed        Plan:      PCP Notes   Health Maintenance Had had shingrix   States she has completed request to have Dr. Ellie Lunch send over her eye exam report States she has no retinopathy  She is going soon and will check on this   Mammogram 10/2016 - is due- will fup with [facility] to call her dexa 07/2017  -3.2 in wrist - has apt with endo next month to discuss  Abnormal Screens  None   Referrals  none  Patient concerns; Discussed recent findings of calcium in the blood and heart which puts her at risk for HD. Dr. Thompson Caul office working on getting her [place]. Copays are to expensive.  She is losing weight; BMI 39.7 and was 42  She is going to weight watchers. Is trying to get her chol down and losing weight prior to having knee replacement  Dr. Tamala Julian has to clear her   Nurse Concerns; As noted  Next PCP apt OV 11/09/2017       I have personally reviewed and noted the following in the  patient's chart:   . Medical and social history . Use of alcohol, tobacco or illicit drugs  . Current medications and supplements . Functional ability and status . Nutritional status . Physical activity . Advanced directives . List of other physicians . Hospitalizations, surgeries, and ER visits in previous 12 months . Vitals . Screenings to include cognitive, depression, and falls . Referrals and appointments  In addition, I have reviewed and discussed with patient certain preventive protocols, quality metrics, and best practice recommendations. A written personalized care plan for preventive services as well as general preventive health recommendations were provided to patient.     Wynetta Fines, RN  12/04/2017

## 2017-12-04 ENCOUNTER — Ambulatory Visit (INDEPENDENT_AMBULATORY_CARE_PROVIDER_SITE_OTHER): Payer: Medicare HMO

## 2017-12-04 VITALS — BP 130/70 | HR 71 | Ht 61.0 in | Wt 210.0 lb

## 2017-12-04 DIAGNOSIS — Z0000 Encounter for general adult medical examination without abnormal findings: Secondary | ICD-10-CM | POA: Diagnosis not present

## 2017-12-04 NOTE — Patient Instructions (Addendum)
[name] , Thank you for taking time to come for your Medicare Wellness Visit. I appreciate your ongoing commitment to your health goals. Please review the following plan we discussed and let me know if I can assist you in the future.   Repatha; [email or web address]  Stay on your goal to lose weight and continue in weight watchers     These are the goals we discussed: Goals    . Exercise 150 minutes per week (moderate activity)     Will explore tai chi or yoga       This is a list of the screening recommended for you and due dates:  Health Maintenance  Topic Date Due  . Eye exam for diabetics  10/30/2017  . Complete foot exam   02/05/2018  . Urine Protein Check  02/05/2018  . Hemoglobin A1C  05/21/2018  . Tetanus Vaccine  03/02/2023  . Flu Shot  Completed  . DEXA scan (bone density measurement)  Completed  . Pneumonia vaccines  Completed      Fall Prevention in the Home Falls can cause injuries. They can happen to people of all ages. There are many things you can do to make your home safe and to help prevent falls. What can I do on the outside of my home?  Regularly fix the edges of walkways and driveways and fix any cracks.  Remove anything that might make you trip as you walk through a door, such as a raised step or threshold.  Trim any bushes or trees on the path to your home.  Use bright outdoor lighting.  Clear any walking paths of anything that might make someone trip, such as rocks or tools.  Regularly check to see if handrails are loose or broken. Make sure that both sides of any steps have handrails.  Any raised decks and porches should have guardrails on the edges.  Have any leaves, snow, or ice cleared regularly.  Use sand or salt on walking paths during winter.  Clean up any spills in your garage right away. This includes oil or grease spills. What can I do in the bathroom?  Use night lights.  Install grab bars by  the toilet and in the tub and shower. Do not use towel bars as grab bars.  Use non-skid mats or decals in the tub or shower.  If you need to sit down in the shower, use a plastic, non-slip stool.  Keep the floor dry. Clean up any water that spills on the floor as soon as it happens.  Remove soap buildup in the tub or shower regularly.  Attach bath mats securely with double-sided non-slip rug tape.  Do not have throw rugs and other things on the floor that can make you trip. What can I do in the bedroom?  Use night lights.  Make sure that you have a light by your bed that is easy to reach.  Do not use any sheets or blankets that are too big for your bed. They should not hang down onto the floor.  Have a firm chair that has side arms. You can use this for support while you get dressed.  Do not have throw rugs and other things on the floor that can make you trip. What can I do in the kitchen?  Clean up any spills right away.  Avoid walking on wet floors.  Keep items that you use a lot in easy-to-reach places.  If you need to reach something above you,  use a strong step stool that has a grab bar.  Keep electrical cords out of the way.  Do not use floor polish or wax that makes floors slippery. If you must use wax, use non-skid floor wax.  Do not have throw rugs and other things on the floor that can make you trip. What can I do with my stairs?  Do not leave any items on the stairs.  Make sure that there are handrails on both sides of the stairs and use them. Fix handrails that are broken or loose. Make sure that handrails are as long as the stairways.  Check any carpeting to make sure that it is firmly attached to the stairs. Fix any carpet that is loose or worn.  Avoid having throw rugs at the top or bottom of the stairs. If you do have throw rugs, attach them to the floor with carpet tape.  Make sure that you have a light switch at the top of the stairs and the bottom  of the stairs. If you do not have them, ask someone to add them for you. What else can I do to help prevent falls?  Wear shoes that: ? Do not have high heels. ? Have rubber bottoms. ? Are comfortable and fit you well. ? Are closed at the toe. Do not wear sandals.  If you use a stepladder: ? Make sure that it is fully opened. Do not climb a closed stepladder. ? Make sure that both sides of the stepladder are locked into place. ? Ask someone to hold it for you, if possible.  Clearly mark and make sure that you can see: ? Any grab bars or handrails. ? First and last steps. ? Where the edge of each step is.  Use tools that help you move around (mobility aids) if they are needed. These include: ? Canes. ? Walkers. ? Scooters. ? Crutches.  Turn on the lights when you go into a dark area. Replace any light bulbs as soon as they burn out.  Set up your furniture so you have a clear path. Avoid moving your furniture around.  If any of your floors are uneven, fix them.  If there are any pets around you, be aware of where they are.  Review your medicines with your doctor. Some medicines can make you feel dizzy. This can increase your chance of falling. Ask your doctor what other things that you can do to help prevent falls. This information is not intended to replace advice given to you by your health care provider. Make sure you discuss any questions you have with your health care provider. Document Released: 11/03/2008 Document Revised: 06/15/2015 Document Reviewed: 02/11/2014 Elsevier Interactive Patient Education  2018 [place] Maintenance, Female Adopting a healthy lifestyle and getting preventive care can go a long way to promote health and wellness. Talk with your health care provider about what schedule of regular examinations is right for you. This is a good chance for you to check in with your provider about disease prevention and staying healthy. In between  checkups, there are plenty of things you can do on your own. Experts have done a lot of research about which lifestyle changes and preventive measures are most likely to keep you healthy. Ask your health care provider for more information. Weight and diet Eat a healthy diet  Be sure to include plenty of vegetables, fruits, low-fat dairy products, and lean protein.  Do not eat a lot of foods  high in solid fats, added sugars, or salt.  Get regular exercise. This is one of the most important things you can do for your health. ? Most adults should exercise for at least 150 minutes each week. The exercise should increase your heart rate and make you sweat (moderate-intensity exercise). ? Most adults should also do strengthening exercises at least twice a week. This is in addition to the moderate-intensity exercise.  Maintain a healthy weight  Body mass index (BMI) is a measurement that can be used to identify possible weight problems. It estimates body fat based on height and weight. Your health care provider can help determine your BMI and help you achieve or maintain a healthy weight.  For females 77 years of age and older: ? A BMI below 18.5 is considered underweight. ? A BMI of 18.5 to 24.9 is normal. ? A BMI of 25 to 29.9 is considered overweight. ? A BMI of 30 and above is considered obese.  Watch levels of cholesterol and blood lipids  You should start having your blood tested for lipids and cholesterol at 77 years of age, then have this test every 5 years.  You may need to have your cholesterol levels checked more often if: ? Your lipid or cholesterol levels are high. ? You are older than 77 years of age. ? You are at high risk for heart disease.  Cancer screening Lung Cancer  Lung cancer screening is recommended for adults 31-50 years old who are at high risk for lung cancer because of a history of smoking.  A yearly low-dose CT scan of the lungs is recommended for people  who: ? Currently smoke. ? Have quit within the past 15 years. ? Have at least a 30-pack-year history of smoking. A pack year is smoking an average of one pack of cigarettes a day for 1 year.  Yearly screening should continue until it has been 15 years since you quit.  Yearly screening should stop if you develop a health problem that would prevent you from having lung cancer treatment.  Breast Cancer  Practice breast self-awareness. This means understanding how your breasts normally appear and feel.  It also means doing regular breast self-exams. Let your health care provider know about any changes, no matter how small.  If you are in your 20s or 30s, you should have a clinical breast exam (CBE) by a health care provider every 1-3 years as part of a regular health exam.  If you are 2 or older, have a CBE every year. Also consider having a breast X-ray (mammogram) every year.  If you have a family history of breast cancer, talk to your health care provider about genetic screening.  If you are at high risk for breast cancer, talk to your health care provider about having an MRI and a mammogram every year.  Breast cancer gene (BRCA) assessment is recommended for women who have family members with BRCA-related cancers. BRCA-related cancers include: ? Breast. ? Ovarian. ? Tubal. ? Peritoneal cancers.  Results of the assessment will determine the need for genetic counseling and BRCA1 and BRCA2 testing.  Cervical Cancer Your health care provider may recommend that you be screened regularly for cancer of the pelvic organs (ovaries, uterus, and vagina). This screening involves a pelvic examination, including checking for microscopic changes to the surface of your cervix (Pap test). You may be encouraged to have this screening done every 3 years, beginning at age 50.  For women ages 29-65, health  care providers may recommend pelvic exams and Pap testing every 3 years, or they may recommend  the Pap and pelvic exam, combined with testing for human papilloma virus (HPV), every 5 years. Some types of HPV increase your risk of cervical cancer. Testing for HPV may also be done on women of any age with unclear Pap test results.  Other health care providers may not recommend any screening for nonpregnant women who are considered low risk for pelvic cancer and who do not have symptoms. Ask your health care provider if a screening pelvic exam is right for you.  If you have had past treatment for cervical cancer or a condition that could lead to cancer, you need Pap tests and screening for cancer for at least 20 years after your treatment. If Pap tests have been discontinued, your risk factors (such as having a new sexual partner) need to be reassessed to determine if screening should resume. Some women have medical problems that increase the chance of getting cervical cancer. In these cases, your health care provider may recommend more frequent screening and Pap tests.  Colorectal Cancer  This type of cancer can be detected and often prevented.  Routine colorectal cancer screening usually begins at 77 years of age and continues through 77 years of age.  Your health care provider may recommend screening at an earlier age if you have risk factors for colon cancer.  Your health care provider may also recommend using home test kits to check for hidden blood in the stool.  A small camera at the end of a tube can be used to examine your colon directly (sigmoidoscopy or colonoscopy). This is done to check for the earliest forms of colorectal cancer.  Routine screening usually begins at age 52.  Direct examination of the colon should be repeated every 5-10 years through 76 years of age. However, you may need to be screened more often if early forms of precancerous polyps or small growths are found.  Skin Cancer  Check your skin from head to toe regularly.  Tell your health care provider about  any new moles or changes in moles, especially if there is a change in a mole's shape or color.  Also tell your health care provider if you have a mole that is larger than the size of a pencil eraser.  Always use sunscreen. Apply sunscreen liberally and repeatedly throughout the day.  Protect yourself by wearing long sleeves, pants, a wide-brimmed hat, and sunglasses whenever you are outside.  Heart disease, diabetes, and high blood pressure  High blood pressure causes heart disease and increases the risk of stroke. High blood pressure is more likely to develop in: ? People who have blood pressure in the high end of the normal range (130-139/85-89 mm Hg). ? People who are overweight or obese. ? People who are African American.  If you are 53-35 years of age, have your blood pressure checked every 3-5 years. If you are 68 years of age or older, have your blood pressure checked every year. You should have your blood pressure measured twice-once when you are at a hospital or clinic, and once when you are not at a hospital or clinic. Record the average of the two measurements. To check your blood pressure when you are not at a hospital or clinic, you can use: ? An automated blood pressure machine at a pharmacy. ? A home blood pressure monitor.  If you are between 29 years and 1 years old, ask  your health care provider if you should take aspirin to prevent strokes.  Have regular diabetes screenings. This involves taking a blood sample to check your fasting blood sugar level. ? If you are at a normal weight and have a low risk for diabetes, have this test once every three years after 77 years of age. ? If you are overweight and have a high risk for diabetes, consider being tested at a younger age or more often. Preventing infection Hepatitis B  If you have a higher risk for hepatitis B, you should be screened for this virus. You are considered at high risk for hepatitis B if: ? You were born in  a country where hepatitis B is common. Ask your health care provider which countries are considered high risk. ? Your parents were born in a high-risk country, and you have not been immunized against hepatitis B (hepatitis B vaccine). ? You have HIV or AIDS. ? You use needles to inject street drugs. ? You live with someone who has hepatitis B. ? You have had sex with someone who has hepatitis B. ? You get hemodialysis treatment. ? You take certain medicines for conditions, including cancer, organ transplantation, and autoimmune conditions.  Hepatitis C  Blood testing is recommended for: ? Everyone born from [birth year] through 1965. ? Anyone with known risk factors for hepatitis C.  Sexually transmitted infections (STIs)  You should be screened for sexually transmitted infections (STIs) including gonorrhea and chlamydia if: ? You are sexually active and are younger than 77 years of age. ? You are older than 77 years of age and your health care provider tells you that you are at risk for this type of infection. ? Your sexual activity has changed since you were last screened and you are at an increased risk for chlamydia or gonorrhea. Ask your health care provider if you are at risk.  If you do not have HIV, but are at risk, it may be recommended that you take a prescription medicine daily to prevent HIV infection. This is called pre-exposure prophylaxis (PrEP). You are considered at risk if: ? You are sexually active and do not regularly use condoms or know the HIV status of your partner(s). ? You take drugs by injection. ? You are sexually active with a partner who has HIV.  Talk with your health care provider about whether you are at high risk of being infected with HIV. If you choose to begin PrEP, you should first be tested for HIV. You should then be tested every 3 months for as long as you are taking PrEP. Pregnancy  If you are premenopausal and you may become pregnant, ask your health  care provider about preconception counseling.  If you may become pregnant, take 400 to 800 micrograms (mcg) of folic acid every day.  If you want to prevent pregnancy, talk to your health care provider about birth control (contraception). Osteoporosis and menopause  Osteoporosis is a disease in which the bones lose minerals and strength with aging. This can result in serious bone fractures. Your risk for osteoporosis can be identified using a bone density scan.  If you are 70 years of age or older, or if you are at risk for osteoporosis and fractures, ask your health care provider if you should be screened.  Ask your health care provider whether you should take a calcium or vitamin D supplement to lower your risk for osteoporosis.  Menopause may have certain physical symptoms and risks.  Hormone  replacement therapy may reduce some of these symptoms and risks. Talk to your health care provider about whether hormone replacement therapy is right for you. Follow these instructions at home:  Schedule regular health, dental, and eye exams.  Stay current with your immunizations.  Do not use any tobacco products including cigarettes, chewing tobacco, or electronic cigarettes.  If you are pregnant, do not drink alcohol.  If you are breastfeeding, limit how much and how often you drink alcohol.  Limit alcohol intake to no more than 1 drink per day for nonpregnant women. One drink equals 12 ounces of beer, 5 ounces of wine, or 1 ounces of hard liquor.  Do not use street drugs.  Do not share needles.  Ask your health care provider for help if you need support or information about quitting drugs.  Tell your health care provider if you often feel depressed.  Tell your health care provider if you have ever been abused or do not feel safe at home. This information is not intended to replace advice given to you by your health care provider. Make sure you discuss any questions you have with  your health care provider. Document Released: 07/23/2010 Document Revised: 06/15/2015 Document Reviewed: 10/11/2014 Elsevier Interactive Patient Education  [employer].

## 2017-12-04 NOTE — Progress Notes (Signed)
I have reviewed and agree with note, evaluation, plan.   Thrilled with continued weight loss efforts!   Garret Reddish, MD

## 2017-12-14 NOTE — Progress Notes (Signed)
Cardiology Office Note:    Date:  12/15/2017   ID:  [name], DOB [birth year], MRN [id number]  PCP:  Marin Olp, MD  Cardiologist:  Sinclair Grooms, MD   Referring MD: Marin Olp, MD   Chief Complaint  Patient presents with  . Coronary Artery Disease  . Advice Only    Preoperative clearance    History of Present Illness:    [name] is a 77 y.o. female with a hx of obesity, diabetes mellitus, bariatric surgery, hyperlipidemia, hypertension, high risk coronary calcium score and angina pectoris.  An episode of angina led the patient to cardiac evaluation.  The episode occurred in May or June.  His chest tightness that lasted less than 30 minutes.  There is been no recurrence since that time.  She is relatively sedentary due to bilateral knee discomfort for which she hopes to undergo surgery by Dr. Wynelle Link.sometime within the next 6 months.  Surgery had been on hold until she could lose weight and get to a BMI less than 40.  She has lost 20 pounds.  This is been predominantly through weight watchers.  Specifically, she denies orthopnea, PND, and recurrent chest pain.  She is not exercising.  She does not smoke.  She denies tobacco use. ,  Past Medical History:  Diagnosis Date  . Anemia   . Arthritis    osteoarthritis - shoulder, knees & hips  . Asthma   . Depression   . Diabetes mellitus without complication (HCC)    no meds  . Hypertension    Dr. Orinda Kenner manages BP, pt. reports MD has not found a need for treatment   . Hypothyroidism   . Sleep difficulties    had sleep study -2009, prior to gastric surgery, told that there was not a need for f/u  . Thyroid disease   . Vitamin B 12 deficiency     Past Surgical History:  Procedure Laterality Date  . ABDOMINAL HYSTERECTOMY    . BARIATRIC SURGERY    . CARDIAC CATHETERIZATION     [facility] y[facility]  . CATARACT EXTRACTION, BILATERAL     late 2018  . OOPHORECTOMY    . pantallor  arthrodesis with rod placement left foot    . TONSILLECTOMY    . TOTAL SHOULDER ARTHROPLASTY Left 03/12/2012   Procedure: TOTAL SHOULDER ARTHROPLASTY;  Surgeon: Marin Shutter, MD;  Location: [place];  Service: Orthopedics;  Laterality: Left;    Current Medications: Current Meds  Medication Sig  . acetaminophen (TYLENOL) 500 MG tablet Take 1,000 mg by mouth every 8 (eight) hours as needed for mild pain or headache.  . albuterol (PROVENTIL HFA;VENTOLIN HFA) 108 (90 Base) MCG/ACT inhaler Inhale 1-2 puffs into the lungs every 4 (four) hours as needed for wheezing or shortness of breath.  . Alcohol Swabs PADS Use to check blood sugar once a day  . amLODipine (NORVASC) 10 MG tablet Take 1 tablet (10 mg total) by mouth daily.  Marland Kitchen aspirin 81 MG tablet Take 81 mg by mouth daily.  Marland Kitchen atorvastatin (LIPITOR) 20 MG tablet Take 1 tablet (20 mg total) by mouth once a week.  . Blood Glucose Calibration (TRUE METRIX LEVEL 2) Normal SOLN Use to calibrate meter as needed  . Cholecalciferol (VITAMIN D) 1000 UNITS capsule Take 5,000 Units by mouth daily.   . cyanocobalamin (,VITAMIN B-12,) 1000 MCG/ML injection Inject 1 mL (1,000 mcg total) into the muscle every 30 (thirty) days. Uses  on amonthly basis  . desvenlafaxine (PRISTIQ) 100 MG 24 hr tablet Take 1 tablet (100 mg total) by mouth daily.  . diclofenac (VOLTAREN) 75 MG EC tablet Take 75 mg by mouth daily.  . Evolocumab (REPATHA SURECLICK) 801 MG/ML SOAJ Inject 1 pen into the skin every 14 (fourteen) days.  . FeFum-FePo-FA-B Cmp-C-Zn-Mn-Cu (SE-TAN PLUS) 162-115.2-1 MG CAPS Take 1 capsule by mouth 2 (two) times daily.  . fluticasone (FLONASE) 50 MCG/ACT nasal spray USE 1 SPRAY IN EACH NOSTRIL EVERY DAY  . glucose blood test strip Test blood sugar once per day  . HYDROcodone-acetaminophen (NORCO/VICODIN) 5-325 MG tablet Take 1 tablet by mouth every 6 (six) hours as needed.  . Levothyroxine Sodium (TIROSINT) 150 MCG CAPS Take 2 capsules by mouth 2 out of 3 days; in  morning on empty stomach for thyroid (name brand only DAW)  . loperamide (IMODIUM) 2 MG capsule Take 4 mg by mouth as needed for diarrhea or loose stools.  Marland Kitchen LORazepam (ATIVAN) 0.5 MG tablet Take 0.5-1 tablets (0.25-0.5 mg total) by mouth daily as needed for anxiety (do not drive for 8 hours after taking).  . montelukast (SINGULAIR) 10 MG tablet Take 1 tablet (10 mg total) by mouth daily with breakfast.  . nitroGLYCERIN (NITROSTAT) 0.4 MG SL tablet Place 1 tablet (0.4 mg total) under the tongue every 5 (five) minutes as needed for chest pain.  . Omega-3 Fatty Acids (FISH OIL) 1000 MG CAPS Take 1,000 mg by mouth daily.   Marland Kitchen Propylene Glycol (SYSTANE COMPLETE OP) Apply 1 drop to eye daily.  . TRUEPLUS LANCETS 30G MISC Check blood sugar once per day     Allergies:   Flexeril [cyclobenzaprine]; Ace inhibitors; Azithromycin; and Codeine   Social History   Socioeconomic History  . Marital status: Widowed    Spouse name: Not on file  . Number of children: Not on file  . Years of education: Not on file  . Highest education level: Not on file  Occupational History  . Occupation: retired Optician, dispensing: RETIRED  Social Needs  . Financial resource strain: Not on file  . Food insecurity:    Worry: Not on file    Inability: Not on file  . Transportation needs:    Medical: Not on file    Non-medical: Not on file  Tobacco Use  . Smoking status: Former Smoker    Packs/day: 0.70    Years: 20.00    Pack years: 14.00    Last attempt to quit: 03/04/1978    Years since quitting: 39.8  . Smokeless tobacco: Never Used  Substance and Sexual Activity  . Alcohol use: Yes    Alcohol/week: 2.0 standard drinks    Types: 2 Glasses of wine per week    Comment: with dinner  or social rare -   . Drug use: No  . Sexual activity: Not Currently  Lifestyle  . Physical activity:    Days per week: Not on file    Minutes per session: Not on file  . Stress: Not on file  Relationships  . Social  connections:    Talks on phone: Not on file    Gets together: Not on file    Attends religious service: Not on file    Active member of club or organization: Not on file    Attends meetings of clubs or organizations: Not on file    Relationship status: Not on file  Other Topics Concern  . Not on file  Social History Narrative   Widowed 2013. 2 kids. 4 grandkids. Oldest daughter lives in [place] and has 2 grandkids that are now in Scientist, physiological, PT school at [employer].       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 There is no history of Breast cancer.  ROS:   Please see the history of present illness.    Occasional shortness of breath, back pain, muscle pain, difficulty with balance.  Wants to be cleared for upcoming potential knee replacement surgery.  Easy fatigue.  Not sleeping well.  All other systems reviewed and are negative.  EKGs/Labs/Other Studies Reviewed:    The following studies were reviewed today:  Coronary Calcium Score 09/2017: IMPRESSION: Coronary calcium score of 990. This was 74 percentile for age and sex matched control. This represents a high calcium score, in the presence of symptoms consider a functional test such as nuclear stress test or coronary CTA with CT FFR.  EKG:  EKG is not ordered today.    Recent Labs: 11/19/2017: ALT 17; BUN 16; Creatinine, Ser 0.89; Hemoglobin 14.0; Platelets 218.0; Potassium 4.0; Sodium 137  Recent Lipid Panel    Component Value Date/Time   CHOL 136 11/19/2017 0914   TRIG 141.0 11/19/2017 0914   HDL 34.70 (L) 11/19/2017 0914   CHOLHDL 4 11/19/2017 0914   VLDL 28.2 11/19/2017 0914   LDLCALC 73 11/19/2017 0914   LDLDIRECT 109.0 01/04/2016 1347    Physical Exam:    VS:  BP 122/74   Pulse 71   Ht  5\' 1"  (1.549 m)   Wt 210 lb (95.3 kg)   SpO2 94%   BMI 39.68 kg/m     Wt Readings from Last 3 Encounters:  12/15/17 210 lb (95.3 kg)  12/04/17 210 lb (95.3 kg)  11/19/17 213 lb 6.4 oz (96.8 kg)     GEN: Obese. Well developed in no acute distress HEENT: Normal NECK: No JVD. LYMPHATICS: No lymphadenopathy CARDIAC: RRR, no murmur, no gallop, no edema. VASCULAR: 2+ bilateral radial and carotid pulses.  No bruits. RESPIRATORY:  Clear to auscultation without rales, wheezing or rhonchi  ABDOMEN: Soft, non-tender, non-distended, No pulsatile mass, MUSCULOSKELETAL: No deformity  SKIN: Warm and dry NEUROLOGIC:  Alert and oriented x 3 PSYCHIATRIC:  Normal affect   ASSESSMENT:    1. Coronary artery disease of native artery of native heart with stable angina pectoris ([place])   2. Essential hypertension   3. Preop cardiovascular exam   4. Well controlled type 2 diabetes mellitus ([place])   5. Hyperlipidemia, unspecified hyperlipidemia type   6. Morbid obesity ([place])    PLAN:    In order of problems listed above:  1. High risk coronary calcification based upon calcium score greater than 400.  Aggressive lipid-lowering although has difficulty with statins.  Most recent LDL was 73, which is acceptable.  I do not believe further up titration of lipid therapy is indicated.  We will do a Lexiscan myocardial perfusion study to rule out high risk subset. 2. Blood pressure is at target at 122/74 mmHg.  She has lost 21 pounds.  We discussed low-salt diet. 3. For upcoming knee surgery, she will be cleared if Lexiscan myocardial perfusion study is less than high risk. 4. Hemoglobin A1c target less  than 7. 5. LDL target 70 or less.  Currently achieving this with weight loss and once per week statin therapy.  Currently enrolled in the lipid clinic. 6. She is on weight watchers and has lost 21 pounds.  She is high risk for cardiac events with significant elevation and coronary calcium score.  She has not  had any recurrence of angina.  Myocardial perfusion study will be done to exclude excess risk/high risk subset.  If this is identified she will need to have coronary angiogram.  Otherwise we will continue medical therapy via the Ischemia trial subset.  Aggressive lipid-lowering is being achieved.  She is in the lipid clinic.  We hope to add PCSK9 therapy to further decrease risk.  Plan to clear for upcoming surgery if low risk nuclear study.  Greater than 50% of the time during this office visit was spent in education, counseling, and coordination of care related to underlying disease process and testing as outlined.    Medication Adjustments/Labs and Tests Ordered: Current medicines are reviewed at length with the patient today.  Concerns regarding medicines are outlined above.  Orders Placed This Encounter  Procedures  . MYOCARDIAL PERFUSION IMAGING   No orders of the defined types were placed in this encounter.   Patient Instructions  Medication Instructions:  Your physician recommends that you continue on your current medications as directed. Please refer to the Current Medication list given to you today.  If you need a refill on your cardiac medications before your next appointment, please call your pharmacy.   Lab work: None ordered If you have labs (blood work) drawn today and your tests are completely normal, you will receive your results only by: Marland Kitchen MyChart Message (if you have MyChart) OR . A paper copy in the mail If you have any lab test that is abnormal or we need to change your treatment, we will call you to review the results.  Testing/Procedures: Your physician has requested that you have a lexiscan myoview. For further information please visit [email or web address]. Please follow instruction sheet, as given.  Follow-Up: At [facility], you and your health needs are our priority.  As part of our continuing mission to provide you with exceptional heart care, we have  created designated Provider Care Teams.  These Care Teams include your primary Cardiologist (physician) and Advanced Practice Providers (APPs -  Physician Assistants and Nurse Practitioners) who all work together to provide you with the care you need, when you need it. . You will need a follow up appointment in 1 year.  Please call our office 2 months in advance to schedule this appointment.  You may see Sinclair Grooms, MD or one of the following Advanced Practice Providers on your designated Care Team:   . Truitt Merle, NP . Cecilie Kicks, NP . Kathyrn Drown, NP   Any Other Special Instructions Will Be Listed Below (If Applicable).     Signed, Sinclair Grooms, MD  12/15/2017 10:36 AM    [place]

## 2017-12-15 ENCOUNTER — Ambulatory Visit: Payer: Medicare HMO | Admitting: Interventional Cardiology

## 2017-12-15 ENCOUNTER — Encounter: Payer: Self-pay | Admitting: Interventional Cardiology

## 2017-12-15 VITALS — BP 122/74 | HR 71 | Ht 61.0 in | Wt 210.0 lb

## 2017-12-15 DIAGNOSIS — Z01810 Encounter for preprocedural cardiovascular examination: Secondary | ICD-10-CM

## 2017-12-15 DIAGNOSIS — I25118 Atherosclerotic heart disease of native coronary artery with other forms of angina pectoris: Secondary | ICD-10-CM | POA: Diagnosis not present

## 2017-12-15 DIAGNOSIS — E119 Type 2 diabetes mellitus without complications: Secondary | ICD-10-CM

## 2017-12-15 DIAGNOSIS — E785 Hyperlipidemia, unspecified: Secondary | ICD-10-CM

## 2017-12-15 DIAGNOSIS — I10 Essential (primary) hypertension: Secondary | ICD-10-CM | POA: Diagnosis not present

## 2017-12-15 NOTE — Patient Instructions (Signed)
Medication Instructions:  Your physician recommends that you continue on your current medications as directed. Please refer to the Current Medication list given to you today.  If you need a refill on your cardiac medications before your next appointment, please call your pharmacy.   Lab work: None ordered If you have labs (blood work) drawn today and your tests are completely normal, you will receive your results only by: Marland Kitchen MyChart Message (if you have MyChart) OR . A paper copy in the mail If you have any lab test that is abnormal or we need to change your treatment, we will call you to review the results.  Testing/Procedures: Your physician has requested that you have a lexiscan myoview. For further information please visit [email or web address]. Please follow instruction sheet, as given.  Follow-Up: At [facility], you and your health needs are our priority.  As part of our continuing mission to provide you with exceptional heart care, we have created designated Provider Care Teams.  These Care Teams include your primary Cardiologist (physician) and Advanced Practice Providers (APPs -  Physician Assistants and Nurse Practitioners) who all work together to provide you with the care you need, when you need it. . You will need a follow up appointment in 1 year.  Please call our office 2 months in advance to schedule this appointment.  You may see Sinclair Grooms, MD or one of the following Advanced Practice Providers on your designated Care Team:   . Truitt Merle, NP . Cecilie Kicks, NP . Kathyrn Drown, NP   Any Other Special Instructions Will Be Listed Below (If Applicable).

## 2017-12-16 LAB — HM DIABETES EYE EXAM

## 2017-12-17 ENCOUNTER — Encounter: Payer: Self-pay | Admitting: Family Medicine

## 2017-12-29 DIAGNOSIS — E210 Primary hyperparathyroidism: Secondary | ICD-10-CM | POA: Diagnosis not present

## 2017-12-30 ENCOUNTER — Telehealth (HOSPITAL_COMMUNITY): Payer: Self-pay | Admitting: *Deleted

## 2017-12-30 ENCOUNTER — Telehealth: Payer: Self-pay | Admitting: *Deleted

## 2017-12-30 NOTE — Telephone Encounter (Signed)
Left message on voicemail per DPR in reference to upcoming appointment scheduled on 01/02/18 with detailed instructions given per Myocardial Perfusion Study Information Sheet for the test. LM to arrive 15 minutes early, and that it is imperative to arrive on time for appointment to keep from having the test rescheduled. If you need to cancel or reschedule your appointment, please call the office within 24 hours of your appointment. Failure to do so may result in a cancellation of your appointment, and a $50 no show fee. Phone number given for call back for any questions.  Urias Sheek Jacqueline    

## 2017-12-30 NOTE — Telephone Encounter (Signed)
Follow Up:; ° ° °Returning your call. °

## 2018-01-01 DIAGNOSIS — E063 Autoimmune thyroiditis: Secondary | ICD-10-CM | POA: Diagnosis not present

## 2018-01-01 DIAGNOSIS — E559 Vitamin D deficiency, unspecified: Secondary | ICD-10-CM | POA: Diagnosis not present

## 2018-01-01 DIAGNOSIS — E210 Primary hyperparathyroidism: Secondary | ICD-10-CM | POA: Diagnosis not present

## 2018-01-01 DIAGNOSIS — M8580 Other specified disorders of bone density and structure, unspecified site: Secondary | ICD-10-CM | POA: Diagnosis not present

## 2018-01-01 DIAGNOSIS — E038 Other specified hypothyroidism: Secondary | ICD-10-CM | POA: Diagnosis not present

## 2018-01-02 ENCOUNTER — Ambulatory Visit (HOSPITAL_COMMUNITY): Payer: Medicare HMO | Attending: Interventional Cardiology

## 2018-01-02 DIAGNOSIS — I25118 Atherosclerotic heart disease of native coronary artery with other forms of angina pectoris: Secondary | ICD-10-CM

## 2018-01-02 LAB — MYOCARDIAL PERFUSION IMAGING
CHL CUP RESTING HR STRESS: 60 {beats}/min
LV dias vol: 71 mL (ref 46–106)
LV sys vol: 25 mL
Peak HR: 70 {beats}/min
SDS: 0
SRS: 4
SSS: 4
TID: 1.08

## 2018-01-02 MED ORDER — TECHNETIUM TC 99M TETROFOSMIN IV KIT
31.7000 | PACK | Freq: Once | INTRAVENOUS | Status: AC | PRN
  Administered 2018-01-02: 31.7 via INTRAVENOUS
  Filled 2018-01-02: qty 32

## 2018-01-02 MED ORDER — TECHNETIUM TC 99M TETROFOSMIN IV KIT
10.5000 | PACK | Freq: Once | INTRAVENOUS | Status: AC | PRN
  Administered 2018-01-02: 10.5 via INTRAVENOUS
  Filled 2018-01-02: qty 11

## 2018-01-02 MED ORDER — REGADENOSON 0.4 MG/5ML IV SOLN
0.4000 mg | Freq: Once | INTRAVENOUS | Status: AC
  Administered 2018-01-02: 0.4 mg via INTRAVENOUS

## 2018-01-08 DIAGNOSIS — E063 Autoimmune thyroiditis: Secondary | ICD-10-CM | POA: Diagnosis not present

## 2018-01-08 DIAGNOSIS — E038 Other specified hypothyroidism: Secondary | ICD-10-CM | POA: Diagnosis not present

## 2018-01-12 ENCOUNTER — Ambulatory Visit (INDEPENDENT_AMBULATORY_CARE_PROVIDER_SITE_OTHER): Payer: Medicare HMO | Admitting: Family Medicine

## 2018-01-12 ENCOUNTER — Encounter: Payer: Self-pay | Admitting: Family Medicine

## 2018-01-12 VITALS — BP 136/82 | HR 63 | Temp 97.5°F | Ht 61.0 in | Wt 206.0 lb

## 2018-01-12 DIAGNOSIS — L03115 Cellulitis of right lower limb: Secondary | ICD-10-CM | POA: Diagnosis not present

## 2018-01-12 MED ORDER — CEPHALEXIN 500 MG PO CAPS: 500.0000 mg | ORAL_CAPSULE | Freq: Four times a day (QID) | ORAL | 0 refills | Status: AC

## 2018-01-12 NOTE — Patient Instructions (Signed)
It was very nice to see you today!  You have cellulitis in your leg.  This is a skin infection.  Please start the Keflex.  Please let me know or let Dr. Yong Channel know if your symptoms worsen or do not improve the next few days.   Take care, Dr Jerline Pain   Cellulitis, Adult  Cellulitis is a skin infection. The infected area is often warm, red, swollen, and sore. It occurs most often in the arms and lower legs. It is very important to get treated for this condition. What are the causes? This condition is caused by bacteria. The bacteria enter through a break in the skin, such as a cut, burn, insect bite, open sore, or crack. What increases the risk? This condition is more likely to occur in people who:  Have a weak body defense system (immune system).  Have open cuts, burns, bites, or scrapes on the skin.  Are older than 77 years of age.  Have a blood sugar problem (diabetes).  Have a long-lasting (chronic) liver disease (cirrhosis) or kidney disease.  Are very overweight (obese).  Have a skin problem, such as: ? Itchy rash (eczema). ? Slow movement of blood in the veins (venous stasis). ? Fluid buildup below the skin (edema).  Have been treated with high-energy rays (radiation).  Use IV drugs. What are the signs or symptoms? Symptoms of this condition include:  Skin that is: ? Red. ? Streaking. ? Spotting. ? Swollen. ? Sore or painful when you touch it. ? Warm.  A fever.  Chills.  Blisters. How is this diagnosed? This condition is diagnosed based on:  Medical history.  Physical exam.  Blood tests.  Imaging tests. How is this treated? Treatment for this condition may include:  Medicines to treat infections or allergies.  Home care, such as: ? Rest. ? Placing cold or warm cloths (compresses) on the skin.  Hospital care, if the condition is very bad. Follow these instructions at home: Medicines  Take over-the-counter and prescription medicines only  as told by your doctor.  If you were prescribed an antibiotic medicine, take it as told by your doctor. Do not stop taking it even if you start to feel better. General instructions   Drink enough fluid to keep your pee (urine) pale yellow.  Do not touch or rub the infected area.  Raise (elevate) the infected area above the level of your heart while you are sitting or lying down.  Place cold or warm cloths on the area as told by your doctor.  Keep all follow-up visits as told by your doctor. This is important. Contact a doctor if:  You have a fever.  You do not start to get better after 1-2 days of treatment.  Your bone or joint under the infected area starts to hurt after the skin has healed.  Your infection comes back. This can happen in the same area or another area.  You have a swollen bump in the area.  You have new symptoms.  You feel ill and have muscle aches and pains. Get help right away if:  Your symptoms get worse.  You feel very sleepy.  You throw up (vomit) or have watery poop (diarrhea) for a long time.  You see red streaks coming from the area.  Your red area gets larger.  Your red area turns dark in color. These symptoms may represent a serious problem that is an emergency. Do not wait to see if the symptoms will go  away. Get medical help right away. Call your local emergency services (911 in the [place] Do not drive yourself to the hospital. Summary  Cellulitis is a skin infection. The area is often warm, red, swollen, and sore.  This condition is treated with medicines, rest, and cold and warm cloths.  Take all medicines only as told by your doctor.  Tell your doctor if symptoms do not start to get better after 1-2 days of treatment. This information is not intended to replace advice given to you by your health care provider. Make sure you discuss any questions you have with your health care provider. Document Released: 06/26/2007 Document  Revised: 05/29/2017 Document Reviewed: 05/29/2017 Elsevier Interactive Patient Education  2019 [employer].

## 2018-01-12 NOTE — Progress Notes (Signed)
   Subjective:  [name] is a 77 y.o. female who presents today for same-day appointment with a chief complaint of right leg pain.   HPI:  Leg Pain, Acute problem Started 2 days ago. Worsened over that time.  Located to right medial lower leg.  No obvious precipitating events.  Associated with redness, pain, and swelling to the area.  She has had cellulitis in the past and symptoms seem to be similar to that.  She has had a few body aches over the last couple of days.  No fevers or chills.  No treatments tried.  No other obvious alleviating or aggravating factors.  ROS: Per HPI  PMH: She reports that she quit smoking about 39 years ago. She has a 14.00 pack-year smoking history. She has never used smokeless tobacco. She reports current alcohol use of about 2.0 standard drinks of alcohol per week. She reports that she does not use drugs.  Objective:  Physical Exam: BP 136/82 (BP Location: Left Arm, Patient Position: Sitting, Cuff Size: Normal)   Pulse 63   Temp (!) 97.5 F (36.4 C) (Oral)   Ht 5\' 1"  (1.549 m)   Wt 206 lb (93.4 kg)   SpO2 98%   BMI 38.92 kg/m   Gen: NAD, resting comfortably Skin: Approximately 5 x 6 cm erythematous area on right anterior medial shin with surrounding edema.  Very tender to palpation.  No purulent drainage. MSK: DP and PT pulses 2+ bilaterally.  No calf tenderness.  Homans sign negative.  Assessment/Plan:  Right leg cellulitis She has had some myalgias, no other signs of systemic illness.  No signs of DVT.  Will start Keflex 500 mg 4 times daily for the next 7 days.  Discussed warning signs and reasons to return to care and seek emergent care.  Follow-up as needed.  Algis Greenhouse. Jerline Pain, MD 01/12/2018 11:32 AM

## 2018-01-16 ENCOUNTER — Telehealth: Payer: Self-pay

## 2018-01-16 DIAGNOSIS — E785 Hyperlipidemia, unspecified: Secondary | ICD-10-CM

## 2018-01-16 NOTE — Telephone Encounter (Signed)
Called to schedule labs 8 week post 1st dose repatha scheduled 2/27 8am

## 2018-02-12 DIAGNOSIS — M170 Bilateral primary osteoarthritis of knee: Secondary | ICD-10-CM | POA: Diagnosis not present

## 2018-02-13 ENCOUNTER — Telehealth: Payer: Self-pay | Admitting: *Deleted

## 2018-02-13 ENCOUNTER — Encounter: Payer: Self-pay | Admitting: Family Medicine

## 2018-02-13 ENCOUNTER — Ambulatory Visit (INDEPENDENT_AMBULATORY_CARE_PROVIDER_SITE_OTHER): Payer: 59 | Admitting: Family Medicine

## 2018-02-13 VITALS — BP 128/84 | HR 64 | Temp 98.7°F | Ht 61.0 in | Wt 199.6 lb

## 2018-02-13 DIAGNOSIS — J329 Chronic sinusitis, unspecified: Secondary | ICD-10-CM | POA: Diagnosis not present

## 2018-02-13 DIAGNOSIS — R059 Cough, unspecified: Secondary | ICD-10-CM

## 2018-02-13 DIAGNOSIS — R05 Cough: Secondary | ICD-10-CM | POA: Diagnosis not present

## 2018-02-13 MED ORDER — AMOXICILLIN-POT CLAVULANATE 875-125 MG PO TABS: 1.0000 | ORAL_TABLET | Freq: Two times a day (BID) | ORAL | 0 refills | Status: DC

## 2018-02-13 MED ORDER — METHYLPREDNISOLONE ACETATE 80 MG/ML IJ SUSP
80.0000 mg | Freq: Once | INTRAMUSCULAR | Status: AC
  Administered 2018-02-13: 80 mg via INTRAMUSCULAR

## 2018-02-13 MED ORDER — IPRATROPIUM BROMIDE 0.06 % NA SOLN: 2.0000 | Freq: Four times a day (QID) | NASAL | 0 refills | Status: DC

## 2018-02-13 NOTE — Progress Notes (Signed)
   Chief Complaint:  [name] is a 78 y.o. female who presents for same day appointment with a chief complaint of cough.   Assessment/Plan:  Cough/sinusitis Given that symptoms have been persistent for the last 2 weeks, will start course of Augmentin today.  Also start Atrovent nasal spray.  We will give 80 mg IM Depo-Medrol for her sore throat.  Recommended good oral hydration.  Continue over-the-counter analgesics as needed.  Discussed reasons to return to care.  Follow-up as needed.    Subjective:  HPI:  Cough, acute problem Started about 2 weeks ago. Associated ith sore throat, body aches, ear pain, and productive cough.  No specific treatments tried.  No fevers or chills.  No sick contacts.  No wheeze.  Symptoms are stable over the last couple of days. No other obvious alleviating or aggravating factors.   ROS: Per HPI  PMH: She reports that she quit smoking about 39 years ago. She has a 14.00 pack-year smoking history. She has never used smokeless tobacco. She reports current alcohol use of about 2.0 standard drinks of alcohol per week. She reports that she does not use drugs.      Objective:  Physical Exam: BP 128/84 (BP Location: Left Arm, Patient Position: Sitting, Cuff Size: Normal)   Pulse 64   Temp 98.7 F (37.1 C) (Oral)   Ht 5\' 1"  (1.549 m)   Wt 199 lb 9.6 oz (90.5 kg)   SpO2 97%   BMI 37.71 kg/m   Gen: NAD, resting comfortably HEENT: TMs with clear effusion.  OP erythematous with no exudate.  His mucosa erythematous and boggy laterally. CV: Regular rate and rhythm with no murmurs appreciated Pulm: Normal work of breathing, clear to auscultation bilaterally with no crackles, wheezes, or rhonchi     Shermon Bozzi M. Jerline Pain, MD 02/13/2018 4:45 PM

## 2018-02-13 NOTE — Patient Instructions (Signed)
Start the atrovent and augmentin  We will give you a cortisone shot today.   Please stay well hydrated.  You can take tylenol and/or motrin as needed for low grade fever and pain.  Please let me know if your symptoms worsen or fail to improve.  Take care, Dr Jerline Pain

## 2018-02-13 NOTE — Telephone Encounter (Signed)
   [facility] Pre-operative Risk Assessment    Request for surgical clearance:  1. What type of surgery is being performed? LEFT TOTAL KNEE   2. When is this surgery scheduled? 03/30/18   3. What type of clearance is required (medical clearance vs. Pharmacy clearance to hold med vs. Both)? MEDICAL  4. Are there any medications that need to be held prior to surgery and how long?ASA   5. Practice name and name of physician performing surgery? [facility]; DR. Wynelle Link   6. What is your office phone number [phone]    7.   What is your office fax number [phone]  8.   Anesthesia type (None, local, MAC, general) ? CHOICE   Julaine Hua 02/13/2018, 2:39 PM  _________________________________________________________________   (provider comments below)

## 2018-02-16 NOTE — Telephone Encounter (Signed)
   Primary Cardiologist: Sinclair Grooms, MD  Chart reviewed as part of pre-operative protocol coverage. Given past medical history and time since last visit, based on ACC/AHA guidelines, [name] would be at acceptable risk for the planned procedure without further cardiovascular testing per Dr. Tamala Julian, as stated in patients stress test result note 01/05/2018.   Dr. Tamala Julian, can you comment on whether patient can hold aspirin, and if yes, how long prior to upcoming knee replacement? Please route response back to P CV DIV PREOP.   Once I hear back from Dr. Tamala Julian regarding aspirin, will route this recommendation to the requesting party via [facility] fax function and remove from pre-op pool.   Abigail Butts, PA-C 02/16/2018, 11:45 AM

## 2018-02-16 NOTE — Telephone Encounter (Signed)
Okay to hols aspirin 5-7 days for surgery.

## 2018-02-17 ENCOUNTER — Other Ambulatory Visit: Payer: Self-pay

## 2018-02-17 ENCOUNTER — Telehealth: Payer: Self-pay | Admitting: Family Medicine

## 2018-02-17 MED ORDER — PREDNISONE 20 MG PO TABS: 20.0000 mg | ORAL_TABLET | Freq: Every day | ORAL | 0 refills | Status: DC

## 2018-02-17 NOTE — Telephone Encounter (Signed)
Can try a few days of prednisone 20mg  daily x5 days if she is interested.  Would like for her to come in for OV if not improving.  Algis Greenhouse. Jerline Pain, MD 02/17/2018 1:08 PM

## 2018-02-17 NOTE — Telephone Encounter (Signed)
Please advise 

## 2018-02-17 NOTE — Telephone Encounter (Signed)
Rx sent to pharmacy   

## 2018-02-17 NOTE — Telephone Encounter (Signed)
   Primary Cardiologist: Sinclair Grooms, MD  Chart reviewed as part of pre-operative protocol coverage. Given past medical history and time since last visit, based on ACC/AHA guidelines, [name] would be at acceptable risk for the planned procedure without further cardiovascular testing per Dr. Tamala Julian, as stated in patients stress test result note 01/05/2018. In addition, patient can hold aspirin 5-7 days prior to his upcoming surgery.   I will route this recommendation to the requesting party via Epic fax function and remove from the pre-op pool.   Abigail Butts, PA-C 02/17/18, 1:22 PM

## 2018-02-17 NOTE — Telephone Encounter (Signed)
See note  Copied from [place] [id number]. Topic: General - Inquiry >> Feb 17, 2018  8:58 AM [name] wrote: Reason for CRM: Pt was seen on 01.24.2020 by Dr. Jerline Pain. Pt states she is still exeriencing cough with mucus and it has not changed in color or consistency. Pt is now starting to have headaches and feels that the antibiotic that was prescribed is not working well. Pt wanted to come in or wanted to see if another medication can be called in to help with relief. Pt asked to please advise her of Dr. Robby Sermon

## 2018-02-24 ENCOUNTER — Other Ambulatory Visit: Payer: Self-pay | Admitting: Family Medicine

## 2018-02-27 ENCOUNTER — Other Ambulatory Visit: Payer: Self-pay | Admitting: Pharmacist

## 2018-02-27 ENCOUNTER — Telehealth: Payer: Self-pay | Admitting: Pharmacist

## 2018-02-27 MED ORDER — PRAVASTATIN SODIUM 20 MG PO TABS: 20.0000 mg | ORAL_TABLET | Freq: Every evening | ORAL | 11 refills | Status: DC

## 2018-02-27 NOTE — Telephone Encounter (Addendum)
See separate mychart message - pt not tolerating Repatha. She does not wish to try another statin. Will contact patient once bempedoic acid is FDA approved.

## 2018-03-02 ENCOUNTER — Encounter: Payer: Self-pay | Admitting: Family Medicine

## 2018-03-03 ENCOUNTER — Ambulatory Visit (INDEPENDENT_AMBULATORY_CARE_PROVIDER_SITE_OTHER): Payer: Medicare HMO | Admitting: Family Medicine

## 2018-03-03 ENCOUNTER — Encounter: Payer: Self-pay | Admitting: Family Medicine

## 2018-03-03 VITALS — BP 120/60 | HR 76 | Temp 98.8°F | Ht 61.0 in | Wt 199.0 lb

## 2018-03-03 DIAGNOSIS — E1169 Type 2 diabetes mellitus with other specified complication: Secondary | ICD-10-CM

## 2018-03-03 DIAGNOSIS — M159 Polyosteoarthritis, unspecified: Secondary | ICD-10-CM | POA: Diagnosis not present

## 2018-03-03 DIAGNOSIS — Z6837 Body mass index (BMI) 37.0-37.9, adult: Secondary | ICD-10-CM | POA: Diagnosis not present

## 2018-03-03 DIAGNOSIS — E785 Hyperlipidemia, unspecified: Secondary | ICD-10-CM

## 2018-03-03 DIAGNOSIS — E119 Type 2 diabetes mellitus without complications: Secondary | ICD-10-CM

## 2018-03-03 DIAGNOSIS — M791 Myalgia, unspecified site: Secondary | ICD-10-CM

## 2018-03-03 DIAGNOSIS — F3342 Major depressive disorder, recurrent, in full remission: Secondary | ICD-10-CM | POA: Diagnosis not present

## 2018-03-03 LAB — COMPREHENSIVE METABOLIC PANEL
ALT: 26 U/L (ref 0–35)
AST: 21 U/L (ref 0–37)
Albumin: 3.4 g/dL — ABNORMAL LOW (ref 3.5–5.2)
Alkaline Phosphatase: 91 U/L (ref 39–117)
BUN: 13 mg/dL (ref 6–23)
CO2: 27 mEq/L (ref 19–32)
Calcium: 10.2 mg/dL (ref 8.4–10.5)
Chloride: 99 mEq/L (ref 96–112)
Creatinine, Ser: 0.7 mg/dL (ref 0.40–1.20)
GFR: 80.94 mL/min (ref >60.00)
Glucose, Bld: 117 mg/dL — ABNORMAL HIGH (ref 70–99)
Potassium: 4.2 mEq/L (ref 3.5–5.1)
Sodium: 135 mEq/L (ref 135–145)
Total Bilirubin: 0.5 mg/dL (ref 0.2–1.2)
Total Protein: 6 g/dL (ref 6.0–8.3)

## 2018-03-03 LAB — CBC
HCT: 41.9 % (ref 36.0–46.0)
Hemoglobin: 13.7 g/dL (ref 12.0–15.0)
MCHC: 32.7 g/dL (ref 30.0–36.0)
MCV: 89.8 fl (ref 78.0–100.0)
Platelets: 237 10*3/uL (ref 150.0–400.0)
RBC: 4.67 Mil/uL (ref 3.87–5.11)
RDW: 15 % (ref 11.5–15.5)
WBC: 9.2 10*3/uL (ref 4.0–10.5)

## 2018-03-03 LAB — MICROALBUMIN / CREATININE URINE RATIO
Creatinine,U: 54.9 mg/dL
MICROALB/CREAT RATIO: 1.3 mg/g (ref 0.0–30.0)
Microalb, Ur: <0.7 mg/dL (ref 0.0–1.9)

## 2018-03-03 LAB — HEMOGLOBIN A1C: Hgb A1c MFr Bld: 6 % (ref 4.6–6.5)

## 2018-03-03 LAB — CK: Total CK: 21 U/L (ref 7–177)

## 2018-03-03 LAB — SEDIMENTATION RATE: Sed Rate: 66 mm/hr — ABNORMAL HIGH (ref 0–30)

## 2018-03-03 LAB — C-REACTIVE PROTEIN: CRP: 9.3 mg/dL (ref 0.5–20.0)

## 2018-03-03 MED ORDER — HYDROCODONE-ACETAMINOPHEN 5-325 MG PO TABS: 1.0000 | ORAL_TABLET | Freq: Four times a day (QID) | ORAL | 0 refills | Status: DC | PRN

## 2018-03-03 NOTE — Assessment & Plan Note (Addendum)
S: Patient with continued pain from her osteoarthritis of multiple joints--particularly her knees.  She uses hydrocodone sparingly- number 30 pills lasted her approximately 5 months.  She states that she gets reasonable relief of pain with this medication  Patient has history of morbid obesity with BMI over 35 and hyperlipidemia and diabetes.  She is trying to work on weight loss-she is down 7 pounds from just before Christmas which is rather impressive  Has surgery planned march 9th- oked by cariology.   A/P: Poor control- patient thinks use of Repatha has worsened arthralgia as well as myalgias- I refilled #30 of hydrocodone and encouraged sparing 1 at a time dose -UDS is up-to-date as of 06/04/2017 -Controlled substance contract 06/04/2017 - [place]-she has had #50 hydrocodone within the last 2 years- also given #10 Lorazepam last June.  No prescriptions outside of my refills -Good work so far on obesity-she will continue to work on this

## 2018-03-03 NOTE — Assessment & Plan Note (Addendum)
Myalgia/arthralgia/generalized weakness S: Mild poorly controlled on atorvastatin 20 mg once a week-she still has myalgias on this but tolerated it. With issues below has skipped that Friday dose.   She was later started on repatha- now on 4th dose of medication - two days later woke up and felt like she could not walk well. She states has had diffuse myalgias since that time- also feels like she has diffuse weakness- some trouble closing right hand. Upper chest aches as well - similar to other areas without shortness of breath, diaphoresis, palpitations. Pain medication does seem to help her symptoms- taking 2 at a time of the hydrocodone.   Monday the 3rd was last dose or repatha.  She called the lipid clinic and was told to stop the Repatha.  Per up-to-date 4% chance of myalgias on Repatha.  She asked me about half-life today  A/P: Patient is concerned that her myalgias are related to the Repatha-she has discontinued this.  We discussed half-life is 11 to 17 days and if it is related to the [place] may take some time to heal. -We will get CK level though doubt rhabdomyolysis.  Will get ESR and CRP- would consider prednisone if these were elevated for possible PMR - She reports some weakness in the right hand-this seems to be related to pain at the wrist.  Possible gout?  We discussed referring to sports medicine or considering treatment-she wants to defer unless she fails to improve with conservative measures at home-she is going to try icing-that has been helpful for her knees during this time. -I did refill her hydrocodone for her arthritic pains which he typically uses sparingly-she would like to use 2 at a time but I told her I thought this increase her risk of building tolerance and needing medicine after her upcoming knee surgery as well-asked her to limit to 1 and try to use as sparingly as possible

## 2018-03-03 NOTE — Assessment & Plan Note (Signed)
S: Has been well controlled without medicine recently Lab Results  Component Value Date   HGBA1C 6.6 (H) 11/19/2017   HGBA1C 6.6 (H) 06/04/2017   HGBA1C 6.0 02/05/2017   A/P: Good control-update A1c since we are drawing labs anyway

## 2018-03-03 NOTE — Progress Notes (Signed)
Phone [phone]   Subjective:  [name] is a 78 y.o. year old very pleasant female patient who presents for/with See problem oriented charting ROS-patient complains of diffuse myalgias and arthralgias.  Also feels diffusely weak.  Does have some upper chest aching without shortness of breath but feels similar to aches in other areas of her body.  Denies other symptoms like shortness of breath, palpitations, dizziness, diaphoresis.  BMI monitoring- elevated BMI noted: Body mass index is 37.6 kg/m. Encouraged need for healthy eating, regular exercise, weight loss.     BMI Metric Follow Up - 03/03/18 1022      BMI Metric Follow Up-Please document annually   BMI Metric Follow Up  Education provided       Past Medical History-  Patient Active Problem List   Diagnosis Date Noted  . CAD (coronary artery disease) 11/19/2017    Priority: High  . Hypercalcemia 12/29/2014    Priority: High  . Osteoarthritis 10/05/2013    Priority: High  . Well controlled type 2 diabetes mellitus ([place]) 07/18/2006    Priority: High  . Overactive bladder 09/03/2016    Priority: Medium  . Morbid obesity ([place]) 06/28/2015    Priority: Medium  . History of gastric bypass 02/08/2014    Priority: Medium  . Hyperlipidemia 01/05/2008    Priority: Medium  . Hypothyroidism 09/09/2007    Priority: Medium  . Major depression, recurrent, full remission ([place]) 07/18/2006    Priority: Medium  . Essential hypertension 07/18/2006    Priority: Medium  . ASTHMA 07/18/2006    Priority: Medium  . Allergic rhinitis 07/20/2014    Priority: Low  . CHEST WALL PAIN, ANTERIOR 10/09/2007    Priority: Low  . Anxiety 07/17/2017  . Osteopenia of left hip 07/17/2017    Medications- reviewed and updated Current Outpatient Medications  Medication Sig Dispense Refill  . acetaminophen (TYLENOL) 500 MG tablet Take 1,000 mg by mouth every 8 (eight) hours as needed for mild pain or headache.    . albuterol (PROVENTIL  HFA;VENTOLIN HFA) 108 (90 Base) MCG/ACT inhaler Inhale 1-2 puffs into the lungs every 4 (four) hours as needed for wheezing or shortness of breath. 1 Inhaler 6  . Alcohol Swabs PADS Use to check blood sugar once a day 100 each 11  . aspirin 81 MG tablet Take 81 mg by mouth daily.    Marland Kitchen atorvastatin (LIPITOR) 20 MG tablet TAKE 1 TABLET ONE TIME WEEKLY 13 tablet 3  . Blood Glucose Calibration (TRUE METRIX LEVEL 2) Normal SOLN Use to calibrate meter as needed 1 each 0  . Cholecalciferol (VITAMIN D) 1000 UNITS capsule Take 5,000 Units by mouth daily.     . cyanocobalamin (,VITAMIN B-12,) 1000 MCG/ML injection Inject 1 mL (1,000 mcg total) into the muscle every 30 (thirty) days. Uses on amonthly basis 10 mL 2  . desvenlafaxine (PRISTIQ) 100 MG 24 hr tablet TAKE 1 TABLET EVERY DAY 90 tablet 3  . diclofenac (VOLTAREN) 75 MG EC tablet Take 150 mg by mouth daily.     Marland Kitchen FeFum-FePo-FA-B Cmp-C-Zn-Mn-Cu (SE-TAN PLUS) 162-115.2-1 MG CAPS Take 1 capsule by mouth 2 (two) times daily. 180 capsule 3  . fluticasone (FLONASE) 50 MCG/ACT nasal spray USE 1 SPRAY IN EACH NOSTRIL EVERY DAY 32 g 3  . glucose blood test strip Test blood sugar once per day 100 each 3  . HYDROcodone-acetaminophen (NORCO/VICODIN) 5-325 MG tablet Take 1 tablet by mouth every 6 (six) hours as needed. 30 tablet 0  .  ipratropium (ATROVENT) 0.06 % nasal spray Place 2 sprays into both nostrils 4 (four) times daily. 15 mL 0  . Levothyroxine Sodium (TIROSINT) 150 MCG CAPS Take 2 capsules by mouth 2 out of 3 days; in morning on empty stomach for thyroid (name brand only DAW)    . loperamide (IMODIUM) 2 MG capsule Take 4 mg by mouth as needed for diarrhea or loose stools.    Marland Kitchen LORazepam (ATIVAN) 0.5 MG tablet Take 0.5-1 tablets (0.25-0.5 mg total) by mouth daily as needed for anxiety (do not drive for 8 hours after taking). 10 tablet 0  . montelukast (SINGULAIR) 10 MG tablet Take 1 tablet (10 mg total) by mouth daily with breakfast. 90 tablet 3  .  Omega-3 Fatty Acids (FISH OIL) 1000 MG CAPS Take 1,000 mg by mouth daily.     Marland Kitchen Propylene Glycol (SYSTANE COMPLETE OP) Apply 1 drop to eye daily.    . TRUEPLUS LANCETS 30G MISC Check blood sugar once per day 100 each 3  . nitroGLYCERIN (NITROSTAT) 0.4 MG SL tablet Place 1 tablet (0.4 mg total) under the tongue every 5 (five) minutes as needed for chest pain. 25 tablet 6     Objective:  BP 120/60 (BP Location: Left Arm, Patient Position: Sitting, Cuff Size: Large)   Pulse 76   Temp 98.8 F (37.1 C) (Oral)   Ht 5' 1"  (1.549 m)   Wt 199 lb (90.3 kg)   SpO2 97%   BMI 37.60 kg/m  Gen: NAD, resting comfortably CV: RRR no murmurs rubs or gallops Lungs: CTAB no crackles, wheeze, rhonchi Ext: no edema Skin: warm, dry Neuro: CN II-XII intact, sensation and reflexes normal throughout, 5/5 muscle strength in bilateral upper and lower extremities- other than right hand grip strength- seems to be limited by pain at the wrist. Normal finger to nose. Normal rapid alternating movements. No pronator drift. Normal romberg. Normal gait.  Msk: Tender to palpation in multiple muscle groups-also has some swelling of her right wrist with mild erythema and slight warmth     Assessment and Plan   Hyperlipidemia Myalgia/arthralgia/generalized weakness S: Mild poorly controlled on atorvastatin 20 mg once a week-she still has myalgias on this but tolerated it. With issues below has skipped that Friday dose.   She was later started on repatha- now on 4th dose of medication - two days later woke up and felt like she could not walk well. She states has had diffuse myalgias since that time- also feels like she has diffuse weakness- some trouble closing right hand. Upper chest aches as well - similar to other areas without shortness of breath, diaphoresis, palpitations. Pain medication does seem to help her symptoms- taking 2 at a time of the hydrocodone.   Monday the 3rd was last dose or repatha.  She called the  lipid clinic and was told to stop the Repatha.  Per up-to-date 4% chance of myalgias on Repatha.  She asked me about half-life today  A/P: Patient is concerned that her myalgias are related to the Repatha-she has discontinued this.  We discussed half-life is 11 to 17 days and if it is related to the [place] may take some time to heal. -We will get CK level though doubt rhabdomyolysis.  Will get ESR and CRP- would consider prednisone if these were elevated for possible PMR - She reports some weakness in the right hand-this seems to be related to pain at the wrist.  Possible gout?  We discussed referring to sports medicine  or considering treatment-she wants to defer unless she fails to improve with conservative measures at home-she is going to try icing-that has been helpful for her knees during this time. -I did refill her hydrocodone for her arthritic pains which he typically uses sparingly-she would like to use 2 at a time but I told her I thought this increase her risk of building tolerance and needing medicine after her upcoming knee surgery as well-asked her to limit to 1 and try to use as sparingly as possible  Osteoarthritis S: Patient with continued pain from her osteoarthritis of multiple joints--particularly her knees.  She uses hydrocodone sparingly- number 30 pills lasted her approximately 5 months.  She states that she gets reasonable relief of pain with this medication  Patient has history of morbid obesity with BMI over 35 and hyperlipidemia and diabetes.  She is trying to work on weight loss-she is down 7 pounds from just before Christmas which is rather impressive  Has surgery planned march 9th- oked by cariology.   A/P: Poor control- patient thinks use of Repatha has worsened arthralgia as well as myalgias- I refilled #30 of hydrocodone and encouraged sparing 1 at a time dose -UDS is up-to-date as of 06/04/2017 -Controlled substance contract 06/04/2017 - [place]-she has had #50  hydrocodone within the last 2 years- also given #10 Lorazepam last June.  No prescriptions outside of my refills -Good work so far on obesity-she will continue to work on this  Major depression, recurrent, full remission ([place]) S: Reasonable control-especially in light of current pain problems.  Patient continues Pristiq Depression screen [facility] 2/9 03/03/2018  Decreased Interest 1  Down, Depressed, Hopeless 1  PHQ - 2 Score 2  Altered sleeping 0  Tired, decreased energy 2  Change in appetite 0  Feeling bad or failure about yourself  0  Trouble concentrating 1  Moving slowly or fidgety/restless 0  Suicidal thoughts 0  PHQ-9 Score 5  Difficult doing work/chores Very difficult  Some recent data might be hidden  A/P:  Stable. Continue current medications.    Well controlled type 2 diabetes mellitus ([place]) S: Has been well controlled without medicine recently Lab Results  Component Value Date   HGBA1C 6.6 (H) 11/19/2017   HGBA1C 6.6 (H) 06/04/2017   HGBA1C 6.0 02/05/2017   A/P: Good control-update A1c since we are drawing labs anyway   Future Appointments  Date Time Provider [place]  03/19/2018  8:00 AM [facility]  03/23/2018  9:20 AM Marin Olp, MD [facility]  Wants to keep follow-up in early April  Lab/Order associations: Osteoarthritis of multiple joints, unspecified osteoarthritis type  Morbid obesity ([place]), Chronic  Major depression, recurrent, full remission ([place]), Chronic  Well controlled type 2 diabetes mellitus ([place]), Chronic - Plan: Urine Microalbumin w/creat. ratio, CBC, Comprehensive metabolic panel, Hemoglobin A1c  BMI 37.0-37.9, adult  Myalgia - Plan: CK (Creatine Kinase), Sedimentation rate, C-reactive protein  Hyperlipidemia associated with type 2 diabetes mellitus ([place])  Meds ordered this encounter  Medications  . HYDROcodone-acetaminophen (NORCO/VICODIN) 5-325 MG tablet    Sig: Take 1 tablet by mouth every 6  (six) hours as needed.    Dispense:  30 tablet    Refill:  0    OA of the knees-Chronic pain   Return precautions advised.  Garret Reddish, MD

## 2018-03-03 NOTE — Assessment & Plan Note (Signed)
S: Reasonable control-especially in light of current pain problems.  Patient continues Pristiq Depression screen [facility] 2/9 03/03/2018  Decreased Interest 1  Down, Depressed, Hopeless 1  PHQ - 2 Score 2  Altered sleeping 0  Tired, decreased energy 2  Change in appetite 0  Feeling bad or failure about yourself  0  Trouble concentrating 1  Moving slowly or fidgety/restless 0  Suicidal thoughts 0  PHQ-9 Score 5  Difficult doing work/chores Very difficult  Some recent data might be hidden  A/P:  Stable. Continue current medications.

## 2018-03-03 NOTE — Patient Instructions (Addendum)
Health Maintenance Due  Topic Date Due  . FOOT EXAM  Patient wants to postpone until visit in March 02/05/2018  . URINE MICROALBUMIN-pending/will be done with labs today 02/05/2018    Lets get some labs to look for any signs of possible muscle breakdown.  I am also going to get an ESR and CRP inflammation test to evaluate for possible polymyalgia rheumatica-which can cause diffuse weakness and pain.  I refilled her pain medicine-in hopes to avoid building tolerance lets try to just take 1 at a time.  Make sure your daily maximum Tylenol dose is 3000 mg.  Try icing the wrist-that for the weakness does not get better-please see [place] back.  Also see [place] back for new or worsening symptoms   Please stop by lab before you go If you do not have mychart- we will call you about results within 5 business days of [place] receiving them.  If you have mychart- we will send your results within 3 business days of [place] receiving them.  If abnormal or we want to clarify a result, we will call or mychart you to make sure you receive the message.  If you have questions or concerns or don't hear within 5-7 days, please send [place] a message or call us.

## 2018-03-04 ENCOUNTER — Encounter: Payer: Self-pay | Admitting: Family Medicine

## 2018-03-05 NOTE — Telephone Encounter (Signed)
See note  Copied from [place] [id number]. Topic: General - Other >> Mar 04, 2018  4:56 PM Bea Graff, NT wrote: Reason for CRM: Pt returning call to office. No documentation of call.

## 2018-03-09 NOTE — H&P (Signed)
TOTAL KNEE ADMISSION H&P  Patient is being admitted for left total knee arthroplasty.  Subjective:  Chief Complaint:left knee pain.  HPI: [name], 78 y.o. female, has a history of pain and functional disability in the left knee due to arthritis and has failed non-surgical conservative treatments for greater than 12 weeks to includecorticosteriod injections, viscosupplementation injections and activity modification.  Onset of symptoms was gradual, starting 5 years ago with gradually worsening course since that time. The patient noted no past surgery on the left knee(s).  Patient currently rates pain in the left knee(s) at 10 out of 10 with activity. Patient has worsening of pain with activity and weight bearing, pain that interferes with activities of daily living, crepitus and joint swelling.  Patient has evidence of bone-on-bone arthritis in the medial and patellofemoral compartments of the left knee with significant medial and patellofemoral narrowing on the right. by imaging studies. There is no active infection.  Patient Active Problem List   Diagnosis Date Noted  . CAD (coronary artery disease) 11/19/2017  . Anxiety 07/17/2017  . Osteopenia of left hip 07/17/2017  . Overactive bladder 09/03/2016  . Morbid obesity ([place]) 06/28/2015  . Hypercalcemia 12/29/2014  . Allergic rhinitis 07/20/2014  . History of gastric bypass 02/08/2014  . Osteoarthritis 10/05/2013  . Hyperlipidemia associated with type 2 diabetes mellitus ([place]) 01/05/2008  . CHEST WALL PAIN, ANTERIOR 10/09/2007  . Hypothyroidism 09/09/2007  . Well controlled type 2 diabetes mellitus ([place]) 07/18/2006  . Major depression, recurrent, full remission ([place]) 07/18/2006  . Essential hypertension 07/18/2006  . ASTHMA 07/18/2006   Past Medical History:  Diagnosis Date  . Anemia   . Arthritis    osteoarthritis - shoulder, knees & hips  . Asthma   . Depression   . Diabetes mellitus without complication (HCC)    no  meds  . Hypertension    Dr. Orinda Kenner manages BP, pt. reports MD has not found a need for treatment   . Hypothyroidism   . Sleep difficulties    had sleep study -2009, prior to gastric surgery, told that there was not a need for f/u  . Thyroid disease   . Vitamin B 12 deficiency     Past Surgical History:  Procedure Laterality Date  . ABDOMINAL HYSTERECTOMY    . BARIATRIC SURGERY    . CARDIAC CATHETERIZATION     [facility]- 30 yrs. ago  . CATARACT EXTRACTION, BILATERAL     late 2018  . OOPHORECTOMY    . pantallor arthrodesis with rod placement left foot    . TONSILLECTOMY    . TOTAL SHOULDER ARTHROPLASTY Left 03/12/2012   Procedure: TOTAL SHOULDER ARTHROPLASTY;  Surgeon: Marin Shutter, MD;  Location: [facility];  Service: Orthopedics;  Laterality: Left;    No current facility-administered medications for this encounter.    Current Outpatient Medications  Medication Sig Dispense Refill Last Dose  . acetaminophen (TYLENOL) 500 MG tablet Take 1,000 mg by mouth every 8 (eight) hours as needed for mild pain or headache.   Taking  . albuterol (PROVENTIL HFA;VENTOLIN HFA) 108 (90 Base) MCG/ACT inhaler Inhale 1-2 puffs into the lungs every 4 (four) hours as needed for wheezing or shortness of breath. 1 Inhaler 6 Taking  . Alcohol Swabs PADS Use to check blood sugar once a day 100 each 11 Taking  . aspirin 81 MG tablet Take 81 mg by mouth daily.   Taking  . atorvastatin (LIPITOR) 20 MG tablet TAKE 1 TABLET ONE TIME WEEKLY  13 tablet 3 Taking  . Blood Glucose Calibration (TRUE METRIX LEVEL 2) Normal SOLN Use to calibrate meter as needed 1 each 0 Taking  . Cholecalciferol (VITAMIN D) 1000 UNITS capsule Take 5,000 Units by mouth daily.    Taking  . cyanocobalamin (,VITAMIN B-12,) 1000 MCG/ML injection Inject 1 mL (1,000 mcg total) into the muscle every 30 (thirty) days. Uses on amonthly basis 10 mL 2 Taking  . desvenlafaxine (PRISTIQ) 100 MG 24 hr tablet TAKE 1 TABLET EVERY DAY 90 tablet 3 Taking  .  diclofenac (VOLTAREN) 75 MG EC tablet Take 150 mg by mouth daily.    Taking  . FeFum-FePo-FA-B Cmp-C-Zn-Mn-Cu (SE-TAN PLUS) 162-115.2-1 MG CAPS Take 1 capsule by mouth 2 (two) times daily. 180 capsule 3 Taking  . fluticasone (FLONASE) 50 MCG/ACT nasal spray USE 1 SPRAY IN EACH NOSTRIL EVERY DAY 32 g 3 Taking  . glucose blood test strip Test blood sugar once per day 100 each 3 Taking  . HYDROcodone-acetaminophen (NORCO/VICODIN) 5-325 MG tablet Take 1 tablet by mouth every 6 (six) hours as needed. 30 tablet 0   . ipratropium (ATROVENT) 0.06 % nasal spray Place 2 sprays into both nostrils 4 (four) times daily. 15 mL 0 Taking  . Levothyroxine Sodium (TIROSINT) 150 MCG CAPS Take 2 capsules by mouth 2 out of 3 days; in morning on empty stomach for thyroid (name brand only DAW)   Taking  . loperamide (IMODIUM) 2 MG capsule Take 4 mg by mouth as needed for diarrhea or loose stools.   Taking  . LORazepam (ATIVAN) 0.5 MG tablet Take 0.5-1 tablets (0.25-0.5 mg total) by mouth daily as needed for anxiety (do not drive for 8 hours after taking). 10 tablet 0 Taking  . montelukast (SINGULAIR) 10 MG tablet Take 1 tablet (10 mg total) by mouth daily with breakfast. 90 tablet 3 Taking  . nitroGLYCERIN (NITROSTAT) 0.4 MG SL tablet Place 1 tablet (0.4 mg total) under the tongue every 5 (five) minutes as needed for chest pain. 25 tablet 6 Taking  . Omega-3 Fatty Acids (FISH OIL) 1000 MG CAPS Take 1,000 mg by mouth daily.    Taking  . Propylene Glycol (SYSTANE COMPLETE OP) Apply 1 drop to eye daily.   Taking  . TRUEPLUS LANCETS 30G MISC Check blood sugar once per day 100 each 3 Taking   Allergies  Allergen Reactions  . Flexeril [Cyclobenzaprine]     On Pristiq  With possible serotonin reaction to flexeril  . Ace Inhibitors     States after a surgery she was told this. Not sure what the reason was.   . Azithromycin     REACTION: Rash  . Codeine     REACTION: Upset stomach  . Repatha [Evolocumab]     Myalgias      Social History   Tobacco Use  . Smoking status: Former Smoker    Packs/day: 0.70    Years: 20.00    Pack years: 14.00    Last attempt to quit: 03/04/1978    Years since quitting: 40.0  . Smokeless tobacco: Never Used  Substance Use Topics  . Alcohol use: Yes    Alcohol/week: 2.0 standard drinks    Types: 2 Glasses of wine per week    Comment: with dinner  or social rare -     Family History  Problem Relation Age of Onset  . Stroke Mother   . Liver cancer Father   . Cancer Father  liver  . Breast cancer Neg Hx      ROS  Objective:  Physical Exam  Vital signs in last 24 hours: BP: ()/()  Arterial Line BP: ()/()   Labs:   Estimated body mass index is 37.6 kg/m as calculated from the following:   Height as of 03/03/18: 5\' 1"  (1.549 m).   Weight as of 03/03/18: 90.3 kg.   Imaging Review Plain radiographs demonstrate severe degenerative joint disease of the left knee(s). The overall alignment isneutral. The bone quality appears to be adequate for age and reported activity level.      Assessment/Plan:  End stage arthritis, left knee   The patient history, physical examination, clinical judgment of the provider and imaging studies are consistent with end stage degenerative joint disease of the left knee(s) and total knee arthroplasty is deemed medically necessary. The treatment options including medical management, injection therapy arthroscopy and arthroplasty were discussed at length. The risks and benefits of total knee arthroplasty were presented and reviewed. The risks due to aseptic loosening, infection, stiffness, patella tracking problems, thromboembolic complications and other imponderables were discussed. The patient acknowledged the explanation, agreed to proceed with the plan and consent was signed. Patient is being admitted for inpatient treatment for surgery, pain control, PT, OT, prophylactic antibiotics, VTE prophylaxis, progressive ambulation and  ADL's and discharge planning. The patient is planning to be discharged home with outpatient therapy at [facility].    Anticipated LOS equal to or greater than 2 midnights due to - Age 76 and older with one or more of the following:  - Obesity  - Expected need for hospital services (PT, OT, Nursing) required for safe  discharge  - Anticipated need for postoperative skilled nursing care or inpatient rehab  - Active co-morbidities: Diabetes OR   - Unanticipated findings during/Post Surgery: None  - Patient is a high risk of re-admission due to: None   Therapy Plans: outpatient therapy at [facility] Disposition: Home with daughter  Planned DVT Prophylaxis: aspirin 325mg  BID DME needed: 3-n-1 PCP: Dr. Yong Channel (PCP), Dr. Daneen Schick III (Cardiologist) TXA: IV Allergies: Azithromycin - rash, codeine- nausea, cyclobenzaprine - interacted with Pristiq Anesthesia Concerns: none BMI: 37.7  Last HgbA1c: 6%  Other: none  - Patient was instructed on what medications to stop prior to surgery. - Follow-up visit in 2 weeks with Dr. Wynelle Link - Begin physical therapy following surgery - Pre-operative lab work as pre-surgical testing - Prescriptions will be provided in hospital at time of discharge  Griffith Citron, PA-C Orthopedic Surgery [facility] [place]

## 2018-03-09 NOTE — Telephone Encounter (Signed)
Copied from [place] [id number]. Topic: General - Call Back - No Documentation >> Mar 09, 2018  4:30 PM Vernona Rieger wrote: Reason for CRM: patient said she missed 2 calls today from [facility], she wants her to call her back before the office closes

## 2018-03-09 NOTE — Telephone Encounter (Signed)
[name] please call pt.

## 2018-03-12 ENCOUNTER — Ambulatory Visit: Payer: 59 | Admitting: Family Medicine

## 2018-03-13 ENCOUNTER — Encounter: Payer: Self-pay | Admitting: Family Medicine

## 2018-03-16 ENCOUNTER — Ambulatory Visit (INDEPENDENT_AMBULATORY_CARE_PROVIDER_SITE_OTHER): Payer: Medicare HMO | Admitting: Family Medicine

## 2018-03-16 ENCOUNTER — Encounter: Payer: Self-pay | Admitting: Family Medicine

## 2018-03-16 VITALS — BP 110/70 | HR 78 | Temp 98.2°F | Ht 61.0 in | Wt 193.4 lb

## 2018-03-16 DIAGNOSIS — E1169 Type 2 diabetes mellitus with other specified complication: Secondary | ICD-10-CM | POA: Diagnosis not present

## 2018-03-16 DIAGNOSIS — M791 Myalgia, unspecified site: Secondary | ICD-10-CM

## 2018-03-16 DIAGNOSIS — E785 Hyperlipidemia, unspecified: Secondary | ICD-10-CM

## 2018-03-16 DIAGNOSIS — R700 Elevated erythrocyte sedimentation rate: Secondary | ICD-10-CM | POA: Diagnosis not present

## 2018-03-16 DIAGNOSIS — Z8739 Personal history of other diseases of the musculoskeletal system and connective tissue: Secondary | ICD-10-CM | POA: Insufficient documentation

## 2018-03-16 DIAGNOSIS — M353 Polymyalgia rheumatica: Secondary | ICD-10-CM | POA: Insufficient documentation

## 2018-03-16 LAB — SEDIMENTATION RATE: Sed Rate: 47 mm/hr — ABNORMAL HIGH (ref 0–30)

## 2018-03-16 LAB — LDL CHOLESTEROL, DIRECT: Direct LDL: 101 mg/dL

## 2018-03-16 MED ORDER — HYDROCODONE-ACETAMINOPHEN 5-325 MG PO TABS: 1.0000 | ORAL_TABLET | Freq: Four times a day (QID) | ORAL | 0 refills | Status: DC | PRN

## 2018-03-16 NOTE — Patient Instructions (Addendum)
Health Maintenance Due  Topic Date Due  . FOOT EXAM Done today 02/05/2018   Please stop by lab before you go If you do not have mychart- we will call you about results within 5 business days of [place] receiving them.  If you have mychart- we will send your results within 3 business days of [place] receiving them.  If abnormal or we want to clarify a result, we will call or mychart you to make sure you receive the message.  If you have questions or concerns or don't hear within 5-7 days, please send [place] a message or call us.   Refilled #30 of hydrocodone which you can pick up on 1st of march if needed  We will decide on prednisone based on ESR elevation

## 2018-03-16 NOTE — Progress Notes (Signed)
Phone [phone]   Subjective:  [name] is a 78 y.o. year old very pleasant female patient who presents for/with See problem oriented charting ROS- No chest pain or shortness of breath. No headache or blurry vision.    Past Medical History-  Patient Active Problem List   Diagnosis Date Noted  . CAD (coronary artery disease) 11/19/2017    Priority: High  . Hypercalcemia 12/29/2014    Priority: High  . Osteoarthritis 10/05/2013    Priority: High  . Well controlled type 2 diabetes mellitus ([place]) 07/18/2006    Priority: High  . Overactive bladder 09/03/2016    Priority: Medium  . Morbid obesity ([place]) 06/28/2015    Priority: Medium  . History of gastric bypass 02/08/2014    Priority: Medium  . Hyperlipidemia associated with type 2 diabetes mellitus ([place]) 01/05/2008    Priority: Medium  . Hypothyroidism 09/09/2007    Priority: Medium  . Major depression, recurrent, full remission ([place]) 07/18/2006    Priority: Medium  . Essential hypertension 07/18/2006    Priority: Medium  . ASTHMA 07/18/2006    Priority: Medium  . Allergic rhinitis 07/20/2014    Priority: Low  . CHEST WALL PAIN, ANTERIOR 10/09/2007    Priority: Low  . Anxiety 07/17/2017  . Osteopenia of left hip 07/17/2017    Medications- reviewed and updated Current Outpatient Medications  Medication Sig Dispense Refill  . acetaminophen (TYLENOL) 500 MG tablet Take 1,000 mg by mouth every 8 (eight) hours as needed for mild pain or headache.    . albuterol (PROVENTIL HFA;VENTOLIN HFA) 108 (90 Base) MCG/ACT inhaler Inhale 1-2 puffs into the lungs every 4 (four) hours as needed for wheezing or shortness of breath. 1 Inhaler 6  . Alcohol Swabs PADS Use to check blood sugar once a day 100 each 11  . aspirin 81 MG tablet Take 81 mg by mouth daily.    Marland Kitchen atorvastatin (LIPITOR) 20 MG tablet TAKE 1 TABLET ONE TIME WEEKLY 13 tablet 3  . Blood Glucose Calibration (TRUE METRIX LEVEL 2) Normal SOLN Use to calibrate meter  as needed 1 each 0  . Cholecalciferol (VITAMIN D) 1000 UNITS capsule Take 5,000 Units by mouth daily.     . cyanocobalamin (,VITAMIN B-12,) 1000 MCG/ML injection Inject 1 mL (1,000 mcg total) into the muscle every 30 (thirty) days. Uses on amonthly basis 10 mL 2  . desvenlafaxine (PRISTIQ) 100 MG 24 hr tablet TAKE 1 TABLET EVERY DAY 90 tablet 3  . diclofenac (VOLTAREN) 75 MG EC tablet Take 150 mg by mouth daily.     Marland Kitchen FeFum-FePo-FA-B Cmp-C-Zn-Mn-Cu (SE-TAN PLUS) 162-115.2-1 MG CAPS Take 1 capsule by mouth 2 (two) times daily. 180 capsule 3  . fluticasone (FLONASE) 50 MCG/ACT nasal spray USE 1 SPRAY IN EACH NOSTRIL EVERY DAY 32 g 3  . glucose blood test strip Test blood sugar once per day 100 each 3  . HYDROcodone-acetaminophen (NORCO/VICODIN) 5-325 MG tablet Take 1 tablet by mouth every 6 (six) hours as needed (may refill on 03/22/2018). 30 tablet 0  . ipratropium (ATROVENT) 0.06 % nasal spray Place 2 sprays into both nostrils 4 (four) times daily. 15 mL 0  . Levothyroxine Sodium (TIROSINT) 150 MCG CAPS Take 2 capsules by mouth 2 out of 3 days; in morning on empty stomach for thyroid (name brand only DAW)    . loperamide (IMODIUM) 2 MG capsule Take 4 mg by mouth as needed for diarrhea or loose stools.    Marland Kitchen LORazepam (ATIVAN) 0.5  MG tablet Take 0.5-1 tablets (0.25-0.5 mg total) by mouth daily as needed for anxiety (do not drive for 8 hours after taking). 10 tablet 0  . montelukast (SINGULAIR) 10 MG tablet Take 1 tablet (10 mg total) by mouth daily with breakfast. 90 tablet 3  . Omega-3 Fatty Acids (FISH OIL) 1000 MG CAPS Take 1,000 mg by mouth daily.     Marland Kitchen Propylene Glycol (SYSTANE COMPLETE OP) Apply 1 drop to eye daily.    . TRUEPLUS LANCETS 30G MISC Check blood sugar once per day 100 each 3  . nitroGLYCERIN (NITROSTAT) 0.4 MG SL tablet Place 1 tablet (0.4 mg total) under the tongue every 5 (five) minutes as needed for chest pain. 25 tablet 6     Objective:  BP 110/70 (BP Location: Left Arm,  Patient Position: Sitting, Cuff Size: Large)   Pulse 78   Temp 98.2 F (36.8 C) (Oral)   Ht 5' 1"  (1.549 m)   Wt 193 lb 6.4 oz (87.7 kg)   SpO2 98%   BMI 36.54 kg/m  Gen: NAD, resting comfortably CV: RRR no rubs or gallops. Faint murmur noted today Lungs: CTAB no crackles, wheeze, rhonchi Abdomen: soft/nontender/nondistended/normal bowel sounds. Ext: trace edema Skin: warm, dry MSK: greater strength in right hand- still pain noted at wrist. Still with difficulty raising from chair but does better than last visit. Good ROM of shoulders without pain   Diabetic Foot Exam - Simple   Simple Foot Form Diabetic Foot exam was performed with the following findings:  Yes 03/16/2018  8:07 AM  Visual Inspection No deformities, no ulcerations, no other skin breakdown bilaterally:  Yes Sensation Testing See comments:  Yes Pulse Check Posterior Tibialis and Dorsalis pulse intact bilaterally:  Yes Comments No sensation bottom of left foot       Assessment and Plan    Myalgias- concern PMR Elevated ESR Hyperlipidemia- concern of repatha side effect S: Patient was seen 13 days ago with myalgias which she believed was related to her fourth dose of Repatha.  She complained of diffuse myalgias as well as diffuse weakness-including trouble closing her right hand.  Her hydrocodone which was supposed to be for her knees was helpful for pain.  She had already discontinued the Repatha 8 days prior but we discussed half-life was 11 to 17 days.  CK was not elevated-low risk for rhabdomyolysis.  CRP was not elevated but ESR was elevated to 66.  As far as her hand weakness-this seem to be related to pain in the wrist. Patient has upcoming knee surgery.  Her A1c has been well controlled at 6.0.  We discussed starting prednisone but she wanted to sit down to discuss.  Today, states still very difficult to get going in the morning primarily due to pain and weakness- continues to have pain during the day and  weakness but can get going at least.  More difficult to open bottles.   Thinks 25% better since last visit even without prednisone.   Taking her hydrocodone about once a day right now - last week was doing a lot of tylenol. Peak time of discomfort is around noon to 2 PM.   Surgery scheduled 9th on march- really wants to avoid prednisone if going to be having surgery.  A/P: I am concerned about potential PMR. Patient still is concerned about repatha as cause and wants to hold off on prednisone (as she would not want to go into surgery on prednisone) unless ESR remains elevated. She also  wants to see how LDL looks  Other notes: 1.first time I have heard faint murmur today- if persistent consider echocardiogram in future     No problem-specific Assessment & Plan notes found for this encounter.   Future Appointments  Date Time Provider [place]  03/19/2018  8:00 AM [facility]  03/23/2018  9:20 AM Marin Olp, MD [facility]  03/25/2018 11:00 AM [facility] PAT 3 [facility] None   No follow-ups on file.  Lab/Order associations: Elevated sed rate (elev SR) - Plan: Sedimentation rate  Hyperlipidemia associated with type 2 diabetes mellitus ([place]) - Plan: LDL cholesterol, direct  Meds ordered this encounter  Medications  . DISCONTD: HYDROcodone-acetaminophen (NORCO/VICODIN) 5-325 MG tablet    Sig: Take 1 tablet by mouth every 6 (six) hours as needed (may refill on 03/22/2018).    Dispense:  30 tablet    Refill:  0    OA of the knees-Chronic pain  . HYDROcodone-acetaminophen (NORCO/VICODIN) 5-325 MG tablet    Sig: Take 1 tablet by mouth every 6 (six) hours as needed (may refill on 03/22/2018).    Dispense:  30 tablet    Refill:  0    OA of the knees-Chronic pain  had to resend to local pharmacy  Time Stamp The duration of face-to-face time during this visit was greater than 25 minutes. Greater than 50% of this time was spent in counseling,  explanation of diagnosis, planning of further management, and/or coordination of care including discussing prednisone side effects, patient tearful about potentially delaying surgery, discussing concerns about repatha.     Return precautions advised.  Garret Reddish, MD

## 2018-03-16 NOTE — Telephone Encounter (Signed)
Patient has appointment today with Dr. Yong Channel will address then.

## 2018-03-17 ENCOUNTER — Encounter: Payer: Self-pay | Admitting: Family Medicine

## 2018-03-17 ENCOUNTER — Other Ambulatory Visit: Payer: Self-pay | Admitting: *Deleted

## 2018-03-17 MED ORDER — PREDNISONE 5 MG PO TABS: 15.0000 mg | ORAL_TABLET | Freq: Every day | ORAL | 5 refills | Status: DC

## 2018-03-17 NOTE — Telephone Encounter (Signed)
Copied from [place] [id number]. Topic: General - Other >> Mar 17, 2018 10:27 AM Sheran Luz wrote: Reason for CRM: Patient just calling to make sure MyChart message from 2/25 was received. Patient aware that Dr. Yong Channel is out of office today.

## 2018-03-18 ENCOUNTER — Encounter: Payer: Self-pay | Admitting: Family Medicine

## 2018-03-19 ENCOUNTER — Other Ambulatory Visit: Payer: Medicare HMO

## 2018-03-23 ENCOUNTER — Ambulatory Visit: Payer: Medicare HMO | Admitting: Family Medicine

## 2018-03-23 ENCOUNTER — Encounter: Payer: Self-pay | Admitting: Family Medicine

## 2018-03-23 DIAGNOSIS — M25531 Pain in right wrist: Secondary | ICD-10-CM | POA: Diagnosis not present

## 2018-03-25 ENCOUNTER — Encounter (HOSPITAL_COMMUNITY): Payer: Medicare HMO

## 2018-03-25 NOTE — Telephone Encounter (Signed)
Dr. Yong Channel,  Pt agrees, FYI.

## 2018-03-26 DIAGNOSIS — M25531 Pain in right wrist: Secondary | ICD-10-CM | POA: Diagnosis not present

## 2018-03-30 ENCOUNTER — Ambulatory Visit: Admit: 2018-03-30 | Payer: Medicare HMO | Admitting: Orthopedic Surgery

## 2018-03-30 ENCOUNTER — Encounter: Payer: Self-pay | Admitting: Family Medicine

## 2018-03-30 ENCOUNTER — Other Ambulatory Visit: Payer: Self-pay | Admitting: Family Medicine

## 2018-03-30 SURGERY — ARTHROPLASTY, KNEE, TOTAL
Anesthesia: Choice | Laterality: Left

## 2018-04-01 ENCOUNTER — Other Ambulatory Visit: Payer: Self-pay

## 2018-04-01 ENCOUNTER — Observation Stay (HOSPITAL_COMMUNITY)
Admission: EM | Admit: 2018-04-01 | Discharge: 2018-04-02 | Disposition: A | Discharge status: Home / Self Care | Payer: Medicare HMO | Attending: Internal Medicine | Admitting: Internal Medicine

## 2018-04-01 ENCOUNTER — Encounter (HOSPITAL_COMMUNITY): Payer: Self-pay

## 2018-04-01 ENCOUNTER — Emergency Department (HOSPITAL_COMMUNITY): Payer: Medicare HMO

## 2018-04-01 DIAGNOSIS — M353 Polymyalgia rheumatica: Secondary | ICD-10-CM | POA: Insufficient documentation

## 2018-04-01 DIAGNOSIS — Z885 Allergy status to narcotic agent status: Secondary | ICD-10-CM | POA: Diagnosis not present

## 2018-04-01 DIAGNOSIS — I447 Left bundle-branch block, unspecified: Secondary | ICD-10-CM | POA: Insufficient documentation

## 2018-04-01 DIAGNOSIS — R531 Weakness: Secondary | ICD-10-CM | POA: Diagnosis not present

## 2018-04-01 DIAGNOSIS — E1159 Type 2 diabetes mellitus with other circulatory complications: Secondary | ICD-10-CM | POA: Diagnosis present

## 2018-04-01 DIAGNOSIS — Z9884 Bariatric surgery status: Secondary | ICD-10-CM | POA: Diagnosis not present

## 2018-04-01 DIAGNOSIS — Z7982 Long term (current) use of aspirin: Secondary | ICD-10-CM | POA: Insufficient documentation

## 2018-04-01 DIAGNOSIS — R079 Chest pain, unspecified: Secondary | ICD-10-CM

## 2018-04-01 DIAGNOSIS — Z90710 Acquired absence of both cervix and uterus: Secondary | ICD-10-CM | POA: Diagnosis not present

## 2018-04-01 DIAGNOSIS — D72829 Elevated white blood cell count, unspecified: Secondary | ICD-10-CM | POA: Diagnosis not present

## 2018-04-01 DIAGNOSIS — E7800 Pure hypercholesterolemia, unspecified: Secondary | ICD-10-CM

## 2018-04-01 DIAGNOSIS — M1990 Unspecified osteoarthritis, unspecified site: Secondary | ICD-10-CM | POA: Insufficient documentation

## 2018-04-01 DIAGNOSIS — I10 Essential (primary) hypertension: Secondary | ICD-10-CM | POA: Diagnosis not present

## 2018-04-01 DIAGNOSIS — E039 Hypothyroidism, unspecified: Secondary | ICD-10-CM | POA: Diagnosis not present

## 2018-04-01 DIAGNOSIS — I25119 Atherosclerotic heart disease of native coronary artery with unspecified angina pectoris: Principal | ICD-10-CM | POA: Insufficient documentation

## 2018-04-01 DIAGNOSIS — Z6836 Body mass index (BMI) 36.0-36.9, adult: Secondary | ICD-10-CM | POA: Diagnosis not present

## 2018-04-01 DIAGNOSIS — Z79890 Hormone replacement therapy (postmenopausal): Secondary | ICD-10-CM | POA: Insufficient documentation

## 2018-04-01 DIAGNOSIS — Z888 Allergy status to other drugs, medicaments and biological substances status: Secondary | ICD-10-CM | POA: Diagnosis not present

## 2018-04-01 DIAGNOSIS — I152 Hypertension secondary to endocrine disorders: Secondary | ICD-10-CM | POA: Diagnosis present

## 2018-04-01 DIAGNOSIS — Z79899 Other long term (current) drug therapy: Secondary | ICD-10-CM | POA: Insufficient documentation

## 2018-04-01 DIAGNOSIS — Z7952 Long term (current) use of systemic steroids: Secondary | ICD-10-CM | POA: Diagnosis not present

## 2018-04-01 DIAGNOSIS — J45909 Unspecified asthma, uncomplicated: Secondary | ICD-10-CM | POA: Diagnosis not present

## 2018-04-01 DIAGNOSIS — Z823 Family history of stroke: Secondary | ICD-10-CM | POA: Diagnosis not present

## 2018-04-01 DIAGNOSIS — Z881 Allergy status to other antibiotic agents status: Secondary | ICD-10-CM | POA: Diagnosis not present

## 2018-04-01 DIAGNOSIS — E119 Type 2 diabetes mellitus without complications: Secondary | ICD-10-CM | POA: Diagnosis not present

## 2018-04-01 DIAGNOSIS — Z87891 Personal history of nicotine dependence: Secondary | ICD-10-CM | POA: Insufficient documentation

## 2018-04-01 DIAGNOSIS — R0789 Other chest pain: Secondary | ICD-10-CM | POA: Diagnosis present

## 2018-04-01 LAB — BASIC METABOLIC PANEL
Anion gap: 9 (ref 5–15)
BUN: 13 mg/dL (ref 8–23)
CALCIUM: 10.4 mg/dL — AB (ref 8.9–10.3)
CO2: 25 mmol/L (ref 22–32)
Chloride: 101 mmol/L (ref 98–111)
Creatinine, Ser: 0.86 mg/dL (ref 0.44–1.00)
GFR calc Af Amer: >60 mL/min (ref >60)
GFR calc non Af Amer: >60 mL/min (ref >60)
Glucose, Bld: 148 mg/dL — ABNORMAL HIGH (ref 70–99)
Potassium: 4.3 mmol/L (ref 3.5–5.1)
Sodium: 135 mmol/L (ref 135–145)

## 2018-04-01 LAB — CBC
HCT: 41.2 % (ref 36.0–46.0)
Hemoglobin: 13.2 g/dL (ref 12.0–15.0)
MCH: 28.8 pg (ref 26.0–34.0)
MCHC: 32 g/dL (ref 30.0–36.0)
MCV: 90 fL (ref 80.0–100.0)
PLATELETS: 279 10*3/uL (ref 150–400)
RBC: 4.58 MIL/uL (ref 3.87–5.11)
RDW: 14 % (ref 11.5–15.5)
WBC: 13.2 10*3/uL — ABNORMAL HIGH (ref 4.0–10.5)
nRBC: 0 % (ref 0.0–0.2)

## 2018-04-01 LAB — I-STAT TROPONIN, ED: Troponin i, poc: 0 ng/mL (ref 0.00–0.08)

## 2018-04-01 MED ORDER — SODIUM CHLORIDE 0.9% FLUSH
3.0000 mL | Freq: Once | INTRAVENOUS | Status: AC
  Administered 2018-04-02: 3 mL via INTRAVENOUS

## 2018-04-01 NOTE — ED Triage Notes (Signed)
Pt bib ems for chest pain that started while walking in her house and radiated to her right jaw. Pt took 324 ASA and 2 Nitros with relief of pain. Pt a.o, nad noted.

## 2018-04-02 ENCOUNTER — Encounter (HOSPITAL_COMMUNITY): Admission: EM | Disposition: A | Discharge status: Home / Self Care | Payer: Self-pay | Source: Home / Self Care

## 2018-04-02 ENCOUNTER — Encounter (HOSPITAL_COMMUNITY): Payer: Self-pay | Admitting: Internal Medicine

## 2018-04-02 ENCOUNTER — Other Ambulatory Visit: Payer: Self-pay

## 2018-04-02 DIAGNOSIS — E039 Hypothyroidism, unspecified: Secondary | ICD-10-CM

## 2018-04-02 DIAGNOSIS — E7800 Pure hypercholesterolemia, unspecified: Secondary | ICD-10-CM

## 2018-04-02 DIAGNOSIS — R079 Chest pain, unspecified: Secondary | ICD-10-CM | POA: Diagnosis not present

## 2018-04-02 DIAGNOSIS — I2510 Atherosclerotic heart disease of native coronary artery without angina pectoris: Secondary | ICD-10-CM | POA: Diagnosis not present

## 2018-04-02 DIAGNOSIS — E119 Type 2 diabetes mellitus without complications: Secondary | ICD-10-CM

## 2018-04-02 DIAGNOSIS — Z9884 Bariatric surgery status: Secondary | ICD-10-CM

## 2018-04-02 DIAGNOSIS — I10 Essential (primary) hypertension: Secondary | ICD-10-CM | POA: Diagnosis not present

## 2018-04-02 HISTORY — PX: LEFT HEART CATH AND CORONARY ANGIOGRAPHY: CATH118249

## 2018-04-02 LAB — GLUCOSE, CAPILLARY
GLUCOSE-CAPILLARY: 96 mg/dL (ref 70–99)
Glucose-Capillary: 92 mg/dL (ref 70–99)
Glucose-Capillary: 135 mg/dL — ABNORMAL HIGH (ref 70–99)

## 2018-04-02 LAB — TROPONIN I
Troponin I: <0.03 ng/mL (ref <0.03)
Troponin I: <0.03 ng/mL (ref <0.03)

## 2018-04-02 SURGERY — LEFT HEART CATH AND CORONARY ANGIOGRAPHY
Anesthesia: LOCAL

## 2018-04-02 MED ORDER — MIDAZOLAM HCL 2 MG/2ML IJ SOLN
INTRAMUSCULAR | Status: DC | PRN
  Administered 2018-04-02: 1 mg via INTRAVENOUS

## 2018-04-02 MED ORDER — SODIUM CHLORIDE 0.9 % IV SOLN: 250.0000 mL | INTRAVENOUS | Status: DC | PRN

## 2018-04-02 MED ORDER — SODIUM CHLORIDE 0.9 % WEIGHT BASED INFUSION
3.0000 mL/kg/h | INTRAVENOUS | Status: AC
  Administered 2018-04-02: 3 mL/kg/h via INTRAVENOUS

## 2018-04-02 MED ORDER — DESVENLAFAXINE SUCCINATE ER 100 MG PO TB24
100.0000 mg | ORAL_TABLET | Freq: Every day | ORAL | Status: DC
  Administered 2018-04-02: 100 mg via ORAL
  Filled 2018-04-02: qty 1

## 2018-04-02 MED ORDER — VERAPAMIL HCL 2.5 MG/ML IV SOLN
INTRAVENOUS | Status: AC
  Filled 2018-04-02: qty 2

## 2018-04-02 MED ORDER — HEPARIN (PORCINE) IN NACL 1000-0.9 UT/500ML-% IV SOLN
INTRAVENOUS | Status: DC | PRN
  Administered 2018-04-02: 500 mL

## 2018-04-02 MED ORDER — SODIUM CHLORIDE 0.9 % WEIGHT BASED INFUSION
3.0000 mL/kg/h | INTRAVENOUS | Status: DC
  Administered 2018-04-02: 3 mL/kg/h via INTRAVENOUS

## 2018-04-02 MED ORDER — FENTANYL CITRATE (PF) 100 MCG/2ML IJ SOLN
INTRAMUSCULAR | Status: DC | PRN
  Administered 2018-04-02: 25 ug via INTRAVENOUS

## 2018-04-02 MED ORDER — MIDAZOLAM HCL 2 MG/2ML IJ SOLN
INTRAMUSCULAR | Status: AC
  Filled 2018-04-02: qty 2

## 2018-04-02 MED ORDER — LORAZEPAM 0.5 MG PO TABS: 0.2500 mg | ORAL_TABLET | Freq: Every day | ORAL | Status: DC | PRN

## 2018-04-02 MED ORDER — HEPARIN (PORCINE) IN NACL 1000-0.9 UT/500ML-% IV SOLN
INTRAVENOUS | Status: AC
  Filled 2018-04-02: qty 500

## 2018-04-02 MED ORDER — SODIUM CHLORIDE 0.9% FLUSH: 3.0000 mL | INTRAVENOUS | Status: DC | PRN

## 2018-04-02 MED ORDER — HYDROCODONE-ACETAMINOPHEN 5-325 MG PO TABS: 1.0000 | ORAL_TABLET | Freq: Four times a day (QID) | ORAL | Status: DC | PRN

## 2018-04-02 MED ORDER — SODIUM CHLORIDE 0.9% FLUSH: 3.0000 mL | Freq: Two times a day (BID) | INTRAVENOUS | Status: DC

## 2018-04-02 MED ORDER — ALBUTEROL SULFATE (2.5 MG/3ML) 0.083% IN NEBU: 3.0000 mL | INHALATION_SOLUTION | RESPIRATORY_TRACT | Status: DC | PRN

## 2018-04-02 MED ORDER — SODIUM CHLORIDE 0.9 % WEIGHT BASED INFUSION: 1.0000 mL/kg/h | INTRAVENOUS | Status: DC

## 2018-04-02 MED ORDER — VERAPAMIL HCL 2.5 MG/ML IV SOLN
INTRAVENOUS | Status: DC | PRN
  Administered 2018-04-02: 10 mL via INTRA_ARTERIAL

## 2018-04-02 MED ORDER — ENOXAPARIN SODIUM 40 MG/0.4ML ~~LOC~~ SOLN: 40.0000 mg | SUBCUTANEOUS | Status: DC

## 2018-04-02 MED ORDER — ACETAMINOPHEN 325 MG PO TABS
650.0000 mg | ORAL_TABLET | Freq: Once | ORAL | Status: AC
  Administered 2018-04-02: 650 mg via ORAL
  Filled 2018-04-02: qty 2

## 2018-04-02 MED ORDER — AMLODIPINE BESYLATE 10 MG PO TABS: 10.0000 mg | ORAL_TABLET | Freq: Every evening | ORAL | Status: DC

## 2018-04-02 MED ORDER — HEPARIN SODIUM (PORCINE) 1000 UNIT/ML IJ SOLN
INTRAMUSCULAR | Status: DC | PRN
  Administered 2018-04-02: 4500 [IU] via INTRAVENOUS

## 2018-04-02 MED ORDER — ATORVASTATIN CALCIUM 10 MG PO TABS: 20.0000 mg | ORAL_TABLET | ORAL | Status: DC

## 2018-04-02 MED ORDER — HEPARIN (PORCINE) IN NACL 1000-0.9 UT/500ML-% IV SOLN
INTRAVENOUS | Status: AC
  Filled 2018-04-02: qty 1000

## 2018-04-02 MED ORDER — HEPARIN (PORCINE) IN NACL 1000-0.9 UT/500ML-% IV SOLN
INTRAVENOUS | Status: DC | PRN
  Administered 2018-04-02 (×2): 500 mL

## 2018-04-02 MED ORDER — LEVOTHYROXINE SODIUM 75 MCG PO TABS: 150.0000 ug | ORAL_TABLET | Freq: Once | ORAL | Status: DC

## 2018-04-02 MED ORDER — ASPIRIN 81 MG PO CHEW: 81.0000 mg | CHEWABLE_TABLET | ORAL | Status: DC

## 2018-04-02 MED ORDER — TAB-A-VITE/IRON PO TABS
1.0000 | ORAL_TABLET | Freq: Two times a day (BID) | ORAL | Status: DC
  Administered 2018-04-02: 1 via ORAL
  Filled 2018-04-02: qty 1

## 2018-04-02 MED ORDER — NITROGLYCERIN 0.4 MG SL SUBL: 0.4000 mg | SUBLINGUAL_TABLET | SUBLINGUAL | Status: DC | PRN

## 2018-04-02 MED ORDER — ONDANSETRON HCL 4 MG/2ML IJ SOLN: 4.0000 mg | Freq: Four times a day (QID) | INTRAMUSCULAR | Status: DC | PRN

## 2018-04-02 MED ORDER — MONTELUKAST SODIUM 10 MG PO TABS
10.0000 mg | ORAL_TABLET | Freq: Every day | ORAL | Status: DC
  Administered 2018-04-02: 10 mg via ORAL
  Filled 2018-04-02: qty 1

## 2018-04-02 MED ORDER — ONDANSETRON HCL 4 MG PO TABS: 4.0000 mg | ORAL_TABLET | Freq: Four times a day (QID) | ORAL | Status: DC | PRN

## 2018-04-02 MED ORDER — SODIUM CHLORIDE 0.9 % WEIGHT BASED INFUSION: 1.0000 mL/kg/h | INTRAVENOUS | Status: AC

## 2018-04-02 MED ORDER — LIDOCAINE HCL (PF) 1 % IJ SOLN
INTRAMUSCULAR | Status: DC | PRN
  Administered 2018-04-02: 2 mL via INTRADERMAL

## 2018-04-02 MED ORDER — ASPIRIN 81 MG PO CHEW
81.0000 mg | CHEWABLE_TABLET | Freq: Every day | ORAL | Status: DC
  Administered 2018-04-02: 81 mg via ORAL
  Filled 2018-04-02: qty 1

## 2018-04-02 MED ORDER — OMEGA-3-ACID ETHYL ESTERS 1 G PO CAPS
1.0000 g | ORAL_CAPSULE | Freq: Every day | ORAL | Status: DC
  Administered 2018-04-02: 1 g via ORAL
  Filled 2018-04-02: qty 1

## 2018-04-02 MED ORDER — LEVOTHYROXINE SODIUM 100 MCG PO TABS: 300.0000 ug | ORAL_TABLET | Freq: Once | ORAL | Status: DC

## 2018-04-02 MED ORDER — FENTANYL CITRATE (PF) 100 MCG/2ML IJ SOLN
INTRAMUSCULAR | Status: AC
  Filled 2018-04-02: qty 2

## 2018-04-02 MED ORDER — FLUTICASONE PROPIONATE 50 MCG/ACT NA SUSP
1.0000 | Freq: Every day | NASAL | Status: DC
  Administered 2018-04-02: 1 via NASAL
  Filled 2018-04-02: qty 16

## 2018-04-02 MED ORDER — PREDNISONE 5 MG PO TABS
15.0000 mg | ORAL_TABLET | Freq: Every day | ORAL | Status: DC
  Administered 2018-04-02: 15 mg via ORAL
  Filled 2018-04-02: qty 1

## 2018-04-02 MED ORDER — LEVOTHYROXINE SODIUM 100 MCG PO TABS
300.0000 ug | ORAL_TABLET | Freq: Once | ORAL | Status: AC
  Administered 2018-04-02: 300 ug via ORAL
  Filled 2018-04-02: qty 3

## 2018-04-02 MED ORDER — INSULIN ASPART 100 UNIT/ML ~~LOC~~ SOLN: 0.0000 [IU] | SUBCUTANEOUS | Status: DC

## 2018-04-02 MED ORDER — LIDOCAINE HCL (PF) 1 % IJ SOLN
INTRAMUSCULAR | Status: AC
  Filled 2018-04-02: qty 30

## 2018-04-02 MED ORDER — LEVOTHYROXINE SODIUM 100 MCG PO TABS: 300.0000 ug | ORAL_TABLET | Freq: Every day | ORAL | Status: DC

## 2018-04-02 MED ORDER — ACETAMINOPHEN 650 MG RE SUPP: 650.0000 mg | Freq: Four times a day (QID) | RECTAL | Status: DC | PRN

## 2018-04-02 MED ORDER — IOHEXOL 350 MG/ML SOLN
INTRAVENOUS | Status: DC | PRN
  Administered 2018-04-02: 60 mL via INTRA_ARTERIAL

## 2018-04-02 MED ORDER — ACETAMINOPHEN 325 MG PO TABS: 650.0000 mg | ORAL_TABLET | Freq: Four times a day (QID) | ORAL | Status: DC | PRN

## 2018-04-02 SURGICAL SUPPLY — 10 items
CATH 5FR JL3.5 JR4 ANG PIG MP (CATHETERS) ×2 IMPLANT
DEVICE RAD COMP TR BAND LRG (VASCULAR PRODUCTS) ×2 IMPLANT
GLIDESHEATH SLEND SS 6F .021 (SHEATH) ×2 IMPLANT
GUIDEWIRE INQWIRE 1.5J.035X260 (WIRE) ×1 IMPLANT
INQWIRE 1.5J .035X260CM (WIRE) ×2
KIT HEART LEFT (KITS) ×2 IMPLANT
PACK CARDIAC CATHETERIZATION (CUSTOM PROCEDURE TRAY) ×2 IMPLANT
SHEATH PROBE COVER 6X72 (BAG) ×2 IMPLANT
TRANSDUCER W/STOPCOCK (MISCELLANEOUS) ×2 IMPLANT
TUBING CIL FLEX 10 FLL-RA (TUBING) ×2 IMPLANT

## 2018-04-02 NOTE — ED Notes (Addendum)
Pt came to desk asking about wait time. Explained to pt that I couldn't give her a wait time but I could tell her how many people are ahead of her. Informed pt that there are 8 people ahead of her to go back. Pt expressed that she is a cardiac pt and that she shouldn't have to wait and would like to talk to an RN so that she can request to be seen. Explained to pt that she was triaged, blood work was done, EKG was done, Chest x-ray was done and that if anything came back abnormal we would get a phone call. Explained to pt that the 8 people ahead of her have been here longer than her and that I can't skip over the other pt's that have been waiting.

## 2018-04-02 NOTE — Discharge Instructions (Signed)
Radial Site Care ° °This sheet gives you information about how to care for yourself after your procedure. Your health care provider may also give you more specific instructions. If you have problems or questions, contact your health care provider. °What can I expect after the procedure? °After the procedure, it is common to have: °· Bruising and tenderness at the catheter insertion area. °Follow these instructions at home: °Medicines °· Take over-the-counter and prescription medicines only as told by your health care provider. °Insertion site care °· Follow instructions from your health care provider about how to take care of your insertion site. Make sure you: °? Wash your hands with soap and water before you change your bandage (dressing). If soap and water are not available, use hand sanitizer. °? Change your dressing as told by your health care provider. °? Leave stitches (sutures), skin glue, or adhesive strips in place. These skin closures may need to stay in place for 2 weeks or longer. If adhesive strip edges start to loosen and curl up, you may trim the loose edges. Do not remove adhesive strips completely unless your health care provider tells you to do that. °· Check your insertion site every day for signs of infection. Check for: °? Redness, swelling, or pain. °? Fluid or blood. °? Pus or a bad smell. °? Warmth. °· Do not take baths, swim, or use a hot tub until your health care provider approves. °· You may shower 24-48 hours after the procedure, or as directed by your health care provider. °? Remove the dressing and gently wash the site with plain soap and water. °? Pat the area dry with a clean towel. °? Do not rub the site. That could cause bleeding. °· Do not apply powder or lotion to the site. °Activity ° °· For 24 hours after the procedure, or as directed by your health care provider: °? Do not flex or bend the affected arm. °? Do not push or pull heavy objects with the affected arm. °? Do not  drive yourself home from the hospital or clinic. You may drive 24 hours after the procedure unless your health care provider tells you not to. °? Do not operate machinery or power tools. °· Do not lift anything that is heavier than 10 lb (4.5 kg), or the limit that you are told, until your health care provider says that it is safe. °· Ask your health care provider when it is okay to: °? Return to work or school. °? Resume usual physical activities or sports. °? Resume sexual activity. °General instructions °· If the catheter site starts to bleed, raise your arm and put firm pressure on the site. If the bleeding does not stop, get help right away. This is a medical emergency. °· If you went home on the same day as your procedure, a responsible adult should be with you for the first 24 hours after you arrive home. °· Keep all follow-up visits as told by your health care provider. This is important. °Contact a health care provider if: °· You have a fever. °· You have redness, swelling, or yellow drainage around your insertion site. °Get help right away if: °· You have unusual pain at the radial site. °· The catheter insertion area swells very fast. °· The insertion area is bleeding, and the bleeding does not stop when you hold steady pressure on the area. °· Your arm or hand becomes pale, cool, tingly, or numb. °These symptoms may represent a serious problem   that is an emergency. Do not wait to see if the symptoms will go away. Get medical help right away. Call your local emergency services (911 in the [place] Do not drive yourself to the hospital. °Summary °· After the procedure, it is common to have bruising and tenderness at the site. °· Follow instructions from your health care provider about how to take care of your radial site wound. Check the wound every day for signs of infection. °· Do not lift anything that is heavier than 10 lb (4.5 kg), or the limit that you are told, until your health care provider says  that it is safe. °This information is not intended to replace advice given to you by your health care provider. Make sure you discuss any questions you have with your health care provider. °Document Released: 02/09/2010 Document Revised: 02/12/2017 Document Reviewed: 02/12/2017 °Elsevier Interactive Patient Education © 2019 [employer] ° ° ° °Moderate Conscious Sedation, Adult, Care After °These instructions provide you with information about caring for yourself after your procedure. Your health care provider may also give you more specific instructions. Your treatment has been planned according to current medical practices, but problems sometimes occur. Call your health care provider if you have any problems or questions after your procedure. °What can I expect after the procedure? °After your procedure, it is common: °· To feel sleepy for several hours. °· To feel clumsy and have poor balance for several hours. °· To have poor judgment for several hours. °· To vomit if you eat too soon. °Follow these instructions at home: °For at least 24 hours after the procedure: ° °· Do not: °? Participate in activities where you could fall or become injured. °? Drive. °? Use heavy machinery. °? Drink alcohol. °? Take sleeping pills or medicines that cause drowsiness. °? Make important decisions or sign legal documents. °? Take care of children on your own. °· Rest. °Eating and drinking °· Follow the diet recommended by your health care provider. °· If you vomit: °? Drink water, juice, or soup when you can drink without vomiting. °? Make sure you have little or no nausea before eating solid foods. °General instructions °· Have a responsible adult stay with you until you are awake and alert. °· Take over-the-counter and prescription medicines only as told by your health care provider. °· If you smoke, do not smoke without supervision. °· Keep all follow-up visits as told by your health care provider. This is  important. °Contact a health care provider if: °· You keep feeling nauseous or you keep vomiting. °· You feel light-headed. °· You develop a rash. °· You have a fever. °Get help right away if: °· You have trouble breathing. °This information is not intended to replace advice given to you by your health care provider. Make sure you discuss any questions you have with your health care provider. °Document Released: 10/28/2012 Document Revised: 06/12/2015 Document Reviewed: 04/29/2015 °Elsevier Interactive Patient Education © 2019 [employer] ° °

## 2018-04-02 NOTE — Progress Notes (Signed)
Patient transferred to Cath lab.

## 2018-04-02 NOTE — ED Provider Notes (Signed)
[facility] EMERGENCY DEPARTMENT Provider Note   CSN: [id number] Arrival date & time: 04/01/18  1912    History   Chief Complaint Chief Complaint  Patient presents with   Chest Pain    HPI [name] is a 78 y.o. female.     The history is provided by the patient and a relative.  Chest Pain  Pain location:  Substernal area Pain quality: pressure   Pain radiates to:  R jaw Pain severity:  Moderate Onset quality:  Sudden Duration:  15 minutes Timing:  Constant Progression:  Resolved Chronicity:  New Relieved by:  Nitroglycerin Worsened by:  Nothing Associated symptoms: weakness   Associated symptoms: no diaphoresis, no shortness of breath and no vomiting   Risk factors: diabetes mellitus and hypertension   PT Presents for chest pain.  She reports she had onset of chest pain that radiated to her jaw at rest.  She took 2 nitroglycerin and it relieved her pain.  She reports associated weakness with the pain.  She reports recent issues with arthritis, but otherwise has been well.  No recent flulike illness   Past Medical History:  Diagnosis Date   Anemia    Arthritis    osteoarthritis - shoulder, knees & hips   Asthma    Depression    Diabetes mellitus without complication ([place])    no meds   Hypertension    Dr. Orinda Kenner manages BP, pt. reports MD has not found a need for treatment    Hypothyroidism    Sleep difficulties    had sleep study -2009, prior to gastric surgery, told that there was not a need for f/u   Thyroid disease    Vitamin B 12 deficiency     Patient Active Problem List   Diagnosis Date Noted   Chest pain 04/02/2018   CAD (coronary artery disease) 11/19/2017   Anxiety 07/17/2017   Osteopenia of left hip 07/17/2017   Overactive bladder 09/03/2016   Morbid obesity ([place]) 06/28/2015   Hypercalcemia 12/29/2014   Allergic rhinitis 07/20/2014   History of gastric bypass 02/08/2014   Osteoarthritis  10/05/2013   Hyperlipidemia associated with type 2 diabetes mellitus ([place]) 01/05/2008   CHEST WALL PAIN, ANTERIOR 10/09/2007   Hypothyroidism 09/09/2007   Well controlled type 2 diabetes mellitus ([place]) 07/18/2006   Major depression, recurrent, full remission ([place]) 07/18/2006   Essential hypertension 07/18/2006   ASTHMA 07/18/2006    Past Surgical History:  Procedure Laterality Date   ABDOMINAL HYSTERECTOMY     BARIATRIC [place]     [facility]- 30 yrs. ago   CATARACT EXTRACTION, BILATERAL     late 2018   OOPHORECTOMY     pantallor arthrodesis with rod placement left foot     TONSILLECTOMY     TOTAL SHOULDER ARTHROPLASTY Left 03/12/2012   Procedure: TOTAL SHOULDER ARTHROPLASTY;  Surgeon: Marin Shutter, MD;  Location: [place];  Service: Orthopedics;  Laterality: Left;     OB History   No obstetric history on file.      Home Medications    Prior to Admission medications   Medication Sig Start Date End Date Taking? Authorizing Provider  acetaminophen (TYLENOL) 500 MG tablet Take 1,000 mg by mouth every 8 (eight) hours as needed for mild pain or headache.   Yes [provider]  albuterol (PROVENTIL HFA;VENTOLIN HFA) 108 (90 Base) MCG/ACT inhaler Inhale 1-2 puffs into the lungs every 4 (four) hours as needed  for wheezing or shortness of breath. 02/15/15  Yes Marin Olp, MD  Alcohol Swabs PADS Use to check blood sugar once a day 05/27/17  Yes Marin Olp, MD  amLODipine (NORVASC) 10 MG tablet Take 10 mg by mouth every evening.   Yes [provider]  aspirin 81 MG tablet Take 81 mg by mouth daily.   Yes [provider]  atorvastatin (LIPITOR) 20 MG tablet TAKE 1 TABLET ONE TIME WEEKLY Patient taking differently: Take 20 mg by mouth every Friday.  02/25/18  Yes Marin Olp, MD  Blood Glucose Calibration (TRUE METRIX LEVEL 2) Normal SOLN Use to calibrate meter as needed 05/27/17  Yes Marin Olp, MD  Blood  Glucose Monitoring Suppl (TRUE METRIX AIR GLUCOSE METER) w/Device KIT USE AS DIRECTED 03/31/18  Yes Marin Olp, MD  Cholecalciferol (VITAMIN D) 1000 UNITS capsule Take 5,000 Units by mouth daily.    Yes [provider]  cyanocobalamin (,VITAMIN B-12,) 1000 MCG/ML injection Inject 1 mL (1,000 mcg total) into the muscle every 30 (thirty) days. Uses on amonthly basis 09/03/16  Yes Marin Olp, MD  desvenlafaxine (PRISTIQ) 100 MG 24 hr tablet TAKE 1 TABLET EVERY DAY Patient taking differently: Take 100 mg by mouth daily.  02/25/18  Yes Marin Olp, MD  FeFum-FePo-FA-B Cmp-C-Zn-Mn-Cu (SE-TAN PLUS) 162-115.2-1 MG CAPS Take 1 capsule by mouth 2 (two) times daily. 04/11/17  Yes Marin Olp, MD  fluticasone (FLONASE) 50 MCG/ACT nasal spray USE 1 SPRAY IN EACH NOSTRIL EVERY DAY Patient taking differently: Place 1 spray into both nostrils daily.  09/23/17  Yes Marin Olp, MD  glucose blood test strip Test blood sugar once per day 05/27/17  Yes Marin Olp, MD  HYDROcodone-acetaminophen (NORCO/VICODIN) 5-325 MG tablet Take 1 tablet by mouth every 6 (six) hours as needed (may refill on 03/22/2018). Patient taking differently: Take 1 tablet by mouth every 6 (six) hours as needed for moderate pain (may refill on 03/22/2018).  03/16/18  Yes Marin Olp, MD  Levothyroxine Sodium (TIROSINT) 150 MCG CAPS Take 150-300 mcg by mouth every morning. Take 2 capsules by mouth for two days then do not take any on the third day; on the fourth and Fifth day take 2 capsules. On the sixth day to not take any. Seventh day take 2 capsules on the eight day take 1 capsule and the ninth day do not take any.  Start over after the ninth day. in morning on empty stomach for thyroid (name brand only DAW) 09/18/17  Yes [provider]  loperamide (IMODIUM) 2 MG capsule Take 4 mg by mouth as needed for diarrhea or loose stools.   Yes [provider]  LORazepam (ATIVAN) 0.5 MG tablet Take  0.5-1 tablets (0.25-0.5 mg total) by mouth daily as needed for anxiety (do not drive for 8 hours after taking). 07/16/17  Yes Marin Olp, MD  montelukast (SINGULAIR) 10 MG tablet Take 1 tablet (10 mg total) by mouth daily with breakfast. 04/11/17  Yes Marin Olp, MD  nitroGLYCERIN (NITROSTAT) 0.4 MG SL tablet Place 1 tablet (0.4 mg total) under the tongue every 5 (five) minutes as needed for chest pain. 09/29/17 04/02/18 Yes Belva Crome, MD  Omega-3 Fatty Acids (FISH OIL) 1000 MG CAPS Take 1,000 mg by mouth daily.    Yes [provider]  predniSONE (DELTASONE) 5 MG tablet Take 3 tablets (15 mg total) by mouth daily with breakfast. 03/17/18  Yes Hunter,  Brayton Mars, MD  Propylene Glycol (SYSTANE COMPLETE OP) Apply 1 drop to eye daily as needed (dryness).    Yes [provider]  TRUEPLUS LANCETS 30G MISC Check blood sugar once per day 05/27/17  Yes Marin Olp, MD  ipratropium (ATROVENT) 0.06 % nasal spray Place 2 sprays into both nostrils 4 (four) times daily. Patient not taking: Reported on 04/02/2018 02/13/18   Vivi Barrack, MD    Family History Family History  Problem Relation Age of Onset   Stroke Mother    Liver cancer Father    Cancer Father        liver   Breast cancer Neg Hx     Social History Social History   Tobacco Use   Smoking status: Former Smoker    Packs/day: 0.70    Years: 20.00    Pack years: 14.00    Last attempt to quit: 03/04/1978    Years since quitting: 40.1   Smokeless tobacco: Never Used  Substance Use Topics   Alcohol use: Yes    Alcohol/week: 2.0 standard drinks    Types: 2 Glasses of wine per week    Comment: with dinner  or social rare -    Drug use: No     Allergies   Flexeril [cyclobenzaprine]; Ace inhibitors; Azithromycin; Codeine; and Repatha [evolocumab]   Review of Systems Review of Systems  Constitutional: Negative for diaphoresis.  Respiratory: Negative for shortness of breath.     Cardiovascular: Positive for chest pain.  Gastrointestinal: Negative for vomiting.  Neurological: Positive for weakness.  All other systems reviewed and are negative.    Physical Exam Updated Vital Signs BP (!) 147/78    Pulse (!) 54    Temp 98.5 F (36.9 C) (Oral)    Resp 14    SpO2 94%   Physical Exam  CONSTITUTIONAL: Well developed/well nourished HEAD: Normocephalic/atraumatic EYES: EOMI ENMT: Mucous membranes moist NECK: supple no meningeal signs SPINE/BACK:entire spine nontender CV: S1/S2 noted, no murmurs/rubs/gallops noted LUNGS: Lungs are clear to auscultation bilaterally, no apparent distress ABDOMEN: soft, nontender, no rebound or guarding, bowel sounds noted throughout abdomen GU:no cva tenderness NEURO: Pt is awake/alert/appropriate, moves all extremitiesx4.  No facial droop.   EXTREMITIES: pulses normal/equal, full ROM, no calf tenderness SKIN: warm, color normal PSYCH: no abnormalities of mood noted, alert and oriented to situation   ED Treatments / Results  Labs (all labs ordered are listed, but only abnormal results are displayed) Labs Reviewed  BASIC METABOLIC PANEL - Abnormal; Notable for the following components:      Result Value   Glucose, Bld 148 (*)    Calcium 10.4 (*)    All other components within normal limits  CBC - Abnormal; Notable for the following components:   WBC 13.2 (*)    All other components within normal limits  I-STAT TROPONIN, ED    EKG  ED ECG REPORT   Date: 04/02/2018 0343am  Rate: 59  Rhythm: normal sinus rhythm  QRS Axis: normal  Intervals: normal  ST/T Wave abnormalities: nonspecific ST changes  Conduction Disutrbances:none  Narrative Interpretation:   Old EKG Reviewed: artifact limits evaluation I have personally reviewed the EKG tracing and agree with the computerized printout as noted.  Radiology Dg Chest 2 View  Result Date: 04/01/2018 CLINICAL DATA:  78 year old female with acute chest pain at 1800  hours. EXAM: CHEST - 2 VIEW COMPARISON:  Chest radiographs 07/07/2017 and earlier. FINDINGS: Chronic tortuosity of the thoracic aorta appears stable. Other  mediastinal contours are within normal limits. Lung volumes are within normal limits. Both lungs appear clear. No pneumothorax or pleural effusion. Visualized tracheal air column is within normal limits. Stable abdominal surgical clips. Negative visible bowel gas pattern. Partially visible spinal and left shoulder hardware. No acute osseous abnormality identified. IMPRESSION: No acute cardiopulmonary abnormality. Electronically Signed   By: Genevie Ann M.D.   On: 04/01/2018 20:05    Procedures Procedures    Medications Ordered in ED Medications  sodium chloride flush (NS) 0.9 % injection 3 mL (3 mLs Intravenous Given 04/02/18 0511)  acetaminophen (TYLENOL) tablet 650 mg (650 mg Oral Given 04/02/18 0506)     Initial Impression / Assessment and Plan / ED Course  I have reviewed the triage vital signs and the nursing notes.  Pertinent labs & imaging results that were available during my care of the patient were reviewed by me and considered in my medical decision making (see chart for details).       5:32 AM Pt with Multiple cardiac risk factors presents with chest pain.  When I evaluated patient she was chest pain-free.  She had Artie taken aspirin nitroglycerin.  Her chest pain story is concerning given that was onset at rest with associated weakness.  Plan for admission and patient is agreeable with plan.  Discussed with Dr Hal Hope for admission Low suspicion for PE/Dissection at this time Final Clinical Impressions(s) / ED Diagnoses   Final diagnoses:  Chest pain, rule out acute myocardial infarction    ED Discharge Orders    None       Ripley Fraise, MD 04/02/18 [phone]

## 2018-04-02 NOTE — Discharge Summary (Signed)
[name], is a 78 y.o. female  DOB 06-29-1940  MRN [id number].  Admission date:  04/01/2018  Admitting Physician  Rise Patience, MD  Discharge Date:  04/02/2018   Primary MD  Marin Olp, MD  Recommendations for primary care physician for things to follow:     Discharge Diagnosis   Principal Problem:   Chest pain with high risk for cardiac etiology Active Problems:   Hypothyroidism   Well controlled type 2 diabetes mellitus ([place])   Essential hypertension   History of gastric bypass   Morbid obesity ([place])   Hypercholesteremia      Past Medical History:  Diagnosis Date   Anemia    Arthritis    osteoarthritis - shoulder, knees & hips   Asthma    Depression    Diabetes mellitus without complication ([place])    no meds   Hypertension    Dr. Orinda Kenner manages BP, pt. reports MD has not found a need for treatment    Hypothyroidism    Sleep difficulties    had sleep study -2009, prior to gastric surgery, told that there was not a need for f/u   Thyroid disease    Vitamin B 12 deficiency     Past Surgical History:  Procedure Laterality Date   ABDOMINAL HYSTERECTOMY     BARIATRIC [place]     [facility]- 30 yrs. ago   CATARACT EXTRACTION, BILATERAL     late 2018   OOPHORECTOMY     pantallor arthrodesis with rod placement left foot     TONSILLECTOMY     TOTAL SHOULDER ARTHROPLASTY Left 03/12/2012   Procedure: TOTAL SHOULDER ARTHROPLASTY;  Surgeon: Marin Shutter, MD;  Location: [facility];  Service: Orthopedics;  Laterality: Left;       HPI  from the history and physical done on the day of admission:    [name] is a 78 y.o. female with history of recently diagnosed polymyalgia rheumatica on prednisone, gastric bypass surgery, diabetes mellitus  on diet, hypertension, hypothyroidism presents today after patient started having chest pain last evening at home.  Patient states she was walking in the kitchen when she started having substernal chest pressure nonradiating with no associated shortness of breath diaphoresis or palpitations.  Had no nausea or vomiting.  She took nitroglycerin but with no relief had to take another one.  Following which patient chest pain resolved and came to the ER.  ED Course: In the ER EKG was showing normal sinus rhythm with incomplete LBBB.  Chest x-ray unremarkable troponin negative admitted for further management of chest pain.  Patient has had a stress test in December 2019 which was low risk.     Hospital Course:   1. Chest pain -has had a low risk stress test in December 2019, but previously had a chest pain episode in June 2019 which noted calcium score of 990.  Patient has risk factors which gave concern for possible ischemia.   Chest  pain order set was initiated for which patient was given full dose aspirin.  Nitroglycerin and morphine made available as needed for pain.  Troponin remained negative and EKG did not note any acute ischemic changes.  Cardiology was consulted and recommended cardiac catheterization.   Cardiac catheterization on 3/12 revealed modest CAD involving a small first diagonal, distal LCx and nondominant RCA(see full report below).  No intervention was deemed necessary.  Patient was recommended to continue medical management with follow-up with her cardiologist Dr. Daneen Schick in the outpatient setting. 2. Essential hypertension on amlodipine. 3. Recently diagnosed polymyalgia rheumatica continued on prednisone. 4. History of allergies on Singulair. 5. Hypothyroidism on thyroid replacement which I have discussed with pharmacy to dose. 6. Diabetes mellitus type 2 on diet.  Last hemoglobin A1c 6 on 03/04/2018. 7. History of gastric bypass. 8. Morbid obesity 9. Leukocytosis thought likely  from recent starting of steroids as patient without fever or infectious complaints.   Follow UP  Follow-up Information    Belva Crome, MD Follow up.   Specialty:  Cardiology Contact information: [address] [place] [address] [phone]            Consults obtained: Cardiology Dr. Peter Martinique  Discharge Condition: Stable  Diet and Activity recommendation: See Discharge Instructions below   Discharge Instructions    Discharge instructions   Complete by:  As directed    Follow with Primary MD Marin Olp, MD within 1 week.  There was no clear cause of your chest pain found on the recent cardiac cath.  Cardiology recommending continued medical management with medication.  It is recommended that you follow-up with your cardiologist Dr. Daneen Schick in the office.  Get CBC and BMP  -  checked  by Primary MD or SNF MD in 5-7 days ( we routinely change or add medications that can affect your baseline labs and fluid status, therefore we recommend that you get the mentioned basic workup next visit with your PCP, your PCP may decide not to get them or add new tests based on their clinical decision)  Activity: As tolerated with fall precaution  Disposition: Home   Diet: Heart Healthy     For Heart failure patients - Check your Weight same time everyday, if you gain over 2 pounds, or you develop in leg swelling, experience more shortness of breath or chest pain, call your Primary doctor immediately. Follow Cardiac Low Salt Diet and 1.5 lit/day fluid restriction.  Special Instructions: If you have smoked or chewed Tobacco  in the last 2 yrs please stop smoking, stop any regular Alcohol  and or any Recreational drug use.  On your next visit with your primary care physician please Get Medicines reviewed and adjusted.  Please request your Marin Olp, MD to go over all Hospital Tests and Procedure/Radiological results at the follow up, please get  all Hospital records sent to your Prim MD by signing hospital release before you go home.  If you experience worsening of your admission symptoms, develop shortness of breath, life threatening emergency, suicidal or homicidal thoughts you must seek medical attention immediately by calling 911 or calling your MD immediately  if symptoms less severe.  You Must read complete instructions/literature along with all the possible adverse reactions/side effects for all the Medicines you take and that have been prescribed to you. Take any new Medicines after you have completely understood and accpet all the possible adverse reactions/side effects.   Do  not drive, operate heavy machinery, perform activities at heights, swimming or participation in water activities or provide baby sitting services if your were admitted for syncope or siezures until you have seen by Primary MD or a Neurologist and advised to do so again.  Do not drive when taking Pain medications.  Do not take more than prescribed Pain, Sleep and Anxiety Medications  Wear Seat belts while driving.   Please note  You were cared for by a hospitalist during your hospital stay. If you have any questions about your discharge medications or the care you received while you were in the hospital after you are discharged, you can call the unit and asked to speak with the hospitalist on call if the hospitalist that took care of you is not available. Once you are discharged, your primary care physician will handle any further medical issues. Please note that NO REFILLS for any discharge medications will be authorized once you are discharged, as it is imperative that you return to your primary care physician (or establish a relationship with a primary care physician if you do not have one) for your aftercare needs so that they can reassess your need for medications and monitor your lab values.        Discharge Medications     Allergies as of 04/02/2018       Reactions   Flexeril [cyclobenzaprine]    On Pristiq  With possible serotonin reaction to flexeril   Ace Inhibitors    States after a surgery she was told this. Not sure what the reason was.    Azithromycin    REACTION: Rash   Codeine    REACTION: Upset stomach   Repatha [evolocumab]    Myalgias      Medication List    TAKE these medications   acetaminophen 500 MG tablet Commonly known as:  TYLENOL Take 1,000 mg by mouth every 8 (eight) hours as needed for mild pain or headache.   albuterol 108 (90 Base) MCG/ACT inhaler Commonly known as:  PROVENTIL HFA;VENTOLIN HFA Inhale 1-2 puffs into the lungs every 4 (four) hours as needed for wheezing or shortness of breath.   Alcohol Swabs Pads Use to check blood sugar once a day   amLODipine 10 MG tablet Commonly known as:  NORVASC Take 10 mg by mouth every evening.   aspirin 81 MG tablet Take 81 mg by mouth daily.   atorvastatin 20 MG tablet Commonly known as:  LIPITOR TAKE 1 TABLET ONE TIME WEEKLY What changed:  See the new instructions.   cyanocobalamin 1000 MCG/ML injection Commonly known as:  (VITAMIN B-12) Inject 1 mL (1,000 mcg total) into the muscle every 30 (thirty) days. Uses on amonthly basis   desvenlafaxine 100 MG 24 hr tablet Commonly known as:  PRISTIQ TAKE 1 TABLET EVERY DAY   Fish Oil 1000 MG Caps Take 1,000 mg by mouth daily.   fluticasone 50 MCG/ACT nasal spray Commonly known as:  FLONASE USE 1 SPRAY IN EACH NOSTRIL EVERY DAY What changed:  See the new instructions.   glucose blood test strip Test blood sugar once per day   HYDROcodone-acetaminophen 5-325 MG tablet Commonly known as:  NORCO/VICODIN Take 1 tablet by mouth every 6 (six) hours as needed (may refill on 03/22/2018). What changed:  reasons to take this   ipratropium 0.06 % nasal spray Commonly known as:  Atrovent Place 2 sprays into both nostrils 4 (four) times daily.   loperamide 2 MG capsule Commonly known as:  IMODIUM Take 4 mg by mouth as needed for diarrhea or loose stools.   LORazepam 0.5 MG tablet Commonly known as:  ATIVAN Take 0.5-1 tablets (0.25-0.5 mg total) by mouth daily as needed for anxiety (do not drive for 8 hours after taking).   montelukast 10 MG tablet Commonly known as:  SINGULAIR Take 1 tablet (10 mg total) by mouth daily with breakfast.   nitroGLYCERIN 0.4 MG SL tablet Commonly known as:  NITROSTAT Place 1 tablet (0.4 mg total) under the tongue every 5 (five) minutes as needed for chest pain.   predniSONE 5 MG tablet Commonly known as:  DELTASONE Take 3 tablets (15 mg total) by mouth daily with breakfast.   Se-Tan PLUS 162-115.2-1 MG Caps Take 1 capsule by mouth 2 (two) times daily.   SYSTANE COMPLETE OP Apply 1 drop to eye daily as needed (dryness).   Tirosint 150 MCG Caps Generic drug:  Levothyroxine Sodium Take 150-300 mcg by mouth every morning. Take 2 capsules by mouth for two days then do not take any on the third day; on the fourth and Fifth day take 2 capsules. On the sixth day to not take any. Seventh day take 2 capsules on the eight day take 1 capsule and the ninth day do not take any.  Start over after the ninth day. in morning on empty stomach for thyroid (name brand only DAW)   True Metrix Air Glucose Meter w/Device Kit USE AS DIRECTED   True Metrix Level 2 Normal Soln Use to calibrate meter as needed   TRUEplus Lancets 30G Misc Check blood sugar once per day   Vitamin D 1000 units capsule Take 5,000 Units by mouth daily.       Major procedures and Radiology Reports - PLEASE review detailed and final reports for all details, in brief -   Cardiac catheterization 04/02/2018  Mid RCA lesion is 70% stenosed.  Dist Cx-1 lesion is 65% stenosed.  Dist Cx-2 lesion is 70% stenosed.  1st Diag lesion is 75% stenosed.  The left ventricular systolic function is normal.  LV end diastolic pressure is normal.  The left ventricular ejection  fraction is 55-65% by visual estimate.   1. Modest CAD involving a small first diagonal, distal LCx and nondominant RCA 2. Normal LV function 3. Normal LVEDP  Plan: continue medical management. Patient is a candidate for DC later today. She is cleared for knee surgery next week. Recommend follow up with Dr. Daneen Schick as outpatient.    Dg Chest 2 View  Result Date: 04/01/2018 CLINICAL DATA:  78 year old female with acute chest pain at 1800 hours. EXAM: CHEST - 2 VIEW COMPARISON:  Chest radiographs 07/07/2017 and earlier. FINDINGS: Chronic tortuosity of the thoracic aorta appears stable. Other mediastinal contours are within normal limits. Lung volumes are within normal limits. Both lungs appear clear. No pneumothorax or pleural effusion. Visualized tracheal air column is within normal limits. Stable abdominal surgical clips. Negative visible bowel gas pattern. Partially visible spinal and left shoulder hardware. No acute osseous abnormality identified. IMPRESSION: No acute cardiopulmonary abnormality. Electronically Signed   By: Genevie Ann M.D.   On: 04/01/2018 20:05    Micro Results    No results found for this or any previous visit (from the past 240 hour(s)).     Today   Subjective    [name] today states that her chest pain resolved after 2 doses of nitroglycerin given yesterday. Objective   Blood pressure (!) 143/72, pulse 65, temperature 97.7  F (36.5 C), temperature source Oral, resp. rate 15, height 5' 1"  (1.549 m), weight 87.2 kg, SpO2 97 %.  No intake or output data in the 24 hours ending 04/02/18 1504  Exam  Constitutional: Elderly female in NAD, calm, comfortable Eyes: PERRL, lids and conjunctivae normal currently wearing glasses. ENMT: Mucous membranes are moist. Posterior pharynx clear of any exudate or lesions.  Neck: normal, supple, no masses, no thyromegaly.  No JVD. Respiratory: clear to auscultation bilaterally, no wheezing, no crackles. Normal  respiratory effort. No accessory muscle use.  Cardiovascular: Regular rate and rhythm, no murmurs / rubs / gallops.  Ankle swelling more appreciated on the left leg than on the right. 2+ pedal pulses. No carotid bruits.  Abdomen: no tenderness, no masses palpated. No hepatosplenomegaly. Bowel sounds positive.  Musculoskeletal: no clubbing / cyanosis.  Osteoarthritis noted of the bilateral knees worse on left.   Normal muscle tone.  Skin: no rashes, lesions, ulcers. No induration Neurologic: CN 2-12 grossly intact. Sensation intact, DTR normal. Strength 5/5 in all 4.  Psychiatric: Normal judgment and insight. Alert and oriented x 3. Normal mood.    Data Review   CBC w Diff:  Lab Results  Component Value Date   WBC 13.2 (H) 04/01/2018   HGB 13.2 04/01/2018   HCT 41.2 04/01/2018   PLT 279 04/01/2018   LYMPHOPCT 25.8 06/28/2015   MONOPCT 9.7 06/28/2015   EOSPCT 9.3 (H) 06/28/2015   BASOPCT 0.8 06/28/2015    CMP:  Lab Results  Component Value Date   NA 135 04/01/2018   K 4.3 04/01/2018   CL 101 04/01/2018   CO2 25 04/01/2018   BUN 13 04/01/2018   CREATININE 0.86 04/01/2018   PROT 6.0 03/03/2018   ALBUMIN 3.4 (L) 03/03/2018   BILITOT 0.5 03/03/2018   ALKPHOS 91 03/03/2018   AST 21 03/03/2018   ALT 26 03/03/2018  .   Total Time in preparing paper work, data evaluation and todays exam - 35 minutes  Norval Morton M.D on 04/02/2018 at 3:04 PM  [facility]   Office  [phone]

## 2018-04-02 NOTE — Interval H&P Note (Signed)
History and Physical Interval Note:  04/02/2018 1:36 PM  [name]  has presented today for surgery, with the diagnosis of Chest Pain.  The various methods of treatment have been discussed with the patient and family. After consideration of risks, benefits and other options for treatment, the patient has consented to  Procedure(s): LEFT HEART CATH AND CORONARY ANGIOGRAPHY (N/A) as a surgical intervention.  The patient's history has been reviewed, patient examined, no change in status, stable for surgery.  I have reviewed the patient's chart and labs.  Questions were answered to the patient's satisfaction.    Cath Lab Visit (complete for each Cath Lab visit)  Clinical Evaluation Leading to the Procedure:   ACS: Yes.    Non-ACS:    Anginal Classification: CCS II  Anti-ischemic medical therapy: Minimal Therapy (1 class of medications)  Non-Invasive Test Results: No non-invasive testing performed  Prior CABG: No previous CABG       [name] [facility] 04/02/2018 1:36 PM

## 2018-04-02 NOTE — H&P (View-Only) (Signed)
Cardiology Consultation:   Patient ID: [name] MRN: [id number]; DOB: 05/10/1940  Admit date: 04/01/2018 Date of Consult: 04/02/2018  Primary Care Provider: Hunter, Stephen O, MD Primary Cardiologist: Henry W Smith III, MD  Primary Electrophysiologist:  None   Patient Profile:   [name] is a 78 y.o. female with a hx of gastric bypass surgery, diet controlled DM, HTN, HLD, hypothyroidism and recently polymalgia rheumatica who is being seen today for the evaluation of chest pain at the request of Dr. Smith.  History of Present Illness:   [name] had an episode of chest pain in June 2019 that radiated to her jaw and left arm. She was evaluated with a cardiac calcium score of 990. She had normal stress test in December as part of clearance for knee surgery.   She says that yesterday around 6 pm she went to the kitchen to make dinner and developed chest pain that she describes as sharp and pressure. It radiated to her right jaw. She had no associated shortness of breath, palpitations, lightheadedness or any other complaints. She took a SL NTG and went to lie down. After 5 minutes the pain continued so she took another NTG. The pain then resolved, having had a duration of about 10-15 minutes. Her daughter called 911 and upon their assessment they brought her to the ED for evaluation. The patient has had no further chest pain. Her symptoms were similar to those those occurred in June except did not radiate to left arm.   [name] does not smoke and she only drinks an occ glass of wine.   Her knee surgery was scheduled for 03/30/18 but she developed generalized muscle aches. She was felt to have polymyalgia rheumatica and placed on prednisone. She takes her statin only once a week on Friday due to the consequent muscle aches.  She was on Repatha but she called the company to discuss the relationship of Repatha to her muscle aches and was told that this is a rare side effect so  she stopped the Repatha, last dose in early February.  Past Medical History:  Diagnosis Date  . Anemia   . Arthritis    osteoarthritis - shoulder, knees & hips  . Asthma   . Depression   . Diabetes mellitus without complication (HCC)    no meds  . Hypertension    Dr. J. Jenkins manages BP, pt. reports MD has not found a need for treatment   . Hypothyroidism   . Sleep difficulties    had sleep study -2009, prior to gastric surgery, told that there was not a need for f/u  . Thyroid disease   . Vitamin B 12 deficiency     Past Surgical History:  Procedure Laterality Date  . ABDOMINAL HYSTERECTOMY    . BARIATRIC SURGERY    . CARDIAC CATHETERIZATION     [facility]- 30 yrs. ago  . CATARACT EXTRACTION, BILATERAL     late 2018  . OOPHORECTOMY    . pantallor arthrodesis with rod placement left foot    . TONSILLECTOMY    . TOTAL SHOULDER ARTHROPLASTY Left 03/12/2012   Procedure: TOTAL SHOULDER ARTHROPLASTY;  Surgeon: Kevin M Supple, MD;  Location: [facility] OR;  Service: Orthopedics;  Laterality: Left;     Home Medications:  Prior to Admission medications   Medication Sig Start Date End Date Taking? Authorizing Provider  acetaminophen (TYLENOL) 500 MG tablet Take 1,000 mg by mouth every 8 (eight) hours as needed for mild   pain or headache.   Yes [provider]  albuterol (PROVENTIL HFA;VENTOLIN HFA) 108 (90 Base) MCG/ACT inhaler Inhale 1-2 puffs into the lungs every 4 (four) hours as needed for wheezing or shortness of breath. 02/15/15  Yes Hunter, Stephen O, MD  Alcohol Swabs PADS Use to check blood sugar once a day 05/27/17  Yes Hunter, Stephen O, MD  amLODipine (NORVASC) 10 MG tablet Take 10 mg by mouth every evening.   Yes [provider]  aspirin 81 MG tablet Take 81 mg by mouth daily.   Yes [provider]  atorvastatin (LIPITOR) 20 MG tablet TAKE 1 TABLET ONE TIME WEEKLY Patient taking differently: Take 20 mg by mouth every Friday.  02/25/18  Yes Hunter, Stephen O, MD   Blood Glucose Calibration (TRUE METRIX LEVEL 2) Normal SOLN Use to calibrate meter as needed 05/27/17  Yes Hunter, Stephen O, MD  Blood Glucose Monitoring Suppl (TRUE METRIX AIR GLUCOSE METER) w/Device KIT USE AS DIRECTED 03/31/18  Yes Hunter, Stephen O, MD  Cholecalciferol (VITAMIN D) 1000 UNITS capsule Take 5,000 Units by mouth daily.    Yes [provider]  cyanocobalamin (,VITAMIN B-12,) 1000 MCG/ML injection Inject 1 mL (1,000 mcg total) into the muscle every 30 (thirty) days. Uses on amonthly basis 09/03/16  Yes Hunter, Stephen O, MD  desvenlafaxine (PRISTIQ) 100 MG 24 hr tablet TAKE 1 TABLET EVERY DAY Patient taking differently: Take 100 mg by mouth daily.  02/25/18  Yes Hunter, Stephen O, MD  FeFum-FePo-FA-B Cmp-C-Zn-Mn-Cu (SE-TAN PLUS) 162-115.2-1 MG CAPS Take 1 capsule by mouth 2 (two) times daily. 04/11/17  Yes Hunter, Stephen O, MD  fluticasone (FLONASE) 50 MCG/ACT nasal spray USE 1 SPRAY IN EACH NOSTRIL EVERY DAY Patient taking differently: Place 1 spray into both nostrils daily.  09/23/17  Yes Hunter, Stephen O, MD  glucose blood test strip Test blood sugar once per day 05/27/17  Yes Hunter, Stephen O, MD  HYDROcodone-acetaminophen (NORCO/VICODIN) 5-325 MG tablet Take 1 tablet by mouth every 6 (six) hours as needed (may refill on 03/22/2018). Patient taking differently: Take 1 tablet by mouth every 6 (six) hours as needed for moderate pain (may refill on 03/22/2018).  03/16/18  Yes Hunter, Stephen O, MD  Levothyroxine Sodium (TIROSINT) 150 MCG CAPS Take 150-300 mcg by mouth every morning. Take 2 capsules by mouth for two days then do not take any on the third day; on the fourth and Fifth day take 2 capsules. On the sixth day to not take any. Seventh day take 2 capsules on the eight day take 1 capsule and the ninth day do not take any.  Start over after the ninth day. in morning on empty stomach for thyroid (name brand only DAW) 09/18/17  Yes [provider]  loperamide (IMODIUM) 2 MG  capsule Take 4 mg by mouth as needed for diarrhea or loose stools.   Yes [provider]  LORazepam (ATIVAN) 0.5 MG tablet Take 0.5-1 tablets (0.25-0.5 mg total) by mouth daily as needed for anxiety (do not drive for 8 hours after taking). 07/16/17  Yes Hunter, Stephen O, MD  montelukast (SINGULAIR) 10 MG tablet Take 1 tablet (10 mg total) by mouth daily with breakfast. 04/11/17  Yes Hunter, Stephen O, MD  nitroGLYCERIN (NITROSTAT) 0.4 MG SL tablet Place 1 tablet (0.4 mg total) under the tongue every 5 (five) minutes as needed for chest pain. 09/29/17 04/02/18 Yes Smith, Henry W, MD  Omega-3 Fatty Acids (FISH OIL) 1000 MG CAPS Take 1,000 mg   by mouth daily.    Yes [provider]  predniSONE (DELTASONE) 5 MG tablet Take 3 tablets (15 mg total) by mouth daily with breakfast. 03/17/18  Yes Hunter, Stephen O, MD  Propylene Glycol (SYSTANE COMPLETE OP) Apply 1 drop to eye daily as needed (dryness).    Yes [provider]  TRUEPLUS LANCETS 30G MISC Check blood sugar once per day 05/27/17  Yes Hunter, Stephen O, MD  ipratropium (ATROVENT) 0.06 % nasal spray Place 2 sprays into both nostrils 4 (four) times daily. Patient not taking: Reported on 04/02/2018 02/13/18   Parker, Caleb M, MD    Inpatient Medications: Scheduled Meds: . amLODipine  10 mg Oral QPM  . aspirin  81 mg Oral Daily  . [START ON 04/03/2018] atorvastatin  20 mg Oral Q Fri  . desvenlafaxine  100 mg Oral Daily  . enoxaparin (LOVENOX) injection  40 mg Subcutaneous Q24H  . fluticasone  1 spray Each Nare Daily  . insulin aspart  0-9 Units Subcutaneous Q4H  . [START ON 04/08/2018] levothyroxine  150 mcg Oral Once  . [START ON 04/04/2018] levothyroxine  300 mcg Oral Q0600  . [START ON 04/07/2018] levothyroxine  300 mcg Oral Once  . montelukast  10 mg Oral Q breakfast  . multivitamins with iron  1 tablet Oral BID  . omega-3 acid ethyl esters  1 g Oral Daily  . predniSONE  15 mg Oral Daily   Continuous Infusions:  PRN  Meds: acetaminophen **OR** acetaminophen, albuterol, HYDROcodone-acetaminophen, nitroGLYCERIN, ondansetron **OR** ondansetron (ZOFRAN) IV  Allergies:    Allergies  Allergen Reactions  . Flexeril [Cyclobenzaprine]     On Pristiq  With possible serotonin reaction to flexeril  . Ace Inhibitors     States after a surgery she was told this. Not sure what the reason was.   . Azithromycin     REACTION: Rash  . Codeine     REACTION: Upset stomach  . Repatha [Evolocumab]     Myalgias    Social History:   Social History   Socioeconomic History  . Marital status: Widowed    Spouse name: Not on file  . Number of children: Not on file  . Years of education: Not on file  . Highest education level: Not on file  Occupational History  . Occupation: retired nurse    Employer: RETIRED  Social Needs  . Financial resource strain: Not on file  . Food insecurity:    Worry: Not on file    Inability: Not on file  . Transportation needs:    Medical: Not on file    Non-medical: Not on file  Tobacco Use  . Smoking status: Former Smoker    Packs/day: 0.70    Years: 20.00    Pack years: 14.00    Last attempt to quit: 03/04/1978    Years since quitting: 40.1  . Smokeless tobacco: Never Used  Substance and Sexual Activity  . Alcohol use: Yes    Alcohol/week: 2.0 standard drinks    Types: 2 Glasses of wine per week    Comment: with dinner  or social rare -   . Drug use: No  . Sexual activity: Not Currently  Lifestyle  . Physical activity:    Days per week: Not on file    Minutes per session: Not on file  . Stress: Not on file  Relationships  . Social connections:    Talks on phone: Not on file    Gets together: Not on file      Attends religious service: Not on file    Active member of club or organization: Not on file    Attends meetings of clubs or organizations: Not on file    Relationship status: Not on file  . Intimate partner violence:    Fear of current or ex partner: Not on  file    Emotionally abused: Not on file    Physically abused: Not on file    Forced sexual activity: Not on file  Other Topics Concern  . Not on file  Social History Narrative   Widowed 2013. 2 kids. 4 grandkids. Oldest daughter lives in [place] and has 2 grandkids that are now in Scientist, physiological, PT school at [employer].       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 Stroke Mother   . Liver cancer Father   . Cancer Father        liver  . Breast cancer Neg Hx      ROS:  Please see the history of present illness.   All other ROS reviewed and negative.     Physical Exam/Data:   Vitals:   04/02/18 0530 04/02/18 0545 04/02/18 0615 04/02/18 0629  BP:   (!) 163/94   Pulse: (!) 53 (!) 53 (!) 58   Resp: 12     Temp:   97.7 F (36.5 C)   TempSrc:   Oral   SpO2: 96% 94% 95%   Weight:   87.2 kg   Height:    5' 1" (1.549 m)   No intake or output data in the 24 hours ending 04/02/18 0746 Last 3 Weights 04/02/2018 03/16/2018 03/03/2018  Weight (lbs) 192 lb 3.9 oz 193 lb 6.4 oz 199 lb  Weight (kg) 87.2 kg 87.726 kg 90.266 kg     Body mass index is 36.32 kg/m.  General:  Obese, well developed, in no acute distress HEENT: normal Lymph: no adenopathy Neck: no JVD Endocrine:  No thryomegaly Vascular: No carotid bruits; FA pulses 2+ bilaterally without bruits  Cardiac:  normal S1, S2; RRR; no murmur  Lungs:  clear to auscultation bilaterally, no wheezing, rhonchi or rales  Abd: soft, nontender, no hepatomegaly  Ext: no edema.  Lower leg is larger than the right due to a prior fusion surgery in her left foot. Musculoskeletal:  No deformities, BUE and BLE strength normal and equal Skin: warm and dry  Neuro:  CNs 2-12 intact, no focal abnormalities noted Psych:  Normal affect   EKG:  The EKG was personally reviewed and  demonstrates:  Sinus rhythm, 59 bpm, PAC, incomplete LBBB Atrial premature complex, Inferior infarct, old Telemetry:  Telemetry was personally reviewed and demonstrates:  NSR 50s-60s  Relevant CV Studies:  Nuclear stress test 01/02/2018 Study Highlights   Nuclear stress EF: 65%. The left ventricular ejection fraction is normal (55-65%).  The patient had an ejection of Lexiscan. There were no ST or T wave changes following the injection.  This is a low risk study. There is no evidence of ischemia and no evidence of previous infarction. Left ventricular systolic function is normal  The study is normal.      Coronary Calcium Score 09/2017:  IMPRESSION:  Coronary calcium score of 990. This was 59 percentile for age and  sex matched control. This represents  a high calcium score, in the  presence of symptoms consider a functional test such as nuclear  stress test or coronary CTA with CT FFR.    Laboratory Data:  Chemistry Recent Labs  Lab 04/01/18 1923  NA 135  K 4.3  CL 101  CO2 25  GLUCOSE 148*  BUN 13  CREATININE 0.86  CALCIUM 10.4*  GFRNONAA >60  GFRAA >60  ANIONGAP 9    No results for input(s): PROT, ALBUMIN, AST, ALT, ALKPHOS, BILITOT in the last 168 hours. Hematology Recent Labs  Lab 04/01/18 1923  WBC 13.2*  RBC 4.58  HGB 13.2  HCT 41.2  MCV 90.0  MCH 28.8  MCHC 32.0  RDW 14.0  PLT 279   Cardiac EnzymesNo results for input(s): TROPONINI in the last 168 hours.  Recent Labs  Lab 04/01/18 1936  TROPIPOC 0.00    BNPNo results for input(s): BNP, PROBNP in the last 168 hours.  DDimer No results for input(s): DDIMER in the last 168 hours.  Radiology/Studies:  Dg Chest 2 View  Result Date: 04/01/2018 CLINICAL DATA:  78-year-old female with acute chest pain at 1800 hours. EXAM: CHEST - 2 VIEW COMPARISON:  Chest radiographs 07/07/2017 and earlier. FINDINGS: Chronic tortuosity of the thoracic aorta appears stable. Other mediastinal contours are within  normal limits. Lung volumes are within normal limits. Both lungs appear clear. No pneumothorax or pleural effusion. Visualized tracheal air column is within normal limits. Stable abdominal surgical clips. Negative visible bowel gas pattern. Partially visible spinal and left shoulder hardware. No acute osseous abnormality identified. IMPRESSION: No acute cardiopulmonary abnormality. Electronically Signed   By: H  Hall M.D.   On: 04/01/2018 20:05    Assessment and Plan:   1. Chest pain -Patient with substernal chest sharp/pressure pain that was relieved with sublingual nitroglycerin last evening.  Her pain radiated to her right jaw.  No other associated symptoms.  This is similar to an episode she had in June.  Currently chest pain-free. -Troponins negative x2 -EKG without acute ischemic changes -Patient has known coronary artery calcification with a calcium score of 990.  She had normal stress test in 12/2017. -Patient has plans for knee surgery and would benefit from a complete evaluation of her coronary arteries with cardiac catheterization.  2. Hyperlipidemia -Pt with significant coronary artery calcifications by CT with calcium score of 990. -Pt has had trouble tolerating statins due to myalgias.  She is taking atorvastatin 20 mg once a week and tolerates the myalgias that occur for about 2 days after.  She was on Repatha but developed generalized muscle aches and stopped that in early February. -LDL was down to 73 on the PCSK9 inhibitor.  She will need an updated lipid panel since this is been stopped.  3.  Hypertension -Home medications include amlodipine 10 mg -Blood pressure is elevated, continue to watch, may need adjustment in meds  4. DM type 2 diet controlled -Globin A1c was 6.0 in February of this year.  Well controlled.  For questions or updates, please contact [facility] Please consult [email or web address] for contact info under     Signed, Janine Hammond, NP  04/02/2018 7:46  AM   Patient seen and examined and history reviewed. Agree with above findings and plan. 78 yo WF with history of HLD, HTN, DM type 2 diet controlled presents for evaluation of chest pain. Patient was walking in her kitchen when she developed mid SSCP radiating to her right jaw. Lasted about 15 minutes relieved with sl   Ntg x 2. Had similar chest pain last June. This led to a coronary CT calcium score which was quite high 96th percentile. Lexiscan myoview was negative. Patient recently also diagnosed with PMR and is on steroids. Notes significant improvement in muscle aches. Only taking statin one day a week due to muscle pains and also would not take Repatha because of this. She is planning to have TKR next week by Dr Maureen Ralphs.  On exam she is obese. No JVD or bruits Lungs are clear. CV RRR with soft systolic murmur RUSB.  Abd soft NT. No bruits. Pedal and radial pulses good.  Ecg shows NSR with mild NS ST abnormality.  Troponin negative  Impression: 1. Acute chest pain with high risk of cardiac etiology. High calcium score. Prior Myoview was normal but this could be balanced ischemia. Given need for clearance for upcoming general anesthesia I think we need definitive evaluation of her coronary status. Further stress testing will not be useful. Coronary CTA likely nondiagnostic due to heavy calcium burden. Recommend coronary angiography today. The procedure and risks were reviewed including but not limited to death, myocardial infarction, stroke, arrythmias, bleeding, transfusion, emergency surgery, dye allergy, or renal dysfunction. The patient voices understanding and is agreeable to proceed. She also understands the possibility of needing a stent if significant stenosis is identified and this would delay her having knee surgery  Due to need for DAPT.  2. HLD. Now that PMR is treated may need to revisit statins and/or Repatha since it is likely her myalgias were more related to PMR and not medication  related. 3. HTN controlled. 4. Osteoarthritis of the knees.   Peter Martinique, [place] 04/02/2018 10:35 AM

## 2018-04-02 NOTE — Consult Note (Addendum)
Cardiology Consultation:   Patient ID: AMIAYAH GIEBEL MRN: [id number]; DOB: 1940-08-24  Admit date: 04/01/2018 Date of Consult: 04/02/2018  Primary Care Provider: Marin Olp, MD Primary Cardiologist: Sinclair Grooms, MD  Primary Electrophysiologist:  None   Patient Profile:   [name] is a 78 y.o. female with a hx of gastric bypass surgery, diet controlled DM, HTN, HLD, hypothyroidism and recently polymalgia rheumatica who is being seen today for the evaluation of chest pain at the request of Dr. Tamala Julian.  History of Present Illness:   [name] had an episode of chest pain in June 2019 that radiated to her jaw and left arm. She was evaluated with a cardiac calcium score of 990. She had normal stress test in December as part of clearance for knee surgery.   She says that yesterday around 6 pm she went to the kitchen to make dinner and developed chest pain that she describes as sharp and pressure. It radiated to her right jaw. She had no associated shortness of breath, palpitations, lightheadedness or any other complaints. She took a SL NTG and went to lie down. After 5 minutes the pain continued so she took another NTG. The pain then resolved, having had a duration of about 10-15 minutes. Her daughter called 911 and upon their assessment they brought her to the ED for evaluation. The patient has had no further chest pain. Her symptoms were similar to those those occurred in June except did not radiate to left arm.   [name] does not smoke and she only drinks an occ glass of wine.   Her knee surgery was scheduled for 03/30/18 but she developed generalized muscle aches. She was felt to have polymyalgia rheumatica and placed on prednisone. She takes her statin only once a week on Friday due to the consequent muscle aches.  She was on Repatha but she called the company to discuss the relationship of Repatha to her muscle aches and was told that this is a rare side effect so  she stopped the Repatha, last dose in early February.  Past Medical History:  Diagnosis Date  . Anemia   . Arthritis    osteoarthritis - shoulder, knees & hips  . Asthma   . Depression   . Diabetes mellitus without complication (HCC)    no meds  . Hypertension    Dr. Orinda Kenner manages BP, pt. reports MD has not found a need for treatment   . Hypothyroidism   . Sleep difficulties    had sleep study -2009, prior to gastric surgery, told that there was not a need for f/u  . Thyroid disease   . Vitamin B 12 deficiency     Past Surgical History:  Procedure Laterality Date  . ABDOMINAL HYSTERECTOMY    . BARIATRIC SURGERY    . CARDIAC CATHETERIZATION     [facility]- 30 yrs. ago  . CATARACT EXTRACTION, BILATERAL     late 2018  . OOPHORECTOMY    . pantallor arthrodesis with rod placement left foot    . TONSILLECTOMY    . TOTAL SHOULDER ARTHROPLASTY Left 03/12/2012   Procedure: TOTAL SHOULDER ARTHROPLASTY;  Surgeon: Marin Shutter, MD;  Location: [facility];  Service: Orthopedics;  Laterality: Left;     Home Medications:  Prior to Admission medications   Medication Sig Start Date End Date Taking? Authorizing Provider  acetaminophen (TYLENOL) 500 MG tablet Take 1,000 mg by mouth every 8 (eight) hours as needed for mild  pain or headache.   Yes [provider]  albuterol (PROVENTIL HFA;VENTOLIN HFA) 108 (90 Base) MCG/ACT inhaler Inhale 1-2 puffs into the lungs every 4 (four) hours as needed for wheezing or shortness of breath. 02/15/15  Yes Marin Olp, MD  Alcohol Swabs PADS Use to check blood sugar once a day 05/27/17  Yes Marin Olp, MD  amLODipine (NORVASC) 10 MG tablet Take 10 mg by mouth every evening.   Yes [provider]  aspirin 81 MG tablet Take 81 mg by mouth daily.   Yes [provider]  atorvastatin (LIPITOR) 20 MG tablet TAKE 1 TABLET ONE TIME WEEKLY Patient taking differently: Take 20 mg by mouth every Friday.  02/25/18  Yes Marin Olp, MD   Blood Glucose Calibration (TRUE METRIX LEVEL 2) Normal SOLN Use to calibrate meter as needed 05/27/17  Yes Marin Olp, MD  Blood Glucose Monitoring Suppl (TRUE METRIX AIR GLUCOSE METER) w/Device KIT USE AS DIRECTED 03/31/18  Yes Marin Olp, MD  Cholecalciferol (VITAMIN D) 1000 UNITS capsule Take 5,000 Units by mouth daily.    Yes [provider]  cyanocobalamin (,VITAMIN B-12,) 1000 MCG/ML injection Inject 1 mL (1,000 mcg total) into the muscle every 30 (thirty) days. Uses on amonthly basis 09/03/16  Yes Marin Olp, MD  desvenlafaxine (PRISTIQ) 100 MG 24 hr tablet TAKE 1 TABLET EVERY DAY Patient taking differently: Take 100 mg by mouth daily.  02/25/18  Yes Marin Olp, MD  FeFum-FePo-FA-B Cmp-C-Zn-Mn-Cu (SE-TAN PLUS) 162-115.2-1 MG CAPS Take 1 capsule by mouth 2 (two) times daily. 04/11/17  Yes Marin Olp, MD  fluticasone (FLONASE) 50 MCG/ACT nasal spray USE 1 SPRAY IN EACH NOSTRIL EVERY DAY Patient taking differently: Place 1 spray into both nostrils daily.  09/23/17  Yes Marin Olp, MD  glucose blood test strip Test blood sugar once per day 05/27/17  Yes Marin Olp, MD  HYDROcodone-acetaminophen (NORCO/VICODIN) 5-325 MG tablet Take 1 tablet by mouth every 6 (six) hours as needed (may refill on 03/22/2018). Patient taking differently: Take 1 tablet by mouth every 6 (six) hours as needed for moderate pain (may refill on 03/22/2018).  03/16/18  Yes Marin Olp, MD  Levothyroxine Sodium (TIROSINT) 150 MCG CAPS Take 150-300 mcg by mouth every morning. Take 2 capsules by mouth for two days then do not take any on the third day; on the fourth and Fifth day take 2 capsules. On the sixth day to not take any. Seventh day take 2 capsules on the eight day take 1 capsule and the ninth day do not take any.  Start over after the ninth day. in morning on empty stomach for thyroid (name brand only DAW) 09/18/17  Yes [provider]  loperamide (IMODIUM) 2 MG  capsule Take 4 mg by mouth as needed for diarrhea or loose stools.   Yes [provider]  LORazepam (ATIVAN) 0.5 MG tablet Take 0.5-1 tablets (0.25-0.5 mg total) by mouth daily as needed for anxiety (do not drive for 8 hours after taking). 07/16/17  Yes Marin Olp, MD  montelukast (SINGULAIR) 10 MG tablet Take 1 tablet (10 mg total) by mouth daily with breakfast. 04/11/17  Yes Marin Olp, MD  nitroGLYCERIN (NITROSTAT) 0.4 MG SL tablet Place 1 tablet (0.4 mg total) under the tongue every 5 (five) minutes as needed for chest pain. 09/29/17 04/02/18 Yes Belva Crome, MD  Omega-3 Fatty Acids (FISH OIL) 1000 MG CAPS Take 1,000 mg  by mouth daily.    Yes [provider]  predniSONE (DELTASONE) 5 MG tablet Take 3 tablets (15 mg total) by mouth daily with breakfast. 03/17/18  Yes Marin Olp, MD  Propylene Glycol (SYSTANE COMPLETE OP) Apply 1 drop to eye daily as needed (dryness).    Yes [provider]  TRUEPLUS LANCETS 30G MISC Check blood sugar once per day 05/27/17  Yes Marin Olp, MD  ipratropium (ATROVENT) 0.06 % nasal spray Place 2 sprays into both nostrils 4 (four) times daily. Patient not taking: Reported on 04/02/2018 02/13/18   Vivi Barrack, MD    Inpatient Medications: Scheduled Meds: . amLODipine  10 mg Oral QPM  . aspirin  81 mg Oral Daily  . [START ON 04/03/2018] atorvastatin  20 mg Oral Q Fri  . desvenlafaxine  100 mg Oral Daily  . enoxaparin (LOVENOX) injection  40 mg Subcutaneous Q24H  . fluticasone  1 spray Each Nare Daily  . insulin aspart  0-9 Units Subcutaneous Q4H  . [START ON 04/08/2018] levothyroxine  150 mcg Oral Once  . [START ON 04/04/2018] levothyroxine  300 mcg Oral Q0600  . [START ON 04/07/2018] levothyroxine  300 mcg Oral Once  . montelukast  10 mg Oral Q breakfast  . multivitamins with iron  1 tablet Oral BID  . omega-3 acid ethyl esters  1 g Oral Daily  . predniSONE  15 mg Oral Daily   Continuous Infusions:  PRN  Meds: acetaminophen **OR** acetaminophen, albuterol, HYDROcodone-acetaminophen, nitroGLYCERIN, ondansetron **OR** ondansetron (ZOFRAN) IV  Allergies:    Allergies  Allergen Reactions  . Flexeril [Cyclobenzaprine]     On Pristiq  With possible serotonin reaction to flexeril  . Ace Inhibitors     States after a surgery she was told this. Not sure what the reason was.   . Azithromycin     REACTION: Rash  . Codeine     REACTION: Upset stomach  . Repatha [Evolocumab]     Myalgias    Social History:   Social History   Socioeconomic History  . Marital status: Widowed    Spouse name: Not on file  . Number of children: Not on file  . Years of education: Not on file  . Highest education level: Not on file  Occupational History  . Occupation: retired Optician, dispensing: RETIRED  Social Needs  . Financial resource strain: Not on file  . Food insecurity:    Worry: Not on file    Inability: Not on file  . Transportation needs:    Medical: Not on file    Non-medical: Not on file  Tobacco Use  . Smoking status: Former Smoker    Packs/day: 0.70    Years: 20.00    Pack years: 14.00    Last attempt to quit: 03/04/1978    Years since quitting: 40.1  . Smokeless tobacco: Never Used  Substance and Sexual Activity  . Alcohol use: Yes    Alcohol/week: 2.0 standard drinks    Types: 2 Glasses of wine per week    Comment: with dinner  or social rare -   . Drug use: No  . Sexual activity: Not Currently  Lifestyle  . Physical activity:    Days per week: Not on file    Minutes per session: Not on file  . Stress: Not on file  Relationships  . Social connections:    Talks on phone: Not on file    Gets together: Not on file  Attends religious service: Not on file    Active member of club or organization: Not on file    Attends meetings of clubs or organizations: Not on file    Relationship status: Not on file  . Intimate partner violence:    Fear of current or ex partner: Not on  file    Emotionally abused: Not on file    Physically abused: Not on file    Forced sexual activity: Not on file  Other Topics Concern  . Not on file  Social History Narrative   Widowed 2013. 2 kids. 4 grandkids. Oldest daughter lives in [place] and has 2 grandkids that are now in Scientist, physiological, PT school at [employer].       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 Stroke Mother   . Liver cancer Father   . Cancer Father        liver  . Breast cancer Neg Hx      ROS:  Please see the history of present illness.   All other ROS reviewed and negative.     Physical Exam/Data:   Vitals:   04/02/18 0530 04/02/18 0545 04/02/18 0615 04/02/18 0629  BP:   (!) 163/94   Pulse: (!) 53 (!) 53 (!) 58   Resp: 12     Temp:   97.7 F (36.5 C)   TempSrc:   Oral   SpO2: 96% 94% 95%   Weight:   87.2 kg   Height:    5' 1" (1.549 m)   No intake or output data in the 24 hours ending 04/02/18 0746 Last 3 Weights 04/02/2018 03/16/2018 03/03/2018  Weight (lbs) 192 lb 3.9 oz 193 lb 6.4 oz 199 lb  Weight (kg) 87.2 kg 87.726 kg 90.266 kg     Body mass index is 36.32 kg/m.  General:  Obese, well developed, in no acute distress HEENT: normal Lymph: no adenopathy Neck: no JVD Endocrine:  No thryomegaly Vascular: No carotid bruits; FA pulses 2+ bilaterally without bruits  Cardiac:  normal S1, S2; RRR; no murmur  Lungs:  clear to auscultation bilaterally, no wheezing, rhonchi or rales  Abd: soft, nontender, no hepatomegaly  Ext: no edema.  Lower leg is larger than the right due to a prior fusion surgery in her left foot. Musculoskeletal:  No deformities, BUE and BLE strength normal and equal Skin: warm and dry  Neuro:  CNs 2-12 intact, no focal abnormalities noted Psych:  Normal affect   EKG:  The EKG was personally reviewed and  demonstrates:  Sinus rhythm, 59 bpm, PAC, incomplete LBBB Atrial premature complex, Inferior infarct, old Telemetry:  Telemetry was personally reviewed and demonstrates:  NSR 50s-60s  Relevant CV Studies:  Nuclear stress test 01/02/2018 Study Highlights   Nuclear stress EF: 65%. The left ventricular ejection fraction is normal (55-65%).  The patient had an ejection of Lexiscan. There were no ST or T wave changes following the injection.  This is a low risk study. There is no evidence of ischemia and no evidence of previous infarction. Left ventricular systolic function is normal  The study is normal.      Coronary Calcium Score 09/2017:  IMPRESSION:  Coronary calcium score of 990. This was 59 percentile for age and  sex matched control. This represents  a high calcium score, in the  presence of symptoms consider a functional test such as nuclear  stress test or coronary CTA with CT FFR.    Laboratory Data:  Chemistry Recent Labs  Lab 04/01/18 1923  NA 135  K 4.3  CL 101  CO2 25  GLUCOSE 148*  BUN 13  CREATININE 0.86  CALCIUM 10.4*  GFRNONAA >60  GFRAA >60  ANIONGAP 9    No results for input(s): PROT, ALBUMIN, AST, ALT, ALKPHOS, BILITOT in the last 168 hours. Hematology Recent Labs  Lab 04/01/18 1923  WBC 13.2*  RBC 4.58  HGB 13.2  HCT 41.2  MCV 90.0  MCH 28.8  MCHC 32.0  RDW 14.0  PLT 279   Cardiac EnzymesNo results for input(s): TROPONINI in the last 168 hours.  Recent Labs  Lab 04/01/18 1936  TROPIPOC 0.00    BNPNo results for input(s): BNP, PROBNP in the last 168 hours.  DDimer No results for input(s): DDIMER in the last 168 hours.  Radiology/Studies:  Dg Chest 2 View  Result Date: 04/01/2018 CLINICAL DATA:  78 year old female with acute chest pain at 1800 hours. EXAM: CHEST - 2 VIEW COMPARISON:  Chest radiographs 07/07/2017 and earlier. FINDINGS: Chronic tortuosity of the thoracic aorta appears stable. Other mediastinal contours are within  normal limits. Lung volumes are within normal limits. Both lungs appear clear. No pneumothorax or pleural effusion. Visualized tracheal air column is within normal limits. Stable abdominal surgical clips. Negative visible bowel gas pattern. Partially visible spinal and left shoulder hardware. No acute osseous abnormality identified. IMPRESSION: No acute cardiopulmonary abnormality. Electronically Signed   By: Genevie Ann M.D.   On: 04/01/2018 20:05    Assessment and Plan:   1. Chest pain -Patient with substernal chest sharp/pressure pain that was relieved with sublingual nitroglycerin last evening.  Her pain radiated to her right jaw.  No other associated symptoms.  This is similar to an episode she had in June.  Currently chest pain-free. -Troponins negative x2 -EKG without acute ischemic changes -Patient has known coronary artery calcification with a calcium score of 990.  She had normal stress test in 12/2017. -Patient has plans for knee surgery and would benefit from a complete evaluation of her coronary arteries with cardiac catheterization.  2. Hyperlipidemia -Pt with significant coronary artery calcifications by CT with calcium score of 990. -Pt has had trouble tolerating statins due to myalgias.  She is taking atorvastatin 20 mg once a week and tolerates the myalgias that occur for about 2 days after.  She was on Repatha but developed generalized muscle aches and stopped that in early February. -LDL was down to 73 on the PCSK9 inhibitor.  She will need an updated lipid panel since this is been stopped.  3.  Hypertension -Home medications include amlodipine 10 mg -Blood pressure is elevated, continue to watch, may need adjustment in meds  4. DM type 2 diet controlled -Globin A1c was 6.0 in February of this year.  Well controlled.  For questions or updates, please contact [place] Please consult [email or web address] for contact info under     Signed, Daune Perch, NP  04/02/2018 7:46  AM   Patient seen and examined and history reviewed. Agree with above findings and plan. 78 yo WF with history of HLD, HTN, DM type 2 diet controlled presents for evaluation of chest pain. Patient was walking in her kitchen when she developed mid SSCP radiating to her right jaw. Lasted about 15 minutes relieved with sl  Ntg x 2. Had similar chest pain last June. This led to a coronary CT calcium score which was quite high 96th percentile. Lexiscan myoview was negative. Patient recently also diagnosed with PMR and is on steroids. Notes significant improvement in muscle aches. Only taking statin one day a week due to muscle pains and also would not take Repatha because of this. She is planning to have TKR next week by Dr Maureen Ralphs.  On exam she is obese. No JVD or bruits Lungs are clear. CV RRR with soft systolic murmur RUSB.  Abd soft NT. No bruits. Pedal and radial pulses good.  Ecg shows NSR with mild NS ST abnormality.  Troponin negative  Impression: 1. Acute chest pain with high risk of cardiac etiology. High calcium score. Prior Myoview was normal but this could be balanced ischemia. Given need for clearance for upcoming general anesthesia I think we need definitive evaluation of her coronary status. Further stress testing will not be useful. Coronary CTA likely nondiagnostic due to heavy calcium burden. Recommend coronary angiography today. The procedure and risks were reviewed including but not limited to death, myocardial infarction, stroke, arrythmias, bleeding, transfusion, emergency surgery, dye allergy, or renal dysfunction. The patient voices understanding and is agreeable to proceed. She also understands the possibility of needing a stent if significant stenosis is identified and this would delay her having knee surgery  Due to need for DAPT.  2. HLD. Now that PMR is treated may need to revisit statins and/or Repatha since it is likely her myalgias were more related to PMR and not medication  related. 3. HTN controlled. 4. Osteoarthritis of the knees.   Peter Martinique, [place] 04/02/2018 10:35 AM

## 2018-04-02 NOTE — H&P (Signed)
History and Physical    [name]:[id number] DOB: 11/04/1940 DOA: 04/01/2018  PCP: Marin Olp, MD  Patient coming from: Home.  Chief Complaint: Chest pain.  HPI: [name] is a 78 y.o. female with history of recently diagnosed polymyalgia rheumatica on prednisone, gastric bypass surgery, diabetes mellitus on diet, hypertension, hypothyroidism presents today after patient started having chest pain last evening at home.  Patient states she was walking in the kitchen when she started having substernal chest pressure nonradiating with no associated shortness of breath diaphoresis or palpitations.  Had no nausea or vomiting.  She took nitroglycerin but with no relief had to take another one.  Following which patient chest pain resolved and came to the ER.  ED Course: In the ER EKG was showing normal sinus rhythm with incomplete LBBB.  Chest x-ray unremarkable troponin negative admitted for further management of chest pain.  Patient has had a stress test in December 2019 which was low risk.  Review of Systems: As per HPI, rest all negative.   Past Medical History:  Diagnosis Date  . Anemia   . Arthritis    osteoarthritis - shoulder, knees & hips  . Asthma   . Depression   . Diabetes mellitus without complication (HCC)    no meds  . Hypertension    Dr. Orinda Kenner manages BP, pt. reports MD has not found a need for treatment   . Hypothyroidism   . Sleep difficulties    had sleep study -2009, prior to gastric surgery, told that there was not a need for f/u  . Thyroid disease   . Vitamin B 12 deficiency     Past Surgical History:  Procedure Laterality Date  . ABDOMINAL HYSTERECTOMY    . BARIATRIC SURGERY    . CARDIAC CATHETERIZATION     [facility]- [facility] yrs. ago  . CATARACT EXTRACTION, BILATERAL     late 2018  . OOPHORECTOMY    . pantallor arthrodesis with rod placement left foot    . TONSILLECTOMY    . TOTAL SHOULDER ARTHROPLASTY Left 03/12/2012   Procedure:  TOTAL SHOULDER ARTHROPLASTY;  Surgeon: Marin Shutter, MD;  Location: [facility];  Service: Orthopedics;  Laterality: Left;     reports that she quit smoking about 40 years ago. She has a 14.00 pack-year smoking history. She has never used smokeless tobacco. She reports current alcohol use of about 2.0 standard drinks of alcohol per week. She reports that she does not use drugs.  Allergies  Allergen Reactions  . Flexeril [Cyclobenzaprine]     On Pristiq  With possible serotonin reaction to flexeril  . Ace Inhibitors     States after a surgery she was told this. Not sure what the reason was.   . Azithromycin     REACTION: Rash  . Codeine     REACTION: Upset stomach  . Repatha [Evolocumab]     Myalgias    Family History  Problem Relation Age of Onset  . Stroke Mother   . Liver cancer Father   . Cancer Father        liver  . Breast cancer Neg Hx     Prior to Admission medications   Medication Sig Start Date End Date Taking? Authorizing Provider  acetaminophen (TYLENOL) 500 MG tablet Take 1,000 mg by mouth every 8 (eight) hours as needed for mild pain or headache.   Yes [provider]  albuterol (PROVENTIL HFA;VENTOLIN HFA) 108 (90 Base) MCG/ACT inhaler  Inhale 1-2 puffs into the lungs every 4 (four) hours as needed for wheezing or shortness of breath. 02/15/15  Yes Marin Olp, MD  Alcohol Swabs PADS Use to check blood sugar once a day 05/27/17  Yes Marin Olp, MD  amLODipine (NORVASC) 10 MG tablet Take 10 mg by mouth every evening.   Yes [provider]  aspirin 81 MG tablet Take 81 mg by mouth daily.   Yes [provider]  atorvastatin (LIPITOR) 20 MG tablet TAKE 1 TABLET ONE TIME WEEKLY Patient taking differently: Take 20 mg by mouth every Friday.  02/25/18  Yes Marin Olp, MD  Blood Glucose Calibration (TRUE METRIX LEVEL 2) Normal SOLN Use to calibrate meter as needed 05/27/17  Yes Marin Olp, MD  Blood Glucose Monitoring Suppl (TRUE  METRIX AIR GLUCOSE METER) w/Device KIT USE AS DIRECTED 03/31/18  Yes Marin Olp, MD  Cholecalciferol (VITAMIN D) 1000 UNITS capsule Take 5,000 Units by mouth daily.    Yes [provider]  cyanocobalamin (,VITAMIN B-12,) 1000 MCG/ML injection Inject 1 mL (1,000 mcg total) into the muscle every 30 (thirty) days. Uses on amonthly basis 09/03/16  Yes Marin Olp, MD  desvenlafaxine (PRISTIQ) 100 MG 24 hr tablet TAKE 1 TABLET EVERY DAY Patient taking differently: Take 100 mg by mouth daily.  02/25/18  Yes Marin Olp, MD  FeFum-FePo-FA-B Cmp-C-Zn-Mn-Cu (SE-TAN PLUS) 162-115.2-1 MG CAPS Take 1 capsule by mouth 2 (two) times daily. 04/11/17  Yes Marin Olp, MD  fluticasone (FLONASE) 50 MCG/ACT nasal spray USE 1 SPRAY IN EACH NOSTRIL EVERY DAY Patient taking differently: Place 1 spray into both nostrils daily.  09/23/17  Yes Marin Olp, MD  glucose blood test strip Test blood sugar once per day 05/27/17  Yes Marin Olp, MD  HYDROcodone-acetaminophen (NORCO/VICODIN) 5-325 MG tablet Take 1 tablet by mouth every 6 (six) hours as needed (may refill on 03/22/2018). Patient taking differently: Take 1 tablet by mouth every 6 (six) hours as needed for moderate pain (may refill on 03/22/2018).  03/16/18  Yes Marin Olp, MD  Levothyroxine Sodium (TIROSINT) 150 MCG CAPS Take 150-300 mcg by mouth every morning. Take 2 capsules by mouth for two days then do not take any on the third day; on the fourth and Fifth day take 2 capsules. On the sixth day to not take any. Seventh day take 2 capsules on the eight day take 1 capsule and the ninth day do not take any.  Start over after the ninth day. in morning on empty stomach for thyroid (name brand only DAW) 09/18/17  Yes [provider]  loperamide (IMODIUM) 2 MG capsule Take 4 mg by mouth as needed for diarrhea or loose stools.   Yes [provider]  LORazepam (ATIVAN) 0.5 MG tablet Take 0.5-1 tablets (0.25-0.5 mg  total) by mouth daily as needed for anxiety (do not drive for 8 hours after taking). 07/16/17  Yes Marin Olp, MD  montelukast (SINGULAIR) 10 MG tablet Take 1 tablet (10 mg total) by mouth daily with breakfast. 04/11/17  Yes Marin Olp, MD  nitroGLYCERIN (NITROSTAT) 0.4 MG SL tablet Place 1 tablet (0.4 mg total) under the tongue every 5 (five) minutes as needed for chest pain. 09/29/17 04/02/18 Yes Belva Crome, MD  Omega-3 Fatty Acids (FISH OIL) 1000 MG CAPS Take 1,000 mg by mouth daily.    Yes [provider]  predniSONE (DELTASONE) 5 MG tablet Take 3 tablets (  15 mg total) by mouth daily with breakfast. 03/17/18  Yes Marin Olp, MD  Propylene Glycol (SYSTANE COMPLETE OP) Apply 1 drop to eye daily as needed (dryness).    Yes [provider]  TRUEPLUS LANCETS 30G MISC Check blood sugar once per day 05/27/17  Yes Marin Olp, MD  ipratropium (ATROVENT) 0.06 % nasal spray Place 2 sprays into both nostrils 4 (four) times daily. Patient not taking: Reported on 04/02/2018 02/13/18   Vivi Barrack, MD    Physical Exam: Vitals:   04/01/18 2131 04/02/18 0500 04/02/18 0530  BP: (!) 150/88 (!) 147/78   Pulse: 64 (!) 54 (!) 53  Resp: _0 Temp: 98.5 F (36.9 C)    TempSrc: Oral    SpO2: 98% 94% 96%      Constitutional: Moderately built and nourished. Vitals:   04/01/18 2131 04/02/18 0500 04/02/18 0530  BP: (!) 150/88 (!) 147/78   Pulse: 64 (!) 54 (!) 53  Resp: _1 Temp: 98.5 F (36.9 C)    TempSrc: Oral    SpO2: 98% 94% 96%   Eyes: Anicteric no pallor. ENMT: No discharge from the ears eyes nose and mouth. Neck: No mass felt.  No neck rigidity. Respiratory: No rhonchi or crepitations. Cardiovascular: S1-S2 heard. Abdomen: Soft nontender bowel sounds present. Musculoskeletal: No edema.  No joint effusion. Skin: No rash. Neurologic: Alert awake oriented to time place and person.  Moves all extremities. Psychiatric: Appears normal.   Normal affect.   Labs on Admission: I have personally reviewed following labs and imaging studies  CBC: Recent Labs  Lab 04/01/18 1923  WBC 13.2*  HGB 13.2  HCT 41.2  MCV 90.0  PLT 520   Basic Metabolic Panel: Recent Labs  Lab 04/01/18 1923  NA 135  K 4.3  CL 101  CO2 25  GLUCOSE 148*  BUN 13  CREATININE 0.86  CALCIUM 10.4*   GFR: CrCl cannot be calculated (Unknown ideal weight.). Liver Function Tests: No results for input(s): AST, ALT, ALKPHOS, BILITOT, PROT, ALBUMIN in the last 168 hours. No results for input(s): LIPASE, AMYLASE in the last 168 hours. No results for input(s): AMMONIA in the last 168 hours. Coagulation Profile: No results for input(s): INR, PROTIME in the last 168 hours. Cardiac Enzymes: No results for input(s): CKTOTAL, CKMB, CKMBINDEX, TROPONINI in the last 168 hours. BNP (last 3 results) No results for input(s): PROBNP in the last 8760 hours. HbA1C: No results for input(s): HGBA1C in the last 72 hours. CBG: No results for input(s): GLUCAP in the last 168 hours. Lipid Profile: No results for input(s): CHOL, HDL, LDLCALC, TRIG, CHOLHDL, LDLDIRECT in the last 72 hours. Thyroid Function Tests: No results for input(s): TSH, T4TOTAL, FREET4, T3FREE, THYROIDAB in the last 72 hours. Anemia Panel: No results for input(s): VITAMINB12, FOLATE, FERRITIN, TIBC, IRON, RETICCTPCT in the last 72 hours. Urine analysis:    Component Value Date/Time   COLORURINE YELLOW 09/03/2016 1048   APPEARANCEUR CLEAR 09/03/2016 1048   LABSPEC 1.015 09/03/2016 1048   PHURINE 6.0 09/03/2016 1048   GLUCOSEU NEGATIVE 09/03/2016 1048   HGBUR NEGATIVE 09/03/2016 1048   HGBUR negative 10/01/2007 1025   BILIRUBINUR NEGATIVE 09/03/2016 1048   BILIRUBINUR n 11/03/2015 [place] 09/03/2016 1048   PROTEINUR n 11/03/2015 1639   UROBILINOGEN 0.2 09/03/2016 1048   NITRITE NEGATIVE 09/03/2016 1048   LEUKOCYTESUR NEGATIVE 09/03/2016 1048   Sepsis Labs:  _2 (procalcitonin:4,lacticidven:4) )No results found for this or  any previous visit (from the past 240 hour(s)).   Radiological Exams on Admission: Dg Chest 2 View  Result Date: 04/01/2018 CLINICAL DATA:  78 year old female with acute chest pain at 1800 hours. EXAM: CHEST - 2 VIEW COMPARISON:  Chest radiographs 07/07/2017 and earlier. FINDINGS: Chronic tortuosity of the thoracic aorta appears stable. Other mediastinal contours are within normal limits. Lung volumes are within normal limits. Both lungs appear clear. No pneumothorax or pleural effusion. Visualized tracheal air column is within normal limits. Stable abdominal surgical clips. Negative visible bowel gas pattern. Partially visible spinal and left shoulder hardware. No acute osseous abnormality identified. IMPRESSION: No acute cardiopulmonary abnormality. Electronically Signed   By: Genevie Ann M.D.   On: 04/01/2018 20:05    EKG: Independently reviewed.  Normal sinus rhythm with nonspecific ST-T changes incomplete LBBB.  Assessment/Plan Principal Problem:   Chest pain Active Problems:   Hypothyroidism   Well controlled type 2 diabetes mellitus ([place])   Essential hypertension   History of gastric bypass   Morbid obesity ([place])    1. Chest pain -has had a low risk stress test in December 78 year old patient has risk factors we will cycle cardiac markers and consult cardiology.  On aspirin.  PRN nitroglycerin.  Patient takes Lipitor once a week. 2. Hypertension on amlodipine. 3. Recently diagnosed polymyalgia rheumatica on prednisone. 4. History of allergies on Singulair. 5. Hypothyroidism on thyroid replacement which I have discussed with pharmacy to dose. 6. Diabetes mellitus type 2 on diet. 7. History of gastric bypass. 8. Morbid obesity. 9. Leukocytosis likely from recent starting of steroids.   DVT prophylaxis: Lovenox. Code Status: Full code. Family Communication: Discussed with patient. Disposition Plan: Home.  Consults called: Cardiology. Admission status: Observation.   Rise Patience MD [facility] Pager [phone].  If 7PM-7AM, please contact night-coverage [email or web address] Password [facility]  04/02/2018, 5:35 AM

## 2018-04-02 NOTE — ED Notes (Signed)
ED TO INPATIENT HANDOFF REPORT  ED Nurse Name and Phone #: Jess F [phone]  S Name/Age/Gender [name] 78 y.o. female Room/Bed: 032C/032C  Code Status   Code Status: Full Code  Home/SNF/Other Home Patient oriented to: self, place, time and situation Is this baseline? Yes   Triage Complete: Triage complete  Chief Complaint CP  Triage Note Pt bib ems for chest pain that started while walking in her house and radiated to her right jaw. Pt took 324 ASA and 2 Nitros with relief of pain. Pt a.o, nad noted.    Allergies Allergies  Allergen Reactions  . Flexeril [Cyclobenzaprine]     On Pristiq  With possible serotonin reaction to flexeril  . Ace Inhibitors     States after a surgery she was told this. Not sure what the reason was.   . Azithromycin     REACTION: Rash  . Codeine     REACTION: Upset stomach  . Repatha [Evolocumab]     Myalgias    Level of Care/Admitting Diagnosis ED Disposition    ED Disposition Condition Comment   Admit  Hospital Area: [facility] [[id number]]  Level of Care: Telemetry Cardiac [103]  I expect the patient will be discharged within 24 hours: No (not a candidate for 5C-Observation unit)  Diagnosis: Chest pain [517001]  Admitting Physician: Rise Patience [phone]  Attending Physician: Rise Patience [phone]  PT Class (Do Not Modify): Observation [104]  PT Acc Code (Do Not Modify): Observation [10022]       B Medical/Surgery History Past Medical History:  Diagnosis Date  . Anemia   . Arthritis    osteoarthritis - shoulder, knees & hips  . Asthma   . Depression   . Diabetes mellitus without complication (HCC)    no meds  . Hypertension    Dr. Orinda Kenner manages BP, pt. reports MD has not found a need for treatment   . Hypothyroidism   . Sleep difficulties    had sleep study -2009, prior to gastric surgery, told that there was not a need for f/u  . Thyroid disease   . Vitamin B 12 deficiency     Past Surgical History:  Procedure Laterality Date  . ABDOMINAL HYSTERECTOMY    . BARIATRIC SURGERY    . CARDIAC CATHETERIZATION     [facility]- 30 yrs. ago  . CATARACT EXTRACTION, BILATERAL     late 2018  . OOPHORECTOMY    . pantallor arthrodesis with rod placement left foot    . TONSILLECTOMY    . TOTAL SHOULDER ARTHROPLASTY Left 03/12/2012   Procedure: TOTAL SHOULDER ARTHROPLASTY;  Surgeon: Marin Shutter, MD;  Location: [facility];  Service: Orthopedics;  Laterality: Left;     A IV Location/Drains/Wounds Patient Lines/Drains/Airways Status   Active Line/Drains/Airways    Name:   Placement date:   Placement time:   Site:   Days:   Peripheral IV 04/02/18 Right;Upper Forearm   04/02/18    0510    Forearm   less than 1   Incision 03/12/12 Shoulder Left   03/12/12    5916     2212          Intake/Output Last 24 hours No intake or output data in the 24 hours ending 04/02/18 0536  Labs/Imaging Results for orders placed or performed during the hospital encounter of 04/01/18 (from the past 48 hour(s))  Basic metabolic panel     Status: Abnormal   Collection Time:  04/01/18  7:23 PM  Result Value Ref Range   Sodium 135 135 - 145 mmol/L   Potassium 4.3 3.5 - 5.1 mmol/L   Chloride 101 98 - 111 mmol/L   CO2 25 22 - 32 mmol/L   Glucose, Bld 148 (H) 70 - 99 mg/dL   BUN 13 8 - 23 mg/dL   Creatinine, Ser 0.86 0.44 - 1.00 mg/dL   Calcium 10.4 (H) 8.9 - 10.3 mg/dL   GFR calc non Af Amer >60 >60 mL/min   GFR calc Af Amer >60 >60 mL/min   Anion gap 9 5 - 15    Comment: Performed at [facility] [address] [place], [place] [address]  CBC     Status: Abnormal   Collection Time: 04/01/18  7:23 PM  Result Value Ref Range   WBC 13.2 (H) 4.0 - 10.5 K/uL   RBC 4.58 3.87 - 5.11 MIL/uL   Hemoglobin 13.2 12.0 - 15.0 g/dL   HCT 41.2 36.0 - 46.0 %   MCV 90.0 80.0 - 100.0 fL   MCH 28.8 26.0 - 34.0 pg   MCHC 32.0 30.0 - 36.0 g/dL   RDW 14.0 11.5 - 15.5 %   Platelets 279 150 - 400 K/uL    nRBC 0.0 0.0 - 0.2 %    Comment: Performed at [facility], [place] [address] [place], [place] [address]  I-stat troponin, ED     Status: None   Collection Time: 04/01/18  7:36 PM  Result Value Ref Range   Troponin i, poc 0.00 0.00 - 0.08 ng/mL   Comment 3            Comment: Due to the release kinetics of cTnI, a negative result within the first hours of the onset of symptoms does not rule out myocardial infarction with certainty. If myocardial infarction is still suspected, repeat the test at appropriate intervals.    Dg Chest 2 View  Result Date: 04/01/2018 CLINICAL DATA:  78 year old female with acute chest pain at 1800 hours. EXAM: CHEST - 2 VIEW COMPARISON:  Chest radiographs 07/07/2017 and earlier. FINDINGS: Chronic tortuosity of the thoracic aorta appears stable. Other mediastinal contours are within normal limits. Lung volumes are within normal limits. Both lungs appear clear. No pneumothorax or pleural effusion. Visualized tracheal air column is within normal limits. Stable abdominal surgical clips. Negative visible bowel gas pattern. Partially visible spinal and left shoulder hardware. No acute osseous abnormality identified. IMPRESSION: No acute cardiopulmonary abnormality. Electronically Signed   By: Genevie Ann M.D.   On: 04/01/2018 20:05    Pending Labs Unresulted Labs (From admission, onward)    Start     Ordered   04/09/18 0500  Creatinine, serum  (enoxaparin (LOVENOX)    CrCl >/= 30 ml/min)  Weekly,   R    Comments:  while on enoxaparin therapy    04/02/18 0534   04/03/18 9323  Basic metabolic panel  Tomorrow morning,   R     04/02/18 0534   04/03/18 0500  CBC  Tomorrow morning,   R     04/02/18 0534   04/02/18 0535  Troponin I - Now Then Q6H  Now then every 6 hours,   R     04/02/18 0534   04/02/18 0533  CBC  (enoxaparin (LOVENOX)    CrCl >/= 30 ml/min)  Once,   R    Comments:  Baseline for enoxaparin therapy IF NOT ALREADY DRAWN.  Notify MD if  PLT < 100 K.     04/02/18 0534   04/02/18 0533  Creatinine, serum  (enoxaparin (LOVENOX)    CrCl >/= 30 ml/min)  Once,   R    Comments:  Baseline for enoxaparin therapy IF NOT ALREADY DRAWN.    04/02/18 0534          Vitals/Pain Today's Vitals   04/01/18 2131 04/02/18 0500 04/02/18 0500 04/02/18 0530  BP: (!) 150/88 (!) 147/78    Pulse: 64 (!) 54  (!) 53  Resp: 18 14  12   Temp: 98.5 F (36.9 C)     TempSrc: Oral     SpO2: 98% 94%  96%  PainSc:   0-No pain     Isolation Precautions No active isolations  Medications Medications  aspirin tablet 81 mg (has no administration in time range)  HYDROcodone-acetaminophen (NORCO/VICODIN) 5-325 MG per tablet 1 tablet (has no administration in time range)  amLODipine (NORVASC) tablet 10 mg (has no administration in time range)  atorvastatin (LIPITOR) tablet 20 mg (has no administration in time range)  nitroGLYCERIN (NITROSTAT) SL tablet 0.4 mg (has no administration in time range)  desvenlafaxine (PRISTIQ) 24 hr tablet 100 mg (has no administration in time range)  LORazepam (ATIVAN) tablet 0.25-0.5 mg (has no administration in time range)  Se-Tan PLUS 162-115.2-1 MG CAPS 1 capsule (has no administration in time range)  omega-3 acid ethyl esters (LOVAZA) capsule 1 g (has no administration in time range)  albuterol (PROVENTIL HFA;VENTOLIN HFA) 108 (90 Base) MCG/ACT inhaler 1-2 puff (has no administration in time range)  fluticasone (FLONASE) 50 MCG/ACT nasal spray 1 spray (has no administration in time range)  montelukast (SINGULAIR) tablet 10 mg (has no administration in time range)  acetaminophen (TYLENOL) tablet 650 mg (has no administration in time range)    Or  acetaminophen (TYLENOL) suppository 650 mg (has no administration in time range)  ondansetron (ZOFRAN) tablet 4 mg (has no administration in time range)    Or  ondansetron (ZOFRAN) injection 4 mg (has no administration in time range)  enoxaparin (LOVENOX) injection 40 mg (has no  administration in time range)  sodium chloride flush (NS) 0.9 % injection 3 mL (3 mLs Intravenous Given 04/02/18 0511)  acetaminophen (TYLENOL) tablet 650 mg (650 mg Oral Given 04/02/18 0506)    Mobility walks Low fall risk   Focused Assessments Cardiac Assessment Handoff:  Cardiac Rhythm: Normal sinus rhythm Lab Results  Component Value Date   CKTOTAL 21 03/03/2018   No results found for: DDIMER Does the Patient currently have chest pain? No     R Recommendations: See Admitting Provider Note  Report given to:   Additional Notes: n/a

## 2018-04-03 ENCOUNTER — Encounter (HOSPITAL_COMMUNITY): Payer: Self-pay | Admitting: Cardiology

## 2018-04-03 ENCOUNTER — Telehealth: Payer: Self-pay

## 2018-04-03 NOTE — Telephone Encounter (Signed)
Per Chart Review  Admission date:  04/01/2018  Admitting Physician  Rise Patience, MD  Discharge Date:  04/02/2018   Primary MD  Marin Olp, MD  Recommendations for primary care physician for things to follow:     Discharge Diagnosis   Principal Problem:   Chest pain with high risk for cardiac etiology Active Problems:   Hypothyroidism   Well controlled type 2 diabetes mellitus [facility])   Essential hypertension   History of gastric bypass   Morbid obesity ([place])   Hypercholesteremia          Past Medical History:  Diagnosis Date  . Anemia   . Arthritis    osteoarthritis - shoulder, knees & hips  . Asthma   . Depression   . Diabetes mellitus without complication (HCC)    no meds  . Hypertension    Dr. Orinda Kenner manages BP, pt. reports MD has not found a need for treatment   . Hypothyroidism   . Sleep difficulties    had sleep study -2009, prior to gastric surgery, told that there was not a need for f/u  . Thyroid disease   . Vitamin B 12 deficiency          Past Surgical History:  Procedure Laterality Date  . ABDOMINAL HYSTERECTOMY    . BARIATRIC SURGERY    . CARDIAC CATHETERIZATION     [facility]- [facility] yrs. ago  . CATARACT EXTRACTION, BILATERAL     late 2018  . OOPHORECTOMY    . pantallor arthrodesis with rod placement left foot    . TONSILLECTOMY    . TOTAL SHOULDER ARTHROPLASTY Left 03/12/2012   Procedure: TOTAL SHOULDER ARTHROPLASTY;  Surgeon: Marin Shutter, MD;  Location: [facility];  Service: Orthopedics;  Laterality: Left;       HPI  from the history and physical done on the day of admission:    [name] a 78 y.o.femalewithhistory of recently diagnosed polymyalgia rheumatica on prednisone, gastric bypass surgery, diabetes mellitus on diet, hypertension, hypothyroidism presents today after patient started having chest pain last evening at home. Patient states she was walking in the kitchen  when she started having substernal chest pressure nonradiating with no associated shortness of breath diaphoresis or palpitations. Had no nausea or vomiting. She took nitroglycerin but with no relief had to take another one. Following which patient chest pain resolved and came to the ER.  ED Course:In the ER EKG was showing normal sinus rhythm with incomplete LBBB. Chest x-ray unremarkable troponin negative admitted for further management of chest pain. Patient has had a stress test in December 2019 which was low risk.     Hospital Course:   1. Chest pain-has had a low risk stress test in December 2019, but previously had a chest pain episode in June 2019 which noted calcium score of 990.  Patient has risk factors which gave concern for possible ischemia.  Chest pain order set was initiated for which patient was given full dose aspirin.  Nitroglycerin and morphine made available as needed for pain.  Troponin remained negative and EKG did not note any acute ischemic changes.  Cardiology was consulted and recommended cardiac catheterization.   Cardiac catheterization on 3/12 revealed modest CAD involving a small first diagonal, distal LCx and nondominant RCA(see full report below).  No intervention was deemed necessary.  Patient was recommended to continue medical management with follow-up with her cardiologist Dr. Daneen Schick in the outpatient setting. 2. Essential hypertension on amlodipine.  3. Recently diagnosed polymyalgia rheumatica continued on prednisone. 4. History of allergies on Singulair. 5. Hypothyroidism on thyroid replacement which I have discussed with pharmacy to dose. 6. Diabetes mellitus type 2 on diet.  Last hemoglobin A1c 6 on 03/04/2018. 7. History of gastric bypass. 8. Morbid obesity 9. Leukocytosis thought likely from recent starting of steroids as patient without fever or infectious complaints.   Follow UP     Follow-up Information    Belva Crome, MD  Follow up.   Specialty:  Cardiology Contact information: [address] [place] [address] [phone]            Consults obtained: Cardiology Dr. Peter Martinique  Discharge Condition: Stable  Diet and Activity recommendation: See Discharge Instructions below  _________________________________________________________________  Per Telephone Call Transition Care Management Follow-up Telephone Call   Date discharged? 04/02/2018   How have you been since you were released from the hospital? Patient stated she is doing well and recovering.   Do you understand why you were in the hospital? yes   Do you understand the discharge instructions? yes   Where were you discharged to? Home   Items Reviewed:  Medications reviewed: yes  Allergies reviewed: yes  Dietary changes reviewed: yes  Referrals reviewed: yes   Functional Questionnaire:   Activities of Daily Living (ADLs):   She states they are independent in the following: ambulating,feeding,grooming. States they require assistance with the following: dressing,and preparing meals her daughters are helping with needs.   Any transportation issues/concerns?: no   Any patient concerns? no   Confirmed importance and date/time of follow-up visits scheduled yes  Provider Appointment booked with Dr. Yong Channel on 04/07/18 @ 10:00 AM  Confirmed with patient if condition begins to worsen call PCP or go to the ER.  Patient was given the office number and encouraged to call back with question or concerns.  : yes

## 2018-04-07 ENCOUNTER — Ambulatory Visit: Payer: Medicare HMO | Admitting: Family Medicine

## 2018-04-07 ENCOUNTER — Encounter: Payer: Self-pay | Admitting: Family Medicine

## 2018-04-07 ENCOUNTER — Other Ambulatory Visit: Payer: Self-pay

## 2018-04-07 ENCOUNTER — Ambulatory Visit (INDEPENDENT_AMBULATORY_CARE_PROVIDER_SITE_OTHER): Payer: Medicare HMO | Admitting: Family Medicine

## 2018-04-07 VITALS — BP 120/76 | HR 67 | Temp 98.0°F | Ht 61.0 in | Wt 192.5 lb

## 2018-04-07 DIAGNOSIS — E785 Hyperlipidemia, unspecified: Secondary | ICD-10-CM | POA: Diagnosis not present

## 2018-04-07 DIAGNOSIS — I10 Essential (primary) hypertension: Secondary | ICD-10-CM

## 2018-04-07 DIAGNOSIS — M353 Polymyalgia rheumatica: Secondary | ICD-10-CM | POA: Diagnosis not present

## 2018-04-07 DIAGNOSIS — E119 Type 2 diabetes mellitus without complications: Secondary | ICD-10-CM | POA: Diagnosis not present

## 2018-04-07 DIAGNOSIS — E1169 Type 2 diabetes mellitus with other specified complication: Secondary | ICD-10-CM

## 2018-04-07 DIAGNOSIS — M159 Polyosteoarthritis, unspecified: Secondary | ICD-10-CM

## 2018-04-07 DIAGNOSIS — E039 Hypothyroidism, unspecified: Secondary | ICD-10-CM

## 2018-04-07 DIAGNOSIS — F3342 Major depressive disorder, recurrent, in full remission: Secondary | ICD-10-CM

## 2018-04-07 LAB — COMPREHENSIVE METABOLIC PANEL
ALBUMIN: 3.7 g/dL (ref 3.5–5.2)
ALT: 20 U/L (ref 0–35)
AST: 14 U/L (ref 0–37)
Alkaline Phosphatase: 87 U/L (ref 39–117)
BUN: 13 mg/dL (ref 6–23)
CO2: 31 mEq/L (ref 19–32)
Calcium: 10.3 mg/dL (ref 8.4–10.5)
Chloride: 102 mEq/L (ref 96–112)
Creatinine, Ser: 0.83 mg/dL (ref 0.40–1.20)
GFR: 66.48 mL/min (ref >60.00)
Glucose, Bld: 128 mg/dL — ABNORMAL HIGH (ref 70–99)
Potassium: 4.2 mEq/L (ref 3.5–5.1)
Sodium: 140 mEq/L (ref 135–145)
Total Bilirubin: 0.8 mg/dL (ref 0.2–1.2)
Total Protein: 6.1 g/dL (ref 6.0–8.3)

## 2018-04-07 LAB — CBC WITH DIFFERENTIAL/PLATELET
Basophils Absolute: 0.1 10*3/uL (ref 0.0–0.1)
Basophils Relative: 0.9 % (ref 0.0–3.0)
Eosinophils Absolute: 0.3 10*3/uL (ref 0.0–0.7)
Eosinophils Relative: 2.8 % (ref 0.0–5.0)
HCT: 41.6 % (ref 36.0–46.0)
HEMOGLOBIN: 13.7 g/dL (ref 12.0–15.0)
Lymphocytes Relative: 17 % (ref 12.0–46.0)
Lymphs Abs: 1.6 10*3/uL (ref 0.7–4.0)
MCHC: 33 g/dL (ref 30.0–36.0)
MCV: 90.4 fl (ref 78.0–100.0)
Monocytes Absolute: 0.7 10*3/uL (ref 0.1–1.0)
Monocytes Relative: 7 % (ref 3.0–12.0)
Neutro Abs: 6.9 10*3/uL (ref 1.4–7.7)
Neutrophils Relative %: 72.3 % (ref 43.0–77.0)
Platelets: 222 10*3/uL (ref 150.0–400.0)
RBC: 4.6 Mil/uL (ref 3.87–5.11)
RDW: 15.1 % (ref 11.5–15.5)
WBC: 9.6 10*3/uL (ref 4.0–10.5)

## 2018-04-07 LAB — LIPID PANEL
Cholesterol: 172 mg/dL (ref 0–200)
HDL: 73.7 mg/dL (ref >39.00)
LDL Cholesterol: 70 mg/dL (ref 0–99)
NonHDL: 97.91
Total CHOL/HDL Ratio: 2
Triglycerides: 141 mg/dL (ref 0.0–149.0)
VLDL: 28.2 mg/dL (ref 0.0–40.0)

## 2018-04-07 NOTE — Progress Notes (Signed)
Phone [phone]   Subjective:  [name] is a 78 y.o. year old very pleasant female patient who presents for transitional care management and hospital follow up for chest pain. Patient was hospitalized from April 01, 2018 to April 02, 2018. A TCM phone call was completed on April 03, 2018. Medical complexity moderate  78 year old female with multiple medical problems including hypertension, diabetes, history gastric bypass, hypothyroidism, recent diagnosis of polymyalgia rheumatica who presented to the hospital with substernal chest pain while walking in her kitchen.  She did not have shortness of breath, diaphoresis, palpitations, nausea, vomiting.  Took a nitroglycerin without relief and took another 1 and then called emergency services- this resolved symptoms.  Initial EKG with sinus rhythm and incomplete left bundle branch block.  Chest x-ray was unremarkable.  Troponins were negative.  Stress test December 2019 was low risk  Due to prior high calcium score of 990 despite reassuring stress test in December 2019-there was concern for ischemia.  Cardiology was consulted and recommended cardiac catheterization.  Catheterization on March 12 revealed modest coronary artery disease involving small first diagonal, distal left circumflex and nondominant RCA.  No intervention was deemed necessary.  Medical management was recommended. - has follow up with Dr. Tamala Julian on first of April   For hyperlipidemia-she was continued on atorvastatin once a week- also taking aspirin.  She has not continued her home Repatha- she wants to discuss with Dr. Tamala Julian before deciding on repatha (has 2 good until April 2022)- possibility of switching as well   Her hypertension remained stable in the hospital on amlodipine.  Her PMR-she was continued on prednisone.  She did have leukocytosis related to steroid use. Knees and shoulders still bothering her though had improved some. Using hydrocodone once every other day.  Overall achiness and weakness are much improved. -had a right wrist injection for severe injection which could also raise wbc  Her allergies-she was continued on Singulair.  Her hypothyroidism-she was continued on thyroid replacement.  Her diabetes-she was maintained with diet control.  Status post gastric bypass as primary treatment Lab Results  Component Value Date   HGBA1C 6.0 03/03/2018      See problem oriented charting as well ROS- since getting out of hospital- no chest pain . No shortness of breath. Stable swelling in left leg (has been chronic issue after fusion of ankle). Having back pain, knee pain, shoulder pain and right wrist pain) .  Past Medical History-  Patient Active Problem List   Diagnosis Date Noted   CAD (coronary artery disease) 11/19/2017    Priority: High   Hypercalcemia 12/29/2014    Priority: High   Osteoarthritis 10/05/2013    Priority: High   Well controlled type 2 diabetes mellitus ([place]) 07/18/2006    Priority: High   Overactive bladder 09/03/2016    Priority: Medium   Morbid obesity ([place]) 06/28/2015    Priority: Medium   History of gastric bypass 02/08/2014    Priority: Medium   Hyperlipidemia associated with type 2 diabetes mellitus ([place]) 01/05/2008    Priority: Medium   Hypothyroidism 09/09/2007    Priority: Medium   Major depression, recurrent, full remission ([place]) 07/18/2006    Priority: Medium   Essential hypertension 07/18/2006    Priority: Medium   ASTHMA 07/18/2006    Priority: Medium   Allergic rhinitis 07/20/2014    Priority: Low   CHEST WALL PAIN, ANTERIOR 10/09/2007    Priority: Low   Chest pain with high risk for cardiac etiology  04/02/2018   Hypercholesteremia    Anxiety 07/17/2017   Osteopenia of left hip 07/17/2017    Medications- reviewed and updated  A medical reconciliation was performed comparing current medicines to hospital discharge medications. Current Outpatient Medications  Medication  Sig Dispense Refill   acetaminophen (TYLENOL) 500 MG tablet Take 1,000 mg by mouth every 8 (eight) hours as needed for mild pain or headache.     albuterol (PROVENTIL HFA;VENTOLIN HFA) 108 (90 Base) MCG/ACT inhaler Inhale 1-2 puffs into the lungs every 4 (four) hours as needed for wheezing or shortness of breath. 1 Inhaler 6   Alcohol Swabs PADS Use to check blood sugar once a day 100 each 11   amLODipine (NORVASC) 10 MG tablet Take 10 mg by mouth every evening.     aspirin 81 MG tablet Take 81 mg by mouth daily.     atorvastatin (LIPITOR) 20 MG tablet TAKE 1 TABLET ONE TIME WEEKLY (Patient taking differently: Take 20 mg by mouth every Friday. ) 13 tablet 3   Blood Glucose Calibration (TRUE METRIX LEVEL 2) Normal SOLN Use to calibrate meter as needed 1 each 0   Blood Glucose Monitoring Suppl (TRUE METRIX AIR GLUCOSE METER) w/Device KIT USE AS DIRECTED 1 kit 0   Cholecalciferol (VITAMIN D) 1000 UNITS capsule Take 5,000 Units by mouth daily.      cyanocobalamin (,VITAMIN B-12,) 1000 MCG/ML injection Inject 1 mL (1,000 mcg total) into the muscle every 30 (thirty) days. Uses on amonthly basis 10 mL 2   desvenlafaxine (PRISTIQ) 100 MG 24 hr tablet TAKE 1 TABLET EVERY DAY (Patient taking differently: Take 100 mg by mouth daily. ) 90 tablet 3   fluticasone (FLONASE) 50 MCG/ACT nasal spray USE 1 SPRAY IN EACH NOSTRIL EVERY DAY (Patient taking differently: Place 1 spray into both nostrils daily. ) 32 g 3   glucose blood test strip Test blood sugar once per day 100 each 3   HYDROcodone-acetaminophen (NORCO/VICODIN) 5-325 MG tablet Take 1 tablet by mouth every 6 (six) hours as needed (may refill on 03/22/2018). (Patient taking differently: Take 1 tablet by mouth every 6 (six) hours as needed for moderate pain (may refill on 03/22/2018). ) 30 tablet 0   Levothyroxine Sodium (TIROSINT) 150 MCG CAPS Take 150-300 mcg by mouth every morning. Take 2 capsules by mouth for two days then do not take any on the  third day; on the fourth and Fifth day take 2 capsules. On the sixth day to not take any. Seventh day take 2 capsules on the eight day take 1 capsule and the ninth day do not take any.  Start over after the ninth day. in morning on empty stomach for thyroid (name brand only DAW)     loperamide (IMODIUM) 2 MG capsule Take 4 mg by mouth as needed for diarrhea or loose stools.     LORazepam (ATIVAN) 0.5 MG tablet Take 0.5-1 tablets (0.25-0.5 mg total) by mouth daily as needed for anxiety (do not drive for 8 hours after taking). 10 tablet 0   montelukast (SINGULAIR) 10 MG tablet Take 1 tablet (10 mg total) by mouth daily with breakfast. 90 tablet 3   Omega-3 Fatty Acids (FISH OIL) 1000 MG CAPS Take 1,000 mg by mouth daily.      predniSONE (DELTASONE) 5 MG tablet Take 3 tablets (15 mg total) by mouth daily with breakfast. 90 tablet 5   Propylene Glycol (SYSTANE COMPLETE OP) Apply 1 drop to eye daily as needed (dryness).  TRUEPLUS LANCETS 30G MISC Check blood sugar once per day 100 each 3   FeFum-FePo-FA-B Cmp-C-Zn-Mn-Cu (SE-TAN PLUS) 162-115.2-1 MG CAPS Take 1 capsule by mouth 2 (two) times daily. 180 capsule 3   nitroGLYCERIN (NITROSTAT) 0.4 MG SL tablet Place 1 tablet (0.4 mg total) under the tongue every 5 (five) minutes as needed for chest pain. 25 tablet 6   No current facility-administered medications for this visit.    Objective  Objective:  BP 120/76 (BP Location: Left Arm, Patient Position: Sitting, Cuff Size: Normal)    Pulse 67    Temp 98 F (36.7 C) (Oral)    Ht 5' 1"  (1.549 m)    Wt 192 lb 8 oz (87.3 kg)    SpO2 96%    BMI 36.37 kg/m  Gen: NAD, resting comfortably CV: RRR no murmurs rubs or gallops Lungs: CTAB no crackles, wheeze, rhonchi Abdomen: soft/nontender/nondistended Ext: no edema on right, trace on left (chronic) Skin: warm, dry Neuro: gait and speech normal   Assessment and Plan:   #CAD- nonobstructive CAD-continue medical management with maximally tolerated  statin (upcoming discussion about restarting Repatha with Dr. Tamala Julian) as well as aspirin.  Has cardiology follow-up first week of April  #Hyperlipidemia-patient was concerned Repatha caused many of her issues- the muscle aches and weakness but I suspect this was related to her PMR primarily  #PMR-we will trend down from 15 mg prednisone to 12.5 mg after 1 more week of good control.  See after visit instructions about follow-up  #Arthritis with upcoming joint replacement- she may not be able to have this given current covid-19 environment-I did agree to refill her hydrocodone for every other day use if needed-we will do this without an office visit in current covid-19 environment.  She had a right wrist injection for arthritis which did not cause significant relief of pain unfortunately.  #Hypertension-controlled on amlodipine-continue current medication  #Hypothyroidism-continue current levothyroxine dose-treated by endocrinology  #Diabetes-remains diet controlled-continue without medication at this time  Future Appointments  Date Time Provider [place]  04/22/2018  8:30 AM Burtis Junes, NP [facility]   Try to do most follow-up by my chart if able  Lab/Order associations: had half grapefruit today only.  Hyperlipidemia associated with type 2 diabetes mellitus ([place]) - Plan: CBC with Differential/Platelet, Comprehensive metabolic panel, Lipid panel  Return precautions advised.  Garret Reddish, MD

## 2018-04-07 NOTE — Patient Instructions (Addendum)
Glad diffuse muscle aches and weakness is better- lets go down to 12.5mg  (two full pills and one half pill of prednisone) in another week if your symptoms remain well controlled.   No other changes   I typically would see you back in 4 weeks for PMR- but how about you let me know how things are going by mychart and we will decide what we need to do  Please stop by lab before you go If you do not have mychart- we will call you about results within 5 business days of [place] receiving them.  If you have mychart- we will send your results within 3 business days of [place] receiving them.  If abnormal or we want to clarify a result, we will call or mychart you to make sure you receive the message.  If you have questions or concerns or don't hear within 5-7 days, please send [place] a message or call us.

## 2018-04-08 ENCOUNTER — Encounter: Payer: Self-pay | Admitting: Family Medicine

## 2018-04-08 ENCOUNTER — Other Ambulatory Visit: Payer: Self-pay | Admitting: Family Medicine

## 2018-04-08 NOTE — Assessment & Plan Note (Signed)
#  CAD- nonobstructive CAD-continue medical management with maximally tolerated statin (upcoming discussion about restarting Repatha with Dr. Tamala Julian) as well as aspirin.  Has cardiology follow-up first week of April

## 2018-04-08 NOTE — Assessment & Plan Note (Signed)
#  PMR-we will trend down from 15 mg prednisone to 12.5 mg after 1 more week of good control.  See after visit instructions about follow-up

## 2018-04-08 NOTE — Assessment & Plan Note (Signed)
#  Hypertension-controlled on amlodipine-continue current medication

## 2018-04-08 NOTE — Assessment & Plan Note (Signed)
#  Diabetes-remains diet controlled-continue without medication at this time

## 2018-04-08 NOTE — Assessment & Plan Note (Signed)
#  Hypothyroidism-continue current levothyroxine dose-treated by endocrinology

## 2018-04-08 NOTE — Assessment & Plan Note (Signed)
#  Arthritis with upcoming joint replacement- she may not be able to have this given current covid-19 environment-I did agree to refill her hydrocodone for every other day use if needed-we will do this without an office visit in current covid-19 environment.  She had a right wrist injection for arthritis which did not cause significant relief of pain unfortunately.

## 2018-04-08 NOTE — Telephone Encounter (Signed)
Looks like rx for new meter was sent in on 03/31/2018.

## 2018-04-08 NOTE — Assessment & Plan Note (Signed)
#  Hyperlipidemia-patient was concerned Repatha caused many of her issues- the muscle aches and weakness but I suspect this was related to her PMR primarily

## 2018-04-15 ENCOUNTER — Telehealth: Payer: Self-pay | Admitting: Nurse Practitioner

## 2018-04-15 NOTE — Telephone Encounter (Signed)
   Primary Cardiologist:  Sinclair Grooms, MD   Patient contacted.  History reviewed.  She is post cath - no problems with her site. She has no symptoms to suggest any unstable cardiac conditions.  Based on discussion, with current pandemic situation, we will be postponing this appointment for [name] with a plan to keep her recall with Dr. Tamala Julian as planned for the fall of 2020 or sooner if feasible/necessary.  If symptoms change, she has been instructed to contact our office.    Truitt Merle, NP  04/15/2018 12:49 PM         .

## 2018-04-22 ENCOUNTER — Ambulatory Visit: Payer: Medicare HMO | Admitting: Nurse Practitioner

## 2018-05-04 ENCOUNTER — Encounter: Payer: Self-pay | Admitting: Family Medicine

## 2018-05-04 NOTE — Telephone Encounter (Signed)
Please see message . Thank you .

## 2018-05-11 ENCOUNTER — Other Ambulatory Visit: Payer: Self-pay

## 2018-05-11 MED ORDER — TRUEPLUS LANCETS 30G MISC: 3 refills | Status: DC

## 2018-05-22 DIAGNOSIS — H6123 Impacted cerumen, bilateral: Secondary | ICD-10-CM | POA: Diagnosis not present

## 2018-05-25 ENCOUNTER — Ambulatory Visit (INDEPENDENT_AMBULATORY_CARE_PROVIDER_SITE_OTHER): Payer: Medicare HMO | Admitting: Family Medicine

## 2018-05-25 ENCOUNTER — Encounter: Payer: Self-pay | Admitting: Family Medicine

## 2018-05-25 VITALS — Ht 61.0 in | Wt 204.0 lb

## 2018-05-25 DIAGNOSIS — M353 Polymyalgia rheumatica: Secondary | ICD-10-CM | POA: Diagnosis not present

## 2018-05-25 DIAGNOSIS — M159 Polyosteoarthritis, unspecified: Secondary | ICD-10-CM

## 2018-05-25 DIAGNOSIS — I10 Essential (primary) hypertension: Secondary | ICD-10-CM

## 2018-05-25 MED ORDER — PREDNISONE 5 MG PO TABS: 5.0000 mg | ORAL_TABLET | Freq: Every day | ORAL | 0 refills | Status: DC

## 2018-05-25 MED ORDER — PREDNISONE 1 MG PO TABS: 1.0000 mg | ORAL_TABLET | Freq: Every day | ORAL | 1 refills | Status: DC

## 2018-05-25 MED ORDER — HYDROCODONE-ACETAMINOPHEN 5-325 MG PO TABS: 1.0000 | ORAL_TABLET | Freq: Four times a day (QID) | ORAL | 0 refills | Status: DC | PRN

## 2018-05-25 NOTE — Telephone Encounter (Signed)
I spoke with patient and she agreed to do a Evisit with Dr. Yong Channel today.

## 2018-05-25 NOTE — Progress Notes (Signed)
Phone [phone]   Subjective:  Virtual visit via Video note. Chief complaint: Chief Complaint  Patient presents with  . Medication Refill- PMR    This visit type was conducted due to national recommendations for restrictions regarding the COVID-19 Pandemic (e.g. social distancing).  This format is felt to be most appropriate for this patient at this time balancing risks to patient and risks to population by having him in for in person visit.  No physical exam was performed (except for noted visual exam or audio findings with Telehealth visits).    Our team/I connected with [name] on 05/26/18 at  4:00 PM EDT by a video enabled telemedicine application (doxy.me) and verified that I am speaking with the correct person using two identifiers.  Location patient: Home-O2 Location provider: [facility], office Persons participating in the virtual visit:  patient  Our team/I discussed the limitations of evaluation and management by telemedicine and the availability of in person appointments. In light of current covid-19 pandemic, patient also understands that we are trying to protect them by minimizing in office contact if at all possible.  The patient expressed consent for telemedicine visit and agreed to proceed. Patient understands insurance will be billed.   ROS- no fever/chills reported. Continued knee and wrist pain. No worsening stiffness or pain in shoulders or hips   Past Medical History-  Patient Active Problem List   Diagnosis Date Noted  . Polymyalgia rheumatica ([place]) 03/16/2018    Priority: High  . CAD (coronary artery disease) 11/19/2017    Priority: High  . Hypercalcemia 12/29/2014    Priority: High  . Osteoarthritis 10/05/2013    Priority: High  . Well controlled type 2 diabetes mellitus ([place]) 07/18/2006    Priority: High  . Overactive bladder 09/03/2016    Priority: Medium  . Morbid obesity ([place]) 06/28/2015    Priority: Medium  . History of gastric bypass  02/08/2014    Priority: Medium  . Hyperlipidemia associated with type 2 diabetes mellitus ([place]) 01/05/2008    Priority: Medium  . Hypothyroidism 09/09/2007    Priority: Medium  . Major depression, recurrent, full remission ([place]) 07/18/2006    Priority: Medium  . Essential hypertension 07/18/2006    Priority: Medium  . ASTHMA 07/18/2006    Priority: Medium  . Allergic rhinitis 07/20/2014    Priority: Low  . CHEST WALL PAIN, ANTERIOR 10/09/2007    Priority: Low  . Chest pain with high risk for cardiac etiology 04/02/2018  . Hypercholesteremia   . Anxiety 07/17/2017  . Osteopenia of left hip 07/17/2017    Medications- reviewed and updated Current Outpatient Medications  Medication Sig Dispense Refill  . acetaminophen (TYLENOL) 500 MG tablet Take 1,000 mg by mouth every 8 (eight) hours as needed for mild pain or headache.    . albuterol (PROVENTIL HFA;VENTOLIN HFA) 108 (90 Base) MCG/ACT inhaler Inhale 1-2 puffs into the lungs every 4 (four) hours as needed for wheezing or shortness of breath. 1 Inhaler 6  . Alcohol Swabs PADS Use to check blood sugar once a day 100 each 11  . amLODipine (NORVASC) 10 MG tablet Take 10 mg by mouth every evening.    Marland Kitchen aspirin 81 MG tablet Take 81 mg by mouth daily.    Marland Kitchen atorvastatin (LIPITOR) 20 MG tablet TAKE 1 TABLET ONE TIME WEEKLY (Patient taking differently: Take 20 mg by mouth every Friday. ) 13 tablet 3  . Blood Glucose Calibration (TRUE METRIX LEVEL 2) Normal SOLN Use to calibrate meter  as needed 1 each 0  . Blood Glucose Monitoring Suppl (TRUE METRIX AIR GLUCOSE METER) w/Device KIT USE AS DIRECTED 1 kit 0  . Cholecalciferol (VITAMIN D) 1000 UNITS capsule Take 5,000 Units by mouth daily.     . cyanocobalamin (,VITAMIN B-12,) 1000 MCG/ML injection Inject 1 mL (1,000 mcg total) into the muscle every 30 (thirty) days. Uses on amonthly basis 10 mL 2  . desvenlafaxine (PRISTIQ) 100 MG 24 hr tablet TAKE 1 TABLET EVERY DAY (Patient taking differently:  Take 100 mg by mouth daily. ) 90 tablet 3  . FeFum-FePo-FA-B Cmp-C-Zn-Mn-Cu (SE-TAN PLUS) 162-115.2-1 MG CAPS Take 1 capsule by mouth 2 (two) times daily. 180 capsule 3  . fluticasone (FLONASE) 50 MCG/ACT nasal spray USE 1 SPRAY IN EACH NOSTRIL EVERY DAY (Patient taking differently: Place 1 spray into both nostrils daily. ) 32 g 3  . glucose blood test strip Test blood sugar once per day 100 each 3  . HYDROcodone-acetaminophen (NORCO/VICODIN) 5-325 MG tablet Take 1 tablet by mouth every 6 (six) hours as needed (may refill on 05/26/2018). 30 tablet 0  . Levothyroxine Sodium (TIROSINT) 150 MCG CAPS Take 150-300 mcg by mouth every morning. Take 2 capsules by mouth for two days then do not take any on the third day; on the fourth and Fifth day take 2 capsules. On the sixth day to not take any. Seventh day take 2 capsules on the eight day take 1 capsule and the ninth day do not take any.  Start over after the ninth day. in morning on empty stomach for thyroid (name brand only DAW)    . loperamide (IMODIUM) 2 MG capsule Take 4 mg by mouth as needed for diarrhea or loose stools.    Marland Kitchen LORazepam (ATIVAN) 0.5 MG tablet Take 0.5-1 tablets (0.25-0.5 mg total) by mouth daily as needed for anxiety (do not drive for 8 hours after taking). 10 tablet 0  . montelukast (SINGULAIR) 10 MG tablet TAKE 1 TABLET EVERY DAY WITH BREAKFAST 90 tablet 3  . Omega-3 Fatty Acids (FISH OIL) 1000 MG CAPS Take 1,000 mg by mouth daily.     Marland Kitchen Propylene Glycol (SYSTANE COMPLETE OP) Apply 1 drop to eye daily as needed (dryness).     . TRUEplus Lancets 30G MISC Check blood sugar once per day 100 each 3  . nitroGLYCERIN (NITROSTAT) 0.4 MG SL tablet Place 1 tablet (0.4 mg total) under the tongue every 5 (five) minutes as needed for chest pain. 25 tablet 6  . predniSONE (DELTASONE) 1 MG tablet Take 1-4 tablets (1-4 mg total) by mouth daily with breakfast. As directed per taper 150 tablet 1  . predniSONE (DELTASONE) 5 MG tablet Take 1 tablet (5 mg  total) by mouth daily with breakfast. Take along with 1 mg tablets as directed 90 tablet 0   No current facility-administered medications for this visit.      Objective:  Ht _0  (1.549 m)   Wt 204 lb (92.5 kg)   BMI 38.55 kg/m  Gen: NAD, resting comfortably Lungs: nonlabored, normal respiratory rate  Skin: appears dry, no obvious rash     Assessment and Plan   # Polymyalgia Rheumatica S:patient has been able to come down to 76m prednisone over the last month   Knees and right wrist are bothering her- taking pain medicine and takes the edge off. Trial half tablet suggested- he has been pushed back on her knee replacement A/P: Patient doing well titrating off of prednisone- we will taper  by 1 mg every 2 weeks as long as does not have worsening symptoms.  #hypertension S: controlled on last check- still on amlodipine 41m. Does nto have cuff to check BP BP Readings from Last 3 Encounters:  04/07/18 120/76  04/02/18 134/68  03/16/18 110/70  A/P: hopefully remains controlled- continue current medicines- can consider getting home cuff but hopefuly can see each other in person in coming months as well  Osteoarthritis S: Patient still struggling with knee pain- knee replacement pushed out due to current covid 19 pandemic. Does well with nsaids but we want to avoid if possible particularly with CAD history.  A/P: refilled medication- nccsrs reviewed. Asked her to try half tablet daily instead of full tablet daily if at all possible. She is not sure she can tolerate lower dose. Weight loss would also help- obesity noted- she states covid 19 has made weight loss more difficult- being pinned inside and food choices not as open.   Lab/Order associations: Essential hypertension  Polymyalgia rheumatica (HCC)  Osteoarthritis of multiple joints, unspecified osteoarthritis type  Meds ordered this encounter  Medications  . predniSONE (DELTASONE) 5 MG tablet    Sig: Take 1 tablet (5 mg  total) by mouth daily with breakfast. Take along with 1 mg tablets as directed    Dispense:  90 tablet    Refill:  0  . predniSONE (DELTASONE) 1 MG tablet    Sig: Take 1-4 tablets (1-4 mg total) by mouth daily with breakfast. As directed per taper    Dispense:  150 tablet    Refill:  1  . HYDROcodone-acetaminophen (NORCO/VICODIN) 5-325 MG tablet    Sig: Take 1 tablet by mouth every 6 (six) hours as needed (may refill on 05/26/2018).    Dispense:  30 tablet    Refill:  0    OA of the knees-Chronic pain   Return precautions advised.  SGarret Reddish MD

## 2018-05-26 NOTE — Assessment & Plan Note (Signed)
S: Patient still struggling with knee pain- knee replacement pushed out due to current covid 19 pandemic. Does well with nsaids but we want to avoid if possible particularly with CAD history.  A/P: refilled medication- nccsrs reviewed. Asked her to try half tablet daily instead of full tablet daily if at all possible. She is not sure she can tolerate lower dose. Weight loss would also help- obesity noted- she states covid 19 has made weight loss more difficult- being pinned inside and food choices not as open.

## 2018-05-26 NOTE — Assessment & Plan Note (Signed)
S:patient has been able to come down to 10mg  prednisone over the last month   Knees and right wrist are bothering her- taking pain medicine and takes the edge off. Trial half tablet suggested- he has been pushed back on her knee replacement A/P: Patient doing well titrating off of prednisone- we will taper by 1 mg every 2 weeks as long as does not have worsening symptoms.

## 2018-05-26 NOTE — Patient Instructions (Signed)
There are no preventive care reminders to display for this patient.  Depression screen [facility] 2/9 03/03/2018 12/04/2017 11/19/2017  Decreased Interest 1 0 0  Down, Depressed, Hopeless 1 0 1  PHQ - 2 Score 2 0 1  Altered sleeping 0 - 1  Tired, decreased energy 2 - 2  Change in appetite 0 - 0  Feeling bad or failure about yourself  0 - 0  Trouble concentrating 1 - 0  Moving slowly or fidgety/restless 0 - 0  Suicidal thoughts 0 - 0  PHQ-9 Score 5 - 4  Difficult doing work/chores Very difficult - Somewhat difficult  Some recent data might be hidden   Video visit

## 2018-06-03 ENCOUNTER — Telehealth: Payer: Self-pay | Admitting: Pharmacist

## 2018-06-03 NOTE — Telephone Encounter (Signed)
Pt called back to clinic - she spoke with [employer] who stated she had previously called them to cancel her shipments so she will need another SNF application faxed over. WIll plan to fax over form used earlier this year since it should still be active. Will add in case # [phone] to the top of her application.

## 2018-06-03 NOTE — Telephone Encounter (Signed)
Called pt to discuss trying Nexletol for her cholesterol. She previously took [facility] and thought it was causing muscle aches, however lab work from PCP showed an elevated sedimentation rate that suggested polymyalgia rheumatica. Pt would like to try her Repatha shots again to see how she tolerates them. She still has 2 left at home and was receiving her med from the [facility]. She will call us in 1 month to let us know how she is tolerating Repatha.

## 2018-06-07 ENCOUNTER — Encounter: Payer: Self-pay | Admitting: Family Medicine

## 2018-06-22 ENCOUNTER — Other Ambulatory Visit: Payer: Self-pay | Admitting: Pharmacist

## 2018-06-22 MED ORDER — EVOLOCUMAB 140 MG/ML ~~LOC~~ SOAJ: 1.0000 "pen " | SUBCUTANEOUS | 3 refills | Status: DC

## 2018-06-22 MED ORDER — EVOLOCUMAB 140 MG/ML ~~LOC~~ SOAJ: 1.0000 "pen " | SUBCUTANEOUS | 0 refills | Status: DC

## 2018-06-29 DIAGNOSIS — E063 Autoimmune thyroiditis: Secondary | ICD-10-CM | POA: Diagnosis not present

## 2018-06-29 DIAGNOSIS — E210 Primary hyperparathyroidism: Secondary | ICD-10-CM | POA: Diagnosis not present

## 2018-06-29 DIAGNOSIS — E038 Other specified hypothyroidism: Secondary | ICD-10-CM | POA: Diagnosis not present

## 2018-06-29 DIAGNOSIS — E559 Vitamin D deficiency, unspecified: Secondary | ICD-10-CM | POA: Diagnosis not present

## 2018-06-30 ENCOUNTER — Encounter: Payer: Self-pay | Admitting: Family Medicine

## 2018-07-01 NOTE — Telephone Encounter (Signed)
Please see message . Thank you .

## 2018-07-02 DIAGNOSIS — E210 Primary hyperparathyroidism: Secondary | ICD-10-CM | POA: Diagnosis not present

## 2018-07-02 DIAGNOSIS — E119 Type 2 diabetes mellitus without complications: Secondary | ICD-10-CM | POA: Diagnosis not present

## 2018-07-02 DIAGNOSIS — Z9884 Bariatric surgery status: Secondary | ICD-10-CM | POA: Diagnosis not present

## 2018-07-02 DIAGNOSIS — E038 Other specified hypothyroidism: Secondary | ICD-10-CM | POA: Diagnosis not present

## 2018-07-02 DIAGNOSIS — M8580 Other specified disorders of bone density and structure, unspecified site: Secondary | ICD-10-CM | POA: Diagnosis not present

## 2018-07-02 DIAGNOSIS — E559 Vitamin D deficiency, unspecified: Secondary | ICD-10-CM | POA: Diagnosis not present

## 2018-07-02 DIAGNOSIS — E063 Autoimmune thyroiditis: Secondary | ICD-10-CM | POA: Diagnosis not present

## 2018-07-12 ENCOUNTER — Other Ambulatory Visit: Payer: Self-pay | Admitting: Family Medicine

## 2018-08-26 ENCOUNTER — Encounter: Payer: Self-pay | Admitting: Family Medicine

## 2018-08-28 ENCOUNTER — Encounter: Payer: Self-pay | Admitting: Family Medicine

## 2018-08-28 NOTE — Telephone Encounter (Signed)
Pt also called the office with the same concerns. Pt was advised by front office staff that this would be a VV. No further action needed!

## 2018-08-30 IMAGING — CR DG CHEST 2V
2 series · 2 of 2 positions shown · non-contrast
Comparison: 01/22/2014

CLINICAL DATA: Chest pain for several hours

EXAM:
CHEST - 2 VIEW

[chest pa]
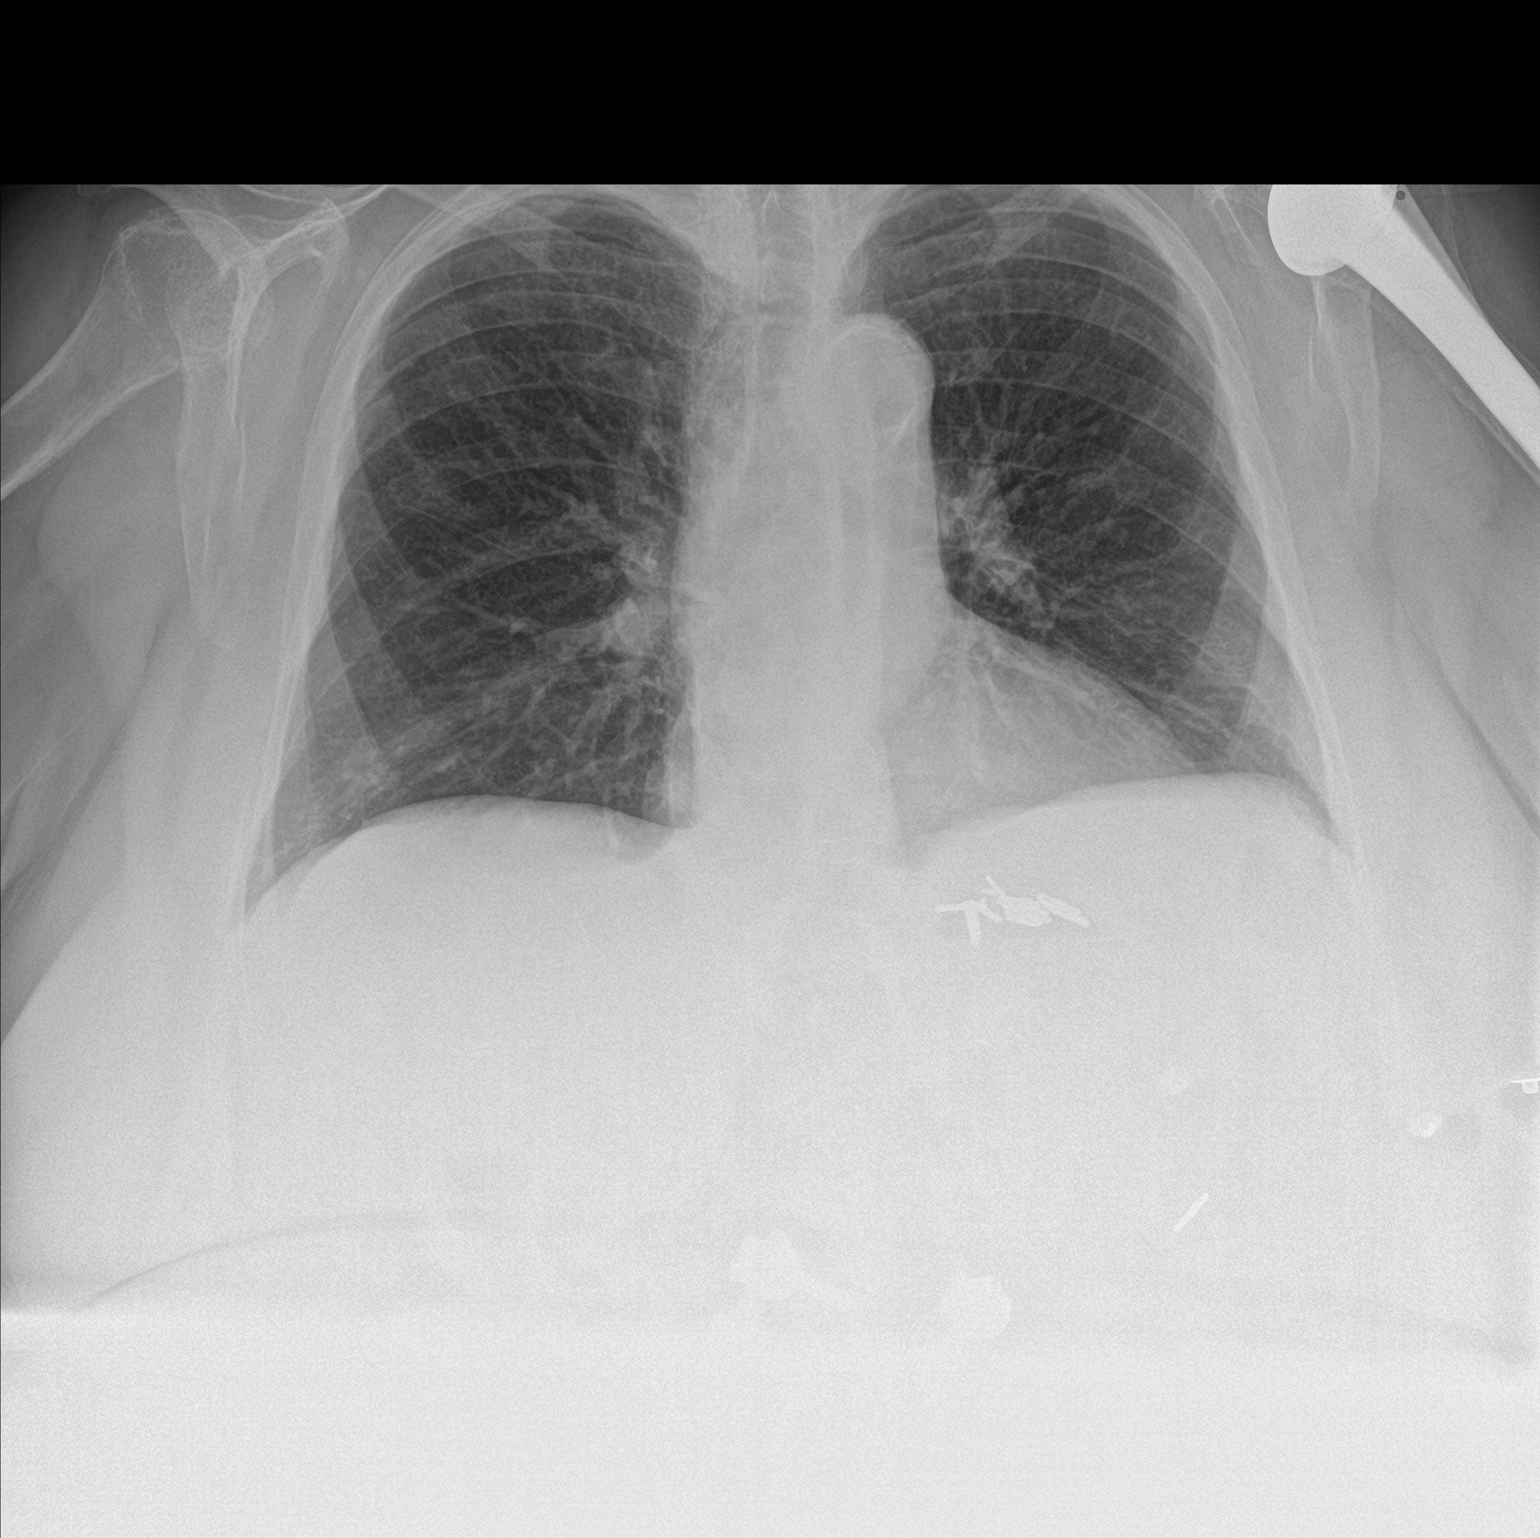

[chest lat]
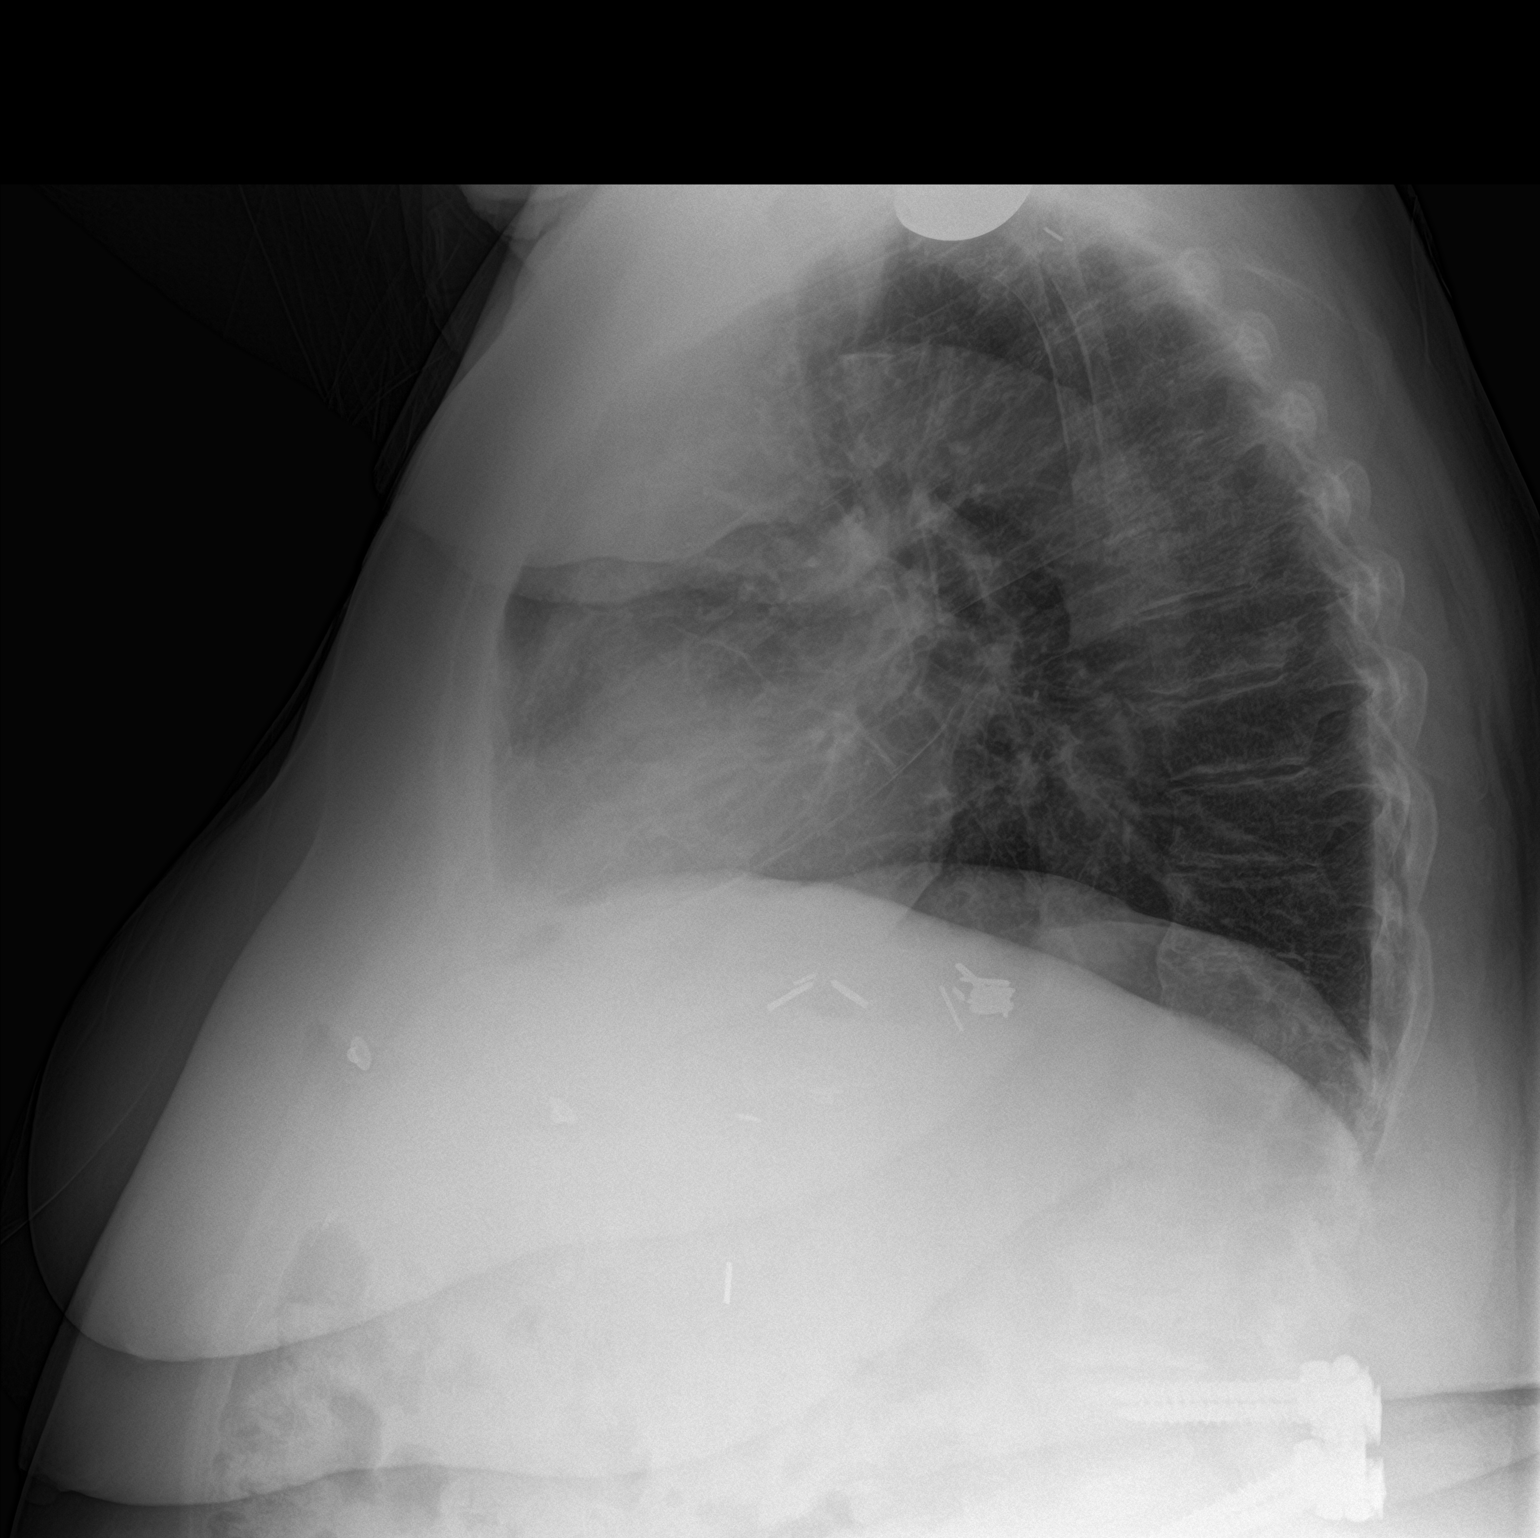

[2 of 2 positions shown; findings below may reference images not displayed]

FINDINGS: Cardiac shadow is within normal limits. Aortic calcifications are
noted. The lungs are well aerated bilaterally without focal
infiltrate or sizable effusion. Postsurgical changes in the left
shoulder are again noted.
IMPRESSION: No active cardiopulmonary disease.

## 2018-08-31 ENCOUNTER — Encounter: Payer: Self-pay | Admitting: Family Medicine

## 2018-08-31 ENCOUNTER — Ambulatory Visit (INDEPENDENT_AMBULATORY_CARE_PROVIDER_SITE_OTHER): Payer: Medicare HMO | Admitting: Family Medicine

## 2018-08-31 VITALS — Temp 97.8°F | Ht 61.0 in | Wt 205.0 lb

## 2018-08-31 DIAGNOSIS — E1169 Type 2 diabetes mellitus with other specified complication: Secondary | ICD-10-CM | POA: Diagnosis not present

## 2018-08-31 DIAGNOSIS — I25118 Atherosclerotic heart disease of native coronary artery with other forms of angina pectoris: Secondary | ICD-10-CM | POA: Diagnosis not present

## 2018-08-31 DIAGNOSIS — M159 Polyosteoarthritis, unspecified: Secondary | ICD-10-CM | POA: Diagnosis not present

## 2018-08-31 DIAGNOSIS — E1159 Type 2 diabetes mellitus with other circulatory complications: Secondary | ICD-10-CM | POA: Diagnosis not present

## 2018-08-31 DIAGNOSIS — Z6838 Body mass index (BMI) 38.0-38.9, adult: Secondary | ICD-10-CM | POA: Diagnosis not present

## 2018-08-31 DIAGNOSIS — I152 Hypertension secondary to endocrine disorders: Secondary | ICD-10-CM

## 2018-08-31 DIAGNOSIS — M353 Polymyalgia rheumatica: Secondary | ICD-10-CM

## 2018-08-31 DIAGNOSIS — I10 Essential (primary) hypertension: Secondary | ICD-10-CM

## 2018-08-31 DIAGNOSIS — E785 Hyperlipidemia, unspecified: Secondary | ICD-10-CM | POA: Diagnosis not present

## 2018-08-31 DIAGNOSIS — E119 Type 2 diabetes mellitus without complications: Secondary | ICD-10-CM | POA: Diagnosis not present

## 2018-08-31 MED ORDER — HYDROCODONE-ACETAMINOPHEN 5-325 MG PO TABS: 1.0000 | ORAL_TABLET | Freq: Two times a day (BID) | ORAL | 0 refills | Status: DC | PRN

## 2018-08-31 NOTE — Patient Instructions (Signed)
Health Maintenance Due  Topic Date Due  . INFLUENZA VACCINE  08/22/2018    Depression screen [facility] 2/9 03/03/2018 12/04/2017 11/19/2017  Decreased Interest 1 0 0  Down, Depressed, Hopeless 1 0 1  PHQ - 2 Score 2 0 1  Altered sleeping 0 - 1  Tired, decreased energy 2 - 2  Change in appetite 0 - 0  Feeling bad or failure about yourself  0 - 0  Trouble concentrating 1 - 0  Moving slowly or fidgety/restless 0 - 0  Suicidal thoughts 0 - 0  PHQ-9 Score 5 - 4  Difficult doing work/chores Very difficult - Somewhat difficult  Some recent data might be hidden

## 2018-08-31 NOTE — Progress Notes (Signed)
Phone [phone]   Subjective:  Virtual visit via Video note. Chief complaint: Chief Complaint  Patient presents with   Follow-up   Diabetes   Hypertension   Anxiety   Depression   This visit type was conducted due to national recommendations for restrictions regarding the COVID-19 Pandemic (e.g. social distancing).  This format is felt to be most appropriate for this patient at this time balancing risks to patient and risks to population by having him in for in person visit.  No physical exam was performed (except for noted visual exam or audio findings with Telehealth visits).   Our team/I connected with [name] at  8:00 AM EDT by a video enabled telemedicine application (doxy.me or caregility through epic) and verified that I am speaking with the correct person using two identifiers.  Location patient: Home-O2 Location provider: [facility], office Persons participating in the virtual visit:  patient  Our team/I discussed the limitations of evaluation and management by telemedicine and the availability of in person appointments. In light of current covid-19 pandemic, patient also understands that we are trying to protect them by minimizing in office contact if at all possible.  The patient expressed consent for telemedicine visit and agreed to proceed. Patient understands insurance will be billed.   ROS- Denies HA, dizziness, CP, SOB, visual changes.    Past Medical History-  Patient Active Problem List   Diagnosis Date Noted   Polymyalgia rheumatica ([place]) 03/16/2018    Priority: High   CAD (coronary artery disease) 11/19/2017    Priority: High   Hypercalcemia 12/29/2014    Priority: High   Osteoarthritis 10/05/2013    Priority: High   Well controlled type 2 diabetes mellitus ([place]) 07/18/2006    Priority: High   Overactive bladder 09/03/2016    Priority: Medium   Morbid obesity ([place]) 06/28/2015    Priority: Medium   History of gastric bypass  02/08/2014    Priority: Medium   Hyperlipidemia associated with type 2 diabetes mellitus ([place]) 01/05/2008    Priority: Medium   Hypothyroidism 09/09/2007    Priority: Medium   Major depression, recurrent, full remission ([place]) 07/18/2006    Priority: Medium   Hypertension associated with diabetes ([place]) 07/18/2006    Priority: Medium   ASTHMA 07/18/2006    Priority: Medium   Allergic rhinitis 07/20/2014    Priority: Low   CHEST WALL PAIN, ANTERIOR 10/09/2007    Priority: Low   Coronary artery disease of native artery of native heart with stable angina pectoris ([place]) 08/31/2018   Chest pain with high risk for cardiac etiology 04/02/2018   Hypercholesteremia    Anxiety 07/17/2017   Osteopenia of left hip 07/17/2017    Medications- reviewed and updated Current Outpatient Medications  Medication Sig Dispense Refill   acetaminophen (TYLENOL) 500 MG tablet Take 1,000 mg by mouth every 8 (eight) hours as needed for mild pain or headache.     albuterol (PROVENTIL HFA;VENTOLIN HFA) 108 (90 Base) MCG/ACT inhaler Inhale 1-2 puffs into the lungs every 4 (four) hours as needed for wheezing or shortness of breath. 1 Inhaler 6   Alcohol Swabs PADS Use to check blood sugar once a day 100 each 11   amLODipine (NORVASC) 10 MG tablet Take 10 mg by mouth every evening.     aspirin 81 MG tablet Take 81 mg by mouth daily.     atorvastatin (LIPITOR) 20 MG tablet TAKE 1 TABLET ONE TIME WEEKLY (Patient taking differently: Take 20 mg by mouth  every Friday. ) 13 tablet 3   Blood Glucose Calibration (TRUE METRIX LEVEL 2) Normal SOLN Use to calibrate meter as needed 1 each 0   Blood Glucose Monitoring Suppl (TRUE METRIX AIR GLUCOSE METER) w/Device KIT USE AS DIRECTED 1 kit 0   Cholecalciferol (VITAMIN D) 1000 UNITS capsule Take 5,000 Units by mouth daily.      cyanocobalamin (,VITAMIN B-12,) 1000 MCG/ML injection Inject 1 mL (1,000 mcg total) into the muscle every 30 (thirty) days. Uses on  amonthly basis 10 mL 2   desvenlafaxine (PRISTIQ) 100 MG 24 hr tablet TAKE 1 TABLET EVERY DAY (Patient taking differently: Take 100 mg by mouth daily. ) 90 tablet 3   Evolocumab (REPATHA SURECLICK) 326 MG/ML SOAJ Inject 1 pen into the skin every 14 (fourteen) days. 6 pen 3   FeFum-FePo-FA-B Cmp-C-Zn-Mn-Cu (SE-TAN PLUS) 162-115.2-1 MG CAPS Take 1 capsule by mouth twice daily 180 capsule 0   fluticasone (FLONASE) 50 MCG/ACT nasal spray USE 1 SPRAY IN EACH NOSTRIL EVERY DAY (Patient taking differently: Place 1 spray into both nostrils daily. ) 32 g 3   glucose blood test strip Test blood sugar once per day 100 each 3   HYDROcodone-acetaminophen (NORCO/VICODIN) 5-325 MG tablet Take 1 tablet by mouth 2 (two) times daily as needed for moderate pain or severe pain. 60 tablet 0   Levothyroxine Sodium (TIROSINT) 150 MCG CAPS Take 150-300 mcg by mouth every morning. Take 2 capsules by mouth for two days then do not take any on the third day; on the fourth and Fifth day take 2 capsules. On the sixth day to not take any. Seventh day take 2 capsules on the eight day take 1 capsule and the ninth day do not take any.  Start over after the ninth day. in morning on empty stomach for thyroid (name brand only DAW)     loperamide (IMODIUM) 2 MG capsule Take 4 mg by mouth as needed for diarrhea or loose stools.     LORazepam (ATIVAN) 0.5 MG tablet Take 0.5-1 tablets (0.25-0.5 mg total) by mouth daily as needed for anxiety (do not drive for 8 hours after taking). 10 tablet 0   montelukast (SINGULAIR) 10 MG tablet TAKE 1 TABLET EVERY DAY WITH BREAKFAST 90 tablet 3   nitroGLYCERIN (NITROSTAT) 0.4 MG SL tablet Place 1 tablet (0.4 mg total) under the tongue every 5 (five) minutes as needed for chest pain. 25 tablet 6   Omega-3 Fatty Acids (FISH OIL) 1000 MG CAPS Take 1,000 mg by mouth daily.      predniSONE (DELTASONE) 1 MG tablet Take 1-4 tablets (1-4 mg total) by mouth daily with breakfast. As directed per taper  150 tablet 1   Propylene Glycol (SYSTANE COMPLETE OP) Apply 1 drop to eye daily as needed (dryness).      TRUEplus Lancets 30G MISC Check blood sugar once per day 100 each 3   HYDROcodone-acetaminophen (NORCO/VICODIN) 5-325 MG tablet Take 1 tablet by mouth 2 (two) times daily as needed for moderate pain or severe pain (may refill in 1 month). 60 tablet 0   No current facility-administered medications for this visit.      Objective:  Temp 97.8 F (36.6 C)    Ht 5' 1"  (1.549 m)    Wt 205 lb (93 kg)    BMI 38.73 kg/m  self reported vitals Gen: NAD, resting comfortably Lungs: nonlabored, normal respiratory rate  Skin: appears dry, no obvious rash     Assessment and Plan    #  Chronic pain/osteoarthritis of the knees and wrist.  Polymyalgia rheumatica. S:Pain in her knees seems to be getting worse over time. Taking 3000 mg total per tylenol and occasionally half a hydrocodone per day at first-with worsening pain is up to 1 tablet most days-very sparingly has used 2 tablets.Plan is for hopefully knee replacement in November. Using walker- going to her daughters this week to spend some time with her.  Patient complains of severe pain  Wrist continues to bother her. Thinks may have to have an injection with orthopedics.  No obvious recurrence of PMR-.  Down to 2 mg starting tomorrow.  Should be another month if things go well before she comes off the prednisone. A/P: PMR appears to be improving-continue titration off prednisone.  Osteoarthritis worsening- needs knee replacement but has to get off prednisone first.  Due to very high level pain- will increase hydrocodone to 1 tablet most days with very sparingly second tablet to be used.  Hopefully #60 with 1 refill will last her for at least 3 months  #hypertension S: controlled on Amlodipine 10 mg daily. Not checking BP at home regularly- did get a home cuff BP Readings from Last 3 Encounters:  04/07/18 120/76  04/02/18 134/68  03/16/18  110/70  A/P: Hopefully stable-she is going to check with her home cuff and send [place] a message after she gets back from her daughters.  Continue amlodipine  #hyperlipidemia/CAD-follows with cardiology and rarely uses nitroglycerin S:  controlled on Atorvastatin 20 mg once a week plus repatha Lab Results  Component Value Date   CHOL 172 04/07/2018   HDL 73.70 04/07/2018   LDLCALC 70 04/07/2018   LDLDIRECT 101.0 03/16/2018   TRIG 141.0 04/07/2018   CHOLHDL 2 04/07/2018   A/P: Lipids stable. Continue current medications. LDL at goal 70 or less.  CAD appears stable-continue cardiology follow-up and as needed nitroglycerin  #hypothyroidism-on thyroid medication-Taking Levothyroxine 150-300 mcg daily with Dr. Elyse Hsu.    # Depression S: Reasonable control on Pristiq  considering current COVID-19 pandemic as well as chronic pain Depression screen PHQ 2/9 08/31/2018  Decreased Interest 1  Down, Depressed, Hopeless 0  PHQ - 2 Score 1  Altered sleeping 1  Tired, decreased energy 3  Change in appetite 1  Feeling bad or failure about yourself  0  Trouble concentrating 0  Moving slowly or fidgety/restless 0  Suicidal thoughts 0  PHQ-9 Score 6  Difficult doing work/chores Not difficult at all  Some recent data might be hidden  A/P: Reasonable control-counseling provided today-hopeful time with her daughter uplifts her spirits as well.  Continue Pristiq  # Diabetes/obesity morbid-BMI over 70 with hypertension, hyperlipidemia, diabetes S: Well controlled on last check.  Patient is trying to lose weight with weight watchers but continues to struggle- prednisone as well is limited mobility due to knee likely contributing CBGs- Not checking BG at home.  Exercise and diet- Unable to exercise d/t need for B knee replacement. Watching carb and sugar intake. Has been eating more d/t Prednisone.  Lab Results  Component Value Date   HGBA1C 6.0 03/03/2018   HGBA1C 6.6 (H) 11/19/2017   HGBA1C 6.6 (H)  06/04/2017   A/P: Patient would like to put off doing A1c until 3 months follow-up-her A1c has been well controlled and I think this is reasonable-hoping prednisone has not increased the levels but this will give some time for her to come back down once off prednisone  # Anxiety-Lorazepam on hand but states she has never  used.  Knows to separate from hydrocodone by 8 hours  Recommended follow up: 3 months discussed.  At that time need to see when next physical would be as not reviewed today  Lab/Order associations:   ICD-10-CM   1. Well controlled type 2 diabetes mellitus ([place])  E11.9   2. Polymyalgia rheumatica (HCC)  M35.3   3. Osteoarthritis of multiple joints, unspecified osteoarthritis type  M15.9   4. Hyperlipidemia associated with type 2 diabetes mellitus ([place])  E11.69    E78.5   5. Hypertension associated with diabetes ([place])  E11.59    I10   6. Morbid obesity ([place])  E66.01   7. Coronary artery disease of native artery of native heart with stable angina pectoris (HCC) Chronic I25.118   8. BMI 38.0-38.9,adult  Z68.38     Meds ordered this encounter  Medications   HYDROcodone-acetaminophen (NORCO/VICODIN) 5-325 MG tablet    Sig: Take 1 tablet by mouth 2 (two) times daily as needed for moderate pain or severe pain.    Dispense:  60 tablet    Refill:  0    OA of the knees-Chronic pain   HYDROcodone-acetaminophen (NORCO/VICODIN) 5-325 MG tablet    Sig: Take 1 tablet by mouth 2 (two) times daily as needed for moderate pain or severe pain (may refill in 1 month).    Dispense:  60 tablet    Refill:  0    Chronic pain- OA pending surgery once can get off prednisone    Return precautions advised.  Garret Reddish, MD

## 2018-09-07 ENCOUNTER — Telehealth: Payer: Self-pay | Admitting: *Deleted

## 2018-09-07 NOTE — Telephone Encounter (Signed)
Spoke with patient and is schedule with Robbie Lis, PA-C on 9/14 at 11:30 am for pre-op clearance. Pt verbalized understanding and thanked me for the call.

## 2018-09-07 NOTE — Telephone Encounter (Signed)
   Primary Cardiologist: Sinclair Grooms, MD  Chart reviewed as part of pre-operative protocol coverage.   [name] is a 78 y.o. female with a hx of CAD, gastric bypass surgery, diet controlled DM, HTN, HLD, hypothyroidism and recently polymalgia rheumatica. Last seen 03/2018 when admitted for CP and follow up cath showed moderate CAD>> medical management. She hasn't followed up since then.   She will need office with Dr. Tamala Julian (can be seen virtually) or with APP while Dr. Tamala Julian in clinic. Surgery is not scheduled until November.    Pre-op covering staff: - Please schedule appointment and call patient to inform them. - Please contact requesting surgeon's office via preferred method (i.e, phone, fax) to inform them of need for appointment prior to surgery.   [place], [place]  09/07/2018, 1:27 PM

## 2018-09-07 NOTE — Telephone Encounter (Signed)
   [facility] Pre-operative Risk Assessment    Request for surgical clearance:  1. What type of surgery is being performed? LEFT TOTAL KNEE ARTHROPLASTY   2. When is this surgery scheduled? 11/30/18   3. What type of clearance is required (medical clearance vs. Pharmacy clearance to hold med vs. Both)? MEDICAL  4. Are there any medications that need to be held prior to surgery and how long? ASA   5. Practice name and name of physician performing surgery? [facility]; DR. Wynelle Link   6. What is your office phone number [phone]    7.   What is your office fax number [phone]  8.   Anesthesia type (None, local, MAC, general) ? CHOICE   Julaine Hua 09/07/2018, 12:37 PM  _________________________________________________________________   (provider comments below)

## 2018-09-22 ENCOUNTER — Other Ambulatory Visit: Payer: Self-pay

## 2018-09-22 ENCOUNTER — Encounter: Payer: Self-pay | Admitting: Family Medicine

## 2018-09-22 ENCOUNTER — Ambulatory Visit (INDEPENDENT_AMBULATORY_CARE_PROVIDER_SITE_OTHER): Payer: Medicare HMO

## 2018-09-22 DIAGNOSIS — Z23 Encounter for immunization: Secondary | ICD-10-CM

## 2018-09-29 ENCOUNTER — Other Ambulatory Visit: Payer: Self-pay | Admitting: Family Medicine

## 2018-09-29 ENCOUNTER — Encounter: Payer: Self-pay | Admitting: Family Medicine

## 2018-09-30 ENCOUNTER — Other Ambulatory Visit: Payer: Self-pay | Admitting: Family Medicine

## 2018-09-30 MED ORDER — DICLOFENAC SODIUM 75 MG PO TBEC: 75.0000 mg | DELAYED_RELEASE_TABLET | Freq: Two times a day (BID) | ORAL | 0 refills | Status: DC

## 2018-09-30 MED ORDER — HYDROCODONE-ACETAMINOPHEN 5-325 MG PO TABS: 1.0000 | ORAL_TABLET | Freq: Two times a day (BID) | ORAL | 0 refills | Status: DC | PRN

## 2018-09-30 NOTE — Telephone Encounter (Signed)
Last OV 08/31/18 Last refill 08/31/18 #60/0 Next OV not scheduled  Forwarding to Dr. Yong Channel

## 2018-10-01 NOTE — Telephone Encounter (Signed)
Please clarify-I believe that I signed this on September 9

## 2018-10-01 NOTE — Telephone Encounter (Signed)
Rx request 

## 2018-10-04 NOTE — Progress Notes (Addendum)
Cardiology Office Note    Date:  10/05/2018   ID:  [name], DOB 1940-09-15, MRN [id number]  PCP:  Marin Olp, MD  Cardiologist:Dr. Tamala Julian  Chief Complaint: Surgical clearance for left total knee arthoplasty  History of Present Illness:   [name] is a 78 y.o. female with a hx of medically managed CAD, gastric bypass surgery, diet controlled DM, HTN, HLD, hypothyroidism and polymalgia rheumatica seen for surgical clearance.   [name] had an episode of chest pain in June 2019 that radiated to her jaw and left arm. She was evaluated with a cardiac calcium score of 990. She had normal stress test in December 2019 as part of clearance for knee surgery.   Seen for chest pain 03/2018 and surgical clearance. Cath showed modest CAD involving a small first diagonal (75%), distal LCx (70%) and nondominant RCA (70%). Recommended medical therapy.   Presents today for surgical clearance.  She was clear for left knee surgery after cardiac cath in March however it was deferred secondary to polymyalgia rheumatica.  Now off prednisone.  She denies recurrent episode of chest pain.  Limited activity secondary to knee pain.  She is hardly able to walk to mailbox however she goes for grocery shopping multiple times per week without any chest pain or shortness of breath.  She leans on a shopping cart for support.  Denies orthopnea, PND, syncope, lower extremity edema, palpitation, dizziness or syncope.  Past Medical History:  Diagnosis Date   Anemia    Arthritis    osteoarthritis - shoulder, knees & hips   Asthma    Depression    Diabetes mellitus without complication (HCC)    no meds   Hypertension    Dr. Orinda Kenner manages BP, pt. reports MD has not found a need for treatment    Hypothyroidism    Sleep difficulties    had sleep study -2009, prior to gastric surgery, told that there was not a need for f/u   Thyroid disease    Vitamin B 12 deficiency     Past  Surgical History:  Procedure Laterality Date   ABDOMINAL HYSTERECTOMY     BARIATRIC [place]     [facility]- 30 yrs. ago   CATARACT EXTRACTION, BILATERAL     late 2018   LEFT HEART CATH AND CORONARY ANGIOGRAPHY N/A 04/02/2018   Procedure: LEFT HEART CATH AND CORONARY ANGIOGRAPHY;  Surgeon: Martinique, Peter M, MD;  Location: [facility];  Service: Cardiovascular;  Laterality: N/A;   OOPHORECTOMY     pantallor arthrodesis with rod placement left foot     TONSILLECTOMY     TOTAL SHOULDER ARTHROPLASTY Left 03/12/2012   Procedure: TOTAL SHOULDER ARTHROPLASTY;  Surgeon: Marin Shutter, MD;  Location: [facility];  Service: Orthopedics;  Laterality: Left;    Current Medications: Prior to Admission medications   Medication Sig Start Date End Date Taking? Authorizing Provider  acetaminophen (TYLENOL) 500 MG tablet Take 1,000 mg by mouth every 8 (eight) hours as needed for mild pain or headache.    [provider]  albuterol (PROVENTIL HFA;VENTOLIN HFA) 108 (90 Base) MCG/ACT inhaler Inhale 1-2 puffs into the lungs every 4 (four) hours as needed for wheezing or shortness of breath. 02/15/15   Marin Olp, MD  Alcohol Swabs PADS Use to check blood sugar once a day 05/27/17   Marin Olp, MD  amLODipine (NORVASC) 10 MG tablet Take 10 mg by  mouth every evening.    [provider]  aspirin 81 MG tablet Take 81 mg by mouth daily.    [provider]  atorvastatin (LIPITOR) 20 MG tablet TAKE 1 TABLET ONE TIME WEEKLY Patient taking differently: Take 20 mg by mouth every Friday.  02/25/18   Marin Olp, MD  Blood Glucose Calibration (TRUE METRIX LEVEL 2) Normal SOLN Use to calibrate meter as needed 05/27/17   Marin Olp, MD  Blood Glucose Monitoring Suppl (TRUE METRIX AIR GLUCOSE METER) w/Device KIT USE AS DIRECTED 03/31/18   Marin Olp, MD  Cholecalciferol (VITAMIN D) 1000 UNITS capsule Take 5,000 Units by mouth daily.      [provider]  cyanocobalamin (,VITAMIN B-12,) 1000 MCG/ML injection Inject 1 mL (1,000 mcg total) into the muscle every 30 (thirty) days. Uses on amonthly basis 09/03/16   Marin Olp, MD  desvenlafaxine (PRISTIQ) 100 MG 24 hr tablet TAKE 1 TABLET EVERY DAY Patient taking differently: Take 100 mg by mouth daily.  02/25/18   Marin Olp, MD  diclofenac (VOLTAREN) 75 MG EC tablet Take 1 tablet (75 mg total) by mouth 2 (two) times daily. 09/30/18   Marin Olp, MD  Evolocumab (REPATHA SURECLICK) 379 MG/ML SOAJ Inject 1 pen into the skin every 14 (fourteen) days. 06/22/18   Belva Crome, MD  FeFum-FePo-FA-B Cmp-C-Zn-Mn-Cu [facility] PLUS) 024-097.3-5 MG CAPS Take 1 capsule by mouth twice daily 07/13/18   Marin Olp, MD  fluticasone [facility]) 50 MCG/ACT nasal spray USE 1 SPRAY IN EACH NOSTRIL EVERY DAY Patient taking differently: Place 1 spray into both nostrils daily.  09/23/17   Marin Olp, MD  glucose blood test strip Test blood sugar once per day 05/27/17   Marin Olp, MD  HYDROcodone-acetaminophen (NORCO/VICODIN) 5-325 MG tablet Take 1 tablet by mouth 2 (two) times daily as needed for moderate pain or severe pain (may refill in 1 month). 08/31/18   Marin Olp, MD  HYDROcodone-acetaminophen (NORCO/VICODIN) 5-325 MG tablet Take 1 tablet by mouth 2 (two) times daily as needed for moderate pain or severe pain. 09/30/18   Marin Olp, MD  Levothyroxine Sodium (TIROSINT) 150 MCG CAPS Take 150-300 mcg by mouth every morning. Take 2 capsules by mouth for two days then do not take any on the third day; on the fourth and Fifth day take 2 capsules. On the sixth day to not take any. Seventh day take 2 capsules on the eight day take 1 capsule and the ninth day do not take any.  Start over after the ninth day. in morning on empty stomach for thyroid (name brand only DAW) 09/18/17   [provider]  loperamide (IMODIUM) 2 MG capsule Take 4 mg by mouth as needed  for diarrhea or loose stools.    [provider]  LORazepam (ATIVAN) 0.5 MG tablet Take 0.5-1 tablets (0.25-0.5 mg total) by mouth daily as needed for anxiety (do not drive for 8 hours after taking). 07/16/17   Marin Olp, MD  montelukast (SINGULAIR) 10 MG tablet TAKE 1 TABLET EVERY DAY WITH BREAKFAST 04/09/18   Marin Olp, MD  nitroGLYCERIN (NITROSTAT) 0.4 MG SL tablet Place 1 tablet (0.4 mg total) under the tongue every 5 (five) minutes as needed for chest pain. 09/29/17 08/31/18  Belva Crome, MD  Omega-3 Fatty Acids (FISH OIL) 1000 MG CAPS Take 1,000 mg by mouth daily.     [provider]  predniSONE (DELTASONE) 1  MG tablet Take 1-4 tablets (1-4 mg total) by mouth daily with breakfast. As directed per taper 05/25/18   Marin Olp, MD  Propylene Glycol (SYSTANE COMPLETE OP) Apply 1 drop to eye daily as needed (dryness).     [provider]  TRUEplus Lancets 30G MISC Check blood sugar once per day 05/11/18   Marin Olp, MD    Allergies:   Flexeril [cyclobenzaprine], Ace inhibitors, Azithromycin, and Codeine   Social History   Socioeconomic History   Marital status: Widowed    Spouse name: Not on file   Number of children: Not on file   Years of education: Not on file   Highest education level: Not on file  Occupational History   Occupation: retired Optician, dispensing: [employer] strain: Not on file   Food insecurity    Worry: Not on file    Inability: Not on file   Transportation needs    Medical: Not on file    Non-medical: Not on file  Tobacco Use   Smoking status: Former Smoker    Packs/day: 0.70    Years: 20.00    Pack years: 14.00    Quit date: 03/04/1978    Years since quitting: 40.6   Smokeless tobacco: Never Used  Substance and Sexual Activity   Alcohol use: Yes    Alcohol/week: 2.0 standard drinks    Types: 2 Glasses of wine per week    Comment: with dinner  or social rare -     Drug use: No   Sexual activity: Not Currently  Lifestyle   Physical activity    Days per week: Not on file    Minutes per session: Not on file   Stress: Not on file  Relationships   Social connections    Talks on phone: Not on file    Gets together: Not on file    Attends religious service: Not on file    Active member of club or organization: Not on file    Attends meetings of clubs or organizations: Not on file    Relationship status: Not on file  Other Topics Concern   Not on file  Social History Narrative   Widowed 2013. 2 kids. 4 grandkids. Oldest daughter lives in [place] and has 2 grandkids that are now in Scientist, physiological, PT school at [employer].       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   ROS:   Please see the history of present illness.    ROS All other systems reviewed and are negative.   PHYSICAL EXAM:   VS:  BP (!) 142/88    Pulse 61    Ht 5' 1"  (1.549 m)    Wt 211 lb (95.7 kg)    BMI 39.87 kg/m    GEN: Well nourished, well developed, in no acute distress  HEENT: normal  Neck: no JVD, carotid bruits, or masses Cardiac: RRR; no murmurs, rubs, or gallops,no edema  Respiratory:  clear to auscultation bilaterally, normal work of breathing GI: soft, nontender, nondistended, + BS MS: no deformity or atrophy  Skin: warm and dry, no rash Neuro:  Alert and Oriented x 3, Strength and sensation are intact  Psych: euthymic mood, full affect  Wt Readings from Last 3 Encounters:  10/05/18 211 lb (95.7 kg)  08/31/18 205 lb (93 kg)  05/25/18 204 lb (92.5 kg)      Studies/Labs Reviewed:   EKG:  EKG is ordered today.  The ekg ordered today demonstrates normal sinus rhythm at a rate of 61 bpm  Recent Labs: 04/07/2018: ALT 20; BUN 13;  Creatinine, Ser 0.83; Hemoglobin 13.7; Platelets 222.0; Potassium 4.2; Sodium 140   Lipid Panel    Component Value Date/Time   CHOL 172 04/07/2018 1109   TRIG 141.0 04/07/2018 1109   HDL 73.70 04/07/2018 1109   CHOLHDL 2 04/07/2018 1109   VLDL 28.2 04/07/2018 1109   LDLCALC 70 04/07/2018 1109   LDLDIRECT 101.0 03/16/2018 0830    Additional studies/ records that were reviewed today include:   LEFT HEART CATH AND CORONARY ANGIOGRAPHY  Conclusion    Mid RCA lesion is 70% stenosed.  Dist Cx-1 lesion is 65% stenosed.  Dist Cx-2 lesion is 70% stenosed.  1st Diag lesion is 75% stenosed.  The left ventricular systolic function is normal.  LV end diastolic pressure is normal.  The left ventricular ejection fraction is 55-65% by visual estimate.   1. Modest CAD involving a small first diagonal, distal LCx and nondominant RCA 2. Normal LV function 3. Normal LVEDP  Plan: continue medical management. Patient is a candidate for DC later today. She is cleared for knee surgery next week. Recommend follow up with Dr. Daneen Schick as outpatient   Diagnostic Dominance: Left  Intervention     ASSESSMENT & PLAN:    1. CAD Medically manage.  No anginal symptoms.  Continue aspirin, statin and PCSK9 inhibitor.  2.  Hypertension -Blood pressure relatively stable on current medication.  3.  Surgical clearance -Her left knee surgery was deferred after clearance in March secondary to polymyalgia rheumatica. -She is scheduled for surgery in November.  She is barely able to get 4 METS of activity.  However, she goes for grocery shopping without any anginal symptoms.  EKG without acute ischemic abnormality. I think she will be clear at acceptable cardiac risk.  Will review with Dr. Tamala Julian regarding aspirin and clearance,   Addendum: Patient is cleared and okay to hold ASA 5-7 days if needed.   Medication Adjustments/Labs and Tests Ordered: Current medicines are reviewed at length  with the patient today.  Concerns regarding medicines are outlined above.  Medication changes, Labs and Tests ordered today are listed in the Patient Instructions below. Patient Instructions  Medication Instructions:  Your physician recommends that you continue on your current medications as directed. Please refer to the Current Medication list given to you today.  If you need a refill on your cardiac medications before your next appointment, please call your pharmacy.   Lab work: None ordered  If you have labs (blood work) drawn today and your tests are completely normal, you will receive your results only by:  [place] (if you have MyChart) OR  A paper copy in the mail If you have any lab test that is abnormal or we need to change your treatment, we will call you to review the results.  Testing/Procedures: None ordered  Follow-Up: At [facility], you and your health needs are our priority.  As part of our continuing mission to provide you with exceptional heart care, we have created designated Provider Care Teams.  These Care Teams include your primary Cardiologist (physician) and Advanced Practice Providers (APPs -  Physician Assistants and Nurse Practitioners) who all work together to provide you with the care you need, when you need it. You will need a follow up appointment in 6 months.  Please call our office 2 months in advance to schedule this appointment.  You may see Sinclair Grooms, MD or one of the following Advanced Practice Providers on your designated Care Team:   Truitt Merle, NP Cecilie Kicks, NP  Kathyrn Drown, NP  Any Other Special Instructions Will Be Listed Below (If Applicable).       Jarrett Soho, [place]  10/05/2018 12:01 PM    [facility] [place], [place], [place]  [address] Phone: [phone]; Fax: [phone]

## 2018-10-05 ENCOUNTER — Ambulatory Visit (INDEPENDENT_AMBULATORY_CARE_PROVIDER_SITE_OTHER): Payer: Medicare HMO | Admitting: Physician Assistant

## 2018-10-05 ENCOUNTER — Encounter: Payer: Self-pay | Admitting: Physician Assistant

## 2018-10-05 ENCOUNTER — Other Ambulatory Visit: Payer: Self-pay

## 2018-10-05 VITALS — BP 142/88 | HR 61 | Ht 61.0 in | Wt 211.0 lb

## 2018-10-05 DIAGNOSIS — I10 Essential (primary) hypertension: Secondary | ICD-10-CM

## 2018-10-05 DIAGNOSIS — I25118 Atherosclerotic heart disease of native coronary artery with other forms of angina pectoris: Secondary | ICD-10-CM | POA: Diagnosis not present

## 2018-10-05 DIAGNOSIS — Z01810 Encounter for preprocedural cardiovascular examination: Secondary | ICD-10-CM

## 2018-10-05 NOTE — Patient Instructions (Signed)
Medication Instructions:  Your physician recommends that you continue on your current medications as directed. Please refer to the Current Medication list given to you today.  If you need a refill on your cardiac medications before your next appointment, please call your pharmacy.   Lab work: None ordered  If you have labs (blood work) drawn today and your tests are completely normal, you will receive your results only by: . MyChart Message (if you have MyChart) OR . A paper copy in the mail If you have any lab test that is abnormal or we need to change your treatment, we will call you to review the results.  Testing/Procedures: None ordered  Follow-Up: At [facility], you and your health needs are our priority.  As part of our continuing mission to provide you with exceptional heart care, we have created designated Provider Care Teams.  These Care Teams include your primary Cardiologist (physician) and Advanced Practice Providers (APPs -  Physician Assistants and Nurse Practitioners) who all work together to provide you with the care you need, when you need it. You will need a follow up appointment in 6 months.  Please call our office 2 months in advance to schedule this appointment.  You may see Henry W Smith III, MD or one of the following Advanced Practice Providers on your designated Care Team:   Lori Gerhardt, NP Laura Ingold, NP . Jill McDaniel, NP  Any Other Special Instructions Will Be Listed Below (If Applicable).    

## 2018-10-10 NOTE — Progress Notes (Signed)
Okay to hold aspirin if needed.

## 2018-11-09 ENCOUNTER — Other Ambulatory Visit: Payer: Self-pay | Admitting: Family Medicine

## 2018-11-09 ENCOUNTER — Encounter: Payer: Self-pay | Admitting: Family Medicine

## 2018-11-11 ENCOUNTER — Encounter: Payer: Self-pay | Admitting: Family Medicine

## 2018-11-11 ENCOUNTER — Other Ambulatory Visit: Payer: Self-pay

## 2018-11-11 DIAGNOSIS — Z20822 Contact with and (suspected) exposure to covid-19: Secondary | ICD-10-CM

## 2018-11-13 LAB — NOVEL CORONAVIRUS, NAA: SARS-CoV-2, NAA: NOT DETECTED

## 2018-11-18 ENCOUNTER — Encounter: Payer: Self-pay | Admitting: Family Medicine

## 2018-11-18 MED ORDER — CYANOCOBALAMIN 1000 MCG/ML IJ SOLN: 1000.0000 ug | INTRAMUSCULAR | 0 refills | Status: DC

## 2018-11-18 NOTE — Telephone Encounter (Signed)
Dr. Yong Channel, pt requesting refill on B12 injections. She has not had a Vot B12 check since 11/19/2017 and it was high at that time. Please advise.

## 2018-11-23 NOTE — Patient Instructions (Addendum)
DUE TO COVID-19 ONLY ONE VISITOR IS ALLOWED TO COME WITH YOU AND STAY IN THE WAITING ROOM ONLY DURING PRE OP AND PROCEDURE DAY OF SURGERY. THE 1 VISITOR MAY VISIT WITH YOU AFTER SURGERY IN YOUR PRIVATE ROOM DURING VISITING HOURS ONLY!  YOU NEED TO HAVE A COVID 19 TEST ON_11/5______ @__2 :40_____, THIS TEST MUST BE DONE BEFORE SURGERY, COME  [address], [place] , [id number].  ([place]) ONCE YOUR COVID TEST IS COMPLETED, PLEASE BEGIN THE QUARANTINE INSTRUCTIONS AS OUTLINED IN YOUR HANDOUT.                [name]   Your procedure is scheduled on: 11/30/18   Report to [facility] Main  Entrance   Report to Short stay 5:30  AM     Call this number if you have problems the morning of surgery [phone]    [place], NO CHEWING GUM CANDY OR MINTS.    Do not eat food After Midnight  . YOU MAY HAVE CLEAR LIQUIDS FROM MIDNIGHT UNTIL 4:30AM  . At 4:30AM Please finish the prescribed Pre-Surgery Gatorade drink.   Nothing by mouth after you finish the Gatorade drink !   Take these medicines the morning of surgery with A SIP OF WATER: Voltaren, Singular,Levothyroxine, Pristiq, use albuterol and bring it with you                                  You may not have any metal on your body including hair pins and              piercings              Do not wear jewelry, make-up, lotions, powders or perfumes, deodorant             Do not wear nail polish on your fingernails.  Do not shave  48 hours prior to surgery.            Do not bring valuables to the hospital. [name].  Contacts, dentures or bridgework may not be worn into surgery.       Name and phone number of your driver:  Special Instructions: N/A              Please read over the following fact sheets you were  given: _____________________________________________________________________             [facility] - Preparing for Surgery  Before surgery, you can play an important role.   Because skin is not sterile, your skin needs to be as free of germs as possible.   You can reduce the number of germs on your skin by washing with CHG (chlorahexidine gluconate) soap before surgery.   CHG is an antiseptic cleaner which kills germs and bonds with the skin to continue killing germs even after washing. Please DO NOT use if you have an allergy to CHG or antibacterial soaps.   If your skin becomes reddened/irritated stop using the CHG and inform your nurse when you arrive at Short Stay. Do not shave (including legs and underarms) for at least 48 hours prior to the first CHG shower.   . Please follow these instructions carefully:  1.  Shower with CHG  Soap the night before surgery and the  morning of Surgery.  2.  If you choose to wash your hair, wash your hair first as usual with your  normal  shampoo.  3.  After you shampoo, rinse your hair and body thoroughly to remove the  shampoo.                                        4.  Use CHG as you would any other liquid soap.  You can apply chg directly  to the skin and wash                       Gently with a scrungie or clean washcloth.  5.  Apply the CHG Soap to your body ONLY FROM THE NECK DOWN.   Do not use on face/ open                           Wound or open sores. Avoid contact with eyes, ears mouth and genitals (private parts).                       Wash face,  Genitals (private parts) with your normal soap.             6.  Wash thoroughly, paying special attention to the area where your surgery  will be performed.  7.  Thoroughly rinse your body with warm water from the neck down.  8.  DO NOT shower/wash with your normal soap after using and rinsing off  the CHG Soap.             9.  Pat yourself dry with a clean towel.            10.  Wear clean  pajamas.            11.  Place clean sheets on your bed the night of your first shower and do not  sleep with pets. Day of Surgery : Do not apply any lotions/deodorants the morning of surgery.  Please wear clean clothes to the hospital/surgery center.  FAILURE TO FOLLOW THESE INSTRUCTIONS MAY RESULT IN THE CANCELLATION OF YOUR SURGERY PATIENT SIGNATURE_________________________________  NURSE SIGNATURE__________________________________  ________________________________________________________________________   [name]  An incentive spirometer is a tool that can help keep your lungs clear and active. This tool measures how well you are filling your lungs with each breath. Taking long deep breaths may help reverse or decrease the chance of developing breathing (pulmonary) problems (especially infection) following:  A long period of time when you are unable to move or be active. BEFORE THE PROCEDURE   If the spirometer includes an indicator to show your best effort, your nurse or respiratory therapist will set it to a desired goal.  If possible, sit up straight or lean slightly forward. Try not to slouch.  Hold the incentive spirometer in an upright position. INSTRUCTIONS FOR USE  1. Sit on the edge of your bed if possible, or sit up as far as you can in bed or on a chair. 2. Hold the incentive spirometer in an upright position. 3. Breathe out normally. 4. Place the mouthpiece in your mouth and seal your lips tightly around it. 5. Breathe in slowly and as deeply as possible, raising the piston or the ball toward  the top of the column. 6. Hold your breath for 3-5 seconds or for as long as possible. Allow the piston or ball to fall to the bottom of the column. 7. Remove the mouthpiece from your mouth and breathe out normally. 8. Rest for a few seconds and repeat Steps 1 through 7 at least 10 times every 1-2 hours when you are awake. Take your time and take a few normal breaths  between deep breaths. 9. The spirometer may include an indicator to show your best effort. Use the indicator as a goal to work toward during each repetition. 10. After each set of 10 deep breaths, practice coughing to be sure your lungs are clear. If you have an incision (the cut made at the time of surgery), support your incision when coughing by placing a pillow or rolled up towels firmly against it. Once you are able to get out of bed, walk around indoors and cough well. You may stop using the incentive spirometer when instructed by your caregiver.  RISKS AND COMPLICATIONS  Take your time so you do not get dizzy or light-headed.  If you are in pain, you may need to take or ask for pain medication before doing incentive spirometry. It is harder to take a deep breath if you are having pain. AFTER USE  Rest and breathe slowly and easily.  It can be helpful to keep track of a log of your progress. Your caregiver can provide you with a simple table to help with this. If you are using the spirometer at home, follow these instructions: [place] IF:   You are having difficultly using the spirometer.  You have trouble using the spirometer as often as instructed.  Your pain medication is not giving enough relief while using the spirometer.  You develop fever of 100.5 F (38.1 C) or higher. SEEK IMMEDIATE MEDICAL CARE IF:   You cough up bloody sputum that had not been present before.  You develop fever of 102 F (38.9 C) or greater.  You develop worsening pain at or near the incision site. MAKE SURE YOU:   Understand these instructions.  Will watch your condition.  Will get help right away if you are not doing well or get worse. Document Released: 05/20/2006 Document Revised: 04/01/2011 Document Reviewed: 07/21/2006 [facility] Patient Information 2014 [place], [place].   ________________________________________________________________________

## 2018-11-25 ENCOUNTER — Encounter (HOSPITAL_COMMUNITY): Payer: Self-pay

## 2018-11-25 ENCOUNTER — Other Ambulatory Visit: Payer: Self-pay

## 2018-11-25 ENCOUNTER — Encounter (HOSPITAL_COMMUNITY)
Admission: RE | Admit: 2018-11-25 | Discharge: 2018-11-25 | Disposition: A | Discharge status: Home / Self Care | Payer: Medicare HMO | Source: Ambulatory Visit | Attending: Orthopedic Surgery | Admitting: Orthopedic Surgery

## 2018-11-25 ENCOUNTER — Other Ambulatory Visit: Payer: Self-pay | Admitting: Family Medicine

## 2018-11-25 DIAGNOSIS — M1712 Unilateral primary osteoarthritis, left knee: Secondary | ICD-10-CM | POA: Diagnosis not present

## 2018-11-25 DIAGNOSIS — Z01812 Encounter for preprocedural laboratory examination: Secondary | ICD-10-CM | POA: Insufficient documentation

## 2018-11-25 DIAGNOSIS — J45909 Unspecified asthma, uncomplicated: Secondary | ICD-10-CM | POA: Insufficient documentation

## 2018-11-25 DIAGNOSIS — Z7901 Long term (current) use of anticoagulants: Secondary | ICD-10-CM | POA: Insufficient documentation

## 2018-11-25 DIAGNOSIS — Z87891 Personal history of nicotine dependence: Secondary | ICD-10-CM | POA: Diagnosis not present

## 2018-11-25 DIAGNOSIS — Z79899 Other long term (current) drug therapy: Secondary | ICD-10-CM | POA: Diagnosis not present

## 2018-11-25 DIAGNOSIS — E118 Type 2 diabetes mellitus with unspecified complications: Secondary | ICD-10-CM | POA: Diagnosis not present

## 2018-11-25 DIAGNOSIS — Z7982 Long term (current) use of aspirin: Secondary | ICD-10-CM | POA: Diagnosis not present

## 2018-11-25 DIAGNOSIS — E039 Hypothyroidism, unspecified: Secondary | ICD-10-CM | POA: Insufficient documentation

## 2018-11-25 DIAGNOSIS — I2510 Atherosclerotic heart disease of native coronary artery without angina pectoris: Secondary | ICD-10-CM | POA: Diagnosis not present

## 2018-11-25 DIAGNOSIS — I10 Essential (primary) hypertension: Secondary | ICD-10-CM | POA: Insufficient documentation

## 2018-11-25 DIAGNOSIS — Z794 Long term (current) use of insulin: Secondary | ICD-10-CM | POA: Diagnosis not present

## 2018-11-25 HISTORY — DX: Atherosclerotic heart disease of native coronary artery without angina pectoris: I25.10

## 2018-11-25 LAB — COMPREHENSIVE METABOLIC PANEL
ALT: 22 U/L (ref 0–44)
AST: 23 U/L (ref 15–41)
Albumin: 4.3 g/dL (ref 3.5–5.0)
Alkaline Phosphatase: 125 U/L (ref 38–126)
Anion gap: 6 (ref 5–15)
BUN: 23 mg/dL (ref 8–23)
CO2: 26 mmol/L (ref 22–32)
Calcium: 10.1 mg/dL (ref 8.9–10.3)
Chloride: 104 mmol/L (ref 98–111)
Creatinine, Ser: 0.89 mg/dL (ref 0.44–1.00)
GFR calc Af Amer: >60 mL/min (ref >60)
GFR calc non Af Amer: >60 mL/min (ref >60)
Glucose, Bld: 119 mg/dL — ABNORMAL HIGH (ref 70–99)
Potassium: 3.8 mmol/L (ref 3.5–5.1)
Sodium: 136 mmol/L (ref 135–145)
Total Bilirubin: 0.7 mg/dL (ref 0.3–1.2)
Total Protein: 7 g/dL (ref 6.5–8.1)

## 2018-11-25 LAB — CBC
HCT: 44.6 % (ref 36.0–46.0)
Hemoglobin: 14.4 g/dL (ref 12.0–15.0)
MCH: 29.6 pg (ref 26.0–34.0)
MCHC: 32.3 g/dL (ref 30.0–36.0)
MCV: 91.6 fL (ref 80.0–100.0)
Platelets: 210 10*3/uL (ref 150–400)
RBC: 4.87 MIL/uL (ref 3.87–5.11)
RDW: 12.8 % (ref 11.5–15.5)
WBC: 6.4 10*3/uL (ref 4.0–10.5)
nRBC: 0 % (ref 0.0–0.2)

## 2018-11-25 LAB — SURGICAL PCR SCREEN
MRSA, PCR: NEGATIVE
Staphylococcus aureus: POSITIVE — AB

## 2018-11-25 LAB — HEMOGLOBIN A1C
Hgb A1c MFr Bld: 6.1 % — ABNORMAL HIGH (ref 4.8–5.6)
Mean Plasma Glucose: 128.37 mg/dL

## 2018-11-25 LAB — PROTIME-INR
INR: 1.1 (ref 0.8–1.2)
Prothrombin Time: 14 seconds (ref 11.4–15.2)

## 2018-11-25 LAB — APTT: aPTT: 30 seconds (ref 24–36)

## 2018-11-25 LAB — ABO/RH: ABO/RH(D): A POS

## 2018-11-25 NOTE — Progress Notes (Signed)
PCP - Dr. Ansel Bong Cardiologist - Dr. Linard Millers  Chest x-ray - 04/01/18 EKG -10/06/18  Stress Test - 01/02/18 ECHO - no Cardiac Cath - 04/02/18  Sleep Study - yes pt states that findings were negative CPAP - no  Fasting Blood Sugar - 100-120 Checks Blood Sugar _____ times a day once a month. Pt states that she is diet controlled.  Blood Thinner Instructions:ASA Aspirin Instructions:stop 5 days prior. Last Dose:11/24/18  Anesthesia review:   Patient denies shortness of breath, fever, cough and chest pain at PAT appointment yes  Patient verbalized understanding of instructions that were given to them at the PAT appointment. Patient was also instructed that they will need to review over the PAT instructions again at home before surgery. yes

## 2018-11-26 ENCOUNTER — Other Ambulatory Visit (HOSPITAL_COMMUNITY)
Admission: RE | Admit: 2018-11-26 | Discharge: 2018-11-26 | Disposition: A | Discharge status: Home / Self Care | Payer: Medicare HMO | Source: Ambulatory Visit | Attending: Orthopedic Surgery | Admitting: Orthopedic Surgery

## 2018-11-26 DIAGNOSIS — Z01812 Encounter for preprocedural laboratory examination: Secondary | ICD-10-CM | POA: Diagnosis not present

## 2018-11-26 DIAGNOSIS — Z20828 Contact with and (suspected) exposure to other viral communicable diseases: Secondary | ICD-10-CM | POA: Diagnosis not present

## 2018-11-26 NOTE — H&P (Signed)
TOTAL KNEE ADMISSION H&P  Patient is being admitted for left total knee arthroplasty.  Subjective:  Chief Complaint:left knee pain.  HPI: [name], 78 y.o. female, has a history of pain and functional disability in the left knee due to arthritis and has failed non-surgical conservative treatments for greater than 12 weeks to includecorticosteriod injections, viscosupplementation injections and activity modification.  Onset of symptoms was gradual, starting 10 years ago with gradually worsening course since that time. The patient noted no past surgery on the left knee(s).  Patient currently rates pain in the left knee(s) at 10 out of 10 with activity. Patient has worsening of pain with activity and weight bearing and pain that interferes with activities of daily living.  Patient has evidence of bone-on-bone arthritis in the medial and patellofemoral compartments of the left knee with significant medial and patellofemoral narrowing on the right. by imaging studies.There is no active infection.  Patient Active Problem List   Diagnosis Date Noted   Coronary artery disease of native artery of native heart with stable angina pectoris ([place]) 08/31/2018   Chest pain with high risk for cardiac etiology 04/02/2018   Hypercholesteremia    Polymyalgia rheumatica ([place]) 03/16/2018   CAD (coronary artery disease) 11/19/2017   Anxiety 07/17/2017   Osteopenia of left hip 07/17/2017   Overactive bladder 09/03/2016   Morbid obesity ([place]) 06/28/2015   Hypercalcemia 12/29/2014   Allergic rhinitis 07/20/2014   History of gastric bypass 02/08/2014   Osteoarthritis 10/05/2013   Hyperlipidemia associated with type 2 diabetes mellitus ([place]) 01/05/2008   CHEST WALL PAIN, ANTERIOR 10/09/2007   Hypothyroidism 09/09/2007   Well controlled type 2 diabetes mellitus ([place]) 07/18/2006   Major depression, recurrent, full remission ([place]) 07/18/2006   Hypertension associated with diabetes ([place])  07/18/2006   ASTHMA 07/18/2006   Past Medical History:  Diagnosis Date   Anemia    Arthritis    osteoarthritis - shoulder, knees & hips   Asthma    Coronary artery disease    Depression    Diabetes mellitus without complication (HCC)    no meds   Hypertension    Dr. Orinda Kenner manages BP, pt. reports MD has not found a need for treatment    Hypothyroidism    Sleep difficulties    had sleep study -2009, prior to gastric surgery, told that there was not a need for f/u   Thyroid disease    Vitamin B 12 deficiency     Past Surgical History:  Procedure Laterality Date   ABDOMINAL HYSTERECTOMY     BARIATRIC [place]     [facility]- 30 yrs. ago   CATARACT EXTRACTION, BILATERAL     late 2018   LEFT HEART CATH AND CORONARY ANGIOGRAPHY N/A 04/02/2018   Procedure: LEFT HEART CATH AND CORONARY ANGIOGRAPHY;  Surgeon: Martinique, Peter M, MD;  Location: [facility];  Service: Cardiovascular;  Laterality: N/A;   OOPHORECTOMY     pantallor arthrodesis with rod placement left foot     TONSILLECTOMY     TOTAL SHOULDER ARTHROPLASTY Left 03/12/2012   Procedure: TOTAL SHOULDER ARTHROPLASTY;  Surgeon: Marin Shutter, MD;  Location: [place];  Service: Orthopedics;  Laterality: Left;    No current facility-administered medications for this encounter.    Current Outpatient Medications  Medication Sig Dispense Refill Last Dose   acetaminophen (TYLENOL) 500 MG tablet Take 1,000 mg by mouth every 8 (eight) hours as needed for mild pain or headache.  albuterol (PROVENTIL HFA;VENTOLIN HFA) 108 (90 Base) MCG/ACT inhaler Inhale 1-2 puffs into the lungs every 4 (four) hours as needed for wheezing or shortness of breath. 1 Inhaler 6    amLODipine (NORVASC) 10 MG tablet Take 10 mg by mouth at bedtime.       aspirin 81 MG tablet Take 81 mg by mouth daily.      atorvastatin (LIPITOR) 20 MG tablet Take 20 mg by mouth every Friday.       Cholecalciferol  (VITAMIN D-3) 125 MCG (5000 UT) TABS Take 5,000 Units by mouth daily.      Cyanocobalamin (VITAMIN B-12) 5000 MCG SUBL Take 5,000 mcg by mouth daily.      desvenlafaxine (PRISTIQ) 100 MG 24 hr tablet TAKE 1 TABLET EVERY DAY (Patient taking differently: Take 100 mg by mouth daily. ) 90 tablet 3    diclofenac (VOLTAREN) 75 MG EC tablet Take 1 tablet (75 mg total) by mouth 2 (two) times daily. 180 tablet 0    Evolocumab (REPATHA SURECLICK) 818 MG/ML SOAJ Inject 1 pen into the skin every 14 (fourteen) days. (Patient taking differently: Inject 1 pen into the skin every 14 (fourteen) days. Mondays) 6 pen 3    FeFum-FePo-FA-B Cmp-C-Zn-Mn-Cu (SE-TAN PLUS) 162-115.2-1 MG CAPS Take 1 capsule by mouth twice daily (Patient taking differently: Take 1 capsule by mouth 2 (two) times daily. ) 180 capsule 0    fluticasone (FLONASE) 50 MCG/ACT nasal spray USE 1 SPRAY IN EACH NOSTRIL EVERY DAY (Patient taking differently: Place 1 spray into both nostrils daily. ) 32 g 3    HYDROcodone-acetaminophen (NORCO/VICODIN) 5-325 MG tablet Take 1 tablet by mouth 2 (two) times daily as needed for moderate pain or severe pain. 60 tablet 0    Levothyroxine Sodium (TIROSINT) 150 MCG CAPS Take 300 mcg by mouth daily before breakfast.       loperamide (IMODIUM) 2 MG capsule Take 4 mg by mouth as needed for diarrhea or loose stools.      montelukast (SINGULAIR) 10 MG tablet TAKE 1 TABLET EVERY DAY WITH BREAKFAST (Patient taking differently: Take 10 mg by mouth daily with breakfast. ) 90 tablet 3    nitroGLYCERIN (NITROSTAT) 0.4 MG SL tablet Place 1 tablet (0.4 mg total) under the tongue every 5 (five) minutes as needed for chest pain. 25 tablet 6    Omega-3 Fatty Acids (FISH OIL) 1000 MG CAPS Take 1,000 mg by mouth daily.      Propylene Glycol (SYSTANE COMPLETE OP) Apply 1 drop to eye 2 (two) times daily as needed (dryness).       Alcohol Swabs PADS Use to check blood sugar once a day 100 each 11    Blood Glucose  Calibration (TRUE METRIX LEVEL 2) Normal SOLN Use to calibrate meter as needed 1 each 0    Blood Glucose Monitoring Suppl (TRUE METRIX AIR GLUCOSE METER) w/Device KIT USE AS DIRECTED 1 kit 0    cyanocobalamin (,VITAMIN B-12,) 1000 MCG/ML injection Inject 1 mL (1,000 mcg total) into the muscle every 30 (thirty) days. Uses on amonthly basis 10 mL 0    glucose blood test strip Test blood sugar once per day 100 each 3    TRUEplus Lancets 30G MISC Check blood sugar once per day 100 each 3    Allergies  Allergen Reactions   Flexeril [Cyclobenzaprine]     On Pristiq  With possible serotonin reaction to flexeril   Ace Inhibitors     States after a surgery she was  told this. Not sure what the reason was.    Azithromycin     REACTION: Rash   Codeine     REACTION: Upset stomach    Social History   Tobacco Use   Smoking status: Former Smoker    Packs/day: 0.70    Years: 20.00    Pack years: 14.00    Quit date: 03/04/1978    Years since quitting: 40.7   Smokeless tobacco: Never Used  Substance Use Topics   Alcohol use: Yes    Alcohol/week: 2.0 standard drinks    Types: 2 Glasses of wine per week    Comment: with dinner  or social rare -     Family History  Problem Relation Age of Onset   Stroke Mother    Liver cancer Father    Cancer Father        liver   Breast cancer Neg Hx      Review of Systems  Constitutional: Negative for chills and fever.  Respiratory: Negative for cough and shortness of breath.   Cardiovascular: Negative for chest pain and palpitations.  Gastrointestinal: Negative for nausea and vomiting.  Musculoskeletal: Positive for joint pain.    Objective:  Physical Exam Obese. General: Alert and oriented x3, cooperative and pleasant, no acute distress. Head: normocephalic, atraumatic, neck supple. Eyes: EOMI. Respiratory: breath sounds clear in all fields, no wheezing, rales, or rhonchi. Cardiovascular: Regular rate and rhythm, no murmurs,  gallops or rubs. Abdomen: non-tender to palpation and soft, normoactive bowel sounds.  Musculoskeletal: Right Knee Exam: No effusion. Range of motion is 0-125 degrees. Moderate crepitus on range of motion of the knee. Positive medial joint line tenderness. Positive lateral joint line tenderness. Stable knee.  Left Knee Exam: No effusion. Range of motion is 5-125 degrees. Marked crepitus on range of motion of the knee. Positive medial joint line tenderness. Positive lateral joint line tenderness. Stable knee.  Calves soft and nontender. Motor function intact in LE. Strength 5/5 LE bilaterally. Neuro: Distal pulses 2+. Sensation to light touch intact in LE.  Vital signs in last 24 hours:   Labs:   Estimated body mass index is 37.85 kg/m as calculated from the following:   Height as of 11/25/18: 5' 1"  (1.549 m).   Weight as of 11/25/18: 90.9 kg.   Imaging Review Plain radiographs demonstrate severe degenerative joint disease of the left knee(s). The overall alignment isneutral. The bone quality appears to be adequate for age and reported activity level.   Assessment/Plan:  End stage arthritis, left knee   The patient history, physical examination, clinical judgment of the provider and imaging studies are consistent with end stage degenerative joint disease of the left knee(s) and total knee arthroplasty is deemed medically necessary. The treatment options including medical management, injection therapy arthroscopy and arthroplasty were discussed at length. The risks and benefits of total knee arthroplasty were presented and reviewed. The risks due to aseptic loosening, infection, stiffness, patella tracking problems, thromboembolic complications and other imponderables were discussed. The patient acknowledged the explanation, agreed to proceed with the plan and consent was signed. Patient is being admitted for inpatient treatment for surgery, pain control, PT, OT, prophylactic  antibiotics, VTE prophylaxis, progressive ambulation and ADL's and discharge planning. The patient is planning to be discharged home.  Therapy Plans: outpatient therapy at [facility] Disposition: Home with daughter and granddaughter Planned DVT Prophylaxis: aspirin 368m BID DME needed: walker, 3-n-1 PCP: Dr. HYong Channel clearance received Cardiologist: Dr. HDaneen SchickIII , clearance received  TXA: IV Allergies: Azithromycin - rash, codeine- nausea, cyclobenzaprine - interacted with Pristiq Anesthesia Concerns: none BMI: 39  Last HgbA1c: 6.1%  Other: Had an issue with Flexeril and Pristiq, not sure if she will tolerate Robaxin. Hx of CAD.  Anticipated LOS equal to or greater than 2 midnights due to - Age 22 and older with one or more of the following:  - Obesity  - Expected need for hospital services (PT, OT, Nursing) required for safe  discharge  - Anticipated need for postoperative skilled nursing care or inpatient rehab  - Active co-morbidities: Diabetes OR   - Unanticipated findings during/Post Surgery: None  - Patient is a high risk of re-admission due to: None   - Patient was instructed on what medications to stop prior to surgery. - Follow-up visit in 2 weeks with Dr. Wynelle Link - Begin physical therapy following surgery - Pre-operative lab work as pre-surgical testing - Prescriptions will be provided in hospital at time of discharge  Griffith Citron, PA-C Orthopedic Surgery [facility] [place] [phone]

## 2018-11-27 LAB — NOVEL CORONAVIRUS, NAA (HOSP ORDER, SEND-OUT TO REF LAB; TAT 18-24 HRS): SARS-CoV-2, NAA: NOT DETECTED

## 2018-11-27 NOTE — Progress Notes (Signed)
Anesthesia Chart Review   Case: [id number] Date/Time: 11/30/18 0700   Procedure: TOTAL KNEE ARTHROPLASTY (Left )   Anesthesia type: Choice   Pre-op diagnosis: left knee osteoarthritis   Location: [facility] / [facility]   Surgeon: Gaynelle Arabian, MD      DISCUSSION:78 y.o. former smoker (14 pack years, quit 03/04/78) with h/o asthma, hypothyroidism, HTN, s/p gastric bypass, DM diet controlled, CAD, left knee OA scheduled for above procedure 11/30/2018 with Dr. Gaynelle Arabian.    Low risk stress test 01/02/2018.  Cardiac Cath 04/02/2018.  Per results, "continue medical management. Patient is a candidate for DC later today. She is cleared for knee surgery next week. Recommend follow up with Dr. Daneen Schick as outpatient."   Last seen by cardiology 10/05/2018.  Seen by Leanor Kail, PA-C.  Per OV note, "Her left knee surgery was deferred after clearance in March secondary to polymyalgia rheumatica. -She is scheduled for surgery in November.  She is barely able to get 4 METS of activity.  However, she goes for grocery shopping without any anginal symptoms.  EKG without acute ischemic abnormality. I think she will be clear at acceptable cardiac risk.  Will review with Dr. Tamala Julian regarding aspirin and clearance."  Per Dr. Tamala Julian ok to proceed and hold aspirin.  Polymyalgia rheumatica now resolved.  She is no longer taking prednisone.    VS: BP (!) 144/79   Pulse 71   Temp 36.8 C (Oral)   Resp 16   Ht _0  (1.549 m)   Wt 90.9 kg   SpO2 96%   BMI 37.85 kg/m   PROVIDERS: Marin Olp, MD is PCP   Daneen Schick, MD is Cardiologist  LABS: Labs reviewed: Acceptable for surgery. (all labs ordered are listed, but only abnormal results are displayed)  Labs Reviewed  SURGICAL PCR SCREEN - Abnormal; Notable for the following components:      Result Value   Staphylococcus aureus POSITIVE (*)    All other components within normal limits  COMPREHENSIVE METABOLIC PANEL - Abnormal; Notable for the  following components:   Glucose, Bld 119 (*)    All other components within normal limits  HEMOGLOBIN A1C - Abnormal; Notable for the following components:   Hgb A1c MFr Bld 6.1 (*)    All other components within normal limits  APTT  CBC  PROTIME-INR  TYPE AND SCREEN  ABO/RH     IMAGES: Chest Xray 04/01/2018 FINDINGS: Chronic tortuosity of the thoracic aorta appears stable. Other mediastinal contours are within normal limits. Lung volumes are within normal limits. Both lungs appear clear. No pneumothorax or pleural effusion. Visualized tracheal air column is within normal limits. Stable abdominal surgical clips. Negative visible bowel gas pattern. Partially visible spinal and left shoulder hardware. No acute osseous abnormality identified.  IMPRESSION: No acute cardiopulmonary abnormality.  EKG: 10/06/2018 Rate 61 bpm Normal sinus rhythm  Cannot rule out Anterior infarct, age undetermined   CV: Cardiac Cath 04/02/2018  Mid RCA lesion is 70% stenosed.  Dist Cx-1 lesion is 65% stenosed.  Dist Cx-2 lesion is 70% stenosed.  1st Diag lesion is 75% stenosed.  The left ventricular systolic function is normal.  LV end diastolic pressure is normal.  The left ventricular ejection fraction is 55-65% by visual estimate.   1. Modest CAD involving a small first diagonal, distal LCx and nondominant RCA 2. Normal LV function 3. Normal LVEDP  Plan: continue medical management. Patient is a candidate for DC later today. She is  cleared for knee surgery next week. Recommend follow up with Dr. Daneen Schick as outpatient.   Myocardial Perfusion Imaging 01/02/2018  Nuclear stress EF: 65%. The left ventricular ejection fraction is normal (55-65%).  The patient had an ejection of Lexiscan. There were no ST or T wave changes following the injection.  This is a low risk study. There is no evidence of ischemia and no evidence of previous infarction. Left ventricular systolic function  is normal  The study is normal. Past Medical History:  Diagnosis Date  . Anemia   . Arthritis    osteoarthritis - shoulder, knees & hips  . Asthma   . Coronary artery disease   . Depression   . Diabetes mellitus without complication (HCC)    no meds  . Hypertension    Dr. Orinda Kenner manages BP, pt. reports MD has not found a need for treatment   . Hypothyroidism   . Sleep difficulties    had sleep study -2009, prior to gastric surgery, told that there was not a need for f/u  . Thyroid disease   . Vitamin B 12 deficiency     Past Surgical History:  Procedure Laterality Date  . ABDOMINAL HYSTERECTOMY    . BARIATRIC SURGERY    . CARDIAC CATHETERIZATION     [facility]- [facility] yrs. ago  . CATARACT EXTRACTION, BILATERAL     late 2018  . LEFT HEART CATH AND CORONARY ANGIOGRAPHY N/A 04/02/2018   Procedure: LEFT HEART CATH AND CORONARY ANGIOGRAPHY;  Surgeon: Martinique, Peter M, MD;  Location: [facility];  Service: Cardiovascular;  Laterality: N/A;  . OOPHORECTOMY    . pantallor arthrodesis with rod placement left foot    . TONSILLECTOMY    . TOTAL SHOULDER ARTHROPLASTY Left 03/12/2012   Procedure: TOTAL SHOULDER ARTHROPLASTY;  Surgeon: Marin Shutter, MD;  Location: [facility];  Service: Orthopedics;  Laterality: Left;    MEDICATIONS: . acetaminophen (TYLENOL) 500 MG tablet  . albuterol (PROVENTIL HFA;VENTOLIN HFA) 108 (90 Base) MCG/ACT inhaler  . Alcohol Swabs PADS  . amLODipine (NORVASC) 10 MG tablet  . aspirin 81 MG tablet  . atorvastatin (LIPITOR) 20 MG tablet  . Blood Glucose Calibration (TRUE METRIX LEVEL 2) Normal SOLN  . Blood Glucose Monitoring Suppl (TRUE METRIX AIR GLUCOSE METER) w/Device KIT  . Cholecalciferol (VITAMIN D-3) 125 MCG (5000 UT) TABS  . cyanocobalamin (,VITAMIN B-12,) 1000 MCG/ML injection  . Cyanocobalamin (VITAMIN B-12) 5000 MCG SUBL  . desvenlafaxine (PRISTIQ) 100 MG 24 hr tablet  . diclofenac (VOLTAREN) 75 MG EC tablet  . Evolocumab (REPATHA SURECLICK) 903  MG/ML SOAJ  . FeFum-FePo-FA-B Cmp-C-Zn-Mn-Cu (SE-TAN PLUS) 162-115.2-1 MG CAPS  . fluticasone (FLONASE) 50 MCG/ACT nasal spray  . glucose blood test strip  . HYDROcodone-acetaminophen (NORCO/VICODIN) 5-325 MG tablet  . Levothyroxine Sodium (TIROSINT) 150 MCG CAPS  . loperamide (IMODIUM) 2 MG capsule  . montelukast (SINGULAIR) 10 MG tablet  . nitroGLYCERIN (NITROSTAT) 0.4 MG SL tablet  . Omega-3 Fatty Acids (FISH OIL) 1000 MG CAPS  . Propylene Glycol (SYSTANE COMPLETE OP)  . TRUEplus Lancets 30G MISC   No current facility-administered medications for this encounter.      Maia Plan [facility] Pre-Surgical Testing [phone] 11/27/18  10:53 AM

## 2018-11-27 NOTE — Anesthesia Preprocedure Evaluation (Addendum)
Anesthesia Evaluation  Patient identified by MRN, date of birth, ID band Patient awake    Reviewed: Allergy & Precautions, NPO status , Patient's Chart, lab work & pertinent test results  Airway Mallampati: I  TM Distance: >3 FB Neck ROM: Full    Dental  (+) Dental Advisory Given, Upper Dentures, Lower Dentures   Pulmonary asthma , former smoker,    breath sounds clear to auscultation       Cardiovascular hypertension, Pt. on medications + CAD   Rhythm:Regular Rate:Normal     Neuro/Psych Anxiety Depression    GI/Hepatic negative GI ROS, Neg liver ROS,   Endo/Other  diabetesHypothyroidism   Renal/GU negative Renal ROS     Musculoskeletal  (+) Arthritis ,   Abdominal (+) + obese,   Peds  Hematology   Anesthesia Other Findings   Reproductive/Obstetrics                           Lab Results  Component Value Date   WBC 6.4 11/25/2018   HGB 14.4 11/25/2018   HCT 44.6 11/25/2018   MCV 91.6 11/25/2018   PLT 210 11/25/2018   Lab Results  Component Value Date   CREATININE 0.89 11/25/2018   BUN 23 11/25/2018   NA 136 11/25/2018   K 3.8 11/25/2018   CL 104 11/25/2018   CO2 26 11/25/2018   Lab Results  Component Value Date   INR 1.1 11/25/2018     Anesthesia Physical Anesthesia Plan  ASA: III  Anesthesia Plan: Spinal   Post-op Pain Management:  Regional for Post-op pain   Induction: Intravenous  PONV Risk Score and Plan: 3 and Ondansetron, Dexamethasone and Propofol infusion  Airway Management Planned: Natural Airway and Simple Face Mask  Additional Equipment: None  Intra-op Plan:   Post-operative Plan:   Informed Consent: I have reviewed the patients History and Physical, chart, labs and discussed the procedure including the risks, benefits and alternatives for the proposed anesthesia with the patient or authorized representative who has indicated his/her understanding  and acceptance.     Dental advisory given  Plan Discussed with: CRNA  Anesthesia Plan Comments: (See PAT note 11/25/2018, Konrad Felix, PA-C)      Anesthesia Quick Evaluation

## 2018-11-29 MED ORDER — BUPIVACAINE LIPOSOME 1.3 % IJ SUSP
20.0000 mL | Freq: Once | INTRAMUSCULAR | Status: DC
  Filled 2018-11-29: qty 20

## 2018-11-30 ENCOUNTER — Ambulatory Visit (HOSPITAL_COMMUNITY): Payer: Medicare HMO | Admitting: Physician Assistant

## 2018-11-30 ENCOUNTER — Encounter (HOSPITAL_COMMUNITY): Payer: Self-pay | Admitting: *Deleted

## 2018-11-30 ENCOUNTER — Telehealth (HOSPITAL_COMMUNITY): Payer: Self-pay | Admitting: *Deleted

## 2018-11-30 ENCOUNTER — Other Ambulatory Visit: Payer: Self-pay

## 2018-11-30 ENCOUNTER — Ambulatory Visit (HOSPITAL_COMMUNITY): Payer: Medicare HMO | Admitting: Registered Nurse

## 2018-11-30 ENCOUNTER — Encounter (HOSPITAL_COMMUNITY)
Admission: RE | Disposition: A | Discharge status: Home / Self Care | Payer: Self-pay | Source: Ambulatory Visit | Attending: Orthopedic Surgery

## 2018-11-30 ENCOUNTER — Ambulatory Visit (HOSPITAL_COMMUNITY)
Admission: RE | Admit: 2018-11-30 | Discharge: 2018-12-01 | Disposition: A | Discharge status: Home / Self Care | Payer: Medicare HMO | Source: Ambulatory Visit | Attending: Orthopedic Surgery | Admitting: Orthopedic Surgery

## 2018-11-30 DIAGNOSIS — Z9841 Cataract extraction status, right eye: Secondary | ICD-10-CM | POA: Diagnosis not present

## 2018-11-30 DIAGNOSIS — E785 Hyperlipidemia, unspecified: Secondary | ICD-10-CM | POA: Diagnosis not present

## 2018-11-30 DIAGNOSIS — Z885 Allergy status to narcotic agent status: Secondary | ICD-10-CM | POA: Insufficient documentation

## 2018-11-30 DIAGNOSIS — Z79899 Other long term (current) drug therapy: Secondary | ICD-10-CM | POA: Insufficient documentation

## 2018-11-30 DIAGNOSIS — Z7901 Long term (current) use of anticoagulants: Secondary | ICD-10-CM | POA: Diagnosis not present

## 2018-11-30 DIAGNOSIS — I10 Essential (primary) hypertension: Secondary | ICD-10-CM | POA: Insufficient documentation

## 2018-11-30 DIAGNOSIS — J45909 Unspecified asthma, uncomplicated: Secondary | ICD-10-CM | POA: Diagnosis not present

## 2018-11-30 DIAGNOSIS — E119 Type 2 diabetes mellitus without complications: Secondary | ICD-10-CM | POA: Insufficient documentation

## 2018-11-30 DIAGNOSIS — G8918 Other acute postprocedural pain: Secondary | ICD-10-CM | POA: Diagnosis not present

## 2018-11-30 DIAGNOSIS — M179 Osteoarthritis of knee, unspecified: Secondary | ICD-10-CM

## 2018-11-30 DIAGNOSIS — Z9842 Cataract extraction status, left eye: Secondary | ICD-10-CM | POA: Diagnosis not present

## 2018-11-30 DIAGNOSIS — M1710 Unilateral primary osteoarthritis, unspecified knee: Secondary | ICD-10-CM

## 2018-11-30 DIAGNOSIS — Z96612 Presence of left artificial shoulder joint: Secondary | ICD-10-CM | POA: Insufficient documentation

## 2018-11-30 DIAGNOSIS — E039 Hypothyroidism, unspecified: Secondary | ICD-10-CM | POA: Diagnosis not present

## 2018-11-30 DIAGNOSIS — Z7982 Long term (current) use of aspirin: Secondary | ICD-10-CM | POA: Diagnosis not present

## 2018-11-30 DIAGNOSIS — E7800 Pure hypercholesterolemia, unspecified: Secondary | ICD-10-CM | POA: Insufficient documentation

## 2018-11-30 DIAGNOSIS — M353 Polymyalgia rheumatica: Secondary | ICD-10-CM | POA: Insufficient documentation

## 2018-11-30 DIAGNOSIS — D649 Anemia, unspecified: Secondary | ICD-10-CM | POA: Insufficient documentation

## 2018-11-30 DIAGNOSIS — E538 Deficiency of other specified B group vitamins: Secondary | ICD-10-CM | POA: Diagnosis not present

## 2018-11-30 DIAGNOSIS — F329 Major depressive disorder, single episode, unspecified: Secondary | ICD-10-CM | POA: Diagnosis not present

## 2018-11-30 DIAGNOSIS — I2510 Atherosclerotic heart disease of native coronary artery without angina pectoris: Secondary | ICD-10-CM | POA: Insufficient documentation

## 2018-11-30 DIAGNOSIS — Z880 Allergy status to penicillin: Secondary | ICD-10-CM | POA: Diagnosis not present

## 2018-11-30 DIAGNOSIS — M1712 Unilateral primary osteoarthritis, left knee: Secondary | ICD-10-CM | POA: Diagnosis not present

## 2018-11-30 DIAGNOSIS — Z6837 Body mass index (BMI) 37.0-37.9, adult: Secondary | ICD-10-CM | POA: Diagnosis not present

## 2018-11-30 DIAGNOSIS — Z9884 Bariatric surgery status: Secondary | ICD-10-CM | POA: Insufficient documentation

## 2018-11-30 DIAGNOSIS — Z888 Allergy status to other drugs, medicaments and biological substances status: Secondary | ICD-10-CM | POA: Diagnosis not present

## 2018-11-30 HISTORY — PX: TOTAL KNEE ARTHROPLASTY: SHX125

## 2018-11-30 LAB — TYPE AND SCREEN
ABO/RH(D): A POS
Antibody Screen: NEGATIVE

## 2018-11-30 LAB — GLUCOSE, CAPILLARY
Glucose-Capillary: 110 mg/dL — ABNORMAL HIGH (ref 70–99)
Glucose-Capillary: 139 mg/dL — ABNORMAL HIGH (ref 70–99)
Glucose-Capillary: 155 mg/dL — ABNORMAL HIGH (ref 70–99)
Glucose-Capillary: 203 mg/dL — ABNORMAL HIGH (ref 70–99)
Glucose-Capillary: 155 mg/dL — ABNORMAL HIGH (ref 70–99)

## 2018-11-30 SURGERY — ARTHROPLASTY, KNEE, TOTAL
Anesthesia: Spinal | Site: Knee | Laterality: Left

## 2018-11-30 MED ORDER — STERILE WATER FOR IRRIGATION IR SOLN
Status: DC | PRN
  Administered 2018-11-30: 2000 mL

## 2018-11-30 MED ORDER — LACTATED RINGERS IV SOLN: INTRAVENOUS | Status: DC

## 2018-11-30 MED ORDER — METHOCARBAMOL 500 MG IVPB - SIMPLE MED
500.0000 mg | Freq: Four times a day (QID) | INTRAVENOUS | Status: DC | PRN
  Filled 2018-11-30: qty 50

## 2018-11-30 MED ORDER — LACTATED RINGERS IV SOLN
INTRAVENOUS | Status: DC
  Administered 2018-11-30: 07:00:00 via INTRAVENOUS

## 2018-11-30 MED ORDER — VENLAFAXINE HCL ER 150 MG PO CP24
150.0000 mg | ORAL_CAPSULE | Freq: Every day | ORAL | Status: DC
  Administered 2018-12-01: 150 mg via ORAL
  Filled 2018-11-30: qty 1

## 2018-11-30 MED ORDER — FLUTICASONE PROPIONATE 50 MCG/ACT NA SUSP
1.0000 | Freq: Every day | NASAL | Status: DC
  Administered 2018-11-30 – 2018-12-01 (×2): 1 via NASAL
  Filled 2018-11-30: qty 16

## 2018-11-30 MED ORDER — CEFAZOLIN SODIUM-DEXTROSE 2-4 GM/100ML-% IV SOLN
INTRAVENOUS | Status: AC
  Filled 2018-11-30: qty 100

## 2018-11-30 MED ORDER — ACETAMINOPHEN 325 MG PO TABS: 325.0000 mg | ORAL_TABLET | Freq: Once | ORAL | Status: DC | PRN

## 2018-11-30 MED ORDER — FLEET ENEMA 7-19 GM/118ML RE ENEM: 1.0000 | ENEMA | Freq: Once | RECTAL | Status: DC | PRN

## 2018-11-30 MED ORDER — SODIUM CHLORIDE (PF) 0.9 % IJ SOLN
INTRAMUSCULAR | Status: DC | PRN
  Administered 2018-11-30: 60 mL

## 2018-11-30 MED ORDER — PHENYLEPHRINE 40 MCG/ML (10ML) SYRINGE FOR IV PUSH (FOR BLOOD PRESSURE SUPPORT)
PREFILLED_SYRINGE | INTRAVENOUS | Status: AC
  Filled 2018-11-30: qty 10

## 2018-11-30 MED ORDER — FENTANYL CITRATE (PF) 100 MCG/2ML IJ SOLN
INTRAMUSCULAR | Status: AC
  Filled 2018-11-30: qty 2

## 2018-11-30 MED ORDER — TRANEXAMIC ACID-NACL 1000-0.7 MG/100ML-% IV SOLN
1000.0000 mg | INTRAVENOUS | Status: AC
  Administered 2018-11-30: 1000 mg via INTRAVENOUS

## 2018-11-30 MED ORDER — OXYCODONE HCL 5 MG PO TABS
10.0000 mg | ORAL_TABLET | ORAL | Status: DC | PRN
  Administered 2018-11-30 – 2018-12-01 (×4): 10 mg via ORAL
  Filled 2018-11-30 (×4): qty 2

## 2018-11-30 MED ORDER — PROPOFOL 500 MG/50ML IV EMUL
INTRAVENOUS | Status: AC
  Filled 2018-11-30: qty 50

## 2018-11-30 MED ORDER — POVIDONE-IODINE 10 % EX SWAB: 2.0000 "application " | Freq: Once | CUTANEOUS | Status: DC

## 2018-11-30 MED ORDER — SODIUM CHLORIDE 0.9 % IV SOLN
INTRAVENOUS | Status: DC
  Administered 2018-11-30 (×3): via INTRAVENOUS

## 2018-11-30 MED ORDER — MEPERIDINE HCL 50 MG/ML IJ SOLN: 6.2500 mg | INTRAMUSCULAR | Status: DC | PRN

## 2018-11-30 MED ORDER — DEXAMETHASONE SODIUM PHOSPHATE 10 MG/ML IJ SOLN
8.0000 mg | Freq: Once | INTRAMUSCULAR | Status: AC
  Administered 2018-11-30: 8 mg via INTRAVENOUS

## 2018-11-30 MED ORDER — MORPHINE SULFATE (PF) 2 MG/ML IV SOLN
1.0000 mg | INTRAVENOUS | Status: DC | PRN
  Administered 2018-11-30 – 2018-12-01 (×3): 1 mg via INTRAVENOUS
  Filled 2018-11-30 (×3): qty 1

## 2018-11-30 MED ORDER — POLYETHYLENE GLYCOL 3350 17 G PO PACK: 17.0000 g | PACK | Freq: Every day | ORAL | Status: DC | PRN

## 2018-11-30 MED ORDER — FENTANYL CITRATE (PF) 100 MCG/2ML IJ SOLN
INTRAMUSCULAR | Status: DC | PRN
  Administered 2018-11-30 (×2): 50 ug via INTRAVENOUS

## 2018-11-30 MED ORDER — SODIUM CHLORIDE (PF) 0.9 % IJ SOLN
INTRAMUSCULAR | Status: AC
  Filled 2018-11-30: qty 50

## 2018-11-30 MED ORDER — TRANEXAMIC ACID-NACL 1000-0.7 MG/100ML-% IV SOLN
INTRAVENOUS | Status: AC
  Filled 2018-11-30: qty 100

## 2018-11-30 MED ORDER — METHOCARBAMOL 500 MG PO TABS
500.0000 mg | ORAL_TABLET | Freq: Four times a day (QID) | ORAL | Status: DC | PRN
  Administered 2018-11-30 – 2018-12-01 (×4): 500 mg via ORAL
  Filled 2018-11-30 (×4): qty 1

## 2018-11-30 MED ORDER — ONDANSETRON HCL 4 MG/2ML IJ SOLN: 4.0000 mg | Freq: Four times a day (QID) | INTRAMUSCULAR | Status: DC | PRN

## 2018-11-30 MED ORDER — ONDANSETRON HCL 4 MG PO TABS: 4.0000 mg | ORAL_TABLET | Freq: Four times a day (QID) | ORAL | Status: DC | PRN

## 2018-11-30 MED ORDER — DIPHENHYDRAMINE HCL 12.5 MG/5ML PO ELIX: 12.5000 mg | ORAL_SOLUTION | ORAL | Status: DC | PRN

## 2018-11-30 MED ORDER — ONDANSETRON HCL 4 MG/2ML IJ SOLN
INTRAMUSCULAR | Status: AC
  Filled 2018-11-30: qty 2

## 2018-11-30 MED ORDER — MONTELUKAST SODIUM 10 MG PO TABS
10.0000 mg | ORAL_TABLET | Freq: Every day | ORAL | Status: DC
  Administered 2018-12-01: 10 mg via ORAL
  Filled 2018-11-30: qty 1

## 2018-11-30 MED ORDER — DEXAMETHASONE SODIUM PHOSPHATE 10 MG/ML IJ SOLN
10.0000 mg | Freq: Once | INTRAMUSCULAR | Status: AC
  Administered 2018-12-01: 10 mg via INTRAVENOUS
  Filled 2018-11-30: qty 1

## 2018-11-30 MED ORDER — 0.9 % SODIUM CHLORIDE (POUR BTL) OPTIME
TOPICAL | Status: DC | PRN
  Administered 2018-11-30: 1000 mL

## 2018-11-30 MED ORDER — HYDROMORPHONE HCL 1 MG/ML IJ SOLN: 0.2500 mg | INTRAMUSCULAR | Status: DC | PRN

## 2018-11-30 MED ORDER — METOCLOPRAMIDE HCL 5 MG/ML IJ SOLN: 5.0000 mg | Freq: Three times a day (TID) | INTRAMUSCULAR | Status: DC | PRN

## 2018-11-30 MED ORDER — SODIUM CHLORIDE (PF) 0.9 % IJ SOLN
INTRAMUSCULAR | Status: AC
  Filled 2018-11-30: qty 10

## 2018-11-30 MED ORDER — INSULIN ASPART 100 UNIT/ML ~~LOC~~ SOLN
0.0000 [IU] | Freq: Three times a day (TID) | SUBCUTANEOUS | Status: DC
  Administered 2018-11-30: 3 [IU] via SUBCUTANEOUS
  Administered 2018-11-30: 5 [IU] via SUBCUTANEOUS
  Administered 2018-12-01: 3 [IU] via SUBCUTANEOUS

## 2018-11-30 MED ORDER — CEFAZOLIN SODIUM-DEXTROSE 2-4 GM/100ML-% IV SOLN
2.0000 g | Freq: Four times a day (QID) | INTRAVENOUS | Status: AC
  Administered 2018-11-30 (×2): 2 g via INTRAVENOUS
  Filled 2018-11-30 (×2): qty 100

## 2018-11-30 MED ORDER — GABAPENTIN 100 MG PO CAPS
200.0000 mg | ORAL_CAPSULE | Freq: Three times a day (TID) | ORAL | Status: DC
  Administered 2018-11-30 – 2018-12-01 (×5): 200 mg via ORAL
  Filled 2018-11-30 (×5): qty 2

## 2018-11-30 MED ORDER — NITROGLYCERIN 0.4 MG SL SUBL: 0.4000 mg | SUBLINGUAL_TABLET | SUBLINGUAL | Status: DC | PRN

## 2018-11-30 MED ORDER — MIDAZOLAM HCL 5 MG/5ML IJ SOLN
INTRAMUSCULAR | Status: DC | PRN
  Administered 2018-11-30: 2 mg via INTRAVENOUS

## 2018-11-30 MED ORDER — ACETAMINOPHEN 500 MG PO TABS
1000.0000 mg | ORAL_TABLET | Freq: Four times a day (QID) | ORAL | Status: AC
  Administered 2018-11-30 – 2018-12-01 (×4): 1000 mg via ORAL
  Filled 2018-11-30 (×4): qty 2

## 2018-11-30 MED ORDER — CHLORHEXIDINE GLUCONATE 4 % EX LIQD: 60.0000 mL | Freq: Once | CUTANEOUS | Status: DC

## 2018-11-30 MED ORDER — LEVOTHYROXINE SODIUM 150 MCG PO TABS
300.0000 ug | ORAL_TABLET | Freq: Every day | ORAL | Status: DC
  Administered 2018-12-01: 300 ug via ORAL
  Filled 2018-11-30: qty 2

## 2018-11-30 MED ORDER — PHENYLEPHRINE 40 MCG/ML (10ML) SYRINGE FOR IV PUSH (FOR BLOOD PRESSURE SUPPORT)
PREFILLED_SYRINGE | INTRAVENOUS | Status: DC | PRN
  Administered 2018-11-30 (×5): 80 ug via INTRAVENOUS

## 2018-11-30 MED ORDER — ACETAMINOPHEN 10 MG/ML IV SOLN
1000.0000 mg | Freq: Four times a day (QID) | INTRAVENOUS | Status: DC
  Administered 2018-11-30: 1000 mg via INTRAVENOUS

## 2018-11-30 MED ORDER — SODIUM CHLORIDE 0.9 % IR SOLN
Status: DC | PRN
  Administered 2018-11-30: 1000 mL

## 2018-11-30 MED ORDER — BUPIVACAINE LIPOSOME 1.3 % IJ SUSP
INTRAMUSCULAR | Status: DC | PRN
  Administered 2018-11-30: 20 mL

## 2018-11-30 MED ORDER — DEXAMETHASONE SODIUM PHOSPHATE 10 MG/ML IJ SOLN
INTRAMUSCULAR | Status: AC
  Filled 2018-11-30: qty 1

## 2018-11-30 MED ORDER — ONDANSETRON HCL 4 MG/2ML IJ SOLN
INTRAMUSCULAR | Status: DC | PRN
  Administered 2018-11-30: 4 mg via INTRAVENOUS

## 2018-11-30 MED ORDER — BISACODYL 10 MG RE SUPP: 10.0000 mg | Freq: Every day | RECTAL | Status: DC | PRN

## 2018-11-30 MED ORDER — ACETAMINOPHEN 10 MG/ML IV SOLN
INTRAVENOUS | Status: AC
  Filled 2018-11-30: qty 100

## 2018-11-30 MED ORDER — OXYCODONE HCL 5 MG PO TABS
5.0000 mg | ORAL_TABLET | ORAL | Status: DC | PRN
  Administered 2018-11-30 – 2018-12-01 (×2): 10 mg via ORAL
  Filled 2018-11-30 (×2): qty 2

## 2018-11-30 MED ORDER — LOPERAMIDE HCL 2 MG PO CAPS: 4.0000 mg | ORAL_CAPSULE | ORAL | Status: DC | PRN

## 2018-11-30 MED ORDER — MIDAZOLAM HCL 2 MG/2ML IJ SOLN
INTRAMUSCULAR | Status: AC
  Filled 2018-11-30: qty 2

## 2018-11-30 MED ORDER — DOCUSATE SODIUM 100 MG PO CAPS
100.0000 mg | ORAL_CAPSULE | Freq: Two times a day (BID) | ORAL | Status: DC
  Administered 2018-11-30 – 2018-12-01 (×3): 100 mg via ORAL
  Filled 2018-11-30 (×3): qty 1

## 2018-11-30 MED ORDER — EPHEDRINE 5 MG/ML INJ
INTRAVENOUS | Status: AC
  Filled 2018-11-30: qty 10

## 2018-11-30 MED ORDER — CEFAZOLIN SODIUM-DEXTROSE 2-4 GM/100ML-% IV SOLN
2.0000 g | INTRAVENOUS | Status: AC
  Administered 2018-11-30: 2 g via INTRAVENOUS

## 2018-11-30 MED ORDER — ALBUTEROL SULFATE (2.5 MG/3ML) 0.083% IN NEBU: 3.0000 mL | INHALATION_SOLUTION | RESPIRATORY_TRACT | Status: DC | PRN

## 2018-11-30 MED ORDER — EPHEDRINE SULFATE-NACL 50-0.9 MG/10ML-% IV SOSY
PREFILLED_SYRINGE | INTRAVENOUS | Status: DC | PRN
  Administered 2018-11-30 (×3): 5 mg via INTRAVENOUS
  Administered 2018-11-30: 10 mg via INTRAVENOUS
  Administered 2018-11-30: 5 mg via INTRAVENOUS
  Administered 2018-11-30: 10 mg via INTRAVENOUS
  Administered 2018-11-30: 5 mg via INTRAVENOUS

## 2018-11-30 MED ORDER — MENTHOL 3 MG MT LOZG: 1.0000 | LOZENGE | OROMUCOSAL | Status: DC | PRN

## 2018-11-30 MED ORDER — PROPOFOL 500 MG/50ML IV EMUL
INTRAVENOUS | Status: DC | PRN
  Administered 2018-11-30: 40 ug/kg/min via INTRAVENOUS

## 2018-11-30 MED ORDER — METOCLOPRAMIDE HCL 5 MG PO TABS: 5.0000 mg | ORAL_TABLET | Freq: Three times a day (TID) | ORAL | Status: DC | PRN

## 2018-11-30 MED ORDER — ACETAMINOPHEN 160 MG/5ML PO SOLN: 325.0000 mg | Freq: Once | ORAL | Status: DC | PRN

## 2018-11-30 MED ORDER — ASPIRIN EC 325 MG PO TBEC
325.0000 mg | DELAYED_RELEASE_TABLET | Freq: Two times a day (BID) | ORAL | Status: DC
  Administered 2018-12-01: 325 mg via ORAL
  Filled 2018-11-30: qty 1

## 2018-11-30 MED ORDER — AMLODIPINE BESYLATE 10 MG PO TABS
10.0000 mg | ORAL_TABLET | Freq: Every day | ORAL | Status: DC
  Administered 2018-11-30: 10 mg via ORAL
  Filled 2018-11-30: qty 1

## 2018-11-30 MED ORDER — ROPIVACAINE HCL 7.5 MG/ML IJ SOLN
INTRAMUSCULAR | Status: DC | PRN
  Administered 2018-11-30: 20 mL via PERINEURAL

## 2018-11-30 MED ORDER — ACETAMINOPHEN 10 MG/ML IV SOLN: 1000.0000 mg | Freq: Once | INTRAVENOUS | Status: DC | PRN

## 2018-11-30 MED ORDER — PHENOL 1.4 % MT LIQD: 1.0000 | OROMUCOSAL | Status: DC | PRN

## 2018-11-30 SURGICAL SUPPLY — 57 items
ATTUNE PS FEM LT SZ 5 CEM KNEE (Femur) ×2 IMPLANT
ATTUNE PSRP INSR SZ5 8 KNEE (Insert) ×2 IMPLANT
BAG ZIPLOCK 12X15 (MISCELLANEOUS) ×2 IMPLANT
BASEPLATE TIBIAL ROTATING SZ 4 (Knees) ×2 IMPLANT
BLADE SAG 18X100X1.27 (BLADE) ×2 IMPLANT
BLADE SAW SGTL 11.0X1.19X90.0M (BLADE) ×2 IMPLANT
BLADE SURG SZ10 CARB STEEL (BLADE) ×4 IMPLANT
BNDG ELASTIC 6X5.8 VLCR STR LF (GAUZE/BANDAGES/DRESSINGS) ×2 IMPLANT
BOWL SMART MIX CTS (DISPOSABLE) ×2 IMPLANT
CEMENT HV SMART SET (Cement) ×4 IMPLANT
COVER SURGICAL LIGHT HANDLE (MISCELLANEOUS) ×2 IMPLANT
COVER WAND RF STERILE (DRAPES) IMPLANT
CUFF TOURN SGL QUICK 34 (TOURNIQUET CUFF) ×1
CUFF TRNQT CYL 34X4.125X (TOURNIQUET CUFF) ×1 IMPLANT
DECANTER SPIKE VIAL GLASS SM (MISCELLANEOUS) ×4 IMPLANT
DRAPE U-SHAPE 47X51 STRL (DRAPES) ×2 IMPLANT
DRSG ADAPTIC 3X8 NADH LF (GAUZE/BANDAGES/DRESSINGS) ×2 IMPLANT
DRSG PAD ABDOMINAL 8X10 ST (GAUZE/BANDAGES/DRESSINGS) ×2 IMPLANT
DURAPREP 26ML APPLICATOR (WOUND CARE) ×2 IMPLANT
ELECT REM PT RETURN 15FT ADLT (MISCELLANEOUS) ×2 IMPLANT
EVACUATOR 1/8 PVC DRAIN (DRAIN) ×2 IMPLANT
GAUZE SPONGE 4X4 12PLY STRL (GAUZE/BANDAGES/DRESSINGS) ×2 IMPLANT
GLOVE BIO SURGEON STRL SZ7 (GLOVE) ×2 IMPLANT
GLOVE BIO SURGEON STRL SZ8 (GLOVE) ×2 IMPLANT
GLOVE BIOGEL PI IND STRL 6.5 (GLOVE) ×2 IMPLANT
GLOVE BIOGEL PI IND STRL 7.0 (GLOVE) ×1 IMPLANT
GLOVE BIOGEL PI IND STRL 8 (GLOVE) ×1 IMPLANT
GLOVE BIOGEL PI INDICATOR 6.5 (GLOVE) ×2
GLOVE BIOGEL PI INDICATOR 7.0 (GLOVE) ×1
GLOVE BIOGEL PI INDICATOR 8 (GLOVE) ×1
GLOVE SURG SS PI 6.5 STRL IVOR (GLOVE) ×2 IMPLANT
GOWN STRL REUS W/TWL LRG LVL3 (GOWN DISPOSABLE) ×6 IMPLANT
HANDPIECE INTERPULSE COAX TIP (DISPOSABLE) ×1
HOLDER FOLEY CATH W/STRAP (MISCELLANEOUS) ×2 IMPLANT
IMMOBILIZER KNEE 20 (SOFTGOODS) ×2
IMMOBILIZER KNEE 20 THIGH 36 (SOFTGOODS) ×1 IMPLANT
KIT TURNOVER KIT A (KITS) IMPLANT
MANIFOLD NEPTUNE II (INSTRUMENTS) ×2 IMPLANT
NS IRRIG 1000ML POUR BTL (IV SOLUTION) ×2 IMPLANT
PACK TOTAL KNEE CUSTOM (KITS) ×2 IMPLANT
PADDING CAST COTTON 6X4 STRL (CAST SUPPLIES) ×4 IMPLANT
PATELLA MEDIAL ATTUN 35MM KNEE (Knees) ×2 IMPLANT
PENCIL SMOKE EVAC ROCKERSWITCH (MISCELLANEOUS) ×2 IMPLANT
PIN STEINMAN FIXATION KNEE (PIN) ×2 IMPLANT
PROTECTOR NERVE ULNAR (MISCELLANEOUS) ×2 IMPLANT
SET HNDPC FAN SPRY TIP SCT (DISPOSABLE) ×1 IMPLANT
SLEEVE SURGEON STRL (DRAPES) ×2 IMPLANT
STRIP CLOSURE SKIN 1/2X4 (GAUZE/BANDAGES/DRESSINGS) ×4 IMPLANT
SUT MNCRL AB 4-0 PS2 18 (SUTURE) ×2 IMPLANT
SUT STRATAFIX 0 PDS 27 VIOLET (SUTURE) ×2
SUT VIC AB 2-0 CT1 27 (SUTURE) ×3
SUT VIC AB 2-0 CT1 TAPERPNT 27 (SUTURE) ×3 IMPLANT
SUTURE STRATFX 0 PDS 27 VIOLET (SUTURE) ×1 IMPLANT
TRAY FOLEY MTR SLVR 14FR STAT (SET/KITS/TRAYS/PACK) ×2 IMPLANT
WATER STERILE IRR 1000ML POUR (IV SOLUTION) ×4 IMPLANT
WRAP KNEE MAXI GEL POST OP (GAUZE/BANDAGES/DRESSINGS) ×2 IMPLANT
YANKAUER SUCT BULB TIP 10FT TU (MISCELLANEOUS) ×2 IMPLANT

## 2018-11-30 NOTE — Anesthesia Postprocedure Evaluation (Signed)
Anesthesia Post Note  Patient: [name]  Procedure(s) Performed: TOTAL KNEE ARTHROPLASTY (Left Knee)     Patient location during evaluation: PACU Anesthesia Type: Spinal Level of consciousness: oriented and awake and alert Pain management: pain level controlled Vital Signs Assessment: post-procedure vital signs reviewed and stable Respiratory status: spontaneous breathing, respiratory function stable and patient connected to nasal cannula oxygen Cardiovascular status: blood pressure returned to baseline and stable Postop Assessment: no headache, no backache and no apparent nausea or vomiting Anesthetic complications: no    Last Vitals:  Vitals:   11/30/18 0945 11/30/18 1006  BP: 124/66 123/89  Pulse: 60 (!) 57  Resp: 17 17  Temp: 36.4 C 36.4 C  SpO2: 95% 99%    Last Pain:  Vitals:   11/30/18 1006  TempSrc: Oral  PainSc:                  [name]

## 2018-11-30 NOTE — Discharge Instructions (Addendum)
° °Dr. Frank Aluisio °Total Joint Specialist °[facility] °[address] °[place], [place] [address] °[phone] ° °TOTAL KNEE REPLACEMENT POSTOPERATIVE DIRECTIONS ° °Knee Rehabilitation, Guidelines Following Surgery  °Results after knee surgery are often greatly improved when you follow the exercise, range of motion and muscle strengthening exercises prescribed by your doctor. Safety measures are also important to protect the knee from further injury. Any time any of these exercises cause you to have increased pain or swelling in your knee joint, decrease the amount until you are comfortable again and slowly increase them. If you have problems or questions, call your caregiver or physical therapist for advice.  ° °HOME CARE INSTRUCTIONS  °Remove items at home which could result in a fall. This includes throw rugs or furniture in walking pathways.  °· ICE to the affected knee every three hours for 30 minutes at a time and then as needed for pain and swelling.  Continue to use ice on the knee for pain and swelling from surgery. You may notice swelling that will progress down to the foot and ankle.  This is normal after surgery.  Elevate the leg when you are not up walking on it.   °· Continue to use the breathing machine which will help keep your temperature down.  It is common for your temperature to cycle up and down following surgery, especially at night when you are not up moving around and exerting yourself.  The breathing machine keeps your lungs expanded and your temperature down. °· Do not place pillow under knee, focus on keeping the knee straight while resting ° °DIET °You may resume your previous home diet once your are discharged from the hospital. ° °DRESSING / WOUND CARE / SHOWERING °You may shower 3 days after surgery, but keep the wounds dry during showering.  You may use an occlusive plastic wrap (Press'n Seal for example), NO SOAKING/SUBMERGING IN THE BATHTUB.  If the bandage gets  wet, change with a clean dry gauze.  If the incision gets wet, pat the wound dry with a clean towel. °You may start showering once you are discharged home but do not submerge the incision under water. Just pat the incision dry and apply a dry gauze dressing on daily. °Change the surgical dressing daily and reapply a dry dressing each time. ° °ACTIVITY °Walk with your walker as instructed. °Use walker as long as suggested by your caregivers. °Avoid periods of inactivity such as sitting longer than an hour when not asleep. This helps prevent blood clots.  °You may resume a sexual relationship in one month or when given the OK by your doctor.  °You may return to work once you are cleared by your doctor.  °Do not drive a car for 6 weeks or until released by you surgeon.  °Do not drive while taking narcotics. ° °WEIGHT BEARING °Weight bearing as tolerated with assist device (walker, cane, etc) as directed, use it as long as suggested by your surgeon or therapist, typically at least 4-6 weeks. ° °POSTOPERATIVE CONSTIPATION PROTOCOL °Constipation - defined medically as fewer than three stools per week and severe constipation as less than one stool per week. ° °One of the most common issues patients have following surgery is constipation.  Even if you have a regular bowel pattern at home, your normal regimen is likely to be disrupted due to multiple reasons following surgery.  Combination of anesthesia, postoperative narcotics, change in appetite and fluid intake all can affect your bowels.    In order to avoid complications following surgery, here are some recommendations in order to help you during your recovery period. ° °Colace (docusate) - Pick up an over-the-counter form of Colace or another stool softener and take twice a day as long as you are requiring postoperative pain medications.  Take with a full glass of water daily.  If you experience loose stools or diarrhea, hold the colace until you stool forms back up.  If  your symptoms do not get better within 1 week or if they get worse, check with your doctor. ° °Dulcolax (bisacodyl) - Pick up over-the-counter and take as directed by the product packaging as needed to assist with the movement of your bowels.  Take with a full glass of water.  Use this product as needed if not relieved by Colace only.  ° °MiraLax (polyethylene glycol) - Pick up over-the-counter to have on hand.  MiraLax is a solution that will increase the amount of water in your bowels to assist with bowel movements.  Take as directed and can mix with a glass of water, juice, soda, coffee, or tea.  Take if you go more than two days without a movement. °Do not use MiraLax more than once per day. Call your doctor if you are still constipated or irregular after using this medication for 7 days in a row. ° °If you continue to have problems with postoperative constipation, please contact the office for further assistance and recommendations.  If you experience "the worst abdominal pain ever" or develop nausea or vomiting, please contact the office immediatly for further recommendations for treatment. ° °ITCHING ° If you experience itching with your medications, try taking only a single pain pill, or even half a pain pill at a time.  You can also use Benadryl over the counter for itching or also to help with sleep.  ° °TED HOSE STOCKINGS °Wear the elastic stockings on both legs for three weeks following surgery during the day but you may remove then at night for sleeping. ° °MEDICATIONS °See your medication summary on the “After Visit Summary” that the nursing staff will review with you prior to discharge.  You may have some home medications which will be placed on hold until you complete the course of blood thinner medication.  It is important for you to complete the blood thinner medication as prescribed by your surgeon.  Continue your approved medications as instructed at time of discharge. ° °Gabapentin 200 mg  Protocol °Take a 200 mg capsule three times a day for two weeks, °Then a 200 mg capsule twice a day for two weeks, °Then a 200 mg capsule once a day for two weeks, then discontinue the Gabapentin. ° °PRECAUTIONS °If you experience chest pain or shortness of breath - call 911 immediately for transfer to the hospital emergency department.  °If you develop a fever greater that 101 F, purulent drainage from wound, increased redness or drainage from wound, foul odor from the wound/dressing, or calf pain - CONTACT YOUR SURGEON.   °                                                °FOLLOW-UP APPOINTMENTS °Make sure you keep all of your appointments after your operation with your surgeon and caregivers. You should call the office at the above phone number and make an appointment for   approximately two weeks after the date of your surgery or on the date instructed by your surgeon outlined in the "After Visit Summary". ° ° °RANGE OF MOTION AND STRENGTHENING EXERCISES  °Rehabilitation of the knee is important following a knee injury or an operation. After just a few days of immobilization, the muscles of the thigh which control the knee become weakened and shrink (atrophy). Knee exercises are designed to build up the tone and strength of the thigh muscles and to improve knee motion. Often times heat used for twenty to thirty minutes before working out will loosen up your tissues and help with improving the range of motion but do not use heat for the first two weeks following surgery. These exercises can be done on a training (exercise) mat, on the floor, on a table or on a bed. Use what ever works the best and is most comfortable for you Knee exercises include:  °Leg Lifts - While your knee is still immobilized in a splint or cast, you can do straight leg raises. Lift the leg to 60 degrees, hold for 3 sec, and slowly lower the leg. Repeat 10-20 times 2-3 times daily. Perform this exercise against resistance later as your knee  gets better.  °Quad and Hamstring Sets - Tighten up the muscle on the front of the thigh (Quad) and hold for 5-10 sec. Repeat this 10-20 times hourly. Hamstring sets are done by pushing the foot backward against an object and holding for 5-10 sec. Repeat as with quad sets.  °· Leg Slides: Lying on your back, slowly slide your foot toward your buttocks, bending your knee up off the floor (only go as far as is comfortable). Then slowly slide your foot back down until your leg is flat on the floor again. °· Angel Wings: Lying on your back spread your legs to the side as far apart as you can without causing discomfort.  °A rehabilitation program following serious knee injuries can speed recovery and prevent re-injury in the future due to weakened muscles. Contact your doctor or a physical therapist for more information on knee rehabilitation.  ° °IF YOU ARE TRANSFERRED TO A SKILLED REHAB FACILITY °If the patient is transferred to a skilled rehab facility following release from the hospital, a list of the current medications will be sent to the facility for the patient to continue.  When discharged from the skilled rehab facility, please have the facility set up the patient's Home Health Physical Therapy prior to being released. Also, the skilled facility will be responsible for providing the patient with their medications at time of release from the facility to include their pain medication, the muscle relaxants, and their blood thinner medication. If the patient is still at the rehab facility at time of the two week follow up appointment, the skilled rehab facility will also need to assist the patient in arranging follow up appointment in our office and any transportation needs. ° °MAKE SURE YOU:  °Understand these instructions.  °Get help right away if you are not doing well or get worse.  ° ° °Pick up stool softner and laxative for home use following surgery while on pain medications. °Do not submerge incision under  water. °Please use good hand washing techniques while changing dressing each day. °May shower starting three days after surgery. °Please use a clean towel to pat the incision dry following showers. °Continue to use ice for pain and swelling after surgery. °Do not use any lotions or creams on the   incision until instructed by your surgeon. °

## 2018-11-30 NOTE — Anesthesia Procedure Notes (Addendum)
Spinal  Start time: 11/30/2018 7:18 AM End time: 11/30/2018 7:22 AM Staffing Anesthesiologist: [name], MD Performed: anesthesiologist  Preanesthetic Checklist Completed: patient identified, site marked, surgical consent, pre-op evaluation, timeout performed, IV checked, risks and benefits discussed and monitors and equipment checked Spinal Block Patient position: sitting Prep: site prepped and draped and DuraPrep Location: L3-4 Injection technique: single-shot Needle Needle type: Pencan  Needle gauge: 24 G Needle length: 10 cm Needle insertion depth: 10 cm Additional Notes Patient tolerated well. No immediate complications.

## 2018-11-30 NOTE — Evaluation (Signed)
Physical Therapy Evaluation Patient Details Name: [name] MRN: [id number] DOB: Jun 23, 1940 Today's Date: 11/30/2018   History of Present Illness  Patient is 78 y.o. female s/p Lt TKA on 11/30/18 with PMH significant for HTN, DM, CAD, OA, hypothyroidism.  Clinical Impression  [name] is a 78 y.o. female POD 0 s/p Lt TKA. Patient reports independence with mobility at baseline. Patient is now limited by functional impairments (see PT problem list below) and requires min assist for transfers and gait with RW. Patient was able to ambulate ~80 feet with RW and min assist and required cues to maintain safe proximity and use of RW throughout. Patient instructed in exercise to facilitate ROM and circulation to manage edema. Patient will benefit from continued skilled PT interventions to address impairments and progress towards PLOF. Acute PT will follow to progress mobility and stair training in preparation for safe discharge home.    Follow Up Recommendations Follow surgeon's recommendation for DC plan and follow-up therapies    Equipment Recommendations  None recommended by PT    Recommendations for Other Services       Precautions / Restrictions Precautions Precautions: Fall Restrictions Weight Bearing Restrictions: No LLE Weight Bearing: Weight bearing as tolerated      Mobility  Bed Mobility Overal bed mobility: Needs Assistance Bed Mobility: Supine to Sit     Supine to sit: Supervision;HOB elevated     General bed mobility comments: pt using bed rails to assist, no cues needed  Transfers Overall transfer level: Needs assistance Equipment used: Rolling walker (2 wheeled) Transfers: Sit to/from Stand Sit to Stand: Min assist         General transfer comment: cues for safe hand placement and technique with RW, assist required to initiate and complete power up  Ambulation/Gait Ambulation/Gait assistance: Min assist   Assistive device: Rolling walker (2  wheeled) Gait Pattern/deviations: Step-to pattern;Decreased stride length;Decreased weight shift to left;Decreased stance time - left;Decreased step length - left Gait velocity: decreased   General Gait Details: pt required cues initially for safe step pattern with RW and cues to maintain safe proximity to RW as she had tendency to step too close to RW initially  Science writer    Modified Rankin (Stroke Patients Only)       Balance Overall balance assessment: Needs assistance Sitting-balance support: Feet supported;No upper extremity supported Sitting balance-Leahy Scale: Good     Standing balance support: During functional activity;Bilateral upper extremity supported Standing balance-Leahy Scale: Fair            Pertinent Vitals/Pain Pain Assessment: 0-10 Pain Score: 4  Pain Location: Lt knee Pain Descriptors / Indicators: Aching;Sore Pain Intervention(s): Limited activity within patient's tolerance;Monitored during session;Repositioned    Home Living Family/patient expects to be discharged to:: Private residence Living Arrangements: Alone Available Help at Discharge: Family;Friend(s);Available 24 hours/day Type of Home: House   Entrance Stairs-Rails: Right Entrance Stairs-Number of Steps: 1 threshold into laundry room and then 3 steps with Rt hand rail going up; 4-5 at front with 2 hand rails Home Layout: One level Home Equipment: Walker - 2 wheels;Shower seat;Grab bars - tub/shower;Cane - single point;Bedside commode Additional Comments: pt's daughter Nevin Bloodgood will stay through wednesday morning and then another family member will take over evenings and pt's grand daughter will help during the day.    Prior Function Level of Independence: Independent  Hand Dominance   Dominant Hand: Right    Extremity/Trunk Assessment   Upper Extremity Assessment Upper Extremity Assessment: Overall WFL for tasks assessed     Lower Extremity Assessment Lower Extremity Assessment: LLE deficits/detail LLE Deficits / Details: pt with good quad activation and no extensor lag with SLR, 4/5 for quad strength with MMT LLE Sensation: WNL LLE Coordination: WNL    Cervical / Trunk Assessment Cervical / Trunk Assessment: Normal  Communication   Communication: No difficulties  Cognition Arousal/Alertness: Awake/alert Behavior During Therapy: WFL for tasks assessed/performed Overall Cognitive Status: Within Functional Limits for tasks assessed                 General Comments      Exercises Total Joint Exercises Ankle Circles/Pumps: AROM;15 reps;Seated;Both Quad Sets: AROM;10 reps;Left;Seated Heel Slides: AAROM;10 reps;Seated;Left   Assessment/Plan    PT Assessment Patient needs continued PT services  PT Problem List Decreased strength;Decreased range of motion;Decreased balance;Decreased mobility;Decreased activity tolerance;Decreased knowledge of use of DME       PT Treatment Interventions DME instruction;Stair training;Therapeutic exercise;Therapeutic activities;Gait training;Functional mobility training;Balance training;Patient/family education;Modalities    PT Goals (Current goals can be found in the Care Plan section)       Frequency 7X/week    AM-PAC PT "6 Clicks" Mobility  Outcome Measure Help needed turning from your back to your side while in a flat bed without using bedrails?: A Little Help needed moving from lying on your back to sitting on the side of a flat bed without using bedrails?: A Little Help needed moving to and from a bed to a chair (including a wheelchair)?: A Little Help needed standing up from a chair using your arms (e.g., wheelchair or bedside chair)?: A Little Help needed to walk in hospital room?: A Little Help needed climbing 3-5 steps with a railing? : A Little 6 Click Score: 18    End of Session Equipment Utilized During Treatment: Gait belt Activity  Tolerance: Patient tolerated treatment well Patient left: in chair;with call bell/phone within reach;with chair alarm set;with family/visitor present Nurse Communication: Mobility status PT Visit Diagnosis: Muscle weakness (generalized) (M62.81);Difficulty in walking, not elsewhere classified (R26.2)    Time: [id number] PT Time Calculation (min) (ACUTE ONLY): 36 min   Charges:   PT Evaluation $PT Eval Low Complexity: 1 Low PT Treatments $Therapeutic Exercise: 8-22 mins        Kipp Brood, PT, DPT Physical Therapist with [facility]  11/30/2018 6:58 PM

## 2018-11-30 NOTE — Interval H&P Note (Signed)
History and Physical Interval Note:  11/30/2018 6:48 AM  [name]  has presented today for surgery, with the diagnosis of left knee osteoarthritis.  The various methods of treatment have been discussed with the patient and family. After consideration of risks, benefits and other options for treatment, the patient has consented to  Procedure(s): TOTAL KNEE ARTHROPLASTY (Left) as a surgical intervention.  The patient's history has been reviewed, patient examined, no change in status, stable for surgery.  I have reviewed the patient's chart and labs.  Questions were answered to the patient's satisfaction.     Pilar Plate Myisha Pickerel

## 2018-11-30 NOTE — Transfer of Care (Signed)
Immediate Anesthesia Transfer of Care Note  Patient: [name]  Procedure(s) Performed: TOTAL KNEE ARTHROPLASTY (Left Knee)  Patient Location: PACU  Anesthesia Type:MAC and Spinal  Level of Consciousness: awake, alert , oriented and patient cooperative  Airway & Oxygen Therapy: Patient Spontanous Breathing and Patient connected to face mask oxygen  Post-op Assessment: Report given to RN and Post -op Vital signs reviewed and stable  Post vital signs: Reviewed and stable  Last Vitals:  Vitals Value Taken Time  BP 94/55 11/30/18 0840  Temp    Pulse 57 11/30/18 0842  Resp 14 11/30/18 0842  SpO2 97 % 11/30/18 0842  Vitals shown include unvalidated device data.  Last Pain:  Vitals:   11/30/18 0625  TempSrc: Oral  PainSc:          Complications: No apparent anesthesia complications

## 2018-11-30 NOTE — Anesthesia Procedure Notes (Signed)
Anesthesia Regional Block: Adductor canal block   Pre-Anesthetic Checklist: ,, timeout performed, Correct Patient, Correct Site, Correct Laterality, Correct Procedure, Correct Position, site marked, Risks and benefits discussed,  Surgical consent,  Pre-op evaluation,  At surgeon's request and post-op pain management  Laterality: Left  Prep: chloraprep       Needles:  Injection technique: Single-shot  Needle Type: Echogenic Stimulator Needle     Needle Length: 9cm  Needle Gauge: 21     Additional Needles:   Procedures:,,,, ultrasound used (permanent image in chart),,,,  Narrative:  Start time: 11/30/2018 6:55 AM End time: 11/30/2018 7:05 AM Injection made incrementally with aspirations every 5 mL.  Performed by: Personally  Anesthesiologist: [name], MD  Additional Notes: Patient tolerated the procedure well. Local anesthetic introduced in an incremental fashion under minimal resistance after negative aspirations. No paresthesias were elicited. After completion of the procedure, no acute issues were identified and patient continued to be monitored by RN.

## 2018-11-30 NOTE — Op Note (Signed)
OPERATIVE REPORT-TOTAL KNEE ARTHROPLASTY   Pre-operative diagnosis- Osteoarthritis  Left knee(s)  Post-operative diagnosis- Osteoarthritis Left knee(s)  Procedure-  Left  Total Knee Arthroplasty (Depuy Attune)  Surgeon- Dione Plover. Tatum Corl, MD  Assistant- Ardeen Jourdain PA-C   Anesthesia-  Adductor canal block and spinal  EBL-50 mL   Drains Hemovac  Tourniquet time-  Total Tourniquet Time Documented: Thigh (Left) - 33 minutes Total: Thigh (Left) - 33 minutes     Complications- None  Condition-PACU - hemodynamically stable.   Brief Clinical Note  [name] is a 78 y.o. year old female with end stage OA of her left knee with progressively worsening pain and dysfunction. She has constant pain, with activity and at rest and significant functional deficits with difficulties even with ADLs. She has had extensive non-op management including analgesics, injections of cortisone and viscosupplements, and home exercise program, but remains in significant pain with significant dysfunction. Radiographs show bone on bone arthritis medial and patellofemoral. She presents now for left Total Knee Arthroplasty.    Procedure in detail---   The patient is brought into the operating room and positioned supine on the operating table. After successful administration of  Adductor canal block and spinal,   a tourniquet is placed high on the  Left thigh(s) and the lower extremity is prepped and draped in the usual sterile fashion. Time out is performed by the operating team and then the  Left lower extremity is wrapped in Esmarch, knee flexed and the tourniquet inflated to 300 mmHg.       A midline incision is made with a ten blade through the subcutaneous tissue to the level of the extensor mechanism. A fresh blade is used to make a medial parapatellar arthrotomy. Soft tissue over the proximal medial tibia is subperiosteally elevated to the joint line with a knife and into the semimembranosus  bursa with a Cobb elevator. Soft tissue over the proximal lateral tibia is elevated with attention being paid to avoiding the patellar tendon on the tibial tubercle. The patella is everted, knee flexed 90 degrees and the ACL and PCL are removed. Findings are bone on bone medial and patellofemoral with large global osteophytes.        The drill is used to create a starting hole in the distal femur and the canal is thoroughly irrigated with sterile saline to remove the fatty contents. The 5 degree Left  valgus alignment guide is placed into the femoral canal and the distal femoral cutting block is pinned to remove 9 mm off the distal femur. Resection is made with an oscillating saw.      The tibia is subluxed forward and the menisci are removed. The extramedullary alignment guide is placed referencing proximally at the medial aspect of the tibial tubercle and distally along the second metatarsal axis and tibial crest. The block is pinned to remove 89mm off the more deficient medial  side. Resection is made with an oscillating saw. Size 4is the most appropriate size for the tibia and the proximal tibia is prepared with the modular drill and keel punch for that size.      The femoral sizing guide is placed and size 5 is most appropriate. Rotation is marked off the epicondylar axis and confirmed by creating a rectangular flexion gap at 90 degrees. The size 5 cutting block is pinned in this rotation and the anterior, posterior and chamfer cuts are made with the oscillating saw. The intercondylar block is then placed and that cut is  made.      Trial size 4 tibial component, trial size 5 posterior stabilized femur and a 8  mm posterior stabilized rotating platform insert trial is placed. Full extension is achieved with excellent varus/valgus and anterior/posterior balance throughout full range of motion. The patella is everted and thickness measured to be 22  mm. Free hand resection is taken to 12 mm, a 35 template is  placed, lug holes are drilled, trial patella is placed, and it tracks normally. Osteophytes are removed off the posterior femur with the trial in place. All trials are removed and the cut bone surfaces prepared with pulsatile lavage. Cement is mixed and once ready for implantation, the size 4 tibial implant, size  5 posterior stabilized femoral component, and the size 32 patella are cemented in place and the patella is held with the clamp. The trial insert is placed and the knee held in full extension. The Exparel (20 ml mixed with 60 ml saline) is injected into the extensor mechanism, posterior capsule, medial and lateral gutters and subcutaneous tissues.  All extruded cement is removed and once the cement is hard the permanent 8 mm posterior stabilized rotating platform insert is placed into the tibial tray.      The wound is copiously irrigated with saline solution and the extensor mechanism closed over a hemovac drain with #1 V-loc suture. The tourniquet is released for a total tourniquet time of 33  minutes. Flexion against gravity is 140 degrees and the patella tracks normally. Subcutaneous tissue is closed with 2.0 vicryl and subcuticular with running 4.0 Monocryl. The incision is cleaned and dried and steri-strips and a bulky sterile dressing are applied. The limb is placed into a knee immobilizer and the patient is awakened and transported to recovery in stable condition.      Please note that a surgical assistant was a medical necessity for this procedure in order to perform it in a safe and expeditious manner. Surgical assistant was necessary to retract the ligaments and vital neurovascular structures to prevent injury to them and also necessary for proper positioning of the limb to allow for anatomic placement of the prosthesis.   Dione Plover Luiscarlos Kaczmarczyk, MD    11/30/2018, 8:20 AM

## 2018-12-01 ENCOUNTER — Encounter (HOSPITAL_COMMUNITY): Payer: Self-pay | Admitting: Orthopedic Surgery

## 2018-12-01 DIAGNOSIS — J45909 Unspecified asthma, uncomplicated: Secondary | ICD-10-CM | POA: Diagnosis not present

## 2018-12-01 DIAGNOSIS — E039 Hypothyroidism, unspecified: Secondary | ICD-10-CM | POA: Diagnosis not present

## 2018-12-01 DIAGNOSIS — D649 Anemia, unspecified: Secondary | ICD-10-CM | POA: Diagnosis not present

## 2018-12-01 DIAGNOSIS — M1712 Unilateral primary osteoarthritis, left knee: Secondary | ICD-10-CM | POA: Diagnosis not present

## 2018-12-01 DIAGNOSIS — I10 Essential (primary) hypertension: Secondary | ICD-10-CM | POA: Diagnosis not present

## 2018-12-01 DIAGNOSIS — E119 Type 2 diabetes mellitus without complications: Secondary | ICD-10-CM | POA: Diagnosis not present

## 2018-12-01 DIAGNOSIS — Z7982 Long term (current) use of aspirin: Secondary | ICD-10-CM | POA: Diagnosis not present

## 2018-12-01 DIAGNOSIS — I2510 Atherosclerotic heart disease of native coronary artery without angina pectoris: Secondary | ICD-10-CM | POA: Diagnosis not present

## 2018-12-01 DIAGNOSIS — Z7901 Long term (current) use of anticoagulants: Secondary | ICD-10-CM | POA: Diagnosis not present

## 2018-12-01 LAB — BASIC METABOLIC PANEL
Anion gap: 7 (ref 5–15)
BUN: 10 mg/dL (ref 8–23)
CO2: 26 mmol/L (ref 22–32)
Calcium: 9.7 mg/dL (ref 8.9–10.3)
Chloride: 102 mmol/L (ref 98–111)
Creatinine, Ser: 0.69 mg/dL (ref 0.44–1.00)
GFR calc Af Amer: >60 mL/min (ref >60)
GFR calc non Af Amer: >60 mL/min (ref >60)
Glucose, Bld: 144 mg/dL — ABNORMAL HIGH (ref 70–99)
Potassium: 4.2 mmol/L (ref 3.5–5.1)
Sodium: 135 mmol/L (ref 135–145)

## 2018-12-01 LAB — CBC
HCT: 33.3 % — ABNORMAL LOW (ref 36.0–46.0)
Hemoglobin: 10.5 g/dL — ABNORMAL LOW (ref 12.0–15.0)
MCH: 29.7 pg (ref 26.0–34.0)
MCHC: 31.5 g/dL (ref 30.0–36.0)
MCV: 94.1 fL (ref 80.0–100.0)
Platelets: 152 10*3/uL (ref 150–400)
RBC: 3.54 MIL/uL — ABNORMAL LOW (ref 3.87–5.11)
RDW: 12.7 % (ref 11.5–15.5)
WBC: 10.5 10*3/uL (ref 4.0–10.5)
nRBC: 0 % (ref 0.0–0.2)

## 2018-12-01 LAB — GLUCOSE, CAPILLARY
Glucose-Capillary: 111 mg/dL — ABNORMAL HIGH (ref 70–99)
Glucose-Capillary: 159 mg/dL — ABNORMAL HIGH (ref 70–99)

## 2018-12-01 MED ORDER — METHOCARBAMOL 500 MG PO TABS: 500.0000 mg | ORAL_TABLET | Freq: Four times a day (QID) | ORAL | 0 refills | Status: DC | PRN

## 2018-12-01 MED ORDER — GABAPENTIN 100 MG PO CAPS: 200.0000 mg | ORAL_CAPSULE | Freq: Three times a day (TID) | ORAL | 0 refills | Status: DC

## 2018-12-01 MED ORDER — HYDROMORPHONE HCL 2 MG PO TABS
2.0000 mg | ORAL_TABLET | ORAL | Status: DC | PRN
  Administered 2018-12-01: 4 mg via ORAL
  Administered 2018-12-01: 2 mg via ORAL
  Filled 2018-12-01: qty 2
  Filled 2018-12-01: qty 1
  Filled 2018-12-01: qty 2

## 2018-12-01 MED ORDER — HYDROMORPHONE HCL 2 MG PO TABS: 2.0000 mg | ORAL_TABLET | Freq: Four times a day (QID) | ORAL | 0 refills | Status: DC | PRN

## 2018-12-01 MED ORDER — ASPIRIN 325 MG PO TBEC: 325.0000 mg | DELAYED_RELEASE_TABLET | Freq: Two times a day (BID) | ORAL | 0 refills | Status: AC

## 2018-12-01 NOTE — Progress Notes (Signed)
Physical Therapy Treatment Patient Details Name: [name] MRN: [id number] DOB: [birth year] Today's Date: 12/01/2018    History of Present Illness Patient is 78 y.o. female s/p Lt TKA on 11/30/18 with PMH significant for HTN, DM, CAD, OA, hypothyroidism.    PT Comments    Pt had c/o significant pain, so was premedicated and PT followed up after 30-40 minutes.  Pain had improved but still limited therapy.  Pt required cues for technique and min-mod A for SLR (able to perform 1 but needed assist for multiple), SAQ, and heel slides.   She ambulated a short distance with min A.  Will benefit from further therapy prior to d/c home.   Follow Up Recommendations  Follow surgeon's recommendation for DC plan and follow-up therapies     Equipment Recommendations  None recommended by PT    Recommendations for Other Services       Precautions / Restrictions Precautions Precautions: Fall Restrictions Weight Bearing Restrictions: No LLE Weight Bearing: Weight bearing as tolerated Other Position/Activity Restrictions: able to SLR-knee immobilizer not used    Mobility  Bed Mobility Overal bed mobility: Needs Assistance Bed Mobility: Supine to Sit     Supine to sit: Min assist;HOB elevated     General bed mobility comments: cues for technique  Transfers Overall transfer level: Needs assistance Equipment used: Rolling walker (2 wheeled) Transfers: Sit to/from Stand Sit to Stand: Min assist;From elevated surface         General transfer comment: cues for safe hand placement  Ambulation/Gait Ambulation/Gait assistance: Min Web designer (Feet): 25 Feet Assistive device: Rolling walker (2 wheeled) Gait Pattern/deviations: Step-to pattern;Decreased stride length;Decreased weight shift to left;Decreased stance time - left;Decreased step length - left     General Gait Details: limited distance due to pain; cues for RW   Stairs             Wheelchair  Mobility    Modified Rankin (Stroke Patients Only)       Balance Overall balance assessment: Needs assistance Sitting-balance support: Feet supported;No upper extremity supported Sitting balance-Leahy Scale: Good     Standing balance support: During functional activity;Bilateral upper extremity supported Standing balance-Leahy Scale: Fair                              Cognition Arousal/Alertness: Awake/alert Behavior During Therapy: WFL for tasks assessed/performed Overall Cognitive Status: Within Functional Limits for tasks assessed                                        Exercises Total Joint Exercises Ankle Circles/Pumps: AROM;15 reps;Both;Supine Quad Sets: AROM;Left;10 reps;Supine Towel Squeeze: AROM;Left;10 reps;Supine Short Arc Quad: Supine;Left;10 reps;AAROM Heel Slides: AAROM;Supine;Left;10 reps Hip ABduction/ADduction: AAROM;Supine;Left;10 reps Straight Leg Raises: AAROM;Supine;Left;10 reps Knee Flexion: AAROM;Seated;10 reps;Left Goniometric ROM: Lknee lacking 5 ext; 60 flex    General Comments        Pertinent Vitals/Pain Pain Assessment: 0-10 Pain Score: 4  Pain Location: Lt knee Pain Descriptors / Indicators: Aching;Sore Pain Intervention(s): Limited activity within patient's tolerance;Ice applied;Premedicated before session(0/10 rest; 4/10 post exercise; pt requested 30 mins for pain meds to help-so saw after 30 mins)    Home Living                      Prior Function  PT Goals (current goals can now be found in the care plan section) Progress towards PT goals: Progressing toward goals    Frequency    7X/week      PT Plan Current plan remains appropriate    Co-evaluation              AM-PAC PT "6 Clicks" Mobility   Outcome Measure  Help needed turning from your back to your side while in a flat bed without using bedrails?: A Little Help needed moving from lying on your back to  sitting on the side of a flat bed without using bedrails?: A Little Help needed moving to and from a bed to a chair (including a wheelchair)?: A Little Help needed standing up from a chair using your arms (e.g., wheelchair or bedside chair)?: A Little Help needed to walk in hospital room?: A Little Help needed climbing 3-5 steps with a railing? : A Lot 6 Click Score: 17    End of Session Equipment Utilized During Treatment: Gait belt Activity Tolerance: Patient tolerated treatment well Patient left: in chair;with family/visitor present;with chair alarm set;with call bell/phone within reach Nurse Communication: Mobility status PT Visit Diagnosis: Muscle weakness (generalized) (M62.81);Difficulty in walking, not elsewhere classified (R26.2)     Time: 1135-1200 PT Time Calculation (min) (ACUTE ONLY): 25 min  Charges:  $Gait Training: 8-22 mins $Therapeutic Exercise: 8-22 mins                     Maggie Font, PT Acute Rehab Services [phone]    [name] 12/01/2018, 1:06 PM

## 2018-12-01 NOTE — Progress Notes (Signed)
Subjective: 1 Day Post-Op Procedure(s) (LRB): TOTAL KNEE ARTHROPLASTY (Left) Patient reports pain as moderate.   Patient seen in rounds by Dr. Wynelle Link. Patient is well, and has had no acute complaints or problems other than discomfort in the left knee. No acute events overnight. Ambulated 80 feet with PT yesterday. Foley catheter removed, positive flatus. Denies CP, SHOB.  We will continue therapy today.   Objective: Vital signs in last 24 hours: Temp:  [97.6 F (36.4 C)-98.4 F (36.9 C)] 98.1 F (36.7 C) (11/10 0527) Pulse Rate:  [56-81] 61 (11/10 0527) Resp:  [12-18] 16 (11/10 0527) BP: (94-157)/(55-89) 140/77 (11/10 0527) SpO2:  [95 %-100 %] 98 % (11/10 0527)  Intake/Output from previous day:  Intake/Output Summary (Last 24 hours) at 12/01/2018 0741 Last data filed at 12/01/2018 0527 Gross per 24 hour  Intake 3794.7 ml  Output 4500 ml  Net -705.3 ml     Intake/Output this shift: No intake/output data recorded.  Labs: Recent Labs    12/01/18 0231  HGB 10.5*   Recent Labs    12/01/18 0231  WBC 10.5  RBC 3.54*  HCT 33.3*  PLT 152   Recent Labs    12/01/18 0231  NA 135  K 4.2  CL 102  CO2 26  BUN 10  CREATININE 0.69  GLUCOSE 144*  CALCIUM 9.7   No results for input(s): LABPT, INR in the last 72 hours.  Exam: General - Patient is Alert and Oriented Extremity - Neurologically intact Sensation intact distally Intact pulses distally Dorsiflexion/Plantar flexion intact Dressing - dressing C/D/I Motor Function - intact, moving foot and toes well on exam.   Past Medical History:  Diagnosis Date  . Anemia   . Arthritis    osteoarthritis - shoulder, knees & hips  . Asthma   . Coronary artery disease   . Depression   . Diabetes mellitus without complication (HCC)    no meds  . Hypertension    Dr. Orinda Kenner manages BP, pt. reports MD has not found a need for treatment   . Hypothyroidism   . Sleep difficulties    had sleep study -2009, prior  to gastric surgery, told that there was not a need for f/u  . Thyroid disease   . Vitamin B 12 deficiency     Assessment/Plan: 1 Day Post-Op Procedure(s) (LRB): TOTAL KNEE ARTHROPLASTY (Left) Principal Problem:   OA (osteoarthritis) of knee  Estimated body mass index is 37.85 kg/m as calculated from the following:   Height as of this encounter: 5\' 1"  (1.549 m).   Weight as of this encounter: 90.9 kg. Advance diet Up with therapy D/C IV fluids  Anticipated LOS equal to or greater than 2 midnights due to - Age 78 and older with one or more of the following:  - Obesity  - Expected need for hospital services (PT, OT, Nursing) required for safe  discharge  - Anticipated need for postoperative skilled nursing care or inpatient rehab  - Active co-morbidities: Anemia OR   - Unanticipated findings during/Post Surgery: None  - Patient is a high risk of re-admission due to: None    DVT Prophylaxis - Aspirin Weight bearing as tolerated. D/C O2 and pulse ox and try on room air. Hemovac pulled without difficulty, will begin therapy today.  Plan is to go Home after hospital stay. Plan for discharge today following 1-2 sessions of therapy as long as she is meeting goals with therapy. We may consider altering her discharge pain medication  if we have difficulty controlling pain today. Scheduled for OPPT. Follow up in the office in 2 weeks.   Griffith Citron, PA-C Orthopedic Surgery [phone] 12/01/2018, 7:41 AM

## 2018-12-01 NOTE — Progress Notes (Signed)
[name], [place] was paged regarding the pt's temp of 100.5. Pt also requesting pain meds to be increased. Will CTM.

## 2018-12-01 NOTE — Progress Notes (Signed)
Therapy Plan: OPPT Has DME  

## 2018-12-01 NOTE — Progress Notes (Addendum)
Physical Therapy Treatment Patient Details Name: [name] MRN: [id number] DOB: 1940-11-15 Today's Date: 12/01/2018    History of Present Illness Patient is 78 y.o. female s/p Lt TKA on 11/30/18 with PMH significant for HTN, DM, CAD, OA, hypothyroidism.    PT Comments    Pt reports feeling much better this afternoon.  Pain is well controlled and pt able to increase activity.  Improved ability to perform AROM.  Pt demonstrates safe gait & transfers in order to return home from PT perspective once discharged by MD.  While in hospital, will continue to benefit from PT for skilled therapy to advance mobility and exercises.      Follow Up Recommendations  Follow surgeon's recommendation for DC plan and follow-up therapies     Equipment Recommendations  None recommended by PT    Recommendations for Other Services       Precautions / Restrictions Precautions Precautions: Fall Restrictions Weight Bearing Restrictions: No LLE Weight Bearing: Weight bearing as tolerated Other Position/Activity Restrictions: able to SLR-knee immobilizer not used    Mobility  Bed Mobility Overal bed mobility: Needs Assistance Bed Mobility: Supine to Sit     Supine to sit: Supervision     General bed mobility comments: cues for technique  Transfers Overall transfer level: Needs assistance Equipment used: Rolling walker (2 wheeled) Transfers: Sit to/from Stand Sit to Stand: Supervision         General transfer comment: cues for safe hand placement  Ambulation/Gait Ambulation/Gait assistance: Min guard;Supervision Gait Distance (Feet): 200 Feet Assistive device: Rolling walker (2 wheeled) Gait Pattern/deviations: Step-to pattern;Decreased stride length;Decreased weight shift to left;Decreased stance time - left;Decreased step length - left     General Gait Details: min guard progressed to Supervision; step to left gait progressed to step through with cues   Stairs Stairs:  Yes Stairs assistance: Min guard Stair Management: One rail Right;Step to pattern Number of Stairs: 5 General stair comments: performed with cues for sequence; both hands on same rail   Wheelchair Mobility    Modified Rankin (Stroke Patients Only)       Balance Overall balance assessment: Needs assistance Sitting-balance support: Feet supported;No upper extremity supported Sitting balance-Leahy Scale: Good     Standing balance support: During functional activity;Bilateral upper extremity supported Standing balance-Leahy Scale: Good                              Cognition Arousal/Alertness: Awake/alert Behavior During Therapy: WFL for tasks assessed/performed Overall Cognitive Status: Within Functional Limits for tasks assessed                                 General Comments: States feeling much better, ready to do therapy and go home      Exercises Total Joint Exercises Ankle Circles/Pumps: AROM;15 reps;Both;Supine Quad Sets: AROM;Left;10 reps;Supine Towel Squeeze: AROM;Left;10 reps;Supine Short Arc Quad: Supine;Left;10 reps;AROM Heel Slides: AAROM;Supine;Left;10 reps Hip ABduction/ADduction: Supine;Left;10 reps;AROM Straight Leg Raises: Supine;Left;10 reps;AROM Knee Flexion: Seated;10 reps;Left;AROM Goniometric ROM: Lknee lacking 5 ext; 80 flex    General Comments        Pertinent Vitals/Pain Pain Assessment: 0-10 Pain Score: 4  Pain Location: Lt knee Pain Descriptors / Indicators: Aching;Sore Pain Intervention(s): Limited activity within patient's tolerance;Ice applied    Home Living  Prior Function            PT Goals (current goals can now be found in the care plan section) Progress towards PT goals: Progressing toward goals    Frequency    7X/week      PT Plan Current plan remains appropriate    Co-evaluation              AM-PAC PT "6 Clicks" Mobility   Outcome Measure   Help needed turning from your back to your side while in a flat bed without using bedrails?: None Help needed moving from lying on your back to sitting on the side of a flat bed without using bedrails?: None Help needed moving to and from a bed to a chair (including a wheelchair)?: None Help needed standing up from a chair using your arms (e.g., wheelchair or bedside chair)?: None Help needed to walk in hospital room?: None Help needed climbing 3-5 steps with a railing? : None 6 Click Score: 24    End of Session Equipment Utilized During Treatment: Gait belt Activity Tolerance: Patient tolerated treatment well Patient left: in bed;with call bell/phone within reach;with bed alarm set Nurse Communication: Mobility status PT Visit Diagnosis: Muscle weakness (generalized) (M62.81);Difficulty in walking, not elsewhere classified (R26.2)     Time: [id number] PT Time Calculation (min) (ACUTE ONLY): 27 min  Charges:  $Gait Training: 8-22 mins $Therapeutic Exercise: 8-22 mins                     Maggie Font, PT Acute Rehab Services Pager [phone] [facility] [place]    Karlton Lemon 12/01/2018, 3:54 PM

## 2018-12-03 DIAGNOSIS — M25662 Stiffness of left knee, not elsewhere classified: Secondary | ICD-10-CM | POA: Diagnosis not present

## 2018-12-04 ENCOUNTER — Telehealth: Payer: Self-pay | Admitting: Pharmacist

## 2018-12-04 DIAGNOSIS — I25118 Atherosclerotic heart disease of native coronary artery with other forms of angina pectoris: Secondary | ICD-10-CM

## 2018-12-04 NOTE — Telephone Encounter (Signed)
Pt called clinic to schedule annual lipid panel on Repatha, scheduled for 12/21 since pt just had surgery and can't drive for 6 weeks.

## 2018-12-07 DIAGNOSIS — M25662 Stiffness of left knee, not elsewhere classified: Secondary | ICD-10-CM | POA: Diagnosis not present

## 2018-12-09 DIAGNOSIS — M25662 Stiffness of left knee, not elsewhere classified: Secondary | ICD-10-CM | POA: Diagnosis not present

## 2018-12-14 DIAGNOSIS — M25662 Stiffness of left knee, not elsewhere classified: Secondary | ICD-10-CM | POA: Diagnosis not present

## 2018-12-19 ENCOUNTER — Encounter: Payer: Self-pay | Admitting: Family Medicine

## 2018-12-21 DIAGNOSIS — M25662 Stiffness of left knee, not elsewhere classified: Secondary | ICD-10-CM | POA: Diagnosis not present

## 2018-12-23 DIAGNOSIS — H52203 Unspecified astigmatism, bilateral: Secondary | ICD-10-CM | POA: Diagnosis not present

## 2018-12-23 DIAGNOSIS — Z961 Presence of intraocular lens: Secondary | ICD-10-CM | POA: Diagnosis not present

## 2018-12-23 DIAGNOSIS — M25662 Stiffness of left knee, not elsewhere classified: Secondary | ICD-10-CM | POA: Diagnosis not present

## 2018-12-23 DIAGNOSIS — E119 Type 2 diabetes mellitus without complications: Secondary | ICD-10-CM | POA: Diagnosis not present

## 2018-12-23 LAB — HM DIABETES EYE EXAM

## 2018-12-25 DIAGNOSIS — M25662 Stiffness of left knee, not elsewhere classified: Secondary | ICD-10-CM | POA: Diagnosis not present

## 2018-12-28 ENCOUNTER — Other Ambulatory Visit: Payer: Medicare HMO

## 2018-12-30 DIAGNOSIS — M25662 Stiffness of left knee, not elsewhere classified: Secondary | ICD-10-CM | POA: Diagnosis not present

## 2019-01-01 DIAGNOSIS — M25662 Stiffness of left knee, not elsewhere classified: Secondary | ICD-10-CM | POA: Diagnosis not present

## 2019-01-01 DIAGNOSIS — E063 Autoimmune thyroiditis: Secondary | ICD-10-CM | POA: Diagnosis not present

## 2019-01-01 DIAGNOSIS — E038 Other specified hypothyroidism: Secondary | ICD-10-CM | POA: Diagnosis not present

## 2019-01-01 DIAGNOSIS — E210 Primary hyperparathyroidism: Secondary | ICD-10-CM | POA: Diagnosis not present

## 2019-01-01 LAB — BASIC METABOLIC PANEL
BUN: 13 (ref 4–21)
CO2: 28 — AB (ref 13–22)
Chloride: 101 (ref 99–108)
Creatinine: 0.8 (ref 0.5–1.1)
Glucose: 110
Potassium: 4.3 (ref 3.4–5.3)
Sodium: 135 — AB (ref 137–147)

## 2019-01-01 LAB — HEPATIC FUNCTION PANEL
ALT: 12 (ref 7–35)
AST: 19 (ref 13–35)
Alkaline Phosphatase: 161 — AB (ref 25–125)

## 2019-01-01 LAB — TSH: TSH: 3.02 (ref 0.41–5.90)

## 2019-01-01 LAB — COMPREHENSIVE METABOLIC PANEL
Albumin: 3.9 (ref 3.5–5.0)
Calcium: 10.5 (ref 8.7–10.7)

## 2019-01-05 DIAGNOSIS — Z471 Aftercare following joint replacement surgery: Secondary | ICD-10-CM | POA: Diagnosis not present

## 2019-01-05 DIAGNOSIS — Z96652 Presence of left artificial knee joint: Secondary | ICD-10-CM | POA: Diagnosis not present

## 2019-01-08 DIAGNOSIS — M25662 Stiffness of left knee, not elsewhere classified: Secondary | ICD-10-CM | POA: Diagnosis not present

## 2019-01-11 ENCOUNTER — Other Ambulatory Visit: Payer: Medicare HMO | Admitting: *Deleted

## 2019-01-11 ENCOUNTER — Other Ambulatory Visit: Payer: Self-pay

## 2019-01-11 DIAGNOSIS — I25118 Atherosclerotic heart disease of native coronary artery with other forms of angina pectoris: Secondary | ICD-10-CM | POA: Diagnosis not present

## 2019-01-12 DIAGNOSIS — E210 Primary hyperparathyroidism: Secondary | ICD-10-CM | POA: Diagnosis not present

## 2019-01-12 DIAGNOSIS — M8580 Other specified disorders of bone density and structure, unspecified site: Secondary | ICD-10-CM | POA: Diagnosis not present

## 2019-01-12 DIAGNOSIS — E063 Autoimmune thyroiditis: Secondary | ICD-10-CM | POA: Diagnosis not present

## 2019-01-12 DIAGNOSIS — E038 Other specified hypothyroidism: Secondary | ICD-10-CM | POA: Diagnosis not present

## 2019-01-12 DIAGNOSIS — E559 Vitamin D deficiency, unspecified: Secondary | ICD-10-CM | POA: Diagnosis not present

## 2019-01-12 LAB — LIPID PANEL
Chol/HDL Ratio: 2.2 ratio (ref 0.0–4.4)
Cholesterol, Total: 114 mg/dL (ref 100–199)
HDL: 53 mg/dL (ref >39)
LDL Chol Calc (NIH): 34 mg/dL (ref 0–99)
Triglycerides: 162 mg/dL — ABNORMAL HIGH (ref 0–149)
VLDL Cholesterol Cal: 27 mg/dL (ref 5–40)

## 2019-01-22 ENCOUNTER — Other Ambulatory Visit: Payer: Self-pay | Admitting: Family Medicine

## 2019-01-27 ENCOUNTER — Other Ambulatory Visit: Payer: Self-pay | Admitting: Pharmacist

## 2019-01-27 MED ORDER — REPATHA SURECLICK 140 MG/ML ~~LOC~~ SOAJ: 1.0000 "pen " | SUBCUTANEOUS | 3 refills | Status: DC

## 2019-01-29 ENCOUNTER — Other Ambulatory Visit: Payer: Self-pay | Admitting: Family Medicine

## 2019-02-01 ENCOUNTER — Telehealth: Payer: Self-pay | Admitting: Pharmacist

## 2019-02-01 ENCOUNTER — Other Ambulatory Visit: Payer: Self-pay | Admitting: Family Medicine

## 2019-02-01 ENCOUNTER — Telehealth: Payer: Self-pay

## 2019-02-01 NOTE — Telephone Encounter (Signed)
-----   Message from [place], [facility] sent at 01/29/2019  2:25 PM EST ----- Regarding: Repatha PA Looking at the Excel document looks like Repatha PA is good until April/2021 ,but patient is calling for new PA  [facility]) [phone] Ref. # [id number].   Please follow, Thanks

## 2019-02-01 NOTE — Telephone Encounter (Signed)
Medication Samples have been provided to the patient.  Drug name: Repatha       Strength: 140mg         Qty: 1  [id number] Exp.Date: 01/2021

## 2019-02-01 NOTE — Telephone Encounter (Signed)
Called and let the pt know that the pa is good lmomed the pt

## 2019-02-02 NOTE — Telephone Encounter (Signed)
Pharmacy request rx refill:  Diclofenac Sodium DR 75MG  Tablet Delayed  Okay to send?

## 2019-03-01 ENCOUNTER — Ambulatory Visit (INDEPENDENT_AMBULATORY_CARE_PROVIDER_SITE_OTHER): Payer: Medicare HMO

## 2019-03-01 ENCOUNTER — Ambulatory Visit (INDEPENDENT_AMBULATORY_CARE_PROVIDER_SITE_OTHER): Payer: Medicare HMO | Admitting: Family Medicine

## 2019-03-01 ENCOUNTER — Telehealth: Payer: Self-pay | Admitting: Family Medicine

## 2019-03-01 ENCOUNTER — Other Ambulatory Visit: Payer: Self-pay

## 2019-03-01 ENCOUNTER — Encounter: Payer: Self-pay | Admitting: Family Medicine

## 2019-03-01 VITALS — BP 150/72 | HR 67 | Temp 97.8°F | Ht 61.0 in | Wt 205.6 lb

## 2019-03-01 DIAGNOSIS — R0781 Pleurodynia: Secondary | ICD-10-CM

## 2019-03-01 DIAGNOSIS — E1169 Type 2 diabetes mellitus with other specified complication: Secondary | ICD-10-CM

## 2019-03-01 DIAGNOSIS — E785 Hyperlipidemia, unspecified: Secondary | ICD-10-CM

## 2019-03-01 DIAGNOSIS — E1159 Type 2 diabetes mellitus with other circulatory complications: Secondary | ICD-10-CM

## 2019-03-01 DIAGNOSIS — W19XXXA Unspecified fall, initial encounter: Secondary | ICD-10-CM | POA: Diagnosis not present

## 2019-03-01 DIAGNOSIS — S299XXA Unspecified injury of thorax, initial encounter: Secondary | ICD-10-CM | POA: Diagnosis not present

## 2019-03-01 DIAGNOSIS — I10 Essential (primary) hypertension: Secondary | ICD-10-CM | POA: Diagnosis not present

## 2019-03-01 NOTE — Telephone Encounter (Signed)
Noted  

## 2019-03-01 NOTE — Progress Notes (Addendum)
Phone [phone] In person visit   Subjective:   [name] is a 79 y.o. year old very pleasant female patient who presents for/with See problem oriented charting Chief Complaint  Patient presents with  . Fall    4 days ago     This visit occurred during the SARS-CoV-2 public health emergency.  Safety protocols were in place, including screening questions prior to the visit, additional usage of staff PPE, and extensive cleaning of exam room while observing appropriate contact time as indicated for disinfecting solutions.   Past Medical History-  Patient Active Problem List   Diagnosis Date Noted  . Polymyalgia rheumatica ([place]) 03/16/2018    Priority: High  . CAD (coronary artery disease) 11/19/2017    Priority: High  . Hypercalcemia 12/29/2014    Priority: High  . OA (osteoarthritis) of knee 10/05/2013    Priority: High  . Well controlled type 2 diabetes mellitus ([place]) 07/18/2006    Priority: High  . Overactive bladder 09/03/2016    Priority: Medium  . Morbid obesity ([place]) 06/28/2015    Priority: Medium  . History of gastric bypass 02/08/2014    Priority: Medium  . Hyperlipidemia associated with type 2 diabetes mellitus ([place]) 01/05/2008    Priority: Medium  . Hypothyroidism 09/09/2007    Priority: Medium  . Major depression, recurrent, full remission ([place]) 07/18/2006    Priority: Medium  . Hypertension associated with diabetes ([place]) 07/18/2006    Priority: Medium  . ASTHMA 07/18/2006    Priority: Medium  . Allergic rhinitis 07/20/2014    Priority: Low  . CHEST WALL PAIN, ANTERIOR 10/09/2007    Priority: Low  . Coronary artery disease of native artery of native heart with stable angina pectoris ([place]) 08/31/2018  . Chest pain with high risk for cardiac etiology 04/02/2018  . Hypercholesteremia   . Anxiety 07/17/2017  . Osteopenia of left hip 07/17/2017    Medications- reviewed and updated Current Outpatient Medications  Medication Sig Dispense Refill   . acetaminophen (TYLENOL) 500 MG tablet Take 1,000 mg by mouth every 8 (eight) hours as needed for mild pain or headache.    . albuterol (PROVENTIL HFA;VENTOLIN HFA) 108 (90 Base) MCG/ACT inhaler Inhale 1-2 puffs into the lungs every 4 (four) hours as needed for wheezing or shortness of breath. 1 Inhaler 6  . amLODipine (NORVASC) 10 MG tablet TAKE 1 TABLET EVERY DAY 90 tablet 0  . atorvastatin (LIPITOR) 20 MG tablet Take 20 mg by mouth every Friday.     . Blood Glucose Monitoring Suppl (TRUE METRIX AIR GLUCOSE METER) w/Device KIT USE AS DIRECTED 1 kit 0  . Cholecalciferol (VITAMIN D-3) 125 MCG (5000 UT) TABS Take 5,000 Units by mouth daily.    . cyanocobalamin (,VITAMIN B-12,) 1000 MCG/ML injection Inject 1 mL (1,000 mcg total) into the muscle every 30 (thirty) days. Uses on amonthly basis 10 mL 0  . desvenlafaxine (PRISTIQ) 100 MG 24 hr tablet TAKE 1 TABLET EVERY DAY 90 tablet 0  . diclofenac (VOLTAREN) 75 MG EC tablet TAKE 1 TABLET TWICE DAILY 180 tablet 1  . Evolocumab (REPATHA SURECLICK) 841 MG/ML SOAJ Inject 1 pen into the skin every 14 (fourteen) days. 6 pen 3  . FeFum-FePo-FA-B Cmp-C-Zn-Mn-Cu (SE-TAN PLUS) 162-115.2-1 MG CAPS Take 1 capsule by mouth twice daily (Patient taking differently: Take 1 capsule by mouth 2 (two) times daily. ) 180 capsule 0  . fluticasone (FLONASE) 50 MCG/ACT nasal spray USE 1 SPRAY IN EACH NOSTRIL EVERY DAY 32 g 0  .  glucose blood test strip Test blood sugar once per day 100 each 3  . Levothyroxine Sodium (TIROSINT) 150 MCG CAPS Take 300 mcg by mouth daily before breakfast.     . loperamide (IMODIUM) 2 MG capsule Take 4 mg by mouth as needed for diarrhea or loose stools.    . montelukast (SINGULAIR) 10 MG tablet TAKE 1 TABLET EVERY DAY WITH BREAKFAST (Patient taking differently: Take 10 mg by mouth daily with breakfast. ) 90 tablet 3  . nitroGLYCERIN (NITROSTAT) 0.4 MG SL tablet Place 1 tablet (0.4 mg total) under the tongue every 5 (five) minutes as needed for  chest pain. 25 tablet 6  . Omega-3 Fatty Acids (FISH OIL) 1000 MG CAPS Take 1,000 mg by mouth daily.    Marland Kitchen Propylene Glycol (SYSTANE COMPLETE OP) Apply 1 drop to eye 2 (two) times daily as needed (dryness).     . TRUEplus Lancets 30G MISC Check blood sugar once per day 100 each 3   No current facility-administered medications for this visit.     Objective:  BP (!) 150/72   Pulse 67   Temp 97.8 F (36.6 C) (Temporal)   Ht 5' 1"  (1.549 m)   Wt 205 lb 9.6 oz (93.3 kg)   SpO2 96%   BMI 38.85 kg/m  Gen: NAD, resting comfortably CV: RRR no murmurs rubs or gallops Lungs: CTAB no crackles, wheeze, rhonchi Minimal pain along left chest wall with palpation unless she lifts her left arm-abduction at the shoulder-at which point she complains of moderate to severe pain. Ext: no edema Skin: warm, dry    Assessment and Plan   # Fall/left chest wall pain S:Patient called in this morning saying she fell on Thursday and is now having arm and chest pain.Sent to triage" When she fell she had tripped over dog and is ow having chest pain when she moves her arm and has bruises on her elbow. Patient was advised to go to ED, but declined so we scheduled her for today." In office patient is having left arm pain when lifting and pain in chest on left side with movement. She has taken tylenol with little improvement. She has not tried ice or heat. She describes the pain as a stabbing pain with movement that can get to 8/10. She has same pain with taking deep breath.   She reports to me her dog stepped on the back of her slipper which caused her to fall forward onto her right knee and she rolled toward the left onto her elbow first and then onto her side.  Slightly hit her head but no headache or blurry vision.  Denies loss of consciousness A/P: 79 year old female with left chest wall pain with lifting her left arm-suspect muscular strain.  This started a few days after her fall-Saturday or so.  No pain unless  she lifts her left arm.  No exertional component-doubt cardiac.  She would like to rule out rib fracture with x-ray.  This was ordered today -Right knee is bruised but she declines x-ray.  Left elbow is not bruised. -We discussed fall prevention-she is going to encourage the dogs to go out in front of her instead of feeling behind her due to tripping hazard  #hyperlipidemia S: compliant with  atorvastatin 65m on fridays plus repatha. Still feels knocked down by atorvastatin dose for about a day but feels this is manageable Lab Results  Component Value Date   CHOL 114 01/11/2019   HDL 53 01/11/2019  LDLCALC 34 01/11/2019   LDLDIRECT 101.0 03/16/2018   TRIG 162 (H) 01/11/2019   CHOLHDL 2.2 01/11/2019   A/P: Much improved control-continue Repatha and atorvastatin 20 mg once weekly - consider stopping atorvastatin if worsening issues- she feels like currently tolerable but she feels more fatigued with these doses  #hypertension S: compliant with  amlodipine 10 mg other than missed last night- due to fall.  BP Readings from Last 3 Encounters:  03/01/19 (!) 150/72  12/01/18 (!) 159/86  11/25/18 (!) 144/79  A/P: Poor control today-last few numbers also slightly high.  I asked her to monitor at home when pain is more controlled and on days when she has amlodipine in her system-she agrees to let me know.  For now continue current medication amlodipine 10 mg-needs to make sure not to miss dose tonight  Recommended follow up: Recommended scheduling 35-monthfollow-up or sooner if needed Future Appointments  Date Time Provider DGardena 04/14/2019  8:00 AM SBelva Crome MD [facility]    Lab/Order associations:   ICD-10-CM   1. Rib pain on left side  R07.81 DG Ribs Unilateral Left  2. Hyperlipidemia associated with type 2 diabetes mellitus ([place]  E11.69    E78.5   3. Hypertension associated with diabetes ([place]  E11.59    I10   4. Fall, initial encounter  W19.XXXA     Return precautions advised.  SGarret Reddish MD

## 2019-03-01 NOTE — Telephone Encounter (Signed)
Patient called in this morning saying she fell on Thursday and is now having arm and chest pain.Sent to triage" When she fell she had tripped over dog and is ow having chest pain when she moves her arm and has bruises on her elbow. Patient was advised to go to ED, but declined so we scheduled her for today."

## 2019-03-01 NOTE — Patient Instructions (Addendum)
Health Maintenance Due  Topic Date Due  . URINE MICROALBUMIN - next visit 03/04/2019   Please let us know how blood pressure is doing on a day you have amlodipine in and when your pain is better  Please stop by x-ray before you go If you do not have mychart- we will call you about results within 5 business days of [place] receiving them.  If you have mychart- we will send your results within 3 business days of [place] receiving them.  If abnormal or we want to clarify a result, we will call or mychart you to make sure you receive the message.  If you have questions or concerns or don't hear within 5-7 days, please send [place] a message or call us.   Recommended follow up: Return in about 3 months (around 05/29/2019) for follow up- or sooner if needed.

## 2019-03-07 ENCOUNTER — Other Ambulatory Visit: Payer: Self-pay | Admitting: Family Medicine

## 2019-03-29 ENCOUNTER — Other Ambulatory Visit: Payer: Self-pay | Admitting: Family Medicine

## 2019-04-01 DIAGNOSIS — H1131 Conjunctival hemorrhage, right eye: Secondary | ICD-10-CM | POA: Diagnosis not present

## 2019-04-02 ENCOUNTER — Other Ambulatory Visit: Payer: Self-pay

## 2019-04-02 ENCOUNTER — Ambulatory Visit (INDEPENDENT_AMBULATORY_CARE_PROVIDER_SITE_OTHER): Payer: Medicare HMO

## 2019-04-02 DIAGNOSIS — Z0000 Encounter for general adult medical examination without abnormal findings: Secondary | ICD-10-CM

## 2019-04-02 NOTE — Patient Instructions (Signed)
[name] , Thank you for taking time to come for your Medicare Wellness Visit. I appreciate your ongoing commitment to your health goals. Please review the following plan we discussed and let me know if I can assist you in the future.   Screening recommendations/referrals: Colorectal Screening: up to date; last 10/19/14 (No longer indicated)  Mammogram: last 11/06/16; (No longer indicated) Bone Density: up to date; last 08/06/17 [name] until age 79)  Vision and Dental Exams: Recommended annual ophthalmology exams for early detection of glaucoma and other disorders of the eye Recommended annual dental exams for proper oral hygiene  Diabetic Exams: Diabetic Eye Exam: recommended yearly; up to date Diabetic Foot Exam: recommended yearly; up to date   Vaccinations: Influenza vaccine: completed 09/22/18 Pneumococcal vaccine: up to date; last 10/21/13 Tdap vaccine: up to date; last 03/01/13 Shingles vaccine: Shingrix completed   Advanced directives: Please bring a copy of your POA (Power of [place]) and/or Living Will to your next appointment.  Goals: Recommend to drink at least 6-8 8oz glasses of water per day and consume a balanced diet rich in fresh fruits and vegetables.   Next appointment: Please schedule your Annual Wellness Visit with your Nurse Health Advisor in one year.  Preventive Care 29 Years and Older, Female Preventive care refers to lifestyle choices and visits with your health care provider that can promote health and wellness. What does preventive care include?  A yearly physical exam. This is also called an annual well check.  Dental exams once or twice a year.  Routine eye exams. Ask your health care provider how often you should have your eyes checked.  Personal lifestyle choices, including:  Daily care of your teeth and gums.  Regular physical activity.  Eating a healthy diet.  Avoiding tobacco and drug use.  Limiting alcohol use.  Practicing safe  sex.  Taking low-dose aspirin every day if recommended by your health care provider.  Taking vitamin and mineral supplements as recommended by your health care provider. What happens during an annual well check? The services and screenings done by your health care provider during your annual well check will depend on your age, overall health, lifestyle risk factors, and family history of disease. Counseling  Your health care provider may ask you questions about your:  Alcohol use.  Tobacco use.  Drug use.  Emotional well-being.  Home and relationship well-being.  Sexual activity.  Eating habits.  History of falls.  Memory and ability to understand (cognition).  Work and work Statistician.  Reproductive health. Screening  You may have the following tests or measurements:  Height, weight, and BMI.  Blood pressure.  Lipid and cholesterol levels. These may be checked every 5 years, or more frequently if you are over 82 years old.  Skin check.  Lung cancer screening. You may have this screening every year starting at age 46 if you have a 30-pack-year history of smoking and currently smoke or have quit within the past 15 years.  Fecal occult blood test (FOBT) of the stool. You may have this test every year starting at age 101.  Flexible sigmoidoscopy or colonoscopy. You may have a sigmoidoscopy every 5 years or a colonoscopy every 10 years starting at age 34.  Hepatitis C blood test.  Hepatitis B blood test.  Sexually transmitted disease (STD) testing.  Diabetes screening. This is done by checking your blood sugar (glucose) after you have not eaten for a while (fasting). You may have this done every 1-3 years.  Bone density scan. This is done to screen for osteoporosis. You may have this done starting at age 57.  Mammogram. This may be done every 1-2 years. Talk to your health care provider about how often you should have regular mammograms. Talk with your health  care provider about your test results, treatment options, and if necessary, the need for more tests. Vaccines  Your health care provider may recommend certain vaccines, such as:  Influenza vaccine. This is recommended every year.  Tetanus, diphtheria, and acellular pertussis (Tdap, Td) vaccine. You may need a Td booster every 10 years.  Zoster vaccine. You may need this after age 97.  Pneumococcal 13-valent conjugate (PCV13) vaccine. One dose is recommended after age 33.  Pneumococcal polysaccharide (PPSV23) vaccine. One dose is recommended after age 55. Talk to your health care provider about which screenings and vaccines you need and how often you need them. This information is not intended to replace advice given to you by your health care provider. Make sure you discuss any questions you have with your health care provider. Document Released: 02/03/2015 Document Revised: 09/27/2015 Document Reviewed: 11/08/2014 Elsevier Interactive Patient Education  2017 [place] Prevention in the Home Falls can cause injuries. They can happen to people of all ages. There are many things you can do to make your home safe and to help prevent falls. What can I do on the outside of my home?  Regularly fix the edges of walkways and driveways and fix any cracks.  Remove anything that might make you trip as you walk through a door, such as a raised step or threshold.  Trim any bushes or trees on the path to your home.  Use bright outdoor lighting.  Clear any walking paths of anything that might make someone trip, such as rocks or tools.  Regularly check to see if handrails are loose or broken. Make sure that both sides of any steps have handrails.  Any raised decks and porches should have guardrails on the edges.  Have any leaves, snow, or ice cleared regularly.  Use sand or salt on walking paths during winter.  Clean up any spills in your garage right away. This includes oil or  grease spills. What can I do in the bathroom?  Use night lights.  Install grab bars by the toilet and in the tub and shower. Do not use towel bars as grab bars.  Use non-skid mats or decals in the tub or shower.  If you need to sit down in the shower, use a plastic, non-slip stool.  Keep the floor dry. Clean up any water that spills on the floor as soon as it happens.  Remove soap buildup in the tub or shower regularly.  Attach bath mats securely with double-sided non-slip rug tape.  Do not have throw rugs and other things on the floor that can make you trip. What can I do in the bedroom?  Use night lights.  Make sure that you have a light by your bed that is easy to reach.  Do not use any sheets or blankets that are too big for your bed. They should not hang down onto the floor.  Have a firm chair that has side arms. You can use this for support while you get dressed.  Do not have throw rugs and other things on the floor that can make you trip. What can I do in the kitchen?  Clean up any spills right away.  Avoid walking  on wet floors.  Keep items that you use a lot in easy-to-reach places.  If you need to reach something above you, use a strong step stool that has a grab bar.  Keep electrical cords out of the way.  Do not use floor polish or wax that makes floors slippery. If you must use wax, use non-skid floor wax.  Do not have throw rugs and other things on the floor that can make you trip. What can I do with my stairs?  Do not leave any items on the stairs.  Make sure that there are handrails on both sides of the stairs and use them. Fix handrails that are broken or loose. Make sure that handrails are as long as the stairways.  Check any carpeting to make sure that it is firmly attached to the stairs. Fix any carpet that is loose or worn.  Avoid having throw rugs at the top or bottom of the stairs. If you do have throw rugs, attach them to the floor with  carpet tape.  Make sure that you have a light switch at the top of the stairs and the bottom of the stairs. If you do not have them, ask someone to add them for you. What else can I do to help prevent falls?  Wear shoes that:  Do not have high heels.  Have rubber bottoms.  Are comfortable and fit you well.  Are closed at the toe. Do not wear sandals.  If you use a stepladder:  Make sure that it is fully opened. Do not climb a closed stepladder.  Make sure that both sides of the stepladder are locked into place.  Ask someone to hold it for you, if possible.  Clearly mark and make sure that you can see:  Any grab bars or handrails.  First and last steps.  Where the edge of each step is.  Use tools that help you move around (mobility aids) if they are needed. These include:  Canes.  Walkers.  Scooters.  Crutches.  Turn on the lights when you go into a dark area. Replace any light bulbs as soon as they burn out.  Set up your furniture so you have a clear path. Avoid moving your furniture around.  If any of your floors are uneven, fix them.  If there are any pets around you, be aware of where they are.  Review your medicines with your doctor. Some medicines can make you feel dizzy. This can increase your chance of falling. Ask your doctor what other things that you can do to help prevent falls. This information is not intended to replace advice given to you by your health care provider. Make sure you discuss any questions you have with your health care provider. Document Released: 11/03/2008 Document Revised: 06/15/2015 Document Reviewed: 02/11/2014 Elsevier Interactive Patient Education  2017 [employer].

## 2019-04-02 NOTE — Progress Notes (Signed)
This visit is being conducted via phone call due to the COVID-19 pandemic. This patient has given me verbal consent via phone to conduct this visit, patient states they are participating from their home address. Some vital signs may be absent or patient reported.   Patient identification: identified by name, DOB, and current address.  Location provider: [facility], Office Persons participating in the virtual visit: Denman George LPN and [facility] LPN, patient, and Dr. Garret Reddish   Subjective:   [name] is a 79 y.o. female who presents for Medicare Annual (Subsequent) preventive examination.  Review of Systems:   Cardiac Risk Factors include: advanced age (>92mn, >>57women);dyslipidemia;diabetes mellitus;hypertension    Objective:     Vitals: There were no vitals taken for this visit.  There is no height or weight on file to calculate BMI.  Advanced Directives 04/02/2019 11/30/2018 11/25/2018 04/02/2018 12/04/2017 05/08/2016 05/02/2016  Does Patient Have a Medical Advance Directive? _0  Yes Yes  Type of Advance Directive Living will;Healthcare Power of [place]Living will [place] will [place] will - [place] GardensLiving will -  Does patient want to make changes to medical advance directive? No - Patient declined No - Patient declined - No - Patient declined - No - Patient declined -  Copy of [place] Chart? No - copy requested No - copy requested - No - copy requested - No - copy requested -    Tobacco Social History   Tobacco Use  Smoking Status Former Smoker  . Packs/day: 0.70  . Years: 20.00  . Pack years: 14.00  . Quit date: 03/04/1978  . Years since quitting: 41.1  Smokeless Tobacco Never Used     Counseling given: Not Answered   Clinical Intake:  Pre-visit preparation completed: Yes  Pain : No/denies  pain  Diabetes: No  How often do you need to have someone help you when you read instructions, pamphlets, or other written materials from your doctor or pharmacy?: 1 - Never  Interpreter Needed?: No  Information entered by :: CDenman GeorgeLPN  Past Medical History:  Diagnosis Date  . Anemia   . Arthritis    osteoarthritis - shoulder, knees & hips  . Asthma   . Coronary artery disease   . Depression   . Diabetes mellitus without complication (HCC)    no meds  . Hypertension    Dr. JOrinda Kennermanages BP, pt. reports MD has not found a need for treatment   . Hypothyroidism   . Sleep difficulties    had sleep study -2009, prior to gastric surgery, told that there was not a need for f/u  . Thyroid disease   . Vitamin B 12 deficiency    Past Surgical History:  Procedure Laterality Date  . ABDOMINAL HYSTERECTOMY    . BARIATRIC SURGERY    . [facility]  . CATARACT EXTRACTION, BILATERAL     late 2018  . LEFT HEART CATH AND CORONARY ANGIOGRAPHY N/A 04/02/2018   Procedure: LEFT HEART CATH AND CORONARY ANGIOGRAPHY;  Surgeon: JMartinique Peter M, MD;  [facility];  Service: Cardiovascular;  Laterality: N/A;  . OOPHORECTOMY    . pantallor arthrodesis with rod placement left foot    . TONSILLECTOMY    . TOTAL KNEE ARTHROPLASTY Left 11/30/2018   Procedure: TOTAL KNEE ARTHROPLASTY;  Surgeon: AWynelle Link  Pilar Plate, MD;  Location: [facility];  Service: Orthopedics;  Laterality: Left;  . TOTAL SHOULDER ARTHROPLASTY Left 03/12/2012   Procedure: TOTAL SHOULDER ARTHROPLASTY;  Surgeon: Marin Shutter, MD;  Location: [facility];  Service: Orthopedics;  Laterality: Left;   Family History  Problem Relation Age of Onset  . Stroke Mother   . Liver cancer Father   . Cancer Father        liver  . Breast cancer Neg Hx    Social History   Socioeconomic History  . Marital status: Widowed    Spouse name: Not on file  . Number of children: Not on file  . Years of  education: Not on file  . Highest education level: Not on file  Occupational History  . Occupation: retired Optician, dispensing: RETIRED  Tobacco Use  . Smoking status: Former Smoker    Packs/day: 0.70    Years: 20.00    Pack years: 14.00    Quit date: 03/04/1978    Years since quitting: 41.1  . Smokeless tobacco: Never Used  Substance and Sexual Activity  . Alcohol use: Yes    Alcohol/week: 2.0 standard drinks    Types: 2 Glasses of wine per week    Comment: with dinner  or social rare -   . Drug use: No  . Sexual activity: Not Currently  Other Topics Concern  . Not on file  Social History Narrative   Widowed 2013. 2 kids. 4 grandkids. Oldest daughter lives in [place] and has 2 grandkids that are now in Scientist, physiological, PT school at [employer].       Retired-RN in operating room at [employer]: play cards with group, active with [employer] ([place])   Social Determinants of Health   Financial Resource Strain:   . Difficulty of Paying Living Expenses:   Food Insecurity:   . Worried About Charity fundraiser in the Last Year:   . Arboriculturist in the Last Year:   Transportation Needs:   . Film/video editor (Medical):   Marland Kitchen Lack of Transportation (Non-Medical):   Physical Activity:   . Days of Exercise per Week:   . Minutes of Exercise per Session:   Stress:   . Feeling of Stress :   Social Connections:   . Frequency of Communication with Friends and Family:   . Frequency of Social Gatherings with Friends and Family:   . Attends Religious Services:   . Active Member of Clubs or Organizations:   . Attends Archivist Meetings:   Marland Kitchen Marital Status:     Outpatient Encounter Medications as of 04/02/2019  Medication Sig  . acetaminophen (TYLENOL) 500 MG tablet Take 1,000 mg by mouth every 8 (eight) hours as needed for mild pain or headache.  . albuterol (PROVENTIL HFA;VENTOLIN HFA) 108 (90 Base) MCG/ACT inhaler Inhale 1-2 puffs  into the lungs every 4 (four) hours as needed for wheezing or shortness of breath.  Marland Kitchen amLODipine (NORVASC) 10 MG tablet TAKE 1 TABLET EVERY DAY  . atorvastatin (LIPITOR) 20 MG tablet TAKE 1 TABLET ONE TIME WEEKLY  . Blood Glucose Monitoring Suppl (TRUE METRIX AIR GLUCOSE METER) w/Device KIT USE AS DIRECTED  . Cholecalciferol (VITAMIN D-3) 125 MCG (5000 UT) TABS Take 5,000 Units by mouth daily.  . cyanocobalamin (,VITAMIN B-12,) 1000 MCG/ML injection Inject 1 mL (1,000 mcg total) into the muscle every 30 (thirty) days. Uses on amonthly  basis  . desvenlafaxine (PRISTIQ) 100 MG 24 hr tablet TAKE 1 TABLET EVERY DAY  . diclofenac (VOLTAREN) 75 MG EC tablet TAKE 1 TABLET TWICE DAILY  . Evolocumab (REPATHA SURECLICK) 283 MG/ML SOAJ Inject 1 pen into the skin every 14 (fourteen) days.  . FeFum-FePo-FA-B Cmp-C-Zn-Mn-Cu (SE-TAN PLUS) 162-115.2-1 MG CAPS Take 1 capsule by mouth twice daily  . fluticasone (FLONASE) 50 MCG/ACT nasal spray USE 1 SPRAY IN EACH NOSTRIL EVERY DAY  . glucose blood test strip Test blood sugar once per day  . Levothyroxine Sodium (TIROSINT) 150 MCG CAPS Take 300 mcg by mouth daily before breakfast.   . loperamide (IMODIUM) 2 MG capsule Take 4 mg by mouth as needed for diarrhea or loose stools.  . montelukast (SINGULAIR) 10 MG tablet TAKE 1 TABLET EVERY DAY WITH BREAKFAST (Patient taking differently: Take 10 mg by mouth daily with breakfast. )  . nitroGLYCERIN (NITROSTAT) 0.4 MG SL tablet Place 1 tablet (0.4 mg total) under the tongue every 5 (five) minutes as needed for chest pain.  . Omega-3 Fatty Acids (FISH OIL) 1000 MG CAPS Take 1,000 mg by mouth daily.  Marland Kitchen Propylene Glycol (SYSTANE COMPLETE OP) Apply 1 drop to eye 2 (two) times daily as needed (dryness).   . TRUEplus Lancets 30G MISC Check blood sugar once per day   No facility-administered encounter medications on file as of 04/02/2019.    Activities of Daily Living In your present state of health, do you have any difficulty  performing the following activities: 04/02/2019 11/30/2018  Hearing? N N  Vision? N N  Difficulty concentrating or making decisions? N N  Walking or climbing stairs? Y Y  Comment recent surgery -  Dressing or bathing? N N  Doing errands, shopping? N N  Preparing Food and eating ? N -  Using the Toilet? N -  In the past six months, have you accidently leaked urine? N -  Do you have problems with loss of bowel control? N -  Managing your Medications? N -  Managing your Finances? N -  Housekeeping or managing your Housekeeping? N -  Some recent data might be hidden    Patient Care Team: Marin Olp, MD as PCP - General (Family Medicine) Belva Crome, MD as PCP - Cardiology (Cardiology) Clarene Essex, MD as Consulting Physician (Gastroenterology) Altheimer, Legrand Como, MD as Referring Physician (Endocrinology) Luberta Mutter, MD as Consulting Physician (Ophthalmology) Gaynelle Arabian, MD as Consulting Physician (Orthopedic Surgery) Johnathan Hausen, MD as Consulting Physician (General Surgery) Raynelle Bring, MD as Consulting Physician (Urology) Rozetta Nunnery, MD as Consulting Physician (Otolaryngology)    Assessment:   This is a routine wellness examination for [place].  Exercise Activities and Dietary recommendations Current Exercise Habits: The patient does not participate in regular exercise at present  Goals    . Exercise 150 minutes per week (moderate activity)     Will explore tai chi or yoga       Fall Risk Fall Risk  04/02/2019 03/01/2019 03/01/2019 12/04/2017 09/04/2017  Falls in the past year? _0 0 Yes  Number falls in past yr: 0 0 0 - 2 or more  Injury with Fall? _1 - Yes  Risk Factor Category  - - - - High Fall Risk  Risk for fall due to : History of fall(s);Impaired mobility;Orthopedic patient History of fall(s);Impaired balance/gait - - Impaired balance/gait  Follow up Falls evaluation completed;Education provided;Falls prevention discussed  Education provided - - -   Is  the patient's home free of loose throw rugs in walkways, pet beds, electrical cords, etc?   yes      Grab bars in the bathroom? yes      Handrails on the stairs?   yes      Adequate lighting?   yes  Depression Screen PHQ 2/9 Scores 04/02/2019 03/01/2019 08/31/2018 03/03/2018  PHQ - 2 Score 0 0 1 2  PHQ- 9 Score - 0 6 5     Cognitive Function MMSE - Mini Mental State Exam 05/02/2016  Not completed: (No Data)     6CIT Screen 04/02/2019  What Year? 0 points  What month? 0 points  What time? 0 points  Count back from 20 0 points  Months in reverse 0 points  Repeat phrase 0 points  Total Score 0    Immunization History  Administered Date(s) Administered  . Fluad Quad(high Dose 65+) 09/22/2018  . Influenza Split 10/22/2010, 11/14/2011  . Influenza Whole 11/13/2006, 10/27/2008  . Influenza, High Dose Seasonal PF 10/28/2014, 09/28/2015, 10/18/2016, 11/19/2017  . Influenza,inj,Quad PF,6+ Mos 10/05/2012, 10/05/2013  . Influenza-Unspecified 10/21/2013  . PFIZER SARS-COV-2 Vaccination 03/01/2019  . Pneumococcal Conjugate-13 03/01/2013, 10/21/2013  . Pneumococcal Polysaccharide-23 05/31/2006  . Td 11/24/1998, 03/01/2013  . Zoster 06/20/2006  . Zoster Recombinat (Shingrix) 10/31/2016, 05/01/2017    Qualifies for Shingles Vaccine? Shingrix completed   Screening Tests Health Maintenance  Topic Date Due  . URINE MICROALBUMIN  03/04/2019  . FOOT EXAM  03/17/2019  . HEMOGLOBIN A1C  05/25/2019  . OPHTHALMOLOGY EXAM  12/23/2019  . TETANUS/TDAP  03/02/2023  . INFLUENZA VACCINE  Completed  . DEXA SCAN  Completed  . PNA vac Low Risk Adult  Completed    Cancer Screenings: Lung: Low Dose CT Chest recommended if Age 65-80 years, 30 pack-year currently smoking OR have quit w/in 15years. Patient does not qualify. Breast:  Up to date on Mammogram? Yes   Up to date of Bone Density/Dexa? Yes Colorectal: No longer indicated     Plan:  I have personally reviewed  and addressed the Medicare Annual Wellness questionnaire and have noted the following in the patient's chart:  A. Medical and social history B. Use of alcohol, tobacco or illicit drugs  C. Current medications and supplements D. Functional ability and status E.  Nutritional status F.  Physical activity G. Advance directives H. List of other physicians I.  Hospitalizations, surgeries, and ER visits in previous 12 months J.  [place] such as hearing and vision if needed, cognitive and depression L. Referrals, records requested, and appointments- none   In addition, I have reviewed and discussed with patient certain preventive protocols, quality metrics, and best practice recommendations. A written personalized care plan for preventive services as well as general preventive health recommendations were provided to patient.   Signed,  Denman George, LPN  Nurse Health Advisor   Nurse Notes: Patient has completed Covid vaccine Therapist, music)

## 2019-04-13 NOTE — Progress Notes (Signed)
Cardiology Office Note:    Date:  04/14/2019   ID:  [name], DOB April 14, 1940, MRN [id number]  PCP:  Marin Olp, MD  Cardiologist:  Sinclair Grooms, MD   Referring MD: Marin Olp, MD   Chief Complaint  Patient presents with  . Coronary Artery Disease  . Advice Only    Preop knee    History of Present Illness:    [name] is a 79 y.o. female with a hx of CAD, gastric bypass surgery, diet controlled DM, HTN, HLD, hypothyroidism and polymalgia rheumaticaseen for surgical clearance.   [name] is doing well.  She denies angina, dyspnea, orthopnea, lower extremity swelling, and medication side effects.  We transitioned to Repatha as she was unable to tolerate daily statin therapy.  She remains on atorvastatin 80 mg once per week.  Most recent LDL was 34.  I believe the atorvastatin can be discontinued.  She will have right knee replacement in June by Dr. Wynelle Link.  She had the right knee done in November and is doing well.  She is in the gym at rehab 3 times per week.  Relative to coronary disease no symptoms.  Cath none in mid 2020 demonstrated moderate disease but no action items.   Past Medical History:  Diagnosis Date  . Anemia   . Arthritis    osteoarthritis - shoulder, knees & hips  . Asthma   . Coronary artery disease   . Depression   . Diabetes mellitus without complication (HCC)    no meds  . Hypertension    Dr. Orinda Kenner manages BP, pt. reports MD has not found a need for treatment   . Hypothyroidism   . Sleep difficulties    had sleep study -2009, prior to gastric surgery, told that there was not a need for f/u  . Thyroid disease   . Vitamin B 12 deficiency     Past Surgical History:  Procedure Laterality Date  . ABDOMINAL HYSTERECTOMY    . BARIATRIC SURGERY    . CARDIAC CATHETERIZATION     [facility]- 30 yrs. ago  . CATARACT EXTRACTION, BILATERAL     late 2018  . LEFT HEART CATH AND CORONARY ANGIOGRAPHY N/A 04/02/2018    Procedure: LEFT HEART CATH AND CORONARY ANGIOGRAPHY;  Surgeon: Martinique, Peter M, MD;  Location: [facility];  Service: Cardiovascular;  Laterality: N/A;  . OOPHORECTOMY    . pantallor arthrodesis with rod placement left foot    . TONSILLECTOMY    . TOTAL KNEE ARTHROPLASTY Left 11/30/2018   Procedure: TOTAL KNEE ARTHROPLASTY;  Surgeon: Gaynelle Arabian, MD;  Location: [facility];  Service: Orthopedics;  Laterality: Left;  . TOTAL SHOULDER ARTHROPLASTY Left 03/12/2012   Procedure: TOTAL SHOULDER ARTHROPLASTY;  Surgeon: Marin Shutter, MD;  Location: [facility];  Service: Orthopedics;  Laterality: Left;    Current Medications: Current Meds  Medication Sig  . acetaminophen (TYLENOL) 500 MG tablet Take 1,000 mg by mouth every 8 (eight) hours as needed for mild pain or headache.  . albuterol (PROVENTIL HFA;VENTOLIN HFA) 108 (90 Base) MCG/ACT inhaler Inhale 1-2 puffs into the lungs every 4 (four) hours as needed for wheezing or shortness of breath.  Marland Kitchen amLODipine (NORVASC) 10 MG tablet TAKE 1 TABLET EVERY DAY  . Blood Glucose Monitoring Suppl (TRUE METRIX AIR GLUCOSE METER) w/Device KIT USE AS DIRECTED  . Cholecalciferol (VITAMIN D-3) 125 MCG (5000 UT) TABS Take 5,000 Units by mouth daily.  . cyanocobalamin (,  VITAMIN B-12,) 1000 MCG/ML injection Inject 1 mL (1,000 mcg total) into the muscle every 30 (thirty) days. Uses on amonthly basis  . desvenlafaxine (PRISTIQ) 100 MG 24 hr tablet TAKE 1 TABLET EVERY DAY  . diclofenac (VOLTAREN) 75 MG EC tablet TAKE 1 TABLET TWICE DAILY  . Evolocumab (REPATHA SURECLICK) 384 MG/ML SOAJ Inject 1 pen into the skin every 14 (fourteen) days.  . FeFum-FePo-FA-B Cmp-C-Zn-Mn-Cu (SE-TAN PLUS) 162-115.2-1 MG CAPS Take 1 capsule by mouth twice daily  . fluticasone (FLONASE) 50 MCG/ACT nasal spray USE 1 SPRAY IN EACH NOSTRIL EVERY DAY  . glucose blood test strip Test blood sugar once per day  . Levothyroxine Sodium (TIROSINT) 150 MCG CAPS Take 300 mcg by mouth daily before  breakfast.   . loperamide (IMODIUM) 2 MG capsule Take 4 mg by mouth as needed for diarrhea or loose stools.  . montelukast (SINGULAIR) 10 MG tablet TAKE 1 TABLET EVERY DAY WITH BREAKFAST  . nitroGLYCERIN (NITROSTAT) 0.4 MG SL tablet Place 1 tablet (0.4 mg total) under the tongue every 5 (five) minutes as needed for chest pain.  . Omega-3 Fatty Acids (FISH OIL) 1000 MG CAPS Take 1,000 mg by mouth daily.  Marland Kitchen Propylene Glycol (SYSTANE COMPLETE OP) Apply 1 drop to eye 2 (two) times daily as needed (dryness).   . TRUEplus Lancets 30G MISC Check blood sugar once per day  . [DISCONTINUED] atorvastatin (LIPITOR) 20 MG tablet TAKE 1 TABLET ONE TIME WEEKLY     Allergies:   Flexeril [cyclobenzaprine], Ace inhibitors, Azithromycin, and Codeine   Social History   Socioeconomic History  . Marital status: Widowed    Spouse name: Not on file  . Number of children: Not on file  . Years of education: Not on file  . Highest education level: Not on file  Occupational History  . Occupation: retired Optician, dispensing: RETIRED  Tobacco Use  . Smoking status: Former Smoker    Packs/day: 0.70    Years: 20.00    Pack years: 14.00    Quit date: 03/04/1978    Years since quitting: 41.1  . Smokeless tobacco: Never Used  Substance and Sexual Activity  . Alcohol use: Yes    Alcohol/week: 2.0 standard drinks    Types: 2 Glasses of wine per week    Comment: with dinner  or social rare -   . Drug use: No  . Sexual activity: Not Currently  Other Topics Concern  . Not on file  Social History Narrative   Widowed 2013. 2 kids. 4 grandkids. Oldest daughter lives in [place] and has 2 grandkids that are now in Scientist, physiological, PT school at [employer].       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: play cards with group, active with [employer] ([place])   Social Determinants of Health   Financial Resource Strain:   . Difficulty of Paying Living Expenses:   Food Insecurity:    . Worried About Charity fundraiser in the Last Year:   . Arboriculturist in the Last Year:   Transportation Needs:   . Film/video editor (Medical):   Marland Kitchen Lack of Transportation (Non-Medical):   Physical Activity:   . Days of Exercise per Week:   . Minutes of Exercise per Session:   Stress:   . Feeling of Stress :   Social Connections:   . Frequency of Communication with Friends and Family:   .  Frequency of Social Gatherings with Friends and Family:   . Attends Religious Services:   . Active Member of Clubs or Organizations:   . Attends Archivist Meetings:   Marland Kitchen Marital Status:      Family History: The patient's family history includes Cancer in her father; Liver cancer in her father; Stroke in her mother. There is no history of Breast cancer.  ROS:   Please see the history of present illness.    Having right knee arthritis.  Left knee has improved after replacement.  Physical activity is being achieved for both cardiac prevention and in preparation for knee surgery.  She sleeps well.  Statin therapy causes unacceptable muscle aches and pains.  She does use Voltaren.  All other systems reviewed and are negative.  EKGs/Labs/Other Studies Reviewed:    The following studies were reviewed today:   Cardiac Cath 2020: Diagnostic Dominance: Left    EKG:  EKG no new EKG tracing is performed.  In September 2020 demonstrated small Q waves and limb lead III, poor R wave progression, no acute ST-T wave change.  Normal sinus rhythm was noted.  Recent Labs: 12/01/2018: Hemoglobin 10.5; Platelets 152 01/01/2019: ALT 12; BUN 13; Creatinine 0.8; Potassium 4.3; Sodium 135; TSH 3.02  Recent Lipid Panel    Component Value Date/Time   CHOL 114 01/11/2019 1012   TRIG 162 (H) 01/11/2019 1012   HDL 53 01/11/2019 1012   CHOLHDL 2.2 01/11/2019 1012   CHOLHDL 2 04/07/2018 1109   VLDL 28.2 04/07/2018 1109   LDLCALC 34 01/11/2019 1012   LDLDIRECT 101.0 03/16/2018 0830     Physical Exam:    VS:  BP (!) 142/76   Pulse 71   Ht 5' 1"  (1.549 m)   Wt 206 lb 12.8 oz (93.8 kg)   SpO2 97%   BMI 39.07 kg/m     Wt Readings from Last 3 Encounters:  04/14/19 206 lb 12.8 oz (93.8 kg)  03/01/19 205 lb 9.6 oz (93.3 kg)  11/30/18 200 lb 4.8 oz (90.9 kg)     GEN: Morbidly obese. No acute distress HEENT: Normal NECK: No JVD. LYMPHATICS: No lymphadenopathy CARDIAC:  RRR without murmur, gallop, or edema. VASCULAR:  Normal Pulses. No bruits. RESPIRATORY:  Clear to auscultation without rales, wheezing or rhonchi  ABDOMEN: Soft, non-tender, non-distended, No pulsatile mass, MUSCULOSKELETAL: No deformity  SKIN: Warm and dry NEUROLOGIC:  Alert and oriented x 3 PSYCHIATRIC:  Normal affect   ASSESSMENT:    1. Coronary artery disease of native artery of native heart with stable angina pectoris ([place])   2. Essential hypertension   3. Hyperlipidemia, unspecified hyperlipidemia type   4. Preop cardiovascular exam   5. Morbid obesity ([place])   6. Well controlled type 2 diabetes mellitus ([place])   7. Educated about COVID-19 virus infection    PLAN:    In order of problems listed above:  1. Aggressive secondary prevention is reviewed.  I encouraged increase physical activity although she has hampered at this time by knee discomfort 2. Blood pressure target 130/80 mmHg.  Initial blood pressure was 191 mmHg systolic on repeat it was 132 mmHg.  Low-salt diet encouraged.  Minimize intake of Voltaren. 3. LDL cholesterol was 34 on a combination of Repatha and once per week atorvastatin.  I have asked her to discontinue atorvastatin therapy. 4. She is cleared for the upcoming knee replacement surgery involving the right side.  No further evaluation is necessary. 5. Still a significant problem.  Hopefully  after the second knee operation, she will be more active and be able to exercise more. 6. Consider adding low-dose ARB therapy has renal and cardioprotection given her  diagnosis of diet-controlled diabetes. 7. She has received COVID-19 vaccination.  Still practicing social distancing.  Overall education and awareness concerning primary/secondary risk prevention was discussed in detail: LDL less than 70, hemoglobin A1c less than 7, blood pressure target less than 130/80 mmHg, >150 minutes of moderate aerobic activity per week, avoidance of smoking, weight control (via diet and exercise), and continued surveillance/management of/for obstructive sleep apnea.  Plan clinical follow-up in 1 year.   Medication Adjustments/Labs and Tests Ordered: Current medicines are reviewed at length with the patient today.  Concerns regarding medicines are outlined above.  No orders of the defined types were placed in this encounter.  No orders of the defined types were placed in this encounter.   Patient Instructions  Medication Instructions:  1) DISCONTINUE Atorvastatin  *If you need a refill on your cardiac medications before your next appointment, please call your pharmacy*   Lab Work: None If you have labs (blood work) drawn today and your tests are completely normal, you will receive your results only by: Marland Kitchen MyChart Message (if you have MyChart) OR . A paper copy in the mail If you have any lab test that is abnormal or we need to change your treatment, we will call you to review the results.   Testing/Procedures: None   Follow-Up: At [facility], you and your health needs are our priority.  As part of our continuing mission to provide you with exceptional heart care, we have created designated Provider Care Teams.  These Care Teams include your primary Cardiologist (physician) and Advanced Practice Providers (APPs -  Physician Assistants and Nurse Practitioners) who all work together to provide you with the care you need, when you need it.  We recommend signing up for the patient portal called "MyChart".  Sign up information is provided on this After Visit  Summary.  MyChart is used to connect with patients for Virtual Visits (Telemedicine).  Patients are able to view lab/test results, encounter notes, upcoming appointments, etc.  Non-urgent messages can be sent to your provider as well.   To learn more about what you can do with MyChart, go to [email or web address].    Your next appointment:   12 month(s)  The format for your next appointment:   In Person  Provider:   You may see Sinclair Grooms, MD or one of the following Advanced Practice Providers on your designated Care Team:    Truitt Merle, NP  Cecilie Kicks, NP  Kathyrn Drown, NP    Other Instructions      Signed, Sinclair Grooms, MD  04/14/2019 8:32 AM    [place]

## 2019-04-14 ENCOUNTER — Encounter: Payer: Self-pay | Admitting: Interventional Cardiology

## 2019-04-14 ENCOUNTER — Ambulatory Visit: Payer: Medicare HMO | Admitting: Interventional Cardiology

## 2019-04-14 ENCOUNTER — Other Ambulatory Visit: Payer: Self-pay

## 2019-04-14 ENCOUNTER — Telehealth: Payer: Self-pay | Admitting: *Deleted

## 2019-04-14 VITALS — BP 142/76 | HR 71 | Ht 61.0 in | Wt 206.8 lb

## 2019-04-14 DIAGNOSIS — I10 Essential (primary) hypertension: Secondary | ICD-10-CM | POA: Diagnosis not present

## 2019-04-14 DIAGNOSIS — Z7189 Other specified counseling: Secondary | ICD-10-CM | POA: Diagnosis not present

## 2019-04-14 DIAGNOSIS — E119 Type 2 diabetes mellitus without complications: Secondary | ICD-10-CM

## 2019-04-14 DIAGNOSIS — I25118 Atherosclerotic heart disease of native coronary artery with other forms of angina pectoris: Secondary | ICD-10-CM | POA: Diagnosis not present

## 2019-04-14 DIAGNOSIS — E785 Hyperlipidemia, unspecified: Secondary | ICD-10-CM | POA: Diagnosis not present

## 2019-04-14 DIAGNOSIS — Z01810 Encounter for preprocedural cardiovascular examination: Secondary | ICD-10-CM | POA: Diagnosis not present

## 2019-04-14 NOTE — Telephone Encounter (Signed)
   [facility] Pre-operative Risk Assessment    Request for surgical clearance:  1. What type of surgery is being performed?  RIGHT TOAL KNEE ARTHROPLASTY    2. When is this surgery scheduled?  06/28/19   3. What type of clearance is required (medical clearance vs. Pharmacy clearance to hold med vs. Both)?  PHARMACY  4. Are there any medications that need to be held prior to surgery and how long? ASPIRIN   5. Practice name and name of physician performing surgery?  [facility] / DR. Maureen Ralphs   6. What is your office phone number [phone]    7.   What is your office fax number [phone] ATTN:  KELLY HANCOCK  8.   Anesthesia type (None, local, MAC, general) ?  CHOICE   Jeanann Lewandowsky 04/14/2019, 9:35 AM  _________________________________________________________________   (provider comments below)

## 2019-04-14 NOTE — Patient Instructions (Signed)
Medication Instructions:  1) DISCONTINUE Atorvastatin  *If you need a refill on your cardiac medications before your next appointment, please call your pharmacy*   Lab Work: None If you have labs (blood work) drawn today and your tests are completely normal, you will receive your results only by: Marland Kitchen MyChart Message (if you have MyChart) OR . A paper copy in the mail If you have any lab test that is abnormal or we need to change your treatment, we will call you to review the results.   Testing/Procedures: None   Follow-Up: At [facility], you and your health needs are our priority.  As part of our continuing mission to provide you with exceptional heart care, we have created designated Provider Care Teams.  These Care Teams include your primary Cardiologist (physician) and Advanced Practice Providers (APPs -  Physician Assistants and Nurse Practitioners) who all work together to provide you with the care you need, when you need it.  We recommend signing up for the patient portal called "MyChart".  Sign up information is provided on this After Visit Summary.  MyChart is used to connect with patients for Virtual Visits (Telemedicine).  Patients are able to view lab/test results, encounter notes, upcoming appointments, etc.  Non-urgent messages can be sent to your provider as well.   To learn more about what you can do with MyChart, go to [email or web address].    Your next appointment:   12 month(s)  The format for your next appointment:   In Person  Provider:   You may see Sinclair Grooms, MD or one of the following Advanced Practice Providers on your designated Care Team:    Truitt Merle, NP  Cecilie Kicks, NP  Kathyrn Drown, NP    Other Instructions

## 2019-04-14 NOTE — Telephone Encounter (Signed)
   Primary Cardiologist: Sinclair Grooms, MD  Chart reviewed as part of pre-operative protocol coverage. Given past medical history and time since last visit, based on ACC/AHA guidelines, RIZA SHILLINGS would be at acceptable risk for the planned procedure without further cardiovascular testing. Pt was seen in the office today by Dr. Tamala Julian and cleared for surgery, see office note.   Regarding ASA therapy, we recommend continuation of ASA throughout the perioperative period.  However, if the surgeon feels that cessation of ASA is required in the perioperative period, it may be stopped 5-7 days prior to surgery with a plan to resume it as soon as felt to be feasible from a surgical standpoint in the post-operative period.  I will route this recommendation to the requesting party via Epic fax function and remove from pre-op pool.  Please call with questions.  Daune Perch, NP 04/14/2019, 3:16 PM

## 2019-05-31 ENCOUNTER — Encounter: Payer: Self-pay | Admitting: Family Medicine

## 2019-05-31 ENCOUNTER — Other Ambulatory Visit: Payer: Self-pay

## 2019-05-31 ENCOUNTER — Ambulatory Visit (INDEPENDENT_AMBULATORY_CARE_PROVIDER_SITE_OTHER): Payer: Medicare HMO | Admitting: Family Medicine

## 2019-05-31 VITALS — BP 126/72 | HR 64 | Temp 98.2°F | Ht 61.0 in | Wt 205.4 lb

## 2019-05-31 DIAGNOSIS — E1169 Type 2 diabetes mellitus with other specified complication: Secondary | ICD-10-CM

## 2019-05-31 DIAGNOSIS — E538 Deficiency of other specified B group vitamins: Secondary | ICD-10-CM | POA: Diagnosis not present

## 2019-05-31 DIAGNOSIS — E785 Hyperlipidemia, unspecified: Secondary | ICD-10-CM | POA: Diagnosis not present

## 2019-05-31 DIAGNOSIS — Z8739 Personal history of other diseases of the musculoskeletal system and connective tissue: Secondary | ICD-10-CM

## 2019-05-31 DIAGNOSIS — I10 Essential (primary) hypertension: Secondary | ICD-10-CM | POA: Diagnosis not present

## 2019-05-31 DIAGNOSIS — F3342 Major depressive disorder, recurrent, in full remission: Secondary | ICD-10-CM

## 2019-05-31 DIAGNOSIS — E1159 Type 2 diabetes mellitus with other circulatory complications: Secondary | ICD-10-CM

## 2019-05-31 DIAGNOSIS — E119 Type 2 diabetes mellitus without complications: Secondary | ICD-10-CM | POA: Diagnosis not present

## 2019-05-31 DIAGNOSIS — I25118 Atherosclerotic heart disease of native coronary artery with other forms of angina pectoris: Secondary | ICD-10-CM

## 2019-05-31 LAB — MICROALBUMIN / CREATININE URINE RATIO
Creatinine,U: 19.9 mg/dL
Microalb Creat Ratio: 3.5 mg/g (ref 0.0–30.0)
Microalb, Ur: <0.7 mg/dL (ref 0.0–1.9)

## 2019-05-31 LAB — COMPREHENSIVE METABOLIC PANEL
ALT: 16 U/L (ref 0–35)
AST: 21 U/L (ref 0–37)
Albumin: 4.1 g/dL (ref 3.5–5.2)
Alkaline Phosphatase: 141 U/L — ABNORMAL HIGH (ref 39–117)
BUN: 16 mg/dL (ref 6–23)
CO2: 31 mEq/L (ref 19–32)
Calcium: 10.4 mg/dL (ref 8.4–10.5)
Chloride: 98 mEq/L (ref 96–112)
Creatinine, Ser: 0.86 mg/dL (ref 0.40–1.20)
GFR: 63.62 mL/min (ref >60.00)
Glucose, Bld: 102 mg/dL — ABNORMAL HIGH (ref 70–99)
Potassium: 4.7 mEq/L (ref 3.5–5.1)
Sodium: 134 mEq/L — ABNORMAL LOW (ref 135–145)
Total Bilirubin: 0.6 mg/dL (ref 0.2–1.2)
Total Protein: 6.7 g/dL (ref 6.0–8.3)

## 2019-05-31 LAB — CBC WITH DIFFERENTIAL/PLATELET
Basophils Absolute: 0.1 10*3/uL (ref 0.0–0.1)
Basophils Relative: 1 % (ref 0.0–3.0)
Eosinophils Absolute: 0.5 10*3/uL (ref 0.0–0.7)
Eosinophils Relative: 7.5 % — ABNORMAL HIGH (ref 0.0–5.0)
HCT: 41 % (ref 36.0–46.0)
Hemoglobin: 13.6 g/dL (ref 12.0–15.0)
Lymphocytes Relative: 24.2 % (ref 12.0–46.0)
Lymphs Abs: 1.6 10*3/uL (ref 0.7–4.0)
MCHC: 33.1 g/dL (ref 30.0–36.0)
MCV: 90.1 fl (ref 78.0–100.0)
Monocytes Absolute: 0.6 10*3/uL (ref 0.1–1.0)
Monocytes Relative: 9.9 % (ref 3.0–12.0)
Neutro Abs: 3.7 10*3/uL (ref 1.4–7.7)
Neutrophils Relative %: 57.4 % (ref 43.0–77.0)
Platelets: 210 10*3/uL (ref 150.0–400.0)
RBC: 4.54 Mil/uL (ref 3.87–5.11)
RDW: 14.8 % (ref 11.5–15.5)
WBC: 6.5 10*3/uL (ref 4.0–10.5)

## 2019-05-31 LAB — VITAMIN B12: Vitamin B-12: 1084 pg/mL — ABNORMAL HIGH (ref 211–911)

## 2019-05-31 LAB — HEMOGLOBIN A1C: Hgb A1c MFr Bld: 6 % (ref 4.6–6.5)

## 2019-05-31 LAB — LDL CHOLESTEROL, DIRECT: Direct LDL: 60 mg/dL

## 2019-05-31 NOTE — Assessment & Plan Note (Signed)
#  Polymyalgia rheumatica-will change to history of this- she states intermittently will note some similar symptoms but nothing persistent- will change diagnosis

## 2019-05-31 NOTE — Patient Instructions (Addendum)
Please stop by lab before you go If you have mychart- we will send your results within 3 business days of [place] receiving them.  If you do not have mychart- we will call you about results within 5 business days of [place] receiving them.   No changes today- best of luck with your surgery.   Recommended follow up: Return in about 4-6 months (around 9-11/10/2019) for physical or sooner if needed.

## 2019-05-31 NOTE — Progress Notes (Addendum)
Phone [phone] In person visit   Subjective:   [name] is a 79 y.o. year old very pleasant female patient who presents for/with See problem oriented charting Chief Complaint  Patient presents with  . Hypertension  . Hyperlipidemia   This visit occurred during the SARS-CoV-2 public health emergency.  Safety protocols were in place, including screening questions prior to the visit, additional usage of staff PPE, and extensive cleaning of exam room while observing appropriate contact time as indicated for disinfecting solutions.   Past Medical History-  Patient Active Problem List   Diagnosis Date Noted  . History of polymyalgia rheumatica 03/16/2018    Priority: High  . CAD (coronary artery disease) 11/19/2017    Priority: High  . Hypercalcemia 12/29/2014    Priority: High  . OA (osteoarthritis) of knee 10/05/2013    Priority: High  . Well controlled type 2 diabetes mellitus ([place]) 07/18/2006    Priority: High  . Overactive bladder 09/03/2016    Priority: Medium  . Morbid obesity ([place]) 06/28/2015    Priority: Medium  . History of gastric bypass 02/08/2014    Priority: Medium  . Hyperlipidemia associated with type 2 diabetes mellitus ([place]) 01/05/2008    Priority: Medium  . Hypothyroidism 09/09/2007    Priority: Medium  . Major depression, recurrent, full remission ([place]) 07/18/2006    Priority: Medium  . Hypertension associated with diabetes ([place]) 07/18/2006    Priority: Medium  . Asthma 07/18/2006    Priority: Medium  . Allergic rhinitis 07/20/2014    Priority: Low  . CHEST WALL PAIN, ANTERIOR 10/09/2007    Priority: Low  . Coronary artery disease of native artery of native heart with stable angina pectoris ([place]) 08/31/2018  . Chest pain with high risk for cardiac etiology 04/02/2018  . Hypercholesteremia   . Anxiety 07/17/2017  . Osteopenia of left hip 07/17/2017    Medications- reviewed and updated Current Outpatient Medications  Medication Sig  Dispense Refill  . acetaminophen (TYLENOL) 500 MG tablet Take 1,000 mg by mouth every 8 (eight) hours as needed for mild pain or headache.    . albuterol (PROVENTIL HFA;VENTOLIN HFA) 108 (90 Base) MCG/ACT inhaler Inhale 1-2 puffs into the lungs every 4 (four) hours as needed for wheezing or shortness of breath. 1 Inhaler 6  . amLODipine (NORVASC) 10 MG tablet TAKE 1 TABLET EVERY DAY 90 tablet 0  . aspirin EC 81 MG tablet Take 81 mg by mouth daily.    . Blood Glucose Monitoring Suppl (TRUE METRIX AIR GLUCOSE METER) w/Device KIT USE AS DIRECTED 1 kit 0  . Cholecalciferol (VITAMIN D-3) 125 MCG (5000 UT) TABS Take 5,000 Units by mouth daily.    . cyanocobalamin (,VITAMIN B-12,) 1000 MCG/ML injection Inject 1 mL (1,000 mcg total) into the muscle every 30 (thirty) days. Uses on amonthly basis 10 mL 0  . desvenlafaxine (PRISTIQ) 100 MG 24 hr tablet TAKE 1 TABLET EVERY DAY 90 tablet 0  . diclofenac (VOLTAREN) 75 MG EC tablet TAKE 1 TABLET TWICE DAILY 180 tablet 1  . Evolocumab (REPATHA SURECLICK) 850 MG/ML SOAJ Inject 1 pen into the skin every 14 (fourteen) days. 6 pen 3  . FeFum-FePo-FA-B Cmp-C-Zn-Mn-Cu (SE-TAN PLUS) 162-115.2-1 MG CAPS Take 1 capsule by mouth twice daily 180 capsule 0  . fluticasone (FLONASE) 50 MCG/ACT nasal spray USE 1 SPRAY IN EACH NOSTRIL EVERY DAY 32 g 0  . glucose blood test strip Test blood sugar once per day 100 each 3  . Levothyroxine Sodium (  TIROSINT) 150 MCG CAPS Take 300 mcg by mouth daily before breakfast.     . loperamide (IMODIUM) 2 MG capsule Take 4 mg by mouth as needed for diarrhea or loose stools.    . montelukast (SINGULAIR) 10 MG tablet TAKE 1 TABLET EVERY DAY WITH BREAKFAST 90 tablet 3  . nitroGLYCERIN (NITROSTAT) 0.4 MG SL tablet Place 1 tablet (0.4 mg total) under the tongue every 5 (five) minutes as needed for chest pain. 25 tablet 6  . Omega-3 Fatty Acids (FISH OIL) 1000 MG CAPS Take 1,000 mg by mouth daily.    Marland Kitchen Propylene Glycol (SYSTANE COMPLETE OP) Apply 1  drop to eye 2 (two) times daily as needed (dryness).     . TRUEplus Lancets 30G MISC Check blood sugar once per day 100 each 3   No current facility-administered medications for this visit.     Objective:  BP 126/72   Pulse 64   Temp 98.2 F (36.8 C) (Temporal)   Ht 5' 1"  (1.549 m)   Wt 205 lb 6.4 oz (93.2 kg)   SpO2 95%   BMI 38.81 kg/m  Gen: NAD, resting comfortably CV: RRR stable murmur Lungs: CTAB no crackles, wheeze, rhonchi Ext: no edema Skin: warm, dry Neuro: grossly normal, moves all extremities  Diabetic Foot Exam - Simple   Simple Foot Form Diabetic Foot exam was performed with the following findings: Yes 05/31/2019  1:48 PM  Visual Inspection No deformities, no ulcerations, no other skin breakdown bilaterally: Yes Sensation Testing Intact to touch and monofilament testing bilaterally: Yes Pulse Check Posterior Tibialis and Dorsalis pulse intact bilaterally: Yes Comments        Assessment and Plan   # June 2021 due for right knee replacement has not felt like she has fully healed from the left knee surgery. For nowVoltaren one a day, sparing hydrocodone, uses tylenol.   # left ear stopped up- sees Dr. Lucia Gaskins on Friday.   # Diabetes S: Medication: diet controlled  CBGs- does not check but does have meter Exercise and diet- weight stable at 205 from august Lab Results  Component Value Date   HGBA1C 6.1 (H) 11/25/2018   HGBA1C 6.0 03/03/2018   HGBA1C 6.6 (H) 11/19/2017   A/P:  Reasonable control in past- update a1c with labs   #hypertension S: medication: Amlodipine 10 mg Home readings #s: variable but overall controlled BP Readings from Last 3 Encounters:  05/31/19 126/72  04/14/19 (!) 142/76  03/01/19 (!) 150/72  A/P: excellent control today- continue current meds   #CAD-follows with cardiology-rare use of nitroglycerin- has not used recent  #hyperlipidemia S: Medication:  Repatha.  Also compliant with aspirin 46m  Lab Results  Component  Value Date   CHOL 114 01/11/2019   HDL 53 01/11/2019   LDLCALC 34 01/11/2019   LDLDIRECT 101.0 03/16/2018   TRIG 162 (H) 01/11/2019   CHOLHDL 2.2 01/11/2019   A/P: CAD asymptomatic. Hld hopefully controlled- updatelipid panel  # Depression S: Medication:Pristiq 100 mg [facility] 05/31/2019 04/02/2019 03/01/2019  Decreased Interest 0 0 0  Down, Depressed, Hopeless 0 0 0  PHQ - 2 Score 0 0 0  Altered sleeping 1 - 0  Tired, decreased energy 3 - 0  Change in appetite 0 - 0  Feeling bad or failure about yourself  0 - 0  Trouble concentrating 0 - 0  Moving slowly or fidgety/restless 0 - 0  Suicidal thoughts 0 - 0  PHQ-9 Score 4 - 0  Difficult  doing work/chores Not difficult at all - Not difficult at all  Some recent data might be hidden  A/P: reasonable control/full resmission- continue current meds   # B12 deficiency S: Current treatment/medication (oral vs. IM): IM injections once a month. Has not taken dose yet this month Lab Results  Component Value Date   VITAMINB12 1,285 (H) 11/19/2017  A/P: update b12 with labs- hopefully stable   #Polymyalgia rheumatica-will change to history of this- she states intermittently will note some similar symptoms but nothing persistent- will change diagnosis   #Hypothyroidism-follows with Dr. Elyse Hsu  # discussed [place] issues potential- Well controlled with control of allergies. Allergist in past- doesn't want to do shots. Could pull back on singulair if neededed if depression worsens. She may just trial off of when not in allergy season- takes allegra as well  Recommended follow up: Return in about 4-6 months (around 9-11/10/2019) for physical or sooner if needed. Future Appointments  Date Time Provider [place]  06/04/2019  1:30 PM Rozetta Nunnery, MD [facility] None    Lab/Order associations:   ICD-10-CM   1. Well controlled type 2 diabetes mellitus ([place])  E11.9 CBC with Differential/Platelet     Comprehensive metabolic panel    Hemoglobin A1c    Microalbumin / creatinine urine ratio  2. Coronary artery disease of native artery of native heart with stable angina pectoris ([place])  I25.118   3. Major depression, recurrent, full remission ([place])  F33.42   4. Hypertension associated with diabetes ([place])  E11.59    I10   5. Hyperlipidemia associated with type 2 diabetes mellitus (H[facility])  E11.69 LDL cholesterol, direct   E78.5   6. History of polymyalgia rheumatica  Z87.39   7. B12 deficiency  E53.8 Vitamin B12   Return precautions advised.  Garret Reddish, MD

## 2019-06-01 DIAGNOSIS — M1711 Unilateral primary osteoarthritis, right knee: Secondary | ICD-10-CM | POA: Diagnosis not present

## 2019-06-04 ENCOUNTER — Ambulatory Visit (INDEPENDENT_AMBULATORY_CARE_PROVIDER_SITE_OTHER): Payer: Medicare HMO | Admitting: Otolaryngology

## 2019-06-04 ENCOUNTER — Encounter (INDEPENDENT_AMBULATORY_CARE_PROVIDER_SITE_OTHER): Payer: Self-pay | Admitting: Otolaryngology

## 2019-06-04 ENCOUNTER — Other Ambulatory Visit: Payer: Self-pay

## 2019-06-04 VITALS — Temp 97.7°F

## 2019-06-04 DIAGNOSIS — H6123 Impacted cerumen, bilateral: Secondary | ICD-10-CM | POA: Diagnosis not present

## 2019-06-04 NOTE — Progress Notes (Signed)
HPI: [name] is a 79 y.o. female who presents for evaluation of wax buildup in ears left side worse than right.  She has had this cleaned previously..  Past Medical History:  Diagnosis Date  . Anemia   . Arthritis    osteoarthritis - shoulder, knees & hips  . Asthma   . Coronary artery disease   . Depression   . Diabetes mellitus without complication (HCC)    no meds  . Hypertension    Dr. Orinda Kenner manages BP, pt. reports MD has not found a need for treatment   . Hypothyroidism   . Sleep difficulties    had sleep study -2009, prior to gastric surgery, told that there was not a need for f/u  . Thyroid disease   . Vitamin B 12 deficiency    Past Surgical History:  Procedure Laterality Date  . ABDOMINAL HYSTERECTOMY    . BARIATRIC SURGERY    . CARDIAC CATHETERIZATION     [facility]- 30 yrs. ago  . CATARACT EXTRACTION, BILATERAL     late 2018  . LEFT HEART CATH AND CORONARY ANGIOGRAPHY N/A 04/02/2018   Procedure: LEFT HEART CATH AND CORONARY ANGIOGRAPHY;  Surgeon: Martinique, Peter M, MD;  Location: [facility];  Service: Cardiovascular;  Laterality: N/A;  . OOPHORECTOMY    . pantallor arthrodesis with rod placement left foot    . TONSILLECTOMY    . TOTAL KNEE ARTHROPLASTY Left 11/30/2018   Procedure: TOTAL KNEE ARTHROPLASTY;  Surgeon: Gaynelle Arabian, MD;  Location: [facility];  Service: Orthopedics;  Laterality: Left;  . TOTAL SHOULDER ARTHROPLASTY Left 03/12/2012   Procedure: TOTAL SHOULDER ARTHROPLASTY;  Surgeon: Marin Shutter, MD;  Location: [facility];  Service: Orthopedics;  Laterality: Left;   Social History   Socioeconomic History  . Marital status: Widowed    Spouse name: Not on file  . Number of children: Not on file  . Years of education: Not on file  . Highest education level: Not on file  Occupational History  . Occupation: retired Optician, dispensing: RETIRED  Tobacco Use  . Smoking status: Former Smoker    Packs/day: 0.70    Years: 20.00    Pack years:  14.00    Quit date: 03/04/1978    Years since quitting: 41.2  . Smokeless tobacco: Never Used  Substance and Sexual Activity  . Alcohol use: Yes    Alcohol/week: 2.0 standard drinks    Types: 2 Glasses of wine per week    Comment: with dinner  or social rare -   . Drug use: No  . Sexual activity: Not Currently  Other Topics Concern  . Not on file  Social History Narrative   Widowed 2013. 2 kids. 4 grandkids. Oldest daughter lives in [place] and has 2 grandkids that are now in Scientist, physiological, PT school at [employer].       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 Difficulty of Paying Living Expenses:   Food Insecurity:   . Worried About Charity fundraiser in the Last Year:   . Arboriculturist in the Last Year:   Transportation Needs:   . Film/video editor (Medical):   Marland Kitchen Lack of Transportation (Non-Medical):   Physical Activity:   . Days of Exercise per  Week:   . Minutes of Exercise per Session:   Stress:   . Feeling of Stress :   Social Connections:   . Frequency of Communication with Friends and Family:   . Frequency of Social Gatherings with Friends and Family:   . Attends Religious Services:   . Active Member of Clubs or Organizations:   . Attends Archivist Meetings:   Marland Kitchen Marital Status:    Family History  Problem Relation Age of Onset  . Stroke Mother   . Liver cancer Father   . Cancer Father        liver  . Breast cancer Neg Hx    Allergies  Allergen Reactions  . Flexeril [Cyclobenzaprine]     On Pristiq  With possible serotonin reaction to flexeril  . Ace Inhibitors     States after a surgery she was told this. Not sure what the reason was.   . Azithromycin     REACTION: Rash  . Codeine     REACTION: Upset stomach   Prior to Admission medications   Medication Sig Start Date End Date  Taking? Authorizing Danajah Birdsell  acetaminophen (TYLENOL) 500 MG tablet Take 1,000 mg by mouth every 8 (eight) hours as needed for mild pain or headache.   Yes Rosali Augello, Historical, MD  albuterol (PROVENTIL HFA;VENTOLIN HFA) 108 (90 Base) MCG/ACT inhaler Inhale 1-2 puffs into the lungs every 4 (four) hours as needed for wheezing or shortness of breath. 02/15/15  Yes Marin Olp, MD  amLODipine (NORVASC) 10 MG tablet TAKE 1 TABLET EVERY DAY 03/29/19  Yes Marin Olp, MD  aspirin EC 81 MG tablet Take 81 mg by mouth daily.   Yes Giannah Zavadil, Historical, MD  Blood Glucose Monitoring Suppl (TRUE METRIX AIR GLUCOSE METER) w/Device KIT USE AS DIRECTED 03/31/18  Yes Marin Olp, MD  Cholecalciferol (VITAMIN D-3) 125 MCG (5000 UT) TABS Take 5,000 Units by mouth daily.   Yes Cataleyah Colborn, Historical, MD  cyanocobalamin (,VITAMIN B-12,) 1000 MCG/ML injection Inject 1 mL (1,000 mcg total) into the muscle every 30 (thirty) days. Uses on amonthly basis 11/18/18  Yes Marin Olp, MD  desvenlafaxine (PRISTIQ) 100 MG 24 hr tablet TAKE 1 TABLET EVERY DAY 03/29/19  Yes Marin Olp, MD  diclofenac (VOLTAREN) 75 MG EC tablet TAKE 1 TABLET TWICE DAILY 02/02/19  Yes Marin Olp, MD  Evolocumab (REPATHA SURECLICK) 725 MG/ML SOAJ Inject 1 pen into the skin every 14 (fourteen) days. 01/27/19  Yes Belva Crome, MD  FeFum-FePo-FA-B Cmp-C-Zn-Mn-Cu [facility] PLUS) [phone] MG CAPS Take 1 capsule by mouth twice daily 03/09/19  Yes Marin Olp, MD  fluticasone [facility]) 50 MCG/ACT nasal spray USE 1 SPRAY IN EACH NOSTRIL EVERY DAY 03/29/19  Yes Marin Olp, MD  glucose blood test strip Test blood sugar once per day 05/27/17  Yes Marin Olp, MD  Levothyroxine Sodium (TIROSINT) 150 MCG CAPS Take 300 mcg by mouth daily before breakfast.  09/18/17  Yes Deryl Giroux, Historical, MD  loperamide (IMODIUM) 2 MG capsule Take 4 mg by mouth as needed for diarrhea or loose stools.   Yes Karisha Marlin, Historical, MD   montelukast (SINGULAIR) 10 MG tablet TAKE 1 TABLET EVERY DAY WITH BREAKFAST 04/09/18  Yes Marin Olp, MD  nitroGLYCERIN (NITROSTAT) 0.4 MG SL tablet Place 1 tablet (0.4 mg total) under the tongue every 5 (five) minutes as needed for chest pain. 09/29/17 11/18/19 Yes Belva Crome, MD  Omega-3 Fatty Acids (FISH OIL) 1000  MG CAPS Take 1,000 mg by mouth daily.   Yes Raphael Espe, Historical, MD  Propylene Glycol (SYSTANE COMPLETE OP) Apply 1 drop to eye 2 (two) times daily as needed (dryness).    Yes October Peery, Historical, MD  TRUEplus Lancets 30G MISC Check blood sugar once per day 05/11/18  Yes Marin Olp, MD     Positive ROS: Otherwise negative  All other systems have been reviewed and were otherwise negative with the exception of those mentioned in the HPI and as above.  Physical Exam: Constitutional: Alert, well-appearing, no acute distress Ears: External ears without lesions or tenderness. Ear canals mild amount of wax buildup in both ear canals with the left side completely occluded.  She has small ear canals.  TMs are clear bilaterally.. Nasal: External nose without lesions. Clear nasal passages Oral: Oropharynx clear. Neck: No palpable adenopathy or masses Respiratory: Breathing comfortably  Skin: No facial/neck lesions or rash noted.  Cerumen impaction removal  Date/Time: 06/04/2019 2:02 PM Performed by: Rozetta Nunnery, MD Authorized by: Rozetta Nunnery, MD   Consent:    Consent obtained:  Verbal   Consent given by:  Patient   Risks discussed:  Pain and bleeding Procedure details:    Location:  L ear and R ear   Procedure type: curette and forceps   Post-procedure details:    Inspection:  TM intact and canal normal   Hearing quality:  Improved   Patient tolerance of procedure:  Tolerated well, no immediate complications Comments:     TMs are clear bilaterally    Assessment: Bilateral cerumen impactions  Plan: She will follow-up as  needed  Radene Journey, MD

## 2019-06-08 ENCOUNTER — Other Ambulatory Visit: Payer: Self-pay | Admitting: Family Medicine

## 2019-06-14 NOTE — Patient Instructions (Addendum)
DUE TO COVID-19 ONLY ONE VISITOR IS ALLOWED TO COME WITH YOU AND STAY IN THE WAITING ROOM ONLY DURING PRE OP AND PROCEDURE DAY OF SURGERY. THE 1 VISITOR MAY VISIT WITH YOU AFTER SURGERY IN YOUR PRIVATE ROOM DURING VISITING HOURS ONLY!  YOU NEED TO HAVE A COVID 19 TEST ON 06-24-19 @ 2:50 PM. THIS TEST MUST BE DONE BEFORE SURGERY, COME  [place], [address] [place] , [address].  ([place]) ONCE YOUR COVID TEST IS COMPLETED, PLEASE BEGIN THE QUARANTINE INSTRUCTIONS AS OUTLINED IN YOUR HANDOUT.                [name]  06/14/2019   Your procedure is scheduled on: 06-28-19   Report to [facility] Main  Entrance    Report to Admitting at 5:30 AM     Call this number if you have problems the morning of surgery [phone]    Remember: NO SOLID FOOD AFTER MIDNIGHT THE NIGHT PRIOR TO SURGERY. NOTHING BY MOUTH EXCEPT CLEAR LIQUIDS UNTIL 4:15 AM . PLEASE FINISH ENSURE DRINK PER SURGEON ORDER  WHICH NEEDS TO BE COMPLETED AT 4:15 AM .   CLEAR LIQUID DIET   Foods Allowed                                                                     Foods Excluded  Coffee and tea, regular and decaf                             liquids that you cannot  Plain Jell-O any favor except red or purple                                           see through such as: Fruit ices (not with fruit pulp)                                     milk, soups, orange juice  Iced Popsicles                                    All solid food Carbonated beverages, regular and diet                                    Cranberry, grape and apple juices Sports drinks like Gatorade Lightly seasoned clear broth or consume(fat free) Sugar, honey syrup   _____________________________________________________________________       Take these medicines the morning of surgery with A SIP OF WATER: Amlodipine (Norvasc), Desvenlafine (Pristiq), Levothyroxine Sodium, Montelukast (Singular). You may also use your  eyedrops and nasal spray  BRUSH YOUR TEETH MORNING OF SURGERY AND RINSE YOUR MOUTH OUT, NO CHEWING GUM CANDY OR MINTS.  You may not have any metal on your body including hair pins and              piercings     Do not wear jewelry, make-up, lotions, powders or perfumes, deodorant              Do not wear nail polish on your fingernails.  Do not shave  48 hours prior to surgery.                 Do not bring valuables to the hospital. [place].  Contacts, dentures or bridgework may not be worn into surgery.  You may bring a small overnight bag    Special Instructions: N/A              Please read over the following fact sheets you were given: _____________________________________________________________________             [facility] - Preparing for Surgery Before surgery, you can play an important role.  Because skin is not sterile, your skin needs to be as free of germs as possible.  You can reduce the number of germs on your skin by washing with CHG (chlorahexidine gluconate) soap before surgery.  CHG is an antiseptic cleaner which kills germs and bonds with the skin to continue killing germs even after washing. Please DO NOT use if you have an allergy to CHG or antibacterial soaps.  If your skin becomes reddened/irritated stop using the CHG and inform your nurse when you arrive at Short Stay. Do not shave (including legs and underarms) for at least 48 hours prior to the first CHG shower.  You may shave your face/neck. Please follow these instructions carefully:  1.  Shower with CHG Soap the night before surgery and the  morning of Surgery.  2.  If you choose to wash your hair, wash your hair first as usual with your  normal  shampoo.  3.  After you shampoo, rinse your hair and body thoroughly to remove the  shampoo.                           4.  Use CHG as you would any other liquid soap.  You can apply  chg directly  to the skin and wash                       Gently with a scrungie or clean washcloth.  5.  Apply the CHG Soap to your body ONLY FROM THE NECK DOWN.   Do not use on face/ open                           Wound or open sores. Avoid contact with eyes, ears mouth and genitals (private parts).                       Wash face,  Genitals (private parts) with your normal soap.             6.  Wash thoroughly, paying special attention to the area where your surgery  will be performed.  7.  Thoroughly rinse your body with warm water from the neck down.  8.  DO NOT shower/wash with your normal soap after using and rinsing off  the CHG Soap.                9.  Pat yourself dry with a clean towel.            10.  Wear clean pajamas.            11.  Place clean sheets on your bed the night of your first shower and do not  sleep with pets. Day of Surgery : Do not apply any lotions/deodorants the morning of surgery.  Please wear clean clothes to the hospital/surgery center.  FAILURE TO FOLLOW THESE INSTRUCTIONS MAY RESULT IN THE CANCELLATION OF YOUR SURGERY PATIENT SIGNATURE_________________________________  NURSE SIGNATURE__________________________________  ________________________________________________________________________   [name]  An incentive spirometer is a tool that can help keep your lungs clear and active. This tool measures how well you are filling your lungs with each breath. Taking long deep breaths may help reverse or decrease the chance of developing breathing (pulmonary) problems (especially infection) following:  A long period of time when you are unable to move or be active. BEFORE THE PROCEDURE   If the spirometer includes an indicator to show your best effort, your nurse or respiratory therapist will set it to a desired goal.  If possible, sit up straight or lean slightly forward. Try not to slouch.  Hold the incentive spirometer in an upright  position. INSTRUCTIONS FOR USE  1. Sit on the edge of your bed if possible, or sit up as far as you can in bed or on a chair. 2. Hold the incentive spirometer in an upright position. 3. Breathe out normally. 4. Place the mouthpiece in your mouth and seal your lips tightly around it. 5. Breathe in slowly and as deeply as possible, raising the piston or the ball toward the top of the column. 6. Hold your breath for 3-5 seconds or for as long as possible. Allow the piston or ball to fall to the bottom of the column. 7. Remove the mouthpiece from your mouth and breathe out normally. 8. Rest for a few seconds and repeat Steps 1 through 7 at least 10 times every 1-2 hours when you are awake. Take your time and take a few normal breaths between deep breaths. 9. The spirometer may include an indicator to show your best effort. Use the indicator as a goal to work toward during each repetition. 10. After each set of 10 deep breaths, practice coughing to be sure your lungs are clear. If you have an incision (the cut made at the time of surgery), support your incision when coughing by placing a pillow or rolled up towels firmly against it. Once you are able to get out of bed, walk around indoors and cough well. You may stop using the incentive spirometer when instructed by your caregiver.  RISKS AND COMPLICATIONS  Take your time so you do not get dizzy or light-headed.  If you are in pain, you may need to take or ask for pain medication before doing incentive spirometry. It is harder to take a deep breath if you are having pain. AFTER USE  Rest and breathe slowly and easily.  It can be helpful to keep track of a log of your progress. Your caregiver can provide you with a simple table to help with this. If you are using the spirometer at home, follow these instructions: [place] IF:   You are having difficultly using the spirometer.  You have trouble using the spirometer as often  as  instructed.  Your pain medication is not giving enough relief while using the spirometer.  You develop fever of 100.5 F (38.1 C) or higher. SEEK IMMEDIATE MEDICAL CARE IF:   You cough up bloody sputum that had not been present before.  You develop fever of 102 F (38.9 C) or greater.  You develop worsening pain at or near the incision site. MAKE SURE YOU:   Understand these instructions.  Will watch your condition.  Will get help right away if you are not doing well or get worse. Document Released: 05/20/2006 Document Revised: 04/01/2011 Document Reviewed: 07/21/2006 [employer] Patient Information 2014 [employer], [place].   ________________________________________________________________________  WHAT IS A BLOOD TRANSFUSION? Blood Transfusion Information  A transfusion is the replacement of blood or some of its parts. Blood is made up of multiple cells which provide different functions.  Red blood cells carry oxygen and are used for blood loss replacement.  White blood cells fight against infection.  Platelets control bleeding.  Plasma helps clot blood.  Other blood products are available for specialized needs, such as hemophilia or other clotting disorders. BEFORE THE TRANSFUSION  Who gives blood for transfusions?   Healthy volunteers who are fully evaluated to make sure their blood is safe. This is blood bank blood. Transfusion therapy is the safest it has ever been in the practice of medicine. Before blood is taken from a donor, a complete history is taken to make sure that person has no history of diseases nor engages in risky social behavior (examples are intravenous drug use or sexual activity with multiple partners). The donor's travel history is screened to minimize risk of transmitting infections, such as malaria. The donated blood is tested for signs of infectious diseases, such as HIV and hepatitis. The blood is then tested to be sure it is compatible with you in  order to minimize the chance of a transfusion reaction. If you or a relative donates blood, this is often done in anticipation of surgery and is not appropriate for emergency situations. It takes many days to process the donated blood. RISKS AND COMPLICATIONS Although transfusion therapy is very safe and saves many lives, the main dangers of transfusion include:   Getting an infectious disease.  Developing a transfusion reaction. This is an allergic reaction to something in the blood you were given. Every precaution is taken to prevent this. The decision to have a blood transfusion has been considered carefully by your caregiver before blood is given. Blood is not given unless the benefits outweigh the risks. AFTER THE TRANSFUSION  Right after receiving a blood transfusion, you will usually feel much better and more energetic. This is especially true if your red blood cells have gotten low (anemic). The transfusion raises the level of the red blood cells which carry oxygen, and this usually causes an energy increase.  The nurse administering the transfusion will monitor you carefully for complications. HOME CARE INSTRUCTIONS  No special instructions are needed after a transfusion. You may find your energy is better. Speak with your caregiver about any limitations on activity for underlying diseases you may have. SEEK MEDICAL CARE IF:   Your condition is not improving after your transfusion.  You develop redness or irritation at the intravenous (IV) site. SEEK IMMEDIATE MEDICAL CARE IF:  Any of the following symptoms occur over the next 12 hours:  Shaking chills.  You have a temperature by mouth above 102 F (38.9 C), not controlled by medicine.  Chest, back, or muscle  pain.  People around you feel you are not acting correctly or are confused.  Shortness of breath or difficulty breathing.  Dizziness and fainting.  You get a rash or develop hives.  You have a decrease in urine  output.  Your urine turns a dark color or changes to pink, red, or brown. Any of the following symptoms occur over the next 10 days:  You have a temperature by mouth above 102 F (38.9 C), not controlled by medicine.  Shortness of breath.  Weakness after normal activity.  The white part of the eye turns yellow (jaundice).  You have a decrease in the amount of urine or are urinating less often.  Your urine turns a dark color or changes to pink, red, or brown. Document Released: 01/05/2000 Document Revised: 04/01/2011 Document Reviewed: 08/24/2007 [facility] Patient Information 2014 [place], [place].  _______________________________________________________________________

## 2019-06-14 NOTE — Progress Notes (Addendum)
PCP - Garret Reddish, MD LOV 05-31-19  Cardiologist - Daneen Schick, MD w/ cardiac clearance dated 04-14-19 f/u 1 year  No stimulator   Chest x-ray -  EKG - 10-06-18  Stress Test -  ECHO -  Cardiac Cath -   Sleep Study -  CPAP -   HGA1C 6.0  05-31-19 (LAB) Fasting Blood Sugar -  Checks Blood Sugar _____ times a day  Blood Thinner Instructions:  Aspirin Instructions: 81 mg (Recommendation to remain on ASA. However can be stopped if needed 5-7 days prior to surgery-per 04-14-19 telephone encounter).  Last Dose:Pt plan her last dose to be 06-20-19  Anesthesia review:   Patient denies shortness of breath, fever, cough and chest pain at PAT appointment   Patient verbalized understanding of instructions that were given to them at the PAT appointment. Patient was also instructed that they will need to review over the PAT instructions again at home before surgery.

## 2019-06-16 ENCOUNTER — Other Ambulatory Visit: Payer: Self-pay

## 2019-06-16 ENCOUNTER — Encounter (HOSPITAL_COMMUNITY)
Admission: RE | Admit: 2019-06-16 | Discharge: 2019-06-16 | Disposition: A | Discharge status: Home / Self Care | Payer: Medicare HMO | Source: Ambulatory Visit | Attending: Orthopedic Surgery | Admitting: Orthopedic Surgery

## 2019-06-16 ENCOUNTER — Encounter (HOSPITAL_COMMUNITY): Payer: Self-pay

## 2019-06-16 DIAGNOSIS — Z01812 Encounter for preprocedural laboratory examination: Secondary | ICD-10-CM | POA: Diagnosis not present

## 2019-06-16 LAB — CBC
HCT: 44.1 % (ref 36.0–46.0)
Hemoglobin: 14.2 g/dL (ref 12.0–15.0)
MCH: 29.6 pg (ref 26.0–34.0)
MCHC: 32.2 g/dL (ref 30.0–36.0)
MCV: 92.1 fL (ref 80.0–100.0)
Platelets: 198 10*3/uL (ref 150–400)
RBC: 4.79 MIL/uL (ref 3.87–5.11)
RDW: 13.7 % (ref 11.5–15.5)
WBC: 7.9 10*3/uL (ref 4.0–10.5)
nRBC: 0 % (ref 0.0–0.2)

## 2019-06-16 LAB — GLUCOSE, CAPILLARY: Glucose-Capillary: 83 mg/dL (ref 70–99)

## 2019-06-16 LAB — COMPREHENSIVE METABOLIC PANEL
ALT: 20 U/L (ref 0–44)
AST: 26 U/L (ref 15–41)
Albumin: 4.1 g/dL (ref 3.5–5.0)
Alkaline Phosphatase: 114 U/L (ref 38–126)
Anion gap: 11 (ref 5–15)
BUN: 22 mg/dL (ref 8–23)
CO2: 27 mmol/L (ref 22–32)
Calcium: 10.1 mg/dL (ref 8.9–10.3)
Chloride: 98 mmol/L (ref 98–111)
Creatinine, Ser: 0.84 mg/dL (ref 0.44–1.00)
GFR calc Af Amer: >60 mL/min (ref >60)
GFR calc non Af Amer: >60 mL/min (ref >60)
Glucose, Bld: 95 mg/dL (ref 70–99)
Potassium: 4.3 mmol/L (ref 3.5–5.1)
Sodium: 136 mmol/L (ref 135–145)
Total Bilirubin: 0.6 mg/dL (ref 0.3–1.2)
Total Protein: 7.2 g/dL (ref 6.5–8.1)

## 2019-06-16 LAB — PROTIME-INR
INR: 1 (ref 0.8–1.2)
Prothrombin Time: 13 seconds (ref 11.4–15.2)

## 2019-06-16 LAB — APTT: aPTT: 29 seconds (ref 24–36)

## 2019-06-16 LAB — SURGICAL PCR SCREEN
MRSA, PCR: NEGATIVE
Staphylococcus aureus: POSITIVE — AB

## 2019-06-16 LAB — TYPE AND SCREEN
ABO/RH(D): A POS
Antibody Screen: NEGATIVE

## 2019-06-23 NOTE — H&P (Signed)
TOTAL KNEE ADMISSION H&P  Patient is being admitted for right total knee arthroplasty.  Subjective:  Chief Complaint: Right knee pain.  HPI: [name], 79 y.o. female has a history of pain and functional disability in the right knee due to arthritis and has failed non-surgical conservative treatments for greater than 12 weeks to include corticosteriod injections and activity modification. Onset of symptoms was gradual, starting >10 years ago with gradually worsening course since that time. The patient noted no past surgery on the right knee.  Patient currently rates pain in the right knee at 8 out of 10 with activity. Patient has worsening of pain with activity and weight bearing, pain that interferes with activities of daily living and crepitus. Patient has evidence of bone-on-bone arthritis in the medial and patellofemoral compartments, with marginal osteophyte formation throughout and slight lateral tibial subluxation by imaging studies. There is no active infection.  Patient Active Problem List   Diagnosis Date Noted   B12 deficiency 05/31/2019   Coronary artery disease of native artery of native heart with stable angina pectoris ([place]) 08/31/2018   Chest pain with high risk for cardiac etiology 04/02/2018   Hypercholesteremia    History of polymyalgia rheumatica 03/16/2018   CAD (coronary artery disease) 11/19/2017   Anxiety 07/17/2017   Osteopenia of left hip 07/17/2017   Overactive bladder 09/03/2016   Morbid obesity ([place]) 06/28/2015   Hypercalcemia 12/29/2014   Allergic rhinitis 07/20/2014   History of gastric bypass 02/08/2014   OA (osteoarthritis) of knee 10/05/2013   Hyperlipidemia associated with type 2 diabetes mellitus ([place]) 01/05/2008   CHEST WALL PAIN, ANTERIOR 10/09/2007   Hypothyroidism 09/09/2007   Well controlled type 2 diabetes mellitus ([place]) 07/18/2006   Major depression, recurrent, full remission ([place]) 07/18/2006   Hypertension  associated with diabetes ([place]) 07/18/2006   Asthma 07/18/2006    Past Medical History:  Diagnosis Date   Anemia    Arthritis    osteoarthritis - shoulder, knees & hips   Asthma    Coronary artery disease    Depression    Diabetes mellitus without complication ([place])    no meds   Hypertension    Dr. Orinda Kenner manages BP, pt. reports MD has not found a need for treatment    Hypothyroidism    Sleep difficulties    had sleep study -2009, prior to gastric surgery, told that there was not a need for f/u   Thyroid disease    Vitamin B 12 deficiency     Past Surgical History:  Procedure Laterality Date   ABDOMINAL HYSTERECTOMY     BARIATRIC [place]     [facility]- 30 yrs. ago   CATARACT EXTRACTION, BILATERAL     late 2018   LEFT HEART CATH AND CORONARY ANGIOGRAPHY N/A 04/02/2018   Procedure: LEFT HEART CATH AND CORONARY ANGIOGRAPHY;  Surgeon: Martinique, Peter M, MD;  Location: [place] [facility];  Service: Cardiovascular;  Laterality: N/A;   OOPHORECTOMY     pantallor arthrodesis with rod placement left foot     TONSILLECTOMY     TOTAL KNEE ARTHROPLASTY Left 11/30/2018   Procedure: TOTAL KNEE ARTHROPLASTY;  Surgeon: Gaynelle Arabian, MD;  Location: [facility];  Service: Orthopedics;  Laterality: Left;   TOTAL SHOULDER ARTHROPLASTY Left 03/12/2012   Procedure: TOTAL SHOULDER ARTHROPLASTY;  Surgeon: Marin Shutter, MD;  Location: [place];  Service: Orthopedics;  Laterality: Left;    Prior to Admission medications   Medication Sig Start  Date End Date Taking? Authorizing Provider  acetaminophen (TYLENOL) 500 MG tablet Take 1,000 mg by mouth every 8 (eight) hours as needed for mild pain or headache.   Yes [provider]  albuterol (PROVENTIL HFA;VENTOLIN HFA) 108 (90 Base) MCG/ACT inhaler Inhale 1-2 puffs into the lungs every 4 (four) hours as needed for wheezing or shortness of breath. 02/15/15  Yes Marin Olp, MD  amLODipine (NORVASC)  10 MG tablet TAKE 1 TABLET EVERY DAY Patient taking differently: Take 10 mg by mouth daily.  06/09/19  Yes Marin Olp, MD  aspirin EC 81 MG tablet Take 81 mg by mouth daily.   Yes [provider]  Cholecalciferol (VITAMIN D-3) 125 MCG (5000 UT) TABS Take 5,000 Units by mouth daily.   Yes [provider]  cyanocobalamin (,VITAMIN B-12,) 1000 MCG/ML injection Inject 1 mL (1,000 mcg total) into the muscle every 30 (thirty) days. Uses on amonthly basis 11/18/18  Yes Marin Olp, MD  desvenlafaxine (PRISTIQ) 100 MG 24 hr tablet TAKE 1 TABLET EVERY DAY Patient taking differently: Take 100 mg by mouth daily.  06/09/19  Yes Marin Olp, MD  diclofenac (VOLTAREN) 75 MG EC tablet TAKE 1 TABLET TWICE DAILY Patient taking differently: Take 75 mg by mouth 2 (two) times daily.  02/02/19  Yes Marin Olp, MD  Evolocumab (REPATHA SURECLICK) 808 MG/ML SOAJ Inject 1 pen into the skin every 14 (fourteen) days. Patient taking differently: Inject 140 mg into the skin every 14 (fourteen) days.  01/27/19  Yes Belva Crome, MD  FeFum-FePo-FA-B Cmp-C-Zn-Mn-Cu (SE-TAN PLUS) 811-031.5-9 MG CAPS Take 1 capsule by mouth twice daily Patient taking differently: Take 1 capsule by mouth in the morning and at bedtime.  03/09/19  Yes Marin Olp, MD  fluticasone (FLONASE) 50 MCG/ACT nasal spray USE 1 SPRAY IN EACH NOSTRIL EVERY DAY Patient taking differently: Place 1 spray into both nostrils daily.  06/09/19  Yes Marin Olp, MD  HYDROcodone-acetaminophen (NORCO/VICODIN) 5-325 MG tablet Take 1 tablet by mouth every 6 (six) hours as needed for moderate pain or severe pain.   Yes [provider]  Levothyroxine Sodium (TIROSINT) 150 MCG CAPS Take 300 mcg by mouth daily before breakfast.  09/18/17  Yes [provider]  loperamide (IMODIUM) 2 MG capsule Take 4 mg by mouth daily as needed for diarrhea or loose stools.    Yes [provider]  montelukast  (SINGULAIR) 10 MG tablet TAKE 1 TABLET EVERY DAY WITH BREAKFAST Patient taking differently: Take 10 mg by mouth daily.  04/09/18  Yes Marin Olp, MD  nitroGLYCERIN (NITROSTAT) 0.4 MG SL tablet Place 1 tablet (0.4 mg total) under the tongue every 5 (five) minutes as needed for chest pain. 09/29/17 11/18/19 Yes Belva Crome, MD  Omega-3 Fatty Acids (FISH OIL) 1000 MG CAPS Take 5,000 mg by mouth daily.    Yes [provider]  Propylene Glycol (SYSTANE COMPLETE OP) Apply 1 drop to eye 2 (two) times daily as needed (dryness).    Yes [provider]  Blood Glucose Monitoring Suppl (TRUE METRIX AIR GLUCOSE METER) w/Device KIT USE AS DIRECTED 03/31/18   Marin Olp, MD  glucose blood test strip Test blood sugar once per day 05/27/17   Marin Olp, MD  TRUEplus Lancets 30G MISC Check blood sugar once per day 05/11/18   Marin Olp, MD    Allergies  Allergen Reactions   Flexeril [Cyclobenzaprine]     On Pristiq  With possible serotonin reaction to flexeril   Ace Inhibitors     States after a surgery she was told this. Not sure what the reason was.    Azithromycin     REACTION: Rash   Codeine     REACTION: Upset stomach    Social History   Socioeconomic History   Marital status: Widowed    Spouse name: Not on file   Number of children: Not on file   Years of education: Not on file   Highest education level: Not on file  Occupational History   Occupation: retired Optician, dispensing: RETIRED  Tobacco Use   Smoking status: Former Smoker    Packs/day: 0.70    Years: 20.00    Pack years: 14.00    Quit date: 03/04/1978    Years since quitting: 41.3   Smokeless tobacco: Never Used  Substance and Sexual Activity   Alcohol use: Yes    Alcohol/week: 2.0 standard drinks    Types: 2 Glasses of wine per week    Comment: with dinner  or social rare -    Drug use: No   Sexual activity: Not Currently  Other Topics Concern   Not on file  Social  History Narrative   Widowed 2013. 2 kids. 4 grandkids. Oldest daughter lives in [place] and has 2 grandkids that are now in Scientist, physiological, PT school at [employer].       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 [employer]: play cards with group, active with [employer] ([place])   Social Determinants of Health   Financial Resource Strain:    Difficulty of Paying Living Expenses:   Food Insecurity:    Worried About Charity fundraiser in the Last Year:    Arboriculturist in the Last Year:   Transportation Needs:    Film/video editor (Medical):    Lack of Transportation (Non-Medical):   Physical Activity:    Days of Exercise per Week:    Minutes of Exercise per Session:   Stress:    Feeling of Stress :   Social Connections:    Frequency of Communication with Friends and Family:    Frequency of Social Gatherings with Friends and Family:    Attends Religious Services:    Active Member of Clubs or Organizations:    Attends Archivist Meetings:    Marital Status:   Intimate Partner Violence:    Fear of Current or Ex-Partner:    Emotionally Abused:    Physically Abused:    Sexually Abused:       Tobacco Use: Medium Risk   Smoking Tobacco Use: Former Smoker   Smokeless Tobacco Use: Never Used   Social History   Substance and Sexual Activity  Alcohol Use Yes   Alcohol/week: 2.0 standard drinks   Types: 2 Glasses of wine per week   Comment: with dinner  or social rare -     Family History  Problem Relation Age of Onset   Stroke Mother    Liver cancer Father    Cancer Father        liver   Breast cancer Neg Hx     Review of Systems  Constitutional: Negative for chills and fever.  HENT: Negative for congestion, sore throat and tinnitus.   Eyes: Negative for double vision, photophobia and pain.  Respiratory: Negative for cough, shortness of breath and wheezing.   Cardiovascular: Negative  for chest pain,  palpitations and orthopnea.  Gastrointestinal: Negative for heartburn, nausea and vomiting.  Genitourinary: Negative for dysuria, frequency and urgency.  Musculoskeletal: Positive for joint pain.  Neurological: Negative for dizziness, weakness and headaches.      Objective:  Physical Exam: Well nourished and well developed.  General: Alert and oriented x3, cooperative and pleasant, no acute distress.  Head: normocephalic, atraumatic, neck supple.  Eyes: EOMI.  Respiratory: breath sounds clear in all fields, no wheezing, rales, or rhonchi. Cardiovascular: Regular rate and rhythm, no murmurs, gallops or rubs.  Abdomen: non-tender to palpation and soft, normoactive bowel sounds. Musculoskeletal:  Right Knee Exam: No swelling. Mild tenderness to palpation of the medial joint line. No instability. Mild patellofemoral crepitus.   Calves soft and nontender. Motor function intact in LE. Strength 5/5 LE bilaterally. Neuro: Distal pulses 2+. Sensation to light touch intact in LE.  Imaging Review Plain radiographs demonstrate severe degenerative joint disease of the right knee. The overall alignment is neutral. The bone quality appears to be adequate for age and reported activity level.  Assessment/Plan:  End stage arthritis, right knee   The patient history, physical examination, clinical judgment of the provider and imaging studies are consistent with end stage degenerative joint disease of the right knee and total knee arthroplasty is deemed medically necessary. The treatment options including medical management, injection therapy arthroscopy and arthroplasty were discussed at length. The risks and benefits of total knee arthroplasty were presented and reviewed. The risks due to aseptic loosening, infection, stiffness, patella tracking problems, thromboembolic complications and other imponderables were discussed. The patient acknowledged the explanation, agreed to proceed with the plan and  consent was signed. Patient is being admitted for inpatient treatment for surgery, pain control, PT, OT, prophylactic antibiotics, VTE prophylaxis, progressive ambulation and ADLs and discharge planning. The patient is planning to be discharged home.   Patient's anticipated LOS is less than 2 midnights, meeting these requirements: - Younger than 94 - Lives within 1 hour of care - Has a competent adult at home to recover with post-op recover - NO history of  - Chronic pain requiring opiods  - Coronary Artery Disease  - Heart failure  - Heart attack  - Stroke  - DVT/VTE  - Cardiac arrhythmia  - Respiratory Failure/COPD  - Renal failure  - Anemia  - Advanced Liver disease  Therapy Plans: Outpatient therapy at [facility] Disposition: Home with daughter Planned DVT Prophylaxis: Aspirin 325 mg BID DME Needed: None PCP: Garret Reddish, MD (appt 5/10) Cardiologist: Daneen Schick, MD (clearance provided) TXA: IV Allergies: ACEI, codeine, cyclobenzaprine Anesthesia Concerns: None BMI: 38.7 Last HgbA1c: 6.0%  Other:  - Dilaudid PO and IV with previous left TKA - Does not want gabapentin - Wants minimal medications in hospital (due to cost)  - Patient was instructed on what medications to stop prior to surgery. - Follow-up visit in 2 weeks with Dr. Wynelle Link - Begin physical therapy following surgery - Pre-operative lab work as pre-surgical testing - Prescriptions will be provided in hospital at time of discharge  Theresa Duty, PA-C Orthopedic Surgery [facility] [place]

## 2019-06-24 ENCOUNTER — Other Ambulatory Visit (HOSPITAL_COMMUNITY)
Admission: RE | Admit: 2019-06-24 | Discharge: 2019-06-24 | Disposition: A | Discharge status: Home / Self Care | Payer: Medicare HMO | Source: Ambulatory Visit | Attending: Orthopedic Surgery | Admitting: Orthopedic Surgery

## 2019-06-24 ENCOUNTER — Other Ambulatory Visit: Payer: Self-pay | Admitting: Family Medicine

## 2019-06-24 DIAGNOSIS — Z20822 Contact with and (suspected) exposure to covid-19: Secondary | ICD-10-CM | POA: Diagnosis not present

## 2019-06-24 DIAGNOSIS — Z01812 Encounter for preprocedural laboratory examination: Secondary | ICD-10-CM | POA: Diagnosis not present

## 2019-06-24 LAB — SARS CORONAVIRUS 2 (TAT 6-24 HRS): SARS Coronavirus 2: NEGATIVE

## 2019-06-26 NOTE — Anesthesia Preprocedure Evaluation (Addendum)
Anesthesia Evaluation  Patient identified by MRN, date of birth, ID band Patient awake    Reviewed: Allergy & Precautions, NPO status , Patient's Chart, lab work & pertinent test results  History of Anesthesia Complications Negative for: history of anesthetic complications  Airway Mallampati: II       Dental no notable dental hx. (+) Teeth Intact   Pulmonary asthma , former smoker,    Pulmonary exam normal        Cardiovascular hypertension, Pt. on medications + CAD  Normal cardiovascular exam Rhythm:Regular Rate:Normal  Nuclear stress 2019: EF 65%, low risk study    Neuro/Psych Anxiety Depression negative neurological ROS     GI/Hepatic negative GI ROS, Neg liver ROS,   Endo/Other  diabetes, Type 2Hypothyroidism   Renal/GU negative Renal ROS  negative genitourinary   Musculoskeletal  (+) Arthritis ,   Abdominal (+) + obese,   Peds  Hematology negative hematology ROS (+)   Anesthesia Other Findings Day of surgery medications reviewed with patient.  Reproductive/Obstetrics negative OB ROS                                                              Anesthesia Evaluation  Patient identified by MRN, date of birth, ID band Patient awake    Reviewed: Allergy & Precautions, NPO status , Patient's Chart, lab work & pertinent test results  Airway Mallampati: I  TM Distance: >3 FB Neck ROM: Full    Dental  (+) Dental Advisory Given, Upper Dentures, Lower Dentures   Pulmonary asthma , former smoker,    breath sounds clear to auscultation       Cardiovascular hypertension, Pt. on medications + CAD   Rhythm:Regular Rate:Normal     Neuro/Psych Anxiety Depression    GI/Hepatic negative GI ROS, Neg liver ROS,   Endo/Other  diabetesHypothyroidism   Renal/GU negative Renal ROS     Musculoskeletal  (+) Arthritis ,   Abdominal (+) + obese,   Peds  Hematology    Anesthesia Other Findings   Reproductive/Obstetrics                           Lab Results  Component Value Date   WBC 7.9 06/16/2019   HGB 14.2 06/16/2019   HCT 44.1 06/16/2019   MCV 92.1 06/16/2019   PLT 198 06/16/2019   Lab Results  Component Value Date   CREATININE 0.84 06/16/2019   BUN 22 06/16/2019   NA 136 06/16/2019   K 4.3 06/16/2019   CL 98 06/16/2019   CO2 27 06/16/2019   Lab Results  Component Value Date   INR 1.0 06/16/2019   INR 1.1 11/25/2018     Anesthesia Physical Anesthesia Plan  ASA: III  Anesthesia Plan: Spinal   Post-op Pain Management:  Regional for Post-op pain   Induction: Intravenous  PONV Risk Score and Plan: 3 and Ondansetron, Dexamethasone and Propofol infusion  Airway Management Planned: Natural Airway and Simple Face Mask  Additional Equipment: None  Intra-op Plan:   Post-operative Plan:   Informed Consent: I have reviewed the patients History and Physical, chart, labs and discussed the procedure including the risks, benefits and alternatives for the proposed anesthesia with the patient or authorized representative who has indicated his/her understanding  and acceptance.     Dental advisory given  Plan Discussed with: CRNA  Anesthesia Plan Comments: (See PAT note 11/25/2018, Konrad Felix, PA-C)      Anesthesia Quick Evaluation  Anesthesia Physical Anesthesia Plan  ASA: II  Anesthesia Plan: Spinal   Post-op Pain Management:  Regional for Post-op pain   Induction:   PONV Risk Score and Plan: 3 and Treatment may vary due to age or medical condition, Ondansetron, Propofol infusion and Dexamethasone  Airway Management Planned: Natural Airway and Simple Face Mask  Additional Equipment: None  Intra-op Plan:   Post-operative Plan:   Informed Consent: I have reviewed the patients History and Physical, chart, labs and discussed the procedure including the risks, benefits and alternatives  for the proposed anesthesia with the patient or authorized representative who has indicated his/her understanding and acceptance.       Plan Discussed with: CRNA  Anesthesia Plan Comments:        Anesthesia Quick Evaluation

## 2019-06-27 MED ORDER — BUPIVACAINE LIPOSOME 1.3 % IJ SUSP
20.0000 mL | Freq: Once | INTRAMUSCULAR | Status: DC
  Filled 2019-06-27: qty 20

## 2019-06-28 ENCOUNTER — Ambulatory Visit (HOSPITAL_COMMUNITY): Payer: Medicare HMO | Admitting: Anesthesiology

## 2019-06-28 ENCOUNTER — Other Ambulatory Visit: Payer: Self-pay

## 2019-06-28 ENCOUNTER — Ambulatory Visit (HOSPITAL_COMMUNITY)
Admission: RE | Admit: 2019-06-28 | Discharge: 2019-06-29 | Disposition: A | Discharge status: Home / Self Care | Payer: Medicare HMO | Source: Ambulatory Visit | Attending: Orthopedic Surgery | Admitting: Orthopedic Surgery

## 2019-06-28 ENCOUNTER — Encounter (HOSPITAL_COMMUNITY)
Admission: RE | Disposition: A | Discharge status: Home / Self Care | Payer: Self-pay | Source: Ambulatory Visit | Attending: Orthopedic Surgery

## 2019-06-28 ENCOUNTER — Encounter (HOSPITAL_COMMUNITY): Payer: Self-pay | Admitting: Orthopedic Surgery

## 2019-06-28 DIAGNOSIS — Z881 Allergy status to other antibiotic agents status: Secondary | ICD-10-CM | POA: Insufficient documentation

## 2019-06-28 DIAGNOSIS — E039 Hypothyroidism, unspecified: Secondary | ICD-10-CM | POA: Diagnosis not present

## 2019-06-28 DIAGNOSIS — Z96652 Presence of left artificial knee joint: Secondary | ICD-10-CM | POA: Insufficient documentation

## 2019-06-28 DIAGNOSIS — Z79890 Hormone replacement therapy (postmenopausal): Secondary | ICD-10-CM | POA: Diagnosis not present

## 2019-06-28 DIAGNOSIS — M1711 Unilateral primary osteoarthritis, right knee: Secondary | ICD-10-CM | POA: Diagnosis not present

## 2019-06-28 DIAGNOSIS — I10 Essential (primary) hypertension: Secondary | ICD-10-CM | POA: Insufficient documentation

## 2019-06-28 DIAGNOSIS — Z7982 Long term (current) use of aspirin: Secondary | ICD-10-CM | POA: Insufficient documentation

## 2019-06-28 DIAGNOSIS — E785 Hyperlipidemia, unspecified: Secondary | ICD-10-CM | POA: Insufficient documentation

## 2019-06-28 DIAGNOSIS — Z96612 Presence of left artificial shoulder joint: Secondary | ICD-10-CM | POA: Insufficient documentation

## 2019-06-28 DIAGNOSIS — M25761 Osteophyte, right knee: Secondary | ICD-10-CM | POA: Insufficient documentation

## 2019-06-28 DIAGNOSIS — Z9884 Bariatric surgery status: Secondary | ICD-10-CM | POA: Diagnosis not present

## 2019-06-28 DIAGNOSIS — Z885 Allergy status to narcotic agent status: Secondary | ICD-10-CM | POA: Diagnosis not present

## 2019-06-28 DIAGNOSIS — J45909 Unspecified asthma, uncomplicated: Secondary | ICD-10-CM | POA: Insufficient documentation

## 2019-06-28 DIAGNOSIS — Z888 Allergy status to other drugs, medicaments and biological substances status: Secondary | ICD-10-CM | POA: Diagnosis not present

## 2019-06-28 DIAGNOSIS — G8918 Other acute postprocedural pain: Secondary | ICD-10-CM | POA: Diagnosis not present

## 2019-06-28 DIAGNOSIS — Z791 Long term (current) use of non-steroidal anti-inflammatories (NSAID): Secondary | ICD-10-CM | POA: Insufficient documentation

## 2019-06-28 DIAGNOSIS — Z6838 Body mass index (BMI) 38.0-38.9, adult: Secondary | ICD-10-CM | POA: Insufficient documentation

## 2019-06-28 DIAGNOSIS — E7800 Pure hypercholesterolemia, unspecified: Secondary | ICD-10-CM | POA: Diagnosis not present

## 2019-06-28 DIAGNOSIS — F419 Anxiety disorder, unspecified: Secondary | ICD-10-CM | POA: Diagnosis not present

## 2019-06-28 DIAGNOSIS — M238X1 Other internal derangements of right knee: Secondary | ICD-10-CM | POA: Insufficient documentation

## 2019-06-28 DIAGNOSIS — Z79899 Other long term (current) drug therapy: Secondary | ICD-10-CM | POA: Diagnosis not present

## 2019-06-28 DIAGNOSIS — M8588 Other specified disorders of bone density and structure, other site: Secondary | ICD-10-CM | POA: Diagnosis not present

## 2019-06-28 DIAGNOSIS — F329 Major depressive disorder, single episode, unspecified: Secondary | ICD-10-CM | POA: Insufficient documentation

## 2019-06-28 DIAGNOSIS — Z87891 Personal history of nicotine dependence: Secondary | ICD-10-CM | POA: Insufficient documentation

## 2019-06-28 DIAGNOSIS — I25118 Atherosclerotic heart disease of native coronary artery with other forms of angina pectoris: Secondary | ICD-10-CM | POA: Diagnosis not present

## 2019-06-28 HISTORY — PX: TOTAL KNEE ARTHROPLASTY: SHX125

## 2019-06-28 LAB — GLUCOSE, CAPILLARY
Glucose-Capillary: 107 mg/dL — ABNORMAL HIGH (ref 70–99)
Glucose-Capillary: 127 mg/dL — ABNORMAL HIGH (ref 70–99)
Glucose-Capillary: 208 mg/dL — ABNORMAL HIGH (ref 70–99)
Glucose-Capillary: 158 mg/dL — ABNORMAL HIGH (ref 70–99)

## 2019-06-28 SURGERY — ARTHROPLASTY, KNEE, TOTAL
Anesthesia: Spinal | Site: Knee | Laterality: Right

## 2019-06-28 MED ORDER — SODIUM CHLORIDE (PF) 0.9 % IJ SOLN
INTRAMUSCULAR | Status: AC
  Filled 2019-06-28: qty 50

## 2019-06-28 MED ORDER — ONDANSETRON HCL 4 MG PO TABS: 4.0000 mg | ORAL_TABLET | Freq: Four times a day (QID) | ORAL | Status: DC | PRN

## 2019-06-28 MED ORDER — 0.9 % SODIUM CHLORIDE (POUR BTL) OPTIME
TOPICAL | Status: DC | PRN
  Administered 2019-06-28: 1000 mL

## 2019-06-28 MED ORDER — FENTANYL CITRATE (PF) 100 MCG/2ML IJ SOLN
INTRAMUSCULAR | Status: DC | PRN
  Administered 2019-06-28: 100 ug via INTRAVENOUS

## 2019-06-28 MED ORDER — ROPIVACAINE HCL 7.5 MG/ML IJ SOLN
INTRAMUSCULAR | Status: DC | PRN
Start: 2019-06-28 — End: 2019-06-28
  Administered 2019-06-28 (×4): 5 mL via PERINEURAL

## 2019-06-28 MED ORDER — OXYCODONE HCL 5 MG/5ML PO SOLN: 5.0000 mg | Freq: Once | ORAL | Status: DC | PRN

## 2019-06-28 MED ORDER — FENTANYL CITRATE (PF) 100 MCG/2ML IJ SOLN
INTRAMUSCULAR | Status: AC
  Filled 2019-06-28: qty 2

## 2019-06-28 MED ORDER — PHENYLEPHRINE 40 MCG/ML (10ML) SYRINGE FOR IV PUSH (FOR BLOOD PRESSURE SUPPORT)
PREFILLED_SYRINGE | INTRAVENOUS | Status: AC
  Filled 2019-06-28: qty 10

## 2019-06-28 MED ORDER — EPHEDRINE SULFATE-NACL 50-0.9 MG/10ML-% IV SOSY
PREFILLED_SYRINGE | INTRAVENOUS | Status: DC | PRN
  Administered 2019-06-28: 10 mg via INTRAVENOUS
  Administered 2019-06-28 (×2): 5 mg via INTRAVENOUS

## 2019-06-28 MED ORDER — BUPIVACAINE IN DEXTROSE 0.75-8.25 % IT SOLN
INTRATHECAL | Status: DC | PRN
  Administered 2019-06-28: 1.6 mL via INTRATHECAL

## 2019-06-28 MED ORDER — CLONIDINE HCL (ANALGESIA) 100 MCG/ML EP SOLN
EPIDURAL | Status: DC | PRN
  Administered 2019-06-28: 100 ug

## 2019-06-28 MED ORDER — METOCLOPRAMIDE HCL 5 MG/ML IJ SOLN: 5.0000 mg | Freq: Three times a day (TID) | INTRAMUSCULAR | Status: DC | PRN

## 2019-06-28 MED ORDER — AMLODIPINE BESYLATE 10 MG PO TABS: 10.0000 mg | ORAL_TABLET | Freq: Every day | ORAL | Status: DC

## 2019-06-28 MED ORDER — CEFAZOLIN SODIUM-DEXTROSE 2-4 GM/100ML-% IV SOLN
2.0000 g | Freq: Four times a day (QID) | INTRAVENOUS | Status: AC
  Administered 2019-06-28 (×2): 2 g via INTRAVENOUS
  Filled 2019-06-28 (×2): qty 100

## 2019-06-28 MED ORDER — MIDAZOLAM HCL 2 MG/2ML IJ SOLN
INTRAMUSCULAR | Status: AC
  Filled 2019-06-28: qty 2

## 2019-06-28 MED ORDER — DIPHENHYDRAMINE HCL 12.5 MG/5ML PO ELIX: 12.5000 mg | ORAL_SOLUTION | ORAL | Status: DC | PRN

## 2019-06-28 MED ORDER — MIDAZOLAM HCL 5 MG/5ML IJ SOLN
INTRAMUSCULAR | Status: DC | PRN
  Administered 2019-06-28: 2 mg via INTRAVENOUS

## 2019-06-28 MED ORDER — SODIUM CHLORIDE 0.9 % IV SOLN: INTRAVENOUS | Status: DC

## 2019-06-28 MED ORDER — ONDANSETRON HCL 4 MG/2ML IJ SOLN
INTRAMUSCULAR | Status: DC | PRN
  Administered 2019-06-28: 4 mg via INTRAVENOUS

## 2019-06-28 MED ORDER — BISACODYL 10 MG RE SUPP: 10.0000 mg | Freq: Every day | RECTAL | Status: DC | PRN

## 2019-06-28 MED ORDER — HYDROMORPHONE HCL 2 MG PO TABS
2.0000 mg | ORAL_TABLET | ORAL | Status: DC | PRN
  Administered 2019-06-28: 2 mg via ORAL
  Administered 2019-06-28 – 2019-06-29 (×5): 4 mg via ORAL
  Filled 2019-06-28: qty 1
  Filled 2019-06-28 (×2): qty 2
  Filled 2019-06-28: qty 1
  Filled 2019-06-28: qty 2
  Filled 2019-06-28: qty 1
  Filled 2019-06-28: qty 2

## 2019-06-28 MED ORDER — PHENOL 1.4 % MT LIQD: 1.0000 | OROMUCOSAL | Status: DC | PRN

## 2019-06-28 MED ORDER — LEVOTHYROXINE SODIUM 150 MCG PO TABS
300.0000 ug | ORAL_TABLET | Freq: Every day | ORAL | Status: DC
  Filled 2019-06-28: qty 2
  Filled 2019-06-28: qty 4

## 2019-06-28 MED ORDER — LACTATED RINGERS IV SOLN: INTRAVENOUS | Status: DC

## 2019-06-28 MED ORDER — ONDANSETRON HCL 4 MG/2ML IJ SOLN
INTRAMUSCULAR | Status: AC
  Filled 2019-06-28: qty 2

## 2019-06-28 MED ORDER — VENLAFAXINE HCL ER 150 MG PO CP24: 150.0000 mg | ORAL_CAPSULE | Freq: Every day | ORAL | Status: DC

## 2019-06-28 MED ORDER — PROPOFOL 500 MG/50ML IV EMUL
INTRAVENOUS | Status: DC | PRN
  Administered 2019-06-28: 40 ug/kg/min via INTRAVENOUS

## 2019-06-28 MED ORDER — PROMETHAZINE HCL 25 MG/ML IJ SOLN: 6.2500 mg | INTRAMUSCULAR | Status: DC | PRN

## 2019-06-28 MED ORDER — ASPIRIN EC 325 MG PO TBEC
325.0000 mg | DELAYED_RELEASE_TABLET | Freq: Two times a day (BID) | ORAL | Status: DC
  Administered 2019-06-29: 325 mg via ORAL
  Filled 2019-06-28: qty 1

## 2019-06-28 MED ORDER — SODIUM CHLORIDE (PF) 0.9 % IJ SOLN
INTRAMUSCULAR | Status: DC | PRN
  Administered 2019-06-28: 60 mL

## 2019-06-28 MED ORDER — ONDANSETRON HCL 4 MG/2ML IJ SOLN: 4.0000 mg | Freq: Four times a day (QID) | INTRAMUSCULAR | Status: DC | PRN

## 2019-06-28 MED ORDER — INSULIN ASPART 100 UNIT/ML ~~LOC~~ SOLN: 0.0000 [IU] | Freq: Every day | SUBCUTANEOUS | Status: DC

## 2019-06-28 MED ORDER — PROPOFOL 10 MG/ML IV BOLUS
INTRAVENOUS | Status: AC
  Filled 2019-06-28: qty 20

## 2019-06-28 MED ORDER — INSULIN ASPART 100 UNIT/ML ~~LOC~~ SOLN
0.0000 [IU] | Freq: Three times a day (TID) | SUBCUTANEOUS | Status: DC
  Administered 2019-06-28: 5 [IU] via SUBCUTANEOUS
  Administered 2019-06-29: 2 [IU] via SUBCUTANEOUS

## 2019-06-28 MED ORDER — ROPIVACAINE HCL 5 MG/ML IJ SOLN
INTRAMUSCULAR | Status: DC | PRN
  Administered 2019-06-28 (×2): 5 mL via PERINEURAL

## 2019-06-28 MED ORDER — METOCLOPRAMIDE HCL 5 MG PO TABS: 5.0000 mg | ORAL_TABLET | Freq: Three times a day (TID) | ORAL | Status: DC | PRN

## 2019-06-28 MED ORDER — ACETAMINOPHEN 500 MG PO TABS
1000.0000 mg | ORAL_TABLET | Freq: Four times a day (QID) | ORAL | Status: AC
  Administered 2019-06-28 – 2019-06-29 (×4): 1000 mg via ORAL
  Filled 2019-06-28 (×4): qty 2

## 2019-06-28 MED ORDER — POVIDONE-IODINE 10 % EX SWAB
2.0000 "application " | Freq: Once | CUTANEOUS | Status: AC
  Administered 2019-06-28: 2 via TOPICAL

## 2019-06-28 MED ORDER — CEFAZOLIN SODIUM-DEXTROSE 2-4 GM/100ML-% IV SOLN
2.0000 g | INTRAVENOUS | Status: AC
  Administered 2019-06-28: 2 g via INTRAVENOUS
  Filled 2019-06-28: qty 100

## 2019-06-28 MED ORDER — PROPOFOL 1000 MG/100ML IV EMUL
INTRAVENOUS | Status: AC
  Filled 2019-06-28: qty 100

## 2019-06-28 MED ORDER — DEXAMETHASONE SODIUM PHOSPHATE 10 MG/ML IJ SOLN
8.0000 mg | Freq: Once | INTRAMUSCULAR | Status: AC
  Administered 2019-06-28: 8 mg via INTRAVENOUS

## 2019-06-28 MED ORDER — DOCUSATE SODIUM 100 MG PO CAPS: 100.0000 mg | ORAL_CAPSULE | Freq: Two times a day (BID) | ORAL | Status: DC

## 2019-06-28 MED ORDER — POLYETHYLENE GLYCOL 3350 17 G PO PACK: 17.0000 g | PACK | Freq: Every day | ORAL | Status: DC | PRN

## 2019-06-28 MED ORDER — FENTANYL CITRATE (PF) 100 MCG/2ML IJ SOLN
25.0000 ug | INTRAMUSCULAR | Status: DC | PRN
  Administered 2019-06-28 (×3): 50 ug via INTRAVENOUS

## 2019-06-28 MED ORDER — ORAL CARE MOUTH RINSE: 15.0000 mL | Freq: Once | OROMUCOSAL | Status: AC

## 2019-06-28 MED ORDER — ACETAMINOPHEN 10 MG/ML IV SOLN
1000.0000 mg | Freq: Four times a day (QID) | INTRAVENOUS | Status: DC
  Administered 2019-06-28: 1000 mg via INTRAVENOUS
  Filled 2019-06-28: qty 100

## 2019-06-28 MED ORDER — DEXAMETHASONE SODIUM PHOSPHATE 10 MG/ML IJ SOLN
INTRAMUSCULAR | Status: AC
  Filled 2019-06-28: qty 1

## 2019-06-28 MED ORDER — ACETAMINOPHEN 500 MG PO TABS: 1000.0000 mg | ORAL_TABLET | Freq: Once | ORAL | Status: DC

## 2019-06-28 MED ORDER — METHOCARBAMOL 500 MG IVPB - SIMPLE MED
INTRAVENOUS | Status: AC
  Filled 2019-06-28: qty 50

## 2019-06-28 MED ORDER — NITROGLYCERIN 0.4 MG SL SUBL: 0.4000 mg | SUBLINGUAL_TABLET | SUBLINGUAL | Status: DC | PRN

## 2019-06-28 MED ORDER — OXYCODONE HCL 5 MG PO TABS: 5.0000 mg | ORAL_TABLET | Freq: Once | ORAL | Status: DC | PRN

## 2019-06-28 MED ORDER — PHENYLEPHRINE HCL (PRESSORS) 10 MG/ML IV SOLN
INTRAVENOUS | Status: AC
  Filled 2019-06-28: qty 2

## 2019-06-28 MED ORDER — PHENYLEPHRINE 40 MCG/ML (10ML) SYRINGE FOR IV PUSH (FOR BLOOD PRESSURE SUPPORT)
PREFILLED_SYRINGE | INTRAVENOUS | Status: DC | PRN
  Administered 2019-06-28 (×3): 80 ug via INTRAVENOUS

## 2019-06-28 MED ORDER — SODIUM CHLORIDE (PF) 0.9 % IJ SOLN
INTRAMUSCULAR | Status: AC
  Filled 2019-06-28: qty 10

## 2019-06-28 MED ORDER — METHOCARBAMOL 500 MG IVPB - SIMPLE MED
500.0000 mg | Freq: Four times a day (QID) | INTRAVENOUS | Status: DC | PRN
  Administered 2019-06-28: 500 mg via INTRAVENOUS
  Filled 2019-06-28: qty 50

## 2019-06-28 MED ORDER — TRANEXAMIC ACID-NACL 1000-0.7 MG/100ML-% IV SOLN
1000.0000 mg | INTRAVENOUS | Status: AC
  Administered 2019-06-28: 1000 mg via INTRAVENOUS
  Filled 2019-06-28: qty 100

## 2019-06-28 MED ORDER — FLEET ENEMA 7-19 GM/118ML RE ENEM: 1.0000 | ENEMA | Freq: Once | RECTAL | Status: DC | PRN

## 2019-06-28 MED ORDER — MENTHOL 3 MG MT LOZG: 1.0000 | LOZENGE | OROMUCOSAL | Status: DC | PRN

## 2019-06-28 MED ORDER — SODIUM CHLORIDE 0.9 % IR SOLN
Status: DC | PRN
  Administered 2019-06-28: 1000 mL

## 2019-06-28 MED ORDER — BUPIVACAINE LIPOSOME 1.3 % IJ SUSP
INTRAMUSCULAR | Status: DC | PRN
  Administered 2019-06-28: 20 mL

## 2019-06-28 MED ORDER — METHOCARBAMOL 500 MG PO TABS
500.0000 mg | ORAL_TABLET | Freq: Four times a day (QID) | ORAL | Status: DC | PRN
  Administered 2019-06-28 – 2019-06-29 (×4): 500 mg via ORAL
  Filled 2019-06-28 (×4): qty 1

## 2019-06-28 MED ORDER — HYDROMORPHONE HCL 1 MG/ML IJ SOLN
0.5000 mg | INTRAMUSCULAR | Status: DC | PRN
  Administered 2019-06-28 – 2019-06-29 (×2): 1 mg via INTRAVENOUS
  Filled 2019-06-28 (×2): qty 1

## 2019-06-28 MED ORDER — CHLORHEXIDINE GLUCONATE 0.12 % MT SOLN
15.0000 mL | Freq: Once | OROMUCOSAL | Status: AC
  Administered 2019-06-28: 15 mL via OROMUCOSAL

## 2019-06-28 MED ORDER — CHLORHEXIDINE GLUCONATE 4 % EX LIQD: 60.0000 mL | Freq: Once | CUTANEOUS | Status: DC

## 2019-06-28 SURGICAL SUPPLY — 50 items
ATTUNE MED DOME PAT 38 KNEE (Knees) ×1 IMPLANT
ATTUNE PS FEM RT SZ 5 CEM KNEE (Femur) ×1 IMPLANT
ATTUNE PSRP INSR SZ5 8 KNEE (Insert) ×1 IMPLANT
BAG ZIPLOCK 12X15 (MISCELLANEOUS) ×2 IMPLANT
BASEPLATE TIBIAL ROTATING SZ 4 (Knees) ×1 IMPLANT
BLADE SAG 18X100X1.27 (BLADE) ×2 IMPLANT
BLADE SAW SGTL 11.0X1.19X90.0M (BLADE) ×2 IMPLANT
BNDG ELASTIC 6X5.8 VLCR STR LF (GAUZE/BANDAGES/DRESSINGS) ×2 IMPLANT
BOWL SMART MIX CTS (DISPOSABLE) ×2 IMPLANT
CEMENT HV SMART SET (Cement) ×4 IMPLANT
COVER SURGICAL LIGHT HANDLE (MISCELLANEOUS) ×2 IMPLANT
COVER WAND RF STERILE (DRAPES) IMPLANT
CUFF TOURN SGL QUICK 34 (TOURNIQUET CUFF) ×2
CUFF TRNQT CYL 34X4.125X (TOURNIQUET CUFF) ×1 IMPLANT
DECANTER SPIKE VIAL GLASS SM (MISCELLANEOUS) ×3 IMPLANT
DRAPE U-SHAPE 47X51 STRL (DRAPES) ×2 IMPLANT
DRSG AQUACEL AG ADV 3.5X10 (GAUZE/BANDAGES/DRESSINGS) ×2 IMPLANT
DURAPREP 26ML APPLICATOR (WOUND CARE) ×2 IMPLANT
ELECT REM PT RETURN 15FT ADLT (MISCELLANEOUS) ×2 IMPLANT
GLOVE BIO SURGEON STRL SZ7 (GLOVE) ×2 IMPLANT
GLOVE BIO SURGEON STRL SZ8 (GLOVE) ×2 IMPLANT
GLOVE BIOGEL PI IND STRL 7.0 (GLOVE) ×1 IMPLANT
GLOVE BIOGEL PI IND STRL 8 (GLOVE) ×1 IMPLANT
GLOVE BIOGEL PI INDICATOR 7.0 (GLOVE) ×1
GLOVE BIOGEL PI INDICATOR 8 (GLOVE) ×1
GOWN STRL REUS W/TWL LRG LVL3 (GOWN DISPOSABLE) ×4 IMPLANT
HANDPIECE INTERPULSE COAX TIP (DISPOSABLE) ×2
HOLDER FOLEY CATH W/STRAP (MISCELLANEOUS) ×1 IMPLANT
IMMOBILIZER KNEE 20 (SOFTGOODS) ×2
IMMOBILIZER KNEE 20 THIGH 36 (SOFTGOODS) ×1 IMPLANT
KIT TURNOVER KIT A (KITS) ×1 IMPLANT
MANIFOLD NEPTUNE II (INSTRUMENTS) ×2 IMPLANT
NS IRRIG 1000ML POUR BTL (IV SOLUTION) ×2 IMPLANT
PACK TOTAL KNEE CUSTOM (KITS) ×2 IMPLANT
PADDING CAST COTTON 6X4 STRL (CAST SUPPLIES) ×3 IMPLANT
PENCIL SMOKE EVACUATOR (MISCELLANEOUS) ×1 IMPLANT
PIN DRILL FIX HALF THREAD (BIT) ×1 IMPLANT
PIN STEINMAN FIXATION KNEE (PIN) ×1 IMPLANT
PROTECTOR NERVE ULNAR (MISCELLANEOUS) ×2 IMPLANT
SET HNDPC FAN SPRY TIP SCT (DISPOSABLE) ×1 IMPLANT
STRIP CLOSURE SKIN 1/2X4 (GAUZE/BANDAGES/DRESSINGS) ×4 IMPLANT
SUT MNCRL AB 4-0 PS2 18 (SUTURE) ×2 IMPLANT
SUT STRATAFIX 0 PDS 27 VIOLET (SUTURE) ×2
SUT VIC AB 2-0 CT1 27 (SUTURE) ×6
SUT VIC AB 2-0 CT1 TAPERPNT 27 (SUTURE) ×3 IMPLANT
SUTURE STRATFX 0 PDS 27 VIOLET (SUTURE) ×1 IMPLANT
TRAY FOLEY MTR SLVR 14FR STAT (SET/KITS/TRAYS/PACK) ×1 IMPLANT
WATER STERILE IRR 1000ML POUR (IV SOLUTION) ×4 IMPLANT
WRAP KNEE MAXI GEL POST OP (GAUZE/BANDAGES/DRESSINGS) ×2 IMPLANT
YANKAUER SUCT BULB TIP 10FT TU (MISCELLANEOUS) ×2 IMPLANT

## 2019-06-28 NOTE — Interval H&P Note (Signed)
History and Physical Interval Note:  06/28/2019 6:47 AM  [name]  has presented today for surgery, with the diagnosis of right knee osteoarthritis.  The various methods of treatment have been discussed with the patient and family. After consideration of risks, benefits and other options for treatment, the patient has consented to  Procedure(s) with comments: TOTAL KNEE ARTHROPLASTY (Right) - 69min as a surgical intervention.  The patient's history has been reviewed, patient examined, no change in status, stable for surgery.  I have reviewed the patient's chart and labs.  Questions were answered to the patient's satisfaction.     Pilar Plate Fielding Mault

## 2019-06-28 NOTE — Progress Notes (Signed)
Orthopedic Tech Progress Note Patient Details:  [name] [birth year] [id number]  CPM Right Knee CPM Right Knee: Off Right Knee Flexion (Degrees): 40 Right Knee Extension (Degrees): 10 Additional Comments: Trapeze bar  Post Interventions Patient Tolerated: Well Instructions Provided: Care of device  [place] Pink 06/28/2019, 1:20 PM

## 2019-06-28 NOTE — Evaluation (Signed)
Physical Therapy Evaluation Patient Details Name: [name] MRN: [id number] DOB: [birth year] Today's Date: 06/28/2019   History of Present Illness  s/p R TKA. PMH: L TKA 11/30/18, HTN, DM, gastric bypass, CAD, OA, L TSA, PMR  Clinical Impression  Pt is s/p TKA resulting in the deficits listed below (see PT Problem List).  Pt motivated to be OOB, amb ~ 20' with RW and min assist, limited by pain. Anticipate steady progress in acute setting.   Pt will benefit from skilled PT to increase their independence and safety with mobility to allow discharge to the venue listed below.      Follow Up Recommendations Follow surgeon's recommendation for DC plan and follow-up therapies    Equipment Recommendations  None recommended by PT    Recommendations for Other Services       Precautions / Restrictions Precautions Precautions: Fall;Knee Required Braces or Orthoses: Knee Immobilizer - Right Restrictions Weight Bearing Restrictions: No Other Position/Activity Restrictions: WBAT      Mobility  Bed Mobility Overal bed mobility: Needs Assistance Bed Mobility: Supine to Sit     Supine to sit: Min guard     General bed mobility comments: for safety, incr time  Transfers Overall transfer level: Needs assistance Equipment used: Rolling walker (2 wheeled) Transfers: Sit to/from Stand Sit to Stand: Min assist         General transfer comment: cues for hand placement  Ambulation/Gait Ambulation/Gait assistance: Min guard;Min assist Gait Distance (Feet): 20 Feet Assistive device: Rolling walker (2 wheeled) Gait Pattern/deviations: Step-to pattern;Decreased stance time - right     General Gait Details: cues for sequence, RW position  Stairs            Wheelchair Mobility    Modified Rankin (Stroke Patients Only)       Balance                                             Pertinent Vitals/Pain Pain Assessment: 0-10 Pain Score: 8  Pain  Location: right knee Pain Descriptors / Indicators: Aching;Grimacing;Sore Pain Intervention(s): Limited activity within patient's tolerance;Monitored during session;Premedicated before session;Repositioned    Home Living Family/patient expects to be discharged to:: Private residence Living Arrangements: Alone Available Help at Discharge: Family;Friend(s);Available 24 hours/day Type of Home: House Home Access: Stairs to enter   [employer] of Steps: 1 threshold into laundry room and then 3 steps with Rt hand rail going up; 7 at front with 2 hand rails Home Layout: One level Home Equipment: [place] - 2 wheels;Shower seat;Grab bars - tub/shower;Cane - single point;Bedside commode Additional Comments: pt's daughter  will stay    Prior Function Level of Independence: Independent               Hand Dominance        Extremity/Trunk Assessment   Upper Extremity Assessment Upper Extremity Assessment: Overall WFL for tasks assessed    Lower Extremity Assessment Lower Extremity Assessment: RLE deficits/detail RLE Deficits / Details: hip flexion and knee extension grossly 2+ to 3/5, limited by post op pain. knee flexion ~5 to 65 degrees       Communication   Communication: No difficulties  Cognition Arousal/Alertness: Awake/alert Behavior During Therapy: WFL for tasks assessed/performed Overall Cognitive Status: Within Functional Limits for tasks assessed  General Comments      Exercises Total Joint Exercises Ankle Circles/Pumps: AROM;Both;10 reps Quad Sets: AROM;Both;5 reps Heel Slides: AAROM;Right;5 reps   Assessment/Plan    PT Assessment    PT Problem List         PT Treatment Interventions      PT Goals (Current goals can be found in the Care Plan section)  Acute Rehab PT Goals Patient Stated Goal: decr pain PT Goal Formulation: With patient Time For Goal Achievement: 07/05/19 Potential to  Achieve Goals: Good    Frequency     Barriers to discharge        Co-evaluation               AM-PAC PT "6 Clicks" Mobility  Outcome Measure Help needed turning from your back to your side while in a flat bed without using bedrails?: A Little Help needed moving from lying on your back to sitting on the side of a flat bed without using bedrails?: A Little Help needed moving to and from a bed to a chair (including a wheelchair)?: A Little Help needed standing up from a chair using your arms (e.g., wheelchair or bedside chair)?: A Little Help needed to walk in hospital room?: A Little Help needed climbing 3-5 steps with a railing? : A Lot 6 Click Score: 17    End of Session Equipment Utilized During Treatment: Gait belt Activity Tolerance: Patient tolerated treatment well;Patient limited by pain Patient left: in chair;with call bell/phone within reach;with chair alarm set   PT Visit Diagnosis: Difficulty in walking, not elsewhere classified (R26.2)    Time: 8527-7824 PT Time Calculation (min) (ACUTE ONLY): 16 min   Charges:   PT Evaluation $PT Eval Low Complexity: [place], PT   Acute Rehab Dept [facility]): [phone]   06/28/2019   [facility] 06/28/2019, 1:50 PM

## 2019-06-28 NOTE — Anesthesia Postprocedure Evaluation (Signed)
Anesthesia Post Note  Patient: [name]  Procedure(s) Performed: TOTAL KNEE ARTHROPLASTY (Right Knee)     Patient location during evaluation: PACU Anesthesia Type: Spinal Level of consciousness: awake Pain management: pain level controlled Vital Signs Assessment: post-procedure vital signs reviewed and stable Respiratory status: spontaneous breathing Cardiovascular status: stable and bradycardic Postop Assessment: no apparent nausea or vomiting Anesthetic complications: no    Last Vitals:  Vitals:   06/28/19 0945 06/28/19 1007  BP: 112/68 (!) 119/92  Pulse: (!) 55 60  Resp: 17 16  Temp: 36.4 C 36.4 C  SpO2: 97% 96%    Last Pain:  Vitals:   06/28/19 0945  TempSrc:   PainSc: 4    Pain Goal: Patients Stated Pain Goal: 4 (06/28/19 0935)          L Sensory Level: L5-Outer lower leg, top of foot, great toe (06/28/19 0945) R Sensory Level: L5-Outer lower leg, top of foot, great toe (06/28/19 0945) Epidural/Spinal Function Cutaneous sensation: Able to Wiggle Toes (06/28/19 0945), Patient able to flex knees: Yes (06/28/19 0945), Patient able to lift hips off bed: No (06/28/19 0945)  Huston Foley

## 2019-06-28 NOTE — Anesthesia Procedure Notes (Signed)
Anesthesia Regional Block: Adductor canal block   Pre-Anesthetic Checklist: ,, timeout performed, Correct Patient, Correct Site, Correct Laterality, Correct Procedure, Correct Position, site marked, Risks and benefits discussed,  Surgical consent,  Pre-op evaluation,  At surgeon's request and post-op pain management  Laterality: Lower and Right  Prep: chloraprep       Needles:  Injection technique: Single-shot  Needle Type: Echogenic Stimulator Needle     Needle Length: 9cm  Needle Gauge: 20   Needle insertion depth: 3 cm   Additional Needles:   Procedures:,,,, ultrasound used (permanent image in chart),,,,  Narrative:  Start time: 06/28/2019 7:00 AM End time: 06/28/2019 7:08 AM Injection made incrementally with aspirations every 5 mL. Anesthesiologist: Lyn Hollingshead, MD

## 2019-06-28 NOTE — Progress Notes (Signed)
Orthopedic Tech Progress Note Patient Details:  [name] Dec 03, 1940 [id number]  CPM Right Knee CPM Right Knee: On Right Knee Flexion (Degrees): 40 Right Knee Extension (Degrees): 10 Additional Comments: Trapeze bar  Post Interventions Patient Tolerated: Well Instructions Provided: Care of device  [place] Pink 06/28/2019, 8:59 AM

## 2019-06-28 NOTE — Transfer of Care (Signed)
Immediate Anesthesia Transfer of Care Note  Patient: [name]  Procedure(s) Performed: TOTAL KNEE ARTHROPLASTY (Right Knee)  Patient Location: PACU  Anesthesia Type:MAC and Spinal  Level of Consciousness: awake, alert , oriented and patient cooperative  Airway & Oxygen Therapy: Patient Spontanous Breathing and Patient connected to face mask oxygen  Post-op Assessment: Report given to RN and Post -op Vital signs reviewed and stable  Post vital signs: Reviewed and stable  Last Vitals:  Vitals Value Taken Time  BP 109/64 06/28/19 0850  Temp    Pulse 57 06/28/19 0853  Resp 12 06/28/19 0853  SpO2 100 % 06/28/19 0853  Vitals shown include unvalidated device data.  Last Pain:  Vitals:   06/28/19 0550  TempSrc:   PainSc: 5       Patients Stated Pain Goal: 4 (53/20/23 3435)  Complications: No apparent anesthesia complications

## 2019-06-28 NOTE — Anesthesia Procedure Notes (Signed)
Spinal  Patient location during procedure: OR Start time: 06/28/2019 7:24 AM End time: 06/28/2019 7:28 AM Staffing Performed: anesthesiologist  Anesthesiologist: Lyn Hollingshead, MD Preanesthetic Checklist Completed: patient identified, IV checked, site marked, risks and benefits discussed, surgical consent, monitors and equipment checked, pre-op evaluation and timeout performed Spinal Block Patient position: sitting Prep: DuraPrep and site prepped and draped Patient monitoring: continuous pulse ox and blood pressure Approach: midline Location: L3-4 Injection technique: single-shot Needle Needle type: Pencan  Needle gauge: 24 G Needle length: 10 cm Needle insertion depth: 6 cm Assessment Sensory level: T8

## 2019-06-28 NOTE — Discharge Instructions (Addendum)
 Frank Aluisio, MD Total Joint Specialist [facility] [place] [address] [place], [place] [address] [phone]  TOTAL KNEE REPLACEMENT POSTOPERATIVE DIRECTIONS    Knee Rehabilitation, Guidelines Following Surgery  Results after knee surgery are often greatly improved when you follow the exercise, range of motion and muscle strengthening exercises prescribed by your doctor. Safety measures are also important to protect the knee from further injury. If any of these exercises cause you to have increased pain or swelling in your knee joint, decrease the amount until you are comfortable again and slowly increase them. If you have problems or questions, call your caregiver or physical therapist for advice.   BLOOD CLOT PREVENTION . Take a 325 mg Aspirin two times a day for three weeks following surgery. Then resume one 81 mg Aspirin once a day. . You may resume your vitamins/supplements upon discharge from the hospital. . Do not take any NSAIDs (Advil, Aleve, Ibuprofen, Meloxicam, etc.) until you have discontinued the 325 mg Aspirin.  HOME CARE INSTRUCTIONS  . Remove items at home which could result in a fall. This includes throw rugs or furniture in walking pathways.  . ICE to the affected knee as much as tolerated. Icing helps control swelling. If the swelling is well controlled you will be more comfortable and rehab easier. Continue to use ice on the knee for pain and swelling from surgery. You may notice swelling that will progress down to the foot and ankle. This is normal after surgery. Elevate the leg when you are not up walking on it.    . Continue to use the breathing machine which will help keep your temperature down. It is common for your temperature to cycle up and down following surgery, especially at night when you are not up moving around and exerting yourself. The breathing machine keeps your lungs expanded and your temperature down. . Do not place pillow  under the operative knee, focus on keeping the knee straight while resting  DIET You may resume your previous home diet once you are discharged from the hospital.  DRESSING / WOUND CARE / SHOWERING . Keep your bulky bandage on for 2 days. On the third post-operative day you may remove the Ace bandage and gauze. There is a waterproof adhesive bandage on your skin which will stay in place until your first follow-up appointment. Once you remove this you will not need to place another bandage . You may begin showering 3 days following surgery, but do not submerge the incision under water.  ACTIVITY For the first 5 days, the key is rest and control of pain and swelling . Do your home exercises twice a day starting on post-operative day 3. On the days you go to physical therapy, just do the home exercises once that day. . You should rest, ice and elevate the leg for 50 minutes out of every hour. Get up and walk/stretch for 10 minutes per hour. After 5 days you can increase your activity slowly as tolerated. . Walk with your walker as instructed. Use the walker until you are comfortable transitioning to a cane. Walk with the cane in the opposite hand of the operative leg. You may discontinue the cane once you are comfortable and walking steadily. . Avoid periods of inactivity such as sitting longer than an hour when not asleep. This helps prevent blood clots.  . You may discontinue the knee immobilizer once you are able to perform a straight leg raise while lying down. .   You may resume a sexual relationship in one month or when given the OK by your doctor.  . You may return to work once you are cleared by your doctor.  . Do not drive a car for 6 weeks or until released by your surgeon.  . Do not drive while taking narcotics.  TED HOSE STOCKINGS Wear the elastic stockings on both legs for three weeks following surgery during the day. You may remove them at night for sleeping.  WEIGHT BEARING Weight  bearing as tolerated with assist device (walker, cane, etc) as directed, use it as long as suggested by your surgeon or therapist, typically at least 4-6 weeks.  POSTOPERATIVE CONSTIPATION PROTOCOL Constipation - defined medically as fewer than three stools per week and severe constipation as less than one stool per week.  One of the most common issues patients have following surgery is constipation.  Even if you have a regular bowel pattern at home, your normal regimen is likely to be disrupted due to multiple reasons following surgery.  Combination of anesthesia, postoperative narcotics, change in appetite and fluid intake all can affect your bowels.  In order to avoid complications following surgery, here are some recommendations in order to help you during your recovery period.  . Colace (docusate) - Pick up an over-the-counter form of Colace or another stool softener and take twice a day as long as you are requiring postoperative pain medications.  Take with a full glass of water daily.  If you experience loose stools or diarrhea, hold the colace until you stool forms back up. If your symptoms do not get better within 1 week or if they get worse, check with your doctor. . Dulcolax (bisacodyl) - Pick up over-the-counter and take as directed by the product packaging as needed to assist with the movement of your bowels.  Take with a full glass of water.  Use this product as needed if not relieved by Colace only.  . MiraLax (polyethylene glycol) - Pick up over-the-counter to have on hand. MiraLax is a solution that will increase the amount of water in your bowels to assist with bowel movements.  Take as directed and can mix with a glass of water, juice, soda, coffee, or tea. Take if you go more than two days without a movement. Do not use MiraLax more than once per day. Call your doctor if you are still constipated or irregular after using this medication for 7 days in a row.  If you continue to have  problems with postoperative constipation, please contact the office for further assistance and recommendations.  If you experience "the worst abdominal pain ever" or develop nausea or vomiting, please contact the office immediatly for further recommendations for treatment.  ITCHING If you experience itching with your medications, try taking only a single pain pill, or even half a pain pill at a time.  You can also use Benadryl over the counter for itching or also to help with sleep.   MEDICATIONS See your medication summary on the "After Visit Summary" that the nursing staff will review with you prior to discharge.  You may have some home medications which will be placed on hold until you complete the course of blood thinner medication.  It is important for you to complete the blood thinner medication as prescribed by your surgeon.  Continue your approved medications as instructed at time of discharge.  PRECAUTIONS . If you experience chest pain or shortness of breath - call 911 immediately for   transfer to the hospital emergency department.  . If you develop a fever greater that 101 F, purulent drainage from wound, increased redness or drainage from wound, foul odor from the wound/dressing, or calf pain - CONTACT YOUR SURGEON.                                                   FOLLOW-UP APPOINTMENTS Make sure you keep all of your appointments after your operation with your surgeon and caregivers. You should call the office at the above phone number and make an appointment for approximately two weeks after the date of your surgery or on the date instructed by your surgeon outlined in the "After Visit Summary".  RANGE OF MOTION AND STRENGTHENING EXERCISES  Rehabilitation of the knee is important following a knee injury or an operation. After just a few days of immobilization, the muscles of the thigh which control the knee become weakened and shrink (atrophy). Knee exercises are designed to build up the  tone and strength of the thigh muscles and to improve knee motion. Often times heat used for twenty to thirty minutes before working out will loosen up your tissues and help with improving the range of motion but do not use heat for the first two weeks following surgery. These exercises can be done on a training (exercise) mat, on the floor, on a table or on a bed. Use what ever works the best and is most comfortable for you Knee exercises include:  . Leg Lifts - While your knee is still immobilized in a splint or cast, you can do straight leg raises. Lift the leg to 60 degrees, hold for 3 sec, and slowly lower the leg. Repeat 10-20 times 2-3 times daily. Perform this exercise against resistance later as your knee gets better.  . Quad and Hamstring Sets - Tighten up the muscle on the front of the thigh (Quad) and hold for 5-10 sec. Repeat this 10-20 times hourly. Hamstring sets are done by pushing the foot backward against an object and holding for 5-10 sec. Repeat as with quad sets.   Leg Slides: Lying on your back, slowly slide your foot toward your buttocks, bending your knee up off the floor (only go as far as is comfortable). Then slowly slide your foot back down until your leg is flat on the floor again.  Angel Wings: Lying on your back spread your legs to the side as far apart as you can without causing discomfort.  A rehabilitation program following serious knee injuries can speed recovery and prevent re-injury in the future due to weakened muscles. Contact your doctor or a physical therapist for more information on knee rehabilitation.   IF YOU ARE TRANSFERRED TO A SKILLED REHAB FACILITY If the patient is transferred to a skilled rehab facility following release from the hospital, a list of the current medications will be sent to the facility for the patient to continue.  When discharged from the skilled rehab facility, please have the facility set up the patient's Home Health Physical Therapy  prior to being released. Also, the skilled facility will be responsible for providing the patient with their medications at time of release from the facility to include their pain medication, the muscle relaxants, and their blood thinner medication. If the patient is still at the rehab facility at time of the   two week follow up appointment, the skilled rehab facility will also need to assist the patient in arranging follow up appointment in our office and any transportation needs.  MAKE SURE YOU:  . Understand these instructions.  . Get help right away if you are not doing well or get worse.   DENTAL ANTIBIOTICS:  In most cases prophylactic antibiotics for Dental procdeures after total joint surgery are not necessary.  Exceptions are as follows:  1. History of prior total joint infection  2. Severely immunocompromised (Organ Transplant, cancer chemotherapy, Rheumatoid biologic meds such as Humera)  3. Poorly controlled diabetes (A1C &gt; 8.0, blood glucose over 200)  If you have one of these conditions, contact your surgeon for an antibiotic prescription, prior to your dental procedure.    Pick up stool softner and laxative for home use following surgery while on pain medications. Do not submerge incision under water. Please use good hand washing techniques while changing dressing each day. May shower starting three days after surgery. Please use a clean towel to pat the incision dry following showers. Continue to use ice for pain and swelling after surgery. Do not use any lotions or creams on the incision until instructed by your surgeon.  

## 2019-06-28 NOTE — Op Note (Signed)
OPERATIVE REPORT-TOTAL KNEE ARTHROPLASTY   Pre-operative diagnosis- Osteoarthritis  Right knee(s)  Post-operative diagnosis- Osteoarthritis Right knee(s)  Procedure-  Right  Total Knee Arthroplasty  Surgeon- Dione Plover. Rasha Ibe, MD  Assistant- Molli Barrows, PA-C   Anesthesia-  Adductor canal block and spinal  EBL-50 mL   Drains Hemovac  Tourniquet time-  Total Tourniquet Time Documented: Thigh (Right) - 31 minutes Total: Thigh (Right) - 31 minutes     Complications- None  Condition-PACU - hemodynamically stable.   Brief Clinical Note  [name] is a 79 y.o. year old female with end stage OA of her right knee with progressively worsening pain and dysfunction. She has constant pain, with activity and at rest and significant functional deficits with difficulties even with ADLs. She has had extensive non-op management including analgesics, injections of cortisone and viscosupplements, and home exercise program, but remains in significant pain with significant dysfunction.Radiographs show bone on bone arthritis medial and patellofemoral. She presents now for right Total Knee Arthroplasty.    Procedure in detail---   The patient is brought into the operating room and positioned supine on the operating table. After successful administration of  Adductor canal block and spinal,   a tourniquet is placed high on the  Right thigh(s) and the lower extremity is prepped and draped in the usual sterile fashion. Time out is performed by the operating team and then the  Right lower extremity is wrapped in Esmarch, knee flexed and the tourniquet inflated to 300 mmHg.       A midline incision is made with a ten blade through the subcutaneous tissue to the level of the extensor mechanism. A fresh blade is used to make a medial parapatellar arthrotomy. Soft tissue over the proximal medial tibia is subperiosteally elevated to the joint line with a knife and into the semimembranosus bursa with a  Cobb elevator. Soft tissue over the proximal lateral tibia is elevated with attention being paid to avoiding the patellar tendon on the tibial tubercle. The patella is everted, knee flexed 90 degrees and the ACL and PCL are removed. Findings are bone on bone medial and patellofemoral with large global osteophytes.        The drill is used to create a starting hole in the distal femur and the canal is thoroughly irrigated with sterile saline to remove the fatty contents. The 5 degree Right  valgus alignment guide is placed into the femoral canal and the distal femoral cutting block is pinned to remove 9 mm off the distal femur. Resection is made with an oscillating saw.      The tibia is subluxed forward and the menisci are removed. The extramedullary alignment guide is placed referencing proximally at the medial aspect of the tibial tubercle and distally along the second metatarsal axis and tibial crest. The block is pinned to remove 36mm off the more deficient medial  side. Resection is made with an oscillating saw. Size 4is the most appropriate size for the tibia and the proximal tibia is prepared with the modular drill and keel punch for that size.      The femoral sizing guide is placed and size 5 is most appropriate. Rotation is marked off the epicondylar axis and confirmed by creating a rectangular flexion gap at 90 degrees. The size 5 cutting block is pinned in this rotation and the anterior, posterior and chamfer cuts are made with the oscillating saw. The intercondylar block is then placed and that cut is made.  Trial size 4 tibial component, trial size 5 posterior stabilized femur and a 8  mm posterior stabilized rotating platform insert trial is placed. Full extension is achieved with excellent varus/valgus and anterior/posterior balance throughout full range of motion. The patella is everted and thickness measured to be 22  mm. Free hand resection is taken to 12 mm, a 38 template is placed, lug  holes are drilled, trial patella is placed, and it tracks normally. Osteophytes are removed off the posterior femur with the trial in place. All trials are removed and the cut bone surfaces prepared with pulsatile lavage. Cement is mixed and once ready for implantation, the size 4 tibial implant, size  5 posterior stabilized femoral component, and the size 38 patella are cemented in place and the patella is held with the clamp. The trial insert is placed and the knee held in full extension. The Exparel (20 ml mixed with 60 ml saline) is injected into the extensor mechanism, posterior capsule, medial and lateral gutters and subcutaneous tissues.  All extruded cement is removed and once the cement is hard the permanent 8 mm posterior stabilized rotating platform insert is placed into the tibial tray.      The wound is copiously irrigated with saline solution and the extensor mechanism closed over a hemovac drain with #1 V-loc suture. The tourniquet is released for a total tourniquet time of 31  minutes. Flexion against gravity is 140 degrees and the patella tracks normally. Subcutaneous tissue is closed with 2.0 vicryl and subcuticular with running 4.0 Monocryl. The incision is cleaned and dried and steri-strips and a bulky sterile dressing are applied. The limb is placed into a knee immobilizer and the patient is awakened and transported to recovery in stable condition.      Please note that a surgical assistant was a medical necessity for this procedure in order to perform it in a safe and expeditious manner. Surgical assistant was necessary to retract the ligaments and vital neurovascular structures to prevent injury to them and also necessary for proper positioning of the limb to allow for anatomic placement of the prosthesis.   Dione Plover Jetaun Colbath, MD    06/28/2019, 8:24 AM

## 2019-06-29 ENCOUNTER — Encounter: Payer: Self-pay | Admitting: *Deleted

## 2019-06-29 DIAGNOSIS — M1711 Unilateral primary osteoarthritis, right knee: Secondary | ICD-10-CM | POA: Diagnosis not present

## 2019-06-29 DIAGNOSIS — E7800 Pure hypercholesterolemia, unspecified: Secondary | ICD-10-CM | POA: Diagnosis not present

## 2019-06-29 DIAGNOSIS — I25118 Atherosclerotic heart disease of native coronary artery with other forms of angina pectoris: Secondary | ICD-10-CM | POA: Diagnosis not present

## 2019-06-29 DIAGNOSIS — M25761 Osteophyte, right knee: Secondary | ICD-10-CM | POA: Diagnosis not present

## 2019-06-29 DIAGNOSIS — M238X1 Other internal derangements of right knee: Secondary | ICD-10-CM | POA: Diagnosis not present

## 2019-06-29 DIAGNOSIS — F419 Anxiety disorder, unspecified: Secondary | ICD-10-CM | POA: Diagnosis not present

## 2019-06-29 DIAGNOSIS — I10 Essential (primary) hypertension: Secondary | ICD-10-CM | POA: Diagnosis not present

## 2019-06-29 DIAGNOSIS — M8588 Other specified disorders of bone density and structure, other site: Secondary | ICD-10-CM | POA: Diagnosis not present

## 2019-06-29 LAB — BASIC METABOLIC PANEL
Anion gap: 7 (ref 5–15)
BUN: 14 mg/dL (ref 8–23)
CO2: 27 mmol/L (ref 22–32)
Calcium: 9.5 mg/dL (ref 8.9–10.3)
Chloride: 99 mmol/L (ref 98–111)
Creatinine, Ser: 0.72 mg/dL (ref 0.44–1.00)
GFR calc Af Amer: >60 mL/min (ref >60)
GFR calc non Af Amer: >60 mL/min (ref >60)
Glucose, Bld: 141 mg/dL — ABNORMAL HIGH (ref 70–99)
Potassium: 4.3 mmol/L (ref 3.5–5.1)
Sodium: 133 mmol/L — ABNORMAL LOW (ref 135–145)

## 2019-06-29 LAB — GLUCOSE, CAPILLARY
Glucose-Capillary: 133 mg/dL — ABNORMAL HIGH (ref 70–99)
Glucose-Capillary: 111 mg/dL — ABNORMAL HIGH (ref 70–99)

## 2019-06-29 LAB — CBC
HCT: 34.4 % — ABNORMAL LOW (ref 36.0–46.0)
Hemoglobin: 11.5 g/dL — ABNORMAL LOW (ref 12.0–15.0)
MCH: 30.4 pg (ref 26.0–34.0)
MCHC: 33.4 g/dL (ref 30.0–36.0)
MCV: 91 fL (ref 80.0–100.0)
Platelets: 176 10*3/uL (ref 150–400)
RBC: 3.78 MIL/uL — ABNORMAL LOW (ref 3.87–5.11)
RDW: 13.3 % (ref 11.5–15.5)
WBC: 12.2 10*3/uL — ABNORMAL HIGH (ref 4.0–10.5)
nRBC: 0 % (ref 0.0–0.2)

## 2019-06-29 MED ORDER — METHOCARBAMOL 500 MG PO TABS: 500.0000 mg | ORAL_TABLET | Freq: Four times a day (QID) | ORAL | 0 refills | Status: DC | PRN

## 2019-06-29 MED ORDER — HYDROMORPHONE HCL 2 MG PO TABS: 2.0000 mg | ORAL_TABLET | Freq: Four times a day (QID) | ORAL | 0 refills | Status: DC | PRN

## 2019-06-29 MED ORDER — ASPIRIN 325 MG PO TBEC: 325.0000 mg | DELAYED_RELEASE_TABLET | Freq: Two times a day (BID) | ORAL | 0 refills | Status: AC

## 2019-06-29 NOTE — Care Plan (Signed)
Ortho Bundle Case Management Note  Patient Details  Name: [name] MRN: [id number] Date of Birth: [birth year]                  R TKA on 06-28-19 DCP: Home with dtr. 1 story home with 3 ste. DME: No needs. Borrowing a RW and has a 3-in-1 PT: EO. PT eval to be scheduled on 07-01-19.   DME Arranged:  N/A DME Agency:     HH Arranged:    HH Agency:     Additional Comments: Please contact me with any questions of if this plan should need to change.  Marianne Sofia, RN,CCM [facility]  [phone] 06/29/2019, 9:27 AM

## 2019-06-29 NOTE — Progress Notes (Signed)
Physical Therapy Treatment Patient Details Name: [name] MRN: [id number] DOB: 1940-03-05 Today's Date: 06/29/2019    History of Present Illness s/p R TKA. PMH: L TKA 11/30/18, HTN, DM, gastric bypass, CAD, OA, L TSA, PMR    PT Comments    Pt  Progressing very well. Ready for d/c with family assist from PT standpoint.  Follow Up Recommendations  Follow surgeon's recommendation for DC plan and follow-up therapies     Equipment Recommendations  None recommended by PT    Recommendations for Other Services       Precautions / Restrictions Precautions Precautions: Fall;Knee Precaution Comments: IND SLRs Restrictions Weight Bearing Restrictions: No Other Position/Activity Restrictions: WBAT    Mobility  Bed Mobility Overal bed mobility: Needs Assistance Bed Mobility: Supine to Sit     Supine to sit: Supervision     General bed mobility comments: for safety, incr time  Transfers Overall transfer level: Needs assistance Equipment used: Rolling walker (2 wheeled) Transfers: Sit to/from Stand Sit to Stand: Supervision         General transfer comment: cues for hand placement  Ambulation/Gait Ambulation/Gait assistance: Min guard;Supervision Gait Distance (Feet): 60 Feet Assistive device: Rolling walker (2 wheeled) Gait Pattern/deviations: Step-to pattern;Decreased stance time - right     General Gait Details: cues for sequence, RW position. pt self corrects    Stairs Stairs: Yes Stairs assistance: Min guard Stair Management: One rail Right;Sideways;Forwards;Step to pattern Number of Stairs: 5(x2) General stair comments: cues for technique and sequence; reviewed second time with dtr    Wheelchair Mobility    Modified Rankin (Stroke Patients Only)       Balance Overall balance assessment: Mild deficits observed, not formally tested                                          Cognition Arousal/Alertness: Awake/alert Behavior  During Therapy: WFL for tasks assessed/performed Overall Cognitive Status: Within Functional Limits for tasks assessed                                        Exercises   General Comments        Pertinent Vitals/Pain Pain Assessment: 0-10 Pain Score: 4  Pain Location: right knee Pain Descriptors / Indicators: Aching;Grimacing;Sore Pain Intervention(s): Limited activity within patient's tolerance;Monitored during session;Premedicated before session;Repositioned    Home Living                      Prior Function            PT Goals (current goals can now be found in the care plan section) Acute Rehab PT Goals Patient Stated Goal: decr pain PT Goal Formulation: With patient Time For Goal Achievement: 07/05/19 Potential to Achieve Goals: Good Progress towards PT goals: Progressing toward goals    Frequency    7X/week      PT Plan Current plan remains appropriate    Co-evaluation              AM-PAC PT "6 Clicks" Mobility   Outcome Measure  Help needed turning from your back to your side while in a flat bed without using bedrails?: None Help needed moving from lying on your back to sitting on the side of a flat bed without  using bedrails?: None Help needed moving to and from a bed to a chair (including a wheelchair)?: None Help needed standing up from a chair using your arms (e.g., wheelchair or bedside chair)?: A Little Help needed to walk in hospital room?: A Little Help needed climbing 3-5 steps with a railing? : A Little 6 Click Score: 21    End of Session Equipment Utilized During Treatment: Gait belt Activity Tolerance: Patient tolerated treatment well Patient left: in chair;with call bell/phone within reach;with nursing/sitter in room;with family/visitor present   PT Visit Diagnosis: Difficulty in walking, not elsewhere classified (R26.2)     Time: 5643-3295 PT Time Calculation (min) (ACUTE ONLY): 16 min  Charges:   $Gait Training: 8-22 mins $Therapeutic Exercise: 8-22 mins                     Baxter Flattery, PT   Acute Rehab Dept [facility]): [phone]   06/29/2019    [facility] 06/29/2019, 12:51 PM

## 2019-06-29 NOTE — Progress Notes (Signed)
Physical Therapy Treatment Patient Details Name: [name] MRN: [id number] DOB: [birth year] Today's Date: 06/29/2019    History of Present Illness s/p R TKA. PMH: L TKA 11/30/18, HTN, DM, gastric bypass, CAD, OA, L TSA, PMR    PT Comments    Pt progressing well. Will see in pm for stair training and pt should be ready to d/c later today.   Follow Up Recommendations  Follow surgeon's recommendation for DC plan and follow-up therapies     Equipment Recommendations  None recommended by PT    Recommendations for Other Services       Precautions / Restrictions Precautions Precautions: Fall;Knee Precaution Comments: IND SLRs Restrictions Weight Bearing Restrictions: No Other Position/Activity Restrictions: WBAT    Mobility  Bed Mobility Overal bed mobility: Needs Assistance Bed Mobility: Supine to Sit     Supine to sit: Supervision     General bed mobility comments: for safety, incr time  Transfers Overall transfer level: Needs assistance Equipment used: Rolling walker (2 wheeled) Transfers: Sit to/from Stand Sit to Stand: Min guard;Supervision         General transfer comment: cues for hand placement  Ambulation/Gait Ambulation/Gait assistance: Min guard;Supervision Gait Distance (Feet): 100 Feet Assistive device: Rolling walker (2 wheeled) Gait Pattern/deviations: Step-to pattern;Decreased stance time - right     General Gait Details: cues for sequence, RW position. pt self corrects    Stairs             Wheelchair Mobility    Modified Rankin (Stroke Patients Only)       Balance Overall balance assessment: Mild deficits observed, not formally tested                                          Cognition Arousal/Alertness: Awake/alert Behavior During Therapy: WFL for tasks assessed/performed Overall Cognitive Status: Within Functional Limits for tasks assessed                                         Exercises Total Joint Exercises Ankle Circles/Pumps: AROM;Both;10 reps Quad Sets: AROM;Both;10 reps Short Arc Quad: AROM;AAROM;Right;10 reps Heel Slides: AAROM;Right;10 reps Hip ABduction/ADduction: AROM;Right;AAROM;10 reps Straight Leg Raises: AROM;Right;10 reps Knee Flexion: AROM;Right;5 reps;Seated Goniometric ROM: grossly 5 to 70 degrees right knee flexion    General Comments        Pertinent Vitals/Pain Pain Assessment: 0-10 Pain Score: 5  Pain Location: right knee Pain Descriptors / Indicators: Aching;Grimacing;Sore Pain Intervention(s): Limited activity within patient's tolerance;Monitored during session;Premedicated before session;Repositioned;Ice applied    Home Living                      Prior Function            PT Goals (current goals can now be found in the care plan section) Acute Rehab PT Goals Patient Stated Goal: decr pain PT Goal Formulation: With patient Time For Goal Achievement: 07/05/19 Potential to Achieve Goals: Good Progress towards PT goals: Progressing toward goals    Frequency    7X/week      PT Plan Current plan remains appropriate    Co-evaluation              AM-PAC PT "6 Clicks" Mobility   Outcome Measure  Help needed turning from your  back to your side while in a flat bed without using bedrails?: None Help needed moving from lying on your back to sitting on the side of a flat bed without using bedrails?: None Help needed moving to and from a bed to a chair (including a wheelchair)?: None Help needed standing up from a chair using your arms (e.g., wheelchair or bedside chair)?: A Little Help needed to walk in hospital room?: A Little Help needed climbing 3-5 steps with a railing? : A Little 6 Click Score: 21    End of Session Equipment Utilized During Treatment: Gait belt Activity Tolerance: Patient tolerated treatment well Patient left: in chair;with call bell/phone within reach;with nursing/sitter in  room;with family/visitor present   PT Visit Diagnosis: Difficulty in walking, not elsewhere classified (R26.2)     Time: 2119-4174 PT Time Calculation (min) (ACUTE ONLY): 27 min  Charges:  $Gait Training: 8-22 mins $Therapeutic Exercise: 8-22 mins                     Baxter Flattery, PT   Acute Rehab Dept [facility]): [phone]   06/29/2019    [facility] 06/29/2019, 10:25 AM

## 2019-06-29 NOTE — Progress Notes (Signed)
Subjective: 1 Day Post-Op Procedure(s) (LRB): TOTAL KNEE ARTHROPLASTY (Right) Patient reports pain as mild.   Patient seen in rounds by Dr. Wynelle Link. Patient is well, and has had no acute complaints or problems other than pain in the right knee. Denies chest pain, SOB, or calf pain. No issues overnight. Foley catheter to be removed this AM.  We will continue therapy today.   Objective: Vital signs in last 24 hours: Temp:  [96.4 F (35.8 C)-98.2 F (36.8 C)] 97.8 F (36.6 C) (06/08 0637) Pulse Rate:  [54-70] 57 (06/08 0637) Resp:  [12-19] 14 (06/08 0637) BP: (109-147)/(59-92) 147/75 (06/08 0637) SpO2:  [95 %-100 %] 100 % (06/08 0637)  Intake/Output from previous day:  Intake/Output Summary (Last 24 hours) at 06/29/2019 0736 Last data filed at 06/29/2019 0615 Gross per 24 hour  Intake 4766.61 ml  Output 6800 ml  Net -2033.39 ml     Intake/Output this shift: No intake/output data recorded.  Labs: Recent Labs    06/29/19 0340  HGB 11.5*   Recent Labs    06/29/19 0340  WBC 12.2*  RBC 3.78*  HCT 34.4*  PLT 176   Recent Labs    06/29/19 0340  NA 133*  K 4.3  CL 99  CO2 27  BUN 14  CREATININE 0.72  GLUCOSE 141*  CALCIUM 9.5   No results for input(s): LABPT, INR in the last 72 hours.  Exam: General - Patient is Alert and Oriented Extremity - Neurologically intact Neurovascular intact Sensation intact distally Dorsiflexion/Plantar flexion intact Dressing - dressing C/D/I Motor Function - intact, moving foot and toes well on exam.   Past Medical History:  Diagnosis Date   Anemia    Arthritis    osteoarthritis - shoulder, knees & hips   Asthma    Coronary artery disease    Depression    Diabetes mellitus without complication (HCC)    no meds   Hypertension    Dr. Orinda Kenner manages BP, pt. reports MD has not found a need for treatment    Hypothyroidism    Sleep difficulties    had sleep study -2009, prior to gastric surgery, told that  there was not a need for f/u   Thyroid disease    Vitamin B 12 deficiency     Assessment/Plan: 1 Day Post-Op Procedure(s) (LRB): TOTAL KNEE ARTHROPLASTY (Right) Active Problems:   Primary osteoarthritis of right knee  Estimated body mass index is 37.99 kg/m as calculated from the following:   Height as of this encounter: 5\' 1"  (1.549 m).   Weight as of this encounter: 91.2 kg. Advance diet Up with therapy D/C IV fluids   Patient's anticipated LOS is less than 2 midnights, meeting these requirements: - Younger than 1 - Lives within 1 hour of care - Has a competent adult at home to recover with post-op recover - NO history of  - Chronic pain requiring opiods  - Diabetes  - Coronary Artery Disease  - Heart failure  - Heart attack  - Stroke  - DVT/VTE  - Cardiac arrhythmia  - Respiratory Failure/COPD  - Renal failure  - Anemia  - Advanced Liver disease  DVT Prophylaxis - Aspirin Weight bearing as tolerated. D/C O2 and pulse ox and try on room air. Continue therapy.  Plan is to go Home after hospital stay. Plan for discharge later today if meeting goals. Scheduled for OPPT at [facility]. Follow-up in the office in 2 weeks.   Theresa Duty, PA-C Orthopedic Surgery (  [phone] 06/29/2019, 7:36 AM

## 2019-07-01 DIAGNOSIS — M25661 Stiffness of right knee, not elsewhere classified: Secondary | ICD-10-CM | POA: Diagnosis not present

## 2019-07-05 DIAGNOSIS — M25661 Stiffness of right knee, not elsewhere classified: Secondary | ICD-10-CM | POA: Diagnosis not present

## 2019-07-07 DIAGNOSIS — M25661 Stiffness of right knee, not elsewhere classified: Secondary | ICD-10-CM | POA: Diagnosis not present

## 2019-07-12 DIAGNOSIS — M25661 Stiffness of right knee, not elsewhere classified: Secondary | ICD-10-CM | POA: Diagnosis not present

## 2019-07-14 DIAGNOSIS — M25661 Stiffness of right knee, not elsewhere classified: Secondary | ICD-10-CM | POA: Diagnosis not present

## 2019-07-15 ENCOUNTER — Other Ambulatory Visit: Payer: Self-pay | Admitting: Family Medicine

## 2019-07-19 DIAGNOSIS — M25661 Stiffness of right knee, not elsewhere classified: Secondary | ICD-10-CM | POA: Diagnosis not present

## 2019-07-21 DIAGNOSIS — M25661 Stiffness of right knee, not elsewhere classified: Secondary | ICD-10-CM | POA: Diagnosis not present

## 2019-07-23 DIAGNOSIS — M25661 Stiffness of right knee, not elsewhere classified: Secondary | ICD-10-CM | POA: Diagnosis not present

## 2019-07-28 DIAGNOSIS — M25661 Stiffness of right knee, not elsewhere classified: Secondary | ICD-10-CM | POA: Diagnosis not present

## 2019-07-29 ENCOUNTER — Telehealth: Payer: Self-pay | Admitting: Interventional Cardiology

## 2019-07-29 NOTE — Telephone Encounter (Signed)
Transferred call to [facility]

## 2019-07-29 NOTE — Telephone Encounter (Signed)
Sent patient mychart message about [facility] and gave her our phone # to call back to discuss

## 2019-07-29 NOTE — Telephone Encounter (Signed)
Pt c/o medication issue:  1. Name of Medication: Evolocumab (REPATHA SURECLICK) 166 MG/ML SOAJ  2. How are you currently taking this medication (dosage and times per day)? As directed   3. Are you having a reaction (difficulty breathing--STAT)? No  4. What is your medication issue? Per patient message and scheduling message from patient, [facility] insurance has increased the expense of a 3 month supply of Repatha from $125.00 to $350.00. She states she can no longer afford the medication and she is requesting assistance from a pharmacist. Please return call to discuss options.

## 2019-07-30 DIAGNOSIS — M25661 Stiffness of right knee, not elsewhere classified: Secondary | ICD-10-CM | POA: Diagnosis not present

## 2019-07-30 MED ORDER — REPATHA SURECLICK 140 MG/ML ~~LOC~~ SOAJ: 1.0000 "pen " | SUBCUTANEOUS | 3 refills | Status: DC

## 2019-07-30 NOTE — Telephone Encounter (Signed)
[facility] approved. Sent to [place] per pt request. Called pharmacy and confirmed a $0 copay.

## 2019-08-03 DIAGNOSIS — Z471 Aftercare following joint replacement surgery: Secondary | ICD-10-CM | POA: Diagnosis not present

## 2019-08-03 DIAGNOSIS — Z96651 Presence of right artificial knee joint: Secondary | ICD-10-CM | POA: Diagnosis not present

## 2019-08-04 DIAGNOSIS — E559 Vitamin D deficiency, unspecified: Secondary | ICD-10-CM | POA: Diagnosis not present

## 2019-08-04 DIAGNOSIS — E038 Other specified hypothyroidism: Secondary | ICD-10-CM | POA: Diagnosis not present

## 2019-08-04 DIAGNOSIS — E210 Primary hyperparathyroidism: Secondary | ICD-10-CM | POA: Diagnosis not present

## 2019-08-04 DIAGNOSIS — E063 Autoimmune thyroiditis: Secondary | ICD-10-CM | POA: Diagnosis not present

## 2019-08-07 ENCOUNTER — Encounter: Payer: Self-pay | Admitting: Family Medicine

## 2019-08-09 NOTE — Telephone Encounter (Signed)
Pt called this morning and is having trouble walking and would like to be seen this week, despite Dr. Yong Channel being out of town. Please advise

## 2019-08-10 ENCOUNTER — Other Ambulatory Visit: Payer: Self-pay

## 2019-08-10 ENCOUNTER — Ambulatory Visit (INDEPENDENT_AMBULATORY_CARE_PROVIDER_SITE_OTHER): Payer: Medicare HMO | Admitting: Family Medicine

## 2019-08-10 ENCOUNTER — Encounter: Payer: Self-pay | Admitting: Family Medicine

## 2019-08-10 VITALS — BP 134/76 | HR 68 | Temp 98.2°F | Ht 61.0 in | Wt 207.0 lb

## 2019-08-10 DIAGNOSIS — M791 Myalgia, unspecified site: Secondary | ICD-10-CM | POA: Diagnosis not present

## 2019-08-10 MED ORDER — PREDNISONE 5 MG PO TABS
ORAL_TABLET | ORAL | 5 refills | Status: DC
Start: 2019-08-10 — End: 2019-08-26

## 2019-08-10 NOTE — Telephone Encounter (Signed)
Scheduled pt with Dr. Jerline Pain

## 2019-08-10 NOTE — Patient Instructions (Signed)
It was very nice to see you today!  We will start prednisone.  Please take 20 mg daily for the next couple weeks until you see Dr. Yong Channel.  We will check blood work today.  Take care, Dr Jerline Pain  Please try these tips to maintain a healthy lifestyle:   Eat at least 3 REAL meals and 1-2 snacks per day.  Aim for no more than 5 hours between eating.  If you eat breakfast, please do so within one hour of getting up.    Each meal should contain half fruits/vegetables, one quarter protein, and one quarter carbs (no bigger than a computer mouse)   Cut down on sweet beverages. This includes juice, soda, and sweet tea.     Drink at least 1 glass of water with each meal and aim for at least 8 glasses per day   Exercise at least 150 minutes every week.

## 2019-08-10 NOTE — Progress Notes (Signed)
   [name] is a 79 y.o. female who presents today for an office visit.  Assessment/Plan:  New/Acute Problems: Myalgia Consistent with prior flare of PMR.  Will start empiric course of prednisone today 20 mg daily.  Will check labs including CBC, CRP, and sed rate.  She will follow-up with her PCP in a couple of weeks.  If labs confirm PMR will likely need to go a slow prednisone taper over the course of the next several months.  Discussed reasons to return to care.     Subjective:  HPI:  Patient is here with right-sided pain for the past 5 days or so.  Feels similar to past flare of polymyalgia rheumatica.  She had right knee replacement 2 months ago.  She contacted her orthopedist due to concern may be related to that however they told her to follow-up with her PCP.  Her PCP is out of the office for the week.  She was diagnosed with polymyalgia rheumatica over a year ago.  She was started on extended prednisone taper with significant improvement in her symptoms.  Now has diffuse myalgias located in bilateral legs, arms, upper back, and face.  No reported chest pain or shortness of breath.      Objective:  Physical Exam: BP 134/76   Pulse 68   Temp 98.2 F (36.8 C)   Ht 5\' 1"  (1.549 m)   Wt 207 lb (93.9 kg)   SpO2 96%   BMI 39.11 kg/m   Gen: No acute distress, resting comfortably CV: Regular rate and rhythm with no murmurs appreciated Pulm: Normal work of breathing, clear to auscultation bilaterally with no crackles, wheezes, or rhonchi MSK: Tenderness to palpation across several major muscle groups in lower extremities and upper extremities. Neuro: Grossly normal, moves all extremities Psych: Normal affect and thought content      Lorien Shingler M. Jerline Pain, MD 08/10/2019 2:59 PM

## 2019-08-11 ENCOUNTER — Encounter: Payer: Self-pay | Admitting: Family Medicine

## 2019-08-11 DIAGNOSIS — E559 Vitamin D deficiency, unspecified: Secondary | ICD-10-CM | POA: Diagnosis not present

## 2019-08-11 DIAGNOSIS — M8580 Other specified disorders of bone density and structure, unspecified site: Secondary | ICD-10-CM | POA: Diagnosis not present

## 2019-08-11 DIAGNOSIS — E038 Other specified hypothyroidism: Secondary | ICD-10-CM | POA: Diagnosis not present

## 2019-08-11 DIAGNOSIS — E210 Primary hyperparathyroidism: Secondary | ICD-10-CM | POA: Diagnosis not present

## 2019-08-11 DIAGNOSIS — E063 Autoimmune thyroiditis: Secondary | ICD-10-CM | POA: Diagnosis not present

## 2019-08-11 LAB — HIGH SENSITIVITY CRP: CRP, High Sensitivity: 11.26 mg/L — ABNORMAL HIGH (ref 0.000–5.000)

## 2019-08-11 LAB — CBC WITH DIFFERENTIAL/PLATELET
Basophils Absolute: 0.1 10*3/uL (ref 0.0–0.1)
Basophils Relative: 1.2 % (ref 0.0–3.0)
Eosinophils Absolute: 0.6 10*3/uL (ref 0.0–0.7)
Eosinophils Relative: 6.3 % — ABNORMAL HIGH (ref 0.0–5.0)
HCT: 38.3 % (ref 36.0–46.0)
Hemoglobin: 12.7 g/dL (ref 12.0–15.0)
Lymphocytes Relative: 14.8 % (ref 12.0–46.0)
Lymphs Abs: 1.5 10*3/uL (ref 0.7–4.0)
MCHC: 33.2 g/dL (ref 30.0–36.0)
MCV: 89.9 fl (ref 78.0–100.0)
Monocytes Absolute: 0.8 10*3/uL (ref 0.1–1.0)
Monocytes Relative: 7.7 % (ref 3.0–12.0)
Neutro Abs: 7.2 10*3/uL (ref 1.4–7.7)
Neutrophils Relative %: 70 % (ref 43.0–77.0)
Platelets: 243 10*3/uL (ref 150.0–400.0)
RBC: 4.26 Mil/uL (ref 3.87–5.11)
RDW: 14.5 % (ref 11.5–15.5)
WBC: 10.3 10*3/uL (ref 4.0–10.5)

## 2019-08-11 LAB — SEDIMENTATION RATE: Sed Rate: 32 mm/hr — ABNORMAL HIGH (ref 0–30)

## 2019-08-12 ENCOUNTER — Ambulatory Visit: Payer: Medicare HMO | Admitting: Family Medicine

## 2019-08-12 NOTE — Progress Notes (Signed)
Please inform patient of the following:  Labs show elevated inflammatory markers. Would like for her to continue the prednisone and follow up with Dr Yong Channel soon. [name]. Jerline Pain, MD 08/12/2019 8:05 AM

## 2019-08-14 ENCOUNTER — Other Ambulatory Visit: Payer: Self-pay | Admitting: Family Medicine

## 2019-08-17 ENCOUNTER — Encounter: Payer: Self-pay | Admitting: Family Medicine

## 2019-08-17 NOTE — Progress Notes (Deleted)
Phone [phone] In person visit   Subjective:   [name] is a 79 y.o. year old very pleasant female patient who presents for/with See problem oriented charting No chief complaint on file.   This visit occurred during the SARS-CoV-2 public health emergency.  Safety protocols were in place, including screening questions prior to the visit, additional usage of staff PPE, and extensive cleaning of exam room while observing appropriate contact time as indicated for disinfecting solutions.   Past Medical History-  Patient Active Problem List   Diagnosis Date Noted  . Primary osteoarthritis of right knee 06/28/2019  . B12 deficiency 05/31/2019  . Coronary artery disease of native artery of native heart with stable angina pectoris ([place]) 08/31/2018  . Chest pain with high risk for cardiac etiology 04/02/2018  . Hypercholesteremia   . History of polymyalgia rheumatica 03/16/2018  . CAD (coronary artery disease) 11/19/2017  . Anxiety 07/17/2017  . Osteopenia of left hip 07/17/2017  . Overactive bladder 09/03/2016  . Morbid obesity ([place]) 06/28/2015  . Hypercalcemia 12/29/2014  . Allergic rhinitis 07/20/2014  . History of gastric bypass 02/08/2014  . OA (osteoarthritis) of knee 10/05/2013  . Hyperlipidemia associated with type 2 diabetes mellitus ([place]) 01/05/2008  . CHEST WALL PAIN, ANTERIOR 10/09/2007  . Hypothyroidism 09/09/2007  . Well controlled type 2 diabetes mellitus ([place]) 07/18/2006  . Major depression, recurrent, full remission ([place]) 07/18/2006  . Hypertension associated with diabetes ([place]) 07/18/2006  . Asthma 07/18/2006    Medications- reviewed and updated Current Outpatient Medications  Medication Sig Dispense Refill  . acetaminophen (TYLENOL) 500 MG tablet Take 1,000 mg by mouth every 8 (eight) hours as needed for mild pain or headache.    . albuterol (PROVENTIL HFA;VENTOLIN HFA) 108 (90 Base) MCG/ACT inhaler Inhale 1-2 puffs into the lungs every 4 (four)  hours as needed for wheezing or shortness of breath. 1 Inhaler 6  . amLODipine (NORVASC) 10 MG tablet TAKE 1 TABLET EVERY DAY 90 tablet 0  . Blood Glucose Monitoring Suppl (TRUE METRIX AIR GLUCOSE METER) w/Device KIT USE AS DIRECTED 1 kit 0  . Cholecalciferol (VITAMIN D-3) 125 MCG (5000 UT) TABS Take 5,000 Units by mouth daily.    . cyanocobalamin (,VITAMIN B-12,) 1000 MCG/ML injection Inject 1 mL (1,000 mcg total) into the muscle every 30 (thirty) days. Uses on amonthly basis 10 mL 0  . desvenlafaxine (PRISTIQ) 100 MG 24 hr tablet TAKE 1 TABLET EVERY DAY 90 tablet 0  . Evolocumab (REPATHA SURECLICK) 592 MG/ML SOAJ Inject 1 pen into the skin every 14 (fourteen) days. 6 pen 3  . FeFum-FePo-FA-B Cmp-C-Zn-Mn-Cu (SE-TAN PLUS) 162-115.2-1 MG CAPS Take 1 capsule by mouth twice daily (Patient taking differently: Take 1 capsule by mouth in the morning and at bedtime. ) 180 capsule 0  . fluticasone (FLONASE) 50 MCG/ACT nasal spray USE 1 SPRAY IN EACH NOSTRIL EVERY DAY 32 g 0  . glucose blood test strip Test blood sugar once per day 100 each 3  . Levothyroxine Sodium (TIROSINT) 150 MCG CAPS Take 300 mcg by mouth daily before breakfast.     . loperamide (IMODIUM) 2 MG capsule Take 4 mg by mouth daily as needed for diarrhea or loose stools.     . montelukast (SINGULAIR) 10 MG tablet TAKE 1 TABLET EVERY DAY WITH BREAKFAST 90 tablet 3  . nitroGLYCERIN (NITROSTAT) 0.4 MG SL tablet Place 1 tablet (0.4 mg total) under the tongue every 5 (five) minutes as needed for chest pain. 25 tablet 6  .  Omega-3 Fatty Acids (FISH OIL) 1000 MG CAPS Take 5,000 mg by mouth daily.     . predniSONE (DELTASONE) 5 MG tablet Take 4 pills daily. 90 tablet 5  . Propylene Glycol (SYSTANE COMPLETE OP) Apply 1 drop to eye 2 (two) times daily as needed (dryness).     . TRUEplus Lancets 30G MISC Check blood sugar once per day 100 each 3   No current facility-administered medications for this visit.     Objective:  There were no vitals  taken for this visit. Gen: NAD, resting comfortably CV: RRR no murmurs rubs or gallops Lungs: CTAB no crackles, wheeze, rhonchi Abdomen: soft/nontender/nondistended/normal bowel sounds. No rebound or guarding.  Ext: no edema Skin: warm, dry Neuro: grossly normal, moves all extremities  ***    Assessment and Plan  #Muscle Pain S:***  A/P: ***     Endocrinology Dr. Mick Sell Optho-Dr. Ellie Lunch  Ortho- Dr. Elmyra Ricks  ***ggt next visit  No problem-specific Assessment & Plan notes found for this encounter.   Recommended follow up: ***No follow-ups on file. Future Appointments  Date Time Provider [place]  08/24/2019  9:20 AM Marin Olp, MD [facility]  10/01/2019 10:40 AM Marin Olp, MD [facility]    Lab/Order associations: No diagnosis found.  No orders of the defined types were placed in this encounter.   Time Spent: *** minutes of total time (4:31 PM***- 4:31 PM***) was spent on the date of the encounter performing the following actions: chart review prior to seeing the patient, obtaining history, performing a medically necessary exam, counseling on the treatment plan, placing orders, and documenting in our EHR.   Return precautions advised.  Clyde Lundborg, CMA

## 2019-08-18 ENCOUNTER — Other Ambulatory Visit: Payer: Self-pay

## 2019-08-18 ENCOUNTER — Encounter: Payer: Self-pay | Admitting: Family Medicine

## 2019-08-18 DIAGNOSIS — J4521 Mild intermittent asthma with (acute) exacerbation: Secondary | ICD-10-CM

## 2019-08-18 MED ORDER — ALBUTEROL SULFATE HFA 108 (90 BASE) MCG/ACT IN AERS
1.0000 | INHALATION_SPRAY | RESPIRATORY_TRACT | 2 refills | Status: DC | PRN
Start: 2019-08-18 — End: 2021-07-19

## 2019-08-19 ENCOUNTER — Ambulatory Visit (INDEPENDENT_AMBULATORY_CARE_PROVIDER_SITE_OTHER): Payer: Medicare HMO | Admitting: Family Medicine

## 2019-08-19 ENCOUNTER — Other Ambulatory Visit: Payer: Self-pay

## 2019-08-19 ENCOUNTER — Encounter: Payer: Self-pay | Admitting: Family Medicine

## 2019-08-19 ENCOUNTER — Ambulatory Visit (INDEPENDENT_AMBULATORY_CARE_PROVIDER_SITE_OTHER)
Admission: RE | Admit: 2019-08-19 | Discharge: 2019-08-19 | Disposition: A | Discharge status: Home / Self Care | Payer: Medicare HMO | Source: Ambulatory Visit | Attending: Family Medicine | Admitting: Family Medicine

## 2019-08-19 VITALS — BP 130/70 | HR 61 | Temp 98.2°F | Resp 18 | Ht 61.0 in | Wt 203.4 lb

## 2019-08-19 DIAGNOSIS — E119 Type 2 diabetes mellitus without complications: Secondary | ICD-10-CM

## 2019-08-19 DIAGNOSIS — R079 Chest pain, unspecified: Secondary | ICD-10-CM | POA: Diagnosis not present

## 2019-08-19 DIAGNOSIS — M353 Polymyalgia rheumatica: Secondary | ICD-10-CM | POA: Diagnosis not present

## 2019-08-19 DIAGNOSIS — M25571 Pain in right ankle and joints of right foot: Secondary | ICD-10-CM

## 2019-08-19 DIAGNOSIS — M19071 Primary osteoarthritis, right ankle and foot: Secondary | ICD-10-CM | POA: Diagnosis not present

## 2019-08-19 MED ORDER — NITROGLYCERIN 0.4 MG SL SUBL
0.4000 mg | SUBLINGUAL_TABLET | SUBLINGUAL | 3 refills | Status: DC | PRN
Start: 2019-08-19 — End: 2021-07-19

## 2019-08-19 NOTE — Assessment & Plan Note (Signed)
S: Recurrence was suspected by Dr. Raynelle Chary did have elevated ESR of 32And CRP level of 11.2.  She was started on prednisone 20 mg.  Today she reports symptoms are finally starting to improve. Shoulders, neck, left TMJ joint really bothering her. Upper legs are bothering her. Certainly worse in morning and hard to get out of bed. Very low energy. Some night sweats with prednisone.   Had to see Dr. Elmyra Ricks yesterday and was having some drainage from knee- had suture abscess that had to be drained. Was not told needed antibiotics.   Pain level in morning about 5/10 and last week was up to 8-9/10.   Has had some chest pressure intermittently. Usually at night. Doesn't feel like her typical chest pain shes had with CAD. No shortness of breath. TMJ pain is not associated with that. No blurry vision- - pain is down in her typical TMJ area. Has some central forehead pressure as well since starting prednisone A/P: Seems to have recurrence of polymyalgia rheumatica-some improvement but still pretty significant symptoms in the morning up to 5 out of 10 pain with significant fatigue and weakness-seems to have mild weakness with hip flexion.  We opted to increase to max dose prednisone 25 mg for 2 weeks and then reassess -Depending on how she does with this course-could consider rheumatology input  She has had some mild frontal headaches-I do not suspect temporal arteritis.  She will let us know immediately if headache shift more to the side or if has blurry vision.  TMJ may possibly be helped by prednisone-we will continue higher dose 25 mg for 2 weeks.  Also could consider something like baclofen  For mild nighttime chest pressure portion-this can certainly be anxiety/stress related or related to prednisone -we will refill nitroglycerin and I asked her to trial this-if this is beneficial encouraged her to follow-up with Dr. Tamala Julian.  EKG was largely reassuring today but obviously that does not fully rule out  cardiac disease.  She has no shortness of breath or exertional symptoms.

## 2019-08-19 NOTE — Patient Instructions (Addendum)
          I am so sorry you are having such a hard time lately. Hope you will continue to improve.   Chest pressure: We have checked EKG today. If you have any increased or change in symptoms please get evaluation ASAP. If you have any changes in vision, increased chest pain with exertion, or SOB please call 911 or go to ED. If you have chest pressure try the nitroglycerin. If that helps call cardiology for follow up.   History of polymyalgia rheumatica: so sorry you are having increased pain. The night sweats are likely due to the prednisone. We would like to increase prednisone by 5 mg for two weeks. We will make follow up in office in two weeks to follow up on this. If you have any increased or change in symptoms let our office know. Keep an eye on your blood sugar if you get any readings in 200's or higher let our office know.  Ankle pain: we have ordered xray today. We will call with results.  I have ordered xrays for you. At this time we do not have xrays in our clinic. You will have to go to our [place] clinic. The address is [address]  xray is located in the basement.  Hours of operation are M-F 8:30am to 5:00pm.  Closed for lunch between 12:30 and 1:00pm.    If you have any questions or concerns before your next scheduled appointment please give our office a call.       Recommended follow up: No follow-ups on file.

## 2019-08-19 NOTE — Progress Notes (Signed)
Phone [phone] In person visit   Subjective:   [name] is a 79 y.o. year old very pleasant female patient who presents for/with See problem oriented charting Chief Complaint  Patient presents with  . Chest Pain    x 2 nights ago is when she first started to feel chest pressure. She stated that the pressure comes and goes.    This visit occurred during the SARS-CoV-2 public health emergency.  Safety protocols were in place, including screening questions prior to the visit, additional usage of staff PPE, and extensive cleaning of exam room while observing appropriate contact time as indicated for disinfecting solutions.   Past Medical History-  Patient Active Problem List   Diagnosis Date Noted  . Polymyalgia rheumatica ([place]) 03/16/2018    Priority: High  . CAD (coronary artery disease) 11/19/2017    Priority: High  . Hypercalcemia 12/29/2014    Priority: High  . OA (osteoarthritis) of knee 10/05/2013    Priority: High  . Well controlled type 2 diabetes mellitus ([place]) 07/18/2006    Priority: High  . B12 deficiency 05/31/2019    Priority: Medium  . Overactive bladder 09/03/2016    Priority: Medium  . Morbid obesity ([place]) 06/28/2015    Priority: Medium  . History of gastric bypass 02/08/2014    Priority: Medium  . Hyperlipidemia associated with type 2 diabetes mellitus ([place]) 01/05/2008    Priority: Medium  . Hypothyroidism 09/09/2007    Priority: Medium  . Major depression, recurrent, full remission ([place]) 07/18/2006    Priority: Medium  . Hypertension associated with diabetes ([name]) 07/18/2006    Priority: Medium  . Asthma 07/18/2006    Priority: Medium  . Allergic rhinitis 07/20/2014    Priority: Low  . CHEST WALL PAIN, ANTERIOR 10/09/2007    Priority: Low  . Primary osteoarthritis of right knee 06/28/2019  . Coronary artery disease of native artery of native heart with stable angina pectoris ([place]) 08/31/2018  . Chest pain with high risk for cardiac  etiology 04/02/2018  . Hypercholesteremia   . Anxiety 07/17/2017  . Osteopenia of left hip 07/17/2017    Medications- reviewed and updated Current Outpatient Medications  Medication Sig Dispense Refill  . acetaminophen (TYLENOL) 500 MG tablet Take 1,000 mg by mouth every 8 (eight) hours as needed for mild pain or headache.    Marland Kitchen amLODipine (NORVASC) 10 MG tablet TAKE 1 TABLET EVERY DAY 90 tablet 0  . aspirin EC 81 MG tablet Take 81 mg by mouth daily. Swallow whole.    . Blood Glucose Monitoring Suppl (TRUE METRIX AIR GLUCOSE METER) w/Device KIT USE AS DIRECTED 1 kit 0  . Cholecalciferol (VITAMIN D-3) 125 MCG (5000 UT) TABS Take 5,000 Units by mouth daily.    . cyanocobalamin (,VITAMIN B-12,) 1000 MCG/ML injection Inject 1 mL (1,000 mcg total) into the muscle every 30 (thirty) days. Uses on amonthly basis 10 mL 0  . desvenlafaxine (PRISTIQ) 100 MG 24 hr tablet TAKE 1 TABLET EVERY DAY 90 tablet 0  . diclofenac (VOLTAREN) 75 MG EC tablet Take 75 mg by mouth daily.    . Evolocumab (REPATHA SURECLICK) 222 MG/ML SOAJ Inject 1 pen into the skin every 14 (fourteen) days. 6 pen 3  . FeFum-FePo-FA-B Cmp-C-Zn-Mn-Cu (SE-TAN PLUS) 162-115.2-1 MG CAPS Take 1 capsule by mouth twice daily (Patient taking differently: Take 1 capsule by mouth in the morning and at bedtime. ) 180 capsule 0  . fluticasone (FLONASE) 50 MCG/ACT nasal spray USE 1 SPRAY IN [facility]  NOSTRIL EVERY DAY 32 g 0  . glucose blood test strip Test blood sugar once per day 100 each 3  . Levothyroxine Sodium (TIROSINT) 150 MCG CAPS Take 300 mcg by mouth daily before breakfast.     . loperamide (IMODIUM) 2 MG capsule Take 4 mg by mouth daily as needed for diarrhea or loose stools.     . montelukast (SINGULAIR) 10 MG tablet TAKE 1 TABLET EVERY DAY WITH BREAKFAST 90 tablet 3  . nitroGLYCERIN (NITROSTAT) 0.4 MG SL tablet Place 1 tablet (0.4 mg total) under the tongue every 5 (five) minutes as needed for chest pain. 25 tablet 6  . Omega-3 Fatty Acids  (FISH OIL) 1000 MG CAPS Take 5,000 mg by mouth daily.     . predniSONE (DELTASONE) 5 MG tablet Take 4 pills daily. 90 tablet 5  . Propylene Glycol (SYSTANE COMPLETE OP) Apply 1 drop to eye 2 (two) times daily as needed (dryness).     . TRUEplus Lancets 30G MISC Check blood sugar once per day 100 each 3  . albuterol (VENTOLIN HFA) 108 (90 Base) MCG/ACT inhaler Inhale 1-2 puffs into the lungs every 4 (four) hours as needed for wheezing or shortness of breath. (Patient not taking: Reported on 08/19/2019) 18 g 2  . nitroGLYCERIN (NITROSTAT) 0.4 MG SL tablet Place 1 tablet (0.4 mg total) under the tongue every 5 (five) minutes as needed for chest pain. 50 tablet 3   No current facility-administered medications for this visit.     Objective:  BP (!) 130/70   Pulse 61   Temp 98.2 F (36.8 C) (Temporal)   Resp 18   Ht 5' 1"  (1.549 m)   Wt (!) 203 lb 6.4 oz (92.3 kg)   SpO2 97%   BMI 38.43 kg/m  Gen: NAD, resting comfortably Pain over TMJ joint with palpation-worse with jaw movement CV: RRR no murmurs rubs or gallops Lungs: CTAB no crackles, wheeze, rhonchi Abdomen: soft/nontender/nondistended Ext: no edema-reports right ankle pain Skin: warm, dry  EKG: sinus bradycardia with rate 59, normal axis, normal intervals, no hypertrophy, no st or t wave changes. Isolated q wave in III. Poor amplitude qrs complex in avf- would nto clearly call this a q wave.      Assessment and Plan   #Polymyalgia rheumatica S: Recurrence was suspected by Dr. Raynelle Chary did have elevated ESR of 32And CRP level of 11.2.  She was started on prednisone 20 mg.  Today she reports symptoms are finally starting to improve. Shoulders, neck, left TMJ joint really bothering her. Upper legs are bothering her. Certainly worse in morning and hard to get out of bed. Very low energy. Some night sweats with prednisone.   Had to see Dr. Elmyra Ricks yesterday and was having some drainage from knee- had suture abscess that had to  be drained. Was not told needed antibiotics.   Pain level in morning about 5/10 and last week was up to 8-9/10.   Has had some chest pressure intermittently. Usually at night. Doesn't feel like her typical chest pain shes had with CAD. No shortness of breath. TMJ pain is not associated with that. No blurry vision- - pain is down in her typical TMJ area. Has some central forehead pressure as well since starting prednisone A/P: Seems to have recurrence of polymyalgia rheumatica-some improvement but still pretty significant symptoms in the morning up to 5 out of 10 pain with significant fatigue and weakness-seems to have mild weakness with hip flexion.  We opted to  increase to max dose prednisone 25 mg for 2 weeks and then reassess -Depending on how she does with this course-could consider rheumatology input  She has had some mild frontal headaches-I do not suspect temporal arteritis.  She will let us know immediately if headache shift more to the side or if has blurry vision.  TMJ may possibly be helped by prednisone-we will continue higher dose 25 mg for 2 weeks.  Also could consider something like baclofen  For mild nighttime chest pressure portion-this can certainly be anxiety/stress related or related to prednisone -we will refill nitroglycerin and I asked her to trial this-if this is beneficial encouraged her to follow-up with Dr. Tamala Julian.  EKG was largely reassuring today but obviously that does not fully rule out cardiac disease.  She has no shortness of breath or exertional symptoms.  # Diabetes S: Medication: Diet controlled CBGs-does not check Exercise and diet-had been doing [employer] 3x a week and cant go right now from fatigue plus just 2 months post op from surgery.  Lab Results  Component Value Date   HGBA1C 6.0 05/31/2019   HGBA1C 6.1 (H) 11/25/2018   HGBA1C 6.0 03/03/2018   A/P: I asked patient to keep an eye on her blood sugars to make sure they do not increase above perhaps  200 while on higher dose prednisone-hopefully this will improve as we bring prednisone dose back down at future visit  # history of stress fracture tibia- now having right ankle S:right ankle pain for 4-5 weeks- wants to make sure no fracture.   A/P: X-ray was ordered today-offered sports medicine referral but she wants to hold off until follow-up visit  #social update July 2021- in [place] friend dying of multiple myeloma plus is dealing with covid and cant be there with her.  1 right side grandson with Dr. Berneice Gandy physical therapy has started a position at [employer] which he is thrilled about  Recommended follow up: 2-week follow-up Future Appointments  Date Time Provider [place]  09/03/2019  9:20 AM Marin Olp, MD [facility]  10/01/2019 10:40 AM Yong Channel Brayton Mars, MD [facility]    Lab/Order associations:   ICD-10-CM   1. Polymyalgia rheumatica (HCC)  M35.3   2. Chest pain, unspecified type  R07.9 EKG 12-Lead  3. Acute right ankle pain  M25.571 DG Ankle Complete Right  4. Well controlled type 2 diabetes mellitus ([place])  E11.9     Meds ordered this encounter  Medications  . nitroGLYCERIN (NITROSTAT) 0.4 MG SL tablet    Sig: Place 1 tablet (0.4 mg total) under the tongue every 5 (five) minutes as needed for chest pain.    Dispense:  50 tablet    Refill:  3    Return precautions advised.  Garret Reddish, MD

## 2019-08-20 ENCOUNTER — Telehealth: Payer: Self-pay | Admitting: Family Medicine

## 2019-08-20 NOTE — Telephone Encounter (Signed)
Have added information to lab notes.

## 2019-08-20 NOTE — Telephone Encounter (Signed)
Patient is returning Joellen's call about x-ray results, seen Dr.Hunter's message and says that she does not want a referral at this moment.

## 2019-08-24 ENCOUNTER — Ambulatory Visit: Payer: Medicare HMO | Admitting: Family Medicine

## 2019-08-26 ENCOUNTER — Encounter: Payer: Self-pay | Admitting: Family Medicine

## 2019-08-26 MED ORDER — PREDNISONE 5 MG PO TABS: ORAL_TABLET | ORAL | 5 refills | Status: DC

## 2019-08-29 ENCOUNTER — Other Ambulatory Visit: Payer: Self-pay

## 2019-08-29 ENCOUNTER — Observation Stay (HOSPITAL_COMMUNITY)
Admission: EM | Admit: 2019-08-29 | Discharge: 2019-08-30 | Disposition: A | Discharge status: Home / Self Care | Payer: Medicare HMO | Attending: Internal Medicine | Admitting: Internal Medicine

## 2019-08-29 ENCOUNTER — Emergency Department (HOSPITAL_COMMUNITY): Payer: Medicare HMO

## 2019-08-29 ENCOUNTER — Encounter (HOSPITAL_COMMUNITY): Payer: Self-pay

## 2019-08-29 DIAGNOSIS — I25118 Atherosclerotic heart disease of native coronary artery with other forms of angina pectoris: Secondary | ICD-10-CM | POA: Diagnosis not present

## 2019-08-29 DIAGNOSIS — M1990 Unspecified osteoarthritis, unspecified site: Secondary | ICD-10-CM | POA: Diagnosis not present

## 2019-08-29 DIAGNOSIS — I10 Essential (primary) hypertension: Secondary | ICD-10-CM | POA: Insufficient documentation

## 2019-08-29 DIAGNOSIS — Z7982 Long term (current) use of aspirin: Secondary | ICD-10-CM | POA: Diagnosis not present

## 2019-08-29 DIAGNOSIS — Z20822 Contact with and (suspected) exposure to covid-19: Secondary | ICD-10-CM | POA: Diagnosis not present

## 2019-08-29 DIAGNOSIS — J45909 Unspecified asthma, uncomplicated: Secondary | ICD-10-CM | POA: Diagnosis not present

## 2019-08-29 DIAGNOSIS — I2510 Atherosclerotic heart disease of native coronary artery without angina pectoris: Secondary | ICD-10-CM | POA: Diagnosis present

## 2019-08-29 DIAGNOSIS — E039 Hypothyroidism, unspecified: Secondary | ICD-10-CM | POA: Insufficient documentation

## 2019-08-29 DIAGNOSIS — E119 Type 2 diabetes mellitus without complications: Secondary | ICD-10-CM | POA: Insufficient documentation

## 2019-08-29 DIAGNOSIS — F329 Major depressive disorder, single episode, unspecified: Secondary | ICD-10-CM | POA: Insufficient documentation

## 2019-08-29 DIAGNOSIS — E1159 Type 2 diabetes mellitus with other circulatory complications: Secondary | ICD-10-CM | POA: Diagnosis present

## 2019-08-29 DIAGNOSIS — R079 Chest pain, unspecified: Secondary | ICD-10-CM

## 2019-08-29 DIAGNOSIS — R0789 Other chest pain: Secondary | ICD-10-CM | POA: Diagnosis not present

## 2019-08-29 DIAGNOSIS — Z79899 Other long term (current) drug therapy: Secondary | ICD-10-CM | POA: Insufficient documentation

## 2019-08-29 LAB — TROPONIN I (HIGH SENSITIVITY)
Troponin I (High Sensitivity): 7 ng/L (ref <18)
Troponin I (High Sensitivity): 7 ng/L (ref <18)

## 2019-08-29 LAB — BASIC METABOLIC PANEL
Anion gap: 12 (ref 5–15)
BUN: 17 mg/dL (ref 8–23)
CO2: 26 mmol/L (ref 22–32)
Calcium: 10.1 mg/dL (ref 8.9–10.3)
Chloride: 96 mmol/L — ABNORMAL LOW (ref 98–111)
Creatinine, Ser: 0.94 mg/dL (ref 0.44–1.00)
GFR calc Af Amer: >60 mL/min (ref >60)
GFR calc non Af Amer: 58 mL/min — ABNORMAL LOW (ref >60)
Glucose, Bld: 199 mg/dL — ABNORMAL HIGH (ref 70–99)
Potassium: 4.6 mmol/L (ref 3.5–5.1)
Sodium: 134 mmol/L — ABNORMAL LOW (ref 135–145)

## 2019-08-29 LAB — CBC
HCT: 41.1 % (ref 36.0–46.0)
Hemoglobin: 12.7 g/dL (ref 12.0–15.0)
MCH: 28.3 pg (ref 26.0–34.0)
MCHC: 30.9 g/dL (ref 30.0–36.0)
MCV: 91.5 fL (ref 80.0–100.0)
Platelets: 251 10*3/uL (ref 150–400)
RBC: 4.49 MIL/uL (ref 3.87–5.11)
RDW: 14.5 % (ref 11.5–15.5)
WBC: 10.7 10*3/uL — ABNORMAL HIGH (ref 4.0–10.5)
nRBC: 0 % (ref 0.0–0.2)

## 2019-08-29 MED ORDER — LEVOTHYROXINE SODIUM 100 MCG PO TABS
300.0000 ug | ORAL_TABLET | Freq: Every day | ORAL | Status: DC
  Administered 2019-08-30: 300 ug via ORAL
  Filled 2019-08-29: qty 3

## 2019-08-29 MED ORDER — ASPIRIN EC 81 MG PO TBEC
81.0000 mg | DELAYED_RELEASE_TABLET | Freq: Every day | ORAL | Status: DC
  Filled 2019-08-29: qty 1

## 2019-08-29 MED ORDER — ONDANSETRON HCL 4 MG/2ML IJ SOLN: 4.0000 mg | Freq: Four times a day (QID) | INTRAMUSCULAR | Status: DC | PRN

## 2019-08-29 MED ORDER — PREDNISONE 5 MG PO TABS
25.0000 mg | ORAL_TABLET | Freq: Every day | ORAL | Status: DC
  Administered 2019-08-30: 25 mg via ORAL
  Filled 2019-08-29 (×2): qty 1

## 2019-08-29 MED ORDER — NITROGLYCERIN 0.4 MG SL SUBL: 0.4000 mg | SUBLINGUAL_TABLET | SUBLINGUAL | Status: DC | PRN

## 2019-08-29 MED ORDER — HYPROMELLOSE (GONIOSCOPIC) 2.5 % OP SOLN
Freq: Every morning | OPHTHALMIC | Status: DC
  Filled 2019-08-29: qty 15

## 2019-08-29 MED ORDER — DESVENLAFAXINE SUCCINATE ER 100 MG PO TB24
100.0000 mg | ORAL_TABLET | Freq: Every day | ORAL | Status: DC
  Filled 2019-08-29: qty 1

## 2019-08-29 MED ORDER — ALBUTEROL SULFATE (2.5 MG/3ML) 0.083% IN NEBU: 3.0000 mL | INHALATION_SOLUTION | RESPIRATORY_TRACT | Status: DC | PRN

## 2019-08-29 MED ORDER — SODIUM CHLORIDE 0.9% FLUSH: 3.0000 mL | Freq: Once | INTRAVENOUS | Status: DC

## 2019-08-29 MED ORDER — ENOXAPARIN SODIUM 40 MG/0.4ML ~~LOC~~ SOLN
40.0000 mg | Freq: Every day | SUBCUTANEOUS | Status: DC
  Administered 2019-08-30: 40 mg via SUBCUTANEOUS
  Filled 2019-08-29: qty 0.4

## 2019-08-29 MED ORDER — INSULIN ASPART 100 UNIT/ML ~~LOC~~ SOLN
0.0000 [IU] | SUBCUTANEOUS | Status: DC
  Administered 2019-08-30: 2 [IU] via SUBCUTANEOUS

## 2019-08-29 MED ORDER — FLUTICASONE PROPIONATE 50 MCG/ACT NA SUSP
2.0000 | Freq: Every day | NASAL | Status: DC
  Filled 2019-08-29: qty 16

## 2019-08-29 MED ORDER — ACETAMINOPHEN 325 MG PO TABS
650.0000 mg | ORAL_TABLET | ORAL | Status: DC | PRN
  Administered 2019-08-30: 650 mg via ORAL
  Filled 2019-08-29: qty 2

## 2019-08-29 MED ORDER — AMLODIPINE BESYLATE 10 MG PO TABS
10.0000 mg | ORAL_TABLET | Freq: Every day | ORAL | Status: DC
  Administered 2019-08-30: 10 mg via ORAL
  Filled 2019-08-29: qty 2

## 2019-08-29 NOTE — Consult Note (Signed)
CARDIOLOGY CONSULT NOTE  Patient ID: [name] MRN: [id number] DOB/AGE: 79-Jul-1942 79 y.o.  Admit date: 08/29/2019 Primary Cardiologist Dr. Tamala Julian Chief Complaint  Chest Pain Requesting  ED  HPI:  [name] is a 79 y.o. female with a hx of CAD,gastric bypass surgery, diet controlled DM, HTN, HLD, hypothyroidism and polymalgia rheumaticawho presents to the ED with acute onset chest pain. Cardiology is consulted for assistance with evaluation and management.   Patient was in her usual state of health until mid-afternoon on 8/8 when she developed acute onset chest pain while bringing groceries into her home. She describes the pain as 7/10 in severity, substernal, sharp stabbing pain without significant radiation. It was associated with nausea (without emesis), diaphoresis, and a clammy sensation. She took one SLNTG and after 10 minutes took another dose due to persistent pain. After that, she called EMS and her daughter and was instructed to take full dose aspirin; after which she states her chest pain resolved. All told, she believes that her chest pain lasted approximately 10-15 mins.   In the ED, she was found to be hemodynamically stable with grossly normal labs and negative troponin x2. Her ECG showed normal sinus rhythm with no dynamic ST changes.   Outside of this episode of chest pain, patient reports that she has not had any other similar episodes. She had a LHC in 03/2018 after developing chest pain. Her cath at that time showed multi-vessel disease and medical management was recommended with good control of her symptoms and good adherence with her regimen.   At the time of my interview, patient states that she is comfortable and denies any associated chest pain, exertional chest pressure/discomfort, dyspnea/tachypnea, paroxysmal nocturnal dyspnea/orthopnea, irregular heart beat/palpitations, presyncope/syncope, lower extremity edema, claudication, or abdominal  distention. There are no clear localizing signs or symptoms of infection.    Past Medical History:  Diagnosis Date  . Anemia   . Arthritis    osteoarthritis - shoulder, knees & hips  . Asthma   . Coronary artery disease   . Depression   . Diabetes mellitus without complication (HCC)    no meds  . Hypertension    Dr. Orinda Kenner manages BP, pt. reports MD has not found a need for treatment   . Hypothyroidism   . Sleep difficulties    had sleep study -2009, prior to gastric surgery, told that there was not a need for f/u  . Thyroid disease   . Vitamin B 12 deficiency     Past Surgical History:  Procedure Laterality Date  . ABDOMINAL HYSTERECTOMY    . BARIATRIC SURGERY    . CARDIAC CATHETERIZATION     [facility]- 30 yrs. ago  . CATARACT EXTRACTION, BILATERAL     late 2018  . LEFT HEART CATH AND CORONARY ANGIOGRAPHY N/A 04/02/2018   Procedure: LEFT HEART CATH AND CORONARY ANGIOGRAPHY;  Surgeon: Martinique, Peter M, MD;  Location: [place] [facility];  Service: Cardiovascular;  Laterality: N/A;  . OOPHORECTOMY    . pantallor arthrodesis with rod placement left foot    . TONSILLECTOMY    . TOTAL KNEE ARTHROPLASTY Left 11/30/2018   Procedure: TOTAL KNEE ARTHROPLASTY;  Surgeon: Gaynelle Arabian, MD;  Location: [facility];  Service: Orthopedics;  Laterality: Left;  . TOTAL KNEE ARTHROPLASTY Right 06/28/2019   Procedure: TOTAL KNEE ARTHROPLASTY;  Surgeon: Gaynelle Arabian, MD;  Location: [facility];  Service: Orthopedics;  Laterality: Right;  29min  . TOTAL SHOULDER ARTHROPLASTY Left 03/12/2012  Procedure: TOTAL SHOULDER ARTHROPLASTY;  Surgeon: Marin Shutter, MD;  Location: [place];  Service: Orthopedics;  Laterality: Left;    Allergies  Allergen Reactions  . Flexeril [Cyclobenzaprine] Other (See Comments)    On Pristiq- possible serotonin reaction to flexeril  . Ace Inhibitors Other (See Comments)    States after a surgery she was told to "not take" this class of drugs- not 100% sure what the reason was  .  Codeine Nausea Only and Other (See Comments)    Upset stomach  . Singulair [Montelukast] Other (See Comments)    Causes depression  . Azithromycin Rash   (Not in a hospital admission)  Family History  Problem Relation Age of Onset  . Stroke Mother   . Liver cancer Father   . Cancer Father        liver  . Breast cancer Neg Hx     Social History   Socioeconomic History  . Marital status: Widowed    Spouse name: Not on file  . Number of children: Not on file  . Years of education: Not on file  . Highest education level: Not on file  Occupational History  . Occupation: retired Optician, dispensing: RETIRED  Tobacco Use  . Smoking status: Former Smoker    Packs/day: 0.70    Years: 20.00    Pack years: 14.00    Quit date: 03/04/1978    Years since quitting: 41.5  . Smokeless tobacco: Never Used  Vaping Use  . Vaping Use: Never used  Substance and Sexual Activity  . Alcohol use: Yes    Alcohol/week: 2.0 standard drinks    Types: 2 Glasses of wine per week    Comment: with dinner  or social rare -   . Drug use: No  . Sexual activity: Not Currently  Other Topics Concern  . Not on file  Social History Narrative   Widowed 2013. 2 kids. 4 grandkids. Oldest daughter lives in [place] and has 2 grandkids that are now in Scientist, physiological, PT school at [employer].       Retired-RN in operating room at [place]: play cards with group, active with [employer] ([place]   Social Determinants of Health   Financial Resource Strain:   . Difficulty of Paying Living Expenses:   Food Insecurity:   . Worried About Charity fundraiser in the Last Year:   . Arboriculturist in the Last Year:   Transportation Needs:   . Film/video editor (Medical):   Marland Kitchen Lack of Transportation (Non-Medical):   Physical Activity:   . Days of Exercise per Week:   . Minutes of Exercise per Session:   Stress:   . Feeling of Stress :   Social Connections:   . Frequency of  Communication with Friends and Family:   . Frequency of Social Gatherings with Friends and Family:   . Attends Religious Services:   . Active Member of Clubs or Organizations:   . Attends Archivist Meetings:   Marland Kitchen Marital Status:   Intimate Partner Violence:   . Fear of Current or Ex-Partner:   . Emotionally Abused:   Marland Kitchen Physically Abused:   . Sexually Abused:      Review of Systems: [y] = yes, [ ]  = no       General: Weight gain [] ; Weight loss [ ] ; Anorexia [ ] ; Fatigue [ ] ; Fever [ ] ;  Chills [ ] ; Weakness [ ]     Cardiac: As reported in HPI   Pulmonary: Cough [ ] ; Wheezing[ ] ; Hemoptysis[ ] ; Sputum [ ] ; Snoring [ ]     GI: Vomiting[ ] ; Dysphagia[ ] ; Melena[ ] ; Hematochezia [ ] ; Heartburn[ ] ; Abdominal pain [ ] ; Constipation [ ] ; Diarrhea [ ] ; BRBPR [ ]     GU: Hematuria[ ] ; Dysuria [ ] ; Nocturia[ ]   Vascular: Pain in legs with walking [ ] ; Pain in feet with lying flat [ ] ; Non-healing sores [ ] ; Stroke [ ] ; TIA [ ] ; Slurred speech [ ] ;    Neuro: Headaches[ ] ; Vertigo[ ] ; Seizures[ ] ; Paresthesias[ ] ;Blurred vision [ ] ; Diplopia [ ] ; Vision changes [ ]     Ortho/Skin: Arthritis [ ] ; Joint pain [ ] ; Muscle pain [ ] ; Joint swelling [ ] ; Back Pain [ ] ; Rash [ ]     Psych: Depression[ ] ; Anxiety[ ]     Heme: Bleeding problems [ ] ; Clotting disorders [ ] ; Anemia [ ]     Endocrine: Diabetes [ ] ; Thyroid dysfunction[ ]   Physical Exam: Blood pressure (!) 188/86, pulse (!) 59, temperature 98.9 F (37.2 C), temperature source Oral, resp. rate 12, height 5\' 1"  (1.549 m), weight 92.1 kg, SpO2 98 %.   GENERAL: Patient is afebrile, Vital signs reviewed, Well appearing, Patient appears comfortable, Alert and lucid. EYES: Normal inspection. HEENT:  normocephalic, atraumatic , normal ENT inspection. ORAL:  Moist. CARD:  Brady, regular rhythm, heart sounds normal. RESP:  no respiratory distress, breath sounds normal. ABD: soft, nontender to palpation, normal bowel sounds MUSC:   normal ROM, non-tender , no pedal edema . SKIN: color normal, no rash, warm, dry . NEURO: awake & alert, lucid, no motor/sensory deficit. Gait stable. PSYCH: mood/affect normal.  Labs: Lab Results  Component Value Date   BUN 17 08/29/2019   Lab Results  Component Value Date   CREATININE 0.94 08/29/2019   Lab Results  Component Value Date   NA 134 (L) 08/29/2019   K 4.6 08/29/2019   CL 96 (L) 08/29/2019   CO2 26 08/29/2019   Lab Results  Component Value Date   TROPONINI <0.03 04/02/2018   Lab Results  Component Value Date   WBC 10.7 (H) 08/29/2019   HGB 12.7 08/29/2019   HCT 41.1 08/29/2019   MCV 91.5 08/29/2019   PLT 251 08/29/2019   Lab Results  Component Value Date   CHOL 114 01/11/2019   HDL 53 01/11/2019   LDLCALC 34 01/11/2019   LDLDIRECT 60.0 05/31/2019   TRIG 162 (H) 01/11/2019   CHOLHDL 2.2 01/11/2019   Lab Results  Component Value Date   ALT 20 06/16/2019   AST 26 06/16/2019   ALKPHOS 114 06/16/2019   BILITOT 0.6 06/16/2019      Radiology:    CXR: MPRESSION: No active cardiopulmonary disease.  EKG: NSR, Q waves in inferior leads (similar to prior)      LHC: (04/02/18)             Dist Cx-1 lesion is 65% stenosed.  Dist Cx-2 lesion is 70% stenosed.  1st Diag lesion is 75% stenosed.  The left ventricular systolic function is normal.  LV end diastolic pressure is normal.  The left ventricular ejection fraction is 55-65% by visual estimate.  Mid RCA lesion is 70% stenosed.   1. Modest CAD involving a small first diagonal, distal LCx and nondominant RCA 2. Normal LV function 3. Normal LVEDP    ASSESSMENT AND PLAN:  [name]  is a 79 y.o. female with a hx of CAD,gastric bypass surgery, diet controlled DM, HTN, HLD, hypothyroidism and polymalgia rheumaticawho presents to the ED with acute onset chest pain. Cardiology is consulted for assistance with evaluation and management. Patient with negative troponin x2 and  nonischemic appearing ECG. Chest pain free at present.    # History of CAD # Chest Pain # DMII # HTN # HLD  - Please keep patient NPO except for meds - Will plan for repeat noninvasive stress imaging - Trend trop and serial ECGs - Cont home antihypertensive regimen - Aggressive risk factor modification with glycemic control, smoking cessation, and GDMT              - Lipid panel and A1c pending  - Could consider transition to ARB (in place of amlodipine) given her DMII and CAD indication - Hold all AV nodal blocking agents overnight in prep for stress test   Signed: Clois Dupes 08/29/2019, 9:37 PM

## 2019-08-29 NOTE — ED Provider Notes (Addendum)
[facility] EMERGENCY DEPARTMENT Provider Note   CSN: [id number] Arrival date & time: 08/29/19  1558     History Chief Complaint  Patient presents with  . Chest Pain    [name] is a 79 y.o. female with history of coronary artery disease, diabetes mellitus, hypertension, hypothyroidism, polymyalgia rheumatica, obesity, hyperlipidemia, depression presents for evaluation of acute onset, resolved episode of chest pain.  She reports that symptoms began at 3:05 PM as she was bringing groceries into her home.  She does note that she was exerting herself during this time.  The pain was substernal, sharp, pressure, radiated to little to the right side.  It was associated with nausea, shortness of breath, diaphoresis.  No vomiting, syncope, abdominal pain.  She took a full size aspirin and 2 sublingual nitroglycerin with complete resolution of her symptoms.  Reports that the episode all in all occurred for around 10 to 15 minutes.  She is a former smoker, quit several decades ago.  She denies recreational drug use or excessive alcohol intake.  She underwent cardiac catheterization on 04/02/2018 which showed multivessel disease with up to 75% stenosis and at the time the recommendation was for continued medical management.  Cardiologist is Dr. Tamala Julian.  She is currently chest pain-free.     The history is provided by the patient.    HPI: A 79 year old patient with a history of hypertension, hypercholesterolemia and obesity presents for evaluation of chest pain. Initial onset of pain was approximately 3-6 hours ago. The patient's chest pain is described as heaviness/pressure/tightness, is sharp, is worse with exertion and is relieved by nitroglycerin. The patient complains of nausea and reports some diaphoresis. The patient's chest pain is middle- or left-sided, is not well-localized and does not radiate to the arms/jaw/neck. The patient has no history of stroke, has no history of  peripheral artery disease, has not smoked in the past 90 days, denies any history of treated diabetes and has no relevant family history of coronary artery disease (first degree relative at less than age 17).   Past Medical History:  Diagnosis Date  . Anemia   . Arthritis    osteoarthritis - shoulder, knees & hips  . Asthma   . Coronary artery disease   . Depression   . Diabetes mellitus without complication (HCC)    no meds  . Hypertension    Dr. Orinda Kenner manages BP, pt. reports MD has not found a need for treatment   . Hypothyroidism   . Sleep difficulties    had sleep study -2009, prior to gastric surgery, told that there was not a need for f/u  . Thyroid disease   . Vitamin B 12 deficiency     Patient Active Problem List   Diagnosis Date Noted  . Primary osteoarthritis of right knee 06/28/2019  . B12 deficiency 05/31/2019  . Coronary artery disease of native artery of native heart with stable angina pectoris ([place]) 08/31/2018  . Chest pain with high risk for cardiac etiology 04/02/2018  . Hypercholesteremia   . Polymyalgia rheumatica ([place]) 03/16/2018  . CAD (coronary artery disease) 11/19/2017  . Anxiety 07/17/2017  . Osteopenia of left hip 07/17/2017  . Overactive bladder 09/03/2016  . Morbid obesity ([place]) 06/28/2015  . Hypercalcemia 12/29/2014  . Allergic rhinitis 07/20/2014  . History of gastric bypass 02/08/2014  . OA (osteoarthritis) of knee 10/05/2013  . Hyperlipidemia associated with type 2 diabetes mellitus ([place]) 01/05/2008  . CHEST WALL PAIN, ANTERIOR 10/09/2007  .  Hypothyroidism 09/09/2007  . Well controlled type 2 diabetes mellitus ([place]) 07/18/2006  . Major depression, recurrent, full remission ([place]) 07/18/2006  . Hypertension associated with diabetes ([place]) 07/18/2006  . Asthma 07/18/2006    Past Surgical History:  Procedure Laterality Date  . ABDOMINAL HYSTERECTOMY    . BARIATRIC SURGERY    . CARDIAC CATHETERIZATION     [facility]- 30 yrs. ago  .  CATARACT EXTRACTION, BILATERAL     late 2018  . LEFT HEART CATH AND CORONARY ANGIOGRAPHY N/A 04/02/2018   Procedure: LEFT HEART CATH AND CORONARY ANGIOGRAPHY;  Surgeon: Martinique, Peter M, MD;  Location: [facility];  Service: Cardiovascular;  Laterality: N/A;  . OOPHORECTOMY    . pantallor arthrodesis with rod placement left foot    . TONSILLECTOMY    . TOTAL KNEE ARTHROPLASTY Left 11/30/2018   Procedure: TOTAL KNEE ARTHROPLASTY;  Surgeon: Gaynelle Arabian, MD;  Location: [facility];  Service: Orthopedics;  Laterality: Left;  . TOTAL KNEE ARTHROPLASTY Right 06/28/2019   Procedure: TOTAL KNEE ARTHROPLASTY;  Surgeon: Gaynelle Arabian, MD;  Location: [facility];  Service: Orthopedics;  Laterality: Right;  32mn  . TOTAL SHOULDER ARTHROPLASTY Left 03/12/2012   Procedure: TOTAL SHOULDER ARTHROPLASTY;  Surgeon: KMarin Shutter MD;  [facility]  Service: Orthopedics;  Laterality: Left;     OB History   No obstetric history on file.     Family History  Problem Relation Age of Onset  . Stroke Mother   . Liver cancer Father   . Cancer Father        liver  . Breast cancer Neg Hx     Social History   Tobacco Use  . Smoking status: Former Smoker    Packs/day: 0.70    Years: 20.00    Pack years: 14.00    Quit date: 03/04/1978    Years since quitting: 41.5  . Smokeless tobacco: Never Used  Vaping Use  . Vaping Use: Never used  Substance Use Topics  . Alcohol use: Yes    Alcohol/week: 2.0 standard drinks    Types: 2 Glasses of wine per week    Comment: with dinner  or social rare -   . Drug use: No    Home Medications Prior to Admission medications   Medication Sig Start Date End Date Taking? Authorizing Provider  acetaminophen (TYLENOL) 500 MG tablet Take 1,000 mg by mouth every 8 (eight) hours as needed for mild pain or headache.    [provider]  albuterol (VENTOLIN HFA) 108 (90 Base) MCG/ACT inhaler Inhale 1-2 puffs into the lungs every 4 (four) hours as needed for  wheezing or shortness of breath. Patient not taking: Reported on 08/19/2019 08/18/19   HMarin Olp MD  amLODipine (NORVASC) 10 MG tablet TAKE 1 TABLET EVERY DAY 08/16/19   HMarin Olp MD  aspirin EC 81 MG tablet Take 81 mg by mouth daily. Swallow whole.    [provider]  Blood Glucose Monitoring Suppl (TRUE METRIX AIR GLUCOSE METER) w/Device KIT USE AS DIRECTED 03/31/18   HMarin Olp MD  Cholecalciferol (VITAMIN D-3) 125 MCG (5000 UT) TABS Take 5,000 Units by mouth daily.    [provider]  cyanocobalamin (,VITAMIN B-12,) 1000 MCG/ML injection Inject 1 mL (1,000 mcg total) into the muscle every 30 (thirty) days. Uses on amonthly basis 11/18/18   HMarin Olp MD  desvenlafaxine (PRISTIQ) 100 MG 24 hr tablet TAKE 1 TABLET EVERY DAY 08/16/19   HGarret Reddish  O, MD  diclofenac (VOLTAREN) 75 MG EC tablet Take 75 mg by mouth daily. 07/04/19   [provider]  Evolocumab (REPATHA SURECLICK) 751 MG/ML SOAJ Inject 1 pen into the skin every 14 (fourteen) days. 07/30/19   Belva Crome, MD  FeFum-FePo-FA-B Cmp-C-Zn-Mn-Cu (SE-TAN PLUS) 700-174.9-4 MG CAPS Take 1 capsule by mouth twice daily Patient taking differently: Take 1 capsule by mouth in the morning and at bedtime.  03/09/19   Marin Olp, MD  fluticasone [facility]) 50 MCG/ACT nasal spray USE 1 SPRAY IN [facility] NOSTRIL EVERY DAY 08/16/19   Marin Olp, MD  glucose blood test strip Test blood sugar once per day 05/27/17   Marin Olp, MD  Levothyroxine Sodium (TIROSINT) 150 MCG CAPS Take 300 mcg by mouth daily before breakfast.  09/18/17   [provider]  loperamide (IMODIUM) 2 MG capsule Take 4 mg by mouth daily as needed for diarrhea or loose stools.     [provider]  montelukast (SINGULAIR) 10 MG tablet TAKE 1 TABLET EVERY DAY WITH BREAKFAST 07/16/19   Marin Olp, MD  nitroGLYCERIN (NITROSTAT) 0.4 MG SL tablet Place 1 tablet (0.4 mg total) under the tongue every 5  (five) minutes as needed for chest pain. 09/29/17 11/18/19  Belva Crome, MD  nitroGLYCERIN (NITROSTAT) 0.4 MG SL tablet Place 1 tablet (0.4 mg total) under the tongue every 5 (five) minutes as needed for chest pain. 08/19/19   Marin Olp, MD  Omega-3 Fatty Acids (FISH OIL) 1000 MG CAPS Take 5,000 mg by mouth daily.     [provider]  predniSONE (DELTASONE) 5 MG tablet Take 4-5 pills daily for polymyalgia rheumatica 08/26/19   Marin Olp, MD  Propylene Glycol (SYSTANE COMPLETE OP) Apply 1 drop to eye 2 (two) times daily as needed (dryness).     [provider]  TRUEplus Lancets 30G MISC Check blood sugar once per day 05/11/18   Marin Olp, MD    Allergies    Flexeril [cyclobenzaprine], Ace inhibitors, Azithromycin, and Codeine  Review of Systems   Review of Systems  Constitutional: Positive for diaphoresis.  Respiratory: Positive for shortness of breath.   Cardiovascular: Positive for chest pain.  Gastrointestinal: Positive for nausea. Negative for vomiting.  Neurological: Negative for syncope.  All other systems reviewed and are negative.   Physical Exam Updated Vital Signs BP (!) 141/84 (BP Location: Right Arm)   Pulse 77   Temp 98.9 F (37.2 C) (Oral)   Resp 16   Ht _0  (1.549 m)   Wt 92.1 kg   SpO2 97%   BMI 38.36 kg/m   Physical Exam Vitals and nursing note reviewed.  Constitutional:      General: She is not in acute distress.    Appearance: She is well-developed.     Comments: Resting comfortably in bed  HENT:     Head: Normocephalic and atraumatic.  Eyes:     General:        Right eye: No discharge.        Left eye: No discharge.     Conjunctiva/sclera: Conjunctivae normal.  Neck:     Vascular: No JVD.     Trachea: No tracheal deviation.  Cardiovascular:     Rate and Rhythm: Normal rate and regular rhythm.     Pulses:          Radial pulses are 2+ on the right side and 2+ on the left side.  Dorsalis pedis pulses  are 2+ on the right side and 2+ on the left side.       Posterior tibial pulses are 2+ on the right side and 2+ on the left side.  Pulmonary:     Effort: Pulmonary effort is normal.  Abdominal:     General: There is no distension.     Palpations: Abdomen is soft.     Tenderness: There is no abdominal tenderness. There is no guarding or rebound.  Musculoskeletal:     Cervical back: Neck supple.     Right lower leg: No tenderness.     Left lower leg: No tenderness.  Skin:    General: Skin is warm and dry.     Findings: No erythema.  Neurological:     Mental Status: She is alert.  Psychiatric:        Behavior: Behavior normal.     ED Results / Procedures / Treatments   Labs (all labs ordered are listed, but only abnormal results are displayed) Labs Reviewed  BASIC METABOLIC PANEL - Abnormal; Notable for the following components:      Result Value   Sodium 134 (*)    Chloride 96 (*)    Glucose, Bld 199 (*)    GFR calc non Af Amer 58 (*)    All other components within normal limits  CBC - Abnormal; Notable for the following components:   WBC 10.7 (*)    All other components within normal limits  SARS CORONAVIRUS 2 BY RT PCR (HOSPITAL ORDER, [place] [facility])  TROPONIN I (HIGH SENSITIVITY)  TROPONIN I (HIGH SENSITIVITY)    EKG EKG Interpretation  Date/Time:  Sunday August 29 2019 16:16:34 EDT Ventricular Rate:  71 PR Interval:  154 QRS Duration: 92 QT Interval:  376 QTC Calculation: 408 R Axis:   61 Text Interpretation: Normal sinus rhythm Normal ECG Confirmed by Virgel Manifold [phone]) on 08/29/2019 6:35:54 PM   Radiology DG Chest 2 View  Result Date: 08/29/2019 CLINICAL DATA:  Chest pain EXAM: CHEST - 2 VIEW COMPARISON:  03/01/2019 FINDINGS: Aortic atherosclerosis. Heart is upper limits normal in size. No confluent opacities, effusions or edema. No acute bony abnormality. IMPRESSION: No active cardiopulmonary disease. Electronically Signed   By:  Rolm Baptise M.D.   On: 08/29/2019 16:39    Procedures Procedures (including critical care time)  Medications Ordered in ED Medications  sodium chloride flush (NS) 0.9 % injection 3 mL (has no administration in time range)    ED Course  I have reviewed the triage vital signs and the nursing notes.  Pertinent labs & imaging results that were available during my care of the patient were reviewed by me and considered in my medical decision making (see chart for details).    MDM Rules/Calculators/A&P HEAR Score: 7                        Patient presenting for evaluation of exertional chest pain that resolved after taking aspirin and 2 sublingual nitroglycerin.  She is currently chest pain-free.  She is afebrile, mildly hypertensive.  Vital signs otherwise stable.  She had a cardiac catheterization in 2020 which showed multivessel disease with no percutaneous intervention.  Her EKG shows normal sinus rhythm, T wave inversions in lead III but otherwise no acute ischemic abnormalities.  Lab work reviewed by me shows nonspecific leukocytosis, could be in the setting of prednisone use due to her polymyalgia rheumatica.  No anemia, no renal insufficiency, no metabolic derangements.  Initial troponin is negative. Her HEART score is 7.  History is concerning and she is high risk for cardiac disease.  At this time doubt PE, dissection, cardiac tamponade, esophageal rupture, pneumonia or pneumothorax.  7:58PM CONSULT: Spoke with Dr. Vickki Muff with on call cardiology service.  He recommends hospitalist admission with cardiology consultation.  Spoke with Dr. Alcario Drought with [facility] hospitalist service who agrees to assume care of patient and bring her into the hospital for further evaluation and management.  9:04 PM Dr. Fabio Neighbors has seen and evaluated the patient.  She is hesitant to be admitted at this time and would like to wait until her second troponin results.  Signed out care to oncoming provider PA Caccavale  he to follow-up on results of second troponin and reassess the patient.  It sounds like she would be willing to be admitted if her troponin is elevated or if her chest pain recurs.  Ultimately our recommendation for admission stands given her high risk history and catheterization which shows stenosis in multiple coronary arteries in 2020.  Final Clinical Impression(s) / ED Diagnoses Final diagnoses:  Exertional chest pain    Rx / DC Orders ED Discharge Orders    None       Renita Papa, PA-C 08/29/19 2101    Debroah Baller 08/29/19 2107    Virgel Manifold, MD 08/30/19 [phone]

## 2019-08-29 NOTE — ED Provider Notes (Signed)
  Physical Exam  BP (!) 188/86   Pulse (!) 59   Temp 98.9 F (37.2 C) (Oral)   Resp 12   Ht 5\' 1"  (1.549 m)   Wt 92.1 kg   SpO2 98%   BMI 38.36 kg/m   Physical Exam  Gen: nontoxic Cv: RRR  ED Course/Procedures     Procedures  MDM   Pt signed out to me by Amada Kingfisher, PA-C.  Please see previous notes for further history.  In brief, patient presented for evaluation of chest pain.  Began around 3 this afternoon.  Associated nausea, shortness breath, diaphoresis.  Pain resolved with 2 sublingual nitro.  Most recent cath in March 2020 showed multivessel disease with up to 75% stenosis, medical management at that time.  She follows with Dr. Tamala Julian.  Patient chest pain-free in the ED.  Initial work-up reassuring with a negative troponin of 7.  Patient would like to wait for her delta troponin to come back prior to deciding whether or not she wants to be admitted.  Repeat troponin stable at 7.  However due to concerning story and multivessel disease on most recent cath, still recommend admission.  Patient is agreeable at this time.  Discussed with Dr. Alcario Drought, patient to be admitted.       Franchot Heidelberg, PA-C 08/29/19 2302    Virgel Manifold, MD 08/30/19 [phone]

## 2019-08-29 NOTE — ED Notes (Signed)
Admitting provider at bedside.

## 2019-08-29 NOTE — H&P (Signed)
History and Physical    ANNASTACIA [name] XFG:[id number] DOB: [birth year] DOA: 08/29/2019  PCP: Marin Olp, MD  Patient coming from: Home  I have personally briefly reviewed patient's old medical records in [place]  Chief Complaint: CP  HPI: [name] is a 79 y.o. female with medical history significant of CAD, DM2, HTN.  Pt presents to ED for evaluation of CP.  Symptoms onset suddenly at 305pm while bringing groceries into home.  Sharp, substernal pressure, radiated to R side a little.  Associated nausea, SOB.  No vomiting, no syncope.  Took full ASA and 2 sl NTG with complete resolution of symptoms.  Lasted 10-15 mins overall.  Former smoker.  LHC in 03/2018 with multivessel dz up to 75% stenosis, not felt to need intervention at that time.   ED Course: Trop neg X2.  EDP spoke with cards, recd hospitalist admit with cards consult.   Review of Systems: As per HPI, otherwise all review of systems negative.  Past Medical History:  Diagnosis Date  . Anemia   . Arthritis    osteoarthritis - shoulder, knees & hips  . Asthma   . Coronary artery disease   . Depression   . Diabetes mellitus without complication (HCC)    no meds  . Hypertension    Dr. Orinda Kenner manages BP, pt. reports MD has not found a need for treatment   . Hypothyroidism   . Sleep difficulties    had sleep study -2009, prior to gastric surgery, told that there was not a need for f/u  . Thyroid disease   . Vitamin B 12 deficiency     Past Surgical History:  Procedure Laterality Date  . ABDOMINAL HYSTERECTOMY    . BARIATRIC SURGERY    . CARDIAC CATHETERIZATION     [facility]- 30 yrs. ago  . CATARACT EXTRACTION, BILATERAL     late 2018  . LEFT HEART CATH AND CORONARY ANGIOGRAPHY N/A 04/02/2018   Procedure: LEFT HEART CATH AND CORONARY ANGIOGRAPHY;  Surgeon: Martinique, Peter M, MD;  Location: [facility];  Service: Cardiovascular;  Laterality: N/A;  . OOPHORECTOMY    . pantallor  arthrodesis with rod placement left foot    . TONSILLECTOMY    . TOTAL KNEE ARTHROPLASTY Left 11/30/2018   Procedure: TOTAL KNEE ARTHROPLASTY;  Surgeon: Gaynelle Arabian, MD;  Location: [facility];  Service: Orthopedics;  Laterality: Left;  . TOTAL KNEE ARTHROPLASTY Right 06/28/2019   Procedure: TOTAL KNEE ARTHROPLASTY;  Surgeon: Gaynelle Arabian, MD;  Location: [facility];  Service: Orthopedics;  Laterality: Right;  21mn  . TOTAL SHOULDER ARTHROPLASTY Left 03/12/2012   Procedure: TOTAL SHOULDER ARTHROPLASTY;  Surgeon: KMarin Shutter MD;  Location: [facility]  Service: Orthopedics;  Laterality: Left;     reports that she quit smoking about 41 years ago. She has a 14.00 pack-year smoking history. She has never used smokeless tobacco. She reports current alcohol use of about 2.0 standard drinks of alcohol per week. She reports that she does not use drugs.  Allergies  Allergen Reactions  . Flexeril [Cyclobenzaprine] Other (See Comments)    On Pristiq- possible serotonin reaction to flexeril  . Ace Inhibitors Other (See Comments)    States after a surgery she was told to "not take" this class of drugs- not 100% sure what the reason was  . Codeine Nausea Only and Other (See Comments)    Upset stomach  . Singulair [Montelukast] Other (See Comments)    Causes  depression  . Azithromycin Rash    Family History  Problem Relation Age of Onset  . Stroke Mother   . Liver cancer Father   . Cancer Father        liver  . Breast cancer Neg Hx      Prior to Admission medications   Medication Sig Start Date End Date Taking? Authorizing Provider  acetaminophen (TYLENOL) 500 MG tablet Take 1,000 mg by mouth every 8 (eight) hours as needed for mild pain or headache.   Yes [provider]  albuterol (VENTOLIN HFA) 108 (90 Base) MCG/ACT inhaler Inhale 1-2 puffs into the lungs every 4 (four) hours as needed for wheezing or shortness of breath. 08/18/19  Yes Marin Olp, MD  amLODipine (NORVASC) 10 MG  tablet TAKE 1 TABLET EVERY DAY Patient taking differently: Take 10 mg by mouth at bedtime.  08/16/19  Yes Marin Olp, MD  aspirin 325 MG EC tablet Take 325 mg by mouth once as needed (for onset of chest pain).   Yes [provider]  aspirin EC 81 MG tablet Take 81 mg by mouth daily. Swallow whole.   Yes [provider]  Cholecalciferol (VITAMIN D-3) 125 MCG (5000 UT) TABS Take 5,000 Units by mouth daily.   Yes [provider]  desvenlafaxine (PRISTIQ) 100 MG 24 hr tablet TAKE 1 TABLET EVERY DAY Patient taking differently: Take 100 mg by mouth daily.  08/16/19  Yes Marin Olp, MD  Evolocumab (REPATHA SURECLICK) 631 MG/ML SOAJ Inject 1 pen into the skin every 14 (fourteen) days. Patient taking differently: Inject 140 mg into the skin every 14 (fourteen) days.  07/30/19  Yes Belva Crome, MD  FeFum-FePo-FA-B Cmp-C-Zn-Mn-Cu (SE-TAN PLUS) 497-026.3-7 MG CAPS Take 1 capsule by mouth twice daily Patient taking differently: Take 1 capsule by mouth daily.  03/09/19  Yes Marin Olp, MD  fexofenadine (ALLEGRA) 180 MG tablet Take 180 mg by mouth daily as needed for allergies or rhinitis.   Yes [provider]  fluticasone (FLONASE) 50 MCG/ACT nasal spray USE 1 SPRAY IN EACH NOSTRIL EVERY DAY Patient taking differently: Place 2 sprays into both nostrils daily.  08/16/19  Yes Marin Olp, MD  Levothyroxine Sodium (TIROSINT) 150 MCG CAPS Take 300 mcg by mouth daily before breakfast.  09/18/17  Yes [provider]  loperamide (IMODIUM) 2 MG capsule Take 4 mg by mouth daily as needed for diarrhea or loose stools.    Yes [provider]  nitroGLYCERIN (NITROSTAT) 0.4 MG SL tablet Place 1 tablet (0.4 mg total) under the tongue every 5 (five) minutes as needed for chest pain. 08/19/19  Yes Marin Olp, MD  Omega-3 Fatty Acids (FISH OIL) 1000 MG CAPS Take 1,000 mg by mouth daily.    Yes [provider]  predniSONE (DELTASONE) 5 MG  tablet Take 4-5 pills daily for polymyalgia rheumatica Patient taking differently: Take 25 mg by mouth daily with breakfast.  08/26/19  Yes Marin Olp, MD  Propylene Glycol (SYSTANE COMPLETE OP) Place 1 drop into both eyes in the morning.    Yes [provider]  Blood Glucose Monitoring Suppl (TRUE METRIX AIR GLUCOSE METER) w/Device KIT USE AS DIRECTED 03/31/18   Marin Olp, MD  cyanocobalamin (,VITAMIN B-12,) 1000 MCG/ML injection Inject 1 mL (1,000 mcg total) into the muscle every 30 (thirty) days. Uses on amonthly basis Patient taking differently: Inject 1,000 mcg into the muscle every 30 (thirty) days.  11/18/18  Marin Olp, MD  diclofenac (VOLTAREN) 75 MG EC tablet Take 75 mg by mouth daily. 07/04/19   [provider]  glucose blood test strip Test blood sugar once per day 05/27/17   Marin Olp, MD  montelukast (SINGULAIR) 10 MG tablet TAKE 1 TABLET EVERY DAY WITH BREAKFAST Patient taking differently: Take 10 mg by mouth daily.  07/16/19   Marin Olp, MD  TRUEplus Lancets 30G MISC Check blood sugar once per day 05/11/18   Marin Olp, MD    Physical Exam: Vitals:   08/29/19 1611 08/29/19 1612 08/29/19 2106  BP: (!) 141/84  (!) 188/86  Pulse: 77  (!) 59  Resp: 16  12  Temp: 98.9 F (37.2 C)    TempSrc: Oral    SpO2: 97%  98%  Weight:  92.1 kg   Height:  _0  (1.549 m)     Constitutional: NAD, calm, comfortable Eyes: PERRL, lids and conjunctivae normal ENMT: Mucous membranes are moist. Posterior pharynx clear of any exudate or lesions.Normal dentition.  Neck: normal, supple, no masses, no thyromegaly Respiratory: clear to auscultation bilaterally, no wheezing, no crackles. Normal respiratory effort. No accessory muscle use.  Cardiovascular: Regular rate and rhythm, no murmurs / rubs / gallops. No extremity edema. 2+ pedal pulses. No carotid bruits.  Abdomen: no tenderness, no masses palpated. No hepatosplenomegaly. Bowel sounds  positive.  Musculoskeletal: no clubbing / cyanosis. No joint deformity upper and lower extremities. Good ROM, no contractures. Normal muscle tone.  Skin: no rashes, lesions, ulcers. No induration Neurologic: CN 2-12 grossly intact. Sensation intact, DTR normal. Strength 5/5 in all 4.  Psychiatric: Normal judgment and insight. Alert and oriented x 3. Normal mood.    Labs on Admission: I have personally reviewed following labs and imaging studies  CBC: Recent Labs  Lab 08/29/19 1611  WBC 10.7*  HGB 12.7  HCT 41.1  MCV 91.5  PLT 409   Basic Metabolic Panel: Recent Labs  Lab 08/29/19 1611  NA 134*  K 4.6  CL 96*  CO2 26  GLUCOSE 199*  BUN 17  CREATININE 0.94  CALCIUM 10.1   GFR: Estimated Creatinine Clearance: 50.2 mL/min (by C-G formula based on SCr of 0.94 mg/dL). Liver Function Tests: No results for input(s): AST, ALT, ALKPHOS, BILITOT, PROT, ALBUMIN in the last 168 hours. No results for input(s): LIPASE, AMYLASE in the last 168 hours. No results for input(s): AMMONIA in the last 168 hours. Coagulation Profile: No results for input(s): INR, PROTIME in the last 168 hours. Cardiac Enzymes: No results for input(s): CKTOTAL, CKMB, CKMBINDEX, TROPONINI in the last 168 hours. BNP (last 3 results) No results for input(s): PROBNP in the last 8760 hours. HbA1C: No results for input(s): HGBA1C in the last 72 hours. CBG: No results for input(s): GLUCAP in the last 168 hours. Lipid Profile: No results for input(s): CHOL, HDL, LDLCALC, TRIG, CHOLHDL, LDLDIRECT in the last 72 hours. Thyroid Function Tests: No results for input(s): TSH, T4TOTAL, FREET4, T3FREE, THYROIDAB in the last 72 hours. Anemia Panel: No results for input(s): VITAMINB12, FOLATE, FERRITIN, TIBC, IRON, RETICCTPCT in the last 72 hours. Urine analysis:    Component Value Date/Time   COLORURINE YELLOW 09/03/2016 1048   APPEARANCEUR CLEAR 09/03/2016 1048   LABSPEC 1.015 09/03/2016 1048   PHURINE 6.0  09/03/2016 1048   GLUCOSEU NEGATIVE 09/03/2016 1048   HGBUR NEGATIVE 09/03/2016 1048   HGBUR negative 10/01/2007 [place] 09/03/2016 1048   BILIRUBINUR n 11/03/2015 1639  KETONESUR NEGATIVE 09/03/2016 1048   PROTEINUR n 11/03/2015 1639   UROBILINOGEN 0.2 09/03/2016 1048   NITRITE NEGATIVE 09/03/2016 1048   LEUKOCYTESUR NEGATIVE 09/03/2016 1048    Radiological Exams on Admission: DG Chest 2 View  Result Date: 08/29/2019 CLINICAL DATA:  Chest pain EXAM: CHEST - 2 VIEW COMPARISON:  03/01/2019 FINDINGS: Aortic atherosclerosis. Heart is upper limits normal in size. No confluent opacities, effusions or edema. No acute bony abnormality. IMPRESSION: No active cardiopulmonary disease. Electronically Signed   By: Rolm Baptise M.D.   On: 08/29/2019 16:39    EKG: Independently reviewed.  Assessment/Plan Principal Problem:   Chest pain, rule out acute myocardial infarction Active Problems:   Well controlled type 2 diabetes mellitus ([place])   Hypertension associated with diabetes ([place])   CAD (coronary artery disease)    1. CP r/o - 1. CP obs pathway 2. Serial trops - neg 3. Tele monitor 4. No CP at this time nor since arrival to ED 5. NPO 6. Cards consulted by EDP 2. HTN - 1. Cont home BP meds 3. CAD - 1. Cont ASA 4. DM2 - 1. On no home meds 2. Sensitive SSI while here 5. PMR - 1. Cont daily prednisone 2. Also on biologic, last dose was earlier this week.  DVT prophylaxis: Lovenox Code Status: Full Family Communication: Daughter at bedside Disposition Plan: Home after ACS r/o Consults called: EDP spoke with cardiology Admission status: Place in 59    Sneijder Bernards, [place] Hospitalists  How to contact the [facility] Attending or Consulting provider [place] or covering provider during after hours [place], for this patient?  1. Check the care team in [facility] and look for a) attending/consulting TR[facility] provider listed and b) the [facility] team listed 2. Log into  [email or web address]  Amion Physician Scheduling and messaging for groups and whole hospitals  On call and physician scheduling software for group practices, residents, hospitalists and other medical providers for call, clinic, rotation and shift schedules. OnCall Enterprise is a hospital-wide system for scheduling doctors and paging doctors on call. EasyPlot is for scientific plotting and data analysis.  [email or web address]  and use [place]'s universal password to access. If you do not have the password, please contact the hospital operator.  3. Locate the [facility] provider you are looking for under [facility] and page to a number that you can be directly reached. 4. If you still have difficulty reaching the provider, please page the [facility] (Director on Call) for the Hospitalists listed on amion for assistance.  08/29/2019, 10:47 PM

## 2019-08-29 NOTE — ED Triage Notes (Signed)
Pt present with CP. She took 2 NTG and 325mg  ASA PTA. CP free on arrival to ED. No SOB, diaphoresis.  20G LH 146/86 HR 72 rr 16 spo2 98% RA

## 2019-08-30 ENCOUNTER — Observation Stay (HOSPITAL_BASED_OUTPATIENT_CLINIC_OR_DEPARTMENT_OTHER): Payer: Medicare HMO

## 2019-08-30 ENCOUNTER — Telehealth: Payer: Self-pay | Admitting: Family Medicine

## 2019-08-30 DIAGNOSIS — I10 Essential (primary) hypertension: Secondary | ICD-10-CM | POA: Diagnosis not present

## 2019-08-30 DIAGNOSIS — I25110 Atherosclerotic heart disease of native coronary artery with unstable angina pectoris: Secondary | ICD-10-CM

## 2019-08-30 DIAGNOSIS — E1159 Type 2 diabetes mellitus with other circulatory complications: Secondary | ICD-10-CM

## 2019-08-30 DIAGNOSIS — R079 Chest pain, unspecified: Secondary | ICD-10-CM | POA: Diagnosis not present

## 2019-08-30 LAB — GLUCOSE, CAPILLARY
Glucose-Capillary: 89 mg/dL (ref 70–99)
Glucose-Capillary: 103 mg/dL — ABNORMAL HIGH (ref 70–99)
Glucose-Capillary: 117 mg/dL — ABNORMAL HIGH (ref 70–99)
Glucose-Capillary: 154 mg/dL — ABNORMAL HIGH (ref 70–99)

## 2019-08-30 LAB — NM MYOCAR MULTI W/SPECT W/WALL MOTION / EF
Peak HR: 70 {beats}/min
Rest HR: 57 {beats}/min
TID: 1.14

## 2019-08-30 LAB — LIPID PANEL
Cholesterol: 150 mg/dL (ref 0–200)
HDL: 82 mg/dL (ref >40)
LDL Cholesterol: 49 mg/dL (ref 0–99)
Total CHOL/HDL Ratio: 1.8 RATIO
Triglycerides: 96 mg/dL (ref <150)
VLDL: 19 mg/dL (ref 0–40)

## 2019-08-30 LAB — CBG MONITORING, ED: Glucose-Capillary: 169 mg/dL — ABNORMAL HIGH (ref 70–99)

## 2019-08-30 LAB — HEMOGLOBIN A1C
Hgb A1c MFr Bld: 6.6 % — ABNORMAL HIGH (ref 4.8–5.6)
Mean Plasma Glucose: 142.72 mg/dL

## 2019-08-30 LAB — SARS CORONAVIRUS 2 BY RT PCR (HOSPITAL ORDER, PERFORMED IN ~~LOC~~ HOSPITAL LAB): SARS Coronavirus 2: NEGATIVE

## 2019-08-30 MED ORDER — POLYVINYL ALCOHOL 1.4 % OP SOLN
1.0000 [drp] | Freq: Every day | OPHTHALMIC | Status: DC
  Filled 2019-08-30: qty 15

## 2019-08-30 MED ORDER — REGADENOSON 0.4 MG/5ML IV SOLN
INTRAVENOUS | Status: AC
  Administered 2019-08-30: 0.4 mg via INTRAVENOUS
  Filled 2019-08-30: qty 5

## 2019-08-30 MED ORDER — MELATONIN 5 MG PO TABS
5.0000 mg | ORAL_TABLET | Freq: Every day | ORAL | Status: DC
  Filled 2019-08-30 (×2): qty 1

## 2019-08-30 MED ORDER — INSULIN ASPART 100 UNIT/ML ~~LOC~~ SOLN: 0.0000 [IU] | Freq: Three times a day (TID) | SUBCUTANEOUS | Status: DC

## 2019-08-30 MED ORDER — TECHNETIUM TC 99M TETROFOSMIN IV KIT
30.6000 | PACK | Freq: Once | INTRAVENOUS | Status: AC | PRN
  Administered 2019-08-30: 30.6 via INTRAVENOUS

## 2019-08-30 MED ORDER — TECHNETIUM TC 99M TETROFOSMIN IV KIT
9.9000 | PACK | Freq: Once | INTRAVENOUS | Status: AC | PRN
  Administered 2019-08-30: 9.9 via INTRAVENOUS

## 2019-08-30 MED ORDER — REGADENOSON 0.4 MG/5ML IV SOLN
0.4000 mg | Freq: Once | INTRAVENOUS | Status: AC
  Filled 2019-08-30: qty 5

## 2019-08-30 NOTE — Progress Notes (Signed)
DAILY PROGRESS NOTE   Patient Name: [name] Date of Encounter: 08/30/2019 Cardiologist: Sinclair Grooms, MD  Chief Complaint   No chest pain overnight  Patient Profile   79 yo female with known multivessel moderate CAD by cath in 2020, presents with chest pain. Troponins negative.  Subjective   No chest pain overnight. Trops negative. LDL 49. A1c 6.6.  Objective   Vitals:   08/30/19 0205 08/30/19 0233 08/30/19 0234 08/30/19 0555  BP: 140/89 (!) 159/71  (!) 143/73  Pulse: (!) 55 (!) 56  (!) 55  Resp: 12 16  16   Temp: 98 F (36.7 C) 97.7 F (36.5 C)  97.8 F (36.6 C)  TempSrc: Oral Oral  Oral  SpO2: 97% 99%  97%  Weight:   99.3 kg   Height:        Intake/Output Summary (Last 24 hours) at 08/30/2019 1003 Last data filed at 08/30/2019 0800 Gross per 24 hour  Intake 0 ml  Output --  Net 0 ml   Filed Weights   08/29/19 1612 08/30/19 0234  Weight: 92.1 kg 99.3 kg    Physical Exam   General appearance: alert, no distress and moderately obese Neck: no carotid bruit, no JVD and thyroid not enlarged, symmetric, no tenderness/mass/nodules Lungs: clear to auscultation bilaterally Heart: regular rate and rhythm, S1, S2 normal, no murmur, click, rub or gallop Abdomen: soft, non-tender; bowel sounds normal; no masses,  no organomegaly Extremities: extremities normal, atraumatic, no cyanosis or edema Pulses: 2+ and symmetric Skin: Skin color, texture, turgor normal. No rashes or lesions Neurologic: Grossly normal Psych: Pleasant  Inpatient Medications    Scheduled Meds: . amLODipine  10 mg Oral QHS  . aspirin EC  81 mg Oral Daily  . desvenlafaxine  100 mg Oral Daily  . enoxaparin (LOVENOX) injection  40 mg Subcutaneous QHS  . fluticasone  2 spray Each Nare Daily  . insulin aspart  0-9 Units Subcutaneous Q4H  . levothyroxine  300 mcg Oral Q0600  . melatonin  5 mg Oral QHS  . polyvinyl alcohol  1 drop Both Eyes Daily  . predniSONE  25 mg Oral Q  breakfast  . sodium chloride flush  3 mL Intravenous Once    Continuous Infusions:   PRN Meds: acetaminophen, albuterol, nitroGLYCERIN, ondansetron (ZOFRAN) IV   Labs   Results for orders placed or performed during the hospital encounter of 08/29/19 (from the past 48 hour(s))  Basic metabolic panel     Status: Abnormal   Collection Time: 08/29/19  4:11 PM  Result Value Ref Range   Sodium 134 (L) 135 - 145 mmol/L   Potassium 4.6 3.5 - 5.1 mmol/L   Chloride 96 (L) 98 - 111 mmol/L   CO2 26 22 - 32 mmol/L   Glucose, Bld 199 (H) 70 - 99 mg/dL    Comment: Glucose reference range applies only to samples taken after fasting for at least 8 hours.   BUN 17 8 - 23 mg/dL   Creatinine, Ser 0.94 0.44 - 1.00 mg/dL   Calcium 10.1 8.9 - 10.3 mg/dL   GFR calc non Af Amer 58 (L) >60 mL/min   GFR calc Af Amer >60 >60 mL/min   Anion gap 12 5 - 15    Comment: Performed at [facility] [address] [place], [place] [address]  CBC     Status: Abnormal   Collection Time: 08/29/19  4:11 PM  Result Value Ref Range  WBC 10.7 (H) 4.0 - 10.5 K/uL   RBC 4.49 3.87 - 5.11 MIL/uL   Hemoglobin 12.7 12.0 - 15.0 g/dL   HCT 41.1 36 - 46 %   MCV 91.5 80.0 - 100.0 fL   MCH 28.3 26.0 - 34.0 pg   MCHC 30.9 30.0 - 36.0 g/dL   RDW 14.5 11.5 - 15.5 %   Platelets 251 150 - 400 K/uL   nRBC 0.0 0.0 - 0.2 %    Comment: Performed at [facility] [address], [place], [place] [address]  Troponin I (High Sensitivity)     Status: None   Collection Time: 08/29/19  4:11 PM  Result Value Ref Range   Troponin I (High Sensitivity) 7 <18 ng/L    Comment: (NOTE) Elevated high sensitivity troponin I (hsTnI) values and significant  changes across serial measurements may suggest ACS but many other  chronic and acute conditions are known to elevate hsTnI results.  Refer to the "Links" section for chest pain algorithms and additional  guidance. Performed at [facility], [place] [address]  [place], [place] [address]   Troponin I (High Sensitivity)     Status: None   Collection Time: 08/29/19  9:22 PM  Result Value Ref Range   Troponin I (High Sensitivity) 7 <18 ng/L    Comment: (NOTE) Elevated high sensitivity troponin I (hsTnI) values and significant  changes across serial measurements may suggest ACS but many other  chronic and acute conditions are known to elevate hsTnI results.  Refer to the "Links" section for chest pain algorithms and additional  guidance. Performed at [facility], [place] [address] [place], [place] [address]   CBG monitoring, ED     Status: Abnormal   Collection Time: 08/30/19 12:00 AM  Result Value Ref Range   Glucose-Capillary 169 (H) 70 - 99 mg/dL    Comment: Glucose reference range applies only to samples taken after fasting for at least 8 hours.  SARS Coronavirus 2 by RT PCR (hospital order, performed in [facility] hospital lab) Nasopharyngeal Nasopharyngeal Swab     Status: None   Collection Time: 08/30/19 12:23 AM   Specimen: Nasopharyngeal Swab  Result Value Ref Range   SARS Coronavirus 2 NEGATIVE NEGATIVE    Comment: (NOTE) SARS-CoV-2 target nucleic acids are NOT DETECTED.  The SARS-CoV-2 RNA is generally detectable in upper and lower respiratory specimens during the acute phase of infection. The lowest concentration of SARS-CoV-2 viral copies this assay can detect is 250 copies / mL. A negative result does not preclude SARS-CoV-2 infection and should not be used as the sole basis for treatment or other patient management decisions.  A negative result may occur with improper specimen collection / handling, submission of specimen other than nasopharyngeal swab, presence of viral mutation(s) within the areas targeted by this assay, and inadequate number of viral copies (<250 copies / mL). A negative result must be combined with clinical observations, patient history, and epidemiological information.  Fact Sheet for Patients:    StrictlyIdeas.no  Fact Sheet for Healthcare Providers: BankingDealers.co.za  This test is not yet approved or  cleared by the [place] FDA and has been authorized for detection and/or diagnosis of SARS-CoV-2 by FDA under an Emergency Use Authorization (EUA).  This EUA will remain in effect (meaning this test can be used) for the duration of the COVID-19 declaration under Section 564(b)(1) of the Act, 21 U.S.C. section 360bbb-3(b)(1), unless the authorization is terminated or revoked  sooner.  Performed at [facility], [place] [address] [place], [place] [address]   Glucose, capillary     Status: None   Collection Time: 08/30/19  5:57 AM  Result Value Ref Range   Glucose-Capillary 89 70 - 99 mg/dL    Comment: Glucose reference range applies only to samples taken after fasting for at least 8 hours.  Lipid panel     Status: None   Collection Time: 08/30/19  6:24 AM  Result Value Ref Range   Cholesterol 150 0 - 200 mg/dL   Triglycerides 96 <150 mg/dL   HDL 82 >40 mg/dL   Total CHOL/HDL Ratio 1.8 RATIO   VLDL 19 0 - 40 mg/dL   LDL Cholesterol 49 0 - 99 mg/dL    Comment:        Total Cholesterol/HDL:CHD Risk Coronary Heart Disease Risk Table                     Men   Women  1/2 Average Risk   3.4   3.3  Average Risk       5.0   4.4  2 X Average Risk   9.6   7.1  3 X Average Risk  23.4   11.0        Use the calculated Patient Ratio above and the CHD Risk Table to determine the patient's CHD Risk.        ATP III CLASSIFICATION (LDL):  <100     mg/dL   Optimal  100-129  mg/dL   Near or Above                    Optimal  130-159  mg/dL   Borderline  160-189  mg/dL   High  >190     mg/dL   Very High Performed at [facility] [address], [place], [place] [address]   Hemoglobin A1c     Status: Abnormal   Collection Time: 08/30/19  6:24 AM  Result Value Ref Range   Hgb A1c MFr Bld 6.6 (H) 4.8 - 5.6 %     Comment: (NOTE) Pre diabetes:          5.7%-6.4%  Diabetes:              >6.4%  Glycemic control for   <7.0% adults with diabetes    Mean Plasma Glucose 142.72 mg/dL    Comment: Performed at [facility] [address], [place], [place] [address]  Glucose, capillary     Status: Abnormal   Collection Time: 08/30/19  8:06 AM  Result Value Ref Range   Glucose-Capillary 103 (H) 70 - 99 mg/dL    Comment: Glucose reference range applies only to samples taken after fasting for at least 8 hours.    ECG   NSR at 71 - Personally Reviewed  Telemetry   Sinus rhythm - Personally Reviewed  Radiology    DG Chest 2 View  Result Date: 08/29/2019 CLINICAL DATA:  Chest pain EXAM: CHEST - 2 VIEW COMPARISON:  03/01/2019 FINDINGS: Aortic atherosclerosis. Heart is upper limits normal in size. No confluent opacities, effusions or edema. No acute bony abnormality. IMPRESSION: No active cardiopulmonary disease. Electronically Signed   By: Rolm Baptise M.D.   On: 08/29/2019 16:39    Cardiac Studies   Myoview pending  Assessment   1. Principal Problem: 2.   Chest pain, rule out acute myocardial infarction 3. Active Problems: 4.  Well controlled type 2 diabetes mellitus ([place]) 5.   Hypertension associated with diabetes ([place]) 6.   CAD (coronary artery disease) 7.   Plan   1. No further chest pain. Trops negative. Known moderate non-obstructive CAD. Will plan myoview stress test today, has been NPO. Dispo pending these results.  Time Spent Directly with Patient:  I have spent a total of 25 minutes with the patient reviewing hospital notes, telemetry, EKGs, labs and examining the patient as well as establishing an assessment and plan that was discussed personally with the patient.  > 50% of time was spent in direct patient care.  Length of Stay:  LOS: 0 days   Pixie Casino, MD, [facility], [place] Director of the Advanced Lipid Disorders &    Cardiovascular Risk Reduction Clinic Diplomate of the American Board of Clinical Lipidology Attending Cardiologist  Direct Dial: [phone]  Fax: [phone]  Website:  [email or web address] [email or web address] Jamarian Jacinto 08/30/2019, 10:03 AM

## 2019-08-30 NOTE — Progress Notes (Signed)
Patient off floor to Nuc. Med.

## 2019-08-30 NOTE — Progress Notes (Signed)
Discharge teaching complete. Meds, diet, activity, follow up appointments reviewed and all questions answered. Copy of instructions given to patient.  

## 2019-08-30 NOTE — Progress Notes (Signed)
  Chronic Care Management   Outreach Note  08/30/2019 Name: [name] MRN: [id number] DOB: 22-Jun-1940  Referred by: Marin Olp, MD Reason for referral : No chief complaint on file.   An unsuccessful telephone outreach was attempted today. The patient was referred to the pharmacist for assistance with care management and care coordination.   Follow Up Plan:   Earney [place] Upstream Scheduler

## 2019-08-30 NOTE — Progress Notes (Addendum)
   [name] presented for a nuclear stress test today.  No immediate complications.  Stress imaging is pending at this time.  Preliminary EKG findings may be listed in the chart, but the stress test result will not be finalized until perfusion imaging is complete.  Charlie Pitter, PA-C 08/30/2019, 2:14 PM    Addendum: result relayed to Dr. Debara Pickett (low risk). He indicates he already spoke with patient and hospitalist about result, and OK for discharge. Dr. Debara Pickett reports he sent a message to the office to schedule her a follow-up. Letrice Pollok PA-C

## 2019-08-30 NOTE — ED Notes (Signed)
This RN went to administer requested sleep aid (melatonin), pt said she'd prefer 1000mg  Tylenol. This RN came in with PRN 650mg  Tylenol as well as nightly medications, administered nightly medications & told pt that available Tylenol was 650mg . Pt took pills & then requested additional Tylenol. This RN advised that additional Tylenol could be administered in 4hrs. Pt voiced dissatisfaction, saying that staff in this hospital was incompetent. This RN asked that pt be respectful, that staff was only trying to aid pt in feeling better. This RN then lowered [facility] & wished pt a restful night sleep. Pt then said she wished this RN to "go to hell." This RN then again asked pt not to be disrespectful & pt called this RN a bitch. Pt's light turned off for comfort, call light within reach. No additional requests at this time

## 2019-08-30 NOTE — Discharge Summary (Signed)
Physician Discharge Summary  [name] XBJ:[id number] DOB: March 03, 1940 DOA: 08/29/2019  PCP: Marin Olp, MD  Admit date: 08/29/2019 Discharge date: 08/30/2019  Admitted From: Home Disposition: Home  Recommendations for Outpatient Follow-up:  1. Follow up with PCP in 1-2 weeks 2. Follow-up with cardiology, Dr. Tamala Julian in 2-3 weeks  Home Health: No Equipment/Devices: None  Discharge Condition: Stable CODE STATUS: Full code Diet recommendation: Heart healthy/consistent carbohydrate diet  History of present illness: [name] is a 79 year old female with past medical history notable for CAD, DM 2, essential hypertension who presents with chief complaint of chest pain.  States sudden onset of symptoms at 3:05 PM on 08/29/2019 while she was bringing in her groceries.  Pain is described as a sharp/substernal pressure with radiation to her right side associate with nausea and shortness of breath.  Patient took a full dose aspirin 325 mg and 2 sublingual nitroglycerin tabs with complete resolution of symptoms.  Patient reported chest pain lasted roughly 10-15 minutes.  With history of previous tobacco use.  Left heart catheterization March 2020 with multivessel disease up to 75% stenosis, not felt to need intervention at that time.  In the ED, troponins were negative x2.  ED physician discussed with cardiology who recommended hospitalist admission and they will consult.  Hospital course:  Atypical chest pain Patient presenting with acute onset chest pain during exertion with associated shortness of breath.  Chest x-ray with no acute cardiopulmonary disease process.  Troponin VII, 7 within normal limits.  EKG with normal sinus rhythm, rate 71, QTc 408 with no ST elevation or depression and no T wave inversion.  Patient was seen by cardiology and underwent Myoview stress test which showed no inducible ischemia with a normal LVEF.  No other recommendations per cardiology and recommend  follow-up with her primary cardiologist, Dr. Tamala Julian in 2-3 weeks.  Essential hypertension Continue amlodipine 10 mg p.o. daily  Hx CAD Recent left heart catheterization March 2020 with multivessel disease; no intervention performed at that time.  Lipid panel, total cholesterol 150, HDL 82, LDL 49, triglycerides 96.  Not on statin.  Continue aspirin.  Follow-up with PCP/cardiology.  Polymyalgia rheumatica Continue prednisone 25 mg p.o. daily and Repatha  Type 2 diabetes mellitus Diet controlled at home.  Hemoglobin A1c 6.6.  Outpatient follow-up with PCP.  Hypothyroidism: Continue levothyroxine 300 mcg p.o. daily  Discharge Diagnoses:  Active Problems:   Well controlled type 2 diabetes mellitus ([place])   Hypertension associated with diabetes ([place])   CAD (coronary artery disease)    Discharge Instructions  Discharge Instructions    Call MD for:  difficulty breathing, headache or visual disturbances   Complete by: As directed    Call MD for:  extreme fatigue   Complete by: As directed    Call MD for:  persistant dizziness or light-headedness   Complete by: As directed    Call MD for:  persistant nausea and vomiting   Complete by: As directed    Call MD for:  severe uncontrolled pain   Complete by: As directed    Call MD for:  temperature >100.4   Complete by: As directed    Diet - low sodium heart healthy   Complete by: As directed    Increase activity slowly   Complete by: As directed      Allergies as of 08/30/2019      Reactions   Flexeril [cyclobenzaprine] Other (See Comments)   On Pristiq- possible serotonin reaction to flexeril  Ace Inhibitors Other (See Comments)   States after a surgery she was told to "not take" this class of drugs- not 100% sure what the reason was   Codeine Nausea Only, Other (See Comments)   Upset stomach   Singulair [montelukast] Other (See Comments)   Causes depression   Azithromycin Rash      Medication List    TAKE these  medications   acetaminophen 500 MG tablet Commonly known as: TYLENOL Take 1,000 mg by mouth every 8 (eight) hours as needed for mild pain or headache.   albuterol 108 (90 Base) MCG/ACT inhaler Commonly known as: VENTOLIN HFA Inhale 1-2 puffs into the lungs every 4 (four) hours as needed for wheezing or shortness of breath.   amLODipine 10 MG tablet Commonly known as: NORVASC TAKE 1 TABLET EVERY DAY What changed: when to take this   aspirin EC 81 MG tablet Take 81 mg by mouth daily. Swallow whole.   aspirin 325 MG EC tablet Take 325 mg by mouth once as needed (for onset of chest pain).   cyanocobalamin 1000 MCG/ML injection Commonly known as: (VITAMIN B-12) Inject 1 mL (1,000 mcg total) into the muscle every 30 (thirty) days. Uses on amonthly basis What changed: additional instructions   desvenlafaxine 100 MG 24 hr tablet Commonly known as: PRISTIQ TAKE 1 TABLET EVERY DAY   diclofenac 75 MG EC tablet Commonly known as: VOLTAREN Take 75 mg by mouth daily.   fexofenadine 180 MG tablet Commonly known as: ALLEGRA Take 180 mg by mouth daily as needed for allergies or rhinitis.   Fish Oil 1000 MG Caps Take 1,000 mg by mouth daily.   fluticasone 50 MCG/ACT nasal spray Commonly known as: FLONASE USE 1 SPRAY IN EACH NOSTRIL EVERY DAY What changed: See the new instructions.   glucose blood test strip Test blood sugar once per day   loperamide 2 MG capsule Commonly known as: IMODIUM Take 4 mg by mouth daily as needed for diarrhea or loose stools.   montelukast 10 MG tablet Commonly known as: SINGULAIR TAKE 1 TABLET EVERY DAY WITH BREAKFAST What changed: See the new instructions.   nitroGLYCERIN 0.4 MG SL tablet Commonly known as: NITROSTAT Place 1 tablet (0.4 mg total) under the tongue every 5 (five) minutes as needed for chest pain.   predniSONE 5 MG tablet Commonly known as: DELTASONE Take 4-5 pills daily for polymyalgia rheumatica What changed:   how much to  take  how to take this  when to take this  additional instructions   Repatha SureClick 093 MG/ML Soaj Generic drug: Evolocumab Inject 1 pen into the skin every 14 (fourteen) days. What changed: how much to take   Se-Tan PLUS 162-115.2-1 MG Caps Take 1 capsule by mouth twice daily What changed: when to take this   SYSTANE COMPLETE OP Place 1 drop into both eyes in the morning.   Tirosint 150 MCG Caps Generic drug: Levothyroxine Sodium Take 300 mcg by mouth daily before breakfast.   True Metrix Air Glucose Meter w/Device Kit USE AS DIRECTED   TRUEplus Lancets 30G Misc Check blood sugar once per day   Vitamin D-3 125 MCG (5000 UT) Tabs Take 5,000 Units by mouth daily.       Follow-up Information    Marin Olp, MD. Schedule an appointment as soon as possible for a visit in 1 week(s).   Specialty: Family Medicine Contact information: [address] [place] [address] [phone]  Belva Crome, MD. Schedule an appointment as soon as possible for a visit in 2 week(s).   Specialty: Cardiology Contact information: [address] [place] [address] [phone]              Allergies  Allergen Reactions  . Flexeril [Cyclobenzaprine] Other (See Comments)    On Pristiq- possible serotonin reaction to flexeril  . Ace Inhibitors Other (See Comments)    States after a surgery she was told to "not take" this class of drugs- not 100% sure what the reason was  . Codeine Nausea Only and Other (See Comments)    Upset stomach  . Singulair [Montelukast] Other (See Comments)    Causes depression  . Azithromycin Rash    Consultations:  Cardiology   Procedures/Studies: DG Chest 2 View  Result Date: 08/29/2019 CLINICAL DATA:  Chest pain EXAM: CHEST - 2 VIEW COMPARISON:  03/01/2019 FINDINGS: Aortic atherosclerosis. Heart is upper limits normal in size. No confluent opacities, effusions or edema. No acute bony  abnormality. IMPRESSION: No active cardiopulmonary disease. Electronically Signed   By: Rolm Baptise M.D.   On: 08/29/2019 16:39   DG Ankle Complete Right  Result Date: 08/19/2019 CLINICAL DATA:  Ankle pain for several weeks, no known injury, initial encounter EXAM: RIGHT ANKLE - COMPLETE 3+ VIEW COMPARISON:  08/28/2012 FINDINGS: Mild soft tissue prominence is noted about the ankle stable from the prior exam. No acute fracture or dislocation is noted. Some tarsal degenerative changes are seen. IMPRESSION: No acute abnormality noted.  Chronic degenerative changes noted. Electronically Signed   By: Inez Catalina M.D.   On: 08/19/2019 21:46   NM Myocar Multi W/Spect W/Wall Motion / EF  Result Date: 08/30/2019  There was no ST segment deviation noted during stress.  This is a low risk study.  The left ventricular ejection fraction is normal (55-65%).  There is mild apical thinning in both stress and rest images, but there is no evidence of ischemia. Normal EF and wall motion. Low risk study.      Subjective: Patient seen and examined bedside, resting comfortably.  Just returned back from Myoview stress test.  Stress test negative for inducible ischemia with preserved LVEF. Remains chest pain free. No other complaints or concerns. Denies headache, no visual changes, no fever/chills/night sweats, no nausea cefonicid diarrhea, no chest pain, no palpitations, no shortness of breath, no abdominal pain, no weakness, no fatigue, no paresthesias.  No acute events overnight per nursing staff.  Discharge Exam: Vitals:   08/30/19 1414 08/30/19 1416  BP: 134/67 135/69  Pulse: 66 65  Resp:    Temp:    SpO2:     Vitals:   08/30/19 1349 08/30/19 1412 08/30/19 1414 08/30/19 1416  BP: (!) 158/82 137/68 134/67 135/69  Pulse: 75 68 66 65  Resp:      Temp:      TempSrc:      SpO2:      Weight:      Height:        General: Pt is alert, awake, not in acute distress Cardiovascular: RRR, S1/S2 +, no rubs,  no gallops Respiratory: CTA bilaterally, no wheezing, no rhonchi Abdominal: Soft, NT, ND, bowel sounds + Extremities: no edema, no cyanosis    The results of significant diagnostics from this hospitalization (including imaging, microbiology, ancillary and laboratory) are listed below for reference.     Microbiology: Recent Results (from the past 240 hour(s))  SARS Coronavirus 2 by RT  PCR (hospital order, performed in [facility] hospital lab) Nasopharyngeal Nasopharyngeal Swab     Status: None   Collection Time: 08/30/19 12:23 AM   Specimen: Nasopharyngeal Swab  Result Value Ref Range Status   SARS Coronavirus 2 NEGATIVE NEGATIVE Final    Comment: (NOTE) SARS-CoV-2 target nucleic acids are NOT DETECTED.  The SARS-CoV-2 RNA is generally detectable in upper and lower respiratory specimens during the acute phase of infection. The lowest concentration of SARS-CoV-2 viral copies this assay can detect is 250 copies / mL. A negative result does not preclude SARS-CoV-2 infection and should not be used as the sole basis for treatment or other patient management decisions.  A negative result may occur with improper specimen collection / handling, submission of specimen other than nasopharyngeal swab, presence of viral mutation(s) within the areas targeted by this assay, and inadequate number of viral copies (<250 copies / mL). A negative result must be combined with clinical observations, patient history, and epidemiological information.  Fact Sheet for Patients:   StrictlyIdeas.no  Fact Sheet for Healthcare Providers: BankingDealers.co.za  This test is not yet approved or  cleared by the [place] FDA and has been authorized for detection and/or diagnosis of SARS-CoV-2 by FDA under an Emergency Use Authorization (EUA).  This EUA will remain in effect (meaning this test can be used) for the duration of the COVID-19 declaration under  Section 564(b)(1) of the Act, 21 U.S.C. section 360bbb-3(b)(1), unless the authorization is terminated or revoked sooner.  Performed at [facility], [place] [address], [place], [place] [address]      Labs: BNP (last 3 results) No results for input(s): BNP in the last 8760 hours. Basic Metabolic Panel: Recent Labs  Lab 08/29/19 1611  NA 134*  K 4.6  CL 96*  CO2 26  GLUCOSE 199*  BUN 17  CREATININE 0.94  CALCIUM 10.1   Liver Function Tests: No results for input(s): AST, ALT, ALKPHOS, BILITOT, PROT, ALBUMIN in the last 168 hours. No results for input(s): LIPASE, AMYLASE in the last 168 hours. No results for input(s): AMMONIA in the last 168 hours. CBC: Recent Labs  Lab 08/29/19 1611  WBC 10.7*  HGB 12.7  HCT 41.1  MCV 91.5  PLT 251   Cardiac Enzymes: No results for input(s): CKTOTAL, CKMB, CKMBINDEX, TROPONINI in the last 168 hours. BNP: Invalid input(s): POCBNP CBG: Recent Labs  Lab 08/30/19 0000 08/30/19 0557 08/30/19 0806 08/30/19 1114 08/30/19 1557  GLUCAP 169* 89 103* 117* 154*   D-Dimer No results for input(s): DDIMER in the last 72 hours. Hgb A1c Recent Labs    08/30/19 0624  HGBA1C 6.6*   Lipid Profile Recent Labs    08/30/19 0624  CHOL 150  HDL 82  LDLCALC 49  TRIG 96  CHOLHDL 1.8   Thyroid function studies No results for input(s): TSH, T4TOTAL, T3FREE, THYROIDAB in the last 72 hours.  Invalid input(s): FREET3 Anemia work up No results for input(s): VITAMINB12, FOLATE, FERRITIN, TIBC, IRON, RETICCTPCT in the last 72 hours. Urinalysis    Component Value Date/Time   COLORURINE YELLOW 09/03/2016 1048   APPEARANCEUR CLEAR 09/03/2016 1048   LABSPEC 1.015 09/03/2016 1048   PHURINE 6.0 09/03/2016 1048   GLUCOSEU NEGATIVE 09/03/2016 1048   HGBUR NEGATIVE 09/03/2016 1048   HGBUR negative 10/01/2007 [place] 09/03/2016 1048   BILIRUBINUR n 11/03/2015 [place] 09/03/2016 1048   PROTEINUR n  11/03/2015 1639   UROBILINOGEN 0.2 09/03/2016 1048  NITRITE NEGATIVE 09/03/2016 1048   LEUKOCYTESUR NEGATIVE 09/03/2016 1048   Sepsis Labs Invalid input(s): PROCALCITONIN,  WBC,  LACTICIDVEN Microbiology Recent Results (from the past 240 hour(s))  SARS Coronavirus 2 by RT PCR (hospital order, performed in [facility] hospital lab) Nasopharyngeal Nasopharyngeal Swab     Status: None   Collection Time: 08/30/19 12:23 AM   Specimen: Nasopharyngeal Swab  Result Value Ref Range Status   SARS Coronavirus 2 NEGATIVE NEGATIVE Final    Comment: (NOTE) SARS-CoV-2 target nucleic acids are NOT DETECTED.  The SARS-CoV-2 RNA is generally detectable in upper and lower respiratory specimens during the acute phase of infection. The lowest concentration of SARS-CoV-2 viral copies this assay can detect is 250 copies / mL. A negative result does not preclude SARS-CoV-2 infection and should not be used as the sole basis for treatment or other patient management decisions.  A negative result may occur with improper specimen collection / handling, submission of specimen other than nasopharyngeal swab, presence of viral mutation(s) within the areas targeted by this assay, and inadequate number of viral copies (<250 copies / mL). A negative result must be combined with clinical observations, patient history, and epidemiological information.  Fact Sheet for Patients:   StrictlyIdeas.no  Fact Sheet for Healthcare Providers: BankingDealers.co.za  This test is not yet approved or  cleared by the [place] FDA and has been authorized for detection and/or diagnosis of SARS-CoV-2 by FDA under an Emergency Use Authorization (EUA).  This EUA will remain in effect (meaning this test can be used) for the duration of the COVID-19 declaration under Section 564(b)(1) of the Act, 21 U.S.C. section 360bbb-3(b)(1), unless the authorization is terminated or revoked  sooner.  Performed at [facility], [place] [address], [place], [place] [address]      Time coordinating discharge: Over 30 minutes  SIGNED:   Gregoire Bennis J [place] DO  [facility] 08/30/2019, 4:34 PM

## 2019-09-03 ENCOUNTER — Other Ambulatory Visit: Payer: Self-pay

## 2019-09-03 ENCOUNTER — Encounter: Payer: Self-pay | Admitting: Family Medicine

## 2019-09-03 ENCOUNTER — Ambulatory Visit (INDEPENDENT_AMBULATORY_CARE_PROVIDER_SITE_OTHER): Payer: Medicare HMO | Admitting: Family Medicine

## 2019-09-03 VITALS — BP 127/68 | HR 65 | Temp 97.7°F | Ht 61.0 in | Wt 205.0 lb

## 2019-09-03 DIAGNOSIS — M353 Polymyalgia rheumatica: Secondary | ICD-10-CM

## 2019-09-03 DIAGNOSIS — E119 Type 2 diabetes mellitus without complications: Secondary | ICD-10-CM

## 2019-09-03 DIAGNOSIS — D849 Immunodeficiency, unspecified: Secondary | ICD-10-CM | POA: Diagnosis not present

## 2019-09-03 DIAGNOSIS — I2510 Atherosclerotic heart disease of native coronary artery without angina pectoris: Secondary | ICD-10-CM | POA: Diagnosis not present

## 2019-09-03 NOTE — Patient Instructions (Addendum)
Health Maintenance Due  Topic Date Due  . Hepatitis C Screening - next labs Never done  . INFLUENZA VACCINE -  - will complete later in flu season (please let us know if you get this at another location so we can update your chart) . We should have vaccination here in 1-2 months - can call back for an appointment.   08/22/2019   Continue prednisone 25 mg for 2 more weeks- as long as things are are stable or hopefully improved- lets try down to 20mg  after 2 weeks then see me around 4-6 weeks. If symptoms worsen can go back to 25 mg.   I hope you have a safe trip to [place]- if you have to be out while you are there consider n95 or kn95 mask  Recommended follow up: Return in about 1 month (around 10/04/2019) for follow up- or sooner if needed.

## 2019-09-03 NOTE — Progress Notes (Signed)
Phone [phone] In person visit   Subjective:   [name] is a 79 y.o. year old very pleasant female patient who presents for/with See problem oriented charting Chief Complaint  Patient presents with  . Follow-up   This visit occurred during the SARS-CoV-2 public health emergency.  Safety protocols were in place, including screening questions prior to the visit, additional usage of staff PPE, and extensive cleaning of exam room while observing appropriate contact time as indicated for disinfecting solutions.   Past Medical History-  Patient Active Problem List   Diagnosis Date Noted  . Immunocompromised state ([place]) 09/03/2019    Priority: High  . Polymyalgia rheumatica ([place]) 03/16/2018    Priority: High  . CAD (coronary artery disease) 11/19/2017    Priority: High  . Hypercalcemia 12/29/2014    Priority: High  . OA (osteoarthritis) of knee 10/05/2013    Priority: High  . Well controlled type 2 diabetes mellitus ([place]) 07/18/2006    Priority: High  . B12 deficiency 05/31/2019    Priority: Medium  . Overactive bladder 09/03/2016    Priority: Medium  . Morbid obesity ([place]) 06/28/2015    Priority: Medium  . History of gastric bypass 02/08/2014    Priority: Medium  . Hyperlipidemia associated with type 2 diabetes mellitus ([place]) 01/05/2008    Priority: Medium  . Hypothyroidism 09/09/2007    Priority: Medium  . Major depression, recurrent, full remission ([place]) 07/18/2006    Priority: Medium  . Hypertension associated with diabetes ([place]) 07/18/2006    Priority: Medium  . Asthma 07/18/2006    Priority: Medium  . Allergic rhinitis 07/20/2014    Priority: Low  . CHEST WALL PAIN, ANTERIOR 10/09/2007    Priority: Low  . Primary osteoarthritis of right knee 06/28/2019  . Coronary artery disease of native artery of native heart with stable angina pectoris ([place]) 08/31/2018  . Chest pain with high risk for cardiac etiology 04/02/2018  . Hypercholesteremia   . Anxiety  07/17/2017  . Osteopenia of left hip 07/17/2017    Medications- reviewed and updated Current Outpatient Medications  Medication Sig Dispense Refill  . acetaminophen (TYLENOL) 500 MG tablet Take 1,000 mg by mouth every 8 (eight) hours as needed for mild pain or headache.    . albuterol (VENTOLIN HFA) 108 (90 Base) MCG/ACT inhaler Inhale 1-2 puffs into the lungs every 4 (four) hours as needed for wheezing or shortness of breath. 18 g 2  . amLODipine (NORVASC) 10 MG tablet TAKE 1 TABLET EVERY DAY (Patient taking differently: Take 10 mg by mouth at bedtime. ) 90 tablet 0  . aspirin EC 81 MG tablet Take 81 mg by mouth daily. Swallow whole.    . Blood Glucose Monitoring Suppl (TRUE METRIX AIR GLUCOSE METER) w/Device KIT USE AS DIRECTED 1 kit 0  . Cholecalciferol (VITAMIN D-3) 125 MCG (5000 UT) TABS Take 5,000 Units by mouth daily.    . cyanocobalamin (,VITAMIN B-12,) 1000 MCG/ML injection Inject 1 mL (1,000 mcg total) into the muscle every 30 (thirty) days. Uses on amonthly basis (Patient taking differently: Inject 1,000 mcg into the muscle every 30 (thirty) days. ) 10 mL 0  . desvenlafaxine (PRISTIQ) 100 MG 24 hr tablet TAKE 1 TABLET EVERY DAY (Patient taking differently: Take 100 mg by mouth daily. ) 90 tablet 0  . diclofenac (VOLTAREN) 75 MG EC tablet Take 75 mg by mouth daily. Not taking while on prednisone    . Evolocumab (REPATHA SURECLICK) 654 MG/ML SOAJ Inject 1 pen into  the skin every 14 (fourteen) days. (Patient taking differently: Inject 140 mg into the skin every 14 (fourteen) days. ) 6 pen 3  . FeFum-FePo-FA-B Cmp-C-Zn-Mn-Cu (SE-TAN PLUS) 162-115.2-1 MG CAPS Take 1 capsule by mouth twice daily (Patient taking differently: Take 1 capsule by mouth daily. ) 180 capsule 0  . fexofenadine (ALLEGRA) 180 MG tablet Take 180 mg by mouth daily as needed for allergies or rhinitis.    . fluticasone (FLONASE) 50 MCG/ACT nasal spray USE 1 SPRAY IN EACH NOSTRIL EVERY DAY (Patient taking differently: Place  2 sprays into both nostrils daily. ) 32 g 0  . glucose blood test strip Test blood sugar once per day 100 each 3  . Levothyroxine Sodium (TIROSINT) 150 MCG CAPS Take 300 mcg by mouth daily before breakfast.     . loperamide (IMODIUM) 2 MG capsule Take 4 mg by mouth daily as needed for diarrhea or loose stools.     . nitroGLYCERIN (NITROSTAT) 0.4 MG SL tablet Place 1 tablet (0.4 mg total) under the tongue every 5 (five) minutes as needed for chest pain. 50 tablet 3  . Omega-3 Fatty Acids (FISH OIL) 1000 MG CAPS Take 1,000 mg by mouth daily.     . predniSONE (DELTASONE) 5 MG tablet Take 4-5 pills daily for polymyalgia rheumatica (Patient taking differently: Take 25 mg by mouth daily with breakfast. ) 150 tablet 5  . Propylene Glycol (SYSTANE COMPLETE OP) Place 1 drop into both eyes in the morning.     . TRUEplus Lancets 30G MISC Check blood sugar once per day 100 each 3  . montelukast (SINGULAIR) 10 MG tablet TAKE 1 TABLET EVERY DAY WITH BREAKFAST (Patient not taking: Reported on 09/03/2019) 90 tablet 3   No current facility-administered medications for this visit.     Objective:  BP 127/68   Pulse 65   Temp 97.7 F (36.5 C) (Temporal)   Ht 5' 1"  (1.549 m)   Wt 205 lb (93 kg)   SpO2 97%   BMI 38.73 kg/m  Gen: NAD, resting comfortably CV: RRR no murmurs rubs or gallops Lungs: CTAB no crackles, wheeze, rhonchi Ext: no edema Skin: warm, dry MSK: 5- out of 5 strength with flexion at hip     Assessment and Plan    #social update July 2021- in [place] friend dying of multiple myeloma plus is dealing with covid and cant be there with her.   -She plans to make a trip to [place] soon to visit-her friend has strongly requested to see her -Immunocompromise state-Before her trip I would like for her to pursue COVID-19 booster immunization based off FDA changes on EUA from yesterday with her chronic prednisone use  # Chest Pain / CAD S:Seen in [place] on 08/29/2019. Had full cardiac workup  that was negative at that time (EKG, CXR, troponin curve flat, stress myoview). Outpatient follow up with Dr. Tamala Julian cardiology recommended (cant get into October) A/P: Thankful cardiac work-up was reassuring.Could have been GI related. Tums helps with reflux. May also trial pepcid. She has history of Gi surgery with Dr. Hassell Done and plans to follow up with him.  Also mentioned to her possible GI follow-up but she wants to start with Dr. Hassell Done   # Polymyalgia rheumatica S:Last seen 08/19/2019 in office.  We opted to increase to max dose prednisone 25 mg for 2 weeks and then reassess. Depending on how she does with this course-could consider rheumatology input. Does not feel like she has improved as rapidly as  last visit.   Feels stiff in the morning still until around noon or 1. Mainly in her thighs- some in the calves. Upper body is better. TMJ is still bothering her (tolerable) A/P: Improved but not as significantly as the first time we started on prednisone for polymyalgia rheumatica.  We discussed possibly referring to rheumatology-she would like to hold off for now.  I would like to continue prednisone 25 mg for another 2 weeks-after that recommended she trial 20 mg as long as symptoms are not worsening-can bump back up if symptoms do worsen  # Right ankle pain  S:Xray done at last visit. She had had no pain or issues after xray. Declines referral to sports medicine at this time. Will call back if pain increases or changes.  A/P: Thankful for resolution of pain  # Diabetes S: Medication:None/diet controlled  Lab Results  Component Value Date   HGBA1C 6.6 (H) 08/30/2019   HGBA1C 6.0 05/31/2019   HGBA1C 6.1 (H) 11/25/2018   A/P: We discussed potential for prednisone to increase blood sugar levels-we will update A1c 3 months from August 10.  May have to use medicine short-term but perhaps not if we can get her off of prednisone  Recommended follow up: 1 month to 6 weeks follow-up Future  Appointments  Date Time Provider [place]  10/19/2019 10:00 AM Marin Olp, MD [facility]  10/29/2019 10:40 AM Belva Crome, MD [facility]    Lab/Order associations:   ICD-10-CM   1. Polymyalgia rheumatica (HCC)  M35.3   2. Coronary artery disease involving native coronary artery of native heart without angina pectoris  I25.10   3. Immunocompromised state ([place])  D84.9   4. Well controlled type 2 diabetes mellitus ([place])  E11.9    Time Spent: 31 minutes of total time (9:20 AM- 9:51 AM) was spent on the date of the encounter performing the following actions: chart review prior to seeing the patient, obtaining history, performing a medically necessary exam, counseling on the treatment plan, placing orders, and documenting in our EHR.   Return precautions advised.  Garret Reddish, MD

## 2019-09-04 DIAGNOSIS — Z23 Encounter for immunization: Secondary | ICD-10-CM | POA: Diagnosis not present

## 2019-09-13 ENCOUNTER — Telehealth: Payer: Self-pay | Admitting: Family Medicine

## 2019-09-13 NOTE — Progress Notes (Signed)
°  Chronic Care Management   Outreach Note  09/13/2019 Name: [name] MRN: [id number] DOB: 06/02/1940  Referred by: Marin Olp, MD Reason for referral : No chief complaint on file.   An unsuccessful telephone outreach was attempted today. The patient was referred to the pharmacist for assistance with care management and care coordination.   Follow Up Plan:   Earney [place] Upstream Scheduler

## 2019-09-14 ENCOUNTER — Telehealth: Payer: Self-pay | Admitting: Family Medicine

## 2019-09-14 NOTE — Progress Notes (Signed)
  Chronic Care Management   Note  09/14/2019 Name: [name] MRN: [id number] DOB: [birth year]  [name] is a 79 y.o. year old female who is a primary care patient of Marin Olp, MD. I reached out to [name] by phone today in response to a referral sent by [name]'s PCP, Marin Olp, MD.   [name] was given information about Chronic Care Management services today including:  1. CCM service includes personalized support from designated clinical staff supervised by her physician, including individualized plan of care and coordination with other care providers 2. 24/7 contact phone numbers for assistance for urgent and routine care needs. 3. Service will only be billed when office clinical staff spend 20 minutes or more in a month to coordinate care. 4. Only one practitioner may furnish and bill the service in a calendar month. 5. The patient may stop CCM services at any time (effective at the end of the month) by phone call to the office staff.   Patient wishes to consider information provided and/or speak with a member of the care team before deciding about enrollment in care management services.   Follow up plan:   Earney [place] Upstream Scheduler

## 2019-09-27 ENCOUNTER — Encounter: Payer: Self-pay | Admitting: Family Medicine

## 2019-10-01 ENCOUNTER — Ambulatory Visit: Payer: Medicare HMO | Admitting: Family Medicine

## 2019-10-03 ENCOUNTER — Other Ambulatory Visit: Payer: Self-pay | Admitting: Family Medicine

## 2019-10-18 NOTE — Progress Notes (Signed)
Phone [phone]   Subjective:  Patient presents today for their annual physical. Chief complaint-noted.     See problem oriented charting- ROS- full  review of systems was completed and negative except for: activity change (biking at [employer] 3 days a week), appetite change (increased with prednisone), fatigue, facial swelling (with prednisone), mouth sores (prednisone/stress- canker sores), eye itching, light sensitivity (not new), diarrhea (immodium prn), urinary frequency (stable and uses depends at night- was told if cut back on fluids at night could help), joint pain, back pain, gait issues, muscle aches, seasonal allergies, agitation, anxious, sleep issues (on prednisone)  The following were reviewed and entered/updated in epic: Past Medical History:  Diagnosis Date  . Anemia   . Arthritis    osteoarthritis - shoulder, knees & hips  . Asthma   . Coronary artery disease   . Depression   . Diabetes mellitus without complication (HCC)    no meds  . Hypertension    Dr. Orinda Kenner manages BP, pt. reports MD has not found a need for treatment   . Hypothyroidism   . Sleep difficulties    had sleep study -2009, prior to gastric surgery, told that there was not a need for f/u  . Thyroid disease   . Vitamin B 12 deficiency    Patient Active Problem List   Diagnosis Date Noted  . Immunocompromised state ([place]) 09/03/2019    Priority: High  . Polymyalgia rheumatica ([place]) 03/16/2018    Priority: High  . CAD (coronary artery disease) 11/19/2017    Priority: High  . Hypercalcemia 12/29/2014    Priority: High  . OA (osteoarthritis) of knee 10/05/2013    Priority: High  . Well controlled type 2 diabetes mellitus ([place]) 07/18/2006    Priority: High  . B12 deficiency 05/31/2019    Priority: Medium  . Overactive bladder 09/03/2016    Priority: Medium  . Morbid obesity ([place]) 06/28/2015    Priority: Medium  . History of gastric bypass 02/08/2014    Priority: Medium  .  Hyperlipidemia associated with type 2 diabetes mellitus ([place]) 01/05/2008    Priority: Medium  . Hypothyroidism 09/09/2007    Priority: Medium  . Major depression, recurrent, full remission ([place]) 07/18/2006    Priority: Medium  . Hypertension associated with diabetes ([place]) 07/18/2006    Priority: Medium  . Asthma 07/18/2006    Priority: Medium  . Allergic rhinitis 07/20/2014    Priority: Low  . CHEST WALL PAIN, ANTERIOR 10/09/2007    Priority: Low  . Primary osteoarthritis of right knee 06/28/2019  . Coronary artery disease of native artery of native heart with stable angina pectoris ([place]) 08/31/2018  . Chest pain with high risk for cardiac etiology 04/02/2018  . Hypercholesteremia   . Anxiety 07/17/2017  . Osteopenia of left hip 07/17/2017   Past Surgical History:  Procedure Laterality Date  . ABDOMINAL HYSTERECTOMY    . BARIATRIC SURGERY    . CARDIAC CATHETERIZATION     [facility]- 30 yrs. ago  . CATARACT EXTRACTION, BILATERAL     late 2018  . LEFT HEART CATH AND CORONARY ANGIOGRAPHY N/A 04/02/2018   Procedure: LEFT HEART CATH AND CORONARY ANGIOGRAPHY;  Surgeon: Martinique, Peter M, MD;  Location: [facility];  Service: Cardiovascular;  Laterality: N/A;  . OOPHORECTOMY    . pantallor arthrodesis with rod placement left foot    . TONSILLECTOMY    . TOTAL KNEE ARTHROPLASTY Left 11/30/2018   Procedure: TOTAL KNEE ARTHROPLASTY;  Surgeon: Wynelle Link,  Pilar Plate, MD;  Location: [facility];  Service: Orthopedics;  Laterality: Left;  . TOTAL KNEE ARTHROPLASTY Right 06/28/2019   Procedure: TOTAL KNEE ARTHROPLASTY;  Surgeon: Gaynelle Arabian, MD;  Location: [facility];  Service: Orthopedics;  Laterality: Right;  43mn  . TOTAL SHOULDER ARTHROPLASTY Left 03/12/2012   Procedure: TOTAL SHOULDER ARTHROPLASTY;  Surgeon: KMarin Shutter MD;  [facility]  Service: Orthopedics;  Laterality: Left;    Family History  Problem Relation Age of Onset  . Stroke Mother   . Liver cancer Father   . Cancer Father         liver  . Breast cancer Neg Hx     Medications- reviewed and updated Current Outpatient Medications  Medication Sig Dispense Refill  . acetaminophen (TYLENOL) 500 MG tablet Take 1,000 mg by mouth every 8 (eight) hours as needed for mild pain or headache.    . albuterol (VENTOLIN HFA) 108 (90 Base) MCG/ACT inhaler Inhale 1-2 puffs into the lungs every 4 (four) hours as needed for wheezing or shortness of breath. 18 g 2  . amLODipine (NORVASC) 10 MG tablet TAKE 1 TABLET EVERY DAY (Patient taking differently: Take 10 mg by mouth at bedtime. ) 90 tablet 0  . aspirin EC 81 MG tablet Take 81 mg by mouth daily. Swallow whole.    . Blood Glucose Monitoring Suppl (TRUE METRIX AIR GLUCOSE METER) w/Device KIT USE AS DIRECTED 1 kit 0  . Cholecalciferol (VITAMIN D-3) 125 MCG (5000 UT) TABS Take 5,000 Units by mouth daily.    . cyanocobalamin (,VITAMIN B-12,) 1000 MCG/ML injection Inject 1 mL (1,000 mcg total) into the muscle every 30 (thirty) days. Uses on amonthly basis (Patient taking differently: Inject 1,000 mcg into the muscle every 30 (thirty) days. ) 10 mL 0  . desvenlafaxine (PRISTIQ) 100 MG 24 hr tablet TAKE 1 TABLET EVERY DAY (Patient taking differently: Take 100 mg by mouth daily. ) 90 tablet 0  . diclofenac (VOLTAREN) 75 MG EC tablet Take 75 mg by mouth daily. Not taking while on prednisone    . Evolocumab (REPATHA SURECLICK) 1383MG/ML SOAJ Inject 1 pen into the skin every 14 (fourteen) days. (Patient taking differently: Inject 140 mg into the skin every 14 (fourteen) days. ) 6 pen 3  . FeFum-FePo-FA-B Cmp-C-Zn-Mn-Cu (SE-TAN PLUS) 162-115.2-1 MG CAPS TAKE ONE CAPSULE BY MOUTH TWICE A DAY 180 capsule 0  . fexofenadine (ALLEGRA) 180 MG tablet Take 180 mg by mouth daily as needed for allergies or rhinitis.    . fluticasone (FLONASE) 50 MCG/ACT nasal spray USE 1 SPRAY IN EACH NOSTRIL EVERY DAY (Patient taking differently: Place 2 sprays into both nostrils daily. ) 32 g 0  . glucose blood test strip  Test blood sugar once per day 100 each 3  . Levothyroxine Sodium (TIROSINT) 150 MCG CAPS Take 300 mcg by mouth daily before breakfast.     . loperamide (IMODIUM) 2 MG capsule Take 4 mg by mouth daily as needed for diarrhea or loose stools.     . nitroGLYCERIN (NITROSTAT) 0.4 MG SL tablet Place 1 tablet (0.4 mg total) under the tongue every 5 (five) minutes as needed for chest pain. 50 tablet 3  . Omega-3 Fatty Acids (FISH OIL) 1000 MG CAPS Take 1,000 mg by mouth daily.     . predniSONE (DELTASONE) 5 MG tablet Take 4-5 pills daily for polymyalgia rheumatica (Patient taking differently: Take 25 mg by mouth daily with breakfast. ) 150 tablet 5  . Propylene Glycol (SYSTANE  COMPLETE OP) Place 1 drop into both eyes in the morning.     . TRUEplus Lancets 30G MISC Check blood sugar once per day 100 each 3  . HYDROcodone-acetaminophen (NORCO/VICODIN) 5-325 MG tablet Take 1 tablet by mouth every 6 (six) hours as needed for moderate pain. 20 tablet 0  . montelukast (SINGULAIR) 10 MG tablet TAKE 1 TABLET EVERY DAY WITH BREAKFAST (Patient not taking: Reported on 09/03/2019) 90 tablet 3   No current facility-administered medications for this visit.    Allergies-reviewed and updated Allergies  Allergen Reactions  . Flexeril [Cyclobenzaprine] Other (See Comments)    On Pristiq- possible serotonin reaction to flexeril  . Ace Inhibitors Other (See Comments)    States after a surgery she was told to "not take" this class of drugs- not 100% sure what the reason was  . Codeine Nausea Only and Other (See Comments)    Upset stomach  . Singulair [Montelukast] Other (See Comments)    Causes depression  . Azithromycin Rash    Social History   Social History Narrative   Widowed 2013. 2 kids. 4 grandkids. Oldest daughter lives in [place] and has 2 grandkids that are now in Scientist, physiological, PT school at [employer].       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 [facility] [address])   Objective  Objective:  BP 128/64   Pulse 75   Temp 98 F (36.7 C) (Temporal)   Ht 5' 1"  (1.549 m)   Wt 211 lb 12.8 oz (96.1 kg)   SpO2 98%   BMI 40.02 kg/m  Gen: NAD, resting comfortably HEENT: Mucous membranes are moist. Oropharynx normal other than geographic tongue Neck: no lymphadenopathy CV: RRR stable murmur Lungs: CTAB no crackles, wheeze, rhonchi Abdomen: soft/nontender/nondistended/normal bowel sounds. No rebound or guarding.  Ext: no edema Skin: warm, dry Neuro: grossly normal, moves all extremities, PERRLA   Assessment and Plan   79 y.o. female presenting for annual physical.  Health Maintenance counseling: 1. Anticipatory guidance: Patient counseled regarding regular dental exams q6 months-daughter is her dentist, eye exams- Dr. Ellie Lunch,  avoiding smoking and second hand smoke, limiting alcohol to 1 beverage per day- occasional wine   2. Risk factor reduction:  Advised patient of need for regular exercise and diet rich and fruits and vegetables to reduce risk of heart attack and stroke. Exercise- Goes to [employer] 3 times a week.. Diet-  Weight down 2 pounds from physical 2019- particularly impressive with prednisone- got as low as 202 on home scale Wt Readings from Last 3 Encounters:  10/19/19 211 lb 12.8 oz (96.1 kg)  09/03/19 205 lb (93 kg)  08/30/19 218 lb 14.7 oz (99.3 kg)  3. Immunizations/screenings/ancillary studies-high-dose flu shot given today.  Discussed one-time lifetime screening hepatitis C  Immunization History  Administered Date(s) Administered  . Fluad Quad(high Dose 65+) 09/22/2018  . Influenza Split 10/22/2010, 11/14/2011  . Influenza Whole 11/13/2006, 10/27/2008  . Influenza, High Dose Seasonal PF 10/28/2014, 09/28/2015, 10/18/2016, 11/19/2017  . Influenza,inj,Quad PF,6+ Mos 10/05/2012, 10/05/2013  . Influenza-Unspecified 10/21/2013  . PFIZER SARS-COV-2 Vaccination 02/11/2019, 03/04/2019  .  Pneumococcal Conjugate-13 03/01/2013, 10/21/2013  . Pneumococcal Polysaccharide-23 05/31/2006  . Td 11/24/1998, 03/01/2013  . Zoster 06/20/2006  . Zoster Recombinat (Shingrix) 10/31/2016, 05/01/2017  4. Cervical cancer screening- patient is past age based screening recommendations.  No history of abnormal Pap smear 5. Breast cancer screening-  mammogram  11/06/16- declines repeat for now 6. Colon cancer screening - 10/19/2014 and GI advised that was to be the last colonoscopy 7. Skin cancer screening-no dermatologist. advised regular sunscreen use. Denies worrisome, changing, or new skin lesions.  8. Birth control/STD check- postmenopausal. Not dating.  9. Osteoporosis screening at 72- known osteopenia followed by Dr. Elyse Hsu. Due for repeat next year -Non smoker  Status of chronic or acute concerns   #Polymyalgia rheumatica--recurrent since July 2021.  Peak prednisone 25 mg.  Currently on 43m daily (4 of 5 mg tablets). Has noted improvement in stiffness/pain on this in hips/shoulders/neck.   - If you feel like symptoms are stable for 2 to 4 weeks may decrease prednisone by 2.5 mg every 2 to 4 weeks. Perhaps less follow-up in 2 to 3 months to see where things stand. If not able to step down could also place a referral to rheumatology since this has not responded as quickly as prior episode while  - requests hydrocodone for left knee but ongoing pain. Also low back pain- off voltaren really bothering her. Dilaudid was too strong- requests hydrocodone again- recommended sparing use  #CAD-based on coronary CT done by Dr. STamala Julian  Compliant with aspirin. Later confirmed by cath 04/02/2018 no intervention required #hyperlipidemia S: Medication:Remains on Repatha every 14 days  A/P: Coronary artery disease asymptomatic-continue Repatha and aspirin.  Lipids have been very well controlled within the last 2 months with LDL under 70-continue Repatha   # Diabetes S: Medication: None-diet/exercise  controlled CBGs- has seen some elevations but better on repeat even below 100 Exercise and diet- see above  A/P: A1c was well controlled within 2 months-too early for repeat A1c.  Continue to work on healthy eating/regular exercise-levels may trend up due to prednisone    #Hypertension-controlled with amlodipine 10 mg  #Hypothyroidism-managed by Dr. AElyse Hsu  Defer labs to him   #Allergies-off Singulair-discussed mood related issues in past  #Depression-reasonable control on Pristiq 100 mg. Some agitation. No depressed mood or anheadonia  #B12 deficiency-remains on B12 injections at home. Update level today   #Elevated alkaline phosphatase in the past-normal last check-consider GGT if goes back up in the future  Recommended follow up: Return in about 2 months (around 12/19/2019). Future Appointments  Date Time Provider [place] 10/29/2019 10:40 AM SBelva Crome MD [facility]    Lab/Order associations:Non fasting- banana and juice   ICD-10-CM   1. Preventative health care  Z00.00   2. Hypertension associated with diabetes ([place]  E11.59    I10   3. Hyperlipidemia associated with type 2 diabetes mellitus ([place]  E11.69    E78.5   4. Hypothyroidism, unspecified type  E03.9   5. Well controlled type 2 diabetes mellitus ([place]  E11.9 CBC With Differential/Platelet    COMPLETE METABOLIC PANEL WITH GFR  6. Anxiety  F41.9   7. B12 deficiency  E53.8 Vitamin B12  8. Encounter for hepatitis C screening test for low risk patient  Z11.59 HEP C AB W/REFL    Meds ordered this encounter  Medications  . HYDROcodone-acetaminophen (NORCO/VICODIN) 5-325 MG tablet    Sig: Take 1 tablet by mouth every 6 (six) hours as needed for moderate pain.    Dispense:  20 tablet    Refill:  0    OA of the knees-back pain as well    Return precautions advised.  SGarret Reddish MD

## 2019-10-18 NOTE — Patient Instructions (Addendum)
Health Maintenance Due  Topic Date Due  . Hepatitis C Screening - with labs Never done  . INFLUENZA VACCINE In office flu shot high dose 08/22/2019   If you feel like symptoms are stable for 2 to 4 weeks may decrease prednisone by 2.5 mg every 2 to 4 weeks. Perhaps less follow-up in 2 to 3 months to see where things stand. If not able to step down could also place a referral to rheumatology since this has not responded as quickly as prior episode while  Please stop by lab before you go If you have mychart- we will send your results within 3 business days of [place] receiving them.  If you do not have mychart- we will call you about results within 5 business days of [place] receiving them.  *please note we are currently using [facility] which has a longer processing time than [facility] typically so labs may not come back as quickly as in the past *please also note that you will see labs on mychart as soon as they post. I will later go in and write notes on them- will say "notes from Dr. Yong Channel"

## 2019-10-19 ENCOUNTER — Other Ambulatory Visit: Payer: Self-pay

## 2019-10-19 ENCOUNTER — Encounter: Payer: Self-pay | Admitting: Family Medicine

## 2019-10-19 ENCOUNTER — Ambulatory Visit (INDEPENDENT_AMBULATORY_CARE_PROVIDER_SITE_OTHER): Payer: Medicare HMO | Admitting: Family Medicine

## 2019-10-19 VITALS — BP 128/64 | HR 75 | Temp 98.0°F | Ht 61.0 in | Wt 211.8 lb

## 2019-10-19 DIAGNOSIS — E1159 Type 2 diabetes mellitus with other circulatory complications: Secondary | ICD-10-CM

## 2019-10-19 DIAGNOSIS — E1169 Type 2 diabetes mellitus with other specified complication: Secondary | ICD-10-CM

## 2019-10-19 DIAGNOSIS — Z0000 Encounter for general adult medical examination without abnormal findings: Secondary | ICD-10-CM | POA: Diagnosis not present

## 2019-10-19 DIAGNOSIS — E119 Type 2 diabetes mellitus without complications: Secondary | ICD-10-CM | POA: Diagnosis not present

## 2019-10-19 DIAGNOSIS — Z23 Encounter for immunization: Secondary | ICD-10-CM

## 2019-10-19 DIAGNOSIS — E538 Deficiency of other specified B group vitamins: Secondary | ICD-10-CM

## 2019-10-19 DIAGNOSIS — I10 Essential (primary) hypertension: Secondary | ICD-10-CM

## 2019-10-19 DIAGNOSIS — Z1159 Encounter for screening for other viral diseases: Secondary | ICD-10-CM

## 2019-10-19 DIAGNOSIS — E039 Hypothyroidism, unspecified: Secondary | ICD-10-CM | POA: Diagnosis not present

## 2019-10-19 DIAGNOSIS — E785 Hyperlipidemia, unspecified: Secondary | ICD-10-CM

## 2019-10-19 DIAGNOSIS — F419 Anxiety disorder, unspecified: Secondary | ICD-10-CM | POA: Diagnosis not present

## 2019-10-19 MED ORDER — HYDROCODONE-ACETAMINOPHEN 5-325 MG PO TABS: 1.0000 | ORAL_TABLET | Freq: Four times a day (QID) | ORAL | 0 refills | Status: DC | PRN

## 2019-10-19 NOTE — Addendum Note (Signed)
Addended by: Thomes Cake on: 10/19/2019 11:28 AM   Modules accepted: Orders

## 2019-10-21 LAB — CBC WITH DIFFERENTIAL/PLATELET
Absolute Monocytes: 364 cells/uL (ref 200–950)
Basophils Absolute: 51 cells/uL (ref 0–200)
Basophils Relative: 0.5 %
Eosinophils Absolute: 51 cells/uL (ref 15–500)
Eosinophils Relative: 0.5 %
HCT: 42.8 % (ref 35.0–45.0)
Hemoglobin: 14.2 g/dL (ref 11.7–15.5)
Lymphs Abs: 1010 cells/uL (ref 850–3900)
MCH: 30 pg (ref 27.0–33.0)
MCHC: 33.2 g/dL (ref 32.0–36.0)
MCV: 90.5 fL (ref 80.0–100.0)
MPV: 10.2 fL (ref 7.5–12.5)
Monocytes Relative: 3.6 %
Neutro Abs: 8625 cells/uL — ABNORMAL HIGH (ref 1500–7800)
Neutrophils Relative %: 85.4 %
Platelets: 227 10*3/uL (ref 140–400)
RBC: 4.73 10*6/uL (ref 3.80–5.10)
RDW: 13.6 % (ref 11.0–15.0)
Total Lymphocyte: 10 %
WBC: 10.1 10*3/uL (ref 3.8–10.8)

## 2019-10-21 LAB — COMPLETE METABOLIC PANEL WITH GFR
AG Ratio: 1.7 (calc) (ref 1.0–2.5)
ALT: 20 U/L (ref 6–29)
AST: 18 U/L (ref 10–35)
Albumin: 4 g/dL (ref 3.6–5.1)
Alkaline phosphatase (APISO): 80 U/L (ref 37–153)
BUN: 14 mg/dL (ref 7–25)
CO2: 27 mmol/L (ref 20–32)
Calcium: 10.3 mg/dL (ref 8.6–10.4)
Chloride: 100 mmol/L (ref 98–110)
Creat: 0.86 mg/dL (ref 0.60–0.93)
GFR, Est African American: 74 mL/min/{1.73_m2} (ref >=60)
GFR, Est Non African American: 64 mL/min/{1.73_m2} (ref >=60)
Globulin: 2.3 g/dL (calc) (ref 1.9–3.7)
Glucose, Bld: 178 mg/dL — ABNORMAL HIGH (ref 65–99)
Potassium: 4.2 mmol/L (ref 3.5–5.3)
Sodium: 137 mmol/L (ref 135–146)
Total Bilirubin: 0.8 mg/dL (ref 0.2–1.2)
Total Protein: 6.3 g/dL (ref 6.1–8.1)

## 2019-10-21 LAB — REFLEX TIQ

## 2019-10-21 LAB — VITAMIN B12: Vitamin B-12: 869 pg/mL (ref 200–1100)

## 2019-10-21 LAB — HEP C AB W/REFL
HEPATITIS C ANTIBODY REFILL$(REFL): NONREACTIVE
SIGNAL TO CUT-OFF: 0.02 (ref <1.00)

## 2019-10-28 NOTE — Progress Notes (Signed)
Cardiology Office Note:    Date:  10/29/2019   ID:  [name], DOB 08-31-1940, MRN [id number]  PCP:  Marin Olp, MD  Cardiologist:  Sinclair Grooms, MD   Referring MD: Marin Olp, MD   Chief Complaint  Patient presents with  . Coronary Artery Disease  . Hyperlipidemia  . Hypertension    History of Present Illness:    [name] is a 79 y.o. female with a hx of moderate CAD by coronary CTA 2020,gastric bypass surgery, diet controlled DM, HTN, HLD, hypothyroidism and polymalgia rheumatica, and recent hospital stay for chest pain.  Low risk myocardial perfusion imaging performed in August 2021  [name] was seen in the [facility] emergency room on August 29, 2019.  For developing atypical episode of chest pain.  Her chest pain syndrome is characterized as a sharp focal discomfort in the left parasternal area.  It makes it feel as though taking a deep breath will intensify the discomfort.  Episode can occur with activity but are more likely to occur randomly.  This episode lasted less than 10 minutes.  She did try sublingual nitroglycerin.  She is not sure that it had any impact.  There has been no recurrence since then.  Past Medical History:  Diagnosis Date  . Anemia   . Arthritis    osteoarthritis - shoulder, knees & hips  . Asthma   . Coronary artery disease   . Depression   . Diabetes mellitus without complication (HCC)    no meds  . Hypertension    Dr. Orinda Kenner manages BP, pt. reports MD has not found a need for treatment   . Hypothyroidism   . Sleep difficulties    had sleep study -2009, prior to gastric surgery, told that there was not a need for f/u  . Thyroid disease   . Vitamin B 12 deficiency     Past Surgical History:  Procedure Laterality Date  . ABDOMINAL HYSTERECTOMY    . BARIATRIC SURGERY    . CARDIAC CATHETERIZATION     [facility]- 30 yrs. ago  . CATARACT EXTRACTION, BILATERAL     late 2018  . LEFT HEART CATH AND CORONARY  ANGIOGRAPHY N/A 04/02/2018   Procedure: LEFT HEART CATH AND CORONARY ANGIOGRAPHY;  Surgeon: Martinique, Peter M, MD;  Location: [place] [facility];  Service: Cardiovascular;  Laterality: N/A;  . OOPHORECTOMY    . pantallor arthrodesis with rod placement left foot    . TONSILLECTOMY    . TOTAL KNEE ARTHROPLASTY Left 11/30/2018   Procedure: TOTAL KNEE ARTHROPLASTY;  Surgeon: Gaynelle Arabian, MD;  Location: [facility];  Service: Orthopedics;  Laterality: Left;  . TOTAL KNEE ARTHROPLASTY Right 06/28/2019   Procedure: TOTAL KNEE ARTHROPLASTY;  Surgeon: Gaynelle Arabian, MD;  Location: [facility];  Service: Orthopedics;  Laterality: Right;  25mn  . TOTAL SHOULDER ARTHROPLASTY Left 03/12/2012   Procedure: TOTAL SHOULDER ARTHROPLASTY;  Surgeon: KMarin Shutter MD;  Location: [facility]  Service: Orthopedics;  Laterality: Left;    Current Medications: Current Meds  Medication Sig  . acetaminophen (TYLENOL) 500 MG tablet Take 1,000 mg by mouth every 8 (eight) hours as needed for mild pain or headache.  . albuterol (VENTOLIN HFA) 108 (90 Base) MCG/ACT inhaler Inhale 1-2 puffs into the lungs every 4 (four) hours as needed for wheezing or shortness of breath.  .Marland KitchenamLODipine (NORVASC) 10 MG tablet TAKE 1 TABLET EVERY DAY (Patient taking differently: Take 10 mg by  mouth at bedtime. )  . aspirin EC 81 MG tablet Take 81 mg by mouth daily. Swallow whole.  . Blood Glucose Monitoring Suppl (TRUE METRIX AIR GLUCOSE METER) w/Device KIT USE AS DIRECTED  . Cholecalciferol (VITAMIN D-3) 125 MCG (5000 UT) TABS Take 5,000 Units by mouth daily.  . cyanocobalamin (,VITAMIN B-12,) 1000 MCG/ML injection Inject 1 mL (1,000 mcg total) into the muscle every 30 (thirty) days. Uses on amonthly basis (Patient taking differently: Inject 1,000 mcg into the muscle every 30 (thirty) days. )  . desvenlafaxine (PRISTIQ) 100 MG 24 hr tablet TAKE 1 TABLET EVERY DAY (Patient taking differently: Take 100 mg by mouth daily. )  . diclofenac (VOLTAREN) 75 MG EC  tablet Take 75 mg by mouth daily. Not taking while on prednisone  . Evolocumab (REPATHA SURECLICK) 478 MG/ML SOAJ Inject 1 pen into the skin every 14 (fourteen) days. (Patient taking differently: Inject 140 mg into the skin every 14 (fourteen) days. )  . FeFum-FePo-FA-B Cmp-C-Zn-Mn-Cu (SE-TAN PLUS) 162-115.2-1 MG CAPS TAKE ONE CAPSULE BY MOUTH TWICE A DAY  . fexofenadine (ALLEGRA) 180 MG tablet Take 180 mg by mouth daily as needed for allergies or rhinitis.  . fluticasone (FLONASE) 50 MCG/ACT nasal spray USE 1 SPRAY IN EACH NOSTRIL EVERY DAY (Patient taking differently: Place 2 sprays into both nostrils daily. )  . glucose blood test strip Test blood sugar once per day  . HYDROcodone-acetaminophen (NORCO/VICODIN) 5-325 MG tablet Take 1 tablet by mouth every 6 (six) hours as needed for moderate pain.  . Levothyroxine Sodium (TIROSINT) 150 MCG CAPS Take 300 mcg by mouth daily before breakfast.   . loperamide (IMODIUM) 2 MG capsule Take 4 mg by mouth daily as needed for diarrhea or loose stools.   . montelukast (SINGULAIR) 10 MG tablet TAKE 1 TABLET EVERY DAY WITH BREAKFAST  . nitroGLYCERIN (NITROSTAT) 0.4 MG SL tablet Place 1 tablet (0.4 mg total) under the tongue every 5 (five) minutes as needed for chest pain.  . Omega-3 Fatty Acids (FISH OIL) 1000 MG CAPS Take 1,000 mg by mouth daily.   . predniSONE (DELTASONE) 5 MG tablet Take 4-5 pills daily for polymyalgia rheumatica (Patient taking differently: Take 25 mg by mouth daily with breakfast. )  . Propylene Glycol (SYSTANE COMPLETE OP) Place 1 drop into both eyes in the morning.   . TRUEplus Lancets 30G MISC Check blood sugar once per day     Allergies:   Flexeril [cyclobenzaprine], Ace inhibitors, Codeine, Singulair [montelukast], and Azithromycin   Social History   Socioeconomic History  . Marital status: Widowed    Spouse name: Not on file  . Number of children: Not on file  . Years of education: Not on file  . Highest education level: Not  on file  Occupational History  . Occupation: retired Optician, dispensing: RETIRED  Tobacco Use  . Smoking status: Former Smoker    Packs/day: 0.70    Years: 20.00    Pack years: 14.00    Quit date: 03/04/1978    Years since quitting: 41.6  . Smokeless tobacco: Never Used  Vaping Use  . Vaping Use: Never used  Substance and Sexual Activity  . Alcohol use: Yes    Alcohol/week: 2.0 standard drinks    Types: 2 Glasses of wine per week    Comment: with dinner  or social rare -   . Drug use: No  . Sexual activity: Not Currently  Other Topics Concern  . Not on file  Social History Narrative   Widowed 2013. 2 kids. 4 grandkids. Oldest daughter lives in [place] and has 2 grandkids that are now in Scientist, physiological, PT school at [employer].       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 Difficulty of Paying Living Expenses: Not on file  Food Insecurity:   . Worried About Charity fundraiser in the Last Year: Not on file  . Ran Out of Food in the Last Year: Not on file  Transportation Needs:   . Lack of Transportation (Medical): Not on file  . Lack of Transportation (Non-Medical): Not on file  Physical Activity:   . Days of Exercise per Week: Not on file  . Minutes of Exercise per Session: Not on file  Stress:   . Feeling of Stress : Not on file  Social Connections:   . Frequency of Communication with Friends and Family: Not on file  . Frequency of Social Gatherings with Friends and Family: Not on file  . Attends Religious Services: Not on file  . Active Member of Clubs or Organizations: Not on file  . Attends Archivist Meetings: Not on file  . Marital Status: Not on file     Family History: The patient's family history includes Cancer in her father; Liver cancer in her father; Stroke in her mother. There is no  history of Breast cancer.  ROS:   Please see the history of present illness.    She is not very concerned about the discomfort.  This is become more of a chronic recurring situation that does not appear to be cardiac.  All other systems reviewed and are negative.  EKGs/Labs/Other Studies Reviewed:    The following studies were reviewed today: Multiple cardiac markers were obtained in August and were negative for ischemia with troponin high 7x2..  EKG:  EKG performed August 29, 2019 revealed normal sinus rhythm and normal appearance.  No EKG performed today.  Recent Labs: 01/01/2019: TSH 3.02 10/19/2019: ALT 20; BUN 14; Creat 0.86; Hemoglobin 14.2; Platelets 227; Potassium 4.2; Sodium 137  Recent Lipid Panel    Component Value Date/Time   CHOL 150 08/30/2019 0624   CHOL 114 01/11/2019 1012   TRIG 96 08/30/2019 0624   HDL 82 08/30/2019 0624   HDL 53 01/11/2019 1012   CHOLHDL 1.8 08/30/2019 0624   VLDL 19 08/30/2019 0624   LDLCALC 49 08/30/2019 0624   LDLCALC 34 01/11/2019 1012   LDLDIRECT 60.0 05/31/2019 1355    Physical Exam:    VS:  BP 126/80   Pulse 73   Ht _0  (1.549 m)   Wt 214 lb 3.2 oz (97.2 kg)   SpO2 97%   BMI 40.47 kg/m     Wt Readings from Last 3 Encounters:  10/29/19 214 lb 3.2 oz (97.2 kg)  10/19/19 211 lb 12.8 oz (96.1 kg)  09/03/19 205 lb (93 kg)     GEN: Moderate obesity.. No acute distress HEENT: Normal NECK: No JVD. LYMPHATICS: No lymphadenopathy CARDIAC:  RRR without murmur, gallop, or edema. VASCULAR:  Normal Pulses. No bruits. RESPIRATORY:  Clear to auscultation without rales, wheezing or rhonchi  ABDOMEN: Soft, non-tender, non-distended, No pulsatile mass, MUSCULOSKELETAL: No deformity  SKIN: Warm and dry NEUROLOGIC:  Alert and oriented x 3 PSYCHIATRIC:  Normal affect  ASSESSMENT:    1. Coronary artery disease of native artery of native heart with stable angina pectoris ([place])   2. Essential hypertension   3. Hyperlipidemia,  unspecified hyperlipidemia type   4. Morbid obesity ([place])   5. Well controlled type 2 diabetes mellitus ([place])   23. Educated about COVID-19 virus infection    PLAN:    In order of problems listed above:  1. Primary prevention discussed with the patient.  The importance of aerobic activity is stressed. 2. Continue Norvasc 10 mg/day.  Target 140/80 mmHg. 3. Continue Repatha 140 mg/mL every 14 days.  Last LDL 49. 4. Weight loss is encouraged 5. Hemoglobin A1c 6.6 in August 2021.  Low carbohydrate diet.  Consider SGLT2 therapy. 6. Vaccinated and practicing social distancing.  Overall education and awareness concerning primary risk prevention was discussed in detail: LDL less than 70, hemoglobin A1c less than 7, blood pressure target less than 130/80 mmHg, >150 minutes of moderate aerobic activity per week, avoidance of smoking, weight control (via diet and exercise), and continued surveillance/management of/for obstructive sleep apnea.    Medication Adjustments/Labs and Tests Ordered: Current medicines are reviewed at length with the patient today.  Concerns regarding medicines are outlined above.  No orders of the defined types were placed in this encounter.  No orders of the defined types were placed in this encounter.   Patient Instructions  Medication Instructions:  Your physician recommends that you continue on your current medications as directed. Please refer to the Current Medication list given to you today.  *If you need a refill on your cardiac medications before your next appointment, please call your pharmacy*   Lab Work: None If you have labs (blood work) drawn today and your tests are completely normal, you will receive your results only by: Marland Kitchen MyChart Message (if you have MyChart) OR . A paper copy in the mail If you have any lab test that is abnormal or we need to change your treatment, we will call you to review the  results.   Testing/Procedures: None   Follow-Up: At [facility], you and your health needs are our priority.  As part of our continuing mission to provide you with exceptional heart care, we have created designated Provider Care Teams.  These Care Teams include your primary Cardiologist (physician) and Advanced Practice Providers (APPs -  Physician Assistants and Nurse Practitioners) who all work together to provide you with the care you need, when you need it.  We recommend signing up for the patient portal called "MyChart".  Sign up information is provided on this After Visit Summary.  MyChart is used to connect with patients for Virtual Visits (Telemedicine).  Patients are able to view lab/test results, encounter notes, upcoming appointments, etc.  Non-urgent messages can be sent to your provider as well.   To learn more about what you can do with MyChart, go to [email or web address].    Your next appointment:   12 month(s)  The format for your next appointment:   In Person  Provider:   You may see Sinclair Grooms, MD or one of the following Advanced Practice Providers on your designated Care Team:    Truitt Merle, NP  Cecilie Kicks, NP  Kathyrn Drown, NP    Other Instructions       Signed, Sinclair Grooms, MD  10/29/2019 1:10 PM    [place]

## 2019-10-29 ENCOUNTER — Other Ambulatory Visit: Payer: Self-pay

## 2019-10-29 ENCOUNTER — Encounter: Payer: Self-pay | Admitting: Interventional Cardiology

## 2019-10-29 ENCOUNTER — Ambulatory Visit: Payer: Medicare HMO | Admitting: Interventional Cardiology

## 2019-10-29 VITALS — BP 126/80 | HR 73 | Ht 61.0 in | Wt 214.2 lb

## 2019-10-29 DIAGNOSIS — I10 Essential (primary) hypertension: Secondary | ICD-10-CM

## 2019-10-29 DIAGNOSIS — Z7189 Other specified counseling: Secondary | ICD-10-CM

## 2019-10-29 DIAGNOSIS — E119 Type 2 diabetes mellitus without complications: Secondary | ICD-10-CM | POA: Diagnosis not present

## 2019-10-29 DIAGNOSIS — E785 Hyperlipidemia, unspecified: Secondary | ICD-10-CM

## 2019-10-29 DIAGNOSIS — I25118 Atherosclerotic heart disease of native coronary artery with other forms of angina pectoris: Secondary | ICD-10-CM | POA: Diagnosis not present

## 2019-10-29 NOTE — Patient Instructions (Addendum)

## 2019-11-01 ENCOUNTER — Other Ambulatory Visit: Payer: Self-pay | Admitting: Pharmacist

## 2019-11-01 DIAGNOSIS — I25118 Atherosclerotic heart disease of native coronary artery with other forms of angina pectoris: Secondary | ICD-10-CM

## 2019-11-01 MED ORDER — REPATHA SURECLICK 140 MG/ML ~~LOC~~ SOAJ: 1.0000 "pen " | SUBCUTANEOUS | 0 refills | Status: DC

## 2019-11-18 ENCOUNTER — Encounter: Payer: Self-pay | Admitting: Family Medicine

## 2019-11-19 NOTE — Progress Notes (Signed)
Phone [phone] In person visit   Subjective:   [name] is a 79 y.o. year old very pleasant female patient who presents for/with See problem oriented charting Chief Complaint  Patient presents with  . Sinusitis    This visit occurred during the SARS-CoV-2 public health emergency.  Safety protocols were in place, including screening questions prior to the visit, additional usage of staff PPE, and extensive cleaning of exam room while observing appropriate contact time as indicated for disinfecting solutions.   Past Medical History-  Patient Active Problem List   Diagnosis Date Noted  . Immunocompromised state ([place]) 09/03/2019    Priority: High  . Polymyalgia rheumatica ([place]) 03/16/2018    Priority: High  . CAD (coronary artery disease) 11/19/2017    Priority: High  . Hypercalcemia 12/29/2014    Priority: High  . OA (osteoarthritis) of knee 10/05/2013    Priority: High  . Well controlled type 2 diabetes mellitus ([place]) 07/18/2006    Priority: High  . B12 deficiency 05/31/2019    Priority: Medium  . Overactive bladder 09/03/2016    Priority: Medium  . Morbid obesity ([place]) 06/28/2015    Priority: Medium  . History of gastric bypass 02/08/2014    Priority: Medium  . Hyperlipidemia associated with type 2 diabetes mellitus ([place]) 01/05/2008    Priority: Medium  . Hypothyroidism 09/09/2007    Priority: Medium  . Major depression, recurrent, full remission ([facility]) 07/18/2006    Priority: Medium  . Hypertension associated with diabetes ([place]) 07/18/2006    Priority: Medium  . Asthma 07/18/2006    Priority: Medium  . Allergic rhinitis 07/20/2014    Priority: Low  . CHEST WALL PAIN, ANTERIOR 10/09/2007    Priority: Low  . Primary osteoarthritis of right knee 06/28/2019  . Coronary artery disease of native artery of native heart with stable angina pectoris ([place]) 08/31/2018  . Chest pain with high risk for cardiac etiology 04/02/2018  . Hypercholesteremia   .  Anxiety 07/17/2017  . Osteopenia of left hip 07/17/2017    Medications- reviewed and updated Current Outpatient Medications  Medication Sig Dispense Refill  . acetaminophen (TYLENOL) 500 MG tablet Take 1,000 mg by mouth every 8 (eight) hours as needed for mild pain or headache.    . albuterol (VENTOLIN HFA) 108 (90 Base) MCG/ACT inhaler Inhale 1-2 puffs into the lungs every 4 (four) hours as needed for wheezing or shortness of breath. 18 g 2  . amLODipine (NORVASC) 10 MG tablet TAKE 1 TABLET EVERY DAY (Patient taking differently: Take 10 mg by mouth at bedtime. ) 90 tablet 0  . aspirin EC 81 MG tablet Take 81 mg by mouth daily. Swallow whole.    . Blood Glucose Monitoring Suppl (TRUE METRIX AIR GLUCOSE METER) w/Device KIT USE AS DIRECTED 1 kit 0  . Cholecalciferol (VITAMIN D-3) 125 MCG (5000 UT) TABS Take 5,000 Units by mouth daily.    . cyanocobalamin (,VITAMIN B-12,) 1000 MCG/ML injection Inject 1 mL (1,000 mcg total) into the muscle every 30 (thirty) days. Uses on amonthly basis (Patient taking differently: Inject 1,000 mcg into the muscle every 30 (thirty) days. ) 10 mL 0  . desvenlafaxine (PRISTIQ) 100 MG 24 hr tablet TAKE 1 TABLET EVERY DAY (Patient taking differently: Take 100 mg by mouth daily. ) 90 tablet 0  . diclofenac (VOLTAREN) 75 MG EC tablet Take 75 mg by mouth daily. Not taking while on prednisone    . Evolocumab (REPATHA SURECLICK) 622 MG/ML SOAJ Inject 1 pen  into the skin every 14 (fourteen) days. 6 mL 0  . FeFum-FePo-FA-B Cmp-C-Zn-Mn-Cu (SE-TAN PLUS) 162-115.2-1 MG CAPS TAKE ONE CAPSULE BY MOUTH TWICE A DAY 180 capsule 0  . fexofenadine (ALLEGRA) 180 MG tablet Take 180 mg by mouth daily as needed for allergies or rhinitis.    . fluticasone (FLONASE) 50 MCG/ACT nasal spray USE 1 SPRAY IN EACH NOSTRIL EVERY DAY (Patient taking differently: Place 2 sprays into both nostrils daily. ) 32 g 0  . glucose blood test strip Test blood sugar once per day 100 each 3  .  HYDROcodone-acetaminophen (NORCO/VICODIN) 5-325 MG tablet Take 1 tablet by mouth every 6 (six) hours as needed for moderate pain. 20 tablet 0  . Levothyroxine Sodium (TIROSINT) 150 MCG CAPS Take 300 mcg by mouth daily before breakfast.     . loperamide (IMODIUM) 2 MG capsule Take 4 mg by mouth daily as needed for diarrhea or loose stools.     . montelukast (SINGULAIR) 10 MG tablet TAKE 1 TABLET EVERY DAY WITH BREAKFAST 90 tablet 3  . nitroGLYCERIN (NITROSTAT) 0.4 MG SL tablet Place 1 tablet (0.4 mg total) under the tongue every 5 (five) minutes as needed for chest pain. 50 tablet 3  . Omega-3 Fatty Acids (FISH OIL) 1000 MG CAPS Take 1,000 mg by mouth daily.     . predniSONE (DELTASONE) 5 MG tablet Take 4-5 pills daily for polymyalgia rheumatica (Patient taking differently: Take 25 mg by mouth daily with breakfast. ) 150 tablet 5  . Propylene Glycol (SYSTANE COMPLETE OP) Place 1 drop into both eyes in the morning.     . TRUEplus Lancets 30G MISC Check blood sugar once per day 100 each 3  . amoxicillin-clavulanate (AUGMENTIN) 875-125 MG tablet Take 1 tablet by mouth 2 (two) times daily. 20 tablet 0   No current facility-administered medications for this visit.     Objective:  BP 124/80   Pulse 68   Temp 98 F (36.7 C) (Temporal)   Resp 18   Ht 5' 1"  (1.549 m)   Wt 216 lb 6.4 oz (98.2 kg)   SpO2 97%   BMI 40.89 kg/m  Gen: NAD, resting comfortably TM normal. Left frontal sinus tenderness and right maxillary sinus tenderness. Large left nasal turbinate with clear rhinorrhea.  CV: RRR no murmurs rubs or gallops Lungs: CTAB no crackles, wheeze, rhonchi Abdomen: soft/nontender/nondistended/normal bowel sounds. No rebound or guarding.  Ext: no edema Skin: warm, dry    Assessment and Plan  Sinus Issues S:Patient mentioned that she has been having a sinus headache for 2 and a half weeks, she feels pain and pressure behind her eyes. Also gets some pain with chewing- worse on left.  Tylenol is  able to relieve her pain but then it comes back- headaches come and go. Clear mucus but no green/yellow drainage. States in general rarely has headaches. In past with sinus issues has hurt in maxillary sinuses but this is more behind and above the eyes. No fever or chills. Not much cough. No sore throat. Intermittent allegra. No new blurry vision or double vision.   She is down to 12.5 mg on prednisone a week as of today for PMR- only sparing neck tension. prednisone could contribute to headaches A/P: potential bacterial sinus infection with nearly 3 weeks of symptoms and not improving. Treatment with : -augmentin for 10 days -schedule allegra daily for 2 weeks.  -sinus rinses twice a day- make sure to follow instructions for water  -give me an  update at 10-14 days. If not improving consider next steps such as neuroimaging if no improvement on antibiotics  Recommended follow up: Reschedule December visit to 3 months from now. See me sooner if need help with the taper but you are doing great!  Future Appointments  Date Time Provider [place]  12/22/2019  8:40 AM Marin Olp, MD [facility]   Lab/Order associations:   ICD-10-CM   1. Bacterial sinusitis  J32.9    B96.89     Meds ordered this encounter  Medications  . amoxicillin-clavulanate (AUGMENTIN) 875-125 MG tablet    Sig: Take 1 tablet by mouth 2 (two) times daily.    Dispense:  20 tablet    Refill:  0   Return precautions advised.  Garret Reddish, MD

## 2019-11-19 NOTE — Patient Instructions (Addendum)
potential bacterial sinus infection with nearly 3 weeks of symptoms and not improving. Treatment with : -augmentin for 10 days -schedule allegra daily for 2 weeks.  -sinus rinses twice a day- make sure to follow instructions for water  -give me an update at 10-14 days. If not improving consider next steps such as neuroimaging if no improvement on antibiotics  Team- Please log pfizer booster 09/04/2019  I would repeat booster 6 months out from 09/04/19- so March 07 2019  Reschedule December visit to 3 months from now. See me sooner if need help with the taper but you are doing great!

## 2019-11-24 ENCOUNTER — Other Ambulatory Visit: Payer: Self-pay

## 2019-11-24 ENCOUNTER — Encounter: Payer: Self-pay | Admitting: Family Medicine

## 2019-11-24 ENCOUNTER — Ambulatory Visit (INDEPENDENT_AMBULATORY_CARE_PROVIDER_SITE_OTHER): Payer: Medicare HMO | Admitting: Family Medicine

## 2019-11-24 VITALS — BP 124/80 | HR 68 | Temp 98.0°F | Resp 18 | Ht 61.0 in | Wt 216.4 lb

## 2019-11-24 DIAGNOSIS — B9689 Other specified bacterial agents as the cause of diseases classified elsewhere: Secondary | ICD-10-CM

## 2019-11-24 DIAGNOSIS — J329 Chronic sinusitis, unspecified: Secondary | ICD-10-CM

## 2019-11-24 MED ORDER — AMOXICILLIN-POT CLAVULANATE 875-125 MG PO TABS
1.0000 | ORAL_TABLET | Freq: Two times a day (BID) | ORAL | 0 refills | Status: DC
Start: 2019-11-24 — End: 2020-03-09

## 2019-12-03 ENCOUNTER — Encounter: Payer: Self-pay | Admitting: Family Medicine

## 2019-12-13 ENCOUNTER — Other Ambulatory Visit: Payer: Self-pay

## 2019-12-13 ENCOUNTER — Encounter: Payer: Self-pay | Admitting: Family Medicine

## 2019-12-13 MED ORDER — PREDNISONE 5 MG PO TABS
ORAL_TABLET | ORAL | 0 refills | Status: DC
Start: 2019-12-13 — End: 2020-03-09

## 2019-12-14 ENCOUNTER — Other Ambulatory Visit: Payer: Self-pay

## 2019-12-14 MED ORDER — PREDNISONE 1 MG PO TABS: ORAL_TABLET | ORAL | 0 refills | Status: DC

## 2019-12-14 MED ORDER — TRUEPLUS LANCETS 30G MISC: 3 refills | Status: AC

## 2019-12-14 MED ORDER — GLUCOSE BLOOD VI STRP: ORAL_STRIP | 3 refills | Status: AC

## 2019-12-22 ENCOUNTER — Ambulatory Visit: Payer: Medicare HMO | Admitting: Family Medicine

## 2019-12-24 DIAGNOSIS — H524 Presbyopia: Secondary | ICD-10-CM | POA: Diagnosis not present

## 2019-12-24 DIAGNOSIS — E119 Type 2 diabetes mellitus without complications: Secondary | ICD-10-CM | POA: Diagnosis not present

## 2019-12-24 DIAGNOSIS — H02403 Unspecified ptosis of bilateral eyelids: Secondary | ICD-10-CM | POA: Diagnosis not present

## 2019-12-24 DIAGNOSIS — H18593 Other hereditary corneal dystrophies, bilateral: Secondary | ICD-10-CM | POA: Diagnosis not present

## 2019-12-24 LAB — HM DIABETES EYE EXAM

## 2019-12-30 ENCOUNTER — Other Ambulatory Visit: Payer: Self-pay | Admitting: Family Medicine

## 2019-12-30 MED ORDER — AMLODIPINE BESYLATE 10 MG PO TABS
10.0000 mg | ORAL_TABLET | Freq: Every day | ORAL | 0 refills | Status: DC
Start: 2019-12-30 — End: 2020-02-25

## 2020-01-19 DIAGNOSIS — I25118 Atherosclerotic heart disease of native coronary artery with other forms of angina pectoris: Secondary | ICD-10-CM

## 2020-01-19 MED ORDER — REPATHA SURECLICK 140 MG/ML ~~LOC~~ SOAJ: 1.0000 "pen " | SUBCUTANEOUS | 3 refills | Status: AC

## 2020-02-25 ENCOUNTER — Telehealth: Payer: Self-pay

## 2020-02-25 MED ORDER — AMLODIPINE BESYLATE 10 MG PO TABS
10.0000 mg | ORAL_TABLET | Freq: Every day | ORAL | 2 refills | Status: DC
Start: 2020-02-25 — End: 2020-11-20

## 2020-02-25 MED ORDER — DESVENLAFAXINE SUCCINATE ER 100 MG PO TB24
100.0000 mg | ORAL_TABLET | Freq: Every day | ORAL | 0 refills | Status: DC
Start: 2020-02-25 — End: 2020-05-10

## 2020-02-25 MED ORDER — DICLOFENAC SODIUM 75 MG PO TBEC
75.0000 mg | DELAYED_RELEASE_TABLET | Freq: Every day | ORAL | 2 refills | Status: DC
Start: 2020-02-25 — End: 2020-03-09

## 2020-02-25 NOTE — Telephone Encounter (Signed)
  LAST APPOINTMENT DATE: 11/24/2019  NEXT APPOINTMENT DATE:@2 /17/2022  MEDICATION: amLODipine (NORVASC) 10 MG tablet //  desvenlafaxine (PRISTIQ) 100 MG 24 hr tablet // diclofenac (VOLTAREN) 75 MG EC tablet  PHARMACY: [place], [place]

## 2020-02-25 NOTE — Telephone Encounter (Signed)
Medications sent to pharmacy

## 2020-03-08 NOTE — Patient Instructions (Addendum)
Please stop by lab before you go If you have mychart- we will send your results within 3 business days of [place] receiving them.  If you do not have mychart- we will call you about results within 5 business days of [place] receiving them.  *please also note that you will see labs on mychart as soon as they post. I will later go in and write notes on them- will say "notes from Dr. Yong Channel"  Team please abstract hep c screen from 10/19/19- its in epic - did not cross over to health maintainence  Recommended follow up: Return in about 4 months (around 07/07/2020) for follow up- or sooner if needed.

## 2020-03-08 NOTE — Progress Notes (Signed)
Phone [phone] In person visit   Subjective:   [name] is a 80 y.o. year old very pleasant female patient who presents for/with See problem oriented charting Chief Complaint  Patient presents with  . Hypothyroidism  . Hypertension  . Hyperlipidemia  . Diabetes  . Asthma  . B12 Deficiency    This visit occurred during the SARS-CoV-2 public health emergency.  Safety protocols were in place, including screening questions prior to the visit, additional usage of staff PPE, and extensive cleaning of exam room while observing appropriate contact time as indicated for disinfecting solutions.   Past Medical History-  Patient Active Problem List   Diagnosis Date Noted  . History of polymyalgia rheumatica 03/16/2018    Priority: High  . CAD (coronary artery disease) 11/19/2017    Priority: High  . Hypercalcemia 12/29/2014    Priority: High  . OA (osteoarthritis) of knee 10/05/2013    Priority: High  . Well controlled type 2 diabetes mellitus ([place]) 07/18/2006    Priority: High  . B12 deficiency 05/31/2019    Priority: Medium  . Overactive bladder 09/03/2016    Priority: Medium  . Morbid obesity ([place]) 06/28/2015    Priority: Medium  . History of gastric bypass 02/08/2014    Priority: Medium  . Hyperlipidemia associated with type 2 diabetes mellitus ([place]) 01/05/2008    Priority: Medium  . Hypothyroidism 09/09/2007    Priority: Medium  . Major depression, recurrent, full remission ([place]) 07/18/2006    Priority: Medium  . Hypertension associated with diabetes ([place]) 07/18/2006    Priority: Medium  . Asthma 07/18/2006    Priority: Medium  . Allergic rhinitis 07/20/2014    Priority: Low  . CHEST WALL PAIN, ANTERIOR 10/09/2007    Priority: Low  . Primary osteoarthritis of right knee 06/28/2019  . Chest pain with high risk for cardiac etiology 04/02/2018  . Hypercholesteremia   . Anxiety 07/17/2017  . Osteopenia of left hip 07/17/2017    Medications- reviewed  and updated Current Outpatient Medications  Medication Sig Dispense Refill  . acetaminophen (TYLENOL) 500 MG tablet Take 1,000 mg by mouth every 8 (eight) hours as needed for mild pain or headache.    . albuterol (VENTOLIN HFA) 108 (90 Base) MCG/ACT inhaler Inhale 1-2 puffs into the lungs every 4 (four) hours as needed for wheezing or shortness of breath. 18 g 2  . amLODipine (NORVASC) 10 MG tablet Take 1 tablet (10 mg total) by mouth daily. 90 tablet 2  . aspirin EC 81 MG tablet Take 81 mg by mouth daily. Swallow whole.    . Cholecalciferol (VITAMIN D-3) 125 MCG (5000 UT) TABS Take 5,000 Units by mouth daily.    . cyanocobalamin (,VITAMIN B-12,) 1000 MCG/ML injection Inject 1 mL (1,000 mcg total) into the muscle every 30 (thirty) days. Uses on amonthly basis (Patient taking differently: Inject 1,000 mcg into the muscle every 30 (thirty) days.) 10 mL 0  . desvenlafaxine (PRISTIQ) 100 MG 24 hr tablet Take 1 tablet (100 mg total) by mouth daily. 90 tablet 0  . Evolocumab (REPATHA SURECLICK) 580 MG/ML SOAJ Inject 1 pen into the skin every 14 (fourteen) days. 6 mL 3  . FeFum-FePo-FA-B Cmp-C-Zn-Mn-Cu (SE-TAN PLUS) 162-115.2-1 MG CAPS TAKE ONE CAPSULE BY MOUTH TWICE A DAY 180 capsule 0  . fexofenadine (ALLEGRA) 180 MG tablet Take 180 mg by mouth daily as needed for allergies or rhinitis.    . fluticasone (FLONASE) 50 MCG/ACT nasal spray USE 1 SPRAY IN EACH NOSTRIL  EVERY DAY (Patient taking differently: Place 2 sprays into both nostrils daily.) 32 g 0  . Levothyroxine Sodium 150 MCG CAPS Take 300 mcg by mouth daily before breakfast.     . loperamide (IMODIUM) 2 MG capsule Take 4 mg by mouth daily as needed for diarrhea or loose stools.     . montelukast (SINGULAIR) 10 MG tablet TAKE 1 TABLET EVERY DAY WITH BREAKFAST 90 tablet 3  . nitroGLYCERIN (NITROSTAT) 0.4 MG SL tablet Place 1 tablet (0.4 mg total) under the tongue every 5 (five) minutes as needed for chest pain. 50 tablet 3  . Omega-3 Fatty Acids  (FISH OIL) 1000 MG CAPS Take 1,000 mg by mouth daily.     Marland Kitchen Propylene Glycol (SYSTANE COMPLETE OP) Place 1 drop into both eyes in the morning.    . Blood Glucose Monitoring Suppl (TRUE METRIX AIR GLUCOSE METER) w/Device KIT USE AS DIRECTED (Patient not taking: Reported on 03/09/2020) 1 kit 0  . diclofenac (VOLTAREN) 75 MG EC tablet Take 1 tablet (75 mg total) by mouth daily. 180 tablet 3  . glucose blood test strip Test blood sugar once per day (Patient not taking: Reported on 03/09/2020) 100 each 3  . HYDROcodone-acetaminophen (NORCO/VICODIN) 5-325 MG tablet Take 1 tablet by mouth every 6 (six) hours as needed for moderate pain. 20 tablet 0  . TRUEplus Lancets 30G MISC Check blood sugar once per day (Patient not taking: Reported on 03/09/2020) 100 each 3   No current facility-administered medications for this visit.     Objective:  BP (!) 148/84   Pulse 61   Temp 98 F (36.7 C) (Temporal)   Ht 5' 1"  (1.549 m)   Wt 221 lb 6.4 oz (100.4 kg)   SpO2 95%   BMI 41.83 kg/m  Gen: NAD, resting comfortably CV: RRR no murmurs rubs or gallops Lungs: CTAB no crackles, wheeze, rhonchi Ext: Trace edema Skin: warm, dry     Assessment and Plan   #social update- friend in [place] and recovering some with aricept but still dealing with multiple myeloma issues- sounds  Like on palliative  -going to [place] for her 80th birthday!   # PMR S:patient came off prednisone back in December- was dealing with a lot of fatigue and had some worsening of pain in shoulders and hips and muscle weakness- now doing much better  A/P: we will list this as history of PMR as able to tolerate off prednisone- will continue to monitor- hopeful to see improvement in coming months further further out from prednisone.   #eyelid drooping/trouble opening eyes fully/some blurry vision- better with lifting eyelid manually - also had some eyelid swelling with eye exam- eye doctor thought it was oil collection- she tried  massage without significant benefit -has an appointment with [facility]- I think this is a great idea- referral placed today for their expert opinion -does not sound like temporal arteritis as vision normal when lifts the eyelid  #CAD- no chest pain or shortness of breath. Cath in 2020. Still on aspirin.  #hyperlipidemia S: Medication: repatha every 14 days  Lab Results  Component Value Date   CHOL 150 08/30/2019   HDL 82 08/30/2019   LDLCALC 49 08/30/2019   LDLDIRECT 60.0 05/31/2019   TRIG 96 08/30/2019   CHOLHDL 1.8 08/30/2019   A/P: Cad asymptomatic. Lipids well controlled. Continue current meds  #hypothyroidism- follows with Dr. altheimer- defers labs - compliant On thyroid medication- Tirosint 150Mg Lab Results  Component Value Date  TSH 3.02 01/01/2019    # Diabetes S: Medication:none  CBGs- not checking recently- a lot of fluctuations with prednisone Exercise and diet- up 5 lbs - struggling with sweets recently Lab Results  Component Value Date   HGBA1C 6.6 (H) 08/30/2019   HGBA1C 6.0 05/31/2019   HGBA1C 6.1 (H) 11/25/2018   A/P: hopefully stable- update a1c today.  # Asthma S: Maintenance Medication: Singulair 10Mg As needed medication: albuterol. Patient is using this 0 per week.  A/P: Stable. Continue current medications.    #hypertension S: medication: Amlodipine 10Mg BP Readings from Last 3 Encounters:  03/09/20 (!) 148/84  11/24/19 124/80  10/29/19 126/80  A/P: home #s 120s-130s when checked recently- hair high today- she will do some home monitoring and let us know if averaging over 135/85- with good home prior control and last 2 readings looking so good I do not think we need to increase meds at this time.   #b12 doing every other month- update levels today  # Depression S: Medication:pristiq 144m  [facility] 03/09/2020 09/03/2019 05/31/2019  Decreased Interest 3 0 0  Down, Depressed, Hopeless 0 0 0  PHQ - 2 Score 3 0 0   Altered sleeping 3 0 1  Tired, decreased energy 3 3 3   Change in appetite 3 3 0  Feeling bad or failure about yourself  0 0 0  Trouble concentrating 1 1 0  Moving slowly or fidgety/restless 0 0 0  Suicidal thoughts 0 0 0  PHQ-9 Score 13 7 4   Difficult doing work/chores Not difficult at all Somewhat difficult Not difficult at all  Some recent data might be hidden  A/P: a  Lot of her score is likely related to coming off prednisone- she will monitor and let [place] if worsens or fails to improve. Plus some [place] I still believe underlying depression is controlled and these are more situational stressors- no med changes planned. Patient ok with this  # Bilateral knee pain- back on diclofenac twice a day on bad days. Will monitor renal function. Also helps her low back. Last hydrocodone fill in September #20- uses sparingly as back up for pain.  -diclofenac can worsen BP but given good home readings- will still refill and she will check at home for [place] # she would prefer to have 2nd booster- now off prednisone does not have formal indication  Recommended follow up: Return in about 4 months (around 07/07/2020) for follow up- or sooner if needed. Future Appointments  Date Time Provider [place] 07/11/2020 10:00 AM HMarin Olp MD [facility]    Lab/Order associations:   ICD-10-CM   1. Hypertension associated with diabetes ([place]  E11.59    I15.2   2. Well controlled type 2 diabetes mellitus ([place]  E11.9 Hemoglobin A1c    Comprehensive metabolic panel  3. Hypothyroidism, unspecified type  E03.9   4. Hyperlipidemia associated with type 2 diabetes mellitus ([place]  E11.69    E78.5   5. Anxiety  F41.9   6. B12 deficiency  E53.8 Vitamin B12  7. Encounter for hepatitis C screening test for low risk patient  Z11.59   8. Eyelid abnormality  H02.9 Ambulatory referral to Ophthalmology  9. Vision loss  H54.7 Ambulatory referral to Ophthalmology   10. Major depression, recurrent, full remission ([place] Chronic F33.42   11. History of polymyalgia rheumatica  Z87.39   12. Coronary artery disease involving native coronary artery  of native heart without angina pectoris  I25.10     Meds ordered this encounter  Medications  . diclofenac (VOLTAREN) 75 MG EC tablet    Sig: Take 1 tablet (75 mg total) by mouth daily.    Dispense:  180 tablet    Refill:  3  . HYDROcodone-acetaminophen (NORCO/VICODIN) 5-325 MG tablet    Sig: Take 1 tablet by mouth every 6 (six) hours as needed for moderate pain.    Dispense:  20 tablet    Refill:  0    OA of the knees-back pain as well    Return precautions advised.  Garret Reddish, MD

## 2020-03-09 ENCOUNTER — Other Ambulatory Visit: Payer: Self-pay

## 2020-03-09 ENCOUNTER — Encounter: Payer: Self-pay | Admitting: Family Medicine

## 2020-03-09 ENCOUNTER — Ambulatory Visit (INDEPENDENT_AMBULATORY_CARE_PROVIDER_SITE_OTHER): Payer: Medicare HMO | Admitting: Family Medicine

## 2020-03-09 VITALS — BP 148/84 | HR 61 | Temp 98.0°F | Ht 61.0 in | Wt 221.4 lb

## 2020-03-09 DIAGNOSIS — E538 Deficiency of other specified B group vitamins: Secondary | ICD-10-CM | POA: Diagnosis not present

## 2020-03-09 DIAGNOSIS — E785 Hyperlipidemia, unspecified: Secondary | ICD-10-CM

## 2020-03-09 DIAGNOSIS — E1169 Type 2 diabetes mellitus with other specified complication: Secondary | ICD-10-CM

## 2020-03-09 DIAGNOSIS — H029 Unspecified disorder of eyelid: Secondary | ICD-10-CM | POA: Diagnosis not present

## 2020-03-09 DIAGNOSIS — Z1159 Encounter for screening for other viral diseases: Secondary | ICD-10-CM | POA: Diagnosis not present

## 2020-03-09 DIAGNOSIS — I152 Hypertension secondary to endocrine disorders: Secondary | ICD-10-CM

## 2020-03-09 DIAGNOSIS — F3342 Major depressive disorder, recurrent, in full remission: Secondary | ICD-10-CM

## 2020-03-09 DIAGNOSIS — F419 Anxiety disorder, unspecified: Secondary | ICD-10-CM

## 2020-03-09 DIAGNOSIS — E119 Type 2 diabetes mellitus without complications: Secondary | ICD-10-CM | POA: Diagnosis not present

## 2020-03-09 DIAGNOSIS — E039 Hypothyroidism, unspecified: Secondary | ICD-10-CM

## 2020-03-09 DIAGNOSIS — H547 Unspecified visual loss: Secondary | ICD-10-CM | POA: Diagnosis not present

## 2020-03-09 DIAGNOSIS — E1159 Type 2 diabetes mellitus with other circulatory complications: Secondary | ICD-10-CM

## 2020-03-09 DIAGNOSIS — I2510 Atherosclerotic heart disease of native coronary artery without angina pectoris: Secondary | ICD-10-CM

## 2020-03-09 DIAGNOSIS — Z8739 Personal history of other diseases of the musculoskeletal system and connective tissue: Secondary | ICD-10-CM

## 2020-03-09 LAB — COMPREHENSIVE METABOLIC PANEL
ALT: 14 U/L (ref 0–35)
AST: 21 U/L (ref 0–37)
Albumin: 4 g/dL (ref 3.5–5.2)
Alkaline Phosphatase: 110 U/L (ref 39–117)
BUN: 13 mg/dL (ref 6–23)
CO2: 32 mEq/L (ref 19–32)
Calcium: 10.4 mg/dL (ref 8.4–10.5)
Chloride: 102 mEq/L (ref 96–112)
Creatinine, Ser: 0.94 mg/dL (ref 0.40–1.20)
GFR: 57.52 mL/min — ABNORMAL LOW (ref >60.00)
Glucose, Bld: 112 mg/dL — ABNORMAL HIGH (ref 70–99)
Potassium: 4.9 mEq/L (ref 3.5–5.1)
Sodium: 138 mEq/L (ref 135–145)
Total Bilirubin: 0.5 mg/dL (ref 0.2–1.2)
Total Protein: 6.7 g/dL (ref 6.0–8.3)

## 2020-03-09 LAB — VITAMIN B12: Vitamin B-12: 714 pg/mL (ref 211–911)

## 2020-03-09 LAB — HEMOGLOBIN A1C: Hgb A1c MFr Bld: 6.5 % (ref 4.6–6.5)

## 2020-03-09 MED ORDER — DICLOFENAC SODIUM 75 MG PO TBEC: 75.0000 mg | DELAYED_RELEASE_TABLET | Freq: Every day | ORAL | 3 refills | Status: DC

## 2020-03-09 MED ORDER — HYDROCODONE-ACETAMINOPHEN 5-325 MG PO TABS: 1.0000 | ORAL_TABLET | Freq: Four times a day (QID) | ORAL | 0 refills | Status: DC | PRN

## 2020-03-27 ENCOUNTER — Other Ambulatory Visit: Payer: Self-pay | Admitting: Family Medicine

## 2020-03-30 DIAGNOSIS — H02831 Dermatochalasis of right upper eyelid: Secondary | ICD-10-CM | POA: Diagnosis not present

## 2020-03-30 DIAGNOSIS — H57813 Brow ptosis, bilateral: Secondary | ICD-10-CM | POA: Diagnosis not present

## 2020-03-30 DIAGNOSIS — H02423 Myogenic ptosis of bilateral eyelids: Secondary | ICD-10-CM | POA: Diagnosis not present

## 2020-03-30 DIAGNOSIS — H04123 Dry eye syndrome of bilateral lacrimal glands: Secondary | ICD-10-CM | POA: Diagnosis not present

## 2020-03-30 DIAGNOSIS — H02834 Dermatochalasis of left upper eyelid: Secondary | ICD-10-CM | POA: Diagnosis not present

## 2020-03-30 DIAGNOSIS — H02413 Mechanical ptosis of bilateral eyelids: Secondary | ICD-10-CM | POA: Diagnosis not present

## 2020-03-30 DIAGNOSIS — H02832 Dermatochalasis of right lower eyelid: Secondary | ICD-10-CM | POA: Diagnosis not present

## 2020-03-30 DIAGNOSIS — H02835 Dermatochalasis of left lower eyelid: Secondary | ICD-10-CM | POA: Diagnosis not present

## 2020-03-31 ENCOUNTER — Telehealth (INDEPENDENT_AMBULATORY_CARE_PROVIDER_SITE_OTHER): Payer: Medicare HMO | Admitting: Physician Assistant

## 2020-03-31 DIAGNOSIS — J069 Acute upper respiratory infection, unspecified: Secondary | ICD-10-CM

## 2020-03-31 NOTE — Patient Instructions (Signed)
Call back if worse or no improvement of symptoms.

## 2020-03-31 NOTE — Progress Notes (Signed)
Virtual Visit via Telephone Note  I connected with [name] on 03/31/20 at  4:00 PM EST by telephone and verified that I am speaking with the correct person using two identifiers.  Location: Patient: home Provider: Therapist, music at [employer]  I discussed the limitations, risks, security and privacy concerns of performing an evaluation and management service by telephone and the availability of in person appointments. I also discussed with the patient that there may be a patient responsible charge related to this service. The patient expressed understanding and agreed to proceed.   History of Present Illness: Chief complaint: Cough, rhinorrhea, headache Symptom onset: two days ago Pertinent positives: ST, sinus pressure, body aches Pertinent negatives: fever, chills Treatments tried: Tylenol  Vaccine status: COVID-19 x 4 Sick exposure: Just got back from [employer], unsure of any closer sick contacts    Observations/Objective: Speaking clearly in full sentences without pauses or gasps. No cough while talking. Does not sound overly congested.  Assessment and Plan: 1. Acute URI She had a rapid COVID-19 test done at pharmacy today and it was negative. She likely has other viral vs allergy symptoms. She is going to take OTC supportive treatment at this time such as Tylenol, Allegra, plain Mucinex. Push fluids, use nasal saline.   Follow Up Instructions:    I discussed the assessment and treatment plan with the patient. The patient was provided an opportunity to ask questions and all were answered. The patient agreed with the plan and demonstrated an understanding of the instructions.   The patient was advised to call back or seek an in-person evaluation if the symptoms worsen or if the condition fails to improve as anticipated.  I provided 8 min 30 sec of non-face-to-face time during this encounter.   Tyresse Jayson M Ronella Plunk, PA-C

## 2020-05-03 ENCOUNTER — Encounter: Payer: Self-pay | Admitting: Family Medicine

## 2020-05-03 ENCOUNTER — Other Ambulatory Visit: Payer: Self-pay

## 2020-05-03 DIAGNOSIS — M353 Polymyalgia rheumatica: Secondary | ICD-10-CM

## 2020-05-03 NOTE — Progress Notes (Signed)
Referral placed.

## 2020-05-10 ENCOUNTER — Other Ambulatory Visit: Payer: Self-pay | Admitting: Family Medicine

## 2020-05-10 MED ORDER — DESVENLAFAXINE SUCCINATE ER 100 MG PO TB24: 100.0000 mg | ORAL_TABLET | Freq: Every day | ORAL | 0 refills | Status: DC

## 2020-05-15 ENCOUNTER — Telehealth: Payer: Self-pay | Admitting: Family Medicine

## 2020-05-15 NOTE — Telephone Encounter (Signed)
Left message for patient to call back and schedule Medicare Annual Wellness Visit (AWV) either virtually OR in office.   Last AWV 04/02/19; please schedule at anytime with [facility] at [facility].  This should be a 45 minute visit.   

## 2020-05-22 ENCOUNTER — Encounter: Payer: Self-pay | Admitting: Family Medicine

## 2020-05-30 DIAGNOSIS — M353 Polymyalgia rheumatica: Secondary | ICD-10-CM | POA: Diagnosis not present

## 2020-05-30 DIAGNOSIS — Z6841 Body Mass Index (BMI) 40.0 and over, adult: Secondary | ICD-10-CM | POA: Diagnosis not present

## 2020-05-30 DIAGNOSIS — M2550 Pain in unspecified joint: Secondary | ICD-10-CM | POA: Diagnosis not present

## 2020-05-30 LAB — BASIC METABOLIC PANEL
BUN: 15 (ref 4–21)
CO2: 23 — AB (ref 13–22)
Chloride: 97 — AB (ref 99–108)
Creatinine: 1 (ref 0.5–1.1)
Glucose: 113
Potassium: 4.7 (ref 3.4–5.3)
Sodium: 135 — AB (ref 137–147)

## 2020-05-30 LAB — COMPREHENSIVE METABOLIC PANEL
Albumin: 4.6 (ref 3.5–5.0)
Calcium: 10.4 (ref 8.7–10.7)
Globulin: 2.2

## 2020-05-30 LAB — HEPATIC FUNCTION PANEL
ALT: 15 (ref 7–35)
AST: 22 (ref 13–35)
Alkaline Phosphatase: 160 — AB (ref 25–125)
Bilirubin, Total: 0.5

## 2020-05-30 LAB — CBC AND DIFFERENTIAL
HCT: 42 (ref 36–46)
Hemoglobin: 14.1 (ref 12.0–16.0)
Neutrophils Absolute: 7.8
Platelets: 227 (ref 150–399)
WBC: 10.9

## 2020-05-30 LAB — CBC: RBC: 4.78 (ref 3.87–5.11)

## 2020-06-01 ENCOUNTER — Encounter: Payer: Self-pay | Admitting: Family Medicine

## 2020-06-02 ENCOUNTER — Encounter: Payer: Self-pay | Admitting: Family Medicine

## 2020-06-06 DIAGNOSIS — M2550 Pain in unspecified joint: Secondary | ICD-10-CM | POA: Diagnosis not present

## 2020-06-06 DIAGNOSIS — M5136 Other intervertebral disc degeneration, lumbar region: Secondary | ICD-10-CM | POA: Diagnosis not present

## 2020-06-06 DIAGNOSIS — Z7952 Long term (current) use of systemic steroids: Secondary | ICD-10-CM | POA: Diagnosis not present

## 2020-06-06 DIAGNOSIS — M150 Primary generalized (osteo)arthritis: Secondary | ICD-10-CM | POA: Diagnosis not present

## 2020-06-06 DIAGNOSIS — Z6841 Body Mass Index (BMI) 40.0 and over, adult: Secondary | ICD-10-CM | POA: Diagnosis not present

## 2020-06-06 DIAGNOSIS — M353 Polymyalgia rheumatica: Secondary | ICD-10-CM | POA: Diagnosis not present

## 2020-06-29 ENCOUNTER — Telehealth: Payer: Self-pay | Admitting: Family Medicine

## 2020-06-29 NOTE — Progress Notes (Signed)
  Chronic Care Management   Outreach Note  06/29/2020 Name: [name] MRN: [id number] DOB: January 29, 1940  Referred by: Marin Olp, MD Reason for referral : No chief complaint on file.   An unsuccessful telephone outreach was attempted today. The patient was referred to the pharmacist for assistance with care management and care coordination.   Follow Up Plan:   Lauretta Grill Upstream Scheduler

## 2020-06-29 NOTE — Chronic Care Management (AMB) (Signed)
  Chronic Care Management   Note  06/29/2020 Name: [name] MRN: [id number] DOB: Aug 31, 1940  [name] is a 80 y.o. year old female who is a primary care patient of Marin Olp, MD. I reached out to [name] by phone today in response to a referral sent by Ms. Stasia Cavalier Selover's PCP, Marin Olp, MD.   Ms. Potenza was given information about Chronic Care Management services today including:  CCM service includes personalized support from designated clinical staff supervised by her physician, including individualized plan of care and coordination with other care providers 24/7 contact phone numbers for assistance for urgent and routine care needs. Service will only be billed when office clinical staff spend 20 minutes or more in a month to coordinate care. Only one practitioner may furnish and bill the service in a calendar month. The patient may stop CCM services at any time (effective at the end of the month) by phone call to the office staff.   Patient agreed to services and verbal consent obtained.   Follow up plan:   Lauretta Grill Upstream Scheduler

## 2020-07-03 ENCOUNTER — Other Ambulatory Visit: Payer: Self-pay

## 2020-07-03 ENCOUNTER — Encounter: Payer: Self-pay | Admitting: Family Medicine

## 2020-07-03 ENCOUNTER — Ambulatory Visit (INDEPENDENT_AMBULATORY_CARE_PROVIDER_SITE_OTHER): Payer: Medicare HMO | Admitting: Family Medicine

## 2020-07-03 ENCOUNTER — Telehealth: Payer: Self-pay

## 2020-07-03 VITALS — BP 131/75 | HR 64 | Temp 98.4°F | Ht 61.0 in | Wt 213.2 lb

## 2020-07-03 DIAGNOSIS — R300 Dysuria: Secondary | ICD-10-CM | POA: Diagnosis not present

## 2020-07-03 DIAGNOSIS — N3001 Acute cystitis with hematuria: Secondary | ICD-10-CM | POA: Diagnosis not present

## 2020-07-03 DIAGNOSIS — R319 Hematuria, unspecified: Secondary | ICD-10-CM | POA: Diagnosis not present

## 2020-07-03 DIAGNOSIS — R350 Frequency of micturition: Secondary | ICD-10-CM

## 2020-07-03 LAB — POCT URINALYSIS DIPSTICK
Bilirubin, UA: NEGATIVE
Glucose, UA: NEGATIVE
Ketones, UA: NEGATIVE
Nitrite, UA: NEGATIVE
Protein, UA: POSITIVE — AB
Spec Grav, UA: 1.015 (ref 1.010–1.025)
Urobilinogen, UA: 0.2 E.U./dL
pH, UA: 6 (ref 5.0–8.0)

## 2020-07-03 MED ORDER — AMOXICILLIN 875 MG PO TABS: 875.0000 mg | ORAL_TABLET | Freq: Two times a day (BID) | ORAL | 0 refills | Status: AC

## 2020-07-03 NOTE — Telephone Encounter (Signed)
Patient is scheduled this afternoon with Dr.Andy  Nurse Assessment Nurse: Zenia Resides, RN, Diane Date/Time Eilene Ghazi Time): 07/03/2020 8:41:14 AM Confirm and document reason for call. If symptomatic, describe symptoms. ---Caller states she has blood in urine, lower back pain and urination pain. Symptoms started yesterday with back pain. No fever. Able to urinate, but having frequency. Does the patient have any new or worsening symptoms? ---Yes Will a triage be completed? ---Yes Related visit to physician within the last 2 weeks? ---No Does the PT have any chronic conditions? (i.e. diabetes, asthma, this includes High risk factors for pregnancy, etc.) ---Yes List chronic conditions. ---DM, HTN, polymalgia Is this a behavioral health or substance abuse call? ---No Guidelines Guideline Title Affirmed Question Affirmed Notes Nurse Date/Time (Eastern Time) Urine - Blood In [1] Pain or burning with passing urine AND [2] side (flank) or back pain present Zenia Resides, RN, Diane 07/03/2020 8:43:26 AM PLEASE NOTE: All timestamps contained within this report are represented as [place] Standard Time. CONFIDENTIALTY NOTICE: This fax transmission is intended only for the addressee. It contains information that is legally privileged, confidential or otherwise protected from use or disclosure. If you are not the intended recipient, you are strictly prohibited from reviewing, disclosing, copying using or disseminating any of this information or taking any action in reliance on or regarding this information. If you have received this fax in error, please notify us immediately by telephone so that we can arrange for its return to [place]. Phone: [phone], Toll-Free: [phone], Fax: [phone] Page: 2 of 2 Call Id: [id number] [place]. Time Eilene Ghazi Time) Disposition Final User 07/03/2020 8:30:07 AM Attempt made - message left Zenia Resides, RN, Diane 07/03/2020 8:45:16 AM See HCP within 4 Hours (or PCP triage) Yes Zenia Resides,  RN, Diane Caller Disagree/Comply Comply Caller Understands Yes PreDisposition Call Doctor Care Advice Given Per Guideline SEE HCP (OR PCP TRIAGE) WITHIN 4 HOURS: * IF OFFICE WILL BE OPEN: You need to be seen within the next 3 or 4 hours. Call your doctor (or NP/PA) now or as soon as the office opens. PAIN MEDICINES: * ACETAMINOPHEN - REGULAR STRENGTH TYLENOL: Take 650 mg (two 325 mg pills) by mouth every 4 to 6 hours as needed. Each Regular Strength Tylenol pill has 325 mg of acetaminophen. The most you should take each day is 3,250 mg (10 pills a day). CALL BACK IF: * Fever occurs CARE ADVICE given per Urine, Blood In (Adult) guideline. Comments User: Hildred Priest, RN Date/Time Eilene Ghazi Time): 07/03/2020 8:30:26 AM Phone went straight to VM User: Hildred Priest, RN Date/Time Eilene Ghazi Time): 07/03/2020 8:47:28 AM Pt. has an appt. today at 1 pm Referrals REFERRED TO PCP OFFICE

## 2020-07-03 NOTE — Telephone Encounter (Signed)
FYI

## 2020-07-03 NOTE — Progress Notes (Signed)
Subjective   CC:  Chief Complaint  Patient presents with   Hematuria   burning with urination    Urinary Frequency    HPI: [name] is a 80 y.o. female who presents to the office today to address the problems listed above in the chief complaint. 80 year old female with history of diet-controlled diabetes, hypertension, hyperlipidemia and hypothyroidism presents with dysuria and urinary frequency.  Symptoms started yesterday.  She noticed an odor to her urine.  This morning it has a pink tinge.  She has had an occasional UTI, the last was in 2018.  She did have an Enterococcus  UTI in the past. She denies history of interstitial cystitis.  She denies vaginal symptoms including vaginal discharge or pelvic pain.  She has had no fever, chill, nausea, vomiting or flank pain.  She denies symptoms of hypoglycemia.  Assessment  1. Acute hemorrhagic cystitis   2. Hematuria, unspecified type   3. Frequent urination   4. Burning with urination      Plan  Acute hemorrhagic cystitis: Treat with amoxicillin 875 twice daily for 5 days and await culture.  This should hopefully cover an Enterococcus or E. coli UTI.  We discussed symptoms of upper tract infections.  She will notify me if she is getting any worse.  Push fluids.  Follow up: As needed Orders Placed This Encounter  Procedures   Urine Culture   POCT Urinalysis Dipstick   No orders of the defined types were placed in this encounter.     I reviewed the patients updated PMH, FH, and SocHx.    Patient Active Problem List   Diagnosis Date Noted   Primary osteoarthritis of right knee 06/28/2019   B12 deficiency 05/31/2019   Chest pain with high risk for cardiac etiology 04/02/2018   Hypercholesteremia    History of polymyalgia rheumatica 03/16/2018   CAD (coronary artery disease) 11/19/2017   Anxiety 07/17/2017   Osteopenia of left hip 07/17/2017   Overactive bladder 09/03/2016   Morbid obesity ([place]) 06/28/2015    Hypercalcemia 12/29/2014   Allergic rhinitis 07/20/2014   History of gastric bypass 02/08/2014   OA (osteoarthritis) of knee 10/05/2013   Hyperlipidemia associated with type 2 diabetes mellitus ([place]) 01/05/2008   CHEST WALL PAIN, ANTERIOR 10/09/2007   Hypothyroidism 09/09/2007   Well controlled type 2 diabetes mellitus ([place]) 07/18/2006   Major depression, recurrent, full remission ([place]) 07/18/2006   Hypertension associated with diabetes ([place]) 07/18/2006   Asthma 07/18/2006   Current Meds  Medication Sig   acetaminophen (TYLENOL) 500 MG tablet Take 1,000 mg by mouth every 8 (eight) hours as needed for mild pain or headache.   albuterol (VENTOLIN HFA) 108 (90 Base) MCG/ACT inhaler Inhale 1-2 puffs into the lungs every 4 (four) hours as needed for wheezing or shortness of breath.   amLODipine (NORVASC) 10 MG tablet Take 1 tablet (10 mg total) by mouth daily.   aspirin EC 81 MG tablet Take 81 mg by mouth daily. Swallow whole.   Cholecalciferol (VITAMIN D-3) 125 MCG (5000 UT) TABS Take 5,000 Units by mouth daily.   cyanocobalamin (,VITAMIN B-12,) 1000 MCG/ML injection Inject 1 mL (1,000 mcg total) into the muscle every 30 (thirty) days. Uses on amonthly basis (Patient taking differently: Inject 1,000 mcg into the muscle every 30 (thirty) days.)   desvenlafaxine (PRISTIQ) 100 MG 24 hr tablet Take 1 tablet (100 mg total) by mouth daily.   fluticasone (FLONASE) 50 MCG/ACT nasal spray USE 1 SPRAY IN [facility]  NOSTRIL EVERY DAY (Patient taking differently: Place 2 sprays into both nostrils daily.)   glucose blood test strip Test blood sugar once per day   HYDROcodone-acetaminophen (NORCO/VICODIN) 5-325 MG tablet Take 1 tablet by mouth every 6 (six) hours as needed for moderate pain.   loperamide (IMODIUM) 2 MG capsule Take 4 mg by mouth daily as needed for diarrhea or loose stools.    nitroGLYCERIN (NITROSTAT) 0.4 MG SL tablet Place 1 tablet (0.4 mg total) under the tongue every 5 (five) minutes as needed  for chest pain.   PREDNISONE PO Take 15 mg by mouth. Patient takes 1 tablet daily.   Propylene Glycol (SYSTANE COMPLETE OP) Place 1 drop into both eyes in the morning.   REPATHA SURECLICK 203 MG/ML SOAJ    TRUEplus Lancets 30G MISC Check blood sugar once per day    Review of Systems: Cardiovascular: negative for chest pain Respiratory: negative for SOB or persistent cough Gastrointestinal: negative for abdominal pain Constitutional: Negative for fever malaise or anorexia  Objective  Vitals: BP 131/75   Pulse 64   Temp 98.4 F (36.9 C) (Temporal)   Ht 5\' 1"  (1.549 m)   Wt 213 lb 3.2 oz (96.7 kg)   SpO2 96%   BMI 40.28 kg/m  General: no acute distress, appears well Psych:  Alert and oriented, normal mood and affect Cardiovascular:  RRR without murmur or gallop. no peripheral edema Respiratory:  Good breath sounds bilaterally, CTAB with normal respiratory effort Gastrointestinal: soft, flat abdomen, normal active bowel sounds, no palpable masses, no hepatosplenomegaly, no appreciated hernias, NO CVAT, mild suprapubic ttp w/o rebound or guarding Skin:  Warm, no rashes Neurologic:   Mental status is normal. normal gait   Office Visit on 07/03/2020  Component Date Value Ref Range Status   Color, UA 07/03/2020 brown   Final   Clarity, UA 07/03/2020 cloudy   Final   Glucose, UA 07/03/2020 Negative  Negative Final   Bilirubin, UA 07/03/2020 negative   Final   Ketones, UA 07/03/2020 negative   Final   Spec Grav, UA 07/03/2020 1.015  1.010 - 1.025 Final   Blood, UA 07/03/2020 3+   Final   pH, UA 07/03/2020 6.0  5.0 - 8.0 Final   Protein, UA 07/03/2020 Positive (A) Negative Final   Urobilinogen, UA 07/03/2020 0.2  0.2 or 1.0 E.U./dL Final   Nitrite, UA 07/03/2020 negative   Final   Leukocytes, UA 07/03/2020 Large (3+) (A) Negative Final   Commons side effects, risks, benefits, and alternatives for medications and treatment plan prescribed today were discussed, and the patient  expressed understanding of the given instructions. Patient is instructed to call or message via MyChart if he/she has any questions or concerns regarding our treatment plan. No barriers to understanding were identified. We discussed Red Flag symptoms and signs in detail. Patient expressed understanding regarding what to do in case of urgent or emergency type symptoms.  Medication list was reconciled, printed and provided to the patient in AVS. Patient instructions and summary information was reviewed with the patient as documented in the AVS. This note was prepared with assistance of Dragon voice recognition software. Occasional wrong-word or sound-a-like substitutions may have occurred due to the inherent limitations of voice recognition software

## 2020-07-03 NOTE — Patient Instructions (Signed)
Please follow up if symptoms do not improve or as needed.    Hemorrhagic Cystitis Hemorrhagic cystitis is bleeding from damage to the inner lining of the bladder. This condition results when the inner lining of the bladder (transitional epithelium) is damaged along with the blood vessels that supply the area. Hemorrhagiccystitis may make it difficult or painful to pass urine. What are the causes? This condition may be caused by: Damage from certain cancer treatments. This is the most common cause. It can result from: Radiation treatment that involves the bladder. Chemotherapy drugs used to treat certain cancers or to treat people who have a bone marrow transplant (cyclophosphamide and ifosfamide). Infections with bacteria or viruses, especially in people with a weak body defense system (immune system). Rare causes of the condition include: Other drugs, including penicillin drugs and a type of steroid (danazol). Exposure to toxic chemicals used in dyes, markers, shoe polish, or pesticides. What are the signs or symptoms? The main sign of this condition is blood in the urine (hematuria). This can range from very mild to severe. Mild hematuria can include microscopic bleeding that does not change the color of your urine. Severe hematuria can cause you to have urine with bright red blood or blood clots in it. In some cases, severe hematuria can cause large clots that fill the bladder and block urine flow (urinary obstruction). Hemorrhagic cystitis may also cause symptoms such as: An urgent or frequent need to pass urine. Pain when passing urine. Lower belly pain and fullness. Urinary obstruction. How is this diagnosed? This condition may be diagnosed based on: Your symptoms and medical history. A physical exam. Tests, such as: Urine tests to check for blood or signs of infection. Blood tests to check for signs of infection and a low red blood cell count due to bleeding. Imaging tests of the  bladder, such as ultrasound, CT scan, or MRI. A procedure to examine the inside of your bladder using a flexible scope (cystoscopy). How is this treated? Treatment depends on the cause of the condition and how severe the bleeding is. If you are being treated with chemotherapy drugs, you may be given other medicines to reduce the risk for this condition during treatment. Other treatments may include: Removing your exposure to substances that are causing the condition, such as a chemical toxin or a medicine. Taking antibiotic or antiviral medicine if the condition is caused by an infection. You may also be treated for hematuria. This can include: Fluids (hydration), bed rest, and observation. These methods may be all that is needed if you are able to pass urine and have no blood clots. Placing a flexible tube (catheter) into the bladder to continuously flush out (irrigate) the bladder with sterile saline solution. This may be needed if clots are passing or if bleeding is continuing. Medicine to reduce bleeding. A cystoscopy to remove clots if clots are filling the bladder. This may also include a procedure to stop bleeding (coagulation). A transfusion to replace blood loss. You may need surgery to stop bleeding or to remove the bladder if othertreatments have not helped. Follow these instructions at home:  If you had surgery, your health care provider will give you instructions for taking care of yourself at home after your procedure. Follow these instructions carefully. Take over-the-counter and prescription medicines only as told by your health care provider. If you were prescribed an antibiotic medicine, take it as told by your health care provider. Do not stop using the antibiotic even if  you start to feel better. Return to your normal activities as told by your health care provider. Ask your health care provider what activities are safe for you. Drink enough fluid to keep your urine pale  yellow. Keep all follow-up visits as told by your health care provider. This is important. Contact a health care provider if you have: Chills or a fever. Blood in your urine. An urgent or frequent need to pass urine. Pain when passing urine. Get help right away if you: Have bright red blood or clots in your urine. Are unable to pass urine. Summary Hemorrhagic cystitis is bleeding caused by damage to the inner lining of your bladder. Hemorrhagic cystitis may make it difficult or painful to pass urine. This condition may be caused by damage from infections, radiation therapy, or chemotherapy drugs. Blood in the urine (hematuria) is the main sign of hemorrhagic cystitis. Hematuria can be very mild and involve microscopic bleeding that does not change the color of urine. It can also be severe and include passing urine with bright red blood or blood clots in it. Treatment for hemorrhagic cystitis depends on the cause and severity of the condition. In most cases, the condition will clear up with supportive care that may include rest, fluids, and antibiotics, along with removing exposure to the cause. Other treatments may be needed in more serious cases. This information is not intended to replace advice given to you by your health care provider. Make sure you discuss any questions you have with your healthcare provider. Document Revised: 09/04/2017 Document Reviewed: 09/04/2017 Elsevier Patient Education  [place].

## 2020-07-05 ENCOUNTER — Encounter: Payer: Self-pay | Admitting: Family Medicine

## 2020-07-05 LAB — URINE CULTURE
MICRO NUMBER:: 11999794
SPECIMEN QUALITY:: ADEQUATE

## 2020-07-05 NOTE — Progress Notes (Signed)
+   UTI sensitive to abx used. No further action needed

## 2020-07-06 NOTE — Telephone Encounter (Signed)
Greatly appreciate Dr. Jonni Sanger seeing patient

## 2020-07-11 ENCOUNTER — Encounter: Payer: Self-pay | Admitting: Family Medicine

## 2020-07-11 ENCOUNTER — Ambulatory Visit (INDEPENDENT_AMBULATORY_CARE_PROVIDER_SITE_OTHER): Payer: Medicare HMO | Admitting: Family Medicine

## 2020-07-11 ENCOUNTER — Other Ambulatory Visit: Payer: Self-pay

## 2020-07-11 VITALS — BP 135/80 | HR 69 | Temp 97.9°F | Ht 61.0 in | Wt 212.0 lb

## 2020-07-11 DIAGNOSIS — I2510 Atherosclerotic heart disease of native coronary artery without angina pectoris: Secondary | ICD-10-CM | POA: Diagnosis not present

## 2020-07-11 DIAGNOSIS — M1711 Unilateral primary osteoarthritis, right knee: Secondary | ICD-10-CM

## 2020-07-11 DIAGNOSIS — E119 Type 2 diabetes mellitus without complications: Secondary | ICD-10-CM

## 2020-07-11 DIAGNOSIS — G8929 Other chronic pain: Secondary | ICD-10-CM

## 2020-07-11 DIAGNOSIS — E785 Hyperlipidemia, unspecified: Secondary | ICD-10-CM

## 2020-07-11 DIAGNOSIS — I152 Hypertension secondary to endocrine disorders: Secondary | ICD-10-CM

## 2020-07-11 DIAGNOSIS — F3342 Major depressive disorder, recurrent, in full remission: Secondary | ICD-10-CM | POA: Diagnosis not present

## 2020-07-11 DIAGNOSIS — E1159 Type 2 diabetes mellitus with other circulatory complications: Secondary | ICD-10-CM | POA: Diagnosis not present

## 2020-07-11 DIAGNOSIS — M545 Low back pain, unspecified: Secondary | ICD-10-CM

## 2020-07-11 DIAGNOSIS — E1169 Type 2 diabetes mellitus with other specified complication: Secondary | ICD-10-CM

## 2020-07-11 DIAGNOSIS — R748 Abnormal levels of other serum enzymes: Secondary | ICD-10-CM

## 2020-07-11 LAB — COMPREHENSIVE METABOLIC PANEL
ALT: 24 U/L (ref 0–35)
AST: 19 U/L (ref 0–37)
Albumin: 4.3 g/dL (ref 3.5–5.2)
Alkaline Phosphatase: 112 U/L (ref 39–117)
BUN: 15 mg/dL (ref 6–23)
CO2: 29 mEq/L (ref 19–32)
Calcium: 10.6 mg/dL — ABNORMAL HIGH (ref 8.4–10.5)
Chloride: 98 mEq/L (ref 96–112)
Creatinine, Ser: 0.92 mg/dL (ref 0.40–1.20)
GFR: 58.88 mL/min — ABNORMAL LOW (ref >60.00)
Glucose, Bld: 125 mg/dL — ABNORMAL HIGH (ref 70–99)
Potassium: 3.8 mEq/L (ref 3.5–5.1)
Sodium: 135 mEq/L (ref 135–145)
Total Bilirubin: 0.6 mg/dL (ref 0.2–1.2)
Total Protein: 7.1 g/dL (ref 6.0–8.3)

## 2020-07-11 LAB — VITAMIN D 25 HYDROXY (VIT D DEFICIENCY, FRACTURES): VITD: 44.68 ng/mL (ref 30.00–100.00)

## 2020-07-11 LAB — MICROALBUMIN / CREATININE URINE RATIO
Creatinine,U: 141.5 mg/dL
Microalb Creat Ratio: 1.5 mg/g (ref 0.0–30.0)
Microalb, Ur: 2.1 mg/dL — ABNORMAL HIGH (ref 0.0–1.9)

## 2020-07-11 LAB — GAMMA GT: GGT: 23 U/L (ref 7–51)

## 2020-07-11 LAB — HEMOGLOBIN A1C: Hgb A1c MFr Bld: 6.8 % — ABNORMAL HIGH (ref 4.6–6.5)

## 2020-07-11 MED ORDER — HYDROCODONE-ACETAMINOPHEN 5-325 MG PO TABS: 1.0000 | ORAL_TABLET | Freq: Four times a day (QID) | ORAL | 0 refills | Status: DC | PRN

## 2020-07-11 NOTE — Patient Instructions (Addendum)
Schedule PT visit at desk before you leave  Please stop by lab before you go If you have mychart- we will send your results within 3 business days of [place] receiving them.  If you do not have mychart- we will call you about results within 5 business days of [place] receiving them.  *please also note that you will see labs on mychart as soon as they post. I will later go in and write notes on them- will say "notes from Dr. Yong Channel"  Recommended follow up: Return in about 4 months (around 11/10/2020) for physical or sooner if needed.

## 2020-07-11 NOTE — Progress Notes (Signed)
Phone [phone] In person visit   Subjective:   [name] is a 80 y.o. year old very pleasant female patient who presents for/with See problem oriented charting Chief Complaint  Patient presents with   Hyperlipidemia   Hypertension   Diabetes    This visit occurred during the SARS-CoV-2 public health emergency.  Safety protocols were in place, including screening questions prior to the visit, additional usage of staff PPE, and extensive cleaning of exam room while observing appropriate contact time as indicated for disinfecting solutions.   Past Medical History-  Patient Active Problem List   Diagnosis Date Noted   History of polymyalgia rheumatica 03/16/2018    Priority: High   CAD (coronary artery disease) 11/19/2017    Priority: High   Hypercalcemia 12/29/2014    Priority: High   OA (osteoarthritis) of knee 10/05/2013    Priority: High   Well controlled type 2 diabetes mellitus ([place]) 07/18/2006    Priority: High   B12 deficiency 05/31/2019    Priority: Medium   Overactive bladder 09/03/2016    Priority: Medium   Morbid obesity ([place]) 06/28/2015    Priority: Medium   History of gastric bypass 02/08/2014    Priority: Medium   Hyperlipidemia associated with type 2 diabetes mellitus ([place]) 01/05/2008    Priority: Medium   Hypothyroidism 09/09/2007    Priority: Medium   Major depression, recurrent, full remission ([place]) 07/18/2006    Priority: Medium   Hypertension associated with diabetes ([place]) 07/18/2006    Priority: Medium   Asthma 07/18/2006    Priority: Medium   Allergic rhinitis 07/20/2014    Priority: Low   CHEST WALL PAIN, ANTERIOR 10/09/2007    Priority: Low   Primary osteoarthritis of right knee 06/28/2019   Chest pain with high risk for cardiac etiology 04/02/2018   Hypercholesteremia    Anxiety 07/17/2017   Osteopenia of left hip 07/17/2017    Medications- reviewed and updated Current Outpatient Medications  Medication Sig Dispense  Refill   acetaminophen (TYLENOL) 500 MG tablet Take 1,000 mg by mouth every 8 (eight) hours as needed for mild pain or headache.     albuterol (VENTOLIN HFA) 108 (90 Base) MCG/ACT inhaler Inhale 1-2 puffs into the lungs every 4 (four) hours as needed for wheezing or shortness of breath. 18 g 2   amLODipine (NORVASC) 10 MG tablet Take 1 tablet (10 mg total) by mouth daily. 90 tablet 2   aspirin EC 81 MG tablet Take 81 mg by mouth daily. Swallow whole.     Cholecalciferol (VITAMIN D-3) 125 MCG (5000 UT) TABS Take 5,000 Units by mouth daily.     cyanocobalamin (,VITAMIN B-12,) 1000 MCG/ML injection Inject 1 mL (1,000 mcg total) into the muscle every 30 (thirty) days. Uses on amonthly basis (Patient taking differently: Inject 1,000 mcg into the muscle every 30 (thirty) days.) 10 mL 0   desvenlafaxine (PRISTIQ) 100 MG 24 hr tablet Take 1 tablet (100 mg total) by mouth daily. 90 tablet 0   FeFum-FePo-FA-B Cmp-C-Zn-Mn-Cu (SE-TAN PLUS) 162-115.2-1 MG CAPS TAKE ONE CAPSULE BY MOUTH TWICE A DAY 180 capsule 0   fexofenadine (ALLEGRA) 180 MG tablet Take 180 mg by mouth daily as needed for allergies or rhinitis.     fluticasone (FLONASE) 50 MCG/ACT nasal spray USE 1 SPRAY IN EACH NOSTRIL EVERY DAY (Patient taking differently: Place 2 sprays into both nostrils daily.) 32 g 0   glucose blood test strip Test blood sugar once per day 100 each 3  Levothyroxine Sodium 150 MCG CAPS Take 300 mcg by mouth daily before breakfast.     loperamide (IMODIUM) 2 MG capsule Take 4 mg by mouth daily as needed for diarrhea or loose stools.      nitroGLYCERIN (NITROSTAT) 0.4 MG SL tablet Place 1 tablet (0.4 mg total) under the tongue every 5 (five) minutes as needed for chest pain. 50 tablet 3   Omega-3 Fatty Acids (FISH OIL) 1000 MG CAPS Take 1,000 mg by mouth daily.     predniSONE (DELTASONE) 5 MG tablet      Propylene Glycol (SYSTANE COMPLETE OP) Place 1 drop into both eyes in the morning.     REPATHA SURECLICK 629 MG/ML SOAJ       TRUEplus Lancets 30G MISC Check blood sugar once per day 100 each 3   diclofenac (VOLTAREN) 75 MG EC tablet Take 1 tablet (75 mg total) by mouth daily. (Patient not taking: No sig reported) 180 tablet 3   HYDROcodone-acetaminophen (NORCO/VICODIN) 5-325 MG tablet Take 1-2 tablets by mouth every 6 (six) hours as needed for moderate pain. 40 tablet 0   montelukast (SINGULAIR) 10 MG tablet TAKE 1 TABLET EVERY DAY WITH BREAKFAST (Patient not taking: No sig reported) 90 tablet 3   No current facility-administered medications for this visit.     Objective:  BP 135/80 Comment: last home reading  Pulse 69   Temp 97.9 F (36.6 C) (Temporal)   Ht 5' 1"  (1.549 m)   Wt 212 lb (96.2 kg)   SpO2 96%   BMI 40.06 kg/m  Gen: NAD, resting comfortably CV: RRR , rubs or gallops, 2/6 injection murmur (patient is asymptomatic reviewed cardiology notes from last visit and not noted-no recent echocardiogram-we opted to continue to monitor for now and this can be rechecked when sees cardiology this year) Lungs: CTAB no crackles, wheeze, rhonchi Ext: trace edema Skin: warm, dry Diabetic Foot Exam - Simple   Simple Foot Form Visual Inspection No deformities, no ulcerations, no other skin breakdown bilaterally: Yes Sensation Testing Intact to touch and monofilament testing bilaterally: Yes Pulse Check Posterior Tibialis and Dorsalis pulse intact bilaterally: Yes Comments        Assessment and Plan  #social update -patient did celebrate her 80th birthday at [place]- she had a lot of fun and her best birthday to date. Her close friend in [place] died on Good 04/01/2022- she is holding up well all things considered  #UTI-treated by Dr. In the last week with amoxicillin-patient reports she is feeling better today. Amoxicillin resolved her issues  #CAD-on aspirin 81 mg in the past-states she stopped this while on prednisone #hyperlipidemia S: Medication: Remains on Repatha every 14 days patient asymptomatic  without chest pain or shortness of breath  Lab Results  Component Value Date   CHOL 150 08/30/2019   HDL 82 08/30/2019   LDLCALC 49 08/30/2019   LDLDIRECT 60.0 05/31/2019   TRIG 96 08/30/2019   CHOLHDL 1.8 08/30/2019   A/P: CAD asymptomatic-I did encourage patient to restart her aspirin 81 mg due to underlying CAD-she wanted to stop on prednisone  For hyperlipidemia-has been well controlled-update lipid panel at next visit  # Diabetes S: Medication: None- diet/exercise controlled Lab Results  Component Value Date   HGBA1C 6.5 03/09/2020   HGBA1C 6.6 (H) 08/30/2019   HGBA1C 6.0 05/31/2019   A/P: Hopefully controlled but taking prednisone could certainly increase numbers-update A1c today-continue without medication at this time  #hypertension S: medication: Controlled with Amlodipine 10 mg Home  readings #s: average 468 systolic; 03-21 diastolic BP Readings from Last 3 Encounters:  07/11/20 135/80  07/03/20 131/75  03/09/20 (!) 148/84  A/P: Well controlled on home readings-mild poor control today in office. Continue current medications.  # Depression S: Medication: reasonably controlled on Pristiq 100 mg. Some agitation. No depressed mood or anhedonia A/P: PHQ-9 done within 10 days at 0-continue current medication is well controlled  #hypothyroidism- managed by Dr. Elyse Hsu. Defer labs to him.  #B12 deficiency-  remains on B12 injections at home. She does not do the injections every month. Well-controlled last visit-she declines repeat today-occasionally doses get pushed out Lab Results  Component Value Date   [id number] 048 03/09/2020    #Asthma-presently well controlled off Singulair (prefers to stay off if she can due to potential mood issues)-not having use albuterol   #Fibromyalgia/Bilateral knee pain/Lower back pain- takes diclofenac twice a day on bad days in the past (not taking recently due to being on prednisone and does not feel well when taking both)-we will  continue to monitor renal function - sparing hydrocodone -last refilled in February- has had to use up to 2 pills per day due to more severe pains at times-we will change prescription for #20 to #40. -Patient also requests referral to physical therapy for knee and back pain-referral placed today-she will follow-up with orthopedics if not improving   #Elevated alkaline phosphatase in the past-mildly high last visit-recheck today and check vitamin D and GGT  Hepatic Function Latest Ref Rng & Units 05/30/2020 03/09/2020 10/19/2019  Total Protein 6.0 - 8.3 g/dL - 6.7 6.3  Albumin 3.5 - 5.0 4.6 4.0 -  AST 13 - 35 22 21 18   ALT 7 - 35 15 14 20   Alk Phosphatase 25 - 125 160(A) 110 -  Total Bilirubin 0.2 - 1.2 mg/dL - 0.5 0.8  Bilirubin, Direct 0.0 - 0.3 mg/dL - - -    Recommended follow up: Return in about 4 months (around 11/10/2020) for physical or sooner if needed. Future Appointments  Date Time Provider [place]  08/14/2020 11:00 AM [facility]CM PHARMACIST [facility]    Lab/Order associations:   ICD-10-CM   1. Well controlled type 2 diabetes mellitus ([place])  E11.9 HgB A1c    Comp Met (CMET)    Microalbumin / creatinine urine ratio    2. Elevated alkaline phosphatase level  R74.8 Gamma GT    VITAMIN D 25 Hydroxy (Vit-D Deficiency, Fractures)    3. Primary osteoarthritis of right knee  M17.11 Ambulatory referral to Physical Therapy    4. Chronic bilateral low back pain without sciatica  M54.50 Ambulatory referral to Physical Therapy   G89.29       Meds ordered this encounter  Medications   HYDROcodone-acetaminophen (NORCO/VICODIN) 5-325 MG tablet    Sig: Take 1-2 tablets by mouth every 6 (six) hours as needed for moderate pain.    Dispense:  40 tablet    Refill:  0    OA of the knees-back pain as well   I,Harris Phan,acting as a scribe for Garret Reddish, MD.,have documented all relevant documentation on the behalf of Garret Reddish, MD,as directed by  Garret Reddish, MD  while in the presence of Garret Reddish, MD.   I, Garret Reddish, MD, have reviewed all documentation for this visit. The documentation on 07/11/20 for the exam, diagnosis, procedures, and orders are all accurate and complete.   Return precautions advised.  Garret Reddish, MD

## 2020-07-13 ENCOUNTER — Telehealth: Payer: Self-pay

## 2020-07-13 NOTE — Telephone Encounter (Signed)
Called and was able to get the pt on [facility] waitlist for cholesterol pt voiced understanding

## 2020-07-13 NOTE — Telephone Encounter (Signed)
Lmom the pt to call me back b/c I missed their call

## 2020-07-18 ENCOUNTER — Encounter: Payer: Self-pay | Admitting: Physical Therapy

## 2020-07-18 ENCOUNTER — Other Ambulatory Visit: Payer: Self-pay

## 2020-07-18 ENCOUNTER — Ambulatory Visit: Payer: Medicare HMO | Admitting: Physical Therapy

## 2020-07-18 DIAGNOSIS — M25562 Pain in left knee: Secondary | ICD-10-CM

## 2020-07-18 DIAGNOSIS — M25561 Pain in right knee: Secondary | ICD-10-CM

## 2020-07-18 DIAGNOSIS — G8929 Other chronic pain: Secondary | ICD-10-CM | POA: Diagnosis not present

## 2020-07-18 DIAGNOSIS — M545 Low back pain, unspecified: Secondary | ICD-10-CM

## 2020-07-18 NOTE — Patient Instructions (Signed)
Access Code: [facility] URL: [email or web address] Date: 07/18/2020 Prepared by: Lyndee Hensen  Exercises Single Knee to Chest Stretch - 2 x daily - 3 reps - 30 hold Seated Knee Extension AROM - 2 x daily - 1-2 sets - 10 reps

## 2020-07-18 NOTE — Therapy (Signed)
[facility] [address] [place], [place], [address] Phone: [phone]   Fax:  [phone]  Physical Therapy Evaluation  Patient Details  Name: [name] MRN: [id number] Date of Birth: December 29, 1940 Referring Provider (PT): Garret Reddish   Encounter Date: 07/18/2020   PT End of Session - 07/18/20 1122     Visit Number 1    Number of Visits 12    Date for PT Re-Evaluation 08/29/20    Authorization Type [employer]    PT Start Time 1017    PT Stop Time 1100    PT Time Calculation (min) 43 min    Activity Tolerance Patient tolerated treatment well    Behavior During Therapy [facility] for tasks assessed/performed             Past Medical History:  Diagnosis Date   Anemia    Arthritis    osteoarthritis - shoulder, knees & hips   Asthma    Coronary artery disease    Depression    Diabetes mellitus without complication ([place])    no meds   Hypertension    Dr. Orinda Kenner manages BP, pt. reports MD has not found a need for treatment    Hypothyroidism    Sleep difficulties    had sleep study -2009, prior to gastric surgery, told that there was not a need for f/u   Thyroid disease    Vitamin B 12 deficiency     Past Surgical History:  Procedure Laterality Date   ABDOMINAL HYSTERECTOMY     BARIATRIC [place]     [facility]- 30 yrs. ago   CATARACT EXTRACTION, BILATERAL     late 2018   LEFT HEART CATH AND CORONARY ANGIOGRAPHY N/A 04/02/2018   Procedure: LEFT HEART CATH AND CORONARY ANGIOGRAPHY;  Surgeon: Martinique, Peter M, MD;  Location: [place];  Service: Cardiovascular;  Laterality: N/A;   OOPHORECTOMY     pantallor arthrodesis with rod placement left foot     TONSILLECTOMY     TOTAL KNEE ARTHROPLASTY Left 11/30/2018   Procedure: TOTAL KNEE ARTHROPLASTY;  Surgeon: Gaynelle Arabian, MD;  [facility]: [facility];  Service: Orthopedics;  Laterality: Left;   TOTAL KNEE ARTHROPLASTY Right 06/28/2019    Procedure: TOTAL KNEE ARTHROPLASTY;  Surgeon: Gaynelle Arabian, MD;  Location: [facility];  Service: Orthopedics;  Laterality: Right;  75min   TOTAL SHOULDER ARTHROPLASTY Left 03/12/2012   Procedure: TOTAL SHOULDER ARTHROPLASTY;  Surgeon: Marin Shutter, MD;  Location: [facility];  Service: Orthopedics;  Laterality: Left;    There were no vitals filed for this visit.    Subjective Assessment - 07/18/20 1020     Subjective Pt states increased low back pain, even short distance standing and walking increases pain. Pain for a few years, but getting worse. Using cane now for balance and pain.  Has 2 TKA, L did well, R she doesnt feel is doing as well. Has increased pain in both knees R>L. Most pain with standing and walking. Is not currently doing exercis other than bike for 15 min. ALso states  Balance problem, and fusion of L foot/ankle.    Pertinent History Bil TKA, L ankle fusion. L4/5 fusion.    Patient Stated Goals decreased pain    Currently in Pain? Yes    Pain Score 8     Pain Location Back    Pain Orientation Right;Left    Pain Descriptors / Indicators Aching    Pain  Type Chronic pain    Pain Onset More than a month ago    Pain Frequency Intermittent    Aggravating Factors  standing, walking,    Pain Relieving Factors sitting, laying down    Multiple Pain Sites Yes    Pain Score 8    Pain Location Knee    Pain Orientation Right    Pain Descriptors / Indicators Aching    Pain Type Acute pain;Chronic pain    Pain Onset More than a month ago    Pain Frequency Intermittent    Aggravating Factors  standing,  walking , mid day    Pain Relieving Factors rest    Pain Score 8    Pain Location Knee    Pain Orientation Left    Pain Descriptors / Indicators Aching    Pain Type Chronic pain    Pain Onset More than a month ago    Pain Frequency Intermittent    Aggravating Factors  standing, walking    Pain Relieving Factors rest                [facility] PT Assessment - 07/18/20 0001        Assessment   Medical Diagnosis Low back pain, bil knee pain    Referring Provider (PT) Garret Reddish    Prior Therapy no      Precautions   Precautions None      Balance Screen   Has the patient fallen in the past 6 months No      Prior Function   Level of Independence Independent      Cognition   Overall Cognitive Status Within Functional Limits for tasks assessed      Posture/Postural Control   Posture Comments bil hip ER, toeing out, standing: forward flexed posture      ROM / Strength   AROM / PROM / Strength AROM;Strength      AROM   Overall AROM Comments Knees: WFL, hips: WFL, lumbar: mod limitation for flexion/ext and mild for SB      Strength   Overall Strength Comments L hip: 3+/5, R hip: 4-/5 ; Knees: 4/5;      Palpation   Palpation comment L knee: pain at medial joint line;  R knee: pain at bil joint line;  Lumbar: pain in R L2-5 musculature      Special Tests   Other special tests Neg SLR      Ambulation/Gait   Gait Comments Forward flexed posture, Hip ER, toeing out, using SPC. increased lateral trunk sway                        Objective measurements completed on examination: See above findings.       [facility] Adult PT Treatment/Exercise - 07/18/20 0001       Exercises   Exercises Knee/Hip      Knee/Hip Exercises: Stretches   Other Knee/Hip Stretches SKTC 30 sec x 2 bil;      Knee/Hip Exercises: Seated   Long Arc Quad 10 reps;Both                    PT Education - 07/18/20 1121     Education Details PT POC, exam findings, HEP    Person(s) Educated Patient    Methods Explanation;Demonstration;Tactile cues;Handout;Verbal cues    Comprehension Verbalized understanding;Returned demonstration;Verbal cues required;Tactile cues required;Need further instruction  PT Short Term Goals - 07/18/20 1124       PT SHORT TERM GOAL #1   Title be independent in initial HEP    Time 2    Period Weeks     Status New    Target Date 08/01/20               PT Long Term Goals - 07/18/20 1125       PT LONG TERM GOAL #1   Title Pt to be independent with final HEP    Time 6    Period Weeks    Status New    Target Date 09/12/20      PT LONG TERM GOAL #2   Title Pt to report decreased pain in low back to 0-4/10 with standing and walking activity    Time 8    Period Weeks    Status New    Target Date 09/12/20      PT LONG TERM GOAL #3   Title Pt to report decreased pain in bil knees to 0-3/10 with standing and walking activity    Time 8    Period Weeks    Status New    Target Date 09/12/20      PT LONG TERM GOAL #4   Title Pt to report ability for standing/ambulation for at least 15 min, without pain greater than 3/10 , to improve ability for community activities.    Time 8    Period Weeks    Status New    Target Date 09/12/20      PT LONG TERM GOAL #5   Title Pt to demo increased strength of bil knees and hips to at least 4+/5 to improve stability and pain.    Time 8    Period Weeks    Status New    Target Date 09/12/20                    Plan - 07/18/20 1128     Clinical Impression Statement Pt presents with primary complaint of increased pain in bil knees and low back. Pt with weakness in bil quads and hips. She has increased pain in bil knees, as well as in R side of low back. Pt getting much increased pain with standing and walking, which is limiting ability for functional activity. Pt with lack of effective HEP and is overall deconditioned. She will benefit from edudcation on HEP for management of knee and back pain. Pt with decreased gait mechanics and safety, with increased hip ER, toeing out, and increased lateral sway, requiring use of SPC. Pt to benefit from skilled PT to improve deficits and pain, and to improve ability and tolerance for standing activity without pain.    Personal Factors and Comorbidities Comorbidity 1    Comorbidities Lumbar fusion  L4-5, BIl TKA    Examination-Activity Limitations Lift;Stand;Locomotion Level;Bend;Squat;Stairs    Examination-Participation Restrictions Cleaning;Meal Prep;Community Activity;Shop;Laundry    Stability/Clinical Decision Making Stable/Uncomplicated    Clinical Decision Making Low    Rehab Potential Good    PT Frequency 2x / week    PT Duration 6 weeks    PT Treatment/Interventions ADLs/Self Care Home Management;Cryotherapy;Scientist, product/process development;Iontophoresis 4mg /ml Dexamethasone;Moist Heat;Ultrasound;Traction;Balance training;Therapeutic exercise;Therapeutic activities;Functional mobility training;Stair training;Gait training;Neuromuscular re-education;Patient/family education;Manual techniques;Passive range of motion;Dry needling;Taping;Vasopneumatic Device;Joint Manipulations;Spinal Manipulations    PT Home Exercise Plan [facility]    Consulted and Agree with Plan of Care Patient  Patient will benefit from skilled therapeutic intervention in order to improve the following deficits and impairments:  Abnormal gait, Pain, Improper body mechanics, Increased muscle spasms, Decreased mobility, Decreased activity tolerance, Decreased range of motion, Decreased strength, Impaired flexibility, Decreased safety awareness, Decreased balance  Visit Diagnosis: Chronic bilateral low back pain without sciatica  Chronic pain of left knee  Chronic pain of right knee     Problem List Patient Active Problem List   Diagnosis Date Noted   Primary osteoarthritis of right knee 06/28/2019   B12 deficiency 05/31/2019   Chest pain with high risk for cardiac etiology 04/02/2018   Hypercholesteremia    History of polymyalgia rheumatica 03/16/2018   CAD (coronary artery disease) 11/19/2017   Anxiety 07/17/2017   Osteopenia of left hip 07/17/2017   Overactive bladder 09/03/2016   Morbid obesity ([place]) 06/28/2015   Hypercalcemia 12/29/2014   Allergic rhinitis 07/20/2014    History of gastric bypass 02/08/2014   OA (osteoarthritis) of knee 10/05/2013   Hyperlipidemia associated with type 2 diabetes mellitus ([place]) 01/05/2008   CHEST WALL PAIN, ANTERIOR 10/09/2007   Hypothyroidism 09/09/2007   Well controlled type 2 diabetes mellitus ([place]) 07/18/2006   Major depression, recurrent, full remission ([place]) 07/18/2006   Hypertension associated with diabetes ([place]) 07/18/2006   Asthma 07/18/2006    Lyndee Hensen, PT, DPT 11:43 AM  07/18/20    [facility] [place], [place], [address] Phone: [phone]   Fax:  [phone]  Name: FLORNCE RECORD MRN: [id number] Date of Birth: 12/10/1940   Referring diagnosis? Low back pain,   R knee pain, L knee pain , muscle weakness.  Treatment diagnosis? (if different than referring diagnosis)  What was this (referring dx) caused by? []  Surgery []  Fall [x]  Ongoing issue []  Arthritis []  Other: ____________  Laterality: []  Rt []  Lt [x]  Both  Check all possible CPT codes:      [x]  97110 (Therapeutic Exercise)  []  92507 (SLP Treatment)  [x]  97112 (Neuro Re-ed)   []  92526 (Swallowing Treatment)   [x]  97116 (Gait Training)   []  [id number] (Cognitive Training, 1st 15 minutes) [x]  97140 (Manual Therapy)   []  97130 (Cognitive Training, each add'l 15 minutes)  [x]  97530 (Therapeutic Activities)  []  Other, List CPT Code ____________    [x]  36468 (Self Care)       []  All codes above (97110 - 97535)  []  97012 (Mechanical Traction)  []  97014 (E-stim Unattended)  []  97032 (E-stim manual)  [x]  97033 (Ionto)  []  97035 (Ultrasound)  []  97760 (Orthotic Fit) []  97750 (Physical Performance Training) []  [id number] (Aquatic Therapy) []  97034 (Contrast Bath) []  [id number] (Paraffin) []  97597 (Wound Care 1st 20 sq cm) []  97598 (Wound Care each add'l 20 sq cm) []  97016 (Vasopneumatic Device) []  [id number] (Orthotic Training) []  [id number] (Prosthetic Training)  ** pt did not have recent  surgery, ongoing issues.

## 2020-07-23 ENCOUNTER — Other Ambulatory Visit: Payer: Self-pay | Admitting: Family Medicine

## 2020-07-26 ENCOUNTER — Other Ambulatory Visit: Payer: Self-pay

## 2020-07-26 ENCOUNTER — Ambulatory Visit: Payer: Medicare HMO | Admitting: Physical Therapy

## 2020-07-26 DIAGNOSIS — M25561 Pain in right knee: Secondary | ICD-10-CM | POA: Diagnosis not present

## 2020-07-26 DIAGNOSIS — M25562 Pain in left knee: Secondary | ICD-10-CM | POA: Diagnosis not present

## 2020-07-26 DIAGNOSIS — G8929 Other chronic pain: Secondary | ICD-10-CM | POA: Diagnosis not present

## 2020-07-26 DIAGNOSIS — M545 Low back pain, unspecified: Secondary | ICD-10-CM | POA: Diagnosis not present

## 2020-07-27 ENCOUNTER — Encounter: Payer: Self-pay | Admitting: Physical Therapy

## 2020-07-27 NOTE — Therapy (Signed)
[facility] [address] [place], [place], [address] Phone: [phone]   Fax:  [phone]  Physical Therapy Treatment  Patient Details  Name: [name] MRN: [id number] Date of Birth: [birth year] Referring Provider (PT): Garret Reddish   Encounter Date: 07/26/2020   PT End of Session - 07/27/20 0819     Visit Number 2    Number of Visits 12    Date for PT Re-Evaluation 08/29/20    Authorization Type [employer]    PT Start Time 9528    PT Stop Time 4132    PT Time Calculation (min) 42 min    Activity Tolerance Patient tolerated treatment well    Behavior During Therapy [facility] for tasks assessed/performed             Past Medical History:  Diagnosis Date   Anemia    Arthritis    osteoarthritis - shoulder, knees & hips   Asthma    Coronary artery disease    Depression    Diabetes mellitus without complication ([place])    no meds   Hypertension    Dr. Orinda Kenner manages BP, pt. reports MD has not found a need for treatment    Hypothyroidism    Sleep difficulties    had sleep study -2009, prior to gastric surgery, told that there was not a need for f/u   Thyroid disease    Vitamin B 12 deficiency     Past Surgical History:  Procedure Laterality Date   ABDOMINAL HYSTERECTOMY     BARIATRIC [place]     [facility]- 30 yrs. ago   CATARACT EXTRACTION, BILATERAL     late 2018   LEFT HEART CATH AND CORONARY ANGIOGRAPHY N/A 04/02/2018   Procedure: LEFT HEART CATH AND CORONARY ANGIOGRAPHY;  Surgeon: Martinique, Peter M, MD;  Location: [facility];  Service: Cardiovascular;  Laterality: N/A;   OOPHORECTOMY     pantallor arthrodesis with rod placement left foot     TONSILLECTOMY     TOTAL KNEE ARTHROPLASTY Left 11/30/2018   Procedure: TOTAL KNEE ARTHROPLASTY;  Surgeon: Gaynelle Arabian, MD;  [facility];  Service: Orthopedics;  Laterality: Left;   TOTAL KNEE ARTHROPLASTY Right 06/28/2019   Procedure:  TOTAL KNEE ARTHROPLASTY;  Surgeon: Gaynelle Arabian, MD;  [facility];  Service: Orthopedics;  Laterality: Right;  32min   TOTAL SHOULDER ARTHROPLASTY Left 03/12/2012   Procedure: TOTAL SHOULDER ARTHROPLASTY;  Surgeon: Marin Shutter, MD;  Location: [facility];  Service: Orthopedics;  Laterality: Left;    There were no vitals filed for this visit.   Subjective Assessment - 07/27/20 0818     Subjective Pt states continued back pain. L knee also more sore today.    Currently in Pain? Yes    Pain Score 7     Pain Location Back    Pain Orientation Right;Left    Pain Descriptors / Indicators Aching    Pain Type Chronic pain    Pain Onset More than a month ago    Pain Frequency Intermittent    Pain Score 8    Pain Location Knee    Pain Orientation Right    Pain Descriptors / Indicators Aching    Pain Type Acute pain;Chronic pain    Pain Onset More than a month ago    Pain Frequency Intermittent    Pain Score 7    Pain Orientation Left    Pain Descriptors / Indicators  Aching    Pain Type Chronic pain    Pain Onset More than a month ago    Pain Frequency Intermittent                               [facility] Adult PT Treatment/Exercise - 07/27/20 0001       Ambulation/Gait   Gait Comments Forward flexed posture, Hip ER, toeing out, using SPC. increased lateral trunk sway      Posture/Postural Control   Posture Comments bil hip ER, toeing out, standing: forward flexed posture      Exercises   Exercises Knee/Hip      Knee/Hip Exercises: Stretches   Other Knee/Hip Stretches SKTC 30 sec x 2 bil; LTR x 15; Pelvic tilts x 15;      Knee/Hip Exercises: Standing   Hip Flexion 20 reps;Knee bent    Other Standing Knee Exercises L/R and staggered stance weight shifts x 20 each;  Side stepping at counter 10 ft x 6;      Knee/Hip Exercises: Seated   Long Arc Quad Both;20 reps    Long Arc Quad Weight 3 lbs.    Sit to [employer] 10 reps      Knee/Hip Exercises: Supine    Bridges 15 reps    Straight Leg Raises 10 reps;Both    Straight Leg Raises Limitations difficult      Manual Therapy   Manual Therapy Joint mobilization;Soft tissue mobilization    Soft tissue mobilization STM/DTM to Bil, R>L  lumbar paraspinals                      PT Short Term Goals - 07/18/20 1124       PT SHORT TERM GOAL #1   Title be independent in initial HEP    Time 2    Period Weeks    Status New    Target Date 08/01/20               PT Long Term Goals - 07/18/20 1125       PT LONG TERM GOAL #1   Title Pt to be independent with final HEP    Time 6    Period Weeks    Status New    Target Date 09/12/20      PT LONG TERM GOAL #2   Title Pt to report decreased pain in low back to 0-4/10 with standing and walking activity    Time 8    Period Weeks    Status New    Target Date 09/12/20      PT LONG TERM GOAL #3   Title Pt to report decreased pain in bil knees to 0-3/10 with standing and walking activity    Time 8    Period Weeks    Status New    Target Date 09/12/20      PT LONG TERM GOAL #4   Title Pt to report ability for standing/ambulation for at least 15 min, without pain greater than 3/10 , to improve ability for community activities.    Time 8    Period Weeks    Status New    Target Date 09/12/20      PT LONG TERM GOAL #5   Title Pt to demo increased strength of bil knees and hips to at least 4+/5 to improve stability and pain.    Time 8    Period Weeks  Status New    Target Date 09/12/20                   Plan - 07/27/20 0820     Clinical Impression Statement Pt did well with progression of ther ex for strengthening today. Noted tightness and soreness in R low back with manual today. Pt not using cane today, recommneded that pt continue to use when going out of house. Plan for more gait training with SPC next session. Pt with decreased stability and footing without cane today.    Personal Factors and Comorbidities  Comorbidity 1    Comorbidities Lumbar fusion L4-5, BIl TKA    Examination-Activity Limitations Lift;Stand;Locomotion Level;Bend;Squat;Stairs    Examination-Participation Restrictions Cleaning;Meal Prep;Community Activity;Shop;Laundry    Stability/Clinical Decision Making Stable/Uncomplicated    Rehab Potential Good    PT Frequency 2x / week    PT Duration 6 weeks    PT Treatment/Interventions ADLs/Self Care Home Management;Cryotherapy;Scientist, product/process development;Iontophoresis 4mg /ml Dexamethasone;Moist Heat;Ultrasound;Traction;Balance training;Therapeutic exercise;Therapeutic activities;Functional mobility training;Stair training;Gait training;Neuromuscular re-education;Patient/family education;Manual techniques;Passive range of motion;Dry needling;Taping;Vasopneumatic Device;Joint Manipulations;Spinal Manipulations    PT Home Exercise Plan [facility]    Consulted and Agree with Plan of Care Patient             Patient will benefit from skilled therapeutic intervention in order to improve the following deficits and impairments:  Abnormal gait, Pain, Improper body mechanics, Increased muscle spasms, Decreased mobility, Decreased activity tolerance, Decreased range of motion, Decreased strength, Impaired flexibility, Decreased safety awareness, Decreased balance  Visit Diagnosis: Chronic bilateral low back pain without sciatica  Chronic pain of left knee  Chronic pain of right knee     Problem List Patient Active Problem List   Diagnosis Date Noted   Primary osteoarthritis of right knee 06/28/2019   B12 deficiency 05/31/2019   Chest pain with high risk for cardiac etiology 04/02/2018   Hypercholesteremia    History of polymyalgia rheumatica 03/16/2018   CAD (coronary artery disease) 11/19/2017   Anxiety 07/17/2017   Osteopenia of left hip 07/17/2017   Overactive bladder 09/03/2016   Morbid obesity ([place]) 06/28/2015   Hypercalcemia 12/29/2014   Allergic rhinitis  07/20/2014   History of gastric bypass 02/08/2014   OA (osteoarthritis) of knee 10/05/2013   Hyperlipidemia associated with type 2 diabetes mellitus ([place]) 01/05/2008   CHEST WALL PAIN, ANTERIOR 10/09/2007   Hypothyroidism 09/09/2007   Well controlled type 2 diabetes mellitus ([place]) 07/18/2006   Major depression, recurrent, full remission ([place]) 07/18/2006   Hypertension associated with diabetes ([place]) 07/18/2006   Asthma 07/18/2006   Lyndee Hensen, PT, DPT 8:26 AM  07/27/20    [facility] [place], [place], [address] Phone: [phone]   Fax:  [phone]  Name: IARA MONDS MRN: [id number] Date of Birth: 1940/05/22

## 2020-07-31 ENCOUNTER — Other Ambulatory Visit: Payer: Self-pay

## 2020-07-31 ENCOUNTER — Ambulatory Visit: Payer: Medicare HMO | Admitting: Physical Therapy

## 2020-07-31 ENCOUNTER — Encounter: Payer: Self-pay | Admitting: Physical Therapy

## 2020-07-31 DIAGNOSIS — G8929 Other chronic pain: Secondary | ICD-10-CM

## 2020-07-31 DIAGNOSIS — M545 Low back pain, unspecified: Secondary | ICD-10-CM

## 2020-07-31 DIAGNOSIS — M25561 Pain in right knee: Secondary | ICD-10-CM

## 2020-07-31 DIAGNOSIS — M25562 Pain in left knee: Secondary | ICD-10-CM

## 2020-07-31 NOTE — Therapy (Signed)
[facility] [address] [place], [place], [address] Phone: [phone]   Fax:  [phone]  Physical Therapy Treatment  Patient Details  Name: [name] MRN: [id number] Date of Birth: 1940/08/08 Referring Provider (PT): Garret Reddish   Encounter Date: 07/31/2020   PT End of Session - 07/31/20 1217     Visit Number 3    Number of Visits 12    Date for PT Re-Evaluation 08/29/20    Authorization Type [employer]    PT Start Time 1022    PT Stop Time 1102    PT Time Calculation (min) 40 min    Activity Tolerance Patient tolerated treatment well    Behavior During Therapy [facility] for tasks assessed/performed             Past Medical History:  Diagnosis Date   Anemia    Arthritis    osteoarthritis - shoulder, knees & hips   Asthma    Coronary artery disease    Depression    Diabetes mellitus without complication ([place])    no meds   Hypertension    Dr. Orinda Kenner manages BP, pt. reports MD has not found a need for treatment    Hypothyroidism    Sleep difficulties    had sleep study -2009, prior to gastric surgery, told that there was not a need for f/u   Thyroid disease    Vitamin B 12 deficiency     Past Surgical History:  Procedure Laterality Date   ABDOMINAL HYSTERECTOMY     BARIATRIC [place]     [facility]- 30 yrs. ago   CATARACT EXTRACTION, BILATERAL     late 2018   LEFT HEART CATH AND CORONARY ANGIOGRAPHY N/A 04/02/2018   Procedure: LEFT HEART CATH AND CORONARY ANGIOGRAPHY;  Surgeon: Martinique, Peter M, MD;  Location: [place] [facility];  Service: Cardiovascular;  Laterality: N/A;   OOPHORECTOMY     pantallor arthrodesis with rod placement left foot     TONSILLECTOMY     TOTAL KNEE ARTHROPLASTY Left 11/30/2018   Procedure: TOTAL KNEE ARTHROPLASTY;  Surgeon: Gaynelle Arabian, MD;  [facility];  Service: Orthopedics;  Laterality: Left;   TOTAL KNEE ARTHROPLASTY Right 06/28/2019   Procedure:  TOTAL KNEE ARTHROPLASTY;  Surgeon: Gaynelle Arabian, MD;  [facility]: [facility];  Service: Orthopedics;  Laterality: Right;  71min   TOTAL SHOULDER ARTHROPLASTY Left 03/12/2012   Procedure: TOTAL SHOULDER ARTHROPLASTY;  Surgeon: Marin Shutter, MD;  Location: [facility];  Service: Orthopedics;  Laterality: Left;    There were no vitals filed for this visit.   Subjective Assessment - 07/31/20 1216     Subjective Pt states decreased pain in back since last visit. She still notes some increased pain with standing and walking. She has been doing HEP.    Currently in Pain? Yes    Pain Score 0-No pain    Pain Location Back    Pain Score 5    Pain Location Knee    Pain Orientation Right;Left    Pain Descriptors / Indicators Aching                               [facility] Adult PT Treatment/Exercise - 07/31/20 0001       Ambulation/Gait   Gait Comments 35 ft x 8 with use of SPC, education on sequencing and positioning of cane.  Posture/Postural Control   Posture Comments bil hip ER, toeing out, standing: forward flexed posture      Exercises   Exercises Knee/Hip      Knee/Hip Exercises: Stretches   Other Knee/Hip Stretches SKTC 30 sec x 2 bil;; Pelvic tilts x 15;      Knee/Hip Exercises: Standing   Hip Flexion --    Other Standing Knee Exercises Standing march, hip abd 2x 10 each;      Knee/Hip Exercises: Seated   Long Arc Quad Both;20 reps    Long Arc Quad Weight 3 lbs.    Sit to [employer] 10 reps      Knee/Hip Exercises: Supine   Bridges 10 reps    Bridges Limitations education on form.    Straight Leg Raises 10 reps;Both    Straight Leg Raises Limitations with TA      Manual Therapy   Manual Therapy Joint mobilization;Soft tissue mobilization    Soft tissue mobilization --                      PT Short Term Goals - 07/18/20 1124       PT SHORT TERM GOAL #1   Title be independent in initial HEP    Time 2    Period Weeks    Status New    Target  Date 08/01/20               PT Long Term Goals - 07/18/20 1125       PT LONG TERM GOAL #1   Title Pt to be independent with final HEP    Time 6    Period Weeks    Status New    Target Date 09/12/20      PT LONG TERM GOAL #2   Title Pt to report decreased pain in low back to 0-4/10 with standing and walking activity    Time 8    Period Weeks    Status New    Target Date 09/12/20      PT LONG TERM GOAL #3   Title Pt to report decreased pain in bil knees to 0-3/10 with standing and walking activity    Time 8    Period Weeks    Status New    Target Date 09/12/20      PT LONG TERM GOAL #4   Title Pt to report ability for standing/ambulation for at least 15 min, without pain greater than 3/10 , to improve ability for community activities.    Time 8    Period Weeks    Status New    Target Date 09/12/20      PT LONG TERM GOAL #5   Title Pt to demo increased strength of bil knees and hips to at least 4+/5 to improve stability and pain.    Time 8    Period Weeks    Status New    Target Date 09/12/20                   Plan - 07/31/20 1219     Clinical Impression Statement Pt with good ability for strengthening today, does have mild soreness in bilateral knees with standing activity. Ambulation with SPC today, with improved stability compared to without using it last session.  Plan to progress strengthening as tolerated.    Personal Factors and Comorbidities Comorbidity 1    Comorbidities Lumbar fusion L4-5, BIl TKA    Examination-Activity Limitations Lift;Stand;Locomotion Level;Bend;Squat;Stairs  Examination-Participation Restrictions Cleaning;Meal Prep;Community Activity;Shop;Laundry    Stability/Clinical Decision Making Stable/Uncomplicated    Rehab Potential Good    PT Frequency 2x / week    PT Duration 6 weeks    PT Treatment/Interventions ADLs/Self Care Home Management;Cryotherapy;Scientist, product/process development;Iontophoresis 4mg /ml  Dexamethasone;Moist Heat;Ultrasound;Traction;Balance training;Therapeutic exercise;Therapeutic activities;Functional mobility training;Stair training;Gait training;Neuromuscular re-education;Patient/family education;Manual techniques;Passive range of motion;Dry needling;Taping;Vasopneumatic Device;Joint Manipulations;Spinal Manipulations    PT Home Exercise Plan [facility]    Consulted and Agree with Plan of Care Patient             Patient will benefit from skilled therapeutic intervention in order to improve the following deficits and impairments:  Abnormal gait, Pain, Improper body mechanics, Increased muscle spasms, Decreased mobility, Decreased activity tolerance, Decreased range of motion, Decreased strength, Impaired flexibility, Decreased safety awareness, Decreased balance  Visit Diagnosis: Chronic bilateral low back pain without sciatica  Chronic pain of left knee  Chronic pain of right knee     Problem List Patient Active Problem List   Diagnosis Date Noted   Primary osteoarthritis of right knee 06/28/2019   B12 deficiency 05/31/2019   Chest pain with high risk for cardiac etiology 04/02/2018   Hypercholesteremia    History of polymyalgia rheumatica 03/16/2018   CAD (coronary artery disease) 11/19/2017   Anxiety 07/17/2017   Osteopenia of left hip 07/17/2017   Overactive bladder 09/03/2016   Morbid obesity ([place]) 06/28/2015   Hypercalcemia 12/29/2014   Allergic rhinitis 07/20/2014   History of gastric bypass 02/08/2014   OA (osteoarthritis) of knee 10/05/2013   Hyperlipidemia associated with type 2 diabetes mellitus ([place]) 01/05/2008   CHEST WALL PAIN, ANTERIOR 10/09/2007   Hypothyroidism 09/09/2007   Well controlled type 2 diabetes mellitus ([place]) 07/18/2006   Major depression, recurrent, full remission ([place]) 07/18/2006   Hypertension associated with diabetes ([place]) 07/18/2006   Asthma 07/18/2006   Lyndee Hensen, PT, DPT 12:29 PM  07/31/20    [facility] [address] [place], [place], [address] Phone: [phone]   Fax:  [phone]  Name: [name] MRN: [id number] Date of Birth: 30-Aug-1940

## 2020-08-02 ENCOUNTER — Ambulatory Visit: Payer: Medicare HMO | Admitting: Physical Therapy

## 2020-08-02 DIAGNOSIS — G8929 Other chronic pain: Secondary | ICD-10-CM

## 2020-08-02 DIAGNOSIS — M545 Low back pain, unspecified: Secondary | ICD-10-CM

## 2020-08-02 DIAGNOSIS — M25562 Pain in left knee: Secondary | ICD-10-CM | POA: Diagnosis not present

## 2020-08-02 DIAGNOSIS — M25561 Pain in right knee: Secondary | ICD-10-CM

## 2020-08-06 ENCOUNTER — Encounter: Payer: Self-pay | Admitting: Physical Therapy

## 2020-08-06 NOTE — Therapy (Signed)
[facility] [address] [place], [place], [address] Phone: [phone]   Fax:  [phone]  Physical Therapy Treatment  Patient Details  Name: TASHEIKA KITZMILLER MRN: [id number] Date of Birth: [birth year] Referring Provider (PT): Garret Reddish   Encounter Date: 08/02/2020   PT End of Session - 08/06/20 2130     Visit Number 4    Number of Visits 12    Date for PT Re-Evaluation 08/29/20    Authorization Type [employer]    PT Start Time 1347    PT Stop Time 1429    PT Time Calculation (min) 42 min    Activity Tolerance Patient tolerated treatment well    Behavior During Therapy [facility] for tasks assessed/performed             Past Medical History:  Diagnosis Date   Anemia    Arthritis    osteoarthritis - shoulder, knees & hips   Asthma    Coronary artery disease    Depression    Diabetes mellitus without complication ([place])    no meds   Hypertension    Dr. Orinda Kenner manages BP, pt. reports MD has not found a need for treatment    Hypothyroidism    Sleep difficulties    had sleep study -2009, prior to gastric surgery, told that there was not a need for f/u   Thyroid disease    Vitamin B 12 deficiency     Past Surgical History:  Procedure Laterality Date   ABDOMINAL HYSTERECTOMY     BARIATRIC [place]     [facility]- 30 yrs. ago   CATARACT EXTRACTION, BILATERAL     late 2018   LEFT HEART CATH AND CORONARY ANGIOGRAPHY N/A 04/02/2018   Procedure: LEFT HEART CATH AND CORONARY ANGIOGRAPHY;  Surgeon: Martinique, Peter M, MD;  Location: [place] [facility];  Service: Cardiovascular;  Laterality: N/A;   OOPHORECTOMY     pantallor arthrodesis with rod placement left foot     TONSILLECTOMY     TOTAL KNEE ARTHROPLASTY Left 11/30/2018   Procedure: TOTAL KNEE ARTHROPLASTY;  Surgeon: Gaynelle Arabian, MD;  [facility];  Service: Orthopedics;  Laterality: Left;   TOTAL KNEE ARTHROPLASTY Right 06/28/2019   Procedure:  TOTAL KNEE ARTHROPLASTY;  Surgeon: Gaynelle Arabian, MD;  [facility];  Service: Orthopedics;  Laterality: Right;  71min   TOTAL SHOULDER ARTHROPLASTY Left 03/12/2012   Procedure: TOTAL SHOULDER ARTHROPLASTY;  Surgeon: Marin Shutter, MD;  Location: [facility];  Service: Orthopedics;  Laterality: Left;    There were no vitals filed for this visit.   Subjective Assessment - 08/06/20 2128     Subjective Pt sore today.    Currently in Pain? Yes    Pain Score 3     Pain Location Back    Pain Orientation Right    Pain Descriptors / Indicators Aching    Pain Type Chronic pain    Pain Onset More than a month ago    Pain Frequency Intermittent    Pain Score 5    Pain Location Knee    Pain Orientation Left;Right    Pain Descriptors / Indicators Aching    Pain Type Acute pain;Chronic pain    Pain Onset More than a month ago    Pain Frequency Intermittent  [facility] Adult PT Treatment/Exercise - 08/06/20 0001       Posture/Postural Control   Posture Comments bil hip ER, toeing out, standing: forward flexed posture      Exercises   Exercises Knee/Hip      Knee/Hip Exercises: Stretches   Other Knee/Hip Stretches SKTC 30 sec x 2 bil;;      Knee/Hip Exercises: Aerobic   Recumbent Bike L2 x 5 min      Knee/Hip Exercises: Standing   Hip Flexion 20 reps;Knee bent    Forward Step Up 5 reps;Both;Step Height: 6";Hand Hold: 1    Other Standing Knee Exercises Standing march, hip abd 2x 10 each;      Knee/Hip Exercises: Supine   Bridges 20 reps    Straight Leg Raises 10 reps;Both    Straight Leg Raises Limitations with TA      Knee/Hip Exercises: Sidelying   Hip ABduction 10 reps;Both      Manual Therapy   Manual Therapy Joint mobilization;Soft tissue mobilization                      PT Short Term Goals - 07/18/20 1124       PT SHORT TERM GOAL #1   Title be independent in initial HEP    Time 2    Period Weeks    Status  New    Target Date 08/01/20               PT Long Term Goals - 07/18/20 1125       PT LONG TERM GOAL #1   Title Pt to be independent with final HEP    Time 6    Period Weeks    Status New    Target Date 09/12/20      PT LONG TERM GOAL #2   Title Pt to report decreased pain in low back to 0-4/10 with standing and walking activity    Time 8    Period Weeks    Status New    Target Date 09/12/20      PT LONG TERM GOAL #3   Title Pt to report decreased pain in bil knees to 0-3/10 with standing and walking activity    Time 8    Period Weeks    Status New    Target Date 09/12/20      PT LONG TERM GOAL #4   Title Pt to report ability for standing/ambulation for at least 15 min, without pain greater than 3/10 , to improve ability for community activities.    Time 8    Period Weeks    Status New    Target Date 09/12/20      PT LONG TERM GOAL #5   Title Pt to demo increased strength of bil knees and hips to at least 4+/5 to improve stability and pain.    Time 8    Period Weeks    Status New    Target Date 09/12/20                   Plan - 08/06/20 2134     Clinical Impression Statement Pt with good ability for ther ex today, challenged with most activities due to weakness.  Pt will benefit from continued strengthening for hips and knees.    Personal Factors and Comorbidities Comorbidity 1    Comorbidities Lumbar fusion L4-5, BIl TKA    Examination-Activity Limitations Lift;Stand;Locomotion Level;Bend;Squat;Stairs    Examination-Participation Restrictions Cleaning;Meal Prep;Community  Activity;Shop;Laundry    Stability/Clinical Decision Making Stable/Uncomplicated    Rehab Potential Good    PT Frequency 2x / week    PT Duration 6 weeks    PT Treatment/Interventions ADLs/Self Care Home Management;Cryotherapy;Scientist, product/process development;Iontophoresis 4mg /ml Dexamethasone;Moist Heat;Ultrasound;Traction;Balance training;Therapeutic exercise;Therapeutic  activities;Functional mobility training;Stair training;Gait training;Neuromuscular re-education;Patient/family education;Manual techniques;Passive range of motion;Dry needling;Taping;Vasopneumatic Device;Joint Manipulations;Spinal Manipulations    PT Home Exercise Plan [facility]    Consulted and Agree with Plan of Care Patient             Patient will benefit from skilled therapeutic intervention in order to improve the following deficits and impairments:  Abnormal gait, Pain, Improper body mechanics, Increased muscle spasms, Decreased mobility, Decreased activity tolerance, Decreased range of motion, Decreased strength, Impaired flexibility, Decreased safety awareness, Decreased balance  Visit Diagnosis: Chronic bilateral low back pain without sciatica  Chronic pain of left knee  Chronic pain of right knee     Problem List Patient Active Problem List   Diagnosis Date Noted   Primary osteoarthritis of right knee 06/28/2019   B12 deficiency 05/31/2019   Chest pain with high risk for cardiac etiology 04/02/2018   Hypercholesteremia    History of polymyalgia rheumatica 03/16/2018   CAD (coronary artery disease) 11/19/2017   Anxiety 07/17/2017   Osteopenia of left hip 07/17/2017   Overactive bladder 09/03/2016   Morbid obesity ([place]) 06/28/2015   Hypercalcemia 12/29/2014   Allergic rhinitis 07/20/2014   History of gastric bypass 02/08/2014   OA (osteoarthritis) of knee 10/05/2013   Hyperlipidemia associated with type 2 diabetes mellitus ([place]) 01/05/2008   CHEST WALL PAIN, ANTERIOR 10/09/2007   Hypothyroidism 09/09/2007   Well controlled type 2 diabetes mellitus ([place]) 07/18/2006   Major depression, recurrent, full remission ([place]) 07/18/2006   Hypertension associated with diabetes ([place]) 07/18/2006   Asthma 07/18/2006   Lyndee Hensen, PT, DPT 9:38 PM  08/06/20   [facility] [address] [place], [place], [address] Phone:  [phone]   Fax:  [phone]  Name: ARTIA SINGLEY MRN: [id number] Date of Birth: 12-21-1940

## 2020-08-07 ENCOUNTER — Encounter: Payer: Self-pay | Admitting: Physical Therapy

## 2020-08-07 ENCOUNTER — Ambulatory Visit: Payer: Medicare HMO | Admitting: Physical Therapy

## 2020-08-07 DIAGNOSIS — G8929 Other chronic pain: Secondary | ICD-10-CM

## 2020-08-07 DIAGNOSIS — M25561 Pain in right knee: Secondary | ICD-10-CM | POA: Diagnosis not present

## 2020-08-07 DIAGNOSIS — M25562 Pain in left knee: Secondary | ICD-10-CM | POA: Diagnosis not present

## 2020-08-07 DIAGNOSIS — M545 Low back pain, unspecified: Secondary | ICD-10-CM

## 2020-08-08 ENCOUNTER — Telehealth: Payer: Self-pay | Admitting: Family Medicine

## 2020-08-08 ENCOUNTER — Telehealth: Payer: Self-pay

## 2020-08-08 NOTE — Telephone Encounter (Signed)
[facility] called regarding a prescription that was called in. Pt called [facility] that Dr Yong Channel called in a prescription. Pharmacist stated that they need verbal consent

## 2020-08-08 NOTE — Therapy (Signed)
[facility] [address] [place], [place], [address] Phone: [phone]   Fax:  [phone]  Physical Therapy Treatment  Patient Details  Name: [name] MRN: [id number] Date of Birth: Dec 12, 1940 Referring Provider (PT): Garret Reddish   Encounter Date: 08/07/2020   PT End of Session - 08/07/20 1031     Visit Number 5    Number of Visits 12    Date for PT Re-Evaluation 08/29/20    Authorization Type [employer]    PT Start Time 1020    PT Stop Time 1100    PT Time Calculation (min) 40 min    Activity Tolerance Patient tolerated treatment well    Behavior During Therapy [facility] for tasks assessed/performed             Past Medical History:  Diagnosis Date   Anemia    Arthritis    osteoarthritis - shoulder, knees & hips   Asthma    Coronary artery disease    Depression    Diabetes mellitus without complication ([place])    no meds   Hypertension    Dr. Orinda Kenner manages BP, pt. reports MD has not found a need for treatment    Hypothyroidism    Sleep difficulties    had sleep study -2009, prior to gastric surgery, told that there was not a need for f/u   Thyroid disease    Vitamin B 12 deficiency     Past Surgical History:  Procedure Laterality Date   ABDOMINAL HYSTERECTOMY     BARIATRIC [place]     [facility]- 30 yrs. ago   CATARACT EXTRACTION, BILATERAL     late 2018   LEFT HEART CATH AND CORONARY ANGIOGRAPHY N/A 04/02/2018   Procedure: LEFT HEART CATH AND CORONARY ANGIOGRAPHY;  Surgeon: Martinique, Peter M, MD;  Location: [place] [facility];  Service: Cardiovascular;  Laterality: N/A;   OOPHORECTOMY     pantallor arthrodesis with rod placement left foot     TONSILLECTOMY     TOTAL KNEE ARTHROPLASTY Left 11/30/2018   Procedure: TOTAL KNEE ARTHROPLASTY;  Surgeon: Gaynelle Arabian, MD;  [facility];  Service: Orthopedics;  Laterality: Left;   TOTAL KNEE ARTHROPLASTY Right 06/28/2019   Procedure:  TOTAL KNEE ARTHROPLASTY;  Surgeon: Gaynelle Arabian, MD;  [facility];  Service: Orthopedics;  Laterality: Right;  78min   TOTAL SHOULDER ARTHROPLASTY Left 03/12/2012   Procedure: TOTAL SHOULDER ARTHROPLASTY;  Surgeon: Marin Shutter, MD;  Location: [facility];  Service: Orthopedics;  Laterality: Left;    There were no vitals filed for this visit.   Subjective Assessment - 08/08/20 1332     Subjective Pt states overall soreness since last visit. She states muscle soreness and soreness in knees. Has started taking tylenol.    Currently in Pain? Yes    Pain Score 3     Pain Location Back    Pain Orientation Right    Pain Descriptors / Indicators Aching    Pain Type Chronic pain    Pain Onset More than a month ago    Pain Frequency Intermittent    Pain Score 4    Pain Location Knee    Pain Orientation Right;Left    Pain Descriptors / Indicators Aching    Pain Type Acute pain;Chronic pain    Pain Onset More than a month ago    Pain Frequency Intermittent  [facility] Adult PT Treatment/Exercise - 08/08/20 0001       Knee/Hip Exercises: Stretches   Active Hamstring Stretch 3 reps;30 seconds    Active Hamstring Stretch Limitations seated    Other Knee/Hip Stretches LTR x 10 , 5s      Knee/Hip Exercises: Aerobic   Recumbent Bike L2 x 2 min      Knee/Hip Exercises: Standing   Hip Flexion 20 reps;Knee bent    Other Standing Knee Exercises Standing march, hip abd 2x 10 each;      Knee/Hip Exercises: Seated   Long Arc Quad Both;20 reps    Long Arc Quad Weight 3 lbs.    Sit to [employer] 10 reps      Knee/Hip Exercises: Supine   Other Supine Knee/Hip Exercises Supine march x 20; with TA      Manual Therapy   Soft tissue mobilization long leg distaction bil for lumbar pump x 2 min ea                      PT Short Term Goals - 07/18/20 1124       PT SHORT TERM GOAL #1   Title be independent in initial HEP    Time 2     Period Weeks    Status New    Target Date 08/01/20               PT Long Term Goals - 07/18/20 1125       PT LONG TERM GOAL #1   Title Pt to be independent with final HEP    Time 6    Period Weeks    Status New    Target Date 09/12/20      PT LONG TERM GOAL #2   Title Pt to report decreased pain in low back to 0-4/10 with standing and walking activity    Time 8    Period Weeks    Status New    Target Date 09/12/20      PT LONG TERM GOAL #3   Title Pt to report decreased pain in bil knees to 0-3/10 with standing and walking activity    Time 8    Period Weeks    Status New    Target Date 09/12/20      PT LONG TERM GOAL #4   Title Pt to report ability for standing/ambulation for at least 15 min, without pain greater than 3/10 , to improve ability for community activities.    Time 8    Period Weeks    Status New    Target Date 09/12/20      PT LONG TERM GOAL #5   Title Pt to demo increased strength of bil knees and hips to at least 4+/5 to improve stability and pain.    Time 8    Period Weeks    Status New    Target Date 09/12/20                   Plan - 08/08/20 1333     Clinical Impression Statement Pt with overall increased pain in  last few days, somewhat better today. Discussed continuing light activities as able, even if having more pain. Pt able to do light activities today without increased pain or diffiuclty. Will continue to monitor for increased pain at next visit. Previous session not likely cause of increased pain, due to it being light activities that pt has performed previously.  Personal Factors and Comorbidities Comorbidity 1    Comorbidities Lumbar fusion L4-5, BIl TKA    Examination-Activity Limitations Lift;Stand;Locomotion Level;Bend;Squat;Stairs    Examination-Participation Restrictions Cleaning;Meal Prep;Community Activity;Shop;Laundry    Stability/Clinical Decision Making Stable/Uncomplicated    Rehab Potential Good    PT  Frequency 2x / week    PT Duration 6 weeks    PT Treatment/Interventions ADLs/Self Care Home Management;Cryotherapy;Scientist, product/process development;Iontophoresis 4mg /ml Dexamethasone;Moist Heat;Ultrasound;Traction;Balance training;Therapeutic exercise;Therapeutic activities;Functional mobility training;Stair training;Gait training;Neuromuscular re-education;Patient/family education;Manual techniques;Passive range of motion;Dry needling;Taping;Vasopneumatic Device;Joint Manipulations;Spinal Manipulations    PT Home Exercise Plan [facility]    Consulted and Agree with Plan of Care Patient             Patient will benefit from skilled therapeutic intervention in order to improve the following deficits and impairments:  Abnormal gait, Pain, Improper body mechanics, Increased muscle spasms, Decreased mobility, Decreased activity tolerance, Decreased range of motion, Decreased strength, Impaired flexibility, Decreased safety awareness, Decreased balance  Visit Diagnosis: Chronic bilateral low back pain without sciatica  Chronic pain of left knee  Chronic pain of right knee     Problem List Patient Active Problem List   Diagnosis Date Noted   Primary osteoarthritis of right knee 06/28/2019   B12 deficiency 05/31/2019   Chest pain with high risk for cardiac etiology 04/02/2018   Hypercholesteremia    History of polymyalgia rheumatica 03/16/2018   CAD (coronary artery disease) 11/19/2017   Anxiety 07/17/2017   Osteopenia of left hip 07/17/2017   Overactive bladder 09/03/2016   Morbid obesity ([place]) 06/28/2015   Hypercalcemia 12/29/2014   Allergic rhinitis 07/20/2014   History of gastric bypass 02/08/2014   OA (osteoarthritis) of knee 10/05/2013   Hyperlipidemia associated with type 2 diabetes mellitus ([place]) 01/05/2008   CHEST WALL PAIN, ANTERIOR 10/09/2007   Hypothyroidism 09/09/2007   Well controlled type 2 diabetes mellitus ([place]) 07/18/2006   Major depression, recurrent,  full remission ([place]) 07/18/2006   Hypertension associated with diabetes ([place]) 07/18/2006   Asthma 07/18/2006    Lyndee Hensen, PT, DPT 1:36 PM  08/08/20    [facility] [address] [place], [place], [address] Phone: [phone]   Fax:  [phone]  Name: [name] MRN: [id number] Date of Birth: 04/27/1940

## 2020-08-09 ENCOUNTER — Encounter: Payer: Medicare HMO | Admitting: Physical Therapy

## 2020-08-09 ENCOUNTER — Telehealth: Payer: Self-pay | Admitting: Pharmacist

## 2020-08-09 ENCOUNTER — Other Ambulatory Visit: Payer: Self-pay

## 2020-08-09 ENCOUNTER — Other Ambulatory Visit: Payer: Self-pay | Admitting: Family Medicine

## 2020-08-09 MED ORDER — DESVENLAFAXINE SUCCINATE ER 100 MG PO TB24: 100.0000 mg | ORAL_TABLET | Freq: Every day | ORAL | 2 refills | Status: DC

## 2020-08-09 NOTE — Telephone Encounter (Signed)
Patient would like to know why this medication has been denied.    Patient can be reached at [phone].

## 2020-08-09 NOTE — Chronic Care Management (AMB) (Signed)
Chronic Care Management Pharmacy Assistant   Name: CHARLENA HAUB  MRN: [id number] DOB: Jul 25, 1940  [name] is an 80 y.o. year old female who presents for her initial CCM visit with the clinical pharmacist.  Reason for Encounter: Initial CCM Visit with Clinical Pharmacist   Conditions to be addressed/monitored: HTN, CAD, Asthma, DM, HLD, Hypothyroidism, OA of knee, Osteopenia of left hip, Depression, Anxiety, B12 deficiency  Primary concerns for visit include:   Recent office visits:  07/11/2020 OV PCP Marin Olp, MD; chronic f/u, stable, no medication changes indicated.  07/03/2020 Renaee Munda, MD; Amoxicillin 875 twice daily for 5 days for acute hemorrhagic cystitis.  03/09/2020 OV PCP Marin Olp, MD; chronic f/u, stable, no medication changes indicated.  Recent consult visits:    Hospital visits:    Medications: Outpatient Encounter Medications as of 08/09/2020  Medication Sig Note   acetaminophen (TYLENOL) 500 MG tablet Take 1,000 mg by mouth every 8 (eight) hours as needed for mild pain or headache.    albuterol (VENTOLIN HFA) 108 (90 Base) MCG/ACT inhaler Inhale 1-2 puffs into the lungs every 4 (four) hours as needed for wheezing or shortness of breath.    amLODipine (NORVASC) 10 MG tablet Take 1 tablet (10 mg total) by mouth daily.    aspirin EC 81 MG tablet Take 81 mg by mouth daily. Swallow whole.    Cholecalciferol (VITAMIN D-3) 125 MCG (5000 UT) TABS Take 5,000 Units by mouth daily.    cyanocobalamin (,VITAMIN B-12,) 1000 MCG/ML injection Inject 1 mL (1,000 mcg total) into the muscle every 30 (thirty) days. Uses on amonthly basis (Patient taking differently: Inject 1,000 mcg into the muscle every 30 (thirty) days.)    desvenlafaxine (PRISTIQ) 100 MG 24 hr tablet Take 1 tablet (100 mg total) by mouth daily.    diclofenac (VOLTAREN) 75 MG EC tablet Take 1 tablet (75 mg total) by mouth daily. (Patient not taking: No sig reported)     FeFum-FePo-FA-B Cmp-C-Zn-Mn-Cu (SE-TAN PLUS) 162-115.2-1 MG CAPS TAKE ONE CAPSULE BY MOUTH TWICE A DAY    fexofenadine (ALLEGRA) 180 MG tablet Take 180 mg by mouth daily as needed for allergies or rhinitis.    fluticasone (FLONASE) 50 MCG/ACT nasal spray USE 1 SPRAY IN EACH NOSTRIL EVERY DAY (Patient taking differently: Place 2 sprays into both nostrils daily.)    glucose blood test strip Test blood sugar once per day    HYDROcodone-acetaminophen (NORCO/VICODIN) 5-325 MG tablet Take 1-2 tablets by mouth every 6 (six) hours as needed for moderate pain.    Levothyroxine Sodium 150 MCG CAPS Take 300 mcg by mouth daily before breakfast. 08/29/2019: Patient prefers to take these one hour prior to her breakfast   loperamide (IMODIUM) 2 MG capsule Take 4 mg by mouth daily as needed for diarrhea or loose stools.     montelukast (SINGULAIR) 10 MG tablet TAKE 1 TABLET EVERY DAY WITH BREAKFAST (Patient not taking: No sig reported)    nitroGLYCERIN (NITROSTAT) 0.4 MG SL tablet Place 1 tablet (0.4 mg total) under the tongue every 5 (five) minutes as needed for chest pain.    Omega-3 Fatty Acids (FISH OIL) 1000 MG CAPS Take 1,000 mg by mouth daily.    predniSONE (DELTASONE) 5 MG tablet     Propylene Glycol (SYSTANE COMPLETE OP) Place 1 drop into both eyes in the morning.    REPATHA SURECLICK 710 MG/ML SOAJ     TRUEplus Lancets 30G MISC Check blood sugar once per  day    No facility-administered encounter medications on file as of 08/09/2020.    Current medications: Pristiq 100 mg 1 tablet daily, last filled 05/11/2020 90 DS Norco 5-325 mg 1-2 tablets every 6 hrs prn, last filled 03/09/2020 Prednisone 5 mg last filled 07/07/2020 30 DS Repatha Sureclick 791 mg/mL, last filled 04/17/2020 84 DS SE-TAN Plus 116--115.2-1 mg one capsule twice daily, last filled 03/10/19 Diclofenac 75 mg last filled 05/10/2020 90 DS Amlodipine 10 mg Fexofenadine 180 mg Nitroglycerin 0.4 mg Aspirin 81 mg Albuterol (Ventolin HFA0  108 mcg/act inhaler last filled 08/18/2019 Montelukast 10 mg Cyanocobalamin 1000 mcg/mL injection Cyanocobalamin 125 mcg once a day Omega-3 Fatty Acids 1000 mg daily Levothyroxine 150 mcg two capsules before breakfast Tylenol 500 mg Propylene Glycol Loperamide 2 mg   Any changes in your medications or health?  Any side effects from any medications?   Do you have any symptoms or problems not managed by your medications?  Any concerns about your health right now?  Has your provider asked that you check blood pressure, blood sugar, or follow special diet at home?  Do you get any type of exercise on a regular basis?  Can you think of a goal you would like to reach for your health?  Do you have any problems getting your medications?  Is there anything that you would like to discuss during the appointment?   Please bring medications and supplements to appointment  Future Appointments  Date Time Provider [place]  08/14/2020 10:15 AM Lyndee Hensen, PT [facility]  08/14/2020 11:00 AM [facility] CCM PHARMACIST [facility]  08/28/2020 10:15 AM Lyndee Hensen, PT [facility]  09/11/2020 10:15 AM Lyndee Hensen, PT [facility]  09/14/2020 10:15 AM Lyndee Hensen, PT [facility]  10/26/2020  1:45 PM Leanor Kail, PA [facility]  11/16/2020  9:40 AM Yong Channel Brayton Mars, MD [facility]    Star Rating Drugs: None  *Unsuccessful attempt at reaching patient to finish this call. Chart Review completed.  April D Calhoun, [place] Pharmacist Assistant [phone]

## 2020-08-09 NOTE — Telephone Encounter (Signed)
Called and spoke with pt and made pt aware that I have spoken with pharmacy and refill has been verbally given over the phone to [place].

## 2020-08-09 NOTE — Telephone Encounter (Signed)
Called and spoke with [facility] and they never received the rx we sent on 07/26/20 even though it stated receipt confirmed by pharmacy on 07/26/20 at 7:43 am. Refill verbally given over the phone today with pharmacist.

## 2020-08-10 ENCOUNTER — Other Ambulatory Visit: Payer: Self-pay

## 2020-08-10 ENCOUNTER — Encounter: Payer: Self-pay | Admitting: Physical Therapy

## 2020-08-10 ENCOUNTER — Ambulatory Visit: Payer: Medicare HMO | Admitting: Physical Therapy

## 2020-08-10 DIAGNOSIS — M25562 Pain in left knee: Secondary | ICD-10-CM

## 2020-08-10 DIAGNOSIS — G8929 Other chronic pain: Secondary | ICD-10-CM

## 2020-08-10 DIAGNOSIS — M25561 Pain in right knee: Secondary | ICD-10-CM

## 2020-08-10 DIAGNOSIS — M545 Low back pain, unspecified: Secondary | ICD-10-CM | POA: Diagnosis not present

## 2020-08-10 NOTE — Patient Instructions (Signed)
Access Code: [facility] URL: [email or web address] Date: 08/10/2020 Prepared by: Lyndee Hensen  Exercises Single Knee to Chest Stretch - 2 x daily - 3 reps - 30 hold Supine Posterior Pelvic Tilt - 2 x daily - 2 sets - 10 reps Supine Lower Trunk Rotation - 2 x daily - 10 reps - 5 hold Supine Bridge - 1 x daily - 1 sets - 10 reps Straight Leg Raise - 1 x daily - 2 sets - 10 reps Sidelying Hip Abduction - 1 x daily - 1-2 sets - 10 reps Clamshell - 1 x daily - 1-2 sets - 10 reps Seated Knee Extension AROM - 2 x daily - 1-2 sets - 10 reps Sit to Stand - 1 x daily - 1 sets - 10 reps Standing March with Counter Support - 1 x daily - 2 sets - 10 reps

## 2020-08-10 NOTE — Therapy (Addendum)
[facility] [address] [place], [place], [address] Phone: [phone]   Fax:  [phone]  Physical Therapy Treatment  Patient Details  Name: [name] MRN: [id number] Date of Birth: 03-22-1940 Referring Provider (PT): Garret Reddish   Encounter Date: 08/10/2020   PT End of Session - 08/10/20 1029     Visit Number 6    Number of Visits 12    Date for PT Re-Evaluation 08/29/20    Authorization Type [employer]    8/23    PT Start Time 1020    PT Stop Time 1100    PT Time Calculation (min) 40 min    Activity Tolerance Patient tolerated treatment well    Behavior During Therapy [facility] for tasks assessed/performed             Past Medical History:  Diagnosis Date   Anemia    Arthritis    osteoarthritis - shoulder, knees & hips   Asthma    Coronary artery disease    Depression    Diabetes mellitus without complication ([place])    no meds   Hypertension    Dr. Orinda Kenner manages BP, pt. reports MD has not found a need for treatment    Hypothyroidism    Sleep difficulties    had sleep study -2009, prior to gastric surgery, told that there was not a need for f/u   Thyroid disease    Vitamin B 12 deficiency     Past Surgical History:  Procedure Laterality Date   ABDOMINAL HYSTERECTOMY     BARIATRIC [place]     [facility]- 30 yrs. ago   CATARACT EXTRACTION, BILATERAL     late 2018   LEFT HEART CATH AND CORONARY ANGIOGRAPHY N/A 04/02/2018   Procedure: LEFT HEART CATH AND CORONARY ANGIOGRAPHY;  Surgeon: Martinique, Peter M, MD;  Location: [facility];  Service: Cardiovascular;  Laterality: N/A;   OOPHORECTOMY     pantallor arthrodesis with rod placement left foot     TONSILLECTOMY     TOTAL KNEE ARTHROPLASTY Left 11/30/2018   Procedure: TOTAL KNEE ARTHROPLASTY;  Surgeon: Gaynelle Arabian, MD;  Location: [facility];  Service: Orthopedics;  Laterality: Left;   TOTAL KNEE ARTHROPLASTY Right 06/28/2019    Procedure: TOTAL KNEE ARTHROPLASTY;  Surgeon: Gaynelle Arabian, MD;  Location: [facility];  Service: Orthopedics;  Laterality: Right;  68mn   TOTAL SHOULDER ARTHROPLASTY Left 03/12/2012   Procedure: TOTAL SHOULDER ARTHROPLASTY;  Surgeon: KMarin Shutter MD;  [facility]  Service: Orthopedics;  Laterality: Left;    There were no vitals filed for this visit.   Subjective Assessment - 08/10/20 1028     Subjective Pt states feeling better today than earlier this week. Pain back down to low levels in back and knees.    Currently in Pain? Yes    Pain Score 2     Pain Location Back    Pain Orientation Right    Pain Descriptors / Indicators Aching    Pain Type Chronic pain    Pain Onset More than a month ago    Pain Frequency Intermittent    Pain Score 0    Pain Location Knee    Pain Orientation Left;Right                               [facility] Adult PT Treatment/Exercise - 08/10/20 0001  Knee/Hip Exercises: Stretches   Active Hamstring Stretch 3 reps;30 seconds    Active Hamstring Stretch Limitations seated    Other Knee/Hip Stretches LTR x 10 , 5s    Other Knee/Hip Stretches SKTC 30 sec x 1 bil;      Knee/Hip Exercises: Aerobic   Recumbent Bike L2 x 5  min      Knee/Hip Exercises: Standing   Hip Flexion 20 reps;Knee bent    Other Standing Knee Exercises Standing march, hip abd 2x 10 each;      Knee/Hip Exercises: Seated   Long Arc Quad Both;20 reps    Long Arc Quad Weight 3 lbs.    Sit to [employer] 10 reps      Knee/Hip Exercises: Supine   Bridges 20 reps    Other Supine Knee/Hip Exercises --      Knee/Hip Exercises: Sidelying   Hip ABduction 10 reps;Both    Clams x10 bil;      Manual Therapy   Soft tissue mobilization --                    PT Education - 08/10/20 1055     Education Details HEP updated    Person(s) Educated Patient    Methods Explanation;Demonstration;Verbal cues;Handout    Comprehension Verbalized  understanding;Returned demonstration;Verbal cues required;Need further instruction              PT Short Term Goals - 07/18/20 1124       PT SHORT TERM GOAL #1   Title be independent in initial HEP    Time 2    Period Weeks    Status New    Target Date 08/01/20               PT Long Term Goals - 07/18/20 1125       PT LONG TERM GOAL #1   Title Pt to be independent with final HEP    Time 6    Period Weeks    Status New    Target Date 09/12/20      PT LONG TERM GOAL #2   Title Pt to report decreased pain in low back to 0-4/10 with standing and walking activity    Time 8    Period Weeks    Status New    Target Date 09/12/20      PT LONG TERM GOAL #3   Title Pt to report decreased pain in bil knees to 0-3/10 with standing and walking activity    Time 8    Period Weeks    Status New    Target Date 09/12/20      PT LONG TERM GOAL #4   Title Pt to report ability for standing/ambulation for at least 15 min, without pain greater than 3/10 , to improve ability for community activities.    Time 8    Period Weeks    Status New    Target Date 09/12/20      PT LONG TERM GOAL #5   Title Pt to demo increased strength of bil knees and hips to at least 4+/5 to improve stability and pain.    Time 8    Period Weeks    Status New    Target Date 09/12/20                   Plan - 08/10/20 1100     Clinical Impression Statement Pt with much less pain overall today. Able to perform  ther ex without pain. Updated HEP, discussed importance of HEP for strengthening. Pt will decrease to 1x/wk. Pt most challenged with hip exercises, due to weakness. Pt to benefit from continued care, for strengthening, and improving ability for functional activity and community activies without pain or deficit.    Personal Factors and Comorbidities Comorbidity 1    Comorbidities Lumbar fusion L4-5, BIl TKA    Examination-Activity Limitations Lift;Stand;Locomotion  Level;Bend;Squat;Stairs    Examination-Participation Restrictions Cleaning;Meal Prep;Community Activity;Shop;Laundry    Stability/Clinical Decision Making Stable/Uncomplicated    Rehab Potential Good    PT Frequency 2x / week    PT Duration 6 weeks    PT Treatment/Interventions ADLs/Self Care Home Management;Cryotherapy;Scientist, product/process development;Iontophoresis 64m/ml Dexamethasone;Moist Heat;Ultrasound;Traction;Balance training;Therapeutic exercise;Therapeutic activities;Functional mobility training;Stair training;Gait training;Neuromuscular re-education;Patient/family education;Manual techniques;Passive range of motion;Dry needling;Taping;Vasopneumatic Device;Joint Manipulations;Spinal Manipulations    PT Home [facility]   Consulted and Agree with Plan of Care Patient             Patient will benefit from skilled therapeutic intervention in order to improve the following deficits and impairments:  Abnormal gait, Pain, Improper body mechanics, Increased muscle spasms, Decreased mobility, Decreased activity tolerance, Decreased range of motion, Decreased strength, Impaired flexibility, Decreased safety awareness, Decreased balance  Visit Diagnosis: Chronic bilateral low back pain without sciatica  Chronic pain of left knee  Chronic pain of right knee     Problem List Patient Active Problem List   Diagnosis Date Noted   Primary osteoarthritis of right knee 06/28/2019   B12 deficiency 05/31/2019   Chest pain with high risk for cardiac etiology 04/02/2018   Hypercholesteremia    History of polymyalgia rheumatica 03/16/2018   CAD (coronary artery disease) 11/19/2017   Anxiety 07/17/2017   Osteopenia of left hip 07/17/2017   Overactive bladder 09/03/2016   Morbid obesity ([place] 06/28/2015   Hypercalcemia 12/29/2014   Allergic rhinitis 07/20/2014   History of gastric bypass 02/08/2014   OA (osteoarthritis) of knee 10/05/2013   Hyperlipidemia associated  with type 2 diabetes mellitus ([place] 01/05/2008   CHEST WALL PAIN, ANTERIOR 10/09/2007   Hypothyroidism 09/09/2007   Well controlled type 2 diabetes mellitus ([place] 07/18/2006   Major depression, recurrent, full remission ([place] 07/18/2006   Hypertension associated with diabetes ([place] 07/18/2006   Asthma 07/18/2006   LLyndee Hensen PT, DPT 11:04 AM  08/10/20     CN[facility] CenterHTropic[address] Shore StreetRLondonderry N[place] [address]Phone: [phone]  Fax:  [phone] Name: Traci SLOMSKIMRN: [id number] of Birth: [birth year] PHYSICAL THERAPY DISCHARGE SUMMARY  Visits from Start of Care: 6 Plan: Patient agrees to discharge.  Patient goals were partially met. Patient is being discharged due to - not returning since last visit.    LLyndee Hensen PT, DPT 3:48 PM  02/15/21

## 2020-08-14 ENCOUNTER — Encounter: Payer: Medicare HMO | Admitting: Physical Therapy

## 2020-08-14 ENCOUNTER — Telehealth: Payer: Medicare HMO

## 2020-08-15 DIAGNOSIS — Z7952 Long term (current) use of systemic steroids: Secondary | ICD-10-CM | POA: Diagnosis not present

## 2020-08-15 DIAGNOSIS — M150 Primary generalized (osteo)arthritis: Secondary | ICD-10-CM | POA: Diagnosis not present

## 2020-08-15 DIAGNOSIS — Z6841 Body Mass Index (BMI) 40.0 and over, adult: Secondary | ICD-10-CM | POA: Diagnosis not present

## 2020-08-15 DIAGNOSIS — M5136 Other intervertebral disc degeneration, lumbar region: Secondary | ICD-10-CM | POA: Diagnosis not present

## 2020-08-15 DIAGNOSIS — M2550 Pain in unspecified joint: Secondary | ICD-10-CM | POA: Diagnosis not present

## 2020-08-15 DIAGNOSIS — M353 Polymyalgia rheumatica: Secondary | ICD-10-CM | POA: Diagnosis not present

## 2020-08-16 ENCOUNTER — Encounter: Payer: Medicare HMO | Admitting: Physical Therapy

## 2020-08-21 ENCOUNTER — Other Ambulatory Visit: Payer: Self-pay | Admitting: Family Medicine

## 2020-08-21 MED ORDER — FLUTICASONE PROPIONATE 50 MCG/ACT NA SUSP: 2.0000 | Freq: Every day | NASAL | 0 refills | Status: DC

## 2020-08-28 ENCOUNTER — Encounter: Payer: Medicare HMO | Admitting: Physical Therapy

## 2020-09-09 ENCOUNTER — Other Ambulatory Visit: Payer: Self-pay

## 2020-09-09 ENCOUNTER — Emergency Department
Admission: EM | Admit: 2020-09-09 | Discharge: 2020-09-09 | Disposition: A | Discharge status: Home / Self Care | Payer: Medicare HMO | Source: Home / Self Care | Attending: Family Medicine | Admitting: Family Medicine

## 2020-09-09 ENCOUNTER — Encounter: Payer: Self-pay | Admitting: Emergency Medicine

## 2020-09-09 DIAGNOSIS — N3000 Acute cystitis without hematuria: Secondary | ICD-10-CM

## 2020-09-09 LAB — POCT URINALYSIS DIP (MANUAL ENTRY)
Glucose, UA: NEGATIVE mg/dL
Ketones, POC UA: NEGATIVE mg/dL
Nitrite, UA: NEGATIVE
Protein Ur, POC: >=300 mg/dL — AB
Spec Grav, UA: 1.025 (ref 1.010–1.025)
Urobilinogen, UA: 0.2 E.U./dL
pH, UA: 5.5 (ref 5.0–8.0)

## 2020-09-09 MED ORDER — PHENAZOPYRIDINE HCL 200 MG PO TABS: 200.0000 mg | ORAL_TABLET | Freq: Three times a day (TID) | ORAL | 0 refills | Status: DC

## 2020-09-09 MED ORDER — CEPHALEXIN 500 MG PO CAPS: 500.0000 mg | ORAL_CAPSULE | Freq: Two times a day (BID) | ORAL | 0 refills | Status: DC

## 2020-09-09 NOTE — Discharge Instructions (Addendum)
Drink lots of water Take the antibiotic 2 times a day for 5 days.  Take 2 doses today Take Pyridium if needed for urinary discomfort.  This will turn your urine orange and stain clothing.  It is recommended you wear a pad while on this medicine Follow-up with your primary care doctor The urine culture has been sent to the laboratory.  You will be called if a change in antibiotic is necessary

## 2020-09-09 NOTE — ED Triage Notes (Signed)
Patient c/o dysuria, urinary urgency, frequency, some hematuria x 1 day.  Patient did take some AZO yesterday, none today.

## 2020-09-10 NOTE — ED Provider Notes (Signed)
[facility]    CSN: [id number] Arrival date & time: 09/09/20  [id number]      History   Chief Complaint Chief Complaint  Patient presents with   Dysuria    HPI [name] is a 80 y.o. female.   HPI  Patient is here for dysuria and frequency.  She has had UTIs before and believes have another bladder infection.  No abdominal pain.  No flank pain.  No fever or chills.  Past Medical History:  Diagnosis Date   Anemia    Arthritis    osteoarthritis - shoulder, knees & hips   Asthma    Coronary artery disease    Depression    Diabetes mellitus without complication ([place])    no meds   Hypertension    Dr. Orinda Kenner manages BP, pt. reports MD has not found a need for treatment    Hypothyroidism    Sleep difficulties    had sleep study -2009, prior to gastric surgery, told that there was not a need for f/u   Thyroid disease    Vitamin B 12 deficiency     Patient Active Problem List   Diagnosis Date Noted   Primary osteoarthritis of right knee 06/28/2019   B12 deficiency 05/31/2019   Chest pain with high risk for cardiac etiology 04/02/2018   Hypercholesteremia    History of polymyalgia rheumatica 03/16/2018   CAD (coronary artery disease) 11/19/2017   Anxiety 07/17/2017   Osteopenia of left hip 07/17/2017   Overactive bladder 09/03/2016   Morbid obesity ([place]) 06/28/2015   Hypercalcemia 12/29/2014   Allergic rhinitis 07/20/2014   History of gastric bypass 02/08/2014   OA (osteoarthritis) of knee 10/05/2013   Hyperlipidemia associated with type 2 diabetes mellitus ([place]) 01/05/2008   CHEST WALL PAIN, ANTERIOR 10/09/2007   Hypothyroidism 09/09/2007   Well controlled type 2 diabetes mellitus ([place]) 07/18/2006   Major depression, recurrent, full remission ([place]) 07/18/2006   Hypertension associated with diabetes ([place]) 07/18/2006   Asthma 07/18/2006    Past Surgical History:  Procedure Laterality Date   ABDOMINAL HYSTERECTOMY     BARIATRIC [place]     [facility]- 30 yrs. ago   CATARACT EXTRACTION, BILATERAL     late 2018   LEFT HEART CATH AND CORONARY ANGIOGRAPHY N/A 04/02/2018   Procedure: LEFT HEART CATH AND CORONARY ANGIOGRAPHY;  Surgeon: Martinique, Peter M, MD;  Location: [place] [facility];  Service: Cardiovascular;  Laterality: N/A;   OOPHORECTOMY     pantallor arthrodesis with rod placement left foot     TONSILLECTOMY     TOTAL KNEE ARTHROPLASTY Left 11/30/2018   Procedure: TOTAL KNEE ARTHROPLASTY;  Surgeon: Gaynelle Arabian, MD;  [facility];  Service: Orthopedics;  Laterality: Left;   TOTAL KNEE ARTHROPLASTY Right 06/28/2019   Procedure: TOTAL KNEE ARTHROPLASTY;  Surgeon: Gaynelle Arabian, MD;  [facility]: [facility];  Service: Orthopedics;  Laterality: Right;  75mn   TOTAL SHOULDER ARTHROPLASTY Left 03/12/2012   Procedure: TOTAL SHOULDER ARTHROPLASTY;  Surgeon: KMarin Shutter MD;  [place]  Service: Orthopedics;  Laterality: Left;    OB History   No obstetric history on file.      Home Medications    Prior to Admission medications   Medication Sig Start Date End Date Taking? Authorizing Provider  acetaminophen (TYLENOL) 500 MG tablet Take 1,000 mg by mouth every 8 (eight) hours as needed for mild pain or headache.   Yes [provider]  albuterol (VENTOLIN HFA) 108 (90 Base) MCG/ACT inhaler Inhale 1-2 puffs into the lungs every 4 (four) hours as needed for wheezing or shortness of breath. 08/18/19  Yes Marin Olp, MD  amLODipine (NORVASC) 10 MG tablet Take 1 tablet (10 mg total) by mouth daily. 02/25/20  Yes Marin Olp, MD  aspirin EC 81 MG tablet Take 81 mg by mouth daily. Swallow whole.   Yes [provider]  cephALEXin (KEFLEX) 500 MG capsule Take 1 capsule (500 mg total) by mouth 2 (two) times daily. 09/09/20  Yes Raylene Everts, MD  Cholecalciferol (VITAMIN D-3) 125 MCG (5000 UT) TABS Take 5,000 Units by mouth daily.   Yes [provider]  cyanocobalamin  (,VITAMIN B-12,) 1000 MCG/ML injection Inject 1 mL (1,000 mcg total) into the muscle every 30 (thirty) days. Uses on amonthly basis Patient taking differently: Inject 1,000 mcg into the muscle every 30 (thirty) days. 11/18/18  Yes Marin Olp, MD  desvenlafaxine (PRISTIQ) 100 MG 24 hr tablet Take 1 tablet (100 mg total) by mouth daily. 08/09/20  Yes Marin Olp, MD  diclofenac (VOLTAREN) 75 MG EC tablet Take 1 tablet (75 mg total) by mouth daily. 03/09/20  Yes Marin Olp, MD  FeFum-FePo-FA-B Cmp-C-Zn-Mn-Cu (SE-TAN PLUS) [phone] MG CAPS TAKE ONE CAPSULE BY MOUTH TWICE A DAY 03/27/20  Yes Marin Olp, MD  fexofenadine (ALLEGRA) 180 MG tablet Take 180 mg by mouth daily as needed for allergies or rhinitis.   Yes [provider]  fluticasone (FLONASE) 50 MCG/ACT nasal spray Place 2 sprays into both nostrils daily. 08/21/20  Yes Marin Olp, MD  glucose blood test strip Test blood sugar once per day 12/14/19  Yes Marin Olp, MD  Levothyroxine Sodium 150 MCG CAPS Take 300 mcg by mouth daily before breakfast. 09/18/17  Yes [provider]  loperamide (IMODIUM) 2 MG capsule Take 4 mg by mouth daily as needed for diarrhea or loose stools.    Yes [provider]  montelukast (SINGULAIR) 10 MG tablet TAKE 1 TABLET EVERY DAY WITH BREAKFAST 07/16/19  Yes Marin Olp, MD  nitroGLYCERIN (NITROSTAT) 0.4 MG SL tablet Place 1 tablet (0.4 mg total) under the tongue every 5 (five) minutes as needed for chest pain. 08/19/19  Yes Marin Olp, MD  Omega-3 Fatty Acids (FISH OIL) 1000 MG CAPS Take 1,000 mg by mouth daily.   Yes [provider]  phenazopyridine (PYRIDIUM) 200 MG tablet Take 1 tablet (200 mg total) by mouth 3 (three) times daily. 09/09/20  Yes Raylene Everts, MD  Propylene Glycol (SYSTANE COMPLETE OP) Place 1 drop into both eyes in the morning.   Yes [provider]  REPATHA SURECLICK [id number] MG/ML SOAJ  04/17/20  Yes  [provider]  TRUEplus Lancets 30G MISC Check blood sugar once per day 12/14/19  Yes Marin Olp, MD    Family History Family History  Problem Relation Age of Onset   Stroke Mother    Liver cancer Father    Cancer Father        liver   Breast cancer Neg Hx     Social History Social History   Tobacco Use   Smoking status: Former    Packs/day: 0.70    Years: 20.00    Pack years: 14.00    Types: Cigarettes    Quit date: 03/04/1978    Years since quitting: 42.5   Smokeless tobacco: Never  Vaping Use  Vaping Use: Never used  Substance Use Topics   Alcohol use: Yes    Alcohol/week: 2.0 standard drinks    Types: 2 Glasses of wine per week    Comment: with dinner  or social rare -    Drug use: No     Allergies   Flexeril [cyclobenzaprine], Ace inhibitors, Codeine, Singulair [montelukast], and Azithromycin   Review of Systems Review of Systems See HPI  Physical Exam Triage Vital Signs ED Triage Vitals  Enc Vitals Group     BP 09/09/20 1052 124/79     Pulse Rate 09/09/20 1052 66     Resp 09/09/20 1052 18     Temp 09/09/20 1052 98.4 F (36.9 C)     Temp Source 09/09/20 1052 Oral     SpO2 09/09/20 1052 95 %     Weight 09/09/20 1053 215 lb (97.5 kg)     Height 09/09/20 1053 '5\' 1"'$  (1.549 m)     Head Circumference --      Peak Flow --      Pain Score 09/09/20 1053 7     Pain Loc --      Pain Edu? --      Excl. in [place]? --    No data found.  Updated Vital Signs BP 124/79 (BP Location: Left Arm)   Pulse 66   Temp 98.4 F (36.9 C) (Oral)   Resp 18   Ht '5\' 1"'$  (1.549 m)   Wt 97.5 kg   SpO2 95%   BMI 40.62 kg/m      Physical Exam Constitutional:      General: She is not in acute distress.    Appearance: She is well-developed. She is obese.  HENT:     Head: Normocephalic and atraumatic.     Mouth/Throat:     Comments: Mask is in place Eyes:     Conjunctiva/sclera: Conjunctivae normal.     Pupils: Pupils are equal, round, and reactive  to light.  Cardiovascular:     Rate and Rhythm: Normal rate.  Pulmonary:     Effort: Pulmonary effort is normal. No respiratory distress.  Abdominal:     General: There is no distension.     Palpations: Abdomen is soft.     Tenderness: There is no right CVA tenderness or left CVA tenderness.  Musculoskeletal:        General: Normal range of motion.     Cervical back: Normal range of motion.  Skin:    General: Skin is warm and dry.  Neurological:     Mental Status: She is alert.  Psychiatric:        Mood and Affect: Mood normal.        Behavior: Behavior normal.     UC Treatments / Results  Labs (all labs ordered are listed, but only abnormal results are displayed) Labs Reviewed  POCT URINALYSIS DIP (MANUAL ENTRY) - Abnormal; Notable for the following components:      Result Value   Color, UA brown (*)    Clarity, UA cloudy (*)    Bilirubin, UA small (*)    Blood, UA large (*)    Protein Ur, POC >=300 (*)    Leukocytes, UA Large (3+) (*)    All other components within normal limits  URINE CULTURE    EKG   Radiology No results found.  Procedures Procedures (including critical care time)  Medications Ordered in UC Medications - No data to display  Initial Impression / Assessment  and Plan / UC Course  I have reviewed the triage vital signs and the nursing notes.  Pertinent labs & imaging results that were available during my care of the patient were reviewed by me and considered in my medical decision making (see chart for details).     Urine is sent for culture.  Patient is given Pyridium as well as an antibiotic.  Follow-up with primary care Final Clinical Impressions(s) / UC Diagnoses   Final diagnoses:  Acute cystitis without hematuria     Discharge Instructions      Drink lots of water Take the antibiotic 2 times a day for 5 days.  Take 2 doses today Take Pyridium if needed for urinary discomfort.  This will turn your urine orange and stain  clothing.  It is recommended you wear a pad while on this medicine Follow-up with your primary care doctor The urine culture has been sent to the laboratory.  You will be called if a change in antibiotic is necessary   ED Prescriptions     Medication Sig Dispense Auth. Provider   cephALEXin (KEFLEX) 500 MG capsule Take 1 capsule (500 mg total) by mouth 2 (two) times daily. 10 capsule Raylene Everts, MD   phenazopyridine (PYRIDIUM) 200 MG tablet Take 1 tablet (200 mg total) by mouth 3 (three) times daily. 6 tablet Raylene Everts, MD      PDMP not reviewed this encounter.   Raylene Everts, MD 09/10/20 [phone]

## 2020-09-11 ENCOUNTER — Encounter: Payer: Medicare HMO | Admitting: Physical Therapy

## 2020-09-12 LAB — URINE CULTURE
MICRO NUMBER:: 12271282
SPECIMEN QUALITY:: ADEQUATE

## 2020-09-13 ENCOUNTER — Telehealth: Payer: Self-pay

## 2020-09-13 ENCOUNTER — Telehealth: Payer: Medicare HMO

## 2020-09-13 NOTE — Progress Notes (Signed)
Spoke with pt and rescheduled her for Monday 8/29 @ 1pm.

## 2020-09-13 NOTE — Telephone Encounter (Signed)
Patient was scheduled for appointment  on 09/13/20 at 2:00 pm   Does not look like the patient has been reached out to be rescheduled.  Patient called in due to waiting on appointment.   Please advise & reschedule patient.

## 2020-09-14 ENCOUNTER — Telehealth: Payer: Self-pay

## 2020-09-14 ENCOUNTER — Encounter: Payer: Medicare HMO | Admitting: Physical Therapy

## 2020-09-14 DIAGNOSIS — N3000 Acute cystitis without hematuria: Secondary | ICD-10-CM

## 2020-09-14 NOTE — Telephone Encounter (Signed)
Patient called in stating that she went to a  urgent care and was given antibiotics for her UTI and she finished them yesterday but she wants her urine re tested to make sure theres nothing else going on she still feels off. She is scheduled for Tuesday but wants to know if we can just retest instead of her coming in

## 2020-09-14 NOTE — Telephone Encounter (Signed)
Please advise 

## 2020-09-15 ENCOUNTER — Other Ambulatory Visit: Payer: Self-pay

## 2020-09-15 ENCOUNTER — Other Ambulatory Visit: Payer: Medicare HMO

## 2020-09-15 ENCOUNTER — Encounter (HOSPITAL_COMMUNITY): Payer: Self-pay

## 2020-09-15 ENCOUNTER — Ambulatory Visit (HOSPITAL_COMMUNITY)
Admission: EM | Admit: 2020-09-15 | Discharge: 2020-09-15 | Disposition: A | Discharge status: Home / Self Care | Payer: Medicare HMO | Attending: Medical Oncology | Admitting: Medical Oncology

## 2020-09-15 DIAGNOSIS — N390 Urinary tract infection, site not specified: Secondary | ICD-10-CM | POA: Insufficient documentation

## 2020-09-15 DIAGNOSIS — N3000 Acute cystitis without hematuria: Secondary | ICD-10-CM | POA: Diagnosis not present

## 2020-09-15 LAB — POCT URINALYSIS DIPSTICK, ED / UC
Bilirubin Urine: NEGATIVE
Glucose, UA: NEGATIVE mg/dL
Ketones, ur: NEGATIVE mg/dL
Nitrite: NEGATIVE
Protein, ur: NEGATIVE mg/dL
Specific Gravity, Urine: 1.015 (ref 1.005–1.030)
Urobilinogen, UA: 0.2 mg/dL (ref 0.0–1.0)
pH: 5.5 (ref 5.0–8.0)

## 2020-09-15 MED ORDER — CIPROFLOXACIN HCL 500 MG PO TABS: 500.0000 mg | ORAL_TABLET | Freq: Two times a day (BID) | ORAL | 0 refills | Status: AC

## 2020-09-15 NOTE — ED Provider Notes (Signed)
[facility]    CSN: [id number] Arrival date & time: 09/15/20  1858      History   Chief Complaint Chief Complaint  Patient presents with   Urinary Tract Infection    HPI [name] is a 80 y.o. female.   HPI  UTI: Patient states that she had a UTI on 09/09/2020 which grew out E. coli that was pan sensitive.  She states that she took Keflex and symptoms improved but have returned.  She reports as of today she has had frequency, dysuria and urgency.  She denies any fevers, vomiting, abdominal pain.  Past Medical History:  Diagnosis Date   Anemia    Arthritis    osteoarthritis - shoulder, knees & hips   Asthma    Coronary artery disease    Depression    Diabetes mellitus without complication ([place])    no meds   Hypertension    Dr. Orinda Kenner manages BP, pt. reports MD has not found a need for treatment    Hypothyroidism    Sleep difficulties    had sleep study -2009, prior to gastric surgery, told that there was not a need for f/u   Thyroid disease    Vitamin B 12 deficiency     Patient Active Problem List   Diagnosis Date Noted   Primary osteoarthritis of right knee 06/28/2019   B12 deficiency 05/31/2019   Chest pain with high risk for cardiac etiology 04/02/2018   Hypercholesteremia    History of polymyalgia rheumatica 03/16/2018   CAD (coronary artery disease) 11/19/2017   Anxiety 07/17/2017   Osteopenia of left hip 07/17/2017   Overactive bladder 09/03/2016   Morbid obesity ([place]) 06/28/2015   Hypercalcemia 12/29/2014   Allergic rhinitis 07/20/2014   History of gastric bypass 02/08/2014   OA (osteoarthritis) of knee 10/05/2013   Hyperlipidemia associated with type 2 diabetes mellitus ([place]) 01/05/2008   CHEST WALL PAIN, ANTERIOR 10/09/2007   Hypothyroidism 09/09/2007   Well controlled type 2 diabetes mellitus ([place]) 07/18/2006   Major depression, recurrent, full remission ([place]) 07/18/2006   Hypertension associated with diabetes ([place])  07/18/2006   Asthma 07/18/2006    Past Surgical History:  Procedure Laterality Date   ABDOMINAL HYSTERECTOMY     BARIATRIC [place]     [facility]- 30 yrs. ago   CATARACT EXTRACTION, BILATERAL     late 2018   LEFT HEART CATH AND CORONARY ANGIOGRAPHY N/A 04/02/2018   Procedure: LEFT HEART CATH AND CORONARY ANGIOGRAPHY;  Surgeon: Martinique, Peter M, MD;  Location: [place] [facility];  Service: Cardiovascular;  Laterality: N/A;   OOPHORECTOMY     pantallor arthrodesis with rod placement left foot     TONSILLECTOMY     TOTAL KNEE ARTHROPLASTY Left 11/30/2018   Procedure: TOTAL KNEE ARTHROPLASTY;  Surgeon: Gaynelle Arabian, MD;  [facility];  Service: Orthopedics;  Laterality: Left;   TOTAL KNEE ARTHROPLASTY Right 06/28/2019   Procedure: TOTAL KNEE ARTHROPLASTY;  Surgeon: Gaynelle Arabian, MD;  [facility];  Service: Orthopedics;  Laterality: Right;  35mn   TOTAL SHOULDER ARTHROPLASTY Left 03/12/2012   Procedure: TOTAL SHOULDER ARTHROPLASTY;  Surgeon: KMarin Shutter MD;  [place]  Service: Orthopedics;  Laterality: Left;    OB History   No obstetric history on file.      Home Medications    Prior to Admission medications   Medication Sig Start Date End Date Taking? Authorizing Provider  acetaminophen (TYLENOL) 500  MG tablet Take 1,000 mg by mouth every 8 (eight) hours as needed for mild pain or headache.    [provider]  albuterol (VENTOLIN HFA) 108 (90 Base) MCG/ACT inhaler Inhale 1-2 puffs into the lungs every 4 (four) hours as needed for wheezing or shortness of breath. 08/18/19   Marin Olp, MD  amLODipine (NORVASC) 10 MG tablet Take 1 tablet (10 mg total) by mouth daily. 02/25/20   Marin Olp, MD  aspirin EC 81 MG tablet Take 81 mg by mouth daily. Swallow whole.    [provider]  cephALEXin (KEFLEX) 500 MG capsule Take 1 capsule (500 mg total) by mouth 2 (two) times daily. 09/09/20   Raylene Everts, MD   Cholecalciferol (VITAMIN D-3) 125 MCG (5000 UT) TABS Take 5,000 Units by mouth daily.    [provider]  cyanocobalamin (,VITAMIN B-12,) 1000 MCG/ML injection Inject 1 mL (1,000 mcg total) into the muscle every 30 (thirty) days. Uses on amonthly basis Patient taking differently: Inject 1,000 mcg into the muscle every 30 (thirty) days. 11/18/18   Marin Olp, MD  desvenlafaxine (PRISTIQ) 100 MG 24 hr tablet Take 1 tablet (100 mg total) by mouth daily. 08/09/20   Marin Olp, MD  diclofenac (VOLTAREN) 75 MG EC tablet Take 1 tablet (75 mg total) by mouth daily. 03/09/20   Marin Olp, MD  FeFum-FePo-FA-B Cmp-C-Zn-Mn-Cu (SE-TAN PLUS) [phone] MG CAPS TAKE ONE CAPSULE BY MOUTH TWICE A DAY 03/27/20   Marin Olp, MD  fexofenadine (ALLEGRA) 180 MG tablet Take 180 mg by mouth daily as needed for allergies or rhinitis.    [provider]  fluticasone (FLONASE) 50 MCG/ACT nasal spray Place 2 sprays into both nostrils daily. 08/21/20   Marin Olp, MD  glucose blood test strip Test blood sugar once per day 12/14/19   Marin Olp, MD  Levothyroxine Sodium 150 MCG CAPS Take 300 mcg by mouth daily before breakfast. 09/18/17   [provider]  loperamide (IMODIUM) 2 MG capsule Take 4 mg by mouth daily as needed for diarrhea or loose stools.     [provider]  montelukast (SINGULAIR) 10 MG tablet TAKE 1 TABLET EVERY DAY WITH BREAKFAST 07/16/19   Marin Olp, MD  nitroGLYCERIN (NITROSTAT) 0.4 MG SL tablet Place 1 tablet (0.4 mg total) under the tongue every 5 (five) minutes as needed for chest pain. 08/19/19   Marin Olp, MD  Omega-3 Fatty Acids (FISH OIL) 1000 MG CAPS Take 1,000 mg by mouth daily.    [provider]  phenazopyridine (PYRIDIUM) 200 MG tablet Take 1 tablet (200 mg total) by mouth 3 (three) times daily. 09/09/20   Raylene Everts, MD  Propylene Glycol (SYSTANE COMPLETE OP) Place 1 drop into both eyes in the  morning.    [provider]  REPATHA SURECLICK [id number] MG/ML SOAJ  04/17/20   [provider]  TRUEplus Lancets 30G MISC Check blood sugar once per day 12/14/19   Marin Olp, MD    Family History Family History  Problem Relation Age of Onset   Stroke Mother    Liver cancer Father    Cancer Father        liver   Breast cancer Neg Hx     Social History Social History   Tobacco Use   Smoking status: Former    Packs/day: 0.70    Years: 20.00    Pack years: 14.00    Types:  Cigarettes    Quit date: 03/04/1978    Years since quitting: 42.5   Smokeless tobacco: Never  Vaping Use   Vaping Use: Never used  Substance Use Topics   Alcohol use: Yes    Alcohol/week: 2.0 standard drinks    Types: 2 Glasses of wine per week    Comment: with dinner  or social rare -    Drug use: No     Allergies   Flexeril [cyclobenzaprine], Ace inhibitors, Codeine, Singulair [montelukast], and Azithromycin   Review of Systems Review of Systems  As stated above in HPI Physical Exam Triage Vital Signs ED Triage Vitals  Enc Vitals Group     BP 09/15/20 2001 (!) 164/78     Pulse Rate 09/15/20 2001 66     Resp 09/15/20 2001 18     Temp 09/15/20 2001 97.8 F (36.6 C)     Temp Source 09/15/20 2001 Oral     SpO2 09/15/20 2001 97 %     Weight --      Height --      Head Circumference --      Peak Flow --      Pain Score 09/15/20 2000 6     Pain Loc --      Pain Edu? --      Excl. in [place]? --    No data found.  Updated Vital Signs BP (!) 164/78 (BP Location: Right Arm)   Pulse 66   Temp 97.8 F (36.6 C) (Oral)   Resp 18   SpO2 97%   Physical Exam Vitals and nursing note reviewed.  Constitutional:      General: She is not in acute distress.    Appearance: Normal appearance. She is obese. She is not ill-appearing, toxic-appearing or diaphoretic.  HENT:     Head: Normocephalic and atraumatic.  Cardiovascular:     Rate and Rhythm: Normal rate and regular rhythm.      Heart sounds: Normal heart sounds.  Pulmonary:     Effort: Pulmonary effort is normal.     Breath sounds: Normal breath sounds.  Abdominal:     General: Bowel sounds are normal. There is no distension.     Palpations: Abdomen is soft. There is no mass.     Tenderness: There is no abdominal tenderness. There is no right CVA tenderness, left CVA tenderness, guarding or rebound.     Hernia: No hernia is present.  Lymphadenopathy:     Cervical: No cervical adenopathy.  Skin:    General: Skin is warm.  Neurological:     Mental Status: She is alert and oriented to person, place, and time.     UC Treatments / Results  Labs (all labs ordered are listed, but only abnormal results are displayed) Labs Reviewed  URINE CULTURE  POCT URINALYSIS DIPSTICK, ED / UC    EKG   Radiology No results found.  Procedures Procedures (including critical care time)  Medications Ordered in UC Medications - No data to display  Initial Impression / Assessment and Plan / UC Course  I have reviewed the triage vital signs and the nursing notes.  Pertinent labs & imaging results that were available during my care of the patient were reviewed by me and considered in my medical decision making (see chart for details).     New.  Urinalysis is suggestive of complex UTI.  Patient request ciprofloxacin.  We discussed risks, benefits and blackbox warnings along with alternatives.  At this time  she feels most comfortable with ciprofloxacin as she has done well with this in the past.  She will stay hydrated with water and follow-up as needed. Final Clinical Impressions(s) / UC Diagnoses   Final diagnoses:  None   Discharge Instructions   None    ED Prescriptions   None    PDMP not reviewed this encounter.   Hughie Closs, Hershal Coria 09/15/20 2025

## 2020-09-15 NOTE — ED Triage Notes (Signed)
Patient c/o cloudy urine, burning when urinating.  Started: today

## 2020-09-15 NOTE — Telephone Encounter (Signed)
Called and lm for pt tcb, urine culture orderd mychart message has been sent also.

## 2020-09-17 LAB — URINE CULTURE
MICRO NUMBER:: 12296980
SPECIMEN QUALITY:: ADEQUATE

## 2020-09-18 ENCOUNTER — Telehealth: Payer: Self-pay

## 2020-09-18 ENCOUNTER — Telehealth: Payer: Medicare HMO

## 2020-09-18 LAB — URINE CULTURE: Culture: 40000 — AB

## 2020-09-18 NOTE — Telephone Encounter (Signed)
Visit tomorrow was canceled since patient was already treated

## 2020-09-18 NOTE — Telephone Encounter (Signed)
Someone has resulted the culture that you wanted done, do you still want to see pt tomorrow?

## 2020-09-18 NOTE — Telephone Encounter (Signed)
Patient was seen at [facility] on 09/16/20 and wants to know if she still needs to keep her appt on 09/19/20. Please advise.

## 2020-09-19 ENCOUNTER — Ambulatory Visit: Payer: Medicare HMO | Admitting: Family Medicine

## 2020-09-28 NOTE — Progress Notes (Deleted)
Chronic Care Management Pharmacy Note  09/28/2020 Name:  [name] MRN:  [id number] DOB:  Jun 20, 1940  Summary: ***  Recommendations/Changes made from today's visit: ***  Plan: ***   Subjective: [name] is an 80 y.o. year old female who is a primary patient of Hunter, Brayton Mars, MD.  The CCM team was consulted for assistance with disease management and care coordination needs.    Engaged with patient by telephone for initial visit in response to provider referral for pharmacy case management and/or care coordination services.   Consent to Services:  The patient was given the following information about Chronic Care Management services today, agreed to services, and gave verbal consent: 1. CCM service includes personalized support from designated clinical staff supervised by the primary care provider, including individualized plan of care and coordination with other care providers 2. 24/7 contact phone numbers for assistance for urgent and routine care needs. 3. Service will only be billed when office clinical staff spend 20 minutes or more in a month to coordinate care. 4. Only one practitioner may furnish and bill the service in a calendar month. 5.The patient may stop CCM services at any time (effective at the end of the month) by phone call to the office staff. 6. The patient will be responsible for cost sharing (co-pay) of up to 20% of the service fee (after annual deductible is met). Patient agreed to services and consent obtained.  Patient Care Team: Marin Olp, MD as PCP - General (Family Medicine) Belva Crome, MD as PCP - Cardiology (Cardiology) Clarene Essex, MD as Consulting Physician (Gastroenterology) Altheimer, Legrand Como, MD as Referring Physician (Endocrinology) Luberta Mutter, MD as Consulting Physician (Ophthalmology) Gaynelle Arabian, MD as Consulting Physician (Orthopedic Surgery) Johnathan Hausen, MD as Consulting Physician (General  Surgery) Raynelle Bring, MD as Consulting Physician (Urology) Rozetta Nunnery, MD as Consulting Physician (Otolaryngology) Gaynelle Arabian, MD as Consulting Physician (Orthopedic Surgery) Edythe Clarity, [facility] as Pharmacist (Pharmacist)  Recent office visits:  03/31/20 Video Visit, Allwardt, Alyssa PA-C, Acute Visit. No medication changes, No follow ups on file. 07/03/20 Office Visit, Billey Chang MD, Acute Visit Started Amoxicillin 875 mg 2 times daily for 5 days for Acute Cystitis. Follow up as needed. 07/11/20 Office Visit, Garret Reddish MD, Started RX Hydrocodone-Acetaminophen 5-325 mg take 1-2 tablets by mouth every 6 hours as needed. Follow up in 4 months. 07/11/20 Patient Message, Garret Reddish MD, refilled Desvenlafaxine Suc ER 100 mg. No follow ups on file    Recent consult visits:  None in the last 6 months   Hospital visits:  09/09/20 [facility], Blanchie Serve MD Acute Cystitis, Started Cephalexin 500 mg 2 times daily for 5 days, Started Phenazopyridine HCI 200 mg 3 times daily for 2 days. Discontinued Hydrocodone 5-325 mg 1-2 tablets every 6 hours as needed and Prednisone 60m. No follows up on file . 09/15/20 [facility] seen for UTI Started Ciprofloxacin HCI 500 mg every 12 hours. NO follow ups on file    Objective:  Lab Results  Component Value Date   CREATININE 0.92 07/11/2020   BUN 15 07/11/2020   GFR 58.88 (L) 07/11/2020   GFRNONAA 64 10/19/2019   GFRAA 74 10/19/2019   NA 135 07/11/2020   K 3.8 07/11/2020   CALCIUM 10.6 (H) 07/11/2020   CO2 29 07/11/2020   GLUCOSE 125 (H) 07/11/2020    Lab Results  Component Value Date/Time   HGBA1C 6.8 (H) 07/11/2020 10:35 AM  HGBA1C 6.5 03/09/2020 12:20 PM   GFR 58.88 (L) 07/11/2020 10:35 AM   GFR 57.52 (L) 03/09/2020 12:20 PM   MICROALBUR 2.1 (H) 07/11/2020 10:35 AM   MICROALBUR <0.7 05/31/2019 01:55 PM    Last diabetic Eye exam:  Lab Results  Component Value Date/Time   HMDIABEYEEXA  No Retinopathy 12/24/2019 02:36 PM    Last diabetic Foot exam:  Lab Results  Component Value Date/Time   HMDIABFOOTEX done 09/19/2009 12:00 AM     Lab Results  Component Value Date   CHOL 150 08/30/2019   HDL 82 08/30/2019   LDLCALC 49 08/30/2019   LDLDIRECT 60.0 05/31/2019   TRIG 96 08/30/2019   CHOLHDL 1.8 08/30/2019    Hepatic Function Latest Ref Rng & Units 07/11/2020 05/30/2020 03/09/2020  Total Protein 6.0 - 8.3 g/dL 7.1 - 6.7  Albumin 3.5 - 5.2 g/dL 4.3 4.6 4.0  AST 0 - 37 U/L _0 ALT 0 - 35 U/L _1 Alk Phosphatase 39 - 117 U/L 112 160(A) 110  Total Bilirubin 0.2 - 1.2 mg/dL 0.6 - 0.5  Bilirubin, Direct 0.0 - 0.3 mg/dL - - -    Lab Results  Component Value Date/Time   TSH 3.02 01/01/2019 12:00 AM   TSH 0.10 (L) 07/30/2011 10:46 AM   TSH 0.07 (L) 02/25/2011 10:03 AM   FREET4 0.81 02/25/2011 10:03 AM   FREET4 0.71 06/06/2010 11:56 AM    CBC Latest Ref Rng & Units 05/30/2020 10/19/2019 08/29/2019  WBC - 10.9 10.1 10.7(H)  Hemoglobin 12.0 - 16.0 14.1 14.2 12.7  Hematocrit 36 - 46 42 42.8 41.1  Platelets 150 - 399 227 227 251    Lab Results  Component Value Date/Time   VD25OH 44.68 07/11/2020 10:35 AM   VD25OH 43.94 12/30/2014 09:57 AM    Clinical ASCVD: {YES/NO:21197} The ASCVD Risk score (Arnett DK, et al., 2019) failed to calculate for the following reasons:   The 2019 ASCVD risk score is only valid for ages 38 to 25    Depression screen PHQ 2/9 07/03/2020 07/03/2020 03/09/2020  Decreased Interest 0 0 3  Down, Depressed, Hopeless 0 0 0  PHQ - 2 Score 0 0 3  Altered sleeping 0 - 3  Tired, decreased energy 0 - 3  Change in appetite 0 - 3  Feeling bad or failure about yourself  0 - 0  Trouble concentrating 0 - 1  Moving slowly or fidgety/restless 0 - 0  Suicidal thoughts 0 - 0  PHQ-9 Score 0 - 13  Difficult doing work/chores Not difficult at all - Not difficult at all  Some recent data might be hidden     ***Other: (CHADS2VASc if Afib, MMRC or  CAT for COPD, ACT, DEXA)  Social History   Tobacco Use  Smoking Status Former   Packs/day: 0.70   Years: 20.00   Pack years: 14.00   Types: Cigarettes   Quit date: 03/04/1978   Years since quitting: 42.6  Smokeless Tobacco Never   BP Readings from Last 3 Encounters:  09/15/20 (!) 164/78  09/09/20 124/79  07/11/20 135/80   Pulse Readings from Last 3 Encounters:  09/15/20 66  09/09/20 66  07/11/20 69   Wt Readings from Last 3 Encounters:  09/09/20 215 lb (97.5 kg)  07/11/20 212 lb (96.2 kg)  07/03/20 213 lb 3.2 oz (96.7 kg)   BMI Readings from Last 3 Encounters:  09/09/20 40.62 kg/m  07/11/20 40.06 kg/m  07/03/20 40.28 kg/m  Assessment/Interventions: Review of patient past medical history, allergies, medications, health status, including review of consultants reports, laboratory and other test data, was performed as part of comprehensive evaluation and provision of chronic care management services.   SDOH:  (Social Determinants of Health) assessments and interventions performed: {yes/no:20286}  SDOH Screenings   Alcohol Screen: Not on file  Depression (PHQ2-9): Low Risk    PHQ-2 Score: 0  Financial Resource Strain: Not on file  Food Insecurity: Not on file  Housing: Not on file  Physical Activity: Not on file  Social Connections: Not on file  Stress: Not on file  Tobacco Use: Medium Risk   Smoking Tobacco Use: Former   Smokeless Tobacco Use: Never  Transportation Needs: Not on file    [place]  Allergies  Allergen Reactions   Flexeril [Cyclobenzaprine] Other (See Comments)    On Pristiq- possible serotonin reaction to flexeril   Ace Inhibitors Other (See Comments)    States after a surgery she was told to "not take" this class of drugs- not 100% sure what the reason was   Codeine Nausea Only and Other (See Comments)    Upset stomach   Singulair [Montelukast] Other (See Comments)    Causes depression   Azithromycin Rash    Medications  Reviewed Today     Reviewed by Vallery Sa (Physician Assistant Certified) on 93/26/71 at 2009  Med List Status: <None>   Medication Order Taking? Sig Documenting Provider Last Dose Status Informant  acetaminophen (TYLENOL) 500 MG tablet [id number]  Take 1,000 mg by mouth every 8 (eight) hours as needed for mild pain or headache. [provider]  Active Self  albuterol (VENTOLIN HFA) 108 (90 Base) MCG/ACT inhaler [id number]  Inhale 1-2 puffs into the lungs every 4 (four) hours as needed for wheezing or shortness of breath. Marin Olp, MD  Active Self  amLODipine (NORVASC) 10 MG tablet [id number]  Take 1 tablet (10 mg total) by mouth daily. Marin Olp, MD  Active   aspirin EC 81 MG tablet [id number]  Take 81 mg by mouth daily. Swallow whole. [provider]  Active Self  cephALEXin (KEFLEX) 500 MG capsule [id number]  Take 1 capsule (500 mg total) by mouth 2 (two) times daily. Raylene Everts, MD  Active   Cholecalciferol (VITAMIN D-3) 125 MCG (5000 UT) TABS [id number]  Take 5,000 Units by mouth daily. [provider]  Active Self  cyanocobalamin (,VITAMIN B-12,) 1000 MCG/ML injection [id number]  Inject 1 mL (1,000 mcg total) into the muscle every 30 (thirty) days. Uses on amonthly basis  Patient taking differently: Inject 1,000 mcg into the muscle every 30 (thirty) days.   Marin Olp, MD  Active   desvenlafaxine (PRISTIQ) 100 MG 24 hr tablet [id number]  Take 1 tablet (100 mg total) by mouth daily. Marin Olp, MD  Active   diclofenac (VOLTAREN) 75 MG EC tablet [id number]  Take 1 tablet (75 mg total) by mouth daily. Marin Olp, MD  Active   FeFum-FePo-FA-B Cmp-C-Zn-Mn-Cu [facility] PLUS) 162-115.2-1 MG CAPS [id number]  TAKE ONE CAPSULE BY MOUTH TWICE A DAY Marin Olp, MD  Active   fexofenadine (ALLEGRA) 180 MG tablet [id number]  Take 180 mg by mouth daily as needed for allergies or rhinitis. [provider]  Active    fluticasone (FLONASE) 50 MCG/ACT nasal spray [id number]  Place 2 sprays into both nostrils daily. Marin Olp, MD  Active   glucose blood test strip [id number]  Test blood sugar once per day Marin Olp, MD  Active   Levothyroxine Sodium 150 MCG CAPS [id number]  Take 300 mcg by mouth daily before breakfast. [provider]  Active            Med Note Duffy Bruce, Sherrie Mustache Aug 29, 2019  9:39 PM) Patient prefers to take these one hour prior to her breakfast  loperamide (IMODIUM) 2 MG capsule [id number]  Take 4 mg by mouth daily as needed for diarrhea or loose stools.  [provider]  Active Self  montelukast (SINGULAIR) 10 MG tablet [id number]  TAKE 1 TABLET EVERY DAY WITH BREAKFAST Marin Olp, MD  Active   nitroGLYCERIN (NITROSTAT) 0.4 MG SL tablet [id number]  Place 1 tablet (0.4 mg total) under the tongue every 5 (five) minutes as needed for chest pain. Marin Olp, MD  Active Self  Omega-3 Fatty Acids (FISH OIL) 1000 MG CAPS [id number]  Take 1,000 mg by mouth daily. [provider]  Active   phenazopyridine (PYRIDIUM) 200 MG tablet [id number]  Take 1 tablet (200 mg total) by mouth 3 (three) times daily. Raylene Everts, MD  Active   Propylene Glycol [facility] COMPLETE OP) [id number]  Place 1 drop into both eyes in the morning. [provider]  Active Self  REPATHA [place] 759 MG/ML SOAJ [id number]   [provider]  Active   TRUEplus Lancets 30G MISC [id number]  Check blood sugar once per day Marin Olp, MD  Active             Patient Active Problem List   Diagnosis Date Noted   Primary osteoarthritis of right knee 06/28/2019   B12 deficiency 05/31/2019   Chest pain with high risk for cardiac etiology 04/02/2018   Hypercholesteremia    History of polymyalgia rheumatica 03/16/2018   CAD (coronary artery disease) 11/19/2017   Anxiety 07/17/2017   Osteopenia of left hip 07/17/2017   Overactive bladder 09/03/2016    Morbid obesity ([place]) 06/28/2015   Hypercalcemia 12/29/2014   Allergic rhinitis 07/20/2014   History of gastric bypass 02/08/2014   OA (osteoarthritis) of knee 10/05/2013   Hyperlipidemia associated with type 2 diabetes mellitus ([place]) 01/05/2008   CHEST WALL PAIN, ANTERIOR 10/09/2007   Hypothyroidism 09/09/2007   Well controlled type 2 diabetes mellitus ([place]) 07/18/2006   Major depression, recurrent, full remission ([place]) 07/18/2006   Hypertension associated with diabetes ([place]) 07/18/2006   Asthma 07/18/2006    Immunization History  Administered Date(s) Administered   Fluad Quad(high Dose 65+) 09/22/2018, 10/19/2019   Influenza Split 10/22/2010, 11/14/2011   Influenza Whole 11/13/2006, 10/27/2008   Influenza, High Dose Seasonal PF 10/28/2014, 09/28/2015, 10/18/2016, 11/19/2017   Influenza,inj,Quad PF,6+ Mos 10/05/2012, 10/05/2013   Influenza-Unspecified 10/21/2013   PFIZER(Purple Top)SARS-COV-2 Vaccination 02/11/2019, 03/04/2019, 09/04/2019, 03/11/2020   Pneumococcal Conjugate-13 03/01/2013, 10/21/2013   Pneumococcal Polysaccharide-23 05/31/2006   Td 11/24/1998, 03/01/2013   Zoster Recombinat (Shingrix) 10/31/2016, 05/01/2017   Zoster, Live 06/20/2006    Conditions to be addressed/monitored:  HTN, CAD, Type II DM, Hypothyroidism, HLD, Osteoarthritis  There are no care plans that you recently modified to display for this patient.    Medication Assistance: {MEDASSISTANCEINFO:25044}  Compliance/Adherence/Medication fill history: Care Gaps: ***  Star-Rating Drugs: ***  Patient's preferred pharmacy is:  [facility], [place] [address] Phone: [phone] Fax: [phone]  [facility] ([facility]) - [address]  [place], [place] [address] Phone: [phone] Fax: [phone]  [facility], [place] [place]. [place] [address] Phone: [phone] Fax: [place], [place] - [place] [address] Phone: [phone] Fax: [phone]  [facility] [id number] - [address], [place] [address] [place] [address] Phone: [phone] Fax: [phone]  Uses pill box? {Yes or If no, why not?:20788} Pt endorses ***% compliance  We discussed: {Pharmacy options:24294} Patient decided to: {US Pharmacy GURK:27062}  Care Plan and Follow Up Patient Decision:  {FOLLOWUP:24991}  Plan: {CM FOLLOW UP BJSE:83151}  Beverly Milch, PharmD Clinical Pharmacist  [phone]   Current Barriers:  {pharmacybarriers:24917}  Pharmacist Clinical Goal(s):  Patient will {PHARMACYGOALCHOICES:24921} through collaboration with PharmD and provider.   Interventions: 1:1 collaboration with Marin Olp, MD regarding development and update of comprehensive plan of care as evidenced by provider attestation and co-signature Inter-disciplinary care team collaboration (see longitudinal plan of care) Comprehensive medication review performed; medication list updated in electronic medical record  Hypertension (BP goal {CHL HP UPSTREAM Pharmacist BP [phone]}) -{US controlled/uncontrolled:25276} -Current treatment: *** -Medications previously tried: ***  -Current home readings: *** -Current dietary habits: *** -Current exercise habits: *** -{ACTIONS;DENIES/REPORTS:21021675::"Denies"} hypotensive/hypertensive symptoms -Educated on {CCM BP Counseling:25124} -Counseled to monitor BP at home ***, document, and provide log at future appointments -{CCMPHARMDINTERVENTION:25122}  Hyperlipidemia: (LDL goal < ***) -{US controlled/uncontrolled:25276} -Current treatment: *** -Medications  previously tried: ***  -Current dietary patterns: *** -Current exercise habits: *** -Educated on {CCM HLD Counseling:25126} -{CCMPHARMDINTERVENTION:25122}  Diabetes (A1c goal {A1c goals:23924}) -{US controlled/uncontrolled:25276} -Current medications: *** -Medications previously tried: ***  -Current home glucose readings fasting glucose: *** post prandial glucose: *** -{ACTIONS;DENIES/REPORTS:21021675::"Denies"} hypoglycemic/hyperglycemic symptoms -Current meal patterns:  breakfast: ***  lunch: ***  dinner: *** snacks: *** drinks: *** -Current exercise: *** -Educated on {CCM DM COUNSELING:25123} -Counseled to check feet daily and get yearly eye exams -{CCMPHARMDINTERVENTION:25122}  Osteoarthritis (Goal: ***) -{US controlled/uncontrolled:25276} -Current treatment  *** -Medications previously tried: ***  -{CCMPHARMDINTERVENTION:25122}  Hypothyroidism (Goal: ***) -{US controlled/uncontrolled:25276} -Current treatment  *** -Medications previously tried: ***  -{CCMPHARMDINTERVENTION:25122}  Patient Goals/Self-Care Activities Patient will:  - {pharmacypatientgoals:24919}  Follow Up Plan: {CM FOLLOW UP GYIR:48546}

## 2020-09-29 ENCOUNTER — Telehealth: Payer: Self-pay | Admitting: Pharmacist

## 2020-09-29 NOTE — Progress Notes (Addendum)
Chronic Care Management Pharmacy Assistant   Name: CHIMERA ALOE  MRN: [id number] DOB: Jun 09, 1940  [name] is an 80 y.o. year old female who presents for his initial CCM visit with the clinical pharmacist.  Reason for Encounter: Chart Review for initial appointment with clinical pharmacist    Conditions to be addressed/monitored: Hypertension, CAD, DM, Asthma, Hypothyroidism, Hyperlipidemia, Osteoarthritis,  Depression, Morbid Obesity, B 12 Deficiency  Primary concerns for visit include: Hypertension, DM, CAD, Hyperlipidemia  Recent office visits:  03/31/20 Video Visit, Allwardt, Alyssa PA-C, Acute Visit. No medication changes, No follow ups on file. 07/03/20 Office Visit, Billey Chang MD, Acute Visit Started Amoxicillin 875 mg 2 times daily for 5 days for Acute Cystitis. Follow up as needed. 07/11/20 Office Visit, Garret Reddish MD, Started RX Hydrocodone-Acetaminophen 5-325 mg take 1-2 tablets by mouth every 6 hours as needed. Follow up in 4 months. 07/11/20 Patient Message, Garret Reddish MD, refilled Desvenlafaxine Suc ER 100 mg. No follow ups on file   Recent consult visits:  None in the last 6 months  Hospital visits:  09/09/20 [facility], Blanchie Serve MD Acute Cystitis, Started Cephalexin 500 mg 2 times daily for 5 days, Started Phenazopyridine HCI 200 mg 3 times daily for 2 days. Discontinued Hydrocodone 5-325 mg 1-2 tablets every 6 hours as needed and Prednisone '5mg'$ . No follows up on file . 09/15/20 [facility] seen for UTI Started Ciprofloxacin HCI 500 mg every 12 hours. NO follow ups on file   Medications: Outpatient Encounter Medications as of 09/29/2020  Medication Sig Note   acetaminophen (TYLENOL) 500 MG tablet Take 1,000 mg by mouth every 8 (eight) hours as needed for mild pain or headache.    albuterol (VENTOLIN HFA) 108 (90 Base) MCG/ACT inhaler Inhale 1-2 puffs into the lungs every 4 (four) hours as needed for wheezing or  shortness of breath.    amLODipine (NORVASC) 10 MG tablet Take 1 tablet (10 mg total) by mouth daily.    aspirin EC 81 MG tablet Take 81 mg by mouth daily. Swallow whole.    cephALEXin (KEFLEX) 500 MG capsule Take 1 capsule (500 mg total) by mouth 2 (two) times daily.    Cholecalciferol (VITAMIN D-3) 125 MCG (5000 UT) TABS Take 5,000 Units by mouth daily.    cyanocobalamin (,VITAMIN B-12,) 1000 MCG/ML injection Inject 1 mL (1,000 mcg total) into the muscle every 30 (thirty) days. Uses on amonthly basis (Patient taking differently: Inject 1,000 mcg into the muscle every 30 (thirty) days.)    desvenlafaxine (PRISTIQ) 100 MG 24 hr tablet Take 1 tablet (100 mg total) by mouth daily.    diclofenac (VOLTAREN) 75 MG EC tablet Take 1 tablet (75 mg total) by mouth daily.    FeFum-FePo-FA-B Cmp-C-Zn-Mn-Cu (SE-TAN PLUS) 162-115.2-1 MG CAPS TAKE ONE CAPSULE BY MOUTH TWICE A DAY    fexofenadine (ALLEGRA) 180 MG tablet Take 180 mg by mouth daily as needed for allergies or rhinitis.    fluticasone (FLONASE) 50 MCG/ACT nasal spray Place 2 sprays into both nostrils daily.    glucose blood test strip Test blood sugar once per day    Levothyroxine Sodium 150 MCG CAPS Take 300 mcg by mouth daily before breakfast. 08/29/2019: Patient prefers to take these one hour prior to her breakfast   loperamide (IMODIUM) 2 MG capsule Take 4 mg by mouth daily as needed for diarrhea or loose stools.     montelukast (SINGULAIR) 10 MG tablet TAKE 1 TABLET EVERY  DAY WITH BREAKFAST    nitroGLYCERIN (NITROSTAT) 0.4 MG SL tablet Place 1 tablet (0.4 mg total) under the tongue every 5 (five) minutes as needed for chest pain.    Omega-3 Fatty Acids (FISH OIL) 1000 MG CAPS Take 1,000 mg by mouth daily.    phenazopyridine (PYRIDIUM) 200 MG tablet Take 1 tablet (200 mg total) by mouth 3 (three) times daily.    Propylene Glycol (SYSTANE COMPLETE OP) Place 1 drop into both eyes in the morning.    REPATHA [place] [id number] MG/ML SOAJ     TRUEplus  Lancets 30G MISC Check blood sugar once per day    No facility-administered encounter medications on file as of 09/29/2020.   Current Medications: Pristiq 100 mg 1 tablet daily, last filled 08/09/20 [id number] Repatha Sureclick [id number] mg/mL, last filled 07/13/20 84ds SE-TAN Plus 116--115.2-1 mg one capsule twice daily, last filled 03/09/20 Not taking since taking prednisone Diclofenac 75 mg last filled 05/10/2020 90 DS has plenty of supply Amlodipine 10 mg last filled 08/08/20 90ds Fexofenadine 180 mg as needed  Nitroglycerin 0.4 mg last filled on 08/19/19 30ds as needed  Aspirin 81 mg 06/29/19 20ds?? Taking OTC Albuterol (Ventolin HFA0 108 mcg/act inhaler last filled 08/18/2019 Using as needed  Montelukast 10 mg last filled on 02/21/20 90ds not taking but will start during allergy season  Cyanocobalamin 1000 mcg/mL injection last filled on 11/18/18 30ds Last Injection was 1 month ago Cyanocobalamin 125 mcg once a day not taking  Omega-3 Fatty Acids 1000 mg daily Levothyroxine 150 mcg two capsules before breakfast last filled on 08/11/20 73ds  Tylenol 500 mg 1 daily  Propylene Glycol not taking  Loperamide 2 mg as needed  Prednisone 5 mgTaking for a few month   Patient Questions:  Any changes in your medications or health?  Pt stated is on prednisone 5 mg for a few months  Any side effects from any medications?  Pt states since she has been on prednisone she has experienced dizziness and headaches since taking this medication. Pt states the symptoms come and go.  Do you have any symptoms or problems not managed by your medications? Pt states no issues  Any concerns about your health right now? Pt states no changes in health   Has your provider asked that you check blood pressure, blood sugar, or follow special diet at home? Pt states is not checking BP on regular basis and is checking blood sugars and last reading was between 110  to 120.   Do you get any type of exercise on a regular basis? Pt  states no regular exercise but is cleaning house  Can you think of a goal you would like to reach for your health? Pt states she is trying to eat healthier   Do you have any problems getting your medications? Patient stated not having any issues getting medications  Is there anything that you would like to discuss during the appointment?  Pt stated nothing else to add for now  Please bring medications and supplements to appointment. Pt was reminded to have these ready for appointment.   Care Gaps: Medicare Annual Wellness: Ophthalmology Exam: Ordered on 03/09/20 Foot Exam: Overdue since 05/30/20 Hemoglobin A1C: 07/11/20 (6.8) Colonoscopy: Not available  Dexa Scan: Completed  Mammogram: Not available   Future Appointments  Date Time Provider [place]  10/02/2020  3:00 PM [facility] CCM PHARMACIST [facility]  10/26/2020  1:45 PM Leanor Kail, PA [facility]  11/16/2020  9:40 AM Marin Olp,  MD [facility]     Star Rating Drugs: None  [place]

## 2020-10-02 ENCOUNTER — Telehealth: Payer: Medicare HMO

## 2020-10-03 ENCOUNTER — Ambulatory Visit (INDEPENDENT_AMBULATORY_CARE_PROVIDER_SITE_OTHER): Payer: Medicare HMO | Admitting: Pharmacist

## 2020-10-03 DIAGNOSIS — E039 Hypothyroidism, unspecified: Secondary | ICD-10-CM

## 2020-10-03 DIAGNOSIS — E119 Type 2 diabetes mellitus without complications: Secondary | ICD-10-CM

## 2020-10-03 DIAGNOSIS — E7800 Pure hypercholesterolemia, unspecified: Secondary | ICD-10-CM

## 2020-10-03 DIAGNOSIS — E1159 Type 2 diabetes mellitus with other circulatory complications: Secondary | ICD-10-CM

## 2020-10-03 NOTE — Progress Notes (Signed)
Chronic Care Management Pharmacy Note  10/04/2020 Name:  [name] MRN:  [id number] DOB:  1940/10/04  Summary: PharmD initial visit.  All meds reviewed.  Chronic conditions well controlled.  Recommendations/Changes made from today's visit: Due for diabetic foot exam  Plan: FU 6 months   Subjective: [name] is an 80 y.o. year old female who is a primary patient of Hunter, Brayton Mars, MD.  The CCM team was consulted for assistance with disease management and care coordination needs.    Engaged with patient by telephone for initial visit in response to provider referral for pharmacy case management and/or care coordination services.   Consent to Services:  The patient was given the following information about Chronic Care Management services today, agreed to services, and gave verbal consent: 1. CCM service includes personalized support from designated clinical staff supervised by the primary care provider, including individualized plan of care and coordination with other care providers 2. 24/7 contact phone numbers for assistance for urgent and routine care needs. 3. Service will only be billed when office clinical staff spend 20 minutes or more in a month to coordinate care. 4. Only one practitioner may furnish and bill the service in a calendar month. 5.The patient may stop CCM services at any time (effective at the end of the month) by phone call to the office staff. 6. The patient will be responsible for cost sharing (co-pay) of up to 20% of the service fee (after annual deductible is met). Patient agreed to services and consent obtained.  Patient Care Team: Marin Olp, MD as PCP - General (Family Medicine) Belva Crome, MD as PCP - Cardiology (Cardiology) Clarene Essex, MD as Consulting Physician (Gastroenterology) Altheimer, Legrand Como, MD as Referring Physician (Endocrinology) Luberta Mutter, MD as Consulting Physician (Ophthalmology) Gaynelle Arabian, MD  as Consulting Physician (Orthopedic Surgery) Johnathan Hausen, MD as Consulting Physician (General Surgery) Raynelle Bring, MD as Consulting Physician (Urology) Rozetta Nunnery, MD as Consulting Physician (Otolaryngology) Gaynelle Arabian, MD as Consulting Physician (Orthopedic Surgery) Edythe Clarity, [facility] as Pharmacist (Pharmacist)  Recent office visits:  03/31/20 Video Visit, Allwardt, Alyssa PA-C, Acute Visit. No medication changes, No follow ups on file. 07/03/20 Office Visit, Billey Chang MD, Acute Visit Started Amoxicillin 875 mg 2 times daily for 5 days for Acute Cystitis. Follow up as needed. 07/11/20 Office Visit, Garret Reddish MD, Started RX Hydrocodone-Acetaminophen 5-325 mg take 1-2 tablets by mouth every 6 hours as needed. Follow up in 4 months. 07/11/20 Patient Message, Garret Reddish MD, refilled Desvenlafaxine Suc ER 100 mg. No follow ups on file    Recent consult visits:  None in the last 6 months   Hospital visits:  09/09/20 [facility], Blanchie Serve MD Acute Cystitis, Started Cephalexin 500 mg 2 times daily for 5 days, Started Phenazopyridine HCI 200 mg 3 times daily for 2 days. Discontinued Hydrocodone 5-325 mg 1-2 tablets every 6 hours as needed and Prednisone 58m. No follows up on file . 09/15/20 [facility] seen for UTI Started Ciprofloxacin HCI 500 mg every 12 hours. NO follow ups on file      Objective:  Lab Results  Component Value Date   CREATININE 0.92 07/11/2020   BUN 15 07/11/2020   GFR 58.88 (L) 07/11/2020   GFRNONAA 64 10/19/2019   GFRAA 74 10/19/2019   NA 135 07/11/2020   K 3.8 07/11/2020   CALCIUM 10.6 (H) 07/11/2020   CO2 29 07/11/2020   GLUCOSE 125 (H)  07/11/2020    Lab Results  Component Value Date/Time   HGBA1C 6.8 (H) 07/11/2020 10:35 AM   HGBA1C 6.5 03/09/2020 12:20 PM   GFR 58.88 (L) 07/11/2020 10:35 AM   GFR 57.52 (L) 03/09/2020 12:20 PM   MICROALBUR 2.1 (H) 07/11/2020 10:35 AM   MICROALBUR <0.7  05/31/2019 01:55 PM    Last diabetic Eye exam:  Lab Results  Component Value Date/Time   HMDIABEYEEXA No Retinopathy 12/24/2019 02:36 PM    Last diabetic Foot exam:  Lab Results  Component Value Date/Time   HMDIABFOOTEX done 09/19/2009 12:00 AM     Lab Results  Component Value Date   CHOL 150 08/30/2019   HDL 82 08/30/2019   LDLCALC 49 08/30/2019   LDLDIRECT 60.0 05/31/2019   TRIG 96 08/30/2019   CHOLHDL 1.8 08/30/2019    Hepatic Function Latest Ref Rng & Units 07/11/2020 05/30/2020 03/09/2020  Total Protein 6.0 - 8.3 g/dL 7.1 - 6.7  Albumin 3.5 - 5.2 g/dL 4.3 4.6 4.0  AST 0 - 37 U/L _0 ALT 0 - 35 U/L _1 Alk Phosphatase 39 - 117 U/L 112 160(A) 110  Total Bilirubin 0.2 - 1.2 mg/dL 0.6 - 0.5  Bilirubin, Direct 0.0 - 0.3 mg/dL - - -    Lab Results  Component Value Date/Time   TSH 3.02 01/01/2019 12:00 AM   TSH 0.10 (L) 07/30/2011 10:46 AM   TSH 0.07 (L) 02/25/2011 10:03 AM   FREET4 0.81 02/25/2011 10:03 AM   FREET4 0.71 06/06/2010 11:56 AM    CBC Latest Ref Rng & Units 05/30/2020 10/19/2019 08/29/2019  WBC - 10.9 10.1 10.7(H)  Hemoglobin 12.0 - 16.0 14.1 14.2 12.7  Hematocrit 36 - 46 42 42.8 41.1  Platelets 150 - 399 227 227 251    Lab Results  Component Value Date/Time   VD25OH 44.68 07/11/2020 10:35 AM   VD25OH 43.94 12/30/2014 09:57 AM    Clinical ASCVD: No  The ASCVD Risk score (Arnett DK, et al., 2019) failed to calculate for the following reasons:   The 2019 ASCVD risk score is only valid for ages 65 to 54    Depression screen PHQ 2/9 07/03/2020 07/03/2020 03/09/2020  Decreased Interest 0 0 3  Down, Depressed, Hopeless 0 0 0  PHQ - 2 Score 0 0 3  Altered sleeping 0 - 3  Tired, decreased energy 0 - 3  Change in appetite 0 - 3  Feeling bad or failure about yourself  0 - 0  Trouble concentrating 0 - 1  Moving slowly or fidgety/restless 0 - 0  Suicidal thoughts 0 - 0  PHQ-9 Score 0 - 13  Difficult doing work/chores Not difficult at all - Not  difficult at all  Some recent data might be hidden      Social History   Tobacco Use  Smoking Status Former   Packs/day: 0.70   Years: 20.00   Pack years: 14.00   Types: Cigarettes   Quit date: 03/04/1978   Years since quitting: 42.6  Smokeless Tobacco Never   BP Readings from Last 3 Encounters:  09/15/20 (!) 164/78  09/09/20 124/79  07/11/20 135/80   Pulse Readings from Last 3 Encounters:  09/15/20 66  09/09/20 66  07/11/20 69   Wt Readings from Last 3 Encounters:  09/09/20 215 lb (97.5 kg)  07/11/20 212 lb (96.2 kg)  07/03/20 213 lb 3.2 oz (96.7 kg)   BMI Readings from Last 3 Encounters:  09/09/20 40.62 kg/m  07/11/20 40.06 kg/m  07/03/20 40.28 kg/m    Assessment/Interventions: Review of patient past medical history, allergies, medications, health status, including review of consultants reports, laboratory and other test data, was performed as part of comprehensive evaluation and provision of chronic care management services.   SDOH:  (Social Determinants of Health) assessments and interventions performed: Yes  Financial Resource Strain: Low Risk    Difficulty of Paying Living Expenses: Not very hard    SDOH Screenings   Alcohol Screen: Not on file  Depression (PHQ2-9): Low Risk    PHQ-2 Score: 0  Financial Resource Strain: Low Risk    Difficulty of Paying Living Expenses: Not very hard  Food Insecurity: Not on file  Housing: Not on file  Physical Activity: Not on file  Social Connections: Not on file  Stress: Not on file  Tobacco Use: Medium Risk   Smoking Tobacco Use: Former   Smokeless Tobacco Use: Never  Transportation Needs: Not on file    [place]  Allergies  Allergen Reactions   Flexeril [Cyclobenzaprine] Other (See Comments)    On Pristiq- possible serotonin reaction to flexeril   Ace Inhibitors Other (See Comments)    States after a surgery she was told to "not take" this class of drugs- not 100% sure what the reason was    Codeine Nausea Only and Other (See Comments)    Upset stomach   Singulair [Montelukast] Other (See Comments)    Causes depression   Azithromycin Rash    Medications Reviewed Today     Reviewed by Edythe Clarity, [facility] (Pharmacist) on 10/04/20 at [place] List Status: <None>   Medication Order Taking? Sig Documenting Provider Last Dose Status Informant  acetaminophen (TYLENOL) 500 MG tablet [id number] Yes Take 1,000 mg by mouth every 8 (eight) hours as needed for mild pain or headache. [provider] Taking Active Self  albuterol (VENTOLIN HFA) 108 (90 Base) MCG/ACT inhaler [id number] Yes Inhale 1-2 puffs into the lungs every 4 (four) hours as needed for wheezing or shortness of breath. Marin Olp, MD Taking Active Self  amLODipine (NORVASC) 10 MG tablet [id number] Yes Take 1 tablet (10 mg total) by mouth daily. Marin Olp, MD Taking Active   aspirin EC 81 MG tablet [id number] Yes Take 81 mg by mouth daily. Swallow whole. [provider] Taking Active Self  Cholecalciferol (VITAMIN D-3) 125 MCG (5000 UT) TABS [id number] Yes Take 5,000 Units by mouth daily. [provider] Taking Active Self  cyanocobalamin (,VITAMIN B-12,) 1000 MCG/ML injection [id number] Yes Inject 1 mL (1,000 mcg total) into the muscle every 30 (thirty) days. Uses on amonthly basis  Patient taking differently: Inject 1,000 mcg into the muscle every 30 (thirty) days.   Marin Olp, MD Taking Active   desvenlafaxine (PRISTIQ) 100 MG 24 hr tablet [id number] Yes Take 1 tablet (100 mg total) by mouth daily. Marin Olp, MD Taking Active   diclofenac (VOLTAREN) 75 MG EC tablet [id number] Yes Take 1 tablet (75 mg total) by mouth daily. Marin Olp, MD Taking Active   FeFum-FePo-FA-B Cmp-C-Zn-Mn-Cu [facility] PLUS) 162-115.2-1 MG CAPS [id number] No TAKE ONE CAPSULE BY MOUTH TWICE A DAY  Patient not taking: Reported on 10/03/2020   Marin Olp, MD Not Taking Active    fexofenadine (ALLEGRA) 180 MG tablet [id number] Yes Take 180 mg by mouth daily as needed for allergies or rhinitis. [provider] Taking Active   fluticasone (FLONASE) 50 MCG/ACT nasal spray [id number] Yes  Place 2 sprays into both nostrils daily. Marin Olp, MD Taking Active   glucose blood test strip [id number] Yes Test blood sugar once per day Marin Olp, MD Taking Active   Levothyroxine Sodium 150 MCG CAPS [id number] Yes Take 300 mcg by mouth daily before breakfast. [provider] Taking Active            Med Note Duffy Bruce, Sherrie Mustache Aug 29, 2019  9:39 PM) Patient prefers to take these one hour prior to her breakfast  loperamide (IMODIUM) 2 MG capsule [id number] Yes Take 4 mg by mouth daily as needed for diarrhea or loose stools.  [provider] Taking Active Self  montelukast (SINGULAIR) 10 MG tablet [id number] Yes TAKE 1 TABLET EVERY DAY WITH BREAKFAST Marin Olp, MD Taking Active   nitroGLYCERIN (NITROSTAT) 0.4 MG SL tablet [id number] Yes Place 1 tablet (0.4 mg total) under the tongue every 5 (five) minutes as needed for chest pain. Marin Olp, MD Taking Active Self  Omega-3 Fatty Acids (FISH OIL) 1000 MG CAPS [id number] Yes Take 1,000 mg by mouth daily. [provider] Taking Active   Propylene Glycol (SYSTANE COMPLETE OP) [id number] Yes Place 1 drop into both eyes in the morning. [provider] Taking Active Self  REPATHA [place] 423 MG/ML Darden Palmer [id number] Yes  [provider] Taking Active   TRUEplus Lancets 30G MISC [id number] Yes Check blood sugar once per day Marin Olp, MD Taking Active             Patient Active Problem List   Diagnosis Date Noted   Primary osteoarthritis of right knee 06/28/2019   B12 deficiency 05/31/2019   Chest pain with high risk for cardiac etiology 04/02/2018   Hypercholesteremia    History of polymyalgia rheumatica 03/16/2018   CAD (coronary artery disease)  11/19/2017   Anxiety 07/17/2017   Osteopenia of left hip 07/17/2017   Overactive bladder 09/03/2016   Morbid obesity ([place]) 06/28/2015   Hypercalcemia 12/29/2014   Allergic rhinitis 07/20/2014   History of gastric bypass 02/08/2014   OA (osteoarthritis) of knee 10/05/2013   Hyperlipidemia associated with type 2 diabetes mellitus ([place]) 01/05/2008   CHEST WALL PAIN, ANTERIOR 10/09/2007   Hypothyroidism 09/09/2007   Well controlled type 2 diabetes mellitus ([place]) 07/18/2006   Major depression, recurrent, full remission ([place]) 07/18/2006   Hypertension associated with diabetes ([place]) 07/18/2006   Asthma 07/18/2006    Immunization History  Administered Date(s) Administered   Fluad Quad(high Dose 65+) 09/22/2018, 10/19/2019   Influenza Split 10/22/2010, 11/14/2011   Influenza Whole 11/13/2006, 10/27/2008   Influenza, High Dose Seasonal PF 10/28/2014, 09/28/2015, 10/18/2016, 11/19/2017   Influenza,inj,Quad PF,6+ Mos 10/05/2012, 10/05/2013   Influenza-Unspecified 10/21/2013   PFIZER(Purple Top)SARS-COV-2 Vaccination 02/11/2019, 03/04/2019, 09/04/2019, 03/11/2020   Pneumococcal Conjugate-13 03/01/2013, 10/21/2013   Pneumococcal Polysaccharide-23 05/31/2006   Td 11/24/1998, 03/01/2013   Zoster Recombinat (Shingrix) 10/31/2016, 05/01/2017   Zoster, Live 06/20/2006    Conditions to be addressed/monitored:  HTN, CAD, Type II DM, HLD, Hypothyroidism, Osteopenia  Care Plan : General Pharmacy (Adult)  Updates made by Edythe Clarity, RPH since 10/04/2020 12:00 AM     Problem: HTN, CAD, Type II DM, HLD, Hypothyroidism, Osteopenia   Priority: High  Onset Date: 04/03/2021  Note:   Current Barriers:  Unable to independently monitor therapeutic efficacy  Pharmacist Clinical Goal(s):  Patient will maintain control of glucose and BP as evidenced by labs  through collaboration with PharmD and provider.  Interventions: 1:1 collaboration with Marin Olp, MD regarding development and  update of comprehensive plan of care as evidenced by provider attestation and co-signature Inter-disciplinary care team collaboration (see longitudinal plan of care) Comprehensive medication review performed; medication list updated in electronic medical record  Hypertension (BP goal <130/80) -Controlled -Current treatment: Amlodipine 57m daily -Medications previously tried: olmesartan/HCTZ  -Current home readings: "normal" -Denies hypotensive/hypertensive symptoms -Educated on BP goals and benefits of medications for prevention of heart attack, stroke and kidney damage; Exercise goal of 150 minutes per week; Importance of home blood pressure monitoring; Symptoms of hypotension and importance of maintaining adequate hydration; -Counseled to monitor BP at home weekly, document, and provide log at future appointments -Recommended to continue current medication  Hyperlipidemia: (LDL goal < 70) -Controlled -Current treatment: Repatha 1444mml -Medications previously tried: pravastatin   -Educated on Cholesterol goals;  Importance of limiting foods high in cholesterol; Exercise goal of 150 minutes per week; -Recommended to continue current medication Assessed patient finances. She is in donut hole right now.  Having to fill one month at a time.  Currently she is able to afford this, encouraged her to reach out if this becomes an issue.  [employer] grant is unavailable at this time and she says she will not qualify for Amgen safety net.  Diabetes (A1c goal <7%) -Controlled -Current medications: None -Medications previously tried: none noted  -Current home glucose readings - "normal" -Educated on A1c and blood sugar goals; Benefits of routine self-monitoring of blood sugar; -Counseled to check feet daily and get yearly eye exams -Recommended to continue current medication Keep log of sugars and shoot for < 130 fasting.   Hypothyroidism (Goal: Maintain TSh) -Controlled -Current  treatment  Levothyroxine 15059m2 capsules daily -Medications previously tried: none noted -TSH is WNL, takes med appropriately  -Recommended to continue current medication  Patient Goals/Self-Care Activities Patient will:  - take medications as prescribed focus on medication adherence by pill box check blood pressure periodically, document, and provide at future appointments  Follow Up Plan: The care management team will reach out to the patient again over the next 180 days.      Goal: Patient-Specific Goal        Medication Assistance: None required.  Patient affirms current coverage meets needs.  Compliance/Adherence/Medication fill history: Care Gaps: Needs diabetic foot exam   Patient's [facility]:  [facility] [address]one: [phone]: [phone] ([facility]) - [address]one: [phone]: [phone] [place]one: [phone]: [address] [place]one: [phone]: [id number] [place] [place]one: [phone]: [phone] pill box? Yes Pt endorses 100% compliance  We discussed: Benefits of medication synchronization, packaging and delivery as well as enhanced pharmacist oversight with [employer]. Patient decided to: Continue current medication management strategy  Care Plan and Follow Up Patient Decision:  Patient agrees to Care Plan and  Follow-up.  Plan: The care management team will reach out to  the patient again over the next 180 days.  Beverly Milch, PharmD [phone]

## 2020-10-04 NOTE — Patient Instructions (Addendum)
Visit Information   Goals Addressed             This Visit's Progress    Track and Manage My Blood Pressure-Hypertension       Timeframe:  Long-Range Goal Priority:  High Start Date:  10/04/20                            Expected End Date: 04/03/21                      Follow Up Date 01/03/21    - check blood pressure weekly - choose a place to take my blood pressure (home, clinic or office, retail store) - write blood pressure results in a log or diary    Why is this important?   You won't feel high blood pressure, but it can still hurt your blood vessels.  High blood pressure can cause heart or kidney problems. It can also cause a stroke.  Making lifestyle changes like losing a little weight or eating less salt will help.  Checking your blood pressure at home and at different times of the day can help to control blood pressure.  If the doctor prescribes medicine remember to take it the way the doctor ordered.  Call the office if you cannot afford the medicine or if there are questions about it.     Notes:        Patient Care Plan: General Pharmacy (Adult)     Problem Identified: HTN, CAD, Type II DM, HLD, Hypothyroidism, Osteopenia   Priority: High  Onset Date: 04/03/2021  Note:   Current Barriers:  Unable to independently monitor therapeutic efficacy  Pharmacist Clinical Goal(s):  Patient will maintain control of glucose and BP as evidenced by labs  through collaboration with PharmD and provider.   Interventions: 1:1 collaboration with Marin Olp, MD regarding development and update of comprehensive plan of care as evidenced by provider attestation and co-signature Inter-disciplinary care team collaboration (see longitudinal plan of care) Comprehensive medication review performed; medication list updated in electronic medical record  Hypertension (BP goal <130/80) -Controlled -Current treatment: Amlodipine '10mg'$  daily -Medications previously tried:  olmesartan/HCTZ  -Current home readings: "normal" -Denies hypotensive/hypertensive symptoms -Educated on BP goals and benefits of medications for prevention of heart attack, stroke and kidney damage; Exercise goal of 150 minutes per week; Importance of home blood pressure monitoring; Symptoms of hypotension and importance of maintaining adequate hydration; -Counseled to monitor BP at home weekly, document, and provide log at future appointments -Recommended to continue current medication  Hyperlipidemia: (LDL goal < 70) -Controlled -Current treatment: Repatha '140mg'$ /ml -Medications previously tried: pravastatin   -Educated on Cholesterol goals;  Importance of limiting foods high in cholesterol; Exercise goal of 150 minutes per week; -Recommended to continue current medication Assessed patient finances. She is in donut hole right now.  Having to fill one month at a time.  Currently she is able to afford this, encouraged her to reach out if this becomes an issue.  Healthwell grant is unavailable at this time and she says she will not qualify for Amgen safety net.  Diabetes (A1c goal <7%) -Controlled -Current medications: None -Medications previously tried: none noted  -Current home glucose readings - "normal" -Educated on A1c and blood sugar goals; Benefits of routine self-monitoring of blood sugar; -Counseled to check feet daily and get yearly eye exams -Recommended to continue current medication Keep log of sugars and shoot  for < 130 fasting.   Hypothyroidism (Goal: Maintain TSh) -Controlled -Current treatment  Levothyroxine 135mg 2 capsules daily -Medications previously tried: none noted -TSH is WNL, takes med appropriately  -Recommended to continue current medication  Patient Goals/Self-Care Activities Patient will:  - take medications as prescribed focus on medication adherence by pill box check blood pressure periodically, document, and provide at future  appointments  Follow Up Plan: The care management team will reach out to the patient again over the next 180 days.      Goal: Patient-Specific Goal       Ms. IBuddwas given information about Chronic Care Management services today including:  CCM service includes personalized support from designated clinical staff supervised by her physician, including individualized plan of care and coordination with other care providers 24/7 contact phone numbers for assistance for urgent and routine care needs. Standard insurance, coinsurance, copays and deductibles apply for chronic care management only during months in which we provide at least 20 minutes of these services. Most insurances cover these services at 100%, however patients may be responsible for any copay, coinsurance and/or deductible if applicable. This service may help you avoid the need for more expensive face-to-face services. Only one practitioner may furnish and bill the service in a calendar month. The patient may stop CCM services at any time (effective at the end of the month) by phone call to the office staff.  Patient agreed to services and verbal consent obtained.   The patient verbalized understanding of instructions, educational materials, and care plan provided today and agreed to receive a mailed copy of patient instructions, educational materials, and care plan.  Telephone follow up appointment with pharmacy team member scheduled for: 6 months  CEdythe Clarity [place]

## 2020-10-09 ENCOUNTER — Telehealth: Payer: Self-pay | Admitting: Pharmacist

## 2020-10-09 NOTE — Progress Notes (Signed)
    Chronic Care Management Pharmacy Assistant   Name: [name]  MRN: [id number] DOB: February 22, 1940  Reason for Encounter: CCM Care Plan  Medications: Outpatient Encounter Medications as of 10/09/2020  Medication Sig Note   acetaminophen (TYLENOL) 500 MG tablet Take 1,000 mg by mouth every 8 (eight) hours as needed for mild pain or headache.    albuterol (VENTOLIN HFA) 108 (90 Base) MCG/ACT inhaler Inhale 1-2 puffs into the lungs every 4 (four) hours as needed for wheezing or shortness of breath.    amLODipine (NORVASC) 10 MG tablet Take 1 tablet (10 mg total) by mouth daily.    aspirin EC 81 MG tablet Take 81 mg by mouth daily. Swallow whole.    Cholecalciferol (VITAMIN D-3) 125 MCG (5000 UT) TABS Take 5,000 Units by mouth daily.    cyanocobalamin (,VITAMIN B-12,) 1000 MCG/ML injection Inject 1 mL (1,000 mcg total) into the muscle every 30 (thirty) days. Uses on amonthly basis (Patient taking differently: Inject 1,000 mcg into the muscle every 30 (thirty) days.)    desvenlafaxine (PRISTIQ) 100 MG 24 hr tablet Take 1 tablet (100 mg total) by mouth daily.    diclofenac (VOLTAREN) 75 MG EC tablet Take 1 tablet (75 mg total) by mouth daily.    FeFum-FePo-FA-B Cmp-C-Zn-Mn-Cu (SE-TAN PLUS) 162-115.2-1 MG CAPS TAKE ONE CAPSULE BY MOUTH TWICE A DAY (Patient not taking: Reported on 10/03/2020)    fexofenadine (ALLEGRA) 180 MG tablet Take 180 mg by mouth daily as needed for allergies or rhinitis.    fluticasone (FLONASE) 50 MCG/ACT nasal spray Place 2 sprays into both nostrils daily.    glucose blood test strip Test blood sugar once per day    Levothyroxine Sodium 150 MCG CAPS Take 300 mcg by mouth daily before breakfast. 08/29/2019: Patient prefers to take these one hour prior to her breakfast   loperamide (IMODIUM) 2 MG capsule Take 4 mg by mouth daily as needed for diarrhea or loose stools.     montelukast (SINGULAIR) 10 MG tablet TAKE 1 TABLET EVERY DAY WITH BREAKFAST    nitroGLYCERIN  (NITROSTAT) 0.4 MG SL tablet Place 1 tablet (0.4 mg total) under the tongue every 5 (five) minutes as needed for chest pain.    Omega-3 Fatty Acids (FISH OIL) 1000 MG CAPS Take 1,000 mg by mouth daily.    Propylene Glycol (SYSTANE COMPLETE OP) Place 1 drop into both eyes in the morning.    REPATHA SURECLICK [id number] MG/ML SOAJ     TRUEplus Lancets 30G MISC Check blood sugar once per day    No facility-administered encounter medications on file as of 10/09/2020.   Reviewed the patients initial visit reinsured it was completed per the pharmacist Leata Mouse request. Printed the CCM care plan. Mailed the patient CCM care plan to their most recent address on file.  Follow-Up:Pharmacist Review  Charlann Lange, [place] Pharmacist Assistant [phone]

## 2020-10-16 DIAGNOSIS — M130 Polyarthritis, unspecified: Secondary | ICD-10-CM | POA: Diagnosis not present

## 2020-10-16 DIAGNOSIS — M353 Polymyalgia rheumatica: Secondary | ICD-10-CM | POA: Diagnosis not present

## 2020-10-16 DIAGNOSIS — R5383 Other fatigue: Secondary | ICD-10-CM | POA: Diagnosis not present

## 2020-10-16 DIAGNOSIS — Z7952 Long term (current) use of systemic steroids: Secondary | ICD-10-CM | POA: Diagnosis not present

## 2020-10-16 DIAGNOSIS — M7989 Other specified soft tissue disorders: Secondary | ICD-10-CM | POA: Diagnosis not present

## 2020-10-16 DIAGNOSIS — Z6841 Body Mass Index (BMI) 40.0 and over, adult: Secondary | ICD-10-CM | POA: Diagnosis not present

## 2020-10-16 DIAGNOSIS — M5136 Other intervertebral disc degeneration, lumbar region: Secondary | ICD-10-CM | POA: Diagnosis not present

## 2020-10-16 DIAGNOSIS — M150 Primary generalized (osteo)arthritis: Secondary | ICD-10-CM | POA: Diagnosis not present

## 2020-10-17 DIAGNOSIS — E559 Vitamin D deficiency, unspecified: Secondary | ICD-10-CM | POA: Diagnosis not present

## 2020-10-17 DIAGNOSIS — E063 Autoimmune thyroiditis: Secondary | ICD-10-CM | POA: Diagnosis not present

## 2020-10-17 DIAGNOSIS — E210 Primary hyperparathyroidism: Secondary | ICD-10-CM | POA: Diagnosis not present

## 2020-10-17 DIAGNOSIS — M8580 Other specified disorders of bone density and structure, unspecified site: Secondary | ICD-10-CM | POA: Diagnosis not present

## 2020-10-17 DIAGNOSIS — M810 Age-related osteoporosis without current pathological fracture: Secondary | ICD-10-CM | POA: Diagnosis not present

## 2020-10-17 DIAGNOSIS — E038 Other specified hypothyroidism: Secondary | ICD-10-CM | POA: Diagnosis not present

## 2020-10-17 DIAGNOSIS — Z6841 Body Mass Index (BMI) 40.0 and over, adult: Secondary | ICD-10-CM | POA: Diagnosis not present

## 2020-10-19 ENCOUNTER — Other Ambulatory Visit: Payer: Self-pay

## 2020-10-19 ENCOUNTER — Encounter: Payer: Self-pay | Admitting: Family Medicine

## 2020-10-19 DIAGNOSIS — Z780 Asymptomatic menopausal state: Secondary | ICD-10-CM

## 2020-10-20 DIAGNOSIS — E7800 Pure hypercholesterolemia, unspecified: Secondary | ICD-10-CM

## 2020-10-20 DIAGNOSIS — E1159 Type 2 diabetes mellitus with other circulatory complications: Secondary | ICD-10-CM

## 2020-10-20 DIAGNOSIS — I152 Hypertension secondary to endocrine disorders: Secondary | ICD-10-CM

## 2020-10-20 DIAGNOSIS — E039 Hypothyroidism, unspecified: Secondary | ICD-10-CM | POA: Diagnosis not present

## 2020-10-20 DIAGNOSIS — E119 Type 2 diabetes mellitus without complications: Secondary | ICD-10-CM | POA: Diagnosis not present

## 2020-10-21 IMAGING — DX DG CHEST 2V
2 series · 2 of 2 positions shown · non-contrast
Comparison: 03/01/2019

CLINICAL DATA: Chest pain

EXAM:
CHEST - 2 VIEW

[chest pa]
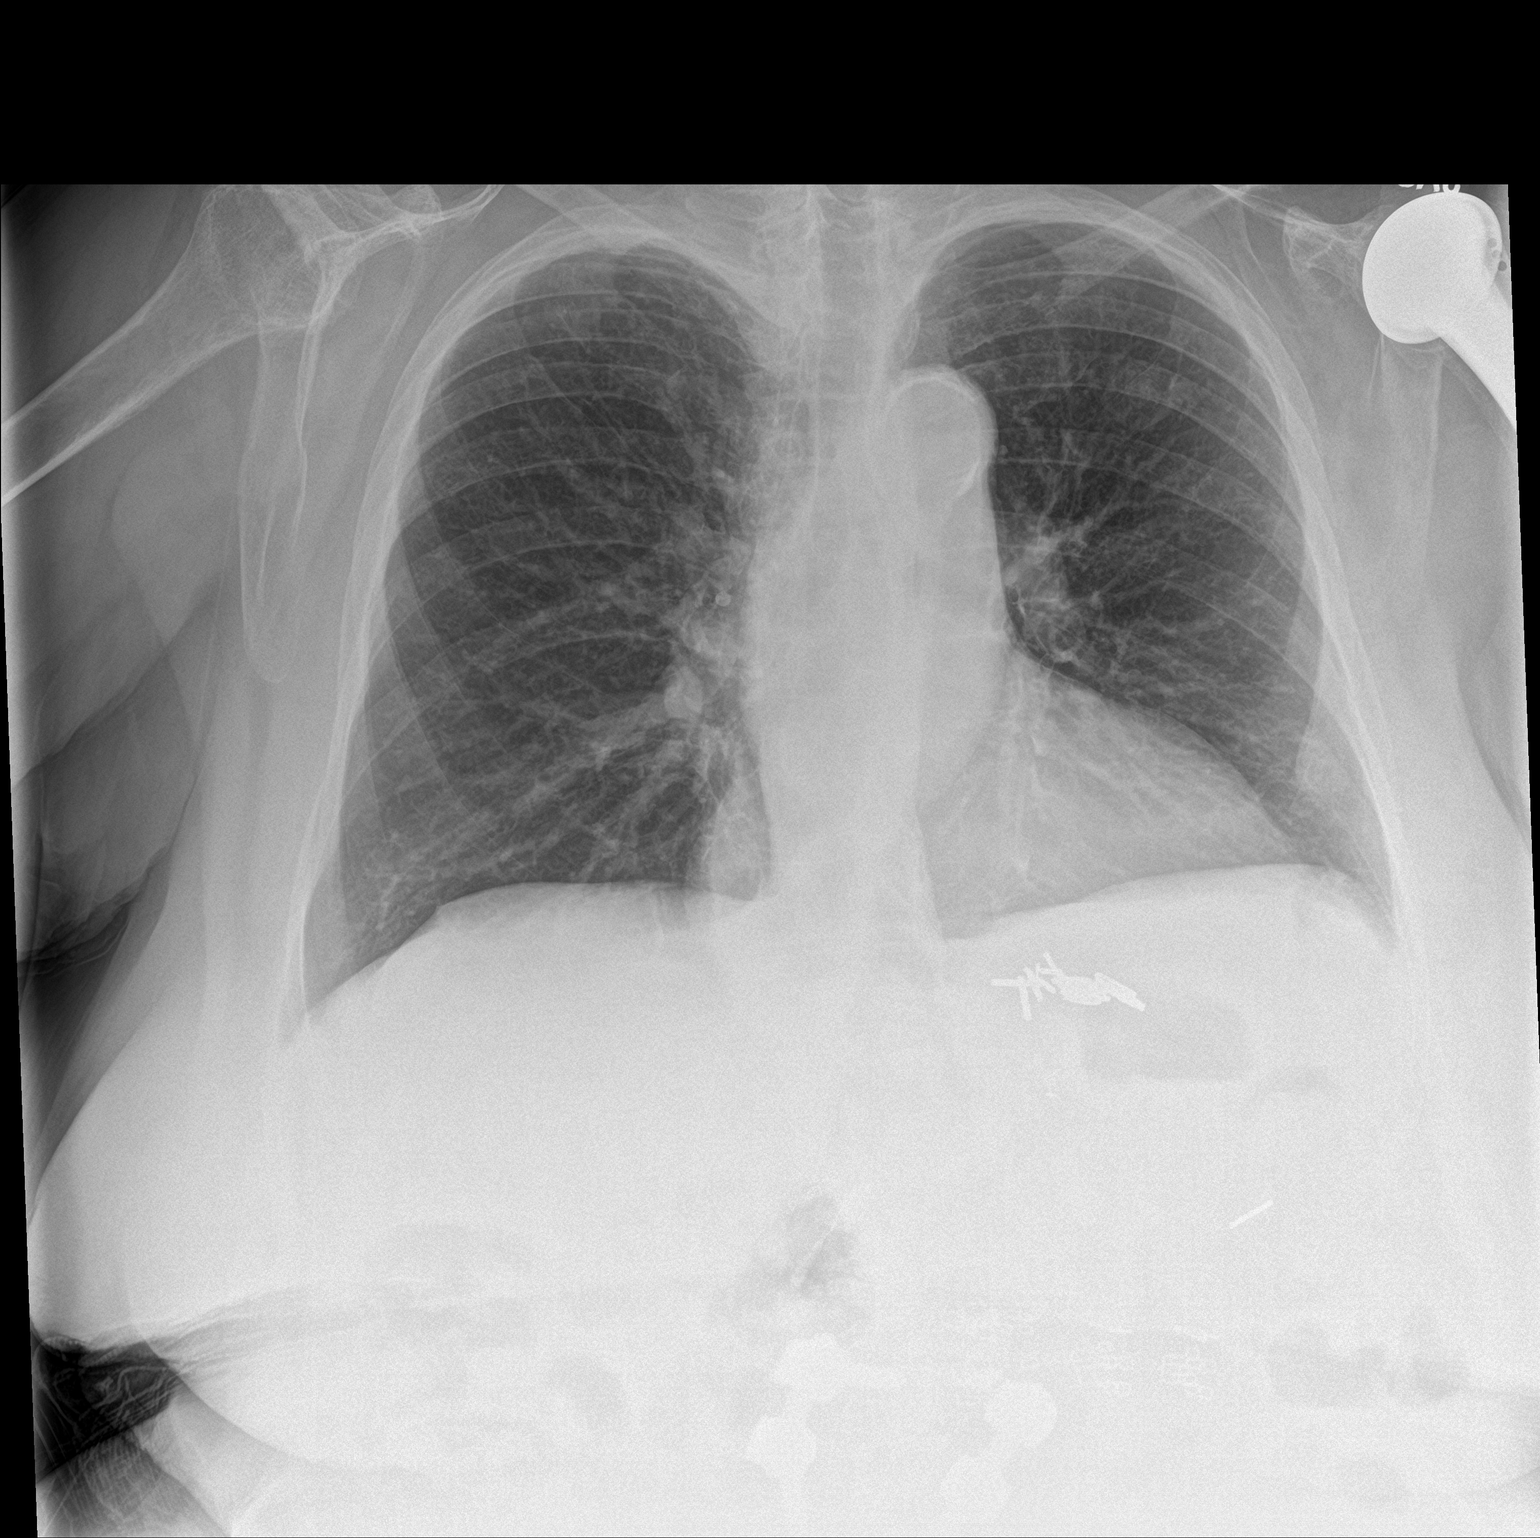

[chest lat]
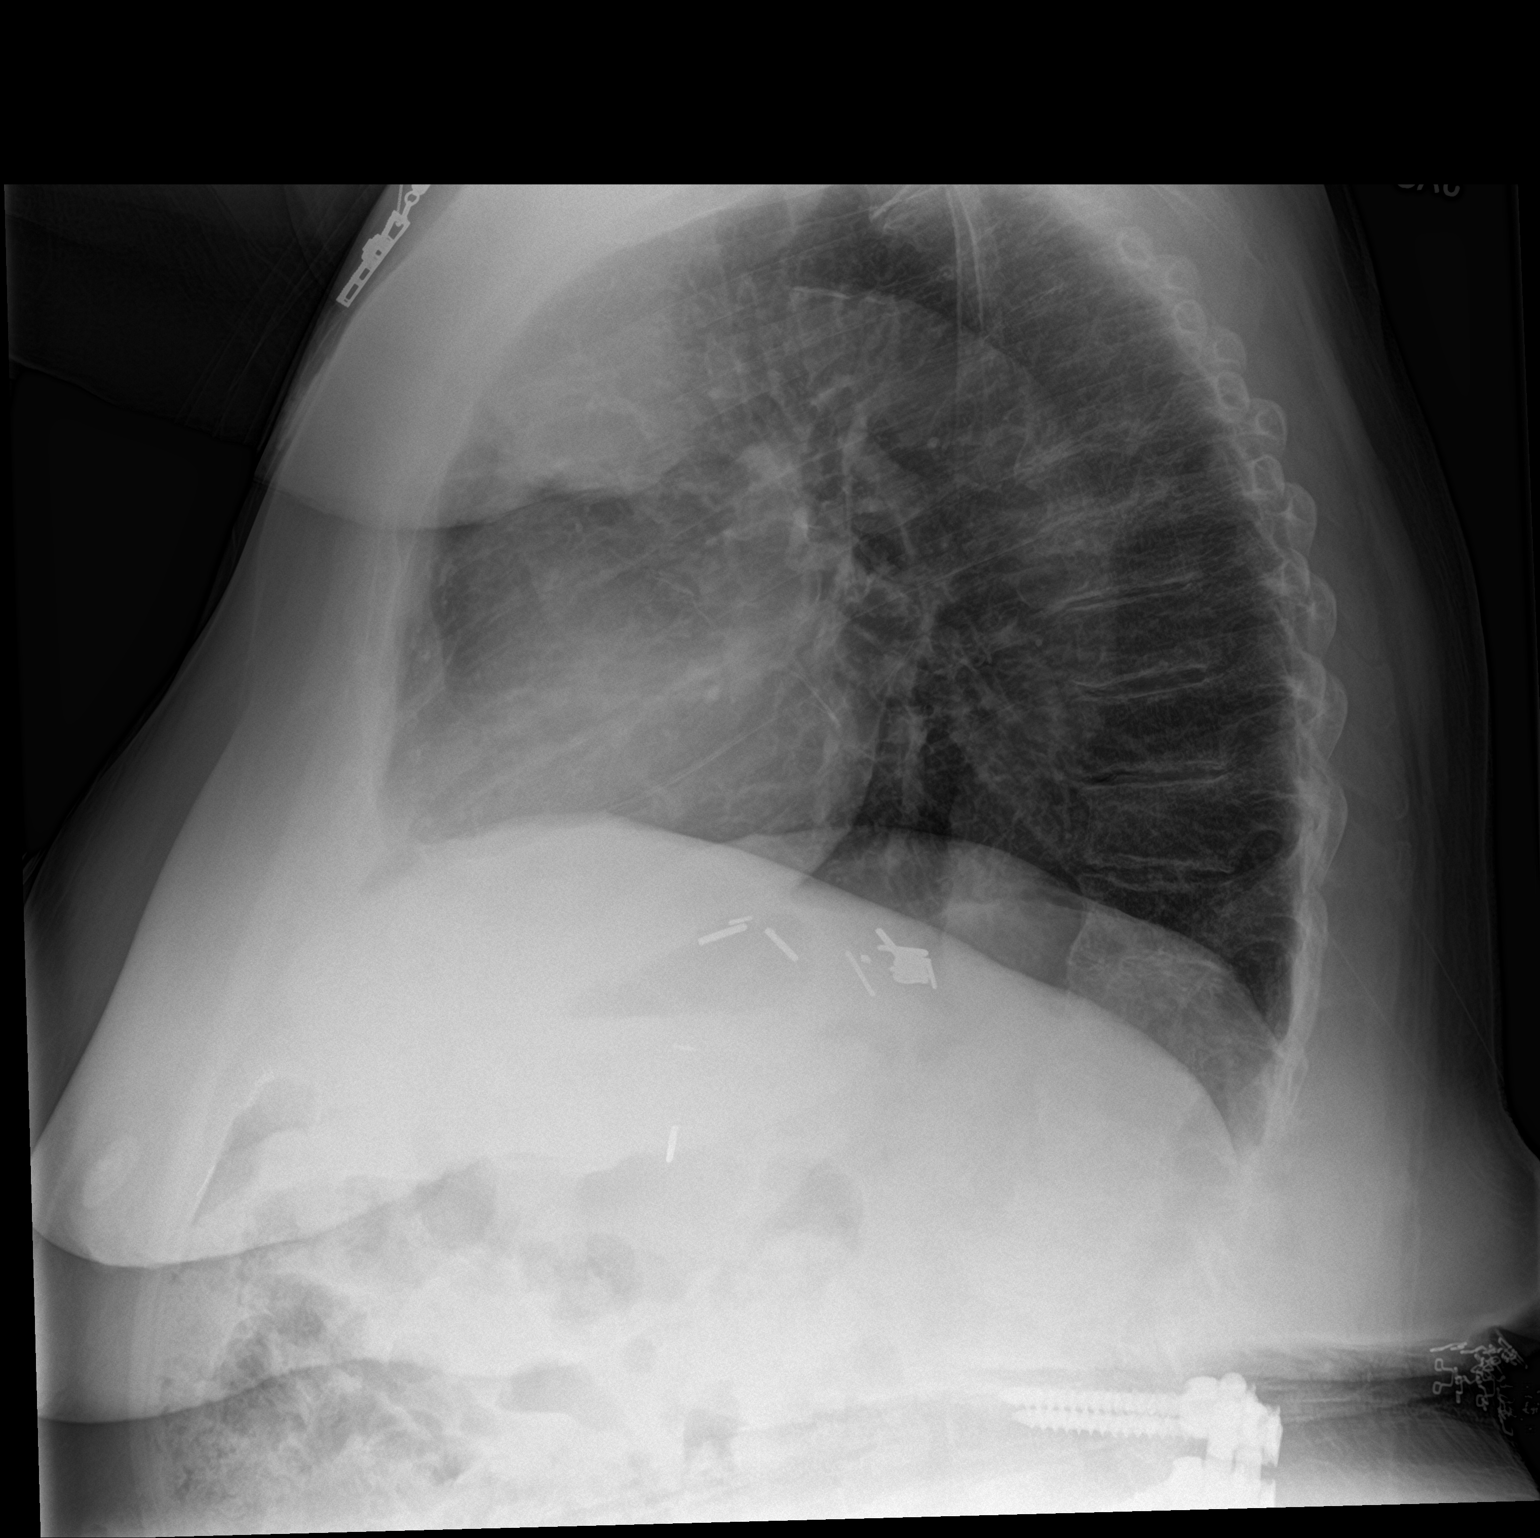

[2 of 2 positions shown; findings below may reference images not displayed]

FINDINGS: Aortic atherosclerosis. Heart is upper limits normal in size. No
confluent opacities, effusions or edema. No acute bony abnormality.
IMPRESSION: No active cardiopulmonary disease.

## 2020-10-24 ENCOUNTER — Ambulatory Visit (INDEPENDENT_AMBULATORY_CARE_PROVIDER_SITE_OTHER)
Admission: RE | Admit: 2020-10-24 | Discharge: 2020-10-24 | Disposition: A | Discharge status: Home / Self Care | Payer: Medicare HMO | Source: Ambulatory Visit | Attending: Family Medicine | Admitting: Family Medicine

## 2020-10-24 ENCOUNTER — Other Ambulatory Visit: Payer: Self-pay

## 2020-10-24 DIAGNOSIS — Z780 Asymptomatic menopausal state: Secondary | ICD-10-CM | POA: Diagnosis not present

## 2020-10-26 ENCOUNTER — Ambulatory Visit: Payer: Medicare HMO | Admitting: Physician Assistant

## 2020-10-26 ENCOUNTER — Other Ambulatory Visit: Payer: Self-pay

## 2020-10-26 ENCOUNTER — Encounter: Payer: Self-pay | Admitting: Physician Assistant

## 2020-10-26 VITALS — BP 150/86 | HR 68 | Ht 61.0 in | Wt 229.6 lb

## 2020-10-26 DIAGNOSIS — E785 Hyperlipidemia, unspecified: Secondary | ICD-10-CM

## 2020-10-26 DIAGNOSIS — I25118 Atherosclerotic heart disease of native coronary artery with other forms of angina pectoris: Secondary | ICD-10-CM | POA: Diagnosis not present

## 2020-10-26 DIAGNOSIS — R5383 Other fatigue: Secondary | ICD-10-CM | POA: Diagnosis not present

## 2020-10-26 DIAGNOSIS — I10 Essential (primary) hypertension: Secondary | ICD-10-CM | POA: Diagnosis not present

## 2020-10-26 DIAGNOSIS — R011 Cardiac murmur, unspecified: Secondary | ICD-10-CM | POA: Diagnosis not present

## 2020-10-26 NOTE — Patient Instructions (Addendum)
Medication Instructions:  Your physician recommends that you continue on your current medications as directed. Please refer to the Current Medication list given to you today.  *If you need a refill on your cardiac medications before your next appointment, please call your pharmacy*   Lab Work: None ordered  If you have labs (blood work) drawn today and your tests are completely normal, you will receive your results only by: [place] (if you have MyChart) OR A paper copy in the mail If you have any lab test that is abnormal or we need to change your treatment, we will call you to review the results.   Testing/Procedures: Your physician has requested that you have an echocardiogram. Echocardiography is a painless test that uses sound waves to create images of your heart. It provides your doctor with information about the size and shape of your heart and how well your heart's chambers and valves are working. This procedure takes approximately one hour. There are no restrictions for this procedure.    Follow-Up: At [facility], you and your health needs are our priority.  As part of our continuing mission to provide you with exceptional heart care, we have created designated Provider Care Teams.  These Care Teams include your primary Cardiologist (physician) and Advanced Practice Providers (APPs -  Physician Assistants and Nurse Practitioners) who all work together to provide you with the care you need, when you need it.  We recommend signing up for the patient portal called "MyChart".  Sign up information is provided on this After Visit Summary.  MyChart is used to connect with patients for Virtual Visits (Telemedicine).  Patients are able to view lab/test results, encounter notes, upcoming appointments, etc.  Non-urgent messages can be sent to your provider as well.   To learn more about what you can do with MyChart, go to [email or web address].    Your next appointment:   12  month(s)  The format for your next appointment:   In Person  Provider:   You may see Sinclair Grooms, MD or one of the following Advanced Practice Providers on your designated Care Team:   Cecilie Kicks, NP    Other Instructions

## 2020-10-26 NOTE — Progress Notes (Signed)
Cardiology Office Note:    Date:  10/26/2020   ID:  [name], DOB 1940/02/01, MRN [id number]  PCP:  Marin Olp, MD  [facility] Cardiologist:  Sinclair Grooms, MD  [place] Electrophysiologist:  None   Chief Complaint: Yearly follow up   History of Present Illness:    [name] is a 80 y.o. female with a hx of moderate CAD by cath 03/2018, gastric bypass surgery, diet controlled DM, HTN, HLD, hypothyroidism and polymalgia rheumatica seen for follow up.  Admitted 08/2019 for chest pain which felt atypical.  Follow-up Lexiscan Myoview was low risk.  Here today for follow up.  Patient recently had abnormal thyroid function test and parathyroid function test.  This is managed by endocrinologist at [place].  She is in discussion with provider if needs surgery or not.  She denies chest pain, dizziness, palpitation, orthopnea, PND, syncope or melena.  She has chronic shortness of breath with activity.  Recently fatigue and tiredness.  Past Medical History:  Diagnosis Date   Anemia    Arthritis    osteoarthritis - shoulder, knees & hips   Asthma    Coronary artery disease    Depression    Diabetes mellitus without complication (HCC)    no meds   Hypertension    Dr. Orinda Kenner manages BP, pt. reports MD has not found a need for treatment    Hypothyroidism    Sleep difficulties    had sleep study -2009, prior to gastric surgery, told that there was not a need for f/u   Thyroid disease    Vitamin B 12 deficiency     Past Surgical History:  Procedure Laterality Date   ABDOMINAL HYSTERECTOMY     BARIATRIC [place]     [facility]- 30 yrs. ago   CATARACT EXTRACTION, BILATERAL     late 2018   LEFT HEART CATH AND CORONARY ANGIOGRAPHY N/A 04/02/2018   Procedure: LEFT HEART CATH AND CORONARY ANGIOGRAPHY;  Surgeon: Martinique, Peter M, MD;  Location: [facility];  Service: Cardiovascular;  Laterality: N/A;   OOPHORECTOMY      pantallor arthrodesis with rod placement left foot     TONSILLECTOMY     TOTAL KNEE ARTHROPLASTY Left 11/30/2018   Procedure: TOTAL KNEE ARTHROPLASTY;  Surgeon: Gaynelle Arabian, MD;  Location: [facility];  Service: Orthopedics;  Laterality: Left;   TOTAL KNEE ARTHROPLASTY Right 06/28/2019   Procedure: TOTAL KNEE ARTHROPLASTY;  Surgeon: Gaynelle Arabian, MD;  Location: [facility];  Service: Orthopedics;  Laterality: Right;  40min   TOTAL SHOULDER ARTHROPLASTY Left 03/12/2012   Procedure: TOTAL SHOULDER ARTHROPLASTY;  Surgeon: Marin Shutter, MD;  Location: [facility];  Service: Orthopedics;  Laterality: Left;    Current Medications: Current Meds  Medication Sig   acetaminophen (TYLENOL) 500 MG tablet Take 1,000 mg by mouth every 8 (eight) hours as needed for mild pain or headache.   albuterol (VENTOLIN HFA) 108 (90 Base) MCG/ACT inhaler Inhale 1-2 puffs into the lungs every 4 (four) hours as needed for wheezing or shortness of breath.   amLODipine (NORVASC) 10 MG tablet Take 1 tablet (10 mg total) by mouth daily.   aspirin EC 81 MG tablet Take 81 mg by mouth daily. Swallow whole.   Biotin 5000 MCG CAPS    Cholecalciferol (VITAMIN D-3) 125 MCG (5000 UT) TABS Take 5,000 Units by mouth daily.   cyanocobalamin (,VITAMIN B-12,) 1000 MCG/ML injection Inject 1  mL (1,000 mcg total) into the muscle every 30 (thirty) days. Uses on amonthly basis (Patient taking differently: Inject 1,000 mcg into the muscle every 30 (thirty) days.)   desvenlafaxine (PRISTIQ) 100 MG 24 hr tablet Take 1 tablet (100 mg total) by mouth daily.   diclofenac (VOLTAREN) 75 MG EC tablet Take 1 tablet (75 mg total) by mouth daily.   FeFum-FePo-FA-B Cmp-C-Zn-Mn-Cu (SE-TAN PLUS) 162-115.2-1 MG CAPS TAKE ONE CAPSULE BY MOUTH TWICE A DAY   fexofenadine (ALLEGRA) 180 MG tablet Take 180 mg by mouth daily as needed for allergies or rhinitis.   fluticasone (FLONASE) 50 MCG/ACT nasal spray Place 2 sprays into both nostrils daily.   folic acid (FOLVITE) 1 MG  tablet Taking 1 tablet once a day   glucose blood test strip Test blood sugar once per day   Levothyroxine Sodium 150 MCG CAPS Take 300 mcg by mouth daily before breakfast.   loperamide (IMODIUM) 2 MG capsule Take 4 mg by mouth daily as needed for diarrhea or loose stools.    montelukast (SINGULAIR) 10 MG tablet TAKE 1 TABLET EVERY DAY WITH BREAKFAST   nitroGLYCERIN (NITROSTAT) 0.4 MG SL tablet Place 1 tablet (0.4 mg total) under the tongue every 5 (five) minutes as needed for chest pain.   Omega-3 Fatty Acids (FISH OIL) 1000 MG CAPS Take 1,000 mg by mouth daily.   predniSONE (DELTASONE) 5 MG tablet Take by mouth.   Propylene Glycol (SYSTANE COMPLETE OP) Place 1 drop into both eyes in the morning.   REPATHA SURECLICK 423 MG/ML SOAJ    TRUEplus Lancets 30G MISC Check blood sugar once per day   Zinc 50 MG TABS      Allergies:   Flexeril [cyclobenzaprine], Ace inhibitors, Codeine, Singulair [montelukast], and Azithromycin   Social History   Socioeconomic History   Marital status: Widowed    Spouse name: Not on file   Number of children: Not on file   Years of education: Not on file   Highest education level: Not on file  Occupational History   Occupation: retired Optician, dispensing: RETIRED  Tobacco Use   Smoking status: Former    Packs/day: 0.70    Years: 20.00    Pack years: 14.00    Types: Cigarettes    Quit date: 03/04/1978    Years since quitting: 42.6   Smokeless tobacco: Never  Vaping Use   Vaping Use: Never used  Substance and Sexual Activity   Alcohol use: Yes    Alcohol/week: 2.0 standard drinks    Types: 2 Glasses of wine per week    Comment: with dinner  or social rare -    Drug use: No   Sexual activity: Not Currently  Other Topics Concern   Not on file  Social History Narrative   Widowed 2013. 2 kids. 4 grandkids. Oldest daughter lives in [place] and has 2 grandkids that are now in Scientist, physiological, PT school at [employer].       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 There is no history of Breast cancer.    ROS:   Please see the history of present illness.    All other systems reviewed and are negative.   EKGs/Labs/Other Studies Reviewed:    The following studies were reviewed today:   Stress test 08/2019 There was no ST segment deviation noted during stress. This is a low risk study. The left ventricular ejection fraction is normal (55-65%).   There is mild apical thinning in both stress and rest images, but there is no evidence of ischemia. Normal EF and wall motion. Low risk study.    LEFT HEART CATH AND CORONARY ANGIOGRAPHY  03/2018   Conclusion    Mid RCA lesion is 70% stenosed. Dist Cx-1 lesion is 65% stenosed. Dist Cx-2 lesion is 70% stenosed. 1st Diag lesion is 75% stenosed. The left ventricular systolic function is normal. LV end diastolic pressure is normal. The left ventricular ejection fraction is 55-65% by visual estimate.   1. Modest CAD involving a small first diagonal, distal LCx and nondominant RCA 2. Normal LV function 3. Normal LVEDP   Plan: continue medical management. Patient is a candidate for DC later today. She is cleared for knee surgery next week. Recommend follow up with Dr. Daneen Schick as outpatient.     EKG:  EKG is  ordered today.  The ekg ordered today demonstrates normal sinus rhythm  Recent Labs: 05/30/2020: Hemoglobin 14.1; Platelets 227 07/11/2020: ALT 24; BUN 15; Creatinine, Ser 0.92;  Potassium 3.8; Sodium 135  Recent Lipid Panel    Component Value Date/Time   CHOL 150 08/30/2019 0624   CHOL 114 01/11/2019 1012   TRIG 96 08/30/2019 0624   HDL 82 08/30/2019 0624   HDL 53 01/11/2019 1012   CHOLHDL 1.8 08/30/2019 0624   VLDL 19 08/30/2019 0624   LDLCALC 49 08/30/2019 0624   LDLCALC 34 01/11/2019 1012   LDLDIRECT 60.0 05/31/2019 1355    Physical Exam:    VS:  BP (!) 150/86   Pulse 68   Ht 5\' 1"  (1.549 m)   Wt 229 lb 9.6 oz (104.1 kg)   SpO2 95%   BMI 43.38 kg/m     Wt Readings from Last 3 Encounters:  10/26/20 229 lb 9.6 oz (104.1 kg)  09/09/20 215 lb (97.5 kg)  07/11/20 212 lb (96.2 kg)     GEN:  Well nourished, well developed in no acute distress HEENT: Normal NECK: No JVD; No carotid bruits LYMPHATICS: No lymphadenopathy CARDIAC: RRR, systolic murmurs, rubs, gallops RESPIRATORY:  Clear to auscultation without rales, wheezing or rhonchi  ABDOMEN: Soft, non-tender, non-distended MUSCULOSKELETAL:  No edema; No deformity  SKIN: Warm and dry NEUROLOGIC:  Alert and oriented x 3 PSYCHIATRIC:  Normal affect   ASSESSMENT AND PLAN:    CAD Fatigue Shortness of breath with exertion Hypothyroidism No chest pain.  Her dyspnea on exertion seems multifactorial due to morbid obesity and lack of exercise.  She also has abnormal thyroid and parathyroid function recently.  No medication adjustment needed.  Her endocrinologist suggested surgery but patient is unsure.  I have recommended to correct thyroid and parathyroid function first. No change in medical therapy.  5. HTN -Blood pressure elevated.  She reports blood pressure of [id number] systolically. -I have advised to keep log of blood pressure.  She will review with PCP during office visit in 2 weeks. -Continue amlodipine 10 mg daily  6.  HLD -Continue Repetha  7.  Systolic murmur -Get echocardiogram to rule out valvular etiology causing to have dyspnea on exertion.   Medication Adjustments/Labs and  Tests Ordered: Current medicines are reviewed at length with the patient today.  Concerns regarding medicines are outlined above.  Orders Placed This Encounter  Procedures   EKG 12-Lead   ECHOCARDIOGRAM COMPLETE   No orders of the defined types were placed in this encounter.   Patient Instructions  Medication Instructions:  Your physician recommends that you continue on your current medications as directed. Please refer to the Current Medication list given to you today.  *If you need a refill on your cardiac medications before your next appointment, please call your pharmacy*   Lab Work: None ordered  If you have labs (blood work) drawn today and your tests are completely normal, you will receive your results only by: [place] (if you have MyChart) OR A paper copy in the mail If you have any lab test that is abnormal or we need to change your treatment, we will call you to review the results.   Testing/Procedures: Your physician has requested that you have an echocardiogram. Echocardiography is a painless test that uses sound waves to create images of your heart. It provides your doctor with information about the size and shape of your heart and how well your heart's chambers and valves are working. This procedure takes approximately one hour. There are no restrictions for this procedure.    Follow-Up: At [facility], you and your health needs are our priority.  As part of our continuing mission to provide you with exceptional heart care, we have created designated Provider Care Teams.  These Care Teams include your primary Cardiologist (physician) and Advanced Practice Providers (APPs -  Physician Assistants and Nurse Practitioners) who all work together to provide you with the care you need, when you need it.  We recommend signing up for the patient portal called "MyChart".  Sign up information is provided on this After Visit Summary.  MyChart is used to connect with  patients for Virtual Visits (Telemedicine).  Patients are able to view lab/test results, encounter notes, upcoming appointments, etc.  Non-urgent messages can be sent to your provider as well.   To learn more about what you can do with MyChart, go to [email or web address].    Your next appointment:   12 month(s)  The format for your next appointment:   In Person  Provider:   You may see Sinclair Grooms, MD or one of the following Advanced Practice Providers on your designated Care Team:   Cecilie Kicks, NP    Other Instructions    Signed, Leanor Kail, [place]  10/26/2020 2:14 PM    [place]

## 2020-10-29 ENCOUNTER — Encounter: Payer: Self-pay | Admitting: Family Medicine

## 2020-10-29 DIAGNOSIS — M810 Age-related osteoporosis without current pathological fracture: Secondary | ICD-10-CM | POA: Insufficient documentation

## 2020-10-31 ENCOUNTER — Other Ambulatory Visit: Payer: Self-pay

## 2020-10-31 ENCOUNTER — Ambulatory Visit (HOSPITAL_COMMUNITY): Payer: Medicare HMO | Attending: Cardiovascular Disease

## 2020-10-31 DIAGNOSIS — R011 Cardiac murmur, unspecified: Secondary | ICD-10-CM | POA: Insufficient documentation

## 2020-10-31 LAB — ECHOCARDIOGRAM COMPLETE
AR max vel: 1.6 cm2
AV Area VTI: 1.66 cm2
AV Area mean vel: 1.71 cm2
AV Mean grad: 10.5 mmHg
AV Peak grad: 21 mmHg
Ao pk vel: 2.29 m/s
Area-P 1/2: 2.21 cm2
S' Lateral: 3 cm

## 2020-11-06 ENCOUNTER — Other Ambulatory Visit: Payer: Self-pay | Admitting: Family Medicine

## 2020-11-16 ENCOUNTER — Encounter: Payer: Self-pay | Admitting: Family Medicine

## 2020-11-16 ENCOUNTER — Other Ambulatory Visit: Payer: Self-pay

## 2020-11-16 ENCOUNTER — Ambulatory Visit (INDEPENDENT_AMBULATORY_CARE_PROVIDER_SITE_OTHER): Payer: Medicare HMO | Admitting: Family Medicine

## 2020-11-16 VITALS — BP 132/84 | HR 64 | Temp 98.0°F | Ht 60.98 in | Wt 225.4 lb

## 2020-11-16 DIAGNOSIS — I152 Hypertension secondary to endocrine disorders: Secondary | ICD-10-CM

## 2020-11-16 DIAGNOSIS — E119 Type 2 diabetes mellitus without complications: Secondary | ICD-10-CM

## 2020-11-16 DIAGNOSIS — E1159 Type 2 diabetes mellitus with other circulatory complications: Secondary | ICD-10-CM | POA: Diagnosis not present

## 2020-11-16 DIAGNOSIS — E210 Primary hyperparathyroidism: Secondary | ICD-10-CM

## 2020-11-16 DIAGNOSIS — M810 Age-related osteoporosis without current pathological fracture: Secondary | ICD-10-CM

## 2020-11-16 DIAGNOSIS — E538 Deficiency of other specified B group vitamins: Secondary | ICD-10-CM | POA: Diagnosis not present

## 2020-11-16 DIAGNOSIS — F3342 Major depressive disorder, recurrent, in full remission: Secondary | ICD-10-CM | POA: Diagnosis not present

## 2020-11-16 DIAGNOSIS — E039 Hypothyroidism, unspecified: Secondary | ICD-10-CM

## 2020-11-16 DIAGNOSIS — E1169 Type 2 diabetes mellitus with other specified complication: Secondary | ICD-10-CM

## 2020-11-16 DIAGNOSIS — I350 Nonrheumatic aortic (valve) stenosis: Secondary | ICD-10-CM | POA: Insufficient documentation

## 2020-11-16 DIAGNOSIS — E785 Hyperlipidemia, unspecified: Secondary | ICD-10-CM

## 2020-11-16 LAB — COMPREHENSIVE METABOLIC PANEL
ALT: 16 U/L (ref 0–35)
AST: 21 U/L (ref 0–37)
Albumin: 4 g/dL (ref 3.5–5.2)
Alkaline Phosphatase: 110 U/L (ref 39–117)
BUN: 14 mg/dL (ref 6–23)
CO2: 32 mEq/L (ref 19–32)
Calcium: 10.1 mg/dL (ref 8.4–10.5)
Chloride: 100 mEq/L (ref 96–112)
Creatinine, Ser: 0.97 mg/dL (ref 0.40–1.20)
GFR: 55.13 mL/min — ABNORMAL LOW (ref >60.00)
Glucose, Bld: 113 mg/dL — ABNORMAL HIGH (ref 70–99)
Potassium: 4.6 mEq/L (ref 3.5–5.1)
Sodium: 137 mEq/L (ref 135–145)
Total Bilirubin: 0.8 mg/dL (ref 0.2–1.2)
Total Protein: 6.6 g/dL (ref 6.0–8.3)

## 2020-11-16 LAB — LIPID PANEL
Cholesterol: 130 mg/dL (ref 0–200)
HDL: 60.1 mg/dL (ref >39.00)
LDL Cholesterol: 45 mg/dL (ref 0–99)
NonHDL: 70.35
Total CHOL/HDL Ratio: 2
Triglycerides: 128 mg/dL (ref 0.0–149.0)
VLDL: 25.6 mg/dL (ref 0.0–40.0)

## 2020-11-16 LAB — CBC WITH DIFFERENTIAL/PLATELET
Basophils Absolute: 0.1 10*3/uL (ref 0.0–0.1)
Basophils Relative: 1.4 % (ref 0.0–3.0)
Eosinophils Absolute: 0.4 10*3/uL (ref 0.0–0.7)
Eosinophils Relative: 7 % — ABNORMAL HIGH (ref 0.0–5.0)
HCT: 41.7 % (ref 36.0–46.0)
Hemoglobin: 13.7 g/dL (ref 12.0–15.0)
Lymphocytes Relative: 26.3 % (ref 12.0–46.0)
Lymphs Abs: 1.6 10*3/uL (ref 0.7–4.0)
MCHC: 32.9 g/dL (ref 30.0–36.0)
MCV: 92.3 fl (ref 78.0–100.0)
Monocytes Absolute: 0.6 10*3/uL (ref 0.1–1.0)
Monocytes Relative: 9.8 % (ref 3.0–12.0)
Neutro Abs: 3.4 10*3/uL (ref 1.4–7.7)
Neutrophils Relative %: 55.5 % (ref 43.0–77.0)
Platelets: 211 10*3/uL (ref 150.0–400.0)
RBC: 4.52 Mil/uL (ref 3.87–5.11)
RDW: 15 % (ref 11.5–15.5)
WBC: 6.1 10*3/uL (ref 4.0–10.5)

## 2020-11-16 LAB — HEMOGLOBIN A1C: Hgb A1c MFr Bld: 6.9 % — ABNORMAL HIGH (ref 4.6–6.5)

## 2020-11-16 MED ORDER — DICLOFENAC SODIUM 75 MG PO TBEC: 75.0000 mg | DELAYED_RELEASE_TABLET | Freq: Two times a day (BID) | ORAL | 3 refills | Status: DC

## 2020-11-16 NOTE — Patient Instructions (Addendum)
Please stop by lab before you go If you have mychart- we will send your results within 3 business days of [place] receiving them.  If you do not have mychart- we will call you about results within 5 business days of [place] receiving them.  *please also note that you will see labs on mychart as soon as they post. I will later go in and write notes on them- will say "notes from Dr. Yong Channel"  We will call you within two weeks about your referral to Endocrinology with [place] . If you do not hear within 2 weeks, give [place] a call.   Recommend that you make efforts in getting regular exercise daily - goal is to get at least 150 minutes of regular exercise per week.  For your hypertension, you may continue your current medications unless blood pressure is still elevated in your next visit then we may need to consider adding on another medication.  Team please log Flu and COVID-19 vaccinations.  Recommended follow up: Return in about 3 months (around 02/16/2021) for a follow-up or sooner if needed.

## 2020-11-16 NOTE — Progress Notes (Signed)
Phone [phone] In person visit   Subjective:   [name] is a 80 y.o. year old very pleasant female patient who presents for/with See problem oriented charting Chief Complaint  Patient presents with   Follow-up   Diabetes   This visit occurred during the SARS-CoV-2 public health emergency.  Safety protocols were in place, including screening questions prior to the visit, additional usage of staff PPE, and extensive cleaning of exam room while observing appropriate contact time as indicated for disinfecting solutions.   Past Medical History-  Patient Active Problem List   Diagnosis Date Noted   Osteoporosis 10/29/2020    Priority: High   History of polymyalgia rheumatica 03/16/2018    Priority: High   CAD (coronary artery disease) 11/19/2017    Priority: High   Hypercalcemia 12/29/2014    Priority: High   OA (osteoarthritis) of knee 10/05/2013    Priority: High   Well controlled type 2 diabetes mellitus ([place]) 07/18/2006    Priority: High   B12 deficiency 05/31/2019    Priority: Medium    Overactive bladder 09/03/2016    Priority: Medium    Morbid obesity ([place]) 06/28/2015    Priority: Medium    History of gastric bypass 02/08/2014    Priority: Medium    Hyperlipidemia associated with type 2 diabetes mellitus ([place]) 01/05/2008    Priority: Medium    Hypothyroidism 09/09/2007    Priority: Medium    Major depression, recurrent, full remission ([place]) 07/18/2006    Priority: Medium    Hypertension associated with diabetes ([place]) 07/18/2006    Priority: Medium    Asthma 07/18/2006    Priority: Medium    Primary osteoarthritis of right knee 06/28/2019    Priority: Low   Allergic rhinitis 07/20/2014    Priority: Low   CHEST WALL PAIN, ANTERIOR 10/09/2007    Priority: Low    Medications- reviewed and updated Current Outpatient Medications  Medication Sig Dispense Refill   acetaminophen (TYLENOL) 500 MG tablet Take 1,000 mg by mouth every 8 (eight) hours as  needed for mild pain or headache.     albuterol (VENTOLIN HFA) 108 (90 Base) MCG/ACT inhaler Inhale 1-2 puffs into the lungs every 4 (four) hours as needed for wheezing or shortness of breath. 18 g 2   amLODipine (NORVASC) 10 MG tablet Take 1 tablet (10 mg total) by mouth daily. 90 tablet 2   aspirin EC 81 MG tablet Take 81 mg by mouth daily. Swallow whole.     Biotin 5000 MCG CAPS      Cholecalciferol (VITAMIN D-3) 125 MCG (5000 UT) TABS Take 5,000 Units by mouth daily.     cyanocobalamin (,VITAMIN B-12,) 1000 MCG/ML injection Inject 1 mL (1,000 mcg total) into the muscle every 30 (thirty) days. Uses on amonthly basis (Patient taking differently: Inject 1,000 mcg into the muscle every 30 (thirty) days.) 10 mL 0   desvenlafaxine (PRISTIQ) 100 MG 24 hr tablet Take 1 tablet (100 mg total) by mouth daily. 90 tablet 2   FeFum-FePo-FA-B Cmp-C-Zn-Mn-Cu (SE-TAN PLUS) 162-115.2-1 MG CAPS TAKE ONE CAPSULE BY MOUTH TWICE A DAY 180 capsule 0   fexofenadine (ALLEGRA) 180 MG tablet Take 180 mg by mouth daily as needed for allergies or rhinitis.     fluticasone (FLONASE) 50 MCG/ACT nasal spray Place 2 sprays into both nostrils daily. 32 g 0   folic acid (FOLVITE) 1 MG tablet Taking 1 tablet once a day     glucose blood test strip Test blood sugar  once per day 100 each 3   Levothyroxine Sodium 150 MCG CAPS Take 300 mcg by mouth daily before breakfast.     loperamide (IMODIUM) 2 MG capsule Take 4 mg by mouth daily as needed for diarrhea or loose stools.      methotrexate (RHEUMATREX) 10 MG tablet Take 10 mg by mouth once a week. Caution: Chemotherapy. Protect from light.     nitroGLYCERIN (NITROSTAT) 0.4 MG SL tablet Place 1 tablet (0.4 mg total) under the tongue every 5 (five) minutes as needed for chest pain. 50 tablet 3   Omega-3 Fatty Acids (FISH OIL) 1000 MG CAPS Take 1,000 mg by mouth daily.     predniSONE (DELTASONE) 5 MG tablet Take by mouth.     Propylene Glycol (SYSTANE COMPLETE OP) Place 1 drop into  both eyes in the morning.     REPATHA SURECLICK 761 MG/ML SOAJ      TRUEplus Lancets 30G MISC Check blood sugar once per day 100 each 3   diclofenac (VOLTAREN) 75 MG EC tablet Take 1 tablet (75 mg total) by mouth 2 (two) times daily. 180 tablet 3   montelukast (SINGULAIR) 10 MG tablet TAKE 1 TABLET EVERY DAY WITH BREAKFAST (Patient not taking: Reported on 11/16/2020) 90 tablet 3   Zinc 50 MG TABS  (Patient not taking: Reported on 11/16/2020)     No current facility-administered medications for this visit.     Objective:  BP 132/84   Pulse 64   Temp 98 F (36.7 C)   Ht 5' 0.98" (1.549 m)   Wt 225 lb 6.4 oz (102.2 kg)   SpO2 97%   BMI 42.61 kg/m  Gen: NAD, resting comfortably CV: RRR stable murmur Lungs: CTAB no crackles, wheeze, rhonchi Ext: minimal edema Skin: warm, dry     Assessment and Plan   #social update-lost best friend in [place] to multiple myeloma April 2022.  Patient was able to celebrate her 80th birthday at [facility] earlier this year though!  #hypothyroidism - due to Hashimoto's thyroiditis - managed by Dr. Elyse Hsu in the past and now with Dr. Grandville Silos from [facility] S: compliant On thyroid medication-tirosint/levothyroxine 150 mcg daily- one for 6 days and then 2 on one day a week - TSH was slightly low on 10/17/20 with free t4 high A/P:Patient reports was overtreated and recently adjusted -will refer to [place] [facility] for 2nd opinion per her request  #Hyperparathyroidism/high calcium- new endocrinologist- reports he would like to move forward with surgery initially- then later with thyroid off wanted to adjust that first and then reassess -potential surgery would be with Dr. Harlow Asa -will refer to [place] [facility] for 2nd opinion per her request -also has osteoprosis as related to the prednisone and parathyroid- needs parathyroid addressed as first step- interested in endocrine opinion on this as well  # Urgent Care F/U for Complicated UTI No recurrent  issues since august  #CAD  #mild aortic stenosis on murmur- stable  #hyperlipidemia S: Medication: Repatha 140 mg/mL every 14 days, aspirin 81 mg - no chest pain or shortness of breath still Lab Results  Component Value Date   CHOL 150 08/30/2019   HDL 82 08/30/2019   LDLCALC 49 08/30/2019   LDLDIRECT 60.0 05/31/2019   TRIG 96 08/30/2019   CHOLHDL 1.8 08/30/2019   A/P: For CAD-asymptomatic- continue current meds For HLD-update lipid panel- continue current meds  # Diabetes S: Medication: none -diet/exercise controlled Exercise and diet- not exercising, has slipped on diet Lab Results  Component  Value Date   HGBA1C 6.8 (H) 07/11/2020   HGBA1C 6.5 03/09/2020   HGBA1C 6.6 (H) 08/30/2019   A/P:  hopefully stable but worried worsening- update A1c today. Continue without meds for now   #hypertension S: medication: Amlodipine 10 mg daily - in 10/26/2020 cardiology visit [place], instructed patient to log home blood pressures for two weeks and review today in office-see below Home readings #s: variable from 120s up to 160s BP Readings from Last 3 Encounters:  11/16/20 132/84  10/26/20 (!) 150/86  09/15/20 (!) 164/78  A/P:  Controlled on reading in office- some higher readings at home- discussed possibly adding meds- she would like to hold off for now-  Continue current medications unless high in office again (once thyroid and parthyroid addressed)  # Depression S: Medication: Compliant on Pristiq 100 mg daily with some agitation  -still grieving lost of best friend from earlier this year  A/P: phq9 of 8- reports a lot of health related stress- feels depressoin controlled- continue current meds for now- would consider full remission with some situational stress  # B12 deficiency S: Current treatment/medication (oral vs. IM):  remains on B12 injections at home. -well controlled in last two visits Lab Results  Component Value Date   VITAMINB12 714 03/09/2020  A/P:  We will recheck this at least annually-defer to next visit  #Asthma-presently well controlled off Singulair (prefers to stay off if she can due to potential mood issues)-not having use albuterol lately  #Fibromyalgia/Bilateral knee pain/Lower back pain- S:  ongoing issues with this. 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  -Patient also requests referral to physical therapy for knee and back pain-referral placed on 07/11/2020-helped short term but when stopped exercises worsened - she actually has follow up scheduled with Dr. Amil Amen (was placed on methotrexate) and also on 5 mg of prednisone- she is feeling better after 5 weeks.   A/P:overall stable -monitor renal function- GFR remains high 50s- may need to tweak if this worsens and stop the diclofenact  #Elevated alkaline phosphatase-was normal when fasting-repeat fasting if elevated again in the future Lab Results  Component Value Date   ALT 24 07/11/2020   AST 19 07/11/2020   ALKPHOS 112 07/11/2020   BILITOT 0.6 07/11/2020   Recommended follow up: Return in about 3 months (around 02/16/2021) for a follow-up. Future Appointments  Date Time Provider [place]  04/16/2021  2:15 PM [facility] CCM PHARMACIST [facility]   Lab/Order associations:  FASTING   ICD-10-CM   1. Well controlled type 2 diabetes mellitus ([place])  E11.9 CBC with Differential/Platelet    Comprehensive metabolic panel    Lipid panel    Hemoglobin A1c    2. Hypertension associated with diabetes ([place])  E11.59    I15.2     3. Hyperlipidemia associated with type 2 diabetes mellitus ([place])  E11.69    E78.5     4. B12 deficiency  E53.8     5. Hypothyroidism, unspecified type  E03.9 Ambulatory referral to Endocrinology    6. Major depression, recurrent, full remission ([place])  F33.42     7. Primary hyperparathyroidism ([place])  E21.0 Ambulatory referral to  Endocrinology    8. Osteoporosis, unspecified osteoporosis type, unspecified pathological fracture presence  M81.0 Ambulatory referral to Endocrinology    9. Mild aortic stenosis  I35.0     10. Morbid obesity ([place]) Chronic Encouraged need  for healthy eating, regular exercise, weight loss.   E66.01       Meds ordered this encounter  Medications   diclofenac (VOLTAREN) 75 MG EC tablet    Sig: Take 1 tablet (75 mg total) by mouth 2 (two) times daily.    Dispense:  180 tablet    Refill:  3    I,Harris Phan,acting as a scribe for Garret Reddish, MD.,have documented all relevant documentation on the behalf of Garret Reddish, MD,as directed by  Garret Reddish, MD while in the presence of Garret Reddish, MD.   I, Garret Reddish, MD, have reviewed all documentation for this visit. The documentation on 11/16/20 for the exam, diagnosis, procedures, and orders are all accurate and complete.   Return precautions advised.  Garret Reddish, MD

## 2020-11-20 ENCOUNTER — Other Ambulatory Visit: Payer: Self-pay | Admitting: Family Medicine

## 2020-11-25 ENCOUNTER — Other Ambulatory Visit: Payer: Self-pay | Admitting: Interventional Cardiology

## 2020-12-05 DIAGNOSIS — M353 Polymyalgia rheumatica: Secondary | ICD-10-CM | POA: Diagnosis not present

## 2020-12-05 DIAGNOSIS — M5136 Other intervertebral disc degeneration, lumbar region: Secondary | ICD-10-CM | POA: Diagnosis not present

## 2020-12-05 DIAGNOSIS — Z7952 Long term (current) use of systemic steroids: Secondary | ICD-10-CM | POA: Diagnosis not present

## 2020-12-05 DIAGNOSIS — M150 Primary generalized (osteo)arthritis: Secondary | ICD-10-CM | POA: Diagnosis not present

## 2020-12-05 DIAGNOSIS — Z6841 Body Mass Index (BMI) 40.0 and over, adult: Secondary | ICD-10-CM | POA: Diagnosis not present

## 2020-12-19 DIAGNOSIS — E210 Primary hyperparathyroidism: Secondary | ICD-10-CM | POA: Diagnosis not present

## 2020-12-26 ENCOUNTER — Other Ambulatory Visit (HOSPITAL_COMMUNITY): Payer: Self-pay | Admitting: Surgery

## 2020-12-26 ENCOUNTER — Other Ambulatory Visit: Payer: Self-pay | Admitting: Surgery

## 2020-12-26 DIAGNOSIS — E210 Primary hyperparathyroidism: Secondary | ICD-10-CM

## 2020-12-27 DIAGNOSIS — H524 Presbyopia: Secondary | ICD-10-CM | POA: Diagnosis not present

## 2020-12-27 DIAGNOSIS — E119 Type 2 diabetes mellitus without complications: Secondary | ICD-10-CM | POA: Diagnosis not present

## 2020-12-27 DIAGNOSIS — Z961 Presence of intraocular lens: Secondary | ICD-10-CM | POA: Diagnosis not present

## 2020-12-27 DIAGNOSIS — H5212 Myopia, left eye: Secondary | ICD-10-CM | POA: Diagnosis not present

## 2020-12-27 LAB — HM DIABETES EYE EXAM

## 2020-12-28 ENCOUNTER — Telehealth: Payer: Self-pay | Admitting: Pharmacist

## 2020-12-28 NOTE — Chronic Care Management (AMB) (Signed)
Chronic Care Management Pharmacy Assistant   Name: [name]  MRN: [id number] DOB: 1940-07-11   Reason for Encounter: General Adherence Call    Recent office visits:  11/16/2020 OV (PCP) Marin Olp, MD; no medication changes indicated.  Recent consult visits:  12/19/2020 OV (General Surgery) Gomez Cleverly, MD; no medication changes indicated.  10/26/2020 OV (Cardiology) [place], [place], [place]; no medication changes indicated.  10/17/2020 OV (Endocrinology) Lanney Gins, MD; no medication changes indicated.  Hospital visits:  None in previous 6 months  Medications: Outpatient Encounter Medications as of 12/28/2020  Medication Sig   acetaminophen (TYLENOL) 500 MG tablet Take 1,000 mg by mouth every 8 (eight) hours as needed for mild pain or headache.   albuterol (VENTOLIN HFA) 108 (90 Base) MCG/ACT inhaler Inhale 1-2 puffs into the lungs every 4 (four) hours as needed for wheezing or shortness of breath.   amLODipine (NORVASC) 10 MG tablet TAKE 1 TABLET EVERY DAY   aspirin EC 81 MG tablet Take 81 mg by mouth daily. Swallow whole.   Biotin 5000 MCG CAPS    Cholecalciferol (VITAMIN D-3) 125 MCG (5000 UT) TABS Take 5,000 Units by mouth daily.   cyanocobalamin (,VITAMIN B-12,) 1000 MCG/ML injection Inject 1 mL (1,000 mcg total) into the muscle every 30 (thirty) days. Uses on amonthly basis (Patient taking differently: Inject 1,000 mcg into the muscle every 30 (thirty) days.)   desvenlafaxine (PRISTIQ) 100 MG 24 hr tablet Take 1 tablet (100 mg total) by mouth daily.   diclofenac (VOLTAREN) 75 MG EC tablet Take 1 tablet (75 mg total) by mouth 2 (two) times daily.   Evolocumab (REPATHA SURECLICK) 601 MG/ML SOAJ INJECT 1 PEN SUBCUTANEOUSLY EVERY 14 DAYS   FeFum-FePo-FA-B Cmp-C-Zn-Mn-Cu (SE-TAN PLUS) 162-115.2-1 MG CAPS TAKE ONE CAPSULE BY MOUTH TWICE A DAY   fexofenadine (ALLEGRA) 180 MG tablet Take 180 mg by mouth daily as needed for allergies or  rhinitis.   fluticasone (FLONASE) 50 MCG/ACT nasal spray PLACE 2 SPRAYS INTO BOTH NOSTRILS DAILY.   folic acid (FOLVITE) 1 MG tablet Taking 1 tablet once a day   glucose blood test strip Test blood sugar once per day   Levothyroxine Sodium 150 MCG CAPS Take 300 mcg by mouth daily before breakfast.   loperamide (IMODIUM) 2 MG capsule Take 4 mg by mouth daily as needed for diarrhea or loose stools.    methotrexate (RHEUMATREX) 10 MG tablet Take 10 mg by mouth once a week. Caution: Chemotherapy. Protect from light.   montelukast (SINGULAIR) 10 MG tablet TAKE 1 TABLET EVERY DAY WITH BREAKFAST (Patient not taking: Reported on 11/16/2020)   nitroGLYCERIN (NITROSTAT) 0.4 MG SL tablet Place 1 tablet (0.4 mg total) under the tongue every 5 (five) minutes as needed for chest pain.   Omega-3 Fatty Acids (FISH OIL) 1000 MG CAPS Take 1,000 mg by mouth daily.   predniSONE (DELTASONE) 5 MG tablet Take by mouth.   Propylene Glycol (SYSTANE COMPLETE OP) Place 1 drop into both eyes in the morning.   TRUEplus Lancets 30G MISC Check blood sugar once per day   Zinc 50 MG TABS  (Patient not taking: Reported on 11/16/2020)   No facility-administered encounter medications on file as of 12/28/2020.   Patient Questions: Have you had any problems recently with your health? Believes she has developed conjunctivitis, ophthalmologist said "it's a bad case of allergic conjunctivitis." Now using eye drops with prednisone.  Have you had any problems with your pharmacy? Patient states she has  not had any problems recently with her pharmacy.  What issues or side effects are you having with your medications? Patient denies having any issues or side effects with any of her medications.  What would you like me to pass along to Leata Mouse, CPP for him to help you with?  Patient states she does not have anything to pass along at this time.  What can we do to take care of you better? Patient did not have any suggestions. She  is happy with her current level of care.  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  Care Gaps: Medicare Annual Wellness: Due now - scheduled 02/12/21 Ophthalmology Exam: Ordered on 03/09/2020 Foot Exam: Overdue since 05/30/2020 Hemoglobin A1C: 6.9% on 11/16/2020  Colonoscopy: 10/19/2014 Dexa Scan: Completed Mammogram: Aged out, last completed 11/06/2016  Future Appointments  Date Time Provider [place]  01/08/2021 10:00 AM [facility] INJ 1 [facility] [place]  01/08/2021 12:00 PM [facility]NM 1 [facility] [place]  01/08/2021  1:30 PM [facility]US 1 [facility] [place]  02/12/2021  1:45 PM [facility] HEALTH COACH [facility]  02/16/2021 11:20 AM Marin Olp, MD [facility]  04/16/2021  2:15 PM [facility] CCM PHARMACIST [facility]     Star Rating Drugs: None  April D Calhoun, [place] Pharmacist Assistant [phone]

## 2021-01-01 DIAGNOSIS — H10413 Chronic giant papillary conjunctivitis, bilateral: Secondary | ICD-10-CM | POA: Diagnosis not present

## 2021-01-05 ENCOUNTER — Ambulatory Visit (HOSPITAL_COMMUNITY): Payer: Medicare HMO

## 2021-01-08 ENCOUNTER — Ambulatory Visit (HOSPITAL_COMMUNITY)
Admission: RE | Admit: 2021-01-08 | Discharge: 2021-01-08 | Disposition: A | Discharge status: Home / Self Care | Payer: Medicare HMO | Source: Ambulatory Visit | Attending: Surgery | Admitting: Surgery

## 2021-01-08 ENCOUNTER — Other Ambulatory Visit: Payer: Self-pay

## 2021-01-08 DIAGNOSIS — E210 Primary hyperparathyroidism: Secondary | ICD-10-CM | POA: Diagnosis not present

## 2021-01-08 DIAGNOSIS — E213 Hyperparathyroidism, unspecified: Secondary | ICD-10-CM | POA: Diagnosis not present

## 2021-01-08 DIAGNOSIS — E0590 Thyrotoxicosis, unspecified without thyrotoxic crisis or storm: Secondary | ICD-10-CM | POA: Diagnosis not present

## 2021-01-08 DIAGNOSIS — D351 Benign neoplasm of parathyroid gland: Secondary | ICD-10-CM | POA: Diagnosis not present

## 2021-01-08 MED ORDER — TECHNETIUM TC 99M SESTAMIBI GENERIC - CARDIOLITE
25.0000 | Freq: Once | INTRAVENOUS | Status: AC
  Administered 2021-01-08: 10:00:00 25.5 via INTRAVENOUS

## 2021-01-17 NOTE — Progress Notes (Signed)
No significant abnormality in thyroid gland, but no enlarged parathyroid gland seen.  [place], MD [facility] [place] practice Office: [phone]

## 2021-01-17 NOTE — Progress Notes (Signed)
Good news!  A left inferior adenoma is identified and we will need to plan surgery to remove this gland in the near future.  However, there is an abnormality on the right side that we will need to get a CT scan to evaluate prior to scheduling surgery for the parathyroid gland.  If there is something there that needs attention, we want to do it at the same time.  Claiborne Billings - please schedule CT chest for further evaluation of the finding on the nuclear scan.  tmg  Armandina Gemma, [place] [facility] [place] practice Office: [phone]

## 2021-01-29 ENCOUNTER — Other Ambulatory Visit: Payer: Self-pay | Admitting: Surgery

## 2021-01-29 DIAGNOSIS — I10 Essential (primary) hypertension: Secondary | ICD-10-CM

## 2021-02-01 ENCOUNTER — Ambulatory Visit
Admission: RE | Admit: 2021-02-01 | Discharge: 2021-02-01 | Disposition: A | Discharge status: Home / Self Care | Payer: Medicare HMO | Source: Ambulatory Visit | Attending: Surgery | Admitting: Surgery

## 2021-02-01 DIAGNOSIS — I10 Essential (primary) hypertension: Secondary | ICD-10-CM

## 2021-02-01 DIAGNOSIS — I700 Atherosclerosis of aorta: Secondary | ICD-10-CM | POA: Diagnosis not present

## 2021-02-01 DIAGNOSIS — J45909 Unspecified asthma, uncomplicated: Secondary | ICD-10-CM | POA: Diagnosis not present

## 2021-02-01 DIAGNOSIS — I2510 Atherosclerotic heart disease of native coronary artery without angina pectoris: Secondary | ICD-10-CM | POA: Diagnosis not present

## 2021-02-01 DIAGNOSIS — J984 Other disorders of lung: Secondary | ICD-10-CM | POA: Diagnosis not present

## 2021-02-01 MED ORDER — IOPAMIDOL (ISOVUE-300) INJECTION 61%
75.0000 mL | Freq: Once | INTRAVENOUS | Status: AC | PRN
  Administered 2021-02-01: 75 mL via INTRAVENOUS

## 2021-02-04 ENCOUNTER — Other Ambulatory Visit: Payer: Self-pay | Admitting: Family Medicine

## 2021-02-04 ENCOUNTER — Ambulatory Visit: Payer: Self-pay | Admitting: Surgery

## 2021-02-04 NOTE — Progress Notes (Signed)
CT of chest does not show any abnormality to correspond with the signal on sestamibi scan in the right posterior mediastinum.  Good news.  Will plan to proceed with left inferior parathyroidectomy as an out-patient procedure.  [place], MD [facility] [place] practice Office: [phone]

## 2021-02-09 NOTE — Progress Notes (Signed)
Phone [phone] In person visit   Subjective:   [name] is a 81 y.o. year old very pleasant female patient who presents for/with See problem oriented charting Chief Complaint  Patient presents with   Hypertension   Hypothyroidism   This visit occurred during the SARS-CoV-2 public health emergency.  Safety protocols were in place, including screening questions prior to the visit, additional usage of staff PPE, and extensive cleaning of exam room while observing appropriate contact time as indicated for disinfecting solutions.   Past Medical History-  Patient Active Problem List   Diagnosis Date Noted   Osteoporosis 10/29/2020    Priority: High   History of [name] 03/16/2018    Priority: High   CAD (coronary artery disease) 11/19/2017    Priority: High   Hypercalcemia 12/29/2014    Priority: High   OA (osteoarthritis) of knee 10/05/2013    Priority: High   Well controlled type 2 diabetes mellitus ([name]) 07/18/2006    Priority: High   Mild aortic stenosis 11/16/2020    Priority: Medium    B12 deficiency 05/31/2019    Priority: Medium    Overactive bladder 09/03/2016    Priority: Medium    Morbid obesity ([name]) 06/28/2015    Priority: Medium    History of gastric bypass 02/08/2014    Priority: Medium    Hyperlipidemia associated with type 2 diabetes mellitus ([name]) 01/05/2008    Priority: Medium    Hypothyroidism 09/09/2007    Priority: Medium    Major depression, recurrent, full remission ([name]) 07/18/2006    Priority: Medium    Hypertension associated with diabetes ([name]) 07/18/2006    Priority: Medium    Asthma 07/18/2006    Priority: Medium    Primary osteoarthritis of right knee 06/28/2019    Priority: Low   Allergic rhinitis 07/20/2014    Priority: Low   CHEST WALL PAIN, ANTERIOR 10/09/2007    Priority: Low   Aortic atherosclerosis ([name]) 02/16/2021    Medications- reviewed and updated Current Outpatient Medications  Medication  Sig Dispense Refill   albuterol (VENTOLIN HFA) 108 (90 Base) MCG/ACT inhaler Inhale 1-2 puffs into the lungs every 4 (four) hours as needed for wheezing or shortness of breath. 18 g 2   amLODipine (NORVASC) 10 MG tablet TAKE 1 TABLET EVERY DAY 90 tablet 2   aspirin EC 81 MG tablet Take 81 mg by mouth daily. Swallow whole.     Biotin 5000 MCG CAPS      Cholecalciferol (VITAMIN D-3) 125 MCG (5000 UT) TABS Take 5,000 Units by mouth daily.     cyanocobalamin (,VITAMIN B-12,) 1000 MCG/ML injection Inject 1 mL (1,000 mcg total) into the muscle every 30 (thirty) days. Uses on amonthly basis (Patient taking differently: Inject 1,000 mcg into the muscle every 30 (thirty) days.) 10 mL 0   desvenlafaxine (PRISTIQ) 100 MG 24 hr tablet Take 1 tablet (100 mg total) by mouth daily. 90 tablet 2   diclofenac (VOLTAREN) 75 MG EC tablet Take 1 tablet (75 mg total) by mouth 2 (two) times daily. 180 tablet 3   fexofenadine (ALLEGRA) 180 MG tablet Take 180 mg by mouth daily as needed for allergies or rhinitis.     fluticasone (FLONASE) 50 MCG/ACT nasal spray USE 2 SPRAYS IN EACH NOSTRIL EVERY DAY 48 g 3   Levothyroxine Sodium 150 MCG CAPS Take 300 mcg by mouth daily before breakfast.     methotrexate (RHEUMATREX) 10 MG tablet Take 10 mg by mouth once a  week. Caution: Chemotherapy. Protect from light.     predniSONE (DELTASONE) 5 MG tablet Take by mouth.     acetaminophen (TYLENOL) 500 MG tablet Take 1,000 mg by mouth every 8 (eight) hours as needed for mild pain or headache.     Evolocumab (REPATHA SURECLICK) 628 MG/ML SOAJ INJECT 1 PEN SUBCUTANEOUSLY EVERY 14 DAYS 6 mL 3   FeFum-FePo-FA-B Cmp-C-Zn-Mn-Cu (SE-TAN PLUS) 162-115.2-1 MG CAPS TAKE ONE CAPSULE BY MOUTH TWICE A DAY 366 capsule 0   folic acid (FOLVITE) 1 MG tablet Taking 1 tablet once a day     glucose blood test strip Test blood sugar once per day 100 each 3   loperamide (IMODIUM) 2 MG capsule Take 4 mg by mouth daily as needed for diarrhea or loose stools.       nitroGLYCERIN (NITROSTAT) 0.4 MG SL tablet Place 1 tablet (0.4 mg total) under the tongue every 5 (five) minutes as needed for chest pain. (Patient not taking: Reported on 02/12/2021) 50 tablet 3   Omega-3 Fatty Acids (FISH OIL) 1000 MG CAPS Take 1,000 mg by mouth daily.     Propylene Glycol (SYSTANE COMPLETE OP) Place 1 drop into both eyes in the morning.     TRUEplus Lancets 30G MISC Check blood sugar once per day 100 each 3   No current facility-administered medications for this visit.     Objective:  BP 138/86 (BP Location: Left Arm, Patient Position: Sitting)    Pulse 66    Temp 97.9 F (36.6 C) (Temporal)    Ht 5\' 1"  (1.549 m)    Wt 226 lb 6.4 oz (102.7 kg)    SpO2 96%    BMI 42.78 kg/m  Gen: NAD, resting comfortably CV: RRR no murmurs rubs or gallops Lungs: CTAB no crackles, wheeze, rhonchi Ext: trace edema Skin: warm, dry    Assessment and Plan   Updates from other offices: 1.  Dr. Amil Amen- down to 2 mg prednisone rheumatology for [name] 2.  Dr. Harlow Asa- will have parathyroid removed for hyperparathyroidism.  She is medically maximized for surgery.  Would need clearance from cardiology  #hypothyroidism - due to Hashimoto's thyroiditis - managed by Dr. Elyse Hsu in the past and now with Dr. Grandville Silos from [facility] S: compliant On thyroid medication-tirosint/levothyroxine 150 mcg daily with 300 mcg just one day a week- was reduced from prior higher levels - TSH was slightly low on 10/17/20 with free t4 high A/P: Overtreated at last visit-medications have been adjusted and we will recheck labs and forward to Dr. Perley Jain also has follow-up in March with endocrinology   #Hyperparathyroidism/high calcium- new endocrinologist within office- reported he would like to move forward with surgery initially- then later with thyroid off wanted to adjust that first and then reassess -potential surgery would be with Dr. Harlow Asa -will refer to [facility] for 2nd  opinion per her request -also had osteoprosis as related to the prednisone and parathyroid- needed parathyroid addressed as first step- interested in endocrine opinion on this as well   #CAD  #mild aortic stenosis on murmur- stable on exam 10/31/20 #hyperlipidemia #aortic atherosclerosis- noted on ct 02/01/21 likely stable S: Medication: Repatha 140 mg/mL every 14 days, aspirin 81 mg - no chest pain. Stable shortness of breath with activity such as going up stairs.  Lab Results  Component Value Date   CHOL 130 11/16/2020   HDL 60.10 11/16/2020   LDLCALC 45 11/16/2020   LDLDIRECT 60.0 05/31/2019   TRIG 128.0 11/16/2020  CHOLHDL 2 11/16/2020   A/P: CAD largely asymptomatic/stable- continue current meds. Aortic stenosis stable- needs echo every 3 years likely. Lipids at goal on repatha- continue current meds. At goal for LDL under 70 due to aortic atherosclerosis.   # Diabetes S: Medication: none -diet/exercise controlled Lab Results  Component Value Date   HGBA1C 6.9 (H) 11/16/2020   HGBA1C 6.8 (H) 07/11/2020   HGBA1C 6.5 03/09/2020   A/P: hopefully stable- update a1c on Monday (day too early to draw today) today.  #hypertension S: medication: Amlodipine 10 mg daily - in 10/26/2020 cardiology visit [place], [place], instructed patient to log home blood pressures for two weeks BP Readings from Last 3 Encounters:  02/16/21 138/86  11/16/20 132/84  10/26/20 (!) 150/86  A/P: Controlled. Continue current medications.   # Depression S: Medication: Compliant on Pristiq 100 mg daily with some agitation  -lost best friend last year- drops off dogs daily -daughter who lives close encourages her to out more Depression screen [facility] 2/9 02/16/2021 02/12/2021 11/16/2020  Decreased Interest 0 0 0  Down, Depressed, Hopeless 0 0 0  PHQ - 2 Score 0 0 0  Altered sleeping 0 - 2  Tired, decreased energy 1 - 3  Change in appetite 0 - 3  Feeling bad or failure about yourself  0 - 0  Trouble  concentrating 1 - 0  Moving slowly or fidgety/restless 0 - 0  Suicidal thoughts 0 - 0  PHQ-9 Score 2 - 8  Difficult doing work/chores Not difficult at all - Somewhat difficult  Some recent data might be hidden  A/P: full remission with PHQ9 under 5. Still some imperfect days. Will continue current meds  # B12 deficiency S: Current treatment/medication (oral vs. IM):  remains on B12 injections at home- may miss a month at times -well controlled in last two visits Lab Results  Component Value Date   VITAMINB12 714 03/09/2020  A/P: with intermittent usage of injections - update b12 with labs- last done 2 months ago- advised hold off over weekend    #Asthma/allergies-presently well controlled off Singulair (preferred to stay off if she can due to potential mood issues)-not having use albuterol lately   #Fibromyalgia/Bilateral knee pain/Lower back pain- Also with [name] followed by Dr. Amil Amen S:  Ongoing issues with this. 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 once again -Patient also requested referral to physical therapy for knee and back pain-referral placed on 07/11/2020-helped short term but when stopped exercises worsened - she actually has follow up scheduled with Dr. Amil Amen (was placed on methotrexate) and also on 5 mg of prednisone- she was feeling better after 5 weeks. now down to 2 mg  A/P: Fibromyalgia/bilateral knee pain overall stable.  Diclofenac not ideal but has certainly helped her pain-monitor renal function.  Slowly being weaned from prednisone with help of Dr. Amil Amen -she may want sent to a new pharmacy to see if they have older brand- possibly [facility] for diclonac- hasnt picked up recently with the newer brand -  may want to return to pt after surgery- she will call us   #Morbid obesity-with BMI over 40-patient is working with weight  watchers-congratulated her for making these efforts particularly in light of multiple comorbidities and potential benefit in several areas.  She can be rather hard on herself and I wanted her to focus on even simply the fact that she  continues to show up with visits-she reports some weight loss at home-continue current efforts    Recommended follow up: Return in about 4 months (around 06/16/2021) for follow up- or sooner if needed. Future Appointments  Date Time Provider [place]  03/01/2021 11:00 AM [facility] PAT 3 [facility] None  04/16/2021  2:15 PM [facility] CCM PHARMACIST [facility]  06/11/2021 10:00 AM Marin Olp, MD [facility]  02/25/2022  2:30 PM [facility] HEALTH COACH [facility]    Lab/Order associations:   ICD-10-CM   1. Well controlled type 2 diabetes mellitus ([place])  E11.9 Hemoglobin A1c    CBC with Differential/Platelet    Comprehensive metabolic panel    2. Hyperlipidemia associated with type 2 diabetes mellitus ([place])  E11.69    E78.5     3. Primary hyperparathyroidism ([place])  E21.0     4. Aortic atherosclerosis ([facility])  I70.0     5. Major depression, recurrent, full remission ([place]) Chronic F33.42     6. Morbid obesity ([place]) Chronic E66.01     7. Hypothyroidism, unspecified type  E03.9 T4, free    TSH    8. B12 deficiency  E53.8 Vitamin B12    9. Mild aortic stenosis  I35.0     10. Coronary artery disease involving native coronary artery of native heart without angina pectoris  I25.10     11. Primary hypertension  I10      I,Jada Bradford,acting as a scribe for Garret Reddish, MD.,have documented all relevant documentation on the behalf of Garret Reddish, MD,as directed by  Garret Reddish, MD while in the presence of Garret Reddish, MD.   I, Garret Reddish, MD, have reviewed all documentation for this visit. The documentation on 02/16/21 for the exam, diagnosis, procedures, and orders are all accurate and complete.   Return precautions advised.   Garret Reddish, MD

## 2021-02-12 ENCOUNTER — Ambulatory Visit (INDEPENDENT_AMBULATORY_CARE_PROVIDER_SITE_OTHER): Payer: Medicare HMO

## 2021-02-12 ENCOUNTER — Other Ambulatory Visit: Payer: Self-pay

## 2021-02-12 DIAGNOSIS — Z0000 Encounter for general adult medical examination without abnormal findings: Secondary | ICD-10-CM

## 2021-02-12 NOTE — Progress Notes (Signed)
Virtual Visit via Telephone Note  I connected with  [name] on 02/12/21 at  1:45 PM EST by telephone and verified that I am speaking with the correct person using two identifiers.  Medicare Annual Wellness visit completed telephonically due to Covid-19 pandemic.   Persons participating in this call: This Health Coach and this patient.   Location: Patient: Home Provider: Office    I discussed the limitations, risks, security and privacy concerns of performing an evaluation and management service by telephone and the availability of in person appointments. The patient expressed understanding and agreed to proceed.  Unable to perform video visit due to video visit attempted and failed and/or patient does not have video capability.   Some vital signs may be absent or patient reported.   Willette Brace, LPN   Subjective:   [name] is a 81 y.o. female who presents for Medicare Annual (Subsequent) preventive examination.  Review of Systems     Cardiac Risk Factors include: advanced age (>39men, >70 women);dyslipidemia;hypertension;diabetes mellitus;obesity (BMI >30kg/m2)     Objective:    There were no vitals filed for this visit. There is no height or weight on file to calculate BMI.  Advanced Directives 02/12/2021 07/18/2020 08/30/2019 06/28/2019 06/16/2019 04/02/2019 11/30/2018  Does Patient Have a Medical Advance Directive? Yes No No Yes No;Yes Yes Yes  Type of Advance Directive [place] will Living will;Healthcare Power of [place];Living will  Does patient want to make changes to medical advance directive? - - - No - Patient declined - No - Patient declined No - Patient declined  Copy of [place] in Chart? No - copy requested - - - - No - copy requested No - copy requested  Would patient like information on creating a medical advance directive?  - No - Patient declined No - Patient declined - No - Patient declined - -    Current Medications (verified) Outpatient Encounter Medications as of 02/12/2021  Medication Sig   acetaminophen (TYLENOL) 500 MG tablet Take 1,000 mg by mouth every 8 (eight) hours as needed for mild pain or headache.   albuterol (VENTOLIN HFA) 108 (90 Base) MCG/ACT inhaler Inhale 1-2 puffs into the lungs every 4 (four) hours as needed for wheezing or shortness of breath.   amLODipine (NORVASC) 10 MG tablet TAKE 1 TABLET EVERY DAY   aspirin EC 81 MG tablet Take 81 mg by mouth daily. Swallow whole.   Biotin 5000 MCG CAPS    Cholecalciferol (VITAMIN D-3) 125 MCG (5000 UT) TABS Take 5,000 Units by mouth daily.   cyanocobalamin (,VITAMIN B-12,) 1000 MCG/ML injection Inject 1 mL (1,000 mcg total) into the muscle every 30 (thirty) days. Uses on amonthly basis (Patient taking differently: Inject 1,000 mcg into the muscle every 30 (thirty) days.)   desvenlafaxine (PRISTIQ) 100 MG 24 hr tablet Take 1 tablet (100 mg total) by mouth daily.   diclofenac (VOLTAREN) 75 MG EC tablet Take 1 tablet (75 mg total) by mouth 2 (two) times daily.   Evolocumab (REPATHA SURECLICK) 381 MG/ML SOAJ INJECT 1 PEN SUBCUTANEOUSLY EVERY 14 DAYS   FeFum-FePo-FA-B Cmp-C-Zn-Mn-Cu (SE-TAN PLUS) 162-115.2-1 MG CAPS TAKE ONE CAPSULE BY MOUTH TWICE A DAY   fexofenadine (ALLEGRA) 180 MG tablet Take 180 mg by mouth daily as needed for allergies or rhinitis.   fluticasone (FLONASE) 50 MCG/ACT nasal spray USE 2 SPRAYS IN EACH NOSTRIL EVERY DAY  folic acid (FOLVITE) 1 MG tablet Taking 1 tablet once a day   glucose blood test strip Test blood sugar once per day   Levothyroxine Sodium 150 MCG CAPS Take 300 mcg by mouth daily before breakfast.   loperamide (IMODIUM) 2 MG capsule Take 4 mg by mouth daily as needed for diarrhea or loose stools.    methotrexate (RHEUMATREX) 10 MG tablet Take 10 mg by mouth once a week. Caution: Chemotherapy. Protect from light.    Omega-3 Fatty Acids (FISH OIL) 1000 MG CAPS Take 1,000 mg by mouth daily.   predniSONE (DELTASONE) 5 MG tablet Take by mouth.   Propylene Glycol (SYSTANE COMPLETE OP) Place 1 drop into both eyes in the morning.   TRUEplus Lancets 30G MISC Check blood sugar once per day   montelukast (SINGULAIR) 10 MG tablet TAKE 1 TABLET EVERY DAY WITH BREAKFAST (Patient not taking: Reported on 11/16/2020)   nitroGLYCERIN (NITROSTAT) 0.4 MG SL tablet Place 1 tablet (0.4 mg total) under the tongue every 5 (five) minutes as needed for chest pain. (Patient not taking: Reported on 02/12/2021)   [DISCONTINUED] Zinc 50 MG TABS  (Patient not taking: Reported on 02/12/2021)   No facility-administered encounter medications on file as of 02/12/2021.    Allergies (verified) Flexeril [cyclobenzaprine], Ace inhibitors, Codeine, Kiwi extract, Singulair [montelukast], and Azithromycin   History: Past Medical History:  Diagnosis Date   Anemia    Arthritis    osteoarthritis - shoulder, knees & hips   Asthma    Coronary artery disease    Depression    Diabetes mellitus without complication ([place])    no meds   Hypertension    Dr. Orinda Kenner manages BP, pt. reports MD has not found a need for treatment    Hypothyroidism    Sleep difficulties    had sleep study -2009, prior to gastric surgery, told that there was not a need for f/u   Thyroid disease    Vitamin B 12 deficiency    Past Surgical History:  Procedure Laterality Date   ABDOMINAL HYSTERECTOMY     BARIATRIC SURGERY     CARDIAC CATHETERIZATION     [facility]- 30 yrs. ago   CATARACT EXTRACTION, BILATERAL     late 2018   LEFT HEART CATH AND CORONARY ANGIOGRAPHY N/A 04/02/2018   Procedure: LEFT HEART CATH AND CORONARY ANGIOGRAPHY;  Surgeon: Martinique, Peter M, MD;  Location: [facility];  Service: Cardiovascular;  Laterality: N/A;   OOPHORECTOMY     pantallor arthrodesis with rod placement left foot     TONSILLECTOMY     TOTAL KNEE ARTHROPLASTY Left 11/30/2018    Procedure: TOTAL KNEE ARTHROPLASTY;  Surgeon: Gaynelle Arabian, MD;  Location: [facility];  Service: Orthopedics;  Laterality: Left;   TOTAL KNEE ARTHROPLASTY Right 06/28/2019   Procedure: TOTAL KNEE ARTHROPLASTY;  Surgeon: Gaynelle Arabian, MD;  Location: [facility];  Service: Orthopedics;  Laterality: Right;  15min   TOTAL SHOULDER ARTHROPLASTY Left 03/12/2012   Procedure: TOTAL SHOULDER ARTHROPLASTY;  Surgeon: Marin Shutter, MD;  Location: [facility];  Service: Orthopedics;  Laterality: Left;   Family History  Problem Relation Age of Onset   Stroke Mother    Liver cancer Father    Cancer Father        liver   Breast cancer Neg Hx    Social History   Socioeconomic History   Marital status: Widowed    Spouse name: Not on file   Number of children: Not on file  Years of education: Not on file   Highest education level: Not on file  Occupational History   Occupation: retired Optician, dispensing: RETIRED  Tobacco Use   Smoking status: Former    Packs/day: 0.70    Years: 20.00    Pack years: 14.00    Types: Cigarettes    Quit date: 03/04/1978    Years since quitting: 42.9   Smokeless tobacco: Never  Vaping Use   Vaping Use: Never used  Substance and Sexual Activity   Alcohol use: Yes    Alcohol/week: 2.0 standard drinks    Types: 2 Glasses of wine per week    Comment: with dinner  or social rare -    Drug use: No   Sexual activity: Not Currently  Other Topics Concern   Not on file  Social History Narrative   Widowed 2013. 2 kids. 4 grandkids. Oldest daughter lives in [place] and has 2 grandkids that are now in Scientist, physiological, PT school at [employer].       Retired-RN in operating room at [employer]: play cards with group, active with Church ([place])   Social Determinants of Health   Financial Resource Strain: Low Risk    Difficulty of Paying Living Expenses: Not hard at all  Food Insecurity: No Food Insecurity   Worried About Charity fundraiser  in the Last Year: Never true   Arboriculturist in the Last Year: Never true  Transportation Needs: No Transportation Needs   Lack of Transportation (Medical): No   Lack of Transportation (Non-Medical): No  Physical Activity: Inactive   Days of Exercise per Week: 0 days   Minutes of Exercise per Session: 0 min  Stress: No Stress Concern Present   Feeling of Stress : Not at all  Social Connections: Moderately Isolated   Frequency of Communication with Friends and Family: Three times a week   Frequency of Social Gatherings with Friends and Family: Three times a week   Attends Religious Services: More than 4 times per year   Active Member of Clubs or Organizations: No   Attends Archivist Meetings: Never   Marital Status: Widowed    Tobacco Counseling Counseling given: Not Answered   Clinical Intake:  Pre-visit preparation completed: Yes  Pain : No/denies pain     BMI - recorded: 42.61 Nutritional Status: BMI > 30  Obese Nutritional Risks: None Diabetes: Yes CBG done?: No Did pt. bring in CBG monitor from home?: No  How often do you need to have someone help you when you read instructions, pamphlets, or other written materials from your doctor or pharmacy?: 1 - Never  Diabetic?Nutrition Risk Assessment:  Has the patient had any N/V/D within the last 2 months?  No  Does the patient have any non-healing wounds?  No  Has the patient had any unintentional weight loss or weight gain?  No   Diabetes:  Is the patient diabetic?  Yes  If diabetic, was a CBG obtained today?  No  Did the patient bring in their glucometer from home?  No  How often do you monitor your CBG's? N/A  Financial Strains and Diabetes Management:  Are you having any financial strains with the device, your supplies or your medication? No .  Does the patient want to be seen by Chronic Care Management for management of their diabetes?  No  Would the patient like to be referred  to a Nutritionist  or for Diabetic Management?  No   Diabetic Exams:  Diabetic Eye Exam: Completed 12/27/20 Diabetic Foot Exam: Overdue, Pt has been advised about the importance in completing this exam. Pt is scheduled for diabetic foot exam on next appt .   Interpreter Needed?: No  Information entered by :: Charlott Rakes, LPN   Activities of Daily Living In your present state of health, do you have any difficulty performing the following activities: 02/12/2021 07/03/2020  Hearing? N N  Vision? N N  Difficulty concentrating or making decisions? N N  Walking or climbing stairs? Y N  Dressing or bathing? N N  Doing errands, shopping? N N  Preparing Food and eating ? N -  Using the Toilet? N -  In the past six months, have you accidently leaked urine? Y -  Comment wears a pad at night -  Do you have problems with loss of bowel control? N -  Managing your Medications? N -  Managing your Finances? N -  Housekeeping or managing your Housekeeping? N -  Some recent data might be hidden    Patient Care Team: Marin Olp, MD as PCP - General (Family Medicine) Belva Crome, MD as PCP - Cardiology (Cardiology) Clarene Essex, MD as Consulting Physician (Gastroenterology) Altheimer, Legrand Como, MD as Referring Physician (Endocrinology) Luberta Mutter, MD as Consulting Physician (Ophthalmology) Gaynelle Arabian, MD as Consulting Physician (Orthopedic Surgery) Johnathan Hausen, MD as Consulting Physician (General Surgery) Raynelle Bring, MD as Consulting Physician (Urology) Rozetta Nunnery, MD (Inactive) as Consulting Physician (Otolaryngology) Gaynelle Arabian, MD as Consulting Physician (Orthopedic Surgery) Edythe Clarity, [facility] as Pharmacist (Pharmacist)  Indicate any recent Medical Services you may have received from other than [facility] providers in the past year (date may be approximate).     Assessment:   This is a routine wellness examination for [place].  Hearing/Vision screen Hearing  Screening - Comments:: Pt denies any hearing issues  Vision Screening - Comments:: Pt follows up with Dr Ellie Lunch for annual eye exams   Dietary issues and exercise activities discussed: Current Exercise Habits: The patient does not participate in regular exercise at present   Goals Addressed             This Visit's Progress    Patient Stated       Lose weight        Depression Screen PHQ 2/9 Scores 02/12/2021 11/16/2020 11/16/2020 07/03/2020 07/03/2020 03/09/2020 09/03/2019  PHQ - 2 Score 0 0 0 0 0 3 0  PHQ- 9 Score - 8 - 0 - 13 7    Fall Risk Fall Risk  02/12/2021 11/16/2020 03/09/2020 04/02/2019 03/01/2019  Falls in the past year? 0 0 0 1 1  Number falls in past yr: 0 0 0 0 0  Injury with Fall? 0 0 0 1 1  Risk Factor Category  - - - - -  Risk for fall due to : Impaired vision;Impaired balance/gait - - History of fall(s);Impaired mobility;Orthopedic patient History of fall(s);Impaired balance/gait  Follow up Falls prevention discussed Falls evaluation completed - Falls evaluation completed;Education provided;Falls prevention discussed Education provided    FALL RISK PREVENTION PERTAINING TO THE HOME:  Any stairs in or around the home? Yes  If so, are there any without handrails? Yes  Home free of loose throw rugs in walkways, pet beds, electrical cords, etc? Yes  Adequate lighting in your home to reduce risk of falls? Yes   ASSISTIVE DEVICES UTILIZED TO PREVENT  FALLS:  Life alert? No  Use of a cane, walker or w/c? Yes  Grab bars in the bathroom? Yes  Shower chair or bench in shower? Yes  Elevated toilet seat or a handicapped toilet? No   TIMED UP AND GO:  Was the test performed? No .   Cognitive Function: MMSE - Mini Mental State Exam 05/02/2016  Not completed: (No Data)     6CIT Screen 02/12/2021 04/02/2019  What Year? 0 points 0 points  What month? 0 points 0 points  What time? 0 points 0 points  Count back from 20 0 points 0 points  Months in reverse 0 points 0  points  Repeat phrase 0 points 0 points  Total Score 0 0    Immunizations Immunization History  Administered Date(s) Administered   Fluad Quad(high Dose 65+) 09/22/2018, 10/19/2019, 10/07/2020   Influenza Split 10/22/2010, 11/14/2011   Influenza Whole 11/13/2006, 10/27/2008   Influenza, High Dose Seasonal PF 10/28/2014, 09/28/2015, 10/18/2016, 11/19/2017   Influenza,inj,Quad PF,6+ Mos 10/05/2012, 10/05/2013   Influenza-Unspecified 10/21/2013   PFIZER Comirnaty(Gray Top)Covid-19 Tri-Sucrose Vaccine 10/07/2020   PFIZER(Purple Top)SARS-COV-2 Vaccination 02/11/2019, 03/04/2019, 09/04/2019, 03/11/2020   Pneumococcal Conjugate-13 03/01/2013, 10/21/2013   Pneumococcal Polysaccharide-23 05/31/2006   Td 11/24/1998, 03/01/2013   Zoster Recombinat (Shingrix) 10/31/2016, 05/01/2017   Zoster, Live 06/20/2006    TDAP status: Up to date  Flu Vaccine status: Up to date  Pneumococcal vaccine status: Up to date  Covid-19 vaccine status: Completed vaccines  Qualifies for Shingles Vaccine? Yes   Zostavax completed Yes   Shingrix Completed?: Yes  Screening Tests Health Maintenance  Topic Date Due   FOOT EXAM  05/30/2020   COVID-19 Vaccine (6 - Booster for Pfizer series) 12/02/2020   HEMOGLOBIN A1C  05/17/2021   URINE MICROALBUMIN  07/11/2021   OPHTHALMOLOGY EXAM  12/27/2021   TETANUS/TDAP  03/02/2023   Pneumonia Vaccine 80+ Years old  Completed   INFLUENZA VACCINE  Completed   DEXA SCAN  Completed   Zoster Vaccines- Shingrix  Completed   HPV VACCINES  Aged Out    Health Maintenance  Health Maintenance Due  Topic Date Due   FOOT EXAM  05/30/2020   COVID-19 Vaccine (6 - Booster for [place] series) 12/02/2020    Colorectal cancer screening: No longer required.   Mammogram status: Completed 11/06/16. Repeat every year  Bone Density status: Completed 10/24/20. Results reflect: Bone density results: OSTEOPOROSIS. Repeat every 2 years.   Additional Screening:    Vision  Screening: Recommended annual ophthalmology exams for early detection of glaucoma and other disorders of the eye. Is the patient up to date with their annual eye exam?  Yes  Who is the provider or what is the name of the office in which the patient attends annual eye exams? Dr Ellie Lunch  If pt is not established with a provider, would they like to be referred to a provider to establish care? No .   Dental Screening: Recommended annual dental exams for proper oral hygiene  Community Resource Referral / Chronic Care Management: CRR required this visit?  No   CCM required this visit?  No      Plan:     I have personally reviewed and noted the following in the patients chart:   Medical and social history Use of alcohol, tobacco or illicit drugs  Current medications and supplements including opioid prescriptions.  Functional ability and status Nutritional status Physical activity Advanced directives List of other physicians Hospitalizations, surgeries, and ER visits in previous 79  months Vitals Screenings to include cognitive, depression, and falls Referrals and appointments  In addition, I have reviewed and discussed with patient certain preventive protocols, quality metrics, and best practice recommendations. A written personalized care plan for preventive services as well as general preventive health recommendations were provided to patient.     Willette Brace, LPN   10/09/8020   Nurse Notes: None

## 2021-02-12 NOTE — Patient Instructions (Addendum)
[name] , Thank you for taking time to come for your Medicare Wellness Visit. I appreciate your ongoing commitment to your health goals. Please review the following plan we discussed and let me know if I can assist you in the future.   Screening recommendations/referrals: Colonoscopy: no longer required  Mammogram: No longer required  Bone Density: Done 10/24/20 repeat every 2 years  Recommended yearly ophthalmology/optometry visit for glaucoma screening and checkup Recommended yearly dental visit for hygiene and checkup  Vaccinations: Influenza vaccine: Done 10/07/20 Pneumococcal vaccine: Up to date Tdap vaccine: Done 03/01/13 repeat every 10 years  Shingles vaccine: Completed 10/31/16 & 05/01/17    Covid-19:Completed 1/21, 2/11, 09/04/19 & 2/19, 10/07/20  Advanced directives: Please bring a copy of your health care power of attorney and living will to the office at your convenience.  Conditions/risks identified: Lose weight   Next appointment: Follow up in one year for your annual wellness visit    Preventive Care 65 Years and Older, Female Preventive care refers to lifestyle choices and visits with your health care provider that can promote health and wellness. What does preventive care include? A yearly physical exam. This is also called an annual well check. Dental exams once or twice a year. Routine eye exams. Ask your health care provider how often you should have your eyes checked. Personal lifestyle choices, including: Daily care of your teeth and gums. Regular physical activity. Eating a healthy diet. Avoiding tobacco and drug use. Limiting alcohol use. Practicing safe sex. Taking low-dose aspirin every day. Taking vitamin and mineral supplements as recommended by your health care provider. What happens during an annual well check? The services and screenings done by your health care provider during your annual well check will depend on your age, overall health,  lifestyle risk factors, and family history of disease. Counseling  Your health care provider may ask you questions about your: Alcohol use. Tobacco use. Drug use. Emotional well-being. Home and relationship well-being. Sexual activity. Eating habits. History of falls. Memory and ability to understand (cognition). Work and work Statistician. Reproductive health. Screening  You may have the following tests or measurements: Height, weight, and BMI. Blood pressure. Lipid and cholesterol levels. These may be checked every 5 years, or more frequently if you are over 36 years old. Skin check. Lung cancer screening. You may have this screening every year starting at age 81 if you have a 30-pack-year history of smoking and currently smoke or have quit within the past 15 years. Fecal occult blood test (FOBT) of the stool. You may have this test every year starting at age 81 Flexible sigmoidoscopy or colonoscopy. You may have a sigmoidoscopy every 5 years or a colonoscopy every 10 years starting at age 81 Hepatitis C blood test. Hepatitis B blood test. Sexually transmitted disease (STD) testing. Diabetes screening. This is done by checking your blood sugar (glucose) after you have not eaten for a while (fasting). You may have this done every 1-3 years. Bone density scan. This is done to screen for osteoporosis. You may have this done starting at age 81 Mammogram. This may be done every 1-2 years. Talk to your health care provider about how often you should have regular mammograms. Talk with your health care provider about your test results, treatment options, and if necessary, the need for more tests. Vaccines  Your health care provider may recommend certain vaccines, such as: Influenza vaccine. This is recommended every year. Tetanus, diphtheria, and acellular pertussis (Tdap, Td) vaccine. You  may need a Td booster every 10 years. Zoster vaccine. You may need this after age  81. Pneumococcal 13-valent conjugate (PCV13) vaccine. One dose is recommended after age 81. Pneumococcal polysaccharide (PPSV23) vaccine. One dose is recommended after age 81. Talk to your health care provider about which screenings and vaccines you need and how often you need them. This information is not intended to replace advice given to you by your health care provider. Make sure you discuss any questions you have with your health care provider. Document Released: 02/03/2015 Document Revised: 09/27/2015 Document Reviewed: 11/08/2014 Elsevier Interactive Patient Education  2017 [place] Prevention in the Home Falls can cause injuries. They can happen to people of all ages. There are many things you can do to make your home safe and to help prevent falls. What can I do on the outside of my home? Regularly fix the edges of walkways and driveways and fix any cracks. Remove anything that might make you trip as you walk through a door, such as a raised step or threshold. Trim any bushes or trees on the path to your home. Use bright outdoor lighting. Clear any walking paths of anything that might make someone trip, such as rocks or tools. Regularly check to see if handrails are loose or broken. Make sure that both sides of any steps have handrails. Any raised decks and porches should have guardrails on the edges. Have any leaves, snow, or ice cleared regularly. Use sand or salt on walking paths during winter. Clean up any spills in your garage right away. This includes oil or grease spills. What can I do in the bathroom? Use night lights. Install grab bars by the toilet and in the tub and shower. Do not use towel bars as grab bars. Use non-skid mats or decals in the tub or shower. If you need to sit down in the shower, use a plastic, non-slip stool. Keep the floor dry. Clean up any water that spills on the floor as soon as it happens. Remove soap buildup in the tub or shower  regularly. Attach bath mats securely with double-sided non-slip rug tape. Do not have throw rugs and other things on the floor that can make you trip. What can I do in the bedroom? Use night lights. Make sure that you have a light by your bed that is easy to reach. Do not use any sheets or blankets that are too big for your bed. They should not hang down onto the floor. Have a firm chair that has side arms. You can use this for support while you get dressed. Do not have throw rugs and other things on the floor that can make you trip. What can I do in the kitchen? Clean up any spills right away. Avoid walking on wet floors. Keep items that you use a lot in easy-to-reach places. If you need to reach something above you, use a strong step stool that has a grab bar. Keep electrical cords out of the way. Do not use floor polish or wax that makes floors slippery. If you must use wax, use non-skid floor wax. Do not have throw rugs and other things on the floor that can make you trip. What can I do with my stairs? Do not leave any items on the stairs. Make sure that there are handrails on both sides of the stairs and use them. Fix handrails that are broken or loose. Make sure that handrails are as long as the  stairways. Check any carpeting to make sure that it is firmly attached to the stairs. Fix any carpet that is loose or worn. Avoid having throw rugs at the top or bottom of the stairs. If you do have throw rugs, attach them to the floor with carpet tape. Make sure that you have a light switch at the top of the stairs and the bottom of the stairs. If you do not have them, ask someone to add them for you. What else can I do to help prevent falls? Wear shoes that: Do not have high heels. Have rubber bottoms. Are comfortable and fit you well. Are closed at the toe. Do not wear sandals. If you use a stepladder: Make sure that it is fully opened. Do not climb a closed stepladder. Make sure that  both sides of the stepladder are locked into place. Ask someone to hold it for you, if possible. Clearly mark and make sure that you can see: Any grab bars or handrails. First and last steps. Where the edge of each step is. Use tools that help you move around (mobility aids) if they are needed. These include: Canes. Walkers. Scooters. Crutches. Turn on the lights when you go into a dark area. Replace any light bulbs as soon as they burn out. Set up your furniture so you have a clear path. Avoid moving your furniture around. If any of your floors are uneven, fix them. If there are any pets around you, be aware of where they are. Review your medicines with your doctor. Some medicines can make you feel dizzy. This can increase your chance of falling. Ask your doctor what other things that you can do to help prevent falls. This information is not intended to replace advice given to you by your health care provider. Make sure you discuss any questions you have with your health care provider. Document Released: 11/03/2008 Document Revised: 06/15/2015 Document Reviewed: 02/11/2014 Elsevier Interactive Patient Education  2017 [employer].

## 2021-02-16 ENCOUNTER — Other Ambulatory Visit: Payer: Self-pay

## 2021-02-16 ENCOUNTER — Encounter: Payer: Self-pay | Admitting: Family Medicine

## 2021-02-16 ENCOUNTER — Ambulatory Visit (INDEPENDENT_AMBULATORY_CARE_PROVIDER_SITE_OTHER): Payer: Medicare HMO | Admitting: Family Medicine

## 2021-02-16 VITALS — BP 138/86 | HR 66 | Temp 97.9°F | Ht 61.0 in | Wt 226.4 lb

## 2021-02-16 DIAGNOSIS — I350 Nonrheumatic aortic (valve) stenosis: Secondary | ICD-10-CM

## 2021-02-16 DIAGNOSIS — E039 Hypothyroidism, unspecified: Secondary | ICD-10-CM | POA: Diagnosis not present

## 2021-02-16 DIAGNOSIS — E210 Primary hyperparathyroidism: Secondary | ICD-10-CM

## 2021-02-16 DIAGNOSIS — F3342 Major depressive disorder, recurrent, in full remission: Secondary | ICD-10-CM | POA: Diagnosis not present

## 2021-02-16 DIAGNOSIS — I700 Atherosclerosis of aorta: Secondary | ICD-10-CM | POA: Diagnosis not present

## 2021-02-16 DIAGNOSIS — E538 Deficiency of other specified B group vitamins: Secondary | ICD-10-CM

## 2021-02-16 DIAGNOSIS — E119 Type 2 diabetes mellitus without complications: Secondary | ICD-10-CM

## 2021-02-16 DIAGNOSIS — E1169 Type 2 diabetes mellitus with other specified complication: Secondary | ICD-10-CM

## 2021-02-16 DIAGNOSIS — E785 Hyperlipidemia, unspecified: Secondary | ICD-10-CM

## 2021-02-16 DIAGNOSIS — I2510 Atherosclerotic heart disease of native coronary artery without angina pectoris: Secondary | ICD-10-CM

## 2021-02-16 DIAGNOSIS — I10 Essential (primary) hypertension: Secondary | ICD-10-CM

## 2021-02-16 NOTE — Patient Instructions (Addendum)
Schedule labs for Monday  No changes today  Recommended follow up: Return in about 4 months (around 06/16/2021) for follow up- or sooner if needed.

## 2021-02-19 ENCOUNTER — Other Ambulatory Visit: Payer: Self-pay

## 2021-02-19 ENCOUNTER — Other Ambulatory Visit (INDEPENDENT_AMBULATORY_CARE_PROVIDER_SITE_OTHER): Payer: Medicare HMO

## 2021-02-19 DIAGNOSIS — E039 Hypothyroidism, unspecified: Secondary | ICD-10-CM | POA: Diagnosis not present

## 2021-02-19 DIAGNOSIS — E538 Deficiency of other specified B group vitamins: Secondary | ICD-10-CM | POA: Diagnosis not present

## 2021-02-19 DIAGNOSIS — E119 Type 2 diabetes mellitus without complications: Secondary | ICD-10-CM

## 2021-02-19 LAB — COMPREHENSIVE METABOLIC PANEL
ALT: 14 U/L (ref 0–35)
AST: 18 U/L (ref 0–37)
Albumin: 4 g/dL (ref 3.5–5.2)
Alkaline Phosphatase: 110 U/L (ref 39–117)
BUN: 11 mg/dL (ref 6–23)
CO2: 27 mEq/L (ref 19–32)
Calcium: 9.8 mg/dL (ref 8.4–10.5)
Chloride: 102 mEq/L (ref 96–112)
Creatinine, Ser: 0.99 mg/dL (ref 0.40–1.20)
GFR: 53.69 mL/min — ABNORMAL LOW (ref >60.00)
Glucose, Bld: 150 mg/dL — ABNORMAL HIGH (ref 70–99)
Potassium: 4.3 mEq/L (ref 3.5–5.1)
Sodium: 137 mEq/L (ref 135–145)
Total Bilirubin: 0.5 mg/dL (ref 0.2–1.2)
Total Protein: 6.2 g/dL (ref 6.0–8.3)

## 2021-02-19 LAB — CBC WITH DIFFERENTIAL/PLATELET
Basophils Absolute: 0 10*3/uL (ref 0.0–0.1)
Basophils Relative: 0.8 % (ref 0.0–3.0)
Eosinophils Absolute: 0.5 10*3/uL (ref 0.0–0.7)
Eosinophils Relative: 9 % — ABNORMAL HIGH (ref 0.0–5.0)
HCT: 41.7 % (ref 36.0–46.0)
Hemoglobin: 13.8 g/dL (ref 12.0–15.0)
Lymphocytes Relative: 22.2 % (ref 12.0–46.0)
Lymphs Abs: 1.2 10*3/uL (ref 0.7–4.0)
MCHC: 33.1 g/dL (ref 30.0–36.0)
MCV: 94.6 fl (ref 78.0–100.0)
Monocytes Absolute: 0.5 10*3/uL (ref 0.1–1.0)
Monocytes Relative: 9.4 % (ref 3.0–12.0)
Neutro Abs: 3.3 10*3/uL (ref 1.4–7.7)
Neutrophils Relative %: 58.6 % (ref 43.0–77.0)
Platelets: 194 10*3/uL (ref 150.0–400.0)
RBC: 4.41 Mil/uL (ref 3.87–5.11)
RDW: 14.9 % (ref 11.5–15.5)
WBC: 5.6 10*3/uL (ref 4.0–10.5)

## 2021-02-19 LAB — HEMOGLOBIN A1C: Hgb A1c MFr Bld: 6.7 % — ABNORMAL HIGH (ref 4.6–6.5)

## 2021-02-19 LAB — VITAMIN B12: Vitamin B-12: 464 pg/mL (ref 211–911)

## 2021-02-19 LAB — T4, FREE: Free T4: 1.42 ng/dL (ref 0.60–1.60)

## 2021-02-19 LAB — TSH: TSH: 0.33 u[IU]/mL — ABNORMAL LOW (ref 0.35–5.50)

## 2021-02-27 ENCOUNTER — Encounter (HOSPITAL_COMMUNITY): Payer: Self-pay

## 2021-02-27 NOTE — Progress Notes (Addendum)
COVID swab appointment: N/A  COVID Vaccine Completed: Yes x2  Date COVID Vaccine completed: 02-11-19 03-04-19 Has received booster: Yes x3 09-04-19 03-11-20 11-06-20 COVID vaccine manufacturer: [place]      Date of COVID positive in last 90 days:  No  PCP - Garret Reddish, MD Cardiologist - Daneen Schick, MD  Chest x-ray - N/A EKG - 10-26-20 Epic Stress Test - 08-30-19 Epic ECHO - 10-31-20 Epic Cardiac Cath - greater than 2 years Epic Pacemaker/ICD device last checked: Spinal Cord Stimulator: CT cardiac score - 2019 Epic  Bowel Prep - N/A  Sleep Study - Yes, neg sleep apnea CPAP - No  Fasting Blood Sugar -  Checks Blood Sugar - does not check   Blood Thinner Instructions: Aspirin Instructions:  ASA 81 mg.  To stop a week before  Last Dose:  Activity level:   Unable to climb stairs due to SOB.  Can perform activities of daily living without stopping and without symptoms of chest pain.  Patient does have shortness of breath with exertion but this has not worsened.  Patient lives alone.    Anesthesia review:  CAD, mild aortic steniosis, murmur, HTN, DM. Chronic shortness of breath  Patient denies shortness of breath, fever, cough and chest pain at PAT appointment  Patient verbalized understanding of instructions that were given to them at the PAT appointment. Patient was also instructed that they will need to review over the PAT instructions again at home before surgery.

## 2021-02-27 NOTE — Patient Instructions (Addendum)
DUE TO COVID-19 ONLY ONE VISITOR IS ALLOWED TO COME WITH YOU AND STAY IN THE WAITING ROOM ONLY DURING PRE OP AND PROCEDURE.   **NO VISITORS ARE ALLOWED IN THE SHORT STAY AREA OR RECOVERY ROOM!!**   You are not required to quarantine, however you are required to wear a well-fitted mask when you are out and around people not in your household.  Hand Hygiene often Do NOT share personal items Notify your provider if you are in close contact with someone who has COVID or you develop fever 100.4 or greater, new onset of sneezing, cough, sore throat, shortness of breath or body aches.        Your procedure is scheduled on: Friday, 03-16-21   Report to [facility] Main  Entrance     Report to admitting at 5:15 AM   Call this number if you have problems the morning of surgery [phone]   Do not eat food :After Midnight.   May have liquids until 4:30 AM day of surgery  CLEAR LIQUID DIET  Foods Allowed                                                                     Foods Excluded  Water, Black Coffee (no milk/no creamer) and tea, regular and decaf                              liquids that you cannot  Plain Jell-O in any flavor  (No red)                         see through such as: Fruit ices (not with fruit pulp)                                 milk, soups, orange juice  Iced Popsicles (No red)                                    All solid food                             Apple juices Sports drinks like Gatorade (No red) Lightly seasoned clear broth or consume(fat free) Sugar    Oral Hygiene is also important to reduce your risk of infection.                                    Remember - BRUSH YOUR TEETH THE MORNING OF SURGERY WITH YOUR REGULAR TOOTHPASTE   Do NOT smoke after Midnight   Take these medicines the morning of surgery with A SIP OF WATER:  Tylenol, Pristiq, Allegra, Levothyroxine, Prednisone  DO NOT TAKE ANY ORAL DIABETIC MEDICATIONS DAY OF YOUR SURGERY                 Stop all vitamins and herbal supplements a week before surgery.     Stop Motrin, Aleve, Ibuprofen a  week before surgery.             You may not have any metal on your body including hair pins, jewelry, and body piercing             Do not wear make-up, lotions, powders, perfumes/ or deodorant  Do not wear nail polish including gel and S&S, artificial/acrylic nails, or any other type of covering on natural nails including finger and toenails. If you have artificial nails, gel coating, etc. that needs to be removed by a nail salon please have this removed prior to surgery or surgery may need to be canceled/ delayed if the surgeon/ anesthesia feels like they are unable to be safely monitored.   Do not shave  48 hours prior to surgery.       Contacts, dentures or bridgework may not be worn into surgery.   Patients discharged the day of surgery will not be allowed to drive home.   A responsible adult must remain with you for 24 hours after surgery.  Special Instructions: Bring a copy of your healthcare power of attorney and living will documents the day of surgery if you haven't scanned them in before.  Please read over the following fact sheets you were given: IF YOU HAVE QUESTIONS ABOUT YOUR PRE OP INSTRUCTIONS PLEASE CALL [facility] - Preparing for Surgery Before surgery, you can play an important role.  Because skin is not sterile, your skin needs to be as free of germs as possible.  You can reduce the number of germs on your skin by washing with CHG (chlorahexidine gluconate) soap before surgery.  CHG is an antiseptic cleaner which kills germs and bonds with the skin to continue killing germs even after washing. Please DO NOT use if you have an allergy to CHG or antibacterial soaps.  If your skin becomes reddened/irritated stop using the CHG and inform your nurse when you arrive at Short Stay. Do not shave (including legs and underarms) for at least 48  hours prior to the first CHG shower.  You may shave your face/neck.  Please follow these instructions carefully:  1.  Shower with CHG Soap the night before surgery and the  morning of surgery.  2.  If you choose to wash your hair, wash your hair first as usual with your normal  shampoo.  3.  After you shampoo, rinse your hair and body thoroughly to remove the shampoo.                             4.  Use CHG as you would any other liquid soap.  You can apply chg directly to the skin and wash.  Gently with a scrungie or clean washcloth.  5.  Apply the CHG Soap to your body ONLY FROM THE NECK DOWN.   Do   not use on face/ open                           Wound or open sores. Avoid contact with eyes, ears mouth and   genitals (private parts).                       Wash face,  Genitals (private parts) with your normal soap.             6.  Wash thoroughly, paying special attention to  the area where your    surgery  will be performed.  7.  Thoroughly rinse your body with warm water from the neck down.  8.  DO NOT shower/wash with your normal soap after using and rinsing off the CHG Soap.                9.  Pat yourself dry with a clean towel.            10.  Wear clean pajamas.            11.  Place clean sheets on your bed the night of your first shower and do not  sleep with pets. Day of Surgery : Do not apply any lotions/deodorants the morning of surgery.  Please wear clean clothes to the hospital/surgery center.  FAILURE TO FOLLOW THESE INSTRUCTIONS MAY RESULT IN THE CANCELLATION OF YOUR SURGERY  PATIENT SIGNATURE_________________________________  NURSE SIGNATURE__________________________________  ________________________________________________________________________

## 2021-03-01 ENCOUNTER — Encounter (HOSPITAL_COMMUNITY): Payer: Self-pay

## 2021-03-01 ENCOUNTER — Encounter (HOSPITAL_COMMUNITY)
Admission: RE | Admit: 2021-03-01 | Discharge: 2021-03-01 | Disposition: A | Discharge status: Home / Self Care | Payer: Medicare HMO | Source: Ambulatory Visit | Attending: Surgery | Admitting: Surgery

## 2021-03-01 ENCOUNTER — Other Ambulatory Visit: Payer: Self-pay

## 2021-03-01 DIAGNOSIS — I2510 Atherosclerotic heart disease of native coronary artery without angina pectoris: Secondary | ICD-10-CM | POA: Diagnosis not present

## 2021-03-01 DIAGNOSIS — E210 Primary hyperparathyroidism: Secondary | ICD-10-CM | POA: Insufficient documentation

## 2021-03-01 DIAGNOSIS — I10 Essential (primary) hypertension: Secondary | ICD-10-CM | POA: Diagnosis not present

## 2021-03-01 DIAGNOSIS — Z87891 Personal history of nicotine dependence: Secondary | ICD-10-CM | POA: Diagnosis not present

## 2021-03-01 DIAGNOSIS — I350 Nonrheumatic aortic (valve) stenosis: Secondary | ICD-10-CM | POA: Diagnosis not present

## 2021-03-01 DIAGNOSIS — E119 Type 2 diabetes mellitus without complications: Secondary | ICD-10-CM | POA: Diagnosis not present

## 2021-03-01 DIAGNOSIS — Z01812 Encounter for preprocedural laboratory examination: Secondary | ICD-10-CM | POA: Insufficient documentation

## 2021-03-01 HISTORY — DX: Fibromyalgia: M79.7

## 2021-03-01 HISTORY — DX: Cardiac murmur, unspecified: R01.1

## 2021-03-01 HISTORY — DX: Anxiety disorder, unspecified: F41.9

## 2021-03-01 HISTORY — DX: Hyperparathyroidism, unspecified: E21.3

## 2021-03-01 HISTORY — DX: Polymyalgia rheumatica: M35.3

## 2021-03-01 LAB — GLUCOSE, CAPILLARY: Glucose-Capillary: 126 mg/dL — ABNORMAL HIGH (ref 70–99)

## 2021-03-06 NOTE — Progress Notes (Signed)
Anesthesia Chart Review   Case: [id number] Date/Time: 03/16/21 0715   Procedure: LEFT INFERIOR PARATHYROIDECTOMY (Left)   Anesthesia type: General   Pre-op diagnosis: PRIMARY HYPERPARATHYROIDISM   Location: [facility] ROOM 01 / [facility]   Surgeons: Armandina Gemma, MD       DISCUSSION:81 y.o. former smoker with h/o DM II, HTN, CAD, mild aortic valve stenosis, primary hyperparathyroidism scheduled for above procedure 03/16/2021 with Dr. Armandina Gemma.   Last seen by cardiology 10/26/2020, stable at this visit with 12 month follow up recommended.   Anticipate pt can proceed with planned procedure barring acute status change.   VS: BP (!) 169/84    Pulse 67    Temp 37 C (Oral)    Resp 18    Ht 5\' 1"  (1.549 m)    Wt 99.9 kg    SpO2 97%    BMI 41.61 kg/m   PROVIDERS: Marin Olp, MD is PCP   Daneen Schick, MD is Cardiologist  LABS: Labs reviewed: Acceptable for surgery. (all labs ordered are listed, but only abnormal results are displayed)  Labs Reviewed  GLUCOSE, CAPILLARY - Abnormal; Notable for the following components:      Result Value   Glucose-Capillary 126 (*)    All other components within normal limits     IMAGES:   EKG: 10/26/2020 Rate 68 bpm  NSR  CV: Echo 10/31/20 1. The aortic valve is tricuspid. There is mild calcification of the  aortic valve. There is mild thickening of the aortic valve. Aortic valve  regurgitation is not visualized. Mild aortic valve stenosis. Aortic valve  area, by VTI measures 1.66 cm.  Aortic valve mean gradient measures 10.5 mmHg. Aortic valve Vmax measures  2.29 m/s.   2. Left ventricular ejection fraction, by estimation, is 60 to 65%. The  left ventricle has normal function. The left ventricle has no regional  wall motion abnormalities. There is mild concentric left ventricular  hypertrophy. Left ventricular diastolic  parameters are consistent with Grade I diastolic dysfunction (impaired  relaxation).   3. Right ventricular systolic  function is normal. The right ventricular  size is normal. There is normal pulmonary artery systolic pressure. The  estimated right ventricular systolic pressure is 25.0 mmHg.   4. The mitral valve is grossly normal. Trivial mitral valve  regurgitation. No evidence of mitral stenosis.   5. The inferior vena cava is normal in size with greater than 50%  respiratory variability, suggesting right atrial pressure of 3 mmHg.  Myocardial perfusion 08/30/2019 There was no ST segment deviation noted during stress. This is a low risk study. The left ventricular ejection fraction is normal (55-65%).   There is mild apical thinning in both stress and rest images, but there is no evidence of ischemia. Normal EF and wall motion. Low risk study.  Cardiac Cath 04/02/2018 Mid RCA lesion is 70% stenosed. Dist Cx-1 lesion is 65% stenosed. Dist Cx-2 lesion is 70% stenosed. 1st Diag lesion is 75% stenosed. The left ventricular systolic function is normal. LV end diastolic pressure is normal. The left ventricular ejection fraction is 55-65% by visual estimate.   1. Modest CAD involving a small first diagonal, distal LCx and nondominant RCA 2. Normal LV function 3. Normal LVEDP    Myocardial Perfusion 01/02/2018 Nuclear stress EF: 65%. The left ventricular ejection fraction is normal (55-65%). The patient had an ejection of Lexiscan. There were no ST or T wave changes following the injection. This is a low risk study. There is no  evidence of ischemia and no evidence of previous infarction. Left ventricular systolic function is normal The study is normal. Past Medical History:  Diagnosis Date   Anemia    Anxiety    Arthritis    osteoarthritis - shoulder, knees & hips   Asthma    Coronary artery disease    Depression    Diabetes mellitus without complication (HCC)    no meds   Fibromyalgia    Heart murmur    Hyperparathyroidism ([place])    Hypertension    Dr. Orinda Kenner manages BP, pt. reports MD  has not found a need for treatment    Hypothyroidism    Polymyalgia rheumatica ([place])    Sleep difficulties    had sleep study -2009, prior to gastric surgery, told that there was not a need for f/u   Thyroid disease    Vitamin B 12 deficiency     Past Surgical History:  Procedure Laterality Date   ABDOMINAL HYSTERECTOMY     BARIATRIC SURGERY     CARDIAC CATHETERIZATION     [facility]- 30 yrs. ago   CATARACT EXTRACTION, BILATERAL     late 2018   LEFT HEART CATH AND CORONARY ANGIOGRAPHY N/A 04/02/2018   Procedure: LEFT HEART CATH AND CORONARY ANGIOGRAPHY;  Surgeon: Martinique, Peter M, MD;  Location: [facility];  Service: Cardiovascular;  Laterality: N/A;   OOPHORECTOMY     pantallor arthrodesis with rod placement left foot     TONSILLECTOMY     TOTAL KNEE ARTHROPLASTY Left 11/30/2018   Procedure: TOTAL KNEE ARTHROPLASTY;  Surgeon: Gaynelle Arabian, MD;  Location: [facility];  Service: Orthopedics;  Laterality: Left;   TOTAL KNEE ARTHROPLASTY Right 06/28/2019   Procedure: TOTAL KNEE ARTHROPLASTY;  Surgeon: Gaynelle Arabian, MD;  Location[facility];  Service: Orthopedics;  Laterality: Right;  63min   TOTAL SHOULDER ARTHROPLASTY Left 03/12/2012   Procedure: TOTAL SHOULDER ARTHROPLASTY;  Surgeon: Marin Shutter, MD;  Location: [facility];  Service: Orthopedics;  Laterality: Left;    MEDICATIONS:  acetaminophen (TYLENOL) 500 MG tablet   albuterol (VENTOLIN HFA) 108 (90 Base) MCG/ACT inhaler   amLODipine (NORVASC) 10 MG tablet   aspirin EC 81 MG tablet   Biotin 5000 MCG CAPS   Cholecalciferol (VITAMIN D-3) 125 MCG (5000 UT) TABS   cyanocobalamin (,VITAMIN B-12,) 1000 MCG/ML injection   desvenlafaxine (PRISTIQ) 100 MG 24 hr tablet   diclofenac (VOLTAREN) 75 MG EC tablet   Evolocumab (REPATHA SURECLICK) 314 MG/ML SOAJ   FeFum-FePo-FA-B Cmp-C-Zn-Mn-Cu (SE-TAN PLUS) 162-115.2-1 MG CAPS   fexofenadine (ALLEGRA) 180 MG tablet   fluticasone (FLONASE) 50 MCG/ACT nasal spray   folic acid (FOLVITE) 1 MG tablet    glucose blood test strip   Levothyroxine Sodium 150 MCG CAPS   loperamide (IMODIUM) 2 MG capsule   methotrexate 2.5 MG tablet   nitroGLYCERIN (NITROSTAT) 0.4 MG SL tablet   Omega-3 Fatty Acids (FISH OIL) 1000 MG CAPS   predniSONE 2 MG TBEC   Propylene Glycol (SYSTANE COMPLETE OP)   TRUEplus Lancets 30G MISC   No current facility-administered medications for this encounter.     Konrad Felix Ward, PA-C [facility] [phone]

## 2021-03-07 ENCOUNTER — Encounter: Payer: Self-pay | Admitting: Family Medicine

## 2021-03-08 ENCOUNTER — Other Ambulatory Visit: Payer: Self-pay | Admitting: *Deleted

## 2021-03-08 NOTE — Telephone Encounter (Signed)
Left message to return call to our office at their convenience.   Patient allergic to singulair 10mg ,  Ok to have OTC allegra Per Dr Jerline Pain

## 2021-03-08 NOTE — Telephone Encounter (Signed)
Yes it is ok to refill her singulair 10mg  once daily.   Algis Greenhouse. Jerline Pain, MD 03/08/2021 8:18 AM

## 2021-03-08 NOTE — Telephone Encounter (Signed)
Patient requesting a call from Dr Yong Channel  Stated she did not have reaction to Singulair  Only possible elevated her depression  Advise to get Allegra OTC  Rx singular not in patient current medication list

## 2021-03-09 ENCOUNTER — Other Ambulatory Visit: Payer: Self-pay | Admitting: *Deleted

## 2021-03-09 MED ORDER — MONTELUKAST SODIUM 10 MG PO TABS: 10.0000 mg | ORAL_TABLET | Freq: Every day | ORAL | 3 refills | Status: DC

## 2021-03-09 NOTE — Telephone Encounter (Signed)
Rx was send to patient pharmacy

## 2021-03-14 ENCOUNTER — Encounter (HOSPITAL_COMMUNITY): Payer: Self-pay | Admitting: Surgery

## 2021-03-14 DIAGNOSIS — E210 Primary hyperparathyroidism: Secondary | ICD-10-CM | POA: Diagnosis present

## 2021-03-14 NOTE — H&P (Signed)
REFERRING PHYSICIAN: Lanney Gins  PROVIDER: Alyna Stensland Charlotta Newton, MD  Chief Complaint: New Consultation (Primary hyperparathyroidism)   History of Present Illness:  Patient is referred by Dr. Daine Floras from endocrinology for surgical evaluation and recommendations regarding primary hyperparathyroidism. Patient's primary care physician is Dr. Garret Reddish. 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 Patient had been followed for several years by Dr. Legrand Como Altheimer for hypercalcemia. Levels had always been borderline. Patient has now developed osteoporosis. She does have chronic fatigue. She does have diminished renal function. She is therefore sent for evaluation for primary hyperparathyroidism. Her most recent calcium level was 10.5. Her most recent intact PTH level was 107. Her 25-hydroxy vitamin D level was normal at 43. She has not had a 24-hour urine collection for calcium. She has not had any imaging studies performed. She has had no prior surgery on the head or neck. There is no family history of primary hyperparathyroidism or other endocrine neoplasm. Patient does have a history of hypothyroidism.  Review of Systems: A complete review of systems was obtained from the patient. I have reviewed this information and discussed as appropriate with the patient. See HPI as well for other ROS.  Review of Systems  Constitutional: Positive for malaise/fatigue.  HENT: Negative.  Eyes: Negative.  Respiratory: Negative.  Cardiovascular: Negative.  Gastrointestinal: Negative.  Genitourinary: Negative.  Musculoskeletal: Positive for joint pain.  Skin: Negative.  Neurological: Negative.  Endo/Heme/Allergies: Negative.  Psychiatric/Behavioral: Negative.    Medical History: Past Medical History:  Diagnosis Date   Anxiety   Arthritis   Asthma, unspecified asthma severity,  unspecified whether complicated, unspecified whether persistent   Diabetes mellitus without complication (CMS-HCC)   Hypertension   Thyroid disease   Patient Active Problem List  Diagnosis   Primary hyperparathyroidism (CMS-HCC)   Past Surgical History:  Procedure Laterality Date   left ankle and foot 2008   REPLACEMENT TOTAL KNEE 2021   total left shoulder 2021    Allergies  Allergen Reactions   Cyclobenzaprine Other (See Comments) and Unknown  Not listed On Pristiq- possible serotonin reaction to flexeril On Pristiq- possible serotonin reaction to flexeril   Ace Inhibitors Angioedema, Other (See Comments) and Swelling  Possible angioedema during/after general anesthesia States after a surgery she was told to "not take" this class of drugs- not 100% sure what the reason was States after a surgery she was told to "not take" this class of drugs- not 100% sure what the reason was   Codeine Other (See Comments) and Nausea  REACTION: Upset stomach Upset stomach Upset stomach   Kiwi Other (See Comments)   Montelukast Other (See Comments)  Causes depression   Pristiq [Desvenlafaxine] Other (See Comments)   Azithromycin Nausea And Vomiting, Rash and Other (See Comments)  REACTION: Rash   No current outpatient medications on file prior to visit.   No current facility-administered medications on file prior to visit.   History reviewed. No pertinent family history.   Social History   Tobacco Use  Smoking Status Never  Smokeless Tobacco Never    Social History   Socioeconomic History   Marital status: Married  Tobacco Use   Smoking status: Never   Smokeless tobacco: Never  Substance and Sexual Activity   Alcohol use: Never   Drug use: Never   Objective:   Vitals:  BP: (!) 142/86  Pulse: 86  Temp: 36.7 C (98.1 F)  SpO2: 97%  Weight: (!) 103.3 kg (227 lb 12.8 oz)  Height: 154.9 cm (5\' 1" )   Body mass index is 43.04 kg/m.  Physical Exam   GENERAL  APPEARANCE Development: normal Nutritional status: normal Gross deformities: none  SKIN Rash, lesions, ulcers: none Induration, erythema: none Nodules: none palpable  EYES Conjunctiva and lids: normal Pupils: equal and reactive Iris: normal bilaterally  EARS, NOSE, MOUTH, THROAT External ears: no lesion or deformity External nose: no lesion or deformity Hearing: grossly normal Due to Covid-19 pandemic, patient is wearing a mask.  NECK Symmetric: yes Trachea: midline Thyroid: no palpable nodules in the thyroid bed  CHEST Respiratory effort: normal Retraction or accessory muscle use: no Breath sounds: normal bilaterally Rales, rhonchi, wheeze: none  CARDIOVASCULAR Auscultation: regular rhythm, normal rate Murmurs: none Pulses: radial pulse 2+ palpable Lower extremity edema: none  MUSCULOSKELETAL Station and gait: walks with a cane Digits and nails: no clubbing or cyanosis Deformity: none  LYMPHATIC Cervical: none palpable Supraclavicular: none palpable  PSYCHIATRIC Oriented to person, place, and time: yes Mood and affect: normal for situation Judgment and insight: appropriate for situation  Assessment and Plan:   Primary hyperparathyroidism (CMS-HCC)   Patient is referred by her endocrinologist, Dr. Daine Floras, for surgical evaluation and management of suspected primary hyperparathyroidism.  Patient provided with a copy of "Parathyroid Surgery: Treatment for Your Parathyroid Gland Problem", published by [employer], 12 pages. Book reviewed and explained to patient during visit today.  Patient has biochemical evidence of primary hyperparathyroidism. We will proceed with imaging studies to include an ultrasound examination of the neck and a nuclear medicine parathyroid scan. If these are successful and localizing a parathyroid adenoma, then I believe she will be a good candidate for minimally invasive outpatient surgery. If the studies failed to identify an  adenoma, then we will proceed with a 4D CT scan of the neck in hopes of finding the adenoma.  Today we discussed minimally invasive parathyroid surgery. We discussed size and location of the surgical incision. We discussed doing this as an outpatient surgical procedure. We discussed the fact that 95% of the time this is single gland disease. The patient understands and agrees to proceed with imaging studies.  Patient will undergo the above studies. We will contact her with the results when they are available.   Armandina Gemma, MD [facility] [place] practice Office: [phone]

## 2021-03-15 NOTE — Anesthesia Preprocedure Evaluation (Addendum)
Anesthesia Evaluation  Patient identified by MRN, date of birth, ID band Patient awake    Reviewed: Allergy & Precautions, NPO status , Patient's Chart, lab work & pertinent test results  History of Anesthesia Complications Negative for: history of anesthetic complications  Airway Mallampati: II       Dental  (+) Edentulous Upper, Dental Advisory Given   Pulmonary asthma , former smoker,    Pulmonary exam normal        Cardiovascular hypertension, Pt. on medications + CAD  + Valvular Problems/Murmurs AS  Rhythm:Regular Rate:Normal + Systolic murmurs Echo 99/3570 1. The aortic valve is tricuspid. There is mild calcification of the aortic valve. There is mild thickening of the aortic valve. Aortic valve regurgitation is not visualized. Mild aortic valve stenosis. Aortic valve area, by VTI measures 1.66 cm. Aortic valve mean gradient measures 10.5 mmHg. Aortic valve Vmax measures 2.29 m/s.  2. Left ventricular ejection fraction, by estimation, is 60 to 65%. The left ventricle has normal function. The left ventricle has no regional wall motion abnormalities. There is mild concentric left ventricular hypertrophy. Left ventricular diastolic parameters are consistent with Grade I diastolic dysfunction (impaired relaxation).  3. Right ventricular systolic function is normal. The right ventricular size is normal. There is normal pulmonary artery systolic pressure. The estimated right ventricular systolic pressure is 17.7 mmHg.  4. The mitral valve is grossly normal. Trivial mitral valve  regurgitation. No evidence of mitral stenosis.  5. The inferior vena cava is normal in size with greater than 50% respiratory variability, suggesting right atrial pressure of 3 mmHg.    Neuro/Psych PSYCHIATRIC DISORDERS Anxiety Depression    GI/Hepatic negative GI ROS, Neg liver ROS,   Endo/Other  diabetes, Type 2Hypothyroidism Morbid obesity   Renal/GU negative Renal ROS     Musculoskeletal  (+) Arthritis , Fibromyalgia -  Abdominal (+) + obese,   Peds  Hematology  (+) Blood dyscrasia, anemia ,   Anesthesia Other Findings Day of surgery medications reviewed with patient.  Reproductive/Obstetrics negative OB ROS                                                              Anesthesia Evaluation  Patient identified by MRN, date of birth, ID band Patient awake    Reviewed: Allergy & Precautions, NPO status , Patient's Chart, lab work & pertinent test results  Airway Mallampati: I  TM Distance: >3 FB Neck ROM: Full    Dental  (+) Dental Advisory Given, Upper Dentures, Lower Dentures   Pulmonary asthma , former smoker,    breath sounds clear to auscultation       Cardiovascular hypertension, Pt. on medications + CAD   Rhythm:Regular Rate:Normal     Neuro/Psych Anxiety Depression    GI/Hepatic negative GI ROS, Neg liver ROS,   Endo/Other  diabetesHypothyroidism   Renal/GU negative Renal ROS     Musculoskeletal  (+) Arthritis ,   Abdominal (+) + obese,   Peds  Hematology   Anesthesia Other Findings   Reproductive/Obstetrics                           Lab Results  Component Value Date   WBC 5.6 02/19/2021   HGB 13.8 02/19/2021   HCT  41.7 02/19/2021   MCV 94.6 02/19/2021   PLT 194.0 02/19/2021   Lab Results  Component Value Date   CREATININE 0.99 02/19/2021   BUN 11 02/19/2021   NA 137 02/19/2021   K 4.3 02/19/2021   CL 102 02/19/2021   CO2 27 02/19/2021   Lab Results  Component Value Date   INR 1.0 06/16/2019   INR 1.1 11/25/2018     Anesthesia Physical Anesthesia Plan  ASA: III  Anesthesia Plan: Spinal   Post-op Pain Management:  Regional for Post-op pain   Induction: Intravenous  PONV Risk Score and Plan: 3 and Ondansetron, Dexamethasone and Propofol infusion  Airway Management Planned: Natural Airway and  Simple Face Mask  Additional Equipment: None  Intra-op Plan:   Post-operative Plan:   Informed Consent: I have reviewed the patients History and Physical, chart, labs and discussed the procedure including the risks, benefits and alternatives for the proposed anesthesia with the patient or authorized representative who has indicated his/her understanding and acceptance.     Dental advisory given  Plan Discussed with: CRNA  Anesthesia Plan Comments: (See PAT note 11/25/2018, Konrad Felix, PA-C)      Anesthesia Quick Evaluation  Anesthesia Physical  Anesthesia Plan  ASA: 3  Anesthesia Plan: General   Post-op Pain Management:  Regional for Post-op pain and Tylenol PO (pre-op)*   Induction:   PONV Risk Score and Plan: 4 or greater and Treatment may vary due to age or medical condition, Ondansetron, Dexamethasone and Propofol infusion  Airway Management Planned: Oral ETT  Additional Equipment: None  Intra-op Plan:   Post-operative Plan: Extubation in OR  Informed Consent: I have reviewed the patients History and Physical, chart, labs and discussed the procedure including the risks, benefits and alternatives for the proposed anesthesia with the patient or authorized representative who has indicated his/her understanding and acceptance.     Dental advisory given  Plan Discussed with: CRNA  Anesthesia Plan Comments:        Anesthesia Quick Evaluation

## 2021-03-16 ENCOUNTER — Ambulatory Visit (HOSPITAL_BASED_OUTPATIENT_CLINIC_OR_DEPARTMENT_OTHER): Payer: Medicare HMO | Admitting: Anesthesiology

## 2021-03-16 ENCOUNTER — Other Ambulatory Visit: Payer: Self-pay

## 2021-03-16 ENCOUNTER — Encounter (HOSPITAL_COMMUNITY): Payer: Self-pay | Admitting: Surgery

## 2021-03-16 ENCOUNTER — Encounter (HOSPITAL_COMMUNITY)
Admission: RE | Disposition: A | Discharge status: Home / Self Care | Payer: Self-pay | Source: Ambulatory Visit | Attending: Surgery

## 2021-03-16 ENCOUNTER — Ambulatory Visit (HOSPITAL_COMMUNITY)
Admission: RE | Admit: 2021-03-16 | Discharge: 2021-03-16 | Disposition: A | Discharge status: Home / Self Care | Payer: Medicare HMO | Source: Ambulatory Visit | Attending: Surgery | Admitting: Surgery

## 2021-03-16 ENCOUNTER — Ambulatory Visit (HOSPITAL_COMMUNITY): Payer: Medicare HMO | Admitting: Physician Assistant

## 2021-03-16 DIAGNOSIS — E039 Hypothyroidism, unspecified: Secondary | ICD-10-CM

## 2021-03-16 DIAGNOSIS — F418 Other specified anxiety disorders: Secondary | ICD-10-CM

## 2021-03-16 DIAGNOSIS — D351 Benign neoplasm of parathyroid gland: Secondary | ICD-10-CM | POA: Diagnosis not present

## 2021-03-16 DIAGNOSIS — E119 Type 2 diabetes mellitus without complications: Secondary | ICD-10-CM

## 2021-03-16 DIAGNOSIS — J45909 Unspecified asthma, uncomplicated: Secondary | ICD-10-CM | POA: Diagnosis not present

## 2021-03-16 DIAGNOSIS — F419 Anxiety disorder, unspecified: Secondary | ICD-10-CM | POA: Insufficient documentation

## 2021-03-16 DIAGNOSIS — I10 Essential (primary) hypertension: Secondary | ICD-10-CM | POA: Diagnosis not present

## 2021-03-16 DIAGNOSIS — F32A Depression, unspecified: Secondary | ICD-10-CM | POA: Diagnosis not present

## 2021-03-16 DIAGNOSIS — I700 Atherosclerosis of aorta: Secondary | ICD-10-CM | POA: Insufficient documentation

## 2021-03-16 DIAGNOSIS — Z6841 Body Mass Index (BMI) 40.0 and over, adult: Secondary | ICD-10-CM | POA: Diagnosis not present

## 2021-03-16 DIAGNOSIS — E210 Primary hyperparathyroidism: Secondary | ICD-10-CM | POA: Diagnosis present

## 2021-03-16 DIAGNOSIS — M797 Fibromyalgia: Secondary | ICD-10-CM | POA: Insufficient documentation

## 2021-03-16 DIAGNOSIS — Z87891 Personal history of nicotine dependence: Secondary | ICD-10-CM | POA: Diagnosis not present

## 2021-03-16 DIAGNOSIS — I2510 Atherosclerotic heart disease of native coronary artery without angina pectoris: Secondary | ICD-10-CM | POA: Diagnosis not present

## 2021-03-16 HISTORY — PX: PARATHYROIDECTOMY: SHX19

## 2021-03-16 LAB — GLUCOSE, CAPILLARY
Glucose-Capillary: 127 mg/dL — ABNORMAL HIGH (ref 70–99)
Glucose-Capillary: 156 mg/dL — ABNORMAL HIGH (ref 70–99)

## 2021-03-16 SURGERY — PARATHYROIDECTOMY
Anesthesia: General | Laterality: Left

## 2021-03-16 MED ORDER — HEMOSTATIC AGENTS (NO CHARGE) OPTIME
TOPICAL | Status: DC | PRN
Start: 2021-03-16 — End: 2021-03-16
  Administered 2021-03-16: 1 via TOPICAL

## 2021-03-16 MED ORDER — EPHEDRINE 5 MG/ML INJ
INTRAVENOUS | Status: AC
  Filled 2021-03-16: qty 5

## 2021-03-16 MED ORDER — HYDROCODONE-ACETAMINOPHEN 5-325 MG PO TABS: 1.0000 | ORAL_TABLET | Freq: Four times a day (QID) | ORAL | 0 refills | Status: DC | PRN

## 2021-03-16 MED ORDER — OXYCODONE HCL 5 MG PO TABS
ORAL_TABLET | ORAL | Status: AC
  Filled 2021-03-16: qty 1

## 2021-03-16 MED ORDER — LIDOCAINE 2% (20 MG/ML) 5 ML SYRINGE
INTRAMUSCULAR | Status: DC | PRN
  Administered 2021-03-16: 80 mg via INTRAVENOUS

## 2021-03-16 MED ORDER — FENTANYL CITRATE (PF) 100 MCG/2ML IJ SOLN
INTRAMUSCULAR | Status: AC
  Filled 2021-03-16: qty 2

## 2021-03-16 MED ORDER — LACTATED RINGERS IV SOLN: INTRAVENOUS | Status: DC

## 2021-03-16 MED ORDER — CHLORHEXIDINE GLUCONATE CLOTH 2 % EX PADS
6.0000 | MEDICATED_PAD | Freq: Once | CUTANEOUS | Status: DC
Start: 2021-03-16 — End: 2021-03-16

## 2021-03-16 MED ORDER — FENTANYL CITRATE PF 50 MCG/ML IJ SOSY
25.0000 ug | PREFILLED_SYRINGE | INTRAMUSCULAR | Status: DC | PRN
  Administered 2021-03-16: 50 ug via INTRAVENOUS

## 2021-03-16 MED ORDER — PROPOFOL 10 MG/ML IV BOLUS
INTRAVENOUS | Status: AC
  Filled 2021-03-16: qty 20

## 2021-03-16 MED ORDER — OXYCODONE HCL 5 MG PO TABS
5.0000 mg | ORAL_TABLET | Freq: Once | ORAL | Status: AC
  Administered 2021-03-16: 5 mg via ORAL

## 2021-03-16 MED ORDER — PROPOFOL 10 MG/ML IV BOLUS
INTRAVENOUS | Status: DC | PRN
  Administered 2021-03-16: 160 mg via INTRAVENOUS

## 2021-03-16 MED ORDER — FENTANYL CITRATE (PF) 250 MCG/5ML IJ SOLN
INTRAMUSCULAR | Status: DC | PRN
  Administered 2021-03-16 (×4): 50 ug via INTRAVENOUS

## 2021-03-16 MED ORDER — ROCURONIUM BROMIDE 10 MG/ML (PF) SYRINGE
PREFILLED_SYRINGE | INTRAVENOUS | Status: DC | PRN
  Administered 2021-03-16: 50 mg via INTRAVENOUS

## 2021-03-16 MED ORDER — DROPERIDOL 2.5 MG/ML IJ SOLN: 0.6250 mg | Freq: Once | INTRAMUSCULAR | Status: DC | PRN

## 2021-03-16 MED ORDER — HYDRALAZINE HCL 20 MG/ML IJ SOLN
INTRAMUSCULAR | Status: AC
  Filled 2021-03-16: qty 1

## 2021-03-16 MED ORDER — MEPERIDINE HCL 50 MG/ML IJ SOLN: 6.2500 mg | INTRAMUSCULAR | Status: DC | PRN

## 2021-03-16 MED ORDER — ORAL CARE MOUTH RINSE: 15.0000 mL | Freq: Once | OROMUCOSAL | Status: AC

## 2021-03-16 MED ORDER — CEFAZOLIN SODIUM-DEXTROSE 2-4 GM/100ML-% IV SOLN
2.0000 g | INTRAVENOUS | Status: AC
  Administered 2021-03-16: 2 g via INTRAVENOUS
  Filled 2021-03-16: qty 100

## 2021-03-16 MED ORDER — DEXAMETHASONE SODIUM PHOSPHATE 10 MG/ML IJ SOLN
INTRAMUSCULAR | Status: DC | PRN
  Administered 2021-03-16: 10 mg via INTRAVENOUS

## 2021-03-16 MED ORDER — CHLORHEXIDINE GLUCONATE 0.12 % MT SOLN
15.0000 mL | Freq: Once | OROMUCOSAL | Status: AC
  Administered 2021-03-16: 15 mL via OROMUCOSAL

## 2021-03-16 MED ORDER — CHLORHEXIDINE GLUCONATE CLOTH 2 % EX PADS: 6.0000 | MEDICATED_PAD | Freq: Once | CUTANEOUS | Status: DC

## 2021-03-16 MED ORDER — BUPIVACAINE HCL (PF) 0.5 % IJ SOLN
INTRAMUSCULAR | Status: AC
  Filled 2021-03-16: qty 30

## 2021-03-16 MED ORDER — SUGAMMADEX SODIUM 200 MG/2ML IV SOLN
INTRAVENOUS | Status: DC | PRN
  Administered 2021-03-16: 200 mg via INTRAVENOUS

## 2021-03-16 MED ORDER — FENTANYL CITRATE PF 50 MCG/ML IJ SOSY
PREFILLED_SYRINGE | INTRAMUSCULAR | Status: AC
  Filled 2021-03-16: qty 2

## 2021-03-16 MED ORDER — BUPIVACAINE HCL (PF) 0.5 % IJ SOLN
INTRAMUSCULAR | Status: DC | PRN
Start: 2021-03-16 — End: 2021-03-16
  Administered 2021-03-16: 10 mL

## 2021-03-16 MED ORDER — EPHEDRINE SULFATE-NACL 50-0.9 MG/10ML-% IV SOSY
PREFILLED_SYRINGE | INTRAVENOUS | Status: DC | PRN
  Administered 2021-03-16: 5 mg via INTRAVENOUS

## 2021-03-16 MED ORDER — 0.9 % SODIUM CHLORIDE (POUR BTL) OPTIME
TOPICAL | Status: DC | PRN
  Administered 2021-03-16: 1000 mL

## 2021-03-16 MED ORDER — ONDANSETRON HCL 4 MG/2ML IJ SOLN
INTRAMUSCULAR | Status: DC | PRN
Start: 2021-03-16 — End: 2021-03-16
  Administered 2021-03-16: 4 mg via INTRAVENOUS

## 2021-03-16 SURGICAL SUPPLY — 34 items
ATTRACTOMAT 16X20 MAGNETIC DRP (DRAPES) ×2 IMPLANT
BAG COUNTER SPONGE SURGICOUNT (BAG) ×2 IMPLANT
BLADE SURG 15 STRL LF DISP TIS (BLADE) ×1 IMPLANT
BLADE SURG 15 STRL SS (BLADE) ×2
CHLORAPREP W/TINT 26 (MISCELLANEOUS) ×2 IMPLANT
CLIP TI MEDIUM 6 (CLIP) ×4 IMPLANT
CLIP TI WIDE RED SMALL 6 (CLIP) ×4 IMPLANT
COVER SURGICAL LIGHT HANDLE (MISCELLANEOUS) ×2 IMPLANT
DERMABOND ADVANCED (GAUZE/BANDAGES/DRESSINGS) ×1
DERMABOND ADVANCED .7 DNX12 (GAUZE/BANDAGES/DRESSINGS) ×1 IMPLANT
DRAPE LAPAROTOMY T 98X78 PEDS (DRAPES) ×2 IMPLANT
DRAPE UTILITY XL STRL (DRAPES) ×2 IMPLANT
ELECT PENCIL ROCKER SW 15FT (MISCELLANEOUS) ×1 IMPLANT
ELECT REM PT RETURN 15FT ADLT (MISCELLANEOUS) ×2 IMPLANT
GAUZE 4X4 16PLY ~~LOC~~+RFID DBL (SPONGE) ×2 IMPLANT
GLOVE SURG SYN 7.5  E (GLOVE) ×4
GLOVE SURG SYN 7.5 E (GLOVE) ×2 IMPLANT
GLOVE SURG SYN 7.5 PF PI (GLOVE) ×2 IMPLANT
GOWN STRL REUS W/TWL XL LVL3 (GOWN DISPOSABLE) ×6 IMPLANT
HEMOSTAT SURGICEL 2X4 FIBR (HEMOSTASIS) ×2 IMPLANT
ILLUMINATOR WAVEGUIDE N/F (MISCELLANEOUS) IMPLANT
KIT BASIN OR (CUSTOM PROCEDURE TRAY) ×2 IMPLANT
KIT TURNOVER KIT A (KITS) IMPLANT
NDL HYPO 25X1 1.5 SAFETY (NEEDLE) ×1 IMPLANT
NEEDLE HYPO 25X1 1.5 SAFETY (NEEDLE) ×2 IMPLANT
PACK BASIC VI WITH GOWN DISP (CUSTOM PROCEDURE TRAY) ×2 IMPLANT
PENCIL SMOKE EVACUATOR (MISCELLANEOUS) ×1 IMPLANT
SUT MNCRL AB 4-0 PS2 18 (SUTURE) ×2 IMPLANT
SUT VIC AB 3-0 SH 18 (SUTURE) ×3 IMPLANT
SYR BULB IRRIG 60ML STRL (SYRINGE) ×2 IMPLANT
SYR CONTROL 10ML LL (SYRINGE) ×2 IMPLANT
TOWEL OR 17X26 10 PK STRL BLUE (TOWEL DISPOSABLE) ×2 IMPLANT
TOWEL OR NON WOVEN STRL DISP B (DISPOSABLE) ×2 IMPLANT
TUBING CONNECTING 10 (TUBING) ×2 IMPLANT

## 2021-03-16 NOTE — Discharge Instructions (Signed)
[facility] - Dr. Jhade Berko  THYROID & PARATHYROID SURGERY:  POST-OP INSTRUCTIONS  Always review the instruction sheet provided by the hospital nurse at discharge.  A prescription for pain medication may be sent to your pharmacy at the time of discharge.  Take your pain medication as prescribed.  If narcotic pain medicine is not needed, then you may take acetaminophen (Tylenol) or ibuprofen (Advil) as needed for pain or soreness.  Take your normal home medications as prescribed unless otherwise directed.  If you need a refill on your pain medication, please contact the office during regular business hours.  Prescriptions will not be processed by the office after 5:00PM or on weekends.  Start with a light diet upon arrival home, such as soup and crackers or toast.  Be sure to drink plenty of fluids.  Resume your normal diet the day after surgery.  Most patients will experience some swelling and bruising on the chest and neck area.  Ice packs will help for the first 48 hours after arriving home.  Swelling and bruising will take several days to resolve.   It is common to experience some constipation after surgery.  Increasing fluid intake and taking a stool softener (Colace) will usually help to prevent this problem.  A mild laxative (Milk of Magnesia or Miralax) should be taken according to package directions if there has been no bowel movement after 48 hours.  Dermabond glue covers your incision. This seals the wound and you may shower at any time. The Dermabond will remain in place for about a week.  You may gradually remove the glue when it loosens around the edges.  If you need to loosen the Dermabond for removal, apply a layer of Vaseline to the wound for 15 minutes and then remove with a Kleenex. Your sutures are under the skin and will not show - they will dissolve on their own.  You may resume light daily activities beginning the day after discharge (such as self-care,  walking, climbing stairs), gradually increasing activities as tolerated. You may have sexual intercourse when it is comfortable. Refrain from any heavy lifting or straining until approved by your doctor. You may drive when you no longer are taking prescription pain medication, you can comfortably wear a seatbelt, and you can safely maneuver your car and apply the brakes.  You will see your doctor in the office for a follow-up appointment approximately three weeks after your surgery.  Make sure that you call for this appointment within a day or two after you arrive home to insure a convenient appointment time. Please have any requested laboratory tests performed a few days prior to your office visit so that the results will be available at your follow up appointment.  WHEN TO CALL THE [facility] OFFICE: -- Fever greater than 101.5 -- Inability to urinate -- Nausea and/or vomiting - persistent -- Extreme swelling or bruising -- Continued bleeding from incision -- Increased pain, redness, or drainage from the incision -- Difficulty swallowing or breathing -- Muscle cramping or spasms -- Numbness or tingling in hands or around lips  The clinic staff is available to answer your questions during regular business hours.  Please don't hesitate to call and ask to speak to one of the nurses if you have concerns.  [facility] OFFICE: [phone] (24 hours)  Please sign up for MyChart accounts. This will allow you to communicate directly with my nurse or myself without having to call the office. It will also allow you   to view your test results. You will need to enroll in MyChart for my office ([facility]) and for the hospital ().  Delorice Bannister, MD [facility] practice 

## 2021-03-16 NOTE — Op Note (Signed)
OPERATIVE REPORT - PARATHYROIDECTOMY  Preoperative diagnosis: Primary hyperparathyroidism  Postop diagnosis: Same  Procedure: Left minimally invasive parathyroidectomy  Surgeon:  Armandina Gemma, MD  Anesthesia: General endotracheal  Estimated blood loss: Minimal  Preparation: ChloraPrep  Indications: Patient is referred by Dr. Daine Floras from endocrinology for surgical evaluation and recommendations regarding primary hyperparathyroidism. Patient's primary care physician is Dr. Garret Reddish. 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 Patient had been followed for several years by Dr. Legrand Como Altheimer for hypercalcemia. Levels had always been borderline. Patient has now developed osteoporosis. She does have chronic fatigue. She does have diminished renal function. She is therefore sent for evaluation for primary hyperparathyroidism. Her most recent calcium level was 10.5. Her most recent intact PTH level was 107. Her 25-hydroxy vitamin D level was normal at 43. Sestamibi scan localizes a left inferior parathyroid adenoma.  Patient now comes to surgery for parathyroidectomy.  Procedure: The patient was prepared in the pre-operative holding area. The patient was brought to the operating room and placed in a supine position on the operating room table. Following administration of general anesthesia, the patient was positioned and then prepped and draped in the usual strict aseptic fashion. After ascertaining that an adequate level of anesthesia been achieved, a neck incision was made with a #15 blade. Dissection was carried through subcutaneous tissues and platysma. Hemostasis was obtained with the electrocautery. Skin flaps were developed circumferentially and a Weitlander retractor was placed for exposure.  Strap muscles were incised in the midline. Strap muscles were reflected laterally exposing the  thyroid lobe. With gentle blunt dissection the thyroid lobe was mobilized.  Dissection was carried posteriorly and an enlarged parathyroid gland was identified. It was gently mobilized. Vascular structures were divided between small ligaclips. Care was taken to avoid the recurrent laryngeal nerve. The parathyroid gland was completely excised. It was submitted to pathology where frozen section confirmed hypercellular parathyroid tissue consistent with adenoma.  Neck was irrigated with warm saline and good hemostasis was noted. Fibrillar was placed in the operative field. Strap muscles were approximated in the midline with interrupted 3-0 Vicryl sutures. Platysma was closed with interrupted 3-0 Vicryl sutures. Marcaine was infiltrated circumferentially. Skin was closed with a running 4-0 Monocryl subcuticular suture. Wound was washed and dried and Dermabond was applied. Patient was awakened from anesthesia and brought to the recovery room. The patient tolerated the procedure well.   Armandina Gemma, MD [facility] Office: [phone]

## 2021-03-16 NOTE — Interval H&P Note (Signed)
History and Physical Interval Note:  03/16/2021 7:04 AM  [name]  has presented today for surgery, with the diagnosis of PRIMARY HYPERPARATHYROIDISM.  The various methods of treatment have been discussed with the patient and family. After consideration of risks, benefits and other options for treatment, the patient has consented to    Procedure(s): LEFT INFERIOR PARATHYROIDECTOMY (Left) as a surgical intervention.    The patient's history has been reviewed, patient examined, no change in status, stable for surgery.  I have reviewed the patient's chart and labs.  Questions were answered to the patient's satisfaction.    Armandina Gemma, [facility] [place] practice Office: [place]

## 2021-03-16 NOTE — Anesthesia Procedure Notes (Signed)
Procedure Name: Intubation Date/Time: 03/16/2021 7:42 AM Performed by: Roxie Gueye D, CRNA Pre-anesthesia Checklist: Patient identified, Emergency Drugs available, Suction available and Patient being monitored Patient Re-evaluated:Patient Re-evaluated prior to induction Oxygen Delivery Method: Circle system utilized Preoxygenation: Pre-oxygenation with 100% oxygen Induction Type: IV induction Ventilation: Mask ventilation without difficulty Laryngoscope Size: Mac and 3 Grade View: Grade I Tube type: Oral Tube size: 7.0 mm Number of attempts: 1 Airway Equipment and Method: Stylet Placement Confirmation: ETT inserted through vocal cords under direct vision, positive ETCO2 and breath sounds checked- equal and bilateral Secured at: 21 cm Tube secured with: Tape Dental Injury: Teeth and Oropharynx as per pre-operative assessment

## 2021-03-16 NOTE — Anesthesia Postprocedure Evaluation (Signed)
Anesthesia Post Note  Patient: [name]  Procedure(s) Performed: LEFT INFERIOR PARATHYROIDECTOMY (Left)     Patient location during evaluation: PACU Anesthesia Type: General Level of consciousness: sedated and patient cooperative Pain management: pain level controlled Vital Signs Assessment: post-procedure vital signs reviewed and stable Respiratory status: spontaneous breathing Cardiovascular status: stable Anesthetic complications: no   No notable events documented.  Last Vitals:  Vitals:   03/16/21 1000 03/16/21 1003  BP: (!) 171/82 (!) 171/82  Pulse: 66 65  Resp:  14  Temp:    SpO2: (!) 88% 93%    Last Pain:  Vitals:   03/16/21 0945  TempSrc:   PainSc: 2                  Nolon Nations

## 2021-03-16 NOTE — Transfer of Care (Signed)
Immediate Anesthesia Transfer of Care Note  Patient: [name]  Procedure(s) Performed: LEFT INFERIOR PARATHYROIDECTOMY (Left)  Patient Location: PACU  Anesthesia Type:General  Level of Consciousness: awake, alert  and oriented  Airway & Oxygen Therapy: Patient Spontanous Breathing and Patient connected to face mask oxygen  Post-op Assessment: Report given to RN and Post -op Vital signs reviewed and stable  Post vital signs: Reviewed and stable  Last Vitals:  Vitals Value Taken Time  BP 179/92 03/16/21 0907  Temp    Pulse 60 03/16/21 0908  Resp 14 03/16/21 0908  SpO2 94 % 03/16/21 0908  Vitals shown include unvalidated device data.  Last Pain:  Vitals:   03/16/21 0614  TempSrc:   PainSc: 0-No pain         Complications: No notable events documented.

## 2021-03-17 ENCOUNTER — Encounter (HOSPITAL_COMMUNITY): Payer: Self-pay | Admitting: Surgery

## 2021-03-19 ENCOUNTER — Other Ambulatory Visit: Payer: Self-pay | Admitting: Family Medicine

## 2021-03-19 LAB — SURGICAL PATHOLOGY

## 2021-03-20 NOTE — Progress Notes (Signed)
Path shows parathyroid adenoma, as expected.  Will await post op labs in a few weeks.  [place], MD [facility] [place] practice Office: [phone]

## 2021-03-26 DIAGNOSIS — E210 Primary hyperparathyroidism: Secondary | ICD-10-CM | POA: Diagnosis not present

## 2021-03-26 DIAGNOSIS — E892 Postprocedural hypoparathyroidism: Secondary | ICD-10-CM | POA: Diagnosis not present

## 2021-04-06 NOTE — Progress Notes (Deleted)
? ?Chronic Care Management ?Pharmacy Note ? ?04/06/2021 ?Name:  [name] MRN:  [id number] DOB:  [birth year] ? ?Summary: ?PharmD initial visit.  All meds reviewed.  Chronic conditions well controlled. ? ?Recommendations/Changes made from today's visit: ?Due for diabetic foot exam ? ?Plan: ?FU 6 months ? ? ?Subjective: ?[name] is an 80 y.o. year old female who is a primary patient of Hunter, Brayton Mars, MD.  The CCM team was consulted for assistance with disease management and care coordination needs.   ? ?Engaged with patient by telephone for initial visit in response to provider referral for pharmacy case management and/or care coordination services.  ? ?Consent to Services:  ?The patient was given the following information about Chronic Care Management services today, agreed to services, and gave verbal consent: 1. CCM service includes personalized support from designated clinical staff supervised by the primary care provider, including individualized plan of care and coordination with other care providers 2. 24/7 contact phone numbers for assistance for urgent and routine care needs. 3. Service will only be billed when office clinical staff spend 20 minutes or more in a month to coordinate care. 4. Only one practitioner may furnish and bill the service in a calendar month. 5.The patient may stop CCM services at any time (effective at the end of the month) by phone call to the office staff. 6. The patient will be responsible for cost sharing (co-pay) of up to 20% of the service fee (after annual deductible is met). Patient agreed to services and consent obtained. ? ?Patient Care Team: ?Marin Olp, MD as PCP - General (Family Medicine) ?Belva Crome, MD as PCP - Cardiology (Cardiology) ?Clarene Essex, MD as Consulting Physician (Gastroenterology) ?Altheimer, Legrand Como, MD as Referring Physician (Endocrinology) ?Luberta Mutter, MD as Consulting Physician (Ophthalmology) ?Gaynelle Arabian, MD  as Consulting Physician (Orthopedic Surgery) ?Johnathan Hausen, MD as Consulting Physician (General Surgery) ?Raynelle Bring, MD as Consulting Physician (Urology) ?Rozetta Nunnery, MD (Inactive) as Consulting Physician (Otolaryngology) ?Gaynelle Arabian, MD as Consulting Physician (Orthopedic Surgery) ?Edythe Clarity, [facility] as Pharmacist (Pharmacist) ? ?Recent office visits:  ?03/31/20 Video Visit, Allwardt, Alyssa PA-C, Acute Visit. No medication changes, No follow ups on file. ?07/03/20 Office Visit, Billey Chang MD, Acute Visit Started Amoxicillin 875 mg 2 times daily for 5 days for Acute Cystitis. Follow up as needed. ?07/11/20 Office Visit, Garret Reddish MD, Started RX Hydrocodone-Acetaminophen 5-325 mg take 1-2 tablets by mouth every 6 hours as needed. Follow up in 4 months. ?07/11/20 Patient Message, Garret Reddish MD, refilled Desvenlafaxine Suc ER 100 mg. No follow ups on file  ?  ?Recent consult visits:  ?None in the last 6 months ?  ?Hospital visits:  ?09/09/20 [facility], Blanchie Serve MD Acute Cystitis, Started Cephalexin 500 mg 2 times daily for 5 days, Started Phenazopyridine HCI 200 mg 3 times daily for 2 days. Discontinued Hydrocodone 5-325 mg 1-2 tablets every 6 hours as needed and Prednisone 37m. No follows up on file . ?09/15/20 [facility] seen for UTI Started Ciprofloxacin HCI 500 mg every 12 hours. NO follow ups on file  ?  ? ? ?Objective: ? ?Lab Results  ?Component Value Date  ? CREATININE 0.99 02/19/2021  ? BUN 11 02/19/2021  ? GFR 53.69 (L) 02/19/2021  ? GFRNONAA 64 10/19/2019  ? GFRAA 74 10/19/2019  ? NA 137 02/19/2021  ? K 4.3 02/19/2021  ? CALCIUM 9.8 02/19/2021  ? CO2 27 02/19/2021  ? GLUCOSE 150 (H)  02/19/2021  ? ? ?Lab Results  ?Component Value Date/Time  ? HGBA1C 6.7 (H) 02/19/2021 11:15 AM  ? HGBA1C 6.9 (H) 11/16/2020 10:42 AM  ? GFR 53.69 (L) 02/19/2021 11:15 AM  ? GFR 55.13 (L) 11/16/2020 10:42 AM  ? MICROALBUR 2.1 (H) 07/11/2020 10:35 AM  ? MICROALBUR  <0.7 05/31/2019 01:55 PM  ?  ?Last diabetic Eye exam:  ?Lab Results  ?Component Value Date/Time  ? HMDIABEYEEXA No Retinopathy 12/27/2020 12:00 AM  ?  ?Last diabetic Foot exam:  ?Lab Results  ?Component Value Date/Time  ? HMDIABFOOTEX done 09/19/2009 12:00 AM  ?  ? ?Lab Results  ?Component Value Date  ? CHOL 130 11/16/2020  ? HDL 60.10 11/16/2020  ? LDLCALC 45 11/16/2020  ? LDLDIRECT 60.0 05/31/2019  ? TRIG 128.0 11/16/2020  ? CHOLHDL 2 11/16/2020  ? ? ?Hepatic Function Latest Ref Rng & Units 02/19/2021 11/16/2020 07/11/2020  ?Total Protein 6.0 - 8.3 g/dL 6.2 6.6 7.1  ?Albumin 3.5 - 5.2 g/dL 4.0 4.0 4.3  ?AST 0 - 37 U/L 18 21 19   ?ALT 0 - 35 U/L 14 16 24   ?Alk Phosphatase 39 - 117 U/L 110 110 112  ?Total Bilirubin 0.2 - 1.2 mg/dL 0.5 0.8 0.6  ?Bilirubin, Direct 0.0 - 0.3 mg/dL - - -  ? ? ?Lab Results  ?Component Value Date/Time  ? TSH 0.33 (L) 02/19/2021 11:15 AM  ? TSH 3.02 01/01/2019 12:00 AM  ? TSH 0.10 (L) 07/30/2011 10:46 AM  ? FREET4 1.42 02/19/2021 11:15 AM  ? FREET4 0.81 02/25/2011 10:03 AM  ? ? ?CBC Latest Ref Rng & Units 02/19/2021 11/16/2020 05/30/2020  ?WBC 4.0 - 10.5 K/uL 5.6 6.1 10.9  ?Hemoglobin 12.0 - 15.0 g/dL 13.8 13.7 14.1  ?Hematocrit 36.0 - 46.0 % 41.7 41.7 42  ?Platelets 150.0 - 400.0 K/uL 194.0 211.0 227  ? ? ?Lab Results  ?Component Value Date/Time  ? VD25OH 44.68 07/11/2020 10:35 AM  ? VD25OH 43.94 12/30/2014 09:57 AM  ? ? ?Clinical ASCVD: No  ?The ASCVD Risk score (Arnett DK, et al., 2019) failed to calculate for the following reasons: ?  The 2019 ASCVD risk score is only valid for ages 67 to 106   ? ?Depression screen [facility] 2/9 02/16/2021 02/12/2021 11/16/2020  ?Decreased Interest 0 0 0  ?Down, Depressed, Hopeless 0 0 0  ?PHQ - 2 Score 0 0 0  ?Altered sleeping 0 - 2  ?Tired, decreased energy 1 - 3  ?Change in appetite 0 - 3  ?Feeling bad or failure about yourself  0 - 0  ?Trouble concentrating 1 - 0  ?Moving slowly or fidgety/restless 0 - 0  ?Suicidal thoughts 0 - 0  ?PHQ-9 Score 2 - 8  ?Difficult  doing work/chores Not difficult at all - Somewhat difficult  ?Some recent data might be hidden  ?  ? ? ?Social History  ? ?Tobacco Use  ?Smoking Status Former  ? Packs/day: 0.70  ? Years: 20.00  ? Pack years: 14.00  ? Types: Cigarettes  ? Quit date: 03/04/1978  ? Years since quitting: 43.1  ?Smokeless Tobacco Never  ? ?BP Readings from Last 3 Encounters:  ?03/16/21 (!) 171/82  ?03/01/21 (!) 169/84  ?02/16/21 138/86  ? ?Pulse Readings from Last 3 Encounters:  ?03/16/21 65  ?03/01/21 67  ?02/16/21 66  ? ?Wt Readings from Last 3 Encounters:  ?03/16/21 220 lb 3.2 oz (99.9 kg)  ?03/01/21 220 lb 3.2 oz (99.9 kg)  ?02/16/21 226 lb 6.4 oz (102.7 kg)  ? ?  BMI Readings from Last 3 Encounters:  ?03/16/21 41.61 kg/m?  ?03/01/21 41.61 kg/m?  ?02/16/21 42.78 kg/m?  ? ? ?Assessment/Interventions: Review of patient past medical history, allergies, medications, health status, including review of consultants reports, laboratory and other test data, was performed as part of comprehensive evaluation and provision of chronic care management services.  ? ?SDOH:  (Social Determinants of Health) assessments and interventions performed: Yes ? ?Financial Resource Strain: Low Risk   ? Difficulty of Paying Living Expenses: Not hard at all  ? ? ?SDOH Screenings  ? ?Alcohol Screen: Not on file  ?Depression (PHQ2-9): Low Risk   ? PHQ-2 Score: 2  ?Financial Resource Strain: Low Risk   ? Difficulty of Paying Living Expenses: Not hard at all  ?Food Insecurity: No Food Insecurity  ? Worried About Charity fundraiser in the Last Year: Never true  ? Ran Out of Food in the Last Year: Never true  ?Housing: Low Risk   ? Last Housing Risk Score: 0  ?Physical Activity: Inactive  ? Days of Exercise per Week: 0 days  ? Minutes of Exercise per Session: 0 min  ?Social Connections: Moderately Isolated  ? Frequency of Communication with Friends and Family: Three times a week  ? Frequency of Social Gatherings with Friends and Family: Three times a week  ? Attends  Religious Services: More than 4 times per year  ? Active Member of Clubs or Organizations: No  ? Attends Archivist Meetings: Never  ? Marital Status: Widowed  ?Stress: No Stress Concern [place]

## 2021-04-09 DIAGNOSIS — E038 Other specified hypothyroidism: Secondary | ICD-10-CM | POA: Diagnosis not present

## 2021-04-09 DIAGNOSIS — E063 Autoimmune thyroiditis: Secondary | ICD-10-CM | POA: Diagnosis not present

## 2021-04-16 ENCOUNTER — Telehealth: Payer: Medicare HMO

## 2021-04-17 DIAGNOSIS — E038 Other specified hypothyroidism: Secondary | ICD-10-CM | POA: Diagnosis not present

## 2021-04-17 DIAGNOSIS — E063 Autoimmune thyroiditis: Secondary | ICD-10-CM | POA: Diagnosis not present

## 2021-04-17 DIAGNOSIS — Z79890 Hormone replacement therapy (postmenopausal): Secondary | ICD-10-CM | POA: Diagnosis not present

## 2021-04-17 DIAGNOSIS — M818 Other osteoporosis without current pathological fracture: Secondary | ICD-10-CM | POA: Diagnosis not present

## 2021-04-19 DIAGNOSIS — Z7952 Long term (current) use of systemic steroids: Secondary | ICD-10-CM | POA: Diagnosis not present

## 2021-04-19 DIAGNOSIS — M1991 Primary osteoarthritis, unspecified site: Secondary | ICD-10-CM | POA: Diagnosis not present

## 2021-04-19 DIAGNOSIS — M353 Polymyalgia rheumatica: Secondary | ICD-10-CM | POA: Diagnosis not present

## 2021-04-19 DIAGNOSIS — Z6841 Body Mass Index (BMI) 40.0 and over, adult: Secondary | ICD-10-CM | POA: Diagnosis not present

## 2021-04-19 DIAGNOSIS — M5136 Other intervertebral disc degeneration, lumbar region: Secondary | ICD-10-CM | POA: Diagnosis not present

## 2021-04-28 ENCOUNTER — Encounter: Payer: Self-pay | Admitting: Family Medicine

## 2021-04-30 ENCOUNTER — Other Ambulatory Visit: Payer: Self-pay

## 2021-04-30 ENCOUNTER — Other Ambulatory Visit (HOSPITAL_COMMUNITY): Payer: Self-pay

## 2021-04-30 MED ORDER — DICLOFENAC SODIUM 75 MG PO TBEC
75.0000 mg | DELAYED_RELEASE_TABLET | Freq: Two times a day (BID) | ORAL | 3 refills | Status: DC
  Filled 2021-04-30: qty 180, 90d supply, fill #0
  Filled 2021-07-12 – 2021-07-14 (×2): qty 180, 90d supply, fill #1
  Filled 2021-10-05: qty 180, 90d supply, fill #2
  Filled 2022-01-16: qty 180, 90d supply, fill #3

## 2021-05-14 DIAGNOSIS — E038 Other specified hypothyroidism: Secondary | ICD-10-CM | POA: Diagnosis not present

## 2021-05-14 DIAGNOSIS — E559 Vitamin D deficiency, unspecified: Secondary | ICD-10-CM | POA: Diagnosis not present

## 2021-05-14 DIAGNOSIS — E210 Primary hyperparathyroidism: Secondary | ICD-10-CM | POA: Diagnosis not present

## 2021-05-14 DIAGNOSIS — M818 Other osteoporosis without current pathological fracture: Secondary | ICD-10-CM | POA: Diagnosis not present

## 2021-05-14 DIAGNOSIS — E063 Autoimmune thyroiditis: Secondary | ICD-10-CM | POA: Diagnosis not present

## 2021-06-11 ENCOUNTER — Ambulatory Visit (INDEPENDENT_AMBULATORY_CARE_PROVIDER_SITE_OTHER): Payer: Medicare HMO | Admitting: Family Medicine

## 2021-06-11 ENCOUNTER — Encounter: Payer: Self-pay | Admitting: Family Medicine

## 2021-06-11 VITALS — BP 148/80 | HR 63 | Temp 98.0°F | Ht 61.0 in | Wt 227.4 lb

## 2021-06-11 DIAGNOSIS — I2510 Atherosclerotic heart disease of native coronary artery without angina pectoris: Secondary | ICD-10-CM

## 2021-06-11 DIAGNOSIS — E119 Type 2 diabetes mellitus without complications: Secondary | ICD-10-CM

## 2021-06-11 DIAGNOSIS — E785 Hyperlipidemia, unspecified: Secondary | ICD-10-CM

## 2021-06-11 DIAGNOSIS — M791 Myalgia, unspecified site: Secondary | ICD-10-CM | POA: Diagnosis not present

## 2021-06-11 DIAGNOSIS — I700 Atherosclerosis of aorta: Secondary | ICD-10-CM | POA: Diagnosis not present

## 2021-06-11 DIAGNOSIS — E039 Hypothyroidism, unspecified: Secondary | ICD-10-CM

## 2021-06-11 DIAGNOSIS — I10 Essential (primary) hypertension: Secondary | ICD-10-CM | POA: Diagnosis not present

## 2021-06-11 DIAGNOSIS — E538 Deficiency of other specified B group vitamins: Secondary | ICD-10-CM

## 2021-06-11 DIAGNOSIS — E210 Primary hyperparathyroidism: Secondary | ICD-10-CM

## 2021-06-11 DIAGNOSIS — I350 Nonrheumatic aortic (valve) stenosis: Secondary | ICD-10-CM | POA: Diagnosis not present

## 2021-06-11 DIAGNOSIS — E1169 Type 2 diabetes mellitus with other specified complication: Secondary | ICD-10-CM | POA: Diagnosis not present

## 2021-06-11 LAB — CBC WITH DIFFERENTIAL/PLATELET
Basophils Absolute: 0.1 10*3/uL (ref 0.0–0.1)
Basophils Relative: 1.2 % (ref 0.0–3.0)
Eosinophils Absolute: 0.4 10*3/uL (ref 0.0–0.7)
Eosinophils Relative: 8.4 % — ABNORMAL HIGH (ref 0.0–5.0)
HCT: 39 % (ref 36.0–46.0)
Hemoglobin: 13.1 g/dL (ref 12.0–15.0)
Lymphocytes Relative: 23.8 % (ref 12.0–46.0)
Lymphs Abs: 1.2 10*3/uL (ref 0.7–4.0)
MCHC: 33.6 g/dL (ref 30.0–36.0)
MCV: 94.2 fl (ref 78.0–100.0)
Monocytes Absolute: 0.5 10*3/uL (ref 0.1–1.0)
Monocytes Relative: 11.1 % (ref 3.0–12.0)
Neutro Abs: 2.7 10*3/uL (ref 1.4–7.7)
Neutrophils Relative %: 55.5 % (ref 43.0–77.0)
Platelets: 206 10*3/uL (ref 150.0–400.0)
RBC: 4.14 Mil/uL (ref 3.87–5.11)
RDW: 14.9 % (ref 11.5–15.5)
WBC: 4.9 10*3/uL (ref 4.0–10.5)

## 2021-06-11 LAB — HEMOGLOBIN A1C: Hgb A1c MFr Bld: 6.4 % (ref 4.6–6.5)

## 2021-06-11 LAB — COMPREHENSIVE METABOLIC PANEL
ALT: 19 U/L (ref 0–35)
AST: 23 U/L (ref 0–37)
Albumin: 4.1 g/dL (ref 3.5–5.2)
Alkaline Phosphatase: 110 U/L (ref 39–117)
BUN: 10 mg/dL (ref 6–23)
CO2: 31 mEq/L (ref 19–32)
Calcium: 9 mg/dL (ref 8.4–10.5)
Chloride: 100 mEq/L (ref 96–112)
Creatinine, Ser: 0.92 mg/dL (ref 0.40–1.20)
GFR: 58.51 mL/min — ABNORMAL LOW (ref >60.00)
Glucose, Bld: 116 mg/dL — ABNORMAL HIGH (ref 70–99)
Potassium: 4.3 mEq/L (ref 3.5–5.1)
Sodium: 137 mEq/L (ref 135–145)
Total Bilirubin: 0.6 mg/dL (ref 0.2–1.2)
Total Protein: 6.6 g/dL (ref 6.0–8.3)

## 2021-06-11 LAB — MICROALBUMIN / CREATININE URINE RATIO
Creatinine,U: 33.1 mg/dL
Microalb Creat Ratio: 2.1 mg/g (ref 0.0–30.0)
Microalb, Ur: <0.7 mg/dL (ref 0.0–1.9)

## 2021-06-11 MED ORDER — METOPROLOL SUCCINATE ER 25 MG PO TB24: 25.0000 mg | ORAL_TABLET | Freq: Every day | ORAL | 5 refills | Status: DC

## 2021-06-11 MED ORDER — HYDROCODONE-ACETAMINOPHEN 5-325 MG PO TABS
1.0000 | ORAL_TABLET | Freq: Four times a day (QID) | ORAL | 0 refills | Status: DC | PRN
Start: 2021-06-11 — End: 2022-01-08

## 2021-06-11 NOTE — Progress Notes (Signed)
Phone [phone] In person visit   Subjective:   [name] is a 81 y.o. year old very pleasant female patient who presents for/with See problem oriented charting Chief Complaint  Patient presents with   Follow-up   Diabetes   Hypertension    Past Medical History-  Patient Active Problem List   Diagnosis Date Noted   Osteoporosis 10/29/2020    Priority: High   History of polymyalgia rheumatica 03/16/2018    Priority: High   CAD (coronary artery disease) 11/19/2017    Priority: High   Hypercalcemia 12/29/2014    Priority: High   OA (osteoarthritis) of knee 10/05/2013    Priority: High   Well controlled type 2 diabetes mellitus ([place]) 07/18/2006    Priority: High   Mild aortic stenosis 11/16/2020    Priority: Medium    B12 deficiency 05/31/2019    Priority: Medium    Overactive bladder 09/03/2016    Priority: Medium    Morbid obesity ([place]) 06/28/2015    Priority: Medium    History of gastric bypass 02/08/2014    Priority: Medium    Hyperlipidemia associated with type 2 diabetes mellitus ([place]) 01/05/2008    Priority: Medium    Hypothyroidism 09/09/2007    Priority: Medium    Major depression, recurrent, full remission ([place]) 07/18/2006    Priority: Medium    Essential hypertension 07/18/2006    Priority: Medium    Asthma 07/18/2006    Priority: Medium    Primary osteoarthritis of right knee 06/28/2019    Priority: Low   Allergic rhinitis 07/20/2014    Priority: Low   CHEST WALL PAIN, ANTERIOR 10/09/2007    Priority: Low   Hyperparathyroidism, primary ([place]) 03/14/2021   Aortic atherosclerosis ([place]) 02/16/2021    Medications- reviewed and updated Current Outpatient Medications  Medication Sig Dispense Refill   metoprolol succinate (TOPROL-XL) 25 MG 24 hr tablet Take 1 tablet (25 mg total) by mouth daily. 30 tablet 5   acetaminophen (TYLENOL) 500 MG tablet Take 1,000 mg by mouth at bedtime as needed for mild pain. Can take up to three times day      albuterol (VENTOLIN HFA) 108 (90 Base) MCG/ACT inhaler Inhale 1-2 puffs into the lungs every 4 (four) hours as needed for wheezing or shortness of breath. 18 g 2   amLODipine (NORVASC) 10 MG tablet TAKE 1 TABLET EVERY DAY (Patient taking differently: Take 10 mg by mouth at bedtime.) 90 tablet 2   aspirin EC 81 MG tablet Take 81 mg by mouth daily. Swallow whole.     Biotin 5000 MCG CAPS Take 5,000 mcg by mouth daily at 2 am.     Cholecalciferol (VITAMIN D-3) 125 MCG (5000 UT) TABS Take 5,000 Units by mouth daily.     cyanocobalamin (,VITAMIN B-12,) 1000 MCG/ML injection Inject 1 mL (1,000 mcg total) into the muscle every 30 (thirty) days. Uses on amonthly basis (Patient taking differently: Inject 1,000 mcg into the muscle every 30 (thirty) days.) 10 mL 0   desvenlafaxine (PRISTIQ) 100 MG 24 hr tablet TAKE 1 TABLET EVERY DAY 90 tablet 2   diclofenac (VOLTAREN) 75 MG EC tablet Take 1 tablet (75 mg total) by mouth 2 (two) times daily. 180 tablet 3   Evolocumab (REPATHA SURECLICK) 462 MG/ML SOAJ INJECT 1 PEN SUBCUTANEOUSLY EVERY 14 DAYS 6 mL 3   FeFum-FePo-FA-B Cmp-C-Zn-Mn-Cu (SE-TAN PLUS) 162-115.2-1 MG CAPS TAKE ONE CAPSULE BY MOUTH TWICE A DAY 180 capsule 0   fexofenadine (ALLEGRA) 180 MG tablet Take  180 mg by mouth daily as needed for allergies or rhinitis.     fluticasone (FLONASE) 50 MCG/ACT nasal spray USE 2 SPRAYS IN EACH NOSTRIL EVERY DAY 48 g 3   folic acid (FOLVITE) 1 MG tablet Take 1 mg by mouth daily.     glucose blood test strip Test blood sugar once per day 100 each 3   HYDROcodone-acetaminophen (NORCO/VICODIN) 5-325 MG tablet Take 1-2 tablets by mouth every 6 (six) hours as needed for moderate pain. 15 tablet 0   HYDROcodone-acetaminophen (NORCO/VICODIN) 5-325 MG tablet Take 1-2 tablets by mouth every 6 (six) hours as needed for moderate pain. 15 tablet 0   Levothyroxine Sodium 150 MCG CAPS Take 150-300 mcg by mouth See admin instructions. Take 150 mcg every day EXCEPT: Monday take 300 mcg      loperamide (IMODIUM) 2 MG capsule Take 4 mg by mouth daily as needed for diarrhea or loose stools.      methotrexate 2.5 MG tablet Take 10 mg by mouth once a week. Caution: Chemotherapy. Protect from light.     montelukast (SINGULAIR) 10 MG tablet Take 1 tablet (10 mg total) by mouth at bedtime. 90 tablet 3   nitroGLYCERIN (NITROSTAT) 0.4 MG SL tablet Place 1 tablet (0.4 mg total) under the tongue every 5 (five) minutes as needed for chest pain. 50 tablet 3   Omega-3 Fatty Acids (FISH OIL) 1000 MG CAPS Take 1,000 mg by mouth daily.     Propylene Glycol (SYSTANE COMPLETE OP) Place 1 drop into both eyes in the morning.     TRUEplus Lancets 30G MISC Check blood sugar once per day 100 each 3   No current facility-administered medications for this visit.     Objective:  BP (!) 148/80   Pulse 63   Temp 98 F (36.7 C)   Ht '5\' 1"'$  (1.549 m)   Wt 227 lb 6.4 oz (103.1 kg)   SpO2 99%   BMI 42.97 kg/m  Gen: NAD, resting comfortably Healing scar at neck CV: RRR stable murmur Lungs: CTAB no crackles, wheeze, rhonchi Ext: trace edema- goes down at night Skin: warm, dry    Assessment and Plan   #Hyperparathyroidism-had parathyroidectomy with Dr. Harlow Asa 1.4 cm left inferior on 03/16/2021-Per endocrinology visit on 03/20/2018 reported more energy and improved walking and activity.   -PTH trending down 4 weeks ago but still high.  We will recheck this today -Has had osteoporosis likely related to this at least in part   #hypothyroidism due to Hashimoto's- follows with Endocrine S: compliant On thyroid medication-2 recent was decreased to 1 capsule daily from 2 on Monday and 1 all other days at most recent visit with endocrinology on 04/09/2021-she reports stabilized on 05/14/21 labs , free t4 was hair high but plan to continue same dose Lab Results  Component Value Date   TSH 0.33 (L) 02/19/2021   A/P:improved- does not need repeat labs today- continue current meds and endocrine follow up   %  Polymyalgia rheumatica-continues follow-up with Dr. Amil Amen and on prednisone long term- now off since after sugery- 2 months or so .   #hyperlipidemia #CAD/aortic atherosclerosis incidentally noted #Mild aortic stenosis with murmur-stable on exam again today  S: Medication: Repatha 140 mg/mL every 14 days, aspirin 81 mg -Denies chest pain.  Prior stable shortness of breath with activity such as going up stairs- slightly improved after surgery Lab Results  Component Value Date   CHOL 130 11/16/2020   HDL 60.10 11/16/2020   [place]  45 11/16/2020   LDLDIRECT 60.0 05/31/2019   TRIG 128.0 11/16/2020   CHOLHDL 2 11/16/2020   A/P: CAD and aortic atherosclerosis largely asymptomatic- continue long term repatha and aspirin.  -stenosis with stable murmur -lipids at goal recently- no need for repeat   # Diabetes S: Medication: Diet/exercise controlled -trying to keep unhealthy foods out of house- weight up some but did have surgeyr recently Lab Results  Component Value Date   HGBA1C 6.7 (H) 02/19/2021   HGBA1C 6.9 (H) 11/16/2020   HGBA1C 6.8 (H) 07/11/2020   A/P:  hopefully stable- update a1c today. Continue without meds for now as long as under 7- now off steroids may help  #hypertension S: medication: Amlodipine 10 mg Home readings #s: varies at home from 130s to 160s BP Readings from Last 3 Encounters:  06/11/21 (!) 148/80  03/16/21 (!) 171/82  03/01/21 (!) 169/84  A/P: poor control. Continue amlodipine- Try metoprolol extended release 12.5 mg (listed as a full pill but want to make sure your heart rate doesn't get too low). We could try medicine like valsartan but with prior reaction to ACE-I I am somewhat cautious to start there -1 month follow up unless simply looks excellent at home   # Depression S: Medication:Pristiq 100 mg daily. Can wake up at 2 30 am sometimes and has thrown sleep off some -Lost best friend last April- doing better overall- recently talked to her best  friends widowed husband     06/11/2021   10:03 AM 02/16/2021   11:37 AM 02/12/2021    1:51 PM  Depression screen PHQ 2/9  Decreased Interest 0 0 0  Down, Depressed, Hopeless 1 0 0  PHQ - 2 Score 1 0 0  Altered sleeping 2 0   Tired, decreased energy 3 1   Change in appetite 0 0   Feeling bad or failure about yourself  0 0   Trouble concentrating 0 1   Moving slowly or fidgety/restless 0 0   Suicidal thoughts 0 0   PHQ-9 Score 6 2   Difficult doing work/chores Not difficult at all Not difficult at all   A/P: reasonable control overall- some poor sleep and most of her points are related to that- she feels overall controlled- continue to monitor   # B12 deficiency S: Current treatment/medication (oral vs. IM): Monthly injections at home but occasionally forgets Lab Results  Component Value Date   VITAMINB12 464 02/19/2021  A/P: Controlled. Continue current medications.    Asthma/allergies-overall well controlled on Singulair-aware of potential mood issues  #Fibromyalgia/bilateral knee pain/low back pain-remains on diclofenac with sparing hydrocodone (last fill last June- #40- will refill today- pdmp low risk- did receive some meds after surgery #15)-need to continue to monitor renal function closely . Was also started on methotrexate.   Recommended follow up: Return in about 1 month (around 07/12/2021) for followup or sooner if needed.Schedule b4 you leave. Future Appointments  Date Time Provider [place]  02/25/2022  2:30 PM [facility]   Lab/Order associations:   ICD-10-CM   1. Well controlled type 2 diabetes mellitus ([address])  E11.9 Hemoglobin A1c    Microalbumin / creatinine urine ratio    2. Coronary artery disease involving native coronary artery of native heart without angina pectoris  I25.10     3. Mild aortic stenosis  I35.0     4. Aortic atherosclerosis (HCC)  I70.0     5. Hyperlipidemia associated with type 2 diabetes mellitus ([place])  E11.69 CBC with Differential/Platelet   E78.5 Comprehensive metabolic panel    6. Essential hypertension  I10     7. Hyperparathyroidism, primary ([place])  E21.0 PTH, intact (no Ca)    8. Myalgia  M79.10     9. B12 deficiency  E53.8     10. Hypothyroidism, unspecified type  E03.9       Meds ordered this encounter  Medications   metoprolol succinate (TOPROL-XL) 25 MG 24 hr tablet    Sig: Take 1 tablet (25 mg total) by mouth daily.    Dispense:  30 tablet    Refill:  5    Return precautions advised.  Garret Reddish, MD

## 2021-06-11 NOTE — Patient Instructions (Addendum)
Try metoprolol extended release 12.5 mg (listed as a full pill but want to make sure your heart rate doesn't get too low). We could try medicine like valsartan but with prior reaction to ACE-I I am somewhat cautious to start there -if heart rate going below 50 I may need to change  Please stop by lab before you go If you have mychart- we will send your results within 3 business days of [place] receiving them.  If you do not have mychart- we will call you about results within 5 business days of [place] receiving them.  *please also note that you will see labs on mychart as soon as they post. I will later go in and write notes on them- will say "notes from Dr. Yong Channel"   Recommended follow up: Return in about 1 month (around 07/12/2021) for followup or sooner if needed.Schedule b4 you leave.

## 2021-06-12 LAB — PARATHYROID HORMONE, INTACT (NO CA): PTH: 32 pg/mL (ref 16–77)

## 2021-06-25 ENCOUNTER — Encounter: Payer: Self-pay | Admitting: Family Medicine

## 2021-07-13 ENCOUNTER — Other Ambulatory Visit: Payer: Self-pay | Admitting: Family Medicine

## 2021-07-13 ENCOUNTER — Other Ambulatory Visit (HOSPITAL_COMMUNITY): Payer: Self-pay

## 2021-07-14 ENCOUNTER — Other Ambulatory Visit: Payer: Self-pay | Admitting: Family Medicine

## 2021-07-14 ENCOUNTER — Other Ambulatory Visit (HOSPITAL_COMMUNITY): Payer: Self-pay

## 2021-07-16 ENCOUNTER — Encounter: Payer: Self-pay | Admitting: Family Medicine

## 2021-07-16 ENCOUNTER — Other Ambulatory Visit: Payer: Self-pay | Admitting: Family Medicine

## 2021-07-16 MED ORDER — SE-TAN PLUS 162-115.2-1 MG PO CAPS: 1.0000 | ORAL_CAPSULE | Freq: Two times a day (BID) | ORAL | 0 refills | Status: DC

## 2021-07-19 ENCOUNTER — Encounter: Payer: Self-pay | Admitting: Family Medicine

## 2021-07-19 ENCOUNTER — Ambulatory Visit (INDEPENDENT_AMBULATORY_CARE_PROVIDER_SITE_OTHER): Payer: Medicare HMO | Admitting: Family Medicine

## 2021-07-19 VITALS — BP 122/72 | HR 69 | Temp 97.7°F | Ht 61.0 in | Wt 227.4 lb

## 2021-07-19 DIAGNOSIS — J301 Allergic rhinitis due to pollen: Secondary | ICD-10-CM

## 2021-07-19 DIAGNOSIS — I10 Essential (primary) hypertension: Secondary | ICD-10-CM

## 2021-07-19 DIAGNOSIS — J4521 Mild intermittent asthma with (acute) exacerbation: Secondary | ICD-10-CM

## 2021-07-19 MED ORDER — ALBUTEROL SULFATE HFA 108 (90 BASE) MCG/ACT IN AERS: 1.0000 | INHALATION_SPRAY | RESPIRATORY_TRACT | 2 refills | Status: DC | PRN

## 2021-07-19 MED ORDER — NITROGLYCERIN 0.4 MG SL SUBL: 0.4000 mg | SUBLINGUAL_TABLET | SUBLINGUAL | 3 refills | Status: AC | PRN

## 2021-07-19 NOTE — Patient Instructions (Addendum)
No changes today- thrilled you are doing so much better on blood pressure even without the metoprolol and that metoprolol side effects have resolved by coming off med  Recommended follow up: Return in about 5 months (around 12/19/2021) for physical or sooner if needed.Schedule b4 you leave.

## 2021-07-19 NOTE — Assessment & Plan Note (Signed)
S: medication: amlodipine 10 mg -metoprolol 12.5 mg XR tried last visit but had edema, fatigue, headache, low HR into 40s -ace-I reaction in past and hesitant on ARB as a result Home readings #s: 130s to 160s prior to last visit- more recently highest of 147/80 but usually in 120s or 130s BP Readings from Last 3 Encounters:  07/19/21 122/72  06/11/21 (!) 148/80  03/16/21 (!) 171/82  A/P: much improved even despite not taking metoprolol- unclear what brought BP down but did stop singulair recently a few weeks ago- possible correlation? For now continue amlodipine 10 mg alone

## 2021-07-19 NOTE — Progress Notes (Signed)
Phone [phone] In person visit   Subjective:   [name] is a 81 y.o. year old very pleasant female patient who presents for/with See problem oriented charting Chief Complaint  Patient presents with   Follow-up   Diabetes   Hypertension    Past Medical History-  Patient Active Problem List   Diagnosis Date Noted   Osteoporosis 10/29/2020    Priority: High   History of polymyalgia rheumatica 03/16/2018    Priority: High   CAD (coronary artery disease) 11/19/2017    Priority: High   Hypercalcemia 12/29/2014    Priority: High   OA (osteoarthritis) of knee 10/05/2013    Priority: High   Well controlled type 2 diabetes mellitus ([place]) 07/18/2006    Priority: High   Mild aortic stenosis 11/16/2020    Priority: Medium    B12 deficiency 05/31/2019    Priority: Medium    Overactive bladder 09/03/2016    Priority: Medium    Morbid obesity ([place]) 06/28/2015    Priority: Medium    History of gastric bypass 02/08/2014    Priority: Medium    Hyperlipidemia associated with type 2 diabetes mellitus ([place]) 01/05/2008    Priority: Medium    Hypothyroidism 09/09/2007    Priority: Medium    Major depression, recurrent, full remission ([place]) 07/18/2006    Priority: Medium    Essential hypertension 07/18/2006    Priority: Medium    Asthma 07/18/2006    Priority: Medium    Primary osteoarthritis of right knee 06/28/2019    Priority: Low   Allergic rhinitis 07/20/2014    Priority: Low   CHEST WALL PAIN, ANTERIOR 10/09/2007    Priority: Low   Hyperparathyroidism, primary ([place]) 03/14/2021   Aortic atherosclerosis ([place]) 02/16/2021    Medications- reviewed and updated Current Outpatient Medications  Medication Sig Dispense Refill   acetaminophen (TYLENOL) 500 MG tablet Take 1,000 mg by mouth at bedtime as needed for mild pain. Can take up to three times day     amLODipine (NORVASC) 10 MG tablet TAKE 1 TABLET EVERY DAY (Patient taking differently: Take 10 mg by mouth at  bedtime.) 90 tablet 2   aspirin EC 81 MG tablet Take 81 mg by mouth daily. Swallow whole.     Biotin 5000 MCG CAPS Take 5,000 mcg by mouth daily at 2 am.     Cholecalciferol (VITAMIN D-3) 125 MCG (5000 UT) TABS Take 5,000 Units by mouth daily.     cyanocobalamin (,VITAMIN B-12,) 1000 MCG/ML injection INJECT 1ML (1,000 MCG TOTAL)  INTO THE MUSCLE EVERY 30 DAYS USE ON A MONTHLY BASES 10 mL 0   desvenlafaxine (PRISTIQ) 100 MG 24 hr tablet TAKE 1 TABLET EVERY DAY 90 tablet 2   diclofenac (VOLTAREN) 75 MG EC tablet Take 1 tablet (75 mg total) by mouth 2 (two) times daily. 180 tablet 3   Evolocumab (REPATHA SURECLICK) 751 MG/ML SOAJ INJECT 1 PEN SUBCUTANEOUSLY EVERY 14 DAYS 6 mL 3   FeFum-FePo-FA-B Cmp-C-Zn-Mn-Cu (SE-TAN PLUS) 162-115.2-1 MG CAPS Take 1 capsule by mouth 2 (two) times daily. 180 capsule 0   fexofenadine (ALLEGRA) 180 MG tablet Take 180 mg by mouth daily as needed for allergies or rhinitis.     folic acid (FOLVITE) 1 MG tablet Take 1 mg by mouth daily.     glucose blood test strip Test blood sugar once per day 100 each 3   HYDROcodone-acetaminophen (NORCO/VICODIN) 5-325 MG tablet Take 1-2 tablets by mouth every 6 (six) hours as needed for moderate pain.  40 tablet 0   Levothyroxine Sodium 150 MCG CAPS Take 150-300 mcg by mouth See admin instructions. Take 150 mcg every day EXCEPT: Monday take 300 mcg     loperamide (IMODIUM) 2 MG capsule Take 4 mg by mouth daily as needed for diarrhea or loose stools.      methotrexate 2.5 MG tablet Take 10 mg by mouth once a week. Caution: Chemotherapy. Protect from light.     Omega-3 Fatty Acids (FISH OIL) 1000 MG CAPS Take 1,000 mg by mouth daily.     Propylene Glycol (SYSTANE COMPLETE OP) Place 1 drop into both eyes in the morning.     TRUEplus Lancets 30G MISC Check blood sugar once per day 100 each 3   albuterol (VENTOLIN HFA) 108 (90 Base) MCG/ACT inhaler Inhale 1-2 puffs into the lungs every 4 (four) hours as needed for wheezing or shortness of  breath. 18 g 2   fluticasone (FLONASE) 50 MCG/ACT nasal spray USE 2 SPRAYS IN EACH NOSTRIL EVERY DAY 48 g 3   nitroGLYCERIN (NITROSTAT) 0.4 MG SL tablet Place 1 tablet (0.4 mg total) under the tongue every 5 (five) minutes as needed for chest pain. 50 tablet 3   No current facility-administered medications for this visit.     Objective:  BP 122/72   Pulse 69   Temp 97.7 F (36.5 C)   Ht '5\' 1"'$  (1.549 m)   Wt 227 lb 6.4 oz (103.1 kg)   SpO2 95%   BMI 42.97 kg/m  Gen: NAD, resting comfortably CV: RRR no murmurs rubs or gallops Lungs: CTAB no crackles, wheeze, rhonchi Ext: trace edema Skin: warm, dry    Assessment and Plan   #hypertension S: medication: amlodipine 10 mg -metoprolol 12.5 mg XR tried last visit but had edema, fatigue, headache, low HR into 40s -ace-I reaction in past and hesitant on ARB as a result Home readings #s: 130s to 160s prior to last visit- more recently highest of 147/80 but usually in 120s or 130s BP Readings from Last 3 Encounters:  07/19/21 122/72  06/11/21 (!) 148/80  03/16/21 (!) 171/82  A/P: much improved even despite not taking metoprolol- unclear what brought BP down but did stop singulair recently a few weeks ago- possible correlation? For now continue amlodipine 10 mg alone -edema much better off of the metoprolol 12.5 mg   # Allergies S:meds: does either claritin or allegra- considering alternating poor control- watery eyes matted in AM -has seen allergist  - came off singulair as did not feel was helping but has not really changed her symptoms  - prefers to take flonase  A/P: not ideal control but not tolerating many of the meds- want sto continue current claritin or allegra and follow up with allergist- continue to monitor  Recommended follow up: Return in about 5 months (around 12/19/2021) for physical or sooner if needed.Schedule b4 you leave. Future Appointments  Date Time Provider [place]  02/25/2022  2:30 PM [facility]  HEALTH COACH [facility]    Lab/Order associations:   ICD-10-CM   1. Essential hypertension  I10     2. Allergic rhinitis due to pollen, unspecified seasonality  J30.1     3. Mild intermittent asthma with acute exacerbation  J45.21 albuterol (VENTOLIN HFA) 108 (90 Base) MCG/ACT inhaler      Meds ordered this encounter  Medications   albuterol (VENTOLIN HFA) 108 (90 Base) MCG/ACT inhaler    Sig: Inhale 1-2 puffs into the lungs every 4 (four) hours as  needed for wheezing or shortness of breath.    Dispense:  18 g    Refill:  2   nitroGLYCERIN (NITROSTAT) 0.4 MG SL tablet    Sig: Place 1 tablet (0.4 mg total) under the tongue every 5 (five) minutes as needed for chest pain.    Dispense:  50 tablet    Refill:  3    Return precautions advised.  Garret Reddish, MD

## 2021-08-23 DIAGNOSIS — Z7952 Long term (current) use of systemic steroids: Secondary | ICD-10-CM | POA: Diagnosis not present

## 2021-08-23 DIAGNOSIS — M353 Polymyalgia rheumatica: Secondary | ICD-10-CM | POA: Diagnosis not present

## 2021-08-23 DIAGNOSIS — M1991 Primary osteoarthritis, unspecified site: Secondary | ICD-10-CM | POA: Diagnosis not present

## 2021-08-23 DIAGNOSIS — M5136 Other intervertebral disc degeneration, lumbar region: Secondary | ICD-10-CM | POA: Diagnosis not present

## 2021-08-23 DIAGNOSIS — Z6841 Body Mass Index (BMI) 40.0 and over, adult: Secondary | ICD-10-CM | POA: Diagnosis not present

## 2021-08-31 ENCOUNTER — Telehealth: Payer: Self-pay | Admitting: Pharmacist

## 2021-08-31 NOTE — Progress Notes (Signed)
Chronic Care Management Pharmacy Assistant   Name: [name]  MRN: [id number] DOB: February 26, 1940  Reason for Encounter: Hypertension Adherence Call    Recent office visits:  07/19/2021 OV (PCP) Marin Olp, MD; discontinue Metoprolol due to fatigue, HA, low HR into 40s - came off singulair as did not feel was helping but has not really changed her symptoms   06/11/2021 OV (PCP) Marin Olp, MD; Try metoprolol extended release 12.5 mg (listed as a full pill but want to make sure your heart rate doesn't get too low). We could try medicine like valsartan but with prior reaction to ACE-I I am somewhat cautious to start there.  04/17/2021 OV (Endocrinology) Lanney Gins, MD; no medication changes indicated.  Recent consult visits:  None  Hospital visits:  None in previous 6 months  Medications: Outpatient Encounter Medications as of 08/31/2021  Medication Sig   acetaminophen (TYLENOL) 500 MG tablet Take 1,000 mg by mouth at bedtime as needed for mild pain. Can take up to three times day   albuterol (VENTOLIN HFA) 108 (90 Base) MCG/ACT inhaler Inhale 1-2 puffs into the lungs every 4 (four) hours as needed for wheezing or shortness of breath.   amLODipine (NORVASC) 10 MG tablet TAKE 1 TABLET EVERY DAY (Patient taking differently: Take 10 mg by mouth at bedtime.)   aspirin EC 81 MG tablet Take 81 mg by mouth daily. Swallow whole.   Biotin 5000 MCG CAPS Take 5,000 mcg by mouth daily at 2 am.   Cholecalciferol (VITAMIN D-3) 125 MCG (5000 UT) TABS Take 5,000 Units by mouth daily.   cyanocobalamin (,VITAMIN B-12,) 1000 MCG/ML injection INJECT 1ML (1,000 MCG TOTAL)  INTO THE MUSCLE EVERY 30 DAYS USE ON A MONTHLY BASES   desvenlafaxine (PRISTIQ) 100 MG 24 hr tablet TAKE 1 TABLET EVERY DAY   diclofenac (VOLTAREN) 75 MG EC tablet Take 1 tablet (75 mg total) by mouth 2 (two) times daily.   Evolocumab (REPATHA SURECLICK) 277 MG/ML SOAJ INJECT 1 PEN SUBCUTANEOUSLY EVERY 14  DAYS   FeFum-FePo-FA-B Cmp-C-Zn-Mn-Cu (SE-TAN PLUS) 162-115.2-1 MG CAPS Take 1 capsule by mouth 2 (two) times daily.   fexofenadine (ALLEGRA) 180 MG tablet Take 180 mg by mouth daily as needed for allergies or rhinitis.   folic acid (FOLVITE) 1 MG tablet Take 1 mg by mouth daily.   glucose blood test strip Test blood sugar once per day   HYDROcodone-acetaminophen (NORCO/VICODIN) 5-325 MG tablet Take 1-2 tablets by mouth every 6 (six) hours as needed for moderate pain.   Levothyroxine Sodium 150 MCG CAPS Take 150-300 mcg by mouth See admin instructions. Take 150 mcg every day EXCEPT: Monday take 300 mcg   loperamide (IMODIUM) 2 MG capsule Take 4 mg by mouth daily as needed for diarrhea or loose stools.    methotrexate 2.5 MG tablet Take 10 mg by mouth once a week. Caution: Chemotherapy. Protect from light.   nitroGLYCERIN (NITROSTAT) 0.4 MG SL tablet Place 1 tablet (0.4 mg total) under the tongue every 5 (five) minutes as needed for chest pain.   Omega-3 Fatty Acids (FISH OIL) 1000 MG CAPS Take 1,000 mg by mouth daily.   Propylene Glycol (SYSTANE COMPLETE OP) Place 1 drop into both eyes in the morning.   TRUEplus Lancets 30G MISC Check blood sugar once per day   No facility-administered encounter medications on file as of 08/31/2021.   Reviewed chart prior to disease state call. Spoke with patient regarding BP  Recent Office Vitals:  BP Readings from Last 3 Encounters:  07/19/21 122/72  06/11/21 (!) 148/80  03/16/21 (!) 171/82   Pulse Readings from Last 3 Encounters:  07/19/21 69  06/11/21 63  03/16/21 65    Wt Readings from Last 3 Encounters:  07/19/21 227 lb 6.4 oz (103.1 kg)  06/11/21 227 lb 6.4 oz (103.1 kg)  03/16/21 220 lb 3.2 oz (99.9 kg)     Kidney Function Lab Results  Component Value Date/Time   CREATININE 0.92 06/11/2021 10:57 AM   CREATININE 0.99 02/19/2021 11:15 AM   CREATININE 0.86 10/19/2019 11:34 AM   GFR 58.51 (L) 06/11/2021 10:57 AM   GFRNONAA 64 10/19/2019  11:34 AM   GFRAA 74 10/19/2019 11:34 AM       Latest Ref Rng & Units 06/11/2021   10:57 AM 02/19/2021   11:15 AM 11/16/2020   10:42 AM  BMP  Glucose 70 - 99 mg/dL 116  150  113   BUN 6 - 23 mg/dL '10  11  14   '$ Creatinine 0.40 - 1.20 mg/dL 0.92  0.99  0.97   Sodium 135 - 145 mEq/L 137  137  137   Potassium 3.5 - 5.1 mEq/L 4.3  4.3  4.6   Chloride 96 - 112 mEq/L 100  102  100   CO2 19 - 32 mEq/L 31  27  32   Calcium 8.4 - 10.5 mg/dL 9.0  9.8  10.1     Current antihypertensive regimen:  Amlodipine 10 mg daily  How often are you checking your Blood Pressure? 1-2x per week  Current home BP readings: 130/70-80  What recent interventions/DTPs have been made by any provider to improve Blood Pressure control since last CPP Visit: Patient took a trail of Metoprolol that was discontinued due to edema, HA's and low heart rate.  Any recent hospitalizations or ED visits since last visit with CPP? No  What diet changes have been made to improve Blood Pressure Control?  Patient states she has been trying to cut down on sweet and increasing vegetable intake.  What exercise is being done to improve your Blood Pressure Control?  Patient states she is currently unable to exercise.  Adherence Review: Is the patient currently on ACE/ARB medication? No Does the patient have >5 day gap between last estimated fill dates? No  -Patient states she would like to try Aqua therapy. She wants to know if Dr. Yong Channel has any suggestions or recommendations for where she should try Aqua therapy.  -Patient states she was recently given a Prednisone dosepak from her Rheumatologist. He also increased her methotrexate to 6 tablets a week. Patient states she can not tell if the prednisone has helped at all. She believes her pain may be osteoarthritis.  -Patient wants to know if she can have another bone density test?  Care Gaps: Medicare Annual Wellness: Completed 02/12/21 Ophthalmology Exam: Next due on  12/27/2021 Foot Exam: Next due on 02/16/2022 Hemoglobin A1C: 6.4% on 06/11/2021 Colonoscopy: Completed 10/19/2014 Dexa Scan: Completed Mammogram: Aged out, last completed 11/06/2016  Future Appointments  Date Time Provider [place]  01/08/2022  2:00 PM Marin Olp, MD [facility]  02/25/2022  2:30 PM [facility] HEALTH COACH [facility]   Star Rating Drugs: None  April D Calhoun, [place] Pharmacist Assistant [phone]

## 2021-08-31 NOTE — Telephone Encounter (Signed)
She could potentially do this early but I believe it would be out-of-pocket cost unfortunately and I believe test is over $500

## 2021-08-31 NOTE — Chronic Care Management (AMB) (Signed)
Last DEXA (bone density) showed osteoporosis in October 2022.  Results were faxed to Dr. Grandville Silos.  Did patient ever start treatment for this?  She would not be due for another scan until 2024 as they are every two years.  Dr. Yong Channel may do referral for aqua therapy but she would need to come see him for a visit for referral.  Beverly Milch, PharmD Clinical Pharmacist  [facility] [phone]

## 2021-09-03 ENCOUNTER — Other Ambulatory Visit: Payer: Self-pay

## 2021-09-05 NOTE — Telephone Encounter (Signed)
Called and lm on pt vm and mychart message sent also.

## 2021-09-12 ENCOUNTER — Other Ambulatory Visit: Payer: Self-pay | Admitting: Family Medicine

## 2021-10-05 ENCOUNTER — Other Ambulatory Visit (HOSPITAL_COMMUNITY): Payer: Self-pay

## 2021-10-07 ENCOUNTER — Other Ambulatory Visit: Payer: Self-pay | Admitting: Interventional Cardiology

## 2021-10-07 ENCOUNTER — Encounter: Payer: Self-pay | Admitting: Interventional Cardiology

## 2021-10-15 ENCOUNTER — Encounter: Payer: Self-pay | Admitting: Family Medicine

## 2021-10-15 ENCOUNTER — Encounter: Payer: Self-pay | Admitting: *Deleted

## 2021-10-18 ENCOUNTER — Other Ambulatory Visit: Payer: Self-pay | Admitting: Family Medicine

## 2021-10-19 ENCOUNTER — Encounter: Payer: Self-pay | Admitting: Family Medicine

## 2021-10-22 DIAGNOSIS — E559 Vitamin D deficiency, unspecified: Secondary | ICD-10-CM | POA: Diagnosis not present

## 2021-10-22 DIAGNOSIS — M810 Age-related osteoporosis without current pathological fracture: Secondary | ICD-10-CM | POA: Diagnosis not present

## 2021-10-22 DIAGNOSIS — E210 Primary hyperparathyroidism: Secondary | ICD-10-CM | POA: Diagnosis not present

## 2021-10-30 DIAGNOSIS — R748 Abnormal levels of other serum enzymes: Secondary | ICD-10-CM | POA: Diagnosis not present

## 2021-10-30 DIAGNOSIS — E559 Vitamin D deficiency, unspecified: Secondary | ICD-10-CM | POA: Diagnosis not present

## 2021-10-30 DIAGNOSIS — Z79890 Hormone replacement therapy (postmenopausal): Secondary | ICD-10-CM | POA: Diagnosis not present

## 2021-10-30 DIAGNOSIS — E038 Other specified hypothyroidism: Secondary | ICD-10-CM | POA: Diagnosis not present

## 2021-10-30 DIAGNOSIS — E063 Autoimmune thyroiditis: Secondary | ICD-10-CM | POA: Diagnosis not present

## 2021-10-30 DIAGNOSIS — E210 Primary hyperparathyroidism: Secondary | ICD-10-CM | POA: Diagnosis not present

## 2021-10-30 DIAGNOSIS — M810 Age-related osteoporosis without current pathological fracture: Secondary | ICD-10-CM | POA: Diagnosis not present

## 2021-11-06 ENCOUNTER — Ambulatory Visit (INDEPENDENT_AMBULATORY_CARE_PROVIDER_SITE_OTHER): Payer: Medicare HMO | Admitting: Family Medicine

## 2021-11-06 ENCOUNTER — Encounter: Payer: Self-pay | Admitting: Family Medicine

## 2021-11-06 ENCOUNTER — Other Ambulatory Visit (HOSPITAL_BASED_OUTPATIENT_CLINIC_OR_DEPARTMENT_OTHER): Payer: Self-pay | Admitting: Internal Medicine

## 2021-11-06 VITALS — BP 136/78 | HR 74 | Temp 97.9°F | Ht 61.0 in | Wt 224.4 lb

## 2021-11-06 DIAGNOSIS — M25511 Pain in right shoulder: Secondary | ICD-10-CM

## 2021-11-06 DIAGNOSIS — M542 Cervicalgia: Secondary | ICD-10-CM | POA: Diagnosis not present

## 2021-11-06 DIAGNOSIS — I10 Essential (primary) hypertension: Secondary | ICD-10-CM

## 2021-11-06 DIAGNOSIS — M810 Age-related osteoporosis without current pathological fracture: Secondary | ICD-10-CM
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
## 2021-11-21 DIAGNOSIS — M5136 Other intervertebral disc degeneration, lumbar region: Secondary | ICD-10-CM | POA: Diagnosis not present

## 2021-11-21 DIAGNOSIS — Z7952 Long term (current) use of systemic steroids: Secondary | ICD-10-CM | POA: Diagnosis not present

## 2021-11-21 DIAGNOSIS — M1991 Primary osteoarthritis, unspecified site: Secondary | ICD-10-CM | POA: Diagnosis not present

## 2021-11-21 DIAGNOSIS — Z6841 Body Mass Index (BMI) 40.0 and over, adult: Secondary | ICD-10-CM | POA: Diagnosis not present

## 2021-11-21 DIAGNOSIS — M65331 Trigger finger, right middle finger: Secondary | ICD-10-CM | POA: Diagnosis not present

## 2021-11-21 DIAGNOSIS — M353 Polymyalgia rheumatica: Secondary | ICD-10-CM | POA: Diagnosis not present

## 2021-12-06 ENCOUNTER — Telehealth: Payer: Self-pay | Admitting: Pharmacist

## 2021-12-06 NOTE — Progress Notes (Signed)
Chronic Care Management Pharmacy Assistant   Name: KIYANNA BIEGLER  MRN: [id number] DOB: 1940-06-30   Reason for Encounter: General Adherence Call    Recent office visits:  11/06/2021 OV (PCP) Marin Olp, MD; no medication changes noted.  Recent consult visits:  10/30/2021 OV (Endo) Lanney Gins, MD; no medication changes noted.  Hospital visits:  None in previous 6 months  Medications: Outpatient Encounter Medications as of 12/06/2021  Medication Sig   acetaminophen (TYLENOL) 500 MG tablet Take 1,000 mg by mouth at bedtime as needed for mild pain. Can take up to three times day   albuterol (VENTOLIN HFA) 108 (90 Base) MCG/ACT inhaler Inhale 1-2 puffs into the lungs every 4 (four) hours as needed for wheezing or shortness of breath.   amLODipine (NORVASC) 10 MG tablet TAKE 1 TABLET EVERY DAY   aspirin EC 81 MG tablet Take 81 mg by mouth daily. Swallow whole.   Biotin 5000 MCG CAPS Take 5,000 mcg by mouth daily at 2 am.   Cholecalciferol (VITAMIN D-3) 125 MCG (5000 UT) TABS Take 5,000 Units by mouth daily.   cyanocobalamin (,VITAMIN B-12,) 1000 MCG/ML injection INJECT 1ML (1,000 MCG TOTAL)  INTO THE MUSCLE EVERY 30 DAYS USE ON A MONTHLY BASES   desvenlafaxine (PRISTIQ) 100 MG 24 hr tablet TAKE 1 TABLET EVERY DAY   diclofenac (VOLTAREN) 75 MG EC tablet Take 1 tablet (75 mg total) by mouth 2 (two) times daily.   Evolocumab (REPATHA SURECLICK) 086 MG/ML SOAJ INJECT 1 PEN SUBCUTANEOUSLY EVERY TWO WEEKS   FeFum-FePo-FA-B Cmp-C-Zn-Mn-Cu (SE-TAN PLUS) 162-115.2-1 MG CAPS TAKE 1 CAPSULE BY MOUTH TWICE A DAY   fexofenadine (ALLEGRA) 180 MG tablet Take 180 mg by mouth daily as needed for allergies or rhinitis.   folic acid (FOLVITE) 1 MG tablet Take 1 mg by mouth daily.   glucose blood test strip Test blood sugar once per day   HYDROcodone-acetaminophen (NORCO/VICODIN) 5-325 MG tablet Take 1-2 tablets by mouth every 6 (six) hours as needed for moderate pain.    Levothyroxine Sodium 150 MCG CAPS Take 150-300 mcg by mouth See admin instructions. Take 150 mcg every day EXCEPT: Monday take 300 mcg   loperamide (IMODIUM) 2 MG capsule Take 4 mg by mouth daily as needed for diarrhea or loose stools.    nitroGLYCERIN (NITROSTAT) 0.4 MG SL tablet Place 1 tablet (0.4 mg total) under the tongue every 5 (five) minutes as needed for chest pain.   Omega-3 Fatty Acids (FISH OIL) 1000 MG CAPS Take 1,000 mg by mouth daily.   Propylene Glycol (SYSTANE COMPLETE OP) Place 1 drop into both eyes in the morning.   TRUEplus Lancets 30G MISC Check blood sugar once per day   No facility-administered encounter medications on file as of 12/06/2021.   [place] for General Review Call   Chart Review:  Have there been any documented new, changed, or discontinued medications since last visit? No  Has there been any documented recent hospitalizations or ED visits since last visit with Clinical Pharmacist? No   Adherence Review:  Does the Clinical Pharmacist Assistant have access to adherence rates? Yes Adherence rates for STAR metric medications: None Does the patient have >5 day gap between last estimated fill dates for any of the above medications or other medication gaps? No Reason for medication gaps: None   Disease State Questions:  Able to connect with Patient? Yes Did patient have any problems with their health recently? Yes Note problems and  Concerns: Arthritis Pain Have you had any admissions or emergency room visits or worsening of your condition(s) since last visit? No Have you had any visits with new specialists or providers since your last visit? No Have you had any new health care problem(s) since your last visit? No Have you run out of any of your medications since you last spoke with clinical pharmacist? No Are there any medications you are not taking as prescribed? No Are you having any issues or side effects with your medications?  Yes Note of issues or side effects: Patient states she has had a "reaction" to methotrexate and is unable to take it. Do you have any other health concerns or questions you want to discuss with your Clinical Pharmacist before your next visit? Yes Note additional concerns and questions from Patient. Patient would like to discuss her pain management plan for her arthritis. Are there any health concerns that you feel we can do a better job addressing? No Are you having any problems with any of the following since the last visit: (select all that apply)  None 12. Any falls since last visit? No 13. Any increased or uncontrolled pain since last visit? Yes  Patient states she has a lot of arthritis pain. She is currently seeing a  rheumatologist in which she has tried and failed methotrexate. She states she was given trial of prednisone for days she has severe flares. She also takes Hydrocodone for severe pain as needed after trying tylenol and diclofenac first. Patient is requesting for the pharmacist to give her a call this afternoon to discuss.  Care Gaps: Medicare Annual Wellness: Completed 02/12/2021 Ophthalmology Exam: Next due on 12/27/2021 Foot Exam: Next due on 02/16/2022 GFR measurement: Abnormal on 06/11/2021 Urine ACR: WNL on 06/11/2021 Hemoglobin A1C: 6.4% on 06/11/2021 Dexa Scan: Ordered on 11/06/2021  Future Appointments  Date Time Provider [place]  01/08/2022  2:00 PM Marin Olp, MD [facility]  02/25/2022  2:30 PM [facility] HEALTH COACH [facility]   Star Rating Drugs: None  April D Calhoun, [place] Pharmacist Assistant [phone]

## 2022-01-02 ENCOUNTER — Encounter: Payer: Self-pay | Admitting: Interventional Cardiology

## 2022-01-02 DIAGNOSIS — E119 Type 2 diabetes mellitus without complications: Secondary | ICD-10-CM | POA: Diagnosis not present

## 2022-01-02 DIAGNOSIS — Z961 Presence of intraocular lens: Secondary | ICD-10-CM | POA: Diagnosis not present

## 2022-01-02 DIAGNOSIS — H52203 Unspecified astigmatism, bilateral: Secondary | ICD-10-CM | POA: Diagnosis not present

## 2022-01-02 LAB — HM DIABETES EYE EXAM

## 2022-01-06 ENCOUNTER — Other Ambulatory Visit: Payer: Self-pay | Admitting: Family Medicine

## 2022-01-08 ENCOUNTER — Ambulatory Visit (INDEPENDENT_AMBULATORY_CARE_PROVIDER_SITE_OTHER): Payer: Medicare HMO | Admitting: Family Medicine

## 2022-01-08 ENCOUNTER — Encounter: Payer: Self-pay | Admitting: Family Medicine

## 2022-01-08 VITALS — BP 120/82 | HR 67 | Temp 98.3°F | Ht 61.0 in | Wt 225.0 lb

## 2022-01-08 DIAGNOSIS — E538 Deficiency of other specified B group vitamins: Secondary | ICD-10-CM | POA: Diagnosis not present

## 2022-01-08 DIAGNOSIS — M810 Age-related osteoporosis without current pathological fracture: Secondary | ICD-10-CM

## 2022-01-08 DIAGNOSIS — E039 Hypothyroidism, unspecified: Secondary | ICD-10-CM

## 2022-01-08 DIAGNOSIS — E119 Type 2 diabetes mellitus without complications: Secondary | ICD-10-CM

## 2022-01-08 DIAGNOSIS — Z0000 Encounter for general adult medical examination without abnormal findings: Secondary | ICD-10-CM | POA: Diagnosis not present

## 2022-01-08 MED ORDER — HYDROCODONE-ACETAMINOPHEN 5-325 MG PO TABS: 1.0000 | ORAL_TABLET | Freq: Four times a day (QID) | ORAL | 0 refills | Status: DC | PRN

## 2022-01-08 NOTE — Patient Instructions (Addendum)
You are eligible to schedule your annual wellness visit with our nurse specialist Otila Kluver.  Please consider scheduling this before you leave today   Please stop by lab before you go If you have mychart- we will send your results within 3 business days of [place] receiving them.  If you do not have mychart- we will call you about results within 5 business days of [place] receiving them.  *please also note that you will see labs on mychart as soon as they post. I will later go in and write notes on them- will say "notes from Dr. Yong Channel"   Recommended follow up: Return in about 6 months (around 07/10/2022) for followup or sooner if needed.Schedule b4 you leave.
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
## 2022-01-09 LAB — COMPREHENSIVE METABOLIC PANEL
ALT: 15 U/L (ref 0–35)
AST: 21 U/L (ref 0–37)
Albumin: 4.2 g/dL (ref 3.5–5.2)
Alkaline Phosphatase: 92 U/L (ref 39–117)
BUN: 12 mg/dL (ref 6–23)
CO2: 30 mEq/L (ref 19–32)
Calcium: 9.6 mg/dL (ref 8.4–10.5)
Chloride: 100 mEq/L (ref 96–112)
Creatinine, Ser: 1.06 mg/dL (ref 0.40–1.20)
GFR: 49.16 mL/min — ABNORMAL LOW (ref >60.00)
Glucose, Bld: 115 mg/dL — ABNORMAL HIGH (ref 70–99)
Potassium: 4.3 mEq/L (ref 3.5–5.1)
Sodium: 138 mEq/L (ref 135–145)
Total Bilirubin: 0.6 mg/dL (ref 0.2–1.2)
Total Protein: 6.9 g/dL (ref 6.0–8.3)

## 2022-01-09 LAB — CBC WITH DIFFERENTIAL/PLATELET
Basophils Absolute: 0.1 10*3/uL (ref 0.0–0.1)
Basophils Relative: 1 % (ref 0.0–3.0)
Eosinophils Absolute: 0.4 10*3/uL (ref 0.0–0.7)
Eosinophils Relative: 6.3 % — ABNORMAL HIGH (ref 0.0–5.0)
HCT: 41.5 % (ref 36.0–46.0)
Hemoglobin: 14.1 g/dL (ref 12.0–15.0)
Lymphocytes Relative: 29.8 % (ref 12.0–46.0)
Lymphs Abs: 2.1 10*3/uL (ref 0.7–4.0)
MCHC: 33.9 g/dL (ref 30.0–36.0)
MCV: 91.4 fl (ref 78.0–100.0)
Monocytes Absolute: 0.6 10*3/uL (ref 0.1–1.0)
Monocytes Relative: 9.1 % (ref 3.0–12.0)
Neutro Abs: 3.8 10*3/uL (ref 1.4–7.7)
Neutrophils Relative %: 53.8 % (ref 43.0–77.0)
Platelets: 244 10*3/uL (ref 150.0–400.0)
RBC: 4.54 Mil/uL (ref 3.87–5.11)
RDW: 13.4 % (ref 11.5–15.5)
WBC: 7 10*3/uL (ref 4.0–10.5)

## 2022-01-09 LAB — VITAMIN B12: Vitamin B-12: 729 pg/mL (ref 211–911)

## 2022-01-09 LAB — LIPID PANEL
Cholesterol: 155 mg/dL (ref 0–200)
HDL: 59.9 mg/dL (ref >39.00)
LDL Cholesterol: 62 mg/dL (ref 0–99)
NonHDL: 95.49
Total CHOL/HDL Ratio: 3
Triglycerides: 166 mg/dL — ABNORMAL HIGH (ref 0.0–149.0)
VLDL: 33.2 mg/dL (ref 0.0–40.0)

## 2022-01-09 LAB — HEMOGLOBIN A1C: Hgb A1c MFr Bld: 6.7 % — ABNORMAL HIGH (ref 4.6–6.5)

## 2022-01-16 ENCOUNTER — Other Ambulatory Visit (HOSPITAL_COMMUNITY): Payer: Self-pay

## 2022-01-26 DIAGNOSIS — R5383 Other fatigue: Secondary | ICD-10-CM | POA: Diagnosis not present

## 2022-01-26 DIAGNOSIS — Z20822 Contact with and (suspected) exposure to covid-19: Secondary | ICD-10-CM | POA: Diagnosis not present

## 2022-01-27 DIAGNOSIS — Z20822 Contact with and (suspected) exposure to covid-19: Secondary | ICD-10-CM | POA: Diagnosis not present

## 2022-01-27 DIAGNOSIS — R5383 Other fatigue: Secondary | ICD-10-CM | POA: Diagnosis not present

## 2022-01-30 ENCOUNTER — Other Ambulatory Visit (HOSPITAL_COMMUNITY): Payer: Self-pay

## 2022-02-12 DIAGNOSIS — R5383 Other fatigue: Secondary | ICD-10-CM | POA: Diagnosis not present

## 2022-02-12 DIAGNOSIS — Z20822 Contact with and (suspected) exposure to covid-19: Secondary | ICD-10-CM | POA: Diagnosis not present

## 2022-02-12 NOTE — Addendum Note (Signed)
Addended by: Marin Olp on: 02/12/2022 12:31 PM   Modules accepted: Orders

## 2022-02-14 DIAGNOSIS — Z20822 Contact with and (suspected) exposure to covid-19: Secondary | ICD-10-CM | POA: Diagnosis not present

## 2022-02-14 DIAGNOSIS — R5383 Other fatigue: Secondary | ICD-10-CM | POA: Diagnosis not present

## 2022-02-15 ENCOUNTER — Encounter: Payer: Self-pay | Admitting: Family Medicine

## 2022-02-15 MED ORDER — OZEMPIC (0.25 OR 0.5 MG/DOSE) 2 MG/3ML ~~LOC~~ SOPN: PEN_INJECTOR | SUBCUTANEOUS | 0 refills | Status: DC

## 2022-02-16 DIAGNOSIS — Z20822 Contact with and (suspected) exposure to covid-19: Secondary | ICD-10-CM | POA: Diagnosis not present

## 2022-02-16 DIAGNOSIS — R5383 Other fatigue: Secondary | ICD-10-CM | POA: Diagnosis not present

## 2022-02-17 DIAGNOSIS — R5383 Other fatigue: Secondary | ICD-10-CM | POA: Diagnosis not present

## 2022-02-17 DIAGNOSIS — Z20822 Contact with and (suspected) exposure to covid-19: Secondary | ICD-10-CM | POA: Diagnosis not present

## 2022-02-19 ENCOUNTER — Ambulatory Visit (INDEPENDENT_AMBULATORY_CARE_PROVIDER_SITE_OTHER)
Admission: RE | Admit: 2022-02-19 | Discharge: 2022-02-19 | Disposition: A | Discharge status: Home / Self Care | Payer: Medicare HMO | Source: Ambulatory Visit | Attending: Family Medicine | Admitting: Family Medicine

## 2022-02-19 DIAGNOSIS — M810 Age-related osteoporosis without current pathological fracture: Secondary | ICD-10-CM

## 2022-02-20 DIAGNOSIS — R5383 Other fatigue: Secondary | ICD-10-CM | POA: Diagnosis not present

## 2022-02-20 DIAGNOSIS — Z20822 Contact with and (suspected) exposure to covid-19: Secondary | ICD-10-CM | POA: Diagnosis not present

## 2022-02-27 ENCOUNTER — Other Ambulatory Visit: Payer: Self-pay

## 2022-02-27 ENCOUNTER — Inpatient Hospital Stay (HOSPITAL_COMMUNITY)
Admission: EM | Admit: 2022-02-27 | Discharge: 2022-03-02 | DRG: 083 | Disposition: A | Discharge status: Home / Self Care | Payer: Medicare HMO | Attending: Internal Medicine | Admitting: Internal Medicine

## 2022-02-27 ENCOUNTER — Inpatient Hospital Stay (HOSPITAL_COMMUNITY): Payer: Medicare HMO

## 2022-02-27 ENCOUNTER — Emergency Department (HOSPITAL_COMMUNITY): Payer: Medicare HMO

## 2022-02-27 DIAGNOSIS — R55 Syncope and collapse: Secondary | ICD-10-CM | POA: Diagnosis not present

## 2022-02-27 DIAGNOSIS — S0990XA Unspecified injury of head, initial encounter: Secondary | ICD-10-CM | POA: Diagnosis not present

## 2022-02-27 DIAGNOSIS — Z87891 Personal history of nicotine dependence: Secondary | ICD-10-CM

## 2022-02-27 DIAGNOSIS — S0003XA Contusion of scalp, initial encounter: Secondary | ICD-10-CM | POA: Diagnosis not present

## 2022-02-27 DIAGNOSIS — I619 Nontraumatic intracerebral hemorrhage, unspecified: Secondary | ICD-10-CM

## 2022-02-27 DIAGNOSIS — J45909 Unspecified asthma, uncomplicated: Secondary | ICD-10-CM | POA: Diagnosis present

## 2022-02-27 DIAGNOSIS — Z6841 Body Mass Index (BMI) 40.0 and over, adult: Secondary | ICD-10-CM | POA: Diagnosis not present

## 2022-02-27 DIAGNOSIS — Z7982 Long term (current) use of aspirin: Secondary | ICD-10-CM

## 2022-02-27 DIAGNOSIS — W1809XA Striking against other object with subsequent fall, initial encounter: Secondary | ICD-10-CM | POA: Diagnosis present

## 2022-02-27 DIAGNOSIS — Z881 Allergy status to other antibiotic agents status: Secondary | ICD-10-CM | POA: Diagnosis not present

## 2022-02-27 DIAGNOSIS — F32A Depression, unspecified: Secondary | ICD-10-CM | POA: Diagnosis not present

## 2022-02-27 DIAGNOSIS — I2510 Atherosclerotic heart disease of native coronary artery without angina pectoris: Secondary | ICD-10-CM | POA: Diagnosis present

## 2022-02-27 DIAGNOSIS — R5383 Other fatigue: Secondary | ICD-10-CM | POA: Diagnosis not present

## 2022-02-27 DIAGNOSIS — S065XAA Traumatic subdural hemorrhage with loss of consciousness status unknown, initial encounter: Secondary | ICD-10-CM | POA: Diagnosis not present

## 2022-02-27 DIAGNOSIS — Z9884 Bariatric surgery status: Secondary | ICD-10-CM | POA: Diagnosis not present

## 2022-02-27 DIAGNOSIS — Z96612 Presence of left artificial shoulder joint: Secondary | ICD-10-CM | POA: Diagnosis present

## 2022-02-27 DIAGNOSIS — Z20822 Contact with and (suspected) exposure to covid-19: Secondary | ICD-10-CM | POA: Diagnosis not present

## 2022-02-27 DIAGNOSIS — E538 Deficiency of other specified B group vitamins: Secondary | ICD-10-CM | POA: Diagnosis present

## 2022-02-27 DIAGNOSIS — F419 Anxiety disorder, unspecified: Secondary | ICD-10-CM | POA: Diagnosis present

## 2022-02-27 DIAGNOSIS — S065X0A Traumatic subdural hemorrhage without loss of consciousness, initial encounter: Secondary | ICD-10-CM | POA: Diagnosis not present

## 2022-02-27 DIAGNOSIS — E039 Hypothyroidism, unspecified: Secondary | ICD-10-CM | POA: Diagnosis not present

## 2022-02-27 DIAGNOSIS — Z885 Allergy status to narcotic agent status: Secondary | ICD-10-CM

## 2022-02-27 DIAGNOSIS — Z90710 Acquired absence of both cervix and uterus: Secondary | ICD-10-CM

## 2022-02-27 DIAGNOSIS — Z8679 Personal history of other diseases of the circulatory system: Secondary | ICD-10-CM | POA: Diagnosis present

## 2022-02-27 DIAGNOSIS — W19XXXA Unspecified fall, initial encounter: Secondary | ICD-10-CM | POA: Diagnosis not present

## 2022-02-27 DIAGNOSIS — E119 Type 2 diabetes mellitus without complications: Secondary | ICD-10-CM

## 2022-02-27 DIAGNOSIS — R609 Edema, unspecified: Secondary | ICD-10-CM | POA: Diagnosis not present

## 2022-02-27 DIAGNOSIS — Z9102 Food additives allergy status: Secondary | ICD-10-CM | POA: Diagnosis not present

## 2022-02-27 DIAGNOSIS — I160 Hypertensive urgency: Secondary | ICD-10-CM | POA: Diagnosis present

## 2022-02-27 DIAGNOSIS — I10 Essential (primary) hypertension: Secondary | ICD-10-CM | POA: Diagnosis not present

## 2022-02-27 DIAGNOSIS — Z888 Allergy status to other drugs, medicaments and biological substances status: Secondary | ICD-10-CM | POA: Diagnosis not present

## 2022-02-27 DIAGNOSIS — Z96653 Presence of artificial knee joint, bilateral: Secondary | ICD-10-CM | POA: Diagnosis present

## 2022-02-27 DIAGNOSIS — M797 Fibromyalgia: Secondary | ICD-10-CM | POA: Diagnosis present

## 2022-02-27 DIAGNOSIS — M353 Polymyalgia rheumatica: Secondary | ICD-10-CM | POA: Diagnosis present

## 2022-02-27 DIAGNOSIS — M4312 Spondylolisthesis, cervical region: Secondary | ICD-10-CM | POA: Diagnosis not present

## 2022-02-27 LAB — CBC WITH DIFFERENTIAL/PLATELET
Abs Immature Granulocytes: 0.03 10*3/uL (ref 0.00–0.07)
Basophils Absolute: 0.1 10*3/uL (ref 0.0–0.1)
Basophils Relative: 1 %
Eosinophils Absolute: 0.5 10*3/uL (ref 0.0–0.5)
Eosinophils Relative: 8 %
HCT: 41.7 % (ref 36.0–46.0)
Hemoglobin: 13.3 g/dL (ref 12.0–15.0)
Immature Granulocytes: 1 %
Lymphocytes Relative: 26 %
Lymphs Abs: 1.7 10*3/uL (ref 0.7–4.0)
MCH: 29.1 pg (ref 26.0–34.0)
MCHC: 31.9 g/dL (ref 30.0–36.0)
MCV: 91.2 fL (ref 80.0–100.0)
Monocytes Absolute: 0.6 10*3/uL (ref 0.1–1.0)
Monocytes Relative: 9 %
Neutro Abs: 3.7 10*3/uL (ref 1.7–7.7)
Neutrophils Relative %: 55 %
Platelets: 201 10*3/uL (ref 150–400)
RBC: 4.57 MIL/uL (ref 3.87–5.11)
RDW: 13.2 % (ref 11.5–15.5)
WBC: 6.6 10*3/uL (ref 4.0–10.5)
nRBC: 0 % (ref 0.0–0.2)

## 2022-02-27 LAB — CBG MONITORING, ED: Glucose-Capillary: 126 mg/dL — ABNORMAL HIGH (ref 70–99)

## 2022-02-27 LAB — COMPREHENSIVE METABOLIC PANEL
ALT: 20 U/L (ref 0–44)
AST: 26 U/L (ref 15–41)
Albumin: 3.9 g/dL (ref 3.5–5.0)
Alkaline Phosphatase: 67 U/L (ref 38–126)
Anion gap: 13 (ref 5–15)
BUN: 11 mg/dL (ref 8–23)
CO2: 27 mmol/L (ref 22–32)
Calcium: 8.9 mg/dL (ref 8.9–10.3)
Chloride: 97 mmol/L — ABNORMAL LOW (ref 98–111)
Creatinine, Ser: 0.94 mg/dL (ref 0.44–1.00)
GFR, Estimated: >60 mL/min (ref >60)
Glucose, Bld: 143 mg/dL — ABNORMAL HIGH (ref 70–99)
Potassium: 3.8 mmol/L (ref 3.5–5.1)
Sodium: 137 mmol/L (ref 135–145)
Total Bilirubin: 0.6 mg/dL (ref 0.3–1.2)
Total Protein: 7 g/dL (ref 6.5–8.1)

## 2022-02-27 LAB — LIPASE, BLOOD: Lipase: 36 U/L (ref 11–51)

## 2022-02-27 LAB — MRSA NEXT GEN BY PCR, NASAL: MRSA by PCR Next Gen: NOT DETECTED

## 2022-02-27 MED ORDER — OXYCODONE HCL 5 MG PO TABS
5.0000 mg | ORAL_TABLET | Freq: Once | ORAL | Status: DC
  Filled 2022-02-27 (×2): qty 1

## 2022-02-27 MED ORDER — POLYETHYLENE GLYCOL 3350 17 G PO PACK: 17.0000 g | PACK | Freq: Every day | ORAL | Status: DC | PRN

## 2022-02-27 MED ORDER — OXYCODONE-ACETAMINOPHEN 5-325 MG PO TABS
2.0000 | ORAL_TABLET | Freq: Four times a day (QID) | ORAL | Status: DC | PRN
  Administered 2022-02-28 – 2022-03-02 (×7): 2 via ORAL
  Filled 2022-02-27 (×8): qty 2

## 2022-02-27 MED ORDER — ACETAMINOPHEN 500 MG PO TABS
1000.0000 mg | ORAL_TABLET | Freq: Once | ORAL | Status: AC
  Administered 2022-02-27: 1000 mg via ORAL
  Filled 2022-02-27: qty 2

## 2022-02-27 MED ORDER — DOCUSATE SODIUM 100 MG PO CAPS: 100.0000 mg | ORAL_CAPSULE | Freq: Two times a day (BID) | ORAL | Status: DC | PRN

## 2022-02-27 MED ORDER — MORPHINE SULFATE (PF) 4 MG/ML IV SOLN
4.0000 mg | INTRAVENOUS | Status: DC | PRN
  Administered 2022-02-27 – 2022-02-28 (×2): 4 mg via INTRAVENOUS
  Filled 2022-02-27 (×3): qty 1

## 2022-02-27 MED ORDER — CHLORHEXIDINE GLUCONATE CLOTH 2 % EX PADS
6.0000 | MEDICATED_PAD | Freq: Every day | CUTANEOUS | Status: DC
  Administered 2022-02-27 – 2022-03-01 (×3): 6 via TOPICAL

## 2022-02-27 MED ORDER — DIPHENHYDRAMINE HCL 50 MG/ML IJ SOLN
12.5000 mg | Freq: Once | INTRAMUSCULAR | Status: AC
  Administered 2022-02-27: 12.5 mg via INTRAVENOUS
  Filled 2022-02-27: qty 1

## 2022-02-27 MED ORDER — CLEVIDIPINE BUTYRATE 0.5 MG/ML IV EMUL
0.0000 mg/h | INTRAVENOUS | Status: DC
  Administered 2022-02-27: 6 mg/h via INTRAVENOUS
  Administered 2022-02-27 (×2): 10 mg/h via INTRAVENOUS
  Administered 2022-02-27: 2 mg/h via INTRAVENOUS
  Filled 2022-02-27 (×7): qty 50

## 2022-02-27 MED ORDER — METOCLOPRAMIDE HCL 5 MG/ML IJ SOLN
5.0000 mg | Freq: Once | INTRAMUSCULAR | Status: AC
  Administered 2022-02-27: 5 mg via INTRAVENOUS
  Filled 2022-02-27: qty 2

## 2022-02-27 MED ORDER — ORAL CARE MOUTH RINSE: 15.0000 mL | OROMUCOSAL | Status: DC | PRN

## 2022-02-27 MED ORDER — SODIUM CHLORIDE 0.9 % IV BOLUS
1000.0000 mL | Freq: Once | INTRAVENOUS | Status: AC
  Administered 2022-02-27: 1000 mL via INTRAVENOUS

## 2022-02-27 MED ORDER — MORPHINE SULFATE (PF) 4 MG/ML IV SOLN
4.0000 mg | Freq: Once | INTRAVENOUS | Status: AC
  Administered 2022-02-27: 4 mg via INTRAVENOUS
  Filled 2022-02-27: qty 1

## 2022-02-27 NOTE — ED Notes (Signed)
RN notified that pt would like stronger pain medication.

## 2022-02-27 NOTE — Progress Notes (Signed)
Repeat CT noted  Stable findings, no increasing SDH, no shift

## 2022-02-27 NOTE — H&P (Signed)
NAME:  [name], MRN:  [id number], DOB:  Apr 30, 1940, LOS: 0 ADMISSION DATE:  02/27/2022, CONSULTATION DATE:  02/27/2022 REFERRING MD:  Dr Tyrone Nine, CHIEF COMPLAINT:  SDH   History of Present Illness:  Patient with subdural hematoma following mechanical fall  Blood pressure uncontrolled, currently on Cleviprex Complain of a headache  Fall appears to have been completely accidental  Pertinent  Medical History   Past Medical History:  Diagnosis Date   Anemia    Anxiety    Arthritis    osteoarthritis - shoulder, knees & hips   Asthma    Coronary artery disease    Depression    Diabetes mellitus without complication (HCC)    no meds   Fibromyalgia    Heart murmur    Hyperparathyroidism ([place])    Hypertension    Dr. Orinda Kenner manages BP, pt. reports MD has not found a need for treatment    Hypothyroidism    Polymyalgia rheumatica ([place])    Sleep difficulties    had sleep study -2009, prior to gastric surgery, told that there was not a need for f/u   Thyroid disease    Vitamin B 12 deficiency    Significant Hospital Events: Including procedures, antibiotic start and stop dates in addition to other pertinent events   CT head with left subdural hematoma, interhemispheric hematoma, right parietal scalp hematoma  Interim History / Subjective:  Awake alert interactive, complain of pain, headache  Objective   Blood pressure (!) 172/78, pulse 70, temperature 98.3 F (36.8 C), temperature source Oral, resp. rate 18, height '5\' 1"'$  (1.549 m), weight 102 kg, SpO2 97 %.       No intake or output data in the 24 hours ending 02/27/22 1332 Filed Weights   02/27/22 1021  Weight: 102 kg    Examination: General: Elderly, does not appear to be in distress HENT: Moist oral mucosa Lungs: Clear breath sounds Cardiovascular: S1-S2 appreciated Abdomen: Soft, bowel sounds appreciated Extremities: No clubbing, no edema Neuro: Alert and oriented x 3, nonfocal exam GU:   CT head  reviewed  Resolved Hospital Problem list     Assessment & Plan:  Subdural hematoma secondary to mechanical fall Uncontrolled hypertension -Blood pressure control -Maintain systolic about 932 -Repeat CT scan of the chest in 6 hours -Every 2 neurochecks  -No significant clot -No significant midline shift -No hydrocephalus  Appreciate neurosurgery input -No neurosurgical intervention required -No acute fractures on CT spine  Currently on Cleviprex, continue to titrate for blood pressure control  Does have history of coronary artery disease  History of depression -On Pristiq  History of asthma -Albuterol as needed  History of hypothyroidism -Compliant with Synthroid   Best Practice (right click and "Reselect all SmartList Selections" daily)   Diet/type: Regular consistency (see orders) DVT prophylaxis: SCD GI prophylaxis: N/A Lines: N/A Foley:  N/A Code Status:  full code Last date of multidisciplinary goals of care discussion [pending]  Labs   CBC: Recent Labs  Lab 02/27/22 1023  WBC 6.6  NEUTROABS 3.7  HGB 13.3  HCT 41.7  MCV 91.2  PLT 355    Basic Metabolic Panel: Recent Labs  Lab 02/27/22 1023  NA 137  K 3.8  CL 97*  CO2 27  GLUCOSE 143*  BUN 11  CREATININE 0.94  CALCIUM 8.9   GFR: Estimated Creatinine Clearance: 51.5 mL/min (by C-G formula based on SCr of 0.94 mg/dL). Recent Labs  Lab 02/27/22 1023  WBC 6.6  Liver Function Tests: Recent Labs  Lab 02/27/22 1023  AST 26  ALT 20  ALKPHOS 67  BILITOT 0.6  PROT 7.0  ALBUMIN 3.9   Recent Labs  Lab 02/27/22 1023  LIPASE 36   No results for input(s): "AMMONIA" in the last 168 hours.  ABG No results found for: "PHART", "PCO2ART", "PO2ART", "HCO3", "TCO2", "ACIDBASEDEF", "O2SAT"   Coagulation Profile: No results for input(s): "INR", "PROTIME" in the last 168 hours.  Cardiac Enzymes: No results for input(s): "CKTOTAL", "CKMB", "CKMBINDEX", "TROPONINI" in the last 168  hours.  HbA1C: Hgb A1c MFr Bld  Date/Time Value Ref Range Status  01/08/2022 03:09 PM 6.7 (H) 4.6 - 6.5 % Final    Comment:    Glycemic Control Guidelines for People with Diabetes:Non Diabetic:  <6%Goal of Therapy: <7%Additional Action Suggested:  >8%   06/11/2021 10:57 AM 6.4 4.6 - 6.5 % Final    Comment:    Glycemic Control Guidelines for People with Diabetes:Non Diabetic:  <6%Goal of Therapy: <7%Additional Action Suggested:  >8%     CBG: Recent Labs  Lab 02/27/22 1028  GLUCAP 126*    Review of Systems:   Awake and alert, denies any other ongoing symptoms  Past Medical History:  She,  has a past medical history of Anemia, Anxiety, Arthritis, Asthma, Coronary artery disease, Depression, Diabetes mellitus without complication ([place]), Fibromyalgia, Heart murmur, Hyperparathyroidism ([place]), Hypertension, Hypothyroidism, Polymyalgia rheumatica ([place]), Sleep difficulties, Thyroid disease, and Vitamin B 12 deficiency.   Surgical History:   Past Surgical History:  Procedure Laterality Date   ABDOMINAL HYSTERECTOMY     BARIATRIC SURGERY     CARDIAC CATHETERIZATION     [facility]- 30 yrs. ago   CATARACT EXTRACTION, BILATERAL     late 2018   LEFT HEART CATH AND CORONARY ANGIOGRAPHY N/A 04/02/2018   Procedure: LEFT HEART CATH AND CORONARY ANGIOGRAPHY;  Surgeon: Martinique, Peter M, MD;  Location: [facility];  Service: Cardiovascular;  Laterality: N/A;   OOPHORECTOMY     pantallor arthrodesis with rod placement left foot     PARATHYROIDECTOMY Left 03/16/2021   Procedure: LEFT INFERIOR PARATHYROIDECTOMY;  Surgeon: Armandina Gemma, MD;  Location: [facility];  Service: General;  Laterality: Left;   TONSILLECTOMY     TOTAL KNEE ARTHROPLASTY Left 11/30/2018   Procedure: TOTAL KNEE ARTHROPLASTY;  Surgeon: Gaynelle Arabian, MD;  Location: [facility];  Service: Orthopedics;  Laterality: Left;   TOTAL KNEE ARTHROPLASTY Right 06/28/2019   Procedure: TOTAL KNEE ARTHROPLASTY;  Surgeon: Gaynelle Arabian, MD;  Location:  [facility];  Service: Orthopedics;  Laterality: Right;  60mn   TOTAL SHOULDER ARTHROPLASTY Left 03/12/2012   Procedure: TOTAL SHOULDER ARTHROPLASTY;  Surgeon: KMarin Shutter MD;  Location: [facility]  Service: Orthopedics;  Laterality: Left;     Social History:   reports that she quit smoking about 44 years ago. Her smoking use included cigarettes. She has a 14.00 pack-year smoking history. She has never used smokeless tobacco. She reports current alcohol use of about 2.0 standard drinks of alcohol per week. She reports that she does not use drugs.   Family History:  Her family history includes Cancer in her father; Liver cancer in her father; Stroke in her mother. There is no history of Breast cancer.   Allergies Allergies  Allergen Reactions   Flexeril [Cyclobenzaprine] Other (See Comments)    On Pristiq- possible serotonin reaction to flexeril can not take together   Ace Inhibitors Other (See Comments) and Swelling    States after  a surgery she was told to "not take" this class of drugs- not 100% sure what the reason was Possible angioedema during/after general anesthesia States after a surgery she was told to "not take" this class of drugs- not 100% sure what the reason was States after a surgery she was told to "not take" this class of drugs- not 100% sure what the reason was    Codeine Nausea Only and Other (See Comments)    Upset stomach   Kiwi Extract Other (See Comments)   Metoprolol     Edema, fatigue, headache, low HR into 40s   Azithromycin Rash     Home Medications  Prior to Admission medications   Medication Sig Start Date End Date Taking? Authorizing Provider  acetaminophen (TYLENOL) 500 MG tablet Take 1,000 mg by mouth at bedtime as needed for mild pain. Can take up to three times day    [provider]  albuterol (VENTOLIN HFA) 108 (90 Base) MCG/ACT inhaler Inhale 1-2 puffs into the lungs every 4 (four) hours as needed for wheezing or shortness of breath. 07/19/21    Marin Olp, MD  amLODipine (NORVASC) 10 MG tablet TAKE 1 TABLET EVERY DAY 09/12/21   Marin Olp, MD  aspirin EC 81 MG tablet Take 81 mg by mouth daily. Swallow whole.    [provider]  Biotin 5000 MCG CAPS Take 5,000 mcg by mouth daily at 2 am. 01/21/18   [provider]  Cholecalciferol (VITAMIN D-3) 125 MCG (5000 UT) TABS Take 5,000 Units by mouth daily.    [provider]  cyanocobalamin (,VITAMIN B-12,) 1000 MCG/ML injection INJECT 1ML (1,000 MCG TOTAL)  INTO THE MUSCLE EVERY 30 DAYS USE ON A MONTHLY BASES 07/13/21   Marin Olp, MD  desvenlafaxine (PRISTIQ) 100 MG 24 hr tablet TAKE 1 TABLET EVERY DAY 01/07/22   Marin Olp, MD  diclofenac (VOLTAREN) 75 MG EC tablet Take 1 tablet (75 mg total) by mouth 2 (two) times daily. 04/30/21   Marin Olp, MD  Evolocumab (REPATHA SURECLICK) 382 MG/ML SOAJ INJECT 1 PEN SUBCUTANEOUSLY EVERY TWO WEEKS 10/08/21   Belva Crome, MD  FeFum-FePo-FA-B Cmp-C-Zn-Mn-Cu (SE-TAN PLUS) [phone] MG CAPS TAKE 1 CAPSULE BY MOUTH TWICE A DAY 10/18/21   Marin Olp, MD  fexofenadine (ALLEGRA) 180 MG tablet Take 180 mg by mouth daily as needed for allergies or rhinitis.    [provider]  folic acid (FOLVITE) 1 MG tablet Take 1 mg by mouth daily. 10/16/20   [provider]  glucose blood test strip Test blood sugar once per day 12/14/19   Marin Olp, MD  HYDROcodone-acetaminophen (NORCO/VICODIN) 5-325 MG tablet Take 1-2 tablets by mouth every 6 (six) hours as needed for moderate pain. 01/08/22   Marin Olp, MD  Levothyroxine Sodium 150 MCG CAPS Take 150-300 mcg by mouth See admin instructions. Take 150 mcg every day EXCEPT: Monday take 300 mcg 09/18/17   [provider]  loperamide (IMODIUM) 2 MG capsule Take 4 mg by mouth daily as needed for diarrhea or loose stools.     [provider]  nitroGLYCERIN (NITROSTAT) 0.4 MG SL tablet Place 1 tablet (0.4 mg total)  under the tongue every 5 (five) minutes as needed for chest pain. 07/19/21   Marin Olp, MD  Omega-3 Fatty Acids (FISH OIL) 1000 MG CAPS Take 1,000 mg by mouth daily.    [provider]  Propylene Glycol (SYSTANE COMPLETE OP) Place 1  drop into both eyes in the morning.    [provider]  Semaglutide,0.25 or 0.'5MG'$ /DOS, (OZEMPIC, 0.25 OR 0.5 MG/DOSE,) 2 MG/3ML SOPN Inject 0.25 mg into the skin once a week for 28 days, THEN 0.5 mg once a week for 28 days. 02/15/22 04/11/22  Marin Olp, MD  TRUEplus Lancets 30G MISC Check blood sugar once per day 12/14/19   Marin Olp, MD    The patient is critically ill with multiple organ systems failure and requires high complexity decision making for assessment and support, frequent evaluation and titration of therapies, application of advanced monitoring technologies and extensive interpretation of multiple databases. Critical Care Time devoted to patient care services described in this note independent of APP/resident time (if applicable)  is 30 minutes.   Sherrilyn Rist MD [facility] Personal pager: See Amion If unanswered, please page CCM On-call: [phone]

## 2022-02-27 NOTE — ED Triage Notes (Addendum)
[place] [facility] after mechanical [place]. Pt fell back and hit head on concrete per bystanders. LOC denies blood thinners. C/o head pain

## 2022-02-27 NOTE — ED Notes (Signed)
ED TO INPATIENT HANDOFF REPORT  ED Nurse Name and Phone #: Danise Edge Name/Age/Gender [name] 82 y.o. female Room/Bed: WA13/WA13  Code Status   Code Status: Full Code  Home/SNF/Other Home Patient oriented to: self, place, time, and situation Is this baseline? Yes   Triage Complete: Triage complete  Chief Complaint Subdural hematoma ([place]) [S06.5XAA]  Triage Note Pt BIB EMS from church after mechanical fall. Pt fell back and hit head on concrete per bystanders. LOC denies blood thinners. C/o head pain   Allergies Allergies  Allergen Reactions   Flexeril [Cyclobenzaprine] Other (See Comments)    On Pristiq- possible serotonin reaction to flexeril can not take together   Ace Inhibitors Other (See Comments) and Swelling    States after a surgery she was told to "not take" this class of drugs- not 100% sure what the reason was Possible angioedema during/after general anesthesia States after a surgery she was told to "not take" this class of drugs- not 100% sure what the reason was States after a surgery she was told to "not take" this class of drugs- not 100% sure what the reason was    Codeine Nausea Only and Other (See Comments)    Upset stomach   Kiwi Extract Other (See Comments)   Metoprolol     Edema, fatigue, headache, low HR into 40s   Azithromycin Rash    Level of Care/Admitting Diagnosis ED Disposition     ED Disposition  Admit   Condition  --   Tiro: [place] [[id number]]  Level of Care: ICU [6]  May admit patient to [facility] or [facility] if equivalent level of care is available:: Yes  Covid Evaluation: Asymptomatic - no recent exposure (last 10 days) testing not required  Diagnosis: Subdural hematoma [facility]) [[id number]]  Admitting Physician: Laurin Coder [[id number]]  Attending Physician: Laurin Coder [[id number]]  Certification:: I certify this patient will need inpatient services for at least 2  midnights  Estimated Length of Stay: 2          B Medical/Surgery History Past Medical History:  Diagnosis Date   Anemia    Anxiety    Arthritis    osteoarthritis - shoulder, knees & hips   Asthma    Coronary artery disease    Depression    Diabetes mellitus without complication (HCC)    no meds   Fibromyalgia    Heart murmur    Hyperparathyroidism ([place])    Hypertension    Dr. Orinda Kenner manages BP, pt. reports MD has not found a need for treatment    Hypothyroidism    Polymyalgia rheumatica ([place])    Sleep difficulties    had sleep study -2009, prior to gastric surgery, told that there was not a need for f/u   Thyroid disease    Vitamin B 12 deficiency    Past Surgical History:  Procedure Laterality Date   ABDOMINAL HYSTERECTOMY     BARIATRIC [place]     [facility]- 30 yrs. ago   CATARACT EXTRACTION, BILATERAL     late 2018   LEFT HEART CATH AND CORONARY ANGIOGRAPHY N/A 04/02/2018   Procedure: LEFT HEART CATH AND CORONARY ANGIOGRAPHY;  Surgeon: Martinique, Peter M, MD;  Location: [facility];  Service: Cardiovascular;  Laterality: N/A;   OOPHORECTOMY     pantallor arthrodesis with rod placement left foot     PARATHYROIDECTOMY Left 03/16/2021  Procedure: LEFT INFERIOR PARATHYROIDECTOMY;  Surgeon: Armandina Gemma, MD;  Location: [facility];  Service: General;  Laterality: Left;   TONSILLECTOMY     TOTAL KNEE ARTHROPLASTY Left 11/30/2018   Procedure: TOTAL KNEE ARTHROPLASTY;  Surgeon: Gaynelle Arabian, MD;  Location: [facility];  Service: Orthopedics;  Laterality: Left;   TOTAL KNEE ARTHROPLASTY Right 06/28/2019   Procedure: TOTAL KNEE ARTHROPLASTY;  Surgeon: Gaynelle Arabian, MD;  Location: [facility];  Service: Orthopedics;  Laterality: Right;  80mn   TOTAL SHOULDER ARTHROPLASTY Left 03/12/2012   Procedure: TOTAL SHOULDER ARTHROPLASTY;  Surgeon: KMarin Shutter MD;  Location: [facility]  Service: Orthopedics;  Laterality: Left;     A IV  Location/Drains/Wounds Patient Lines/Drains/Airways Status     Active Line/Drains/Airways     Name Placement date Placement time Site Days   Peripheral IV 02/27/22 20 G Right Antecubital 02/27/22  1030  Antecubital  less than 1            Intake/Output Last 24 hours No intake or output data in the 24 hours ending 02/27/22 1640  Labs/Imaging Results for orders placed or performed during the hospital encounter of 02/27/22 (from the past 48 hour(s))  CBC with Differential     Status: None   Collection Time: 02/27/22 10:23 AM  Result Value Ref Range   WBC 6.6 4.0 - 10.5 K/uL   RBC 4.57 3.87 - 5.11 MIL/uL   Hemoglobin 13.3 12.0 - 15.0 g/dL   HCT 41.7 36.0 - 46.0 %   MCV 91.2 80.0 - 100.0 fL   MCH 29.1 26.0 - 34.0 pg   MCHC 31.9 30.0 - 36.0 g/dL   RDW 13.2 11.5 - 15.5 %   Platelets 201 150 - 400 K/uL   nRBC 0.0 0.0 - 0.2 %   Neutrophils Relative % 55 %   Neutro Abs 3.7 1.7 - 7.7 K/uL   Lymphocytes Relative 26 %   Lymphs Abs 1.7 0.7 - 4.0 K/uL   Monocytes Relative 9 %   Monocytes Absolute 0.6 0.1 - 1.0 K/uL   Eosinophils Relative 8 %   Eosinophils Absolute 0.5 0.0 - 0.5 K/uL   Basophils Relative 1 %   Basophils Absolute 0.1 0.0 - 0.1 K/uL   Immature Granulocytes 1 %   Abs Immature Granulocytes 0.03 0.00 - 0.07 K/uL    Comment: [facility] [address] [place] [address] Comprehensive metabolic panel     Status: Abnormal   Collection Time: 02/27/22 10:23 AM  Result Value Ref Range   Sodium 137 135 - 145 mmol/L   Potassium 3.8 3.5 - 5.1 mmol/L   Chloride 97 (L) 98 - 111 mmol/L   CO2 27 22 - 32 mmol/L   Glucose, Bld 143 (H) 70 - 99 mg/dL    Comment: Glucose reference range applies only to samples taken after fasting for at least 8 hours.   BUN 11 8 - 23 mg/dL   Creatinine, Ser 0.94 0.44 - 1.00 mg/dL   Calcium 8.9 8.9 - 10.3 mg/dL   Total Protein 7.0 6.5 - 8.1 g/dL   Albumin 3.9 3.5 - 5.0 g/dL   AST 26 15 - 41 U/L   ALT 20  0 - 44 U/L   Alkaline Phosphatase 67 38 - 126 U/L   Total Bilirubin 0.6 0.3 - 1.2 mg/dL   GFR, Estimated >60 >60 mL/min    Comment: (NOTE) Calculated using the CKD-EPI Creatinine Equation (2021)    Anion gap 13  5 - 15    Comment: Performed at [facility], [place] [address] [place], [place] [address]  Lipase, blood     Status: None   Collection Time: 02/27/22 10:23 AM  Result Value Ref Range   Lipase 36 11 - 51 U/L    Comment: Performed at [facility], [place] [address] [place], [place] [address]  CBG monitoring, ED     Status: Abnormal   Collection Time: 02/27/22 10:28 AM  Result Value Ref Range   Glucose-Capillary 126 (H) 70 - 99 mg/dL    Comment: Glucose reference range applies only to samples taken after fasting for at least 8 hours.   CT Head Wo Contrast  Result Date: 02/27/2022 CLINICAL DATA:  Head/neck trauma. Mechanical fall after church. Head strike on concrete. EXAM: CT HEAD WITHOUT CONTRAST CT CERVICAL SPINE WITHOUT CONTRAST TECHNIQUE: Multidetector CT imaging of the head and cervical spine was performed following the standard protocol without intravenous contrast. Multiplanar CT image reconstructions of the cervical spine were also generated. RADIATION DOSE REDUCTION: This exam was performed according to the departmental dose-optimization program which includes automated exposure control, adjustment of the mA and/or kV according to patient size and/or use of iterative reconstruction technique. COMPARISON:  None Available. FINDINGS: CT HEAD FINDINGS Brain: Acute, thin interhemispheric subdural hematoma, measuring up to 3 mm in thickness along the left anterior aspect of the falx (image 9 series 3). Acute left convexity subdural hematoma, measuring up to 6 mm along the inferior left frontal lobe (coronal image 24 series 5). No midline shift. Severe, confluence hypoattenuation of the cerebral white matter, most consistent with chronic small-vessel  disease. Cortical gray-white differentiation is preserved. No hydrocephalus. Basilar cisterns are patent. Vascular: No hyperdense vessel or unexpected calcification. Skull: No calvarial fracture or suspicious bone lesion. Skull base is unremarkable. Sinuses/Orbits: Paranasal sinuses, mastoid air cells, and middle ear cavities are well aerated. Orbits are unremarkable. Other: Large right parietal scalp hematoma. CT CERVICAL SPINE FINDINGS Alignment: Degenerative reversal of the normal cervical lordosis. 3 mm degenerative anterolisthesis of C4 on C5 with associated facet arthropathy. Skull base and vertebrae: No acute fracture. Normal craniocervical junction. Diffusely demineralized appearance of the bones. Soft tissues and spinal canal: No prevertebral fluid or swelling. No visible canal hematoma. Disc levels: Multilevel spondylosis with at least mild spinal canal stenosis at C4-5 and C5-6. Upper chest: Atherosclerotic calcifications of the aortic arch and arch vessel origins. Other: None. IMPRESSION: CT HEAD: 1. Acute left convexity subdural hematoma, measuring up to 6 mm in thickness. No midline shift. 2. Acute, thin interhemispheric subdural hematoma, measuring up to 3 mm in thickness. 3. Large right parietal scalp hematoma. No underlying calvarial fracture. CT CERVICAL SPINE: 1. No acute fracture or traumatic listhesis. 2. Multilevel spondylosis with at least mild spinal canal stenosis at C4-5 and C5-6. Aortic Atherosclerosis (ICD10-I70.0). Critical Value/emergent results were called by telephone at the time of interpretation on 02/27/2022 at 12:06 pm to provider DAN FLOYD , who verbally acknowledged these results. Electronically Signed   By: Emmit Alexanders M.D.   On: 02/27/2022 12:09   CT Cervical Spine Wo Contrast  Result Date: 02/27/2022 CLINICAL DATA:  Head/neck trauma. Mechanical fall after church. Head strike on concrete. EXAM: CT HEAD WITHOUT CONTRAST CT CERVICAL SPINE WITHOUT CONTRAST TECHNIQUE:  Multidetector CT imaging of the head and cervical spine was performed following the standard protocol without intravenous contrast. Multiplanar CT image reconstructions of the cervical spine were also generated. RADIATION DOSE REDUCTION: This exam  was performed according to the departmental dose-optimization program which includes automated exposure control, adjustment of the mA and/or kV according to patient size and/or use of iterative reconstruction technique. COMPARISON:  None Available. FINDINGS: CT HEAD FINDINGS Brain: Acute, thin interhemispheric subdural hematoma, measuring up to 3 mm in thickness along the left anterior aspect of the falx (image 9 series 3). Acute left convexity subdural hematoma, measuring up to 6 mm along the inferior left frontal lobe (coronal image 24 series 5). No midline shift. Severe, confluence hypoattenuation of the cerebral white matter, most consistent with chronic small-vessel disease. Cortical gray-white differentiation is preserved. No hydrocephalus. Basilar cisterns are patent. Vascular: No hyperdense vessel or unexpected calcification. Skull: No calvarial fracture or suspicious bone lesion. Skull base is unremarkable. Sinuses/Orbits: Paranasal sinuses, mastoid air cells, and middle ear cavities are well aerated. Orbits are unremarkable. Other: Large right parietal scalp hematoma. CT CERVICAL SPINE FINDINGS Alignment: Degenerative reversal of the normal cervical lordosis. 3 mm degenerative anterolisthesis of C4 on C5 with associated facet arthropathy. Skull base and vertebrae: No acute fracture. Normal craniocervical junction. Diffusely demineralized appearance of the bones. Soft tissues and spinal canal: No prevertebral fluid or swelling. No visible canal hematoma. Disc levels: Multilevel spondylosis with at least mild spinal canal stenosis at C4-5 and C5-6. Upper chest: Atherosclerotic calcifications of the aortic arch and arch vessel origins. Other: None. IMPRESSION: CT  HEAD: 1. Acute left convexity subdural hematoma, measuring up to 6 mm in thickness. No midline shift. 2. Acute, thin interhemispheric subdural hematoma, measuring up to 3 mm in thickness. 3. Large right parietal scalp hematoma. No underlying calvarial fracture. CT CERVICAL SPINE: 1. No acute fracture or traumatic listhesis. 2. Multilevel spondylosis with at least mild spinal canal stenosis at C4-5 and C5-6. Aortic Atherosclerosis (ICD10-I70.0). Critical Value/emergent results were called by telephone at the time of interpretation on 02/27/2022 at 12:06 pm to provider DAN FLOYD , who verbally acknowledged these results. Electronically Signed   By: Emmit Alexanders M.D.   On: 02/27/2022 12:09    Pending Labs Unresulted Labs (From admission, onward)     Start     Ordered   02/28/22 0500  CBC  Tomorrow morning,   R        02/27/22 1356   02/28/22 5366  Basic metabolic panel  Tomorrow morning,   R        02/27/22 1356            Vitals/Pain Today's Vitals   02/27/22 1445 02/27/22 1536 02/27/22 1600 02/27/22 1630  BP: (!) 149/80 (!) 140/90 (!) 152/83 (!) 171/90  Pulse: 74 68 72 78  Resp: '16 18 18   '$ Temp:      TempSrc:      SpO2: 95% 95% 98% 90%  Weight:      Height:      PainSc:        Isolation Precautions No active isolations  Medications Medications  oxyCODONE (Oxy IR/ROXICODONE) immediate release tablet 5 mg (5 mg Oral Patient Refused/Not Given 02/27/22 1047)  clevidipine (CLEVIPREX) infusion 0.5 mg/mL (4 mg/hr Intravenous Rate/Dose Change 02/27/22 1319)  oxyCODONE-acetaminophen (PERCOCET/ROXICET) 5-325 MG per tablet 2 tablet (has no administration in time range)  docusate sodium (COLACE) capsule 100 mg (has no administration in time range)  polyethylene glycol (MIRALAX / GLYCOLAX) packet 17 g (has no administration in time range)  sodium chloride 0.9 % bolus 1,000 mL (1,000 mLs Intravenous New Bag/Given 02/27/22 1036)  acetaminophen (TYLENOL) tablet 1,000 mg (1,000 mg Oral  Given  02/27/22 1037)  metoCLOPramide (REGLAN) injection 5 mg (5 mg Intravenous Given 02/27/22 1235)  diphenhydrAMINE (BENADRYL) injection 12.5 mg (12.5 mg Intravenous Given 02/27/22 1236)  morphine (PF) 4 MG/ML injection 4 mg (4 mg Intravenous Given 02/27/22 1237)    Mobility walks     Focused Assessments     R Recommendations: See Admitting Provider Note  Report given to:   Additional Notes:

## 2022-02-27 NOTE — ED Notes (Signed)
Pt refused to dress out in gown. ?

## 2022-02-27 NOTE — Progress Notes (Addendum)
Patient ID: [name], female   DOB: 11/28/1940, 82 y.o.   MRN: [id number] Called and asked to review CT scan of her head following a fall which shows a minimal left frontoparietal 6 mm subdural with minimal to no mass effect and even smaller interhemispheric subdural.  Only blood thinner patient reportedly on his aspirin do not see any need to change medication or no in neurosurgical intervention needed.  Recommend repeating the CT scan in 6 hours.  Continue medical workup for syncope and hypertension.  CT cervical spine does show severe spondylosis with kyphosis centered around C5-6 and C6-7.  However no acute fractures.

## 2022-02-27 NOTE — ED Provider Notes (Signed)
[place] EMERGENCY DEPARTMENT AT [facility] Provider Note   CSN: [id number] Arrival date & time: 02/27/22  1012     History {Add pertinent medical, surgical, social history, OB history to HPI:1} Chief Complaint  Patient presents with   [name] is a 82 y.o. female.  82 yo F with a chief complaint of a fall.  Patient does not remember exactly what happened.  Per bystanders she was moving boxes at a church and putting them on a truck and she had tripped over something and fell and struck the back of her head on the ground.  Patient does not remember anything like that happening.  She denies chest pain denies difficulty breathing denies headache or neck pain prior to the event.  She has some pain to the back of her head after her fall.   Fall       Home Medications Prior to Admission medications   Medication Sig Start Date End Date Taking? Authorizing Provider  acetaminophen (TYLENOL) 500 MG tablet Take 1,000 mg by mouth at bedtime as needed for mild pain. Can take up to three times day    [provider]  albuterol (VENTOLIN HFA) 108 (90 Base) MCG/ACT inhaler Inhale 1-2 puffs into the lungs every 4 (four) hours as needed for wheezing or shortness of breath. 07/19/21   Marin Olp, MD  amLODipine (NORVASC) 10 MG tablet TAKE 1 TABLET EVERY DAY 09/12/21   Marin Olp, MD  aspirin EC 81 MG tablet Take 81 mg by mouth daily. Swallow whole.    [provider]  Biotin 5000 MCG CAPS Take 5,000 mcg by mouth daily at 2 am. 01/21/18   [provider]  Cholecalciferol (VITAMIN D-3) 125 MCG (5000 UT) TABS Take 5,000 Units by mouth daily.    [provider]  cyanocobalamin (,VITAMIN B-12,) 1000 MCG/ML injection INJECT 1ML (1,000 MCG TOTAL)  INTO THE MUSCLE EVERY 30 DAYS USE ON A MONTHLY BASES 07/13/21   Marin Olp, MD  desvenlafaxine (PRISTIQ) 100 MG 24 hr tablet TAKE 1 TABLET EVERY DAY 01/07/22   Marin Olp,  MD  diclofenac (VOLTAREN) 75 MG EC tablet Take 1 tablet (75 mg total) by mouth 2 (two) times daily. 04/30/21   Marin Olp, MD  Evolocumab (REPATHA SURECLICK) 621 MG/ML SOAJ INJECT 1 PEN SUBCUTANEOUSLY EVERY TWO WEEKS 10/08/21   Belva Crome, MD  FeFum-FePo-FA-B Cmp-C-Zn-Mn-Cu (SE-TAN PLUS) [phone] MG CAPS TAKE 1 CAPSULE BY MOUTH TWICE A DAY 10/18/21   Marin Olp, MD  fexofenadine (ALLEGRA) 180 MG tablet Take 180 mg by mouth daily as needed for allergies or rhinitis.    [provider]  folic acid (FOLVITE) 1 MG tablet Take 1 mg by mouth daily. 10/16/20   [provider]  glucose blood test strip Test blood sugar once per day 12/14/19   Marin Olp, MD  HYDROcodone-acetaminophen (NORCO/VICODIN) 5-325 MG tablet Take 1-2 tablets by mouth every 6 (six) hours as needed for moderate pain. 01/08/22   Marin Olp, MD  Levothyroxine Sodium 150 MCG CAPS Take 150-300 mcg by mouth See admin instructions. Take 150 mcg every day EXCEPT: Monday take 300 mcg 09/18/17   [provider]  loperamide (IMODIUM) 2 MG capsule Take 4 mg by mouth daily as needed for diarrhea or loose stools.     [provider]  nitroGLYCERIN (NITROSTAT) 0.4 MG SL tablet Place 1 tablet (0.4 mg total) under the  tongue every 5 (five) minutes as needed for chest pain. 07/19/21   Marin Olp, MD  Omega-3 Fatty Acids (FISH OIL) 1000 MG CAPS Take 1,000 mg by mouth daily.    [provider]  Propylene Glycol (SYSTANE COMPLETE OP) Place 1 drop into both eyes in the morning.    [provider]  Semaglutide,0.25 or 0.'5MG'$ /DOS, (OZEMPIC, 0.25 OR 0.5 MG/DOSE,) 2 MG/3ML SOPN Inject 0.25 mg into the skin once a week for 28 days, THEN 0.5 mg once a week for 28 days. 02/15/22 04/11/22  Marin Olp, MD  TRUEplus Lancets 30G MISC Check blood sugar once per day 12/14/19   Marin Olp, MD      Allergies    Flexeril [cyclobenzaprine], Ace inhibitors, Codeine, Kiwi  extract, Metoprolol, and Azithromycin    Review of Systems   Review of Systems  Physical Exam Updated Vital Signs Pulse 70   Temp 98.3 F (36.8 C) (Oral)   Resp 18   Ht '5\' 1"'$  (1.549 m)   Wt 102 kg   SpO2 100%   BMI 42.49 kg/m  Physical Exam Vitals and nursing note reviewed.  Constitutional:      General: She is not in acute distress.    Appearance: She is well-developed. She is not diaphoretic.  HENT:     Head: Normocephalic.     Comments: Hematoma to the right occiput Eyes:     Pupils: Pupils are equal, round, and reactive to light.  Cardiovascular:     Rate and Rhythm: Normal rate and regular rhythm.     Heart sounds: No murmur heard.    No friction rub. No gallop.  Pulmonary:     Effort: Pulmonary effort is normal.     Breath sounds: No wheezing or rales.  Abdominal:     General: There is no distension.     Palpations: Abdomen is soft.     Tenderness: There is no abdominal tenderness.  Musculoskeletal:        General: No tenderness.     Cervical back: Normal range of motion and neck supple.     Comments: Palpated from head to toe without other noted areas of bony tenderness  Skin:    General: Skin is warm and dry.  Neurological:     Mental Status: She is alert and oriented to person, place, and time.  Psychiatric:        Behavior: Behavior normal.     ED Results / Procedures / Treatments   Labs (all labs ordered are listed, but only abnormal results are displayed) Labs Reviewed  CBC WITH DIFFERENTIAL/PLATELET  COMPREHENSIVE METABOLIC PANEL  LIPASE, BLOOD  CBG MONITORING, ED    EKG None  Radiology No results found.  Procedures Procedures  {Document cardiac monitor, telemetry assessment procedure when appropriate:1}  Medications Ordered in ED Medications  sodium chloride 0.9 % bolus 1,000 mL (has no administration in time range)  acetaminophen (TYLENOL) tablet 1,000 mg (has no administration in time range)  oxyCODONE (Oxy IR/ROXICODONE)  immediate release tablet 5 mg (has no administration in time range)    ED Course/ Medical Decision Making/ A&P   {   Click here for ABCD2, HEART and other calculatorsREFRESH Note before signing :1}                          Medical Decision Making Amount and/or Complexity of Data Reviewed Labs: ordered. Radiology: ordered. ECG/medicine tests: ordered.  Risk OTC drugs.  Prescription drug management.   82 yo F with a chief complaints of fall.  Sounds like a trip and fall based on EMS report the patient has no recollection of what happened.  Will obtain blood work CT of the head reassess.  CT of the head concerning for a subdural hematoma, no midline shift.  Patient reassessed and still very symptomatic.  Hypertensive.  Will start on clevidipine.  More aggressively treat pain.  Will discuss with neurosurgery.  She was able to stand up and ambulate to the bedside commode without issue.  Continues to have no obvious neurologic deficit.  I discussed case with Dr. Saintclair Halsted, based on the CT images did not feel the patient needed acute neurosurgical intervention, recommended repeat CT in 6 hours.  Felt reasonable for medical admission for unexplained syncope.  I did start the patient on blood pressure management, will discuss with ICU.  {Document critical care time when appropriate:1} {Document review of labs and clinical decision tools ie heart score, Chads2Vasc2 etc:1}  {Document your independent review of radiology images, and any outside records:1} {Document your discussion with family members, caretakers, and with consultants:1} {Document social determinants of health affecting pt's care:1} {Document your decision making why or why not admission, treatments were needed:1} Final Clinical Impression(s) / ED Diagnoses Final diagnoses:  None    Rx / DC Orders ED Discharge Orders     None

## 2022-02-28 DIAGNOSIS — S065XAA Traumatic subdural hemorrhage with loss of consciousness status unknown, initial encounter: Secondary | ICD-10-CM

## 2022-02-28 LAB — GLUCOSE, CAPILLARY
Glucose-Capillary: 121 mg/dL — ABNORMAL HIGH (ref 70–99)
Glucose-Capillary: 160 mg/dL — ABNORMAL HIGH (ref 70–99)
Glucose-Capillary: 89 mg/dL (ref 70–99)
Glucose-Capillary: 145 mg/dL — ABNORMAL HIGH (ref 70–99)
Glucose-Capillary: 94 mg/dL (ref 70–99)

## 2022-02-28 LAB — CBC
HCT: 37.9 % (ref 36.0–46.0)
Hemoglobin: 12 g/dL (ref 12.0–15.0)
MCH: 29.4 pg (ref 26.0–34.0)
MCHC: 31.7 g/dL (ref 30.0–36.0)
MCV: 92.9 fL (ref 80.0–100.0)
Platelets: 198 K/uL (ref 150–400)
RBC: 4.08 MIL/uL (ref 3.87–5.11)
RDW: 13.6 % (ref 11.5–15.5)
WBC: 7.3 K/uL (ref 4.0–10.5)
nRBC: 0 % (ref 0.0–0.2)

## 2022-02-28 LAB — BASIC METABOLIC PANEL WITH GFR
Anion gap: 12 (ref 5–15)
BUN: 16 mg/dL (ref 8–23)
CO2: 28 mmol/L (ref 22–32)
Calcium: 8.5 mg/dL — ABNORMAL LOW (ref 8.9–10.3)
Chloride: 98 mmol/L (ref 98–111)
Creatinine, Ser: 0.97 mg/dL (ref 0.44–1.00)
GFR, Estimated: 59 mL/min — ABNORMAL LOW
Glucose, Bld: 121 mg/dL — ABNORMAL HIGH (ref 70–99)
Potassium: 3.5 mmol/L (ref 3.5–5.1)
Sodium: 138 mmol/L (ref 135–145)

## 2022-02-28 MED ORDER — LEVETIRACETAM 500 MG PO TABS
500.0000 mg | ORAL_TABLET | Freq: Two times a day (BID) | ORAL | Status: DC
  Administered 2022-02-28 – 2022-03-02 (×5): 500 mg via ORAL
  Filled 2022-02-28 (×5): qty 1

## 2022-02-28 MED ORDER — LEVOTHYROXINE SODIUM 150 MCG PO TABS
150.0000 ug | ORAL_TABLET | ORAL | Status: DC
  Administered 2022-03-01 – 2022-03-02 (×2): 150 ug via ORAL
  Filled 2022-02-28 (×2): qty 1

## 2022-02-28 MED ORDER — HYDRALAZINE HCL 10 MG PO TABS
10.0000 mg | ORAL_TABLET | Freq: Four times a day (QID) | ORAL | Status: DC
  Administered 2022-02-28 – 2022-03-02 (×7): 10 mg via ORAL
  Filled 2022-02-28 (×7): qty 1

## 2022-02-28 MED ORDER — LEVOTHYROXINE SODIUM 75 MCG PO TABS
150.0000 ug | ORAL_TABLET | Freq: Every day | ORAL | Status: DC
  Administered 2022-02-28: 150 ug via ORAL
  Filled 2022-02-28: qty 2

## 2022-02-28 MED ORDER — LEVOTHYROXINE SODIUM 150 MCG PO TABS: 300.0000 ug | ORAL_TABLET | ORAL | Status: DC

## 2022-02-28 MED ORDER — AMLODIPINE BESYLATE 10 MG PO TABS
10.0000 mg | ORAL_TABLET | Freq: Every day | ORAL | Status: DC
  Administered 2022-02-28 – 2022-03-02 (×3): 10 mg via ORAL
  Filled 2022-02-28 (×3): qty 1

## 2022-02-28 MED ORDER — VENLAFAXINE HCL ER 150 MG PO CP24
150.0000 mg | ORAL_CAPSULE | Freq: Every day | ORAL | Status: DC
  Administered 2022-02-28 – 2022-03-02 (×3): 150 mg via ORAL
  Filled 2022-02-28 (×3): qty 1

## 2022-02-28 MED ORDER — INSULIN ASPART 100 UNIT/ML IJ SOLN
0.0000 [IU] | Freq: Three times a day (TID) | INTRAMUSCULAR | Status: DC
  Administered 2022-02-28 – 2022-03-01 (×3): 3 [IU] via SUBCUTANEOUS

## 2022-02-28 NOTE — Progress Notes (Signed)
NAME:  [name], MRN:  [id number], DOB:  1940/05/08, LOS: 1 ADMISSION DATE:  02/27/2022, CONSULTATION DATE: 02/27/2022 REFERRING MD: Dr. Tyrone Nine, CHIEF COMPLAINT: Traumatic subdural hematoma  History of Present Illness:  Patient with subdural hematoma following mechanical fall   Blood pressure uncontrolled, currently on Cleviprex Complain of a headache   Fall appears to have been completely accidental  Pertinent  Medical History   Past Medical History:  Diagnosis Date   Anemia    Anxiety    Arthritis    osteoarthritis - shoulder, knees & hips   Asthma    Coronary artery disease    Depression    Diabetes mellitus without complication (HCC)    no meds   Fibromyalgia    Heart murmur    Hyperparathyroidism ([place])    Hypertension    Dr. Orinda Kenner manages BP, pt. reports MD has not found a need for treatment    Hypothyroidism    Polymyalgia rheumatica ([place])    Sleep difficulties    had sleep study -2009, prior to gastric surgery, told that there was not a need for f/u   Thyroid disease    Vitamin B 12 deficiency    Significant Hospital Events: Including procedures, antibiotic start and stop dates in addition to other pertinent events   CT head with left subdural hematoma, interhemispheric hematoma, right.  Total scalp hematoma Repeat CT head 6 hours later showed stable findings  Interim History / Subjective:  No overnight events Slept poorly because kept getting woken up Headache is better  Objective   Blood pressure (!) 147/68, pulse 70, temperature 98.7 F (37.1 C), temperature source Oral, resp. rate (!) 23, height '5\' 1"'$  (1.549 m), weight 102.6 kg, SpO2 93 %.        Intake/Output Summary (Last 24 hours) at 02/28/2022 1009 Last data filed at 02/28/2022 0900 Gross per 24 hour  Intake 152.29 ml  Output --  Net 152.29 ml   Filed Weights   02/27/22 1021 02/28/22 0500  Weight: 102 kg 102.6 kg    Examination: General: Elderly, does not appear to be in  distress HENT: Moist oral mucosa Lungs: Clear breath sounds Cardiovascular: S1-S2 appreciated Abdomen: Bowel sounds appreciated Extremities: No clubbing, no edema Neuro: Alert and oriented x 3 nonfocal exam GU:   Resolved Hospital Problem list     Assessment & Plan:  Subdural hematoma secondary to mechanical fall Uncontrolled hypertension -Blood pressure control -On Cleviprex, will start weaning as tolerated -Initiate home Norvasc 10 mg -Continue neurochecks  Keppra initiated for 7 days  Headache -Morphine helped  CT head -No significant changes on the repeat CT  No neurosurgical intervention needed as she has remained stable  History of coronary artery disease  History of depression on Pristiq -Replaced with venlafaxine while in hospital  History of asthma -Albuterol as needed  History of hypothyroidism -Continue Synthroid  If able to get off Cleviprex, will transfer to [place] (right click and "Reselect all SmartList Selections" daily)   Diet/type: Regular consistency (see orders) DVT prophylaxis: SCD GI prophylaxis: N/A Lines: N/A Foley:  N/A Code Status:  full code Last date of multidisciplinary goals of care discussion [pending]  Labs   CBC: Recent Labs  Lab 02/27/22 1023 02/28/22 0245  WBC 6.6 7.3  NEUTROABS 3.7  --   HGB 13.3 12.0  HCT 41.7 37.9  MCV 91.2 92.9  PLT 201 378    Basic Metabolic Panel: Recent Labs  Lab 02/27/22 1023 02/28/22  0245  NA 137 138  K 3.8 3.5  CL 97* 98  CO2 27 28  GLUCOSE 143* 121*  BUN 11 16  CREATININE 0.94 0.97  CALCIUM 8.9 8.5*   GFR: Estimated Creatinine Clearance: 50 mL/min (by C-G formula based on SCr of 0.97 mg/dL). Recent Labs  Lab 02/27/22 1023 02/28/22 0245  WBC 6.6 7.3    Liver Function Tests: Recent Labs  Lab 02/27/22 1023  AST 26  ALT 20  ALKPHOS 67  BILITOT 0.6  PROT 7.0  ALBUMIN 3.9   Recent Labs  Lab 02/27/22 1023  LIPASE 36   No results for  input(s): "AMMONIA" in the last 168 hours.  ABG No results found for: "PHART", "PCO2ART", "PO2ART", "HCO3", "TCO2", "ACIDBASEDEF", "O2SAT"   Coagulation Profile: No results for input(s): "INR", "PROTIME" in the last 168 hours.  Cardiac Enzymes: No results for input(s): "CKTOTAL", "CKMB", "CKMBINDEX", "TROPONINI" in the last 168 hours.  HbA1C: Hgb A1c MFr Bld  Date/Time Value Ref Range Status  01/08/2022 03:09 PM 6.7 (H) 4.6 - 6.5 % Final    Comment:    Glycemic Control Guidelines for People with Diabetes:Non Diabetic:  <6%Goal of Therapy: <7%Additional Action Suggested:  >8%   06/11/2021 10:57 AM 6.4 4.6 - 6.5 % Final    Comment:    Glycemic Control Guidelines for People with Diabetes:Non Diabetic:  <6%Goal of Therapy: <7%Additional Action Suggested:  >8%     CBG: Recent Labs  Lab 02/27/22 1028 02/28/22 0857  GLUCAP 126* 121*    Review of Systems:   Headache  Past Medical History:  She,  has a past medical history of Anemia, Anxiety, Arthritis, Asthma, Coronary artery disease, Depression, Diabetes mellitus without complication ([place]), Fibromyalgia, Heart murmur, Hyperparathyroidism ([place]), Hypertension, Hypothyroidism, Polymyalgia rheumatica ([place]), Sleep difficulties, Thyroid disease, and Vitamin B 12 deficiency.   Surgical History:   Past Surgical History:  Procedure Laterality Date   ABDOMINAL HYSTERECTOMY     BARIATRIC SURGERY     CARDIAC CATHETERIZATION     [facility]- 30 yrs. ago   CATARACT EXTRACTION, BILATERAL     late 2018   LEFT HEART CATH AND CORONARY ANGIOGRAPHY N/A 04/02/2018   Procedure: LEFT HEART CATH AND CORONARY ANGIOGRAPHY;  Surgeon: Martinique, Peter M, MD;  Location: [facility];  Service: Cardiovascular;  Laterality: N/A;   OOPHORECTOMY     pantallor arthrodesis with rod placement left foot     PARATHYROIDECTOMY Left 03/16/2021   Procedure: LEFT INFERIOR PARATHYROIDECTOMY;  Surgeon: Armandina Gemma, MD;  Location: [facility];  Service: General;  Laterality:  Left;   TONSILLECTOMY     TOTAL KNEE ARTHROPLASTY Left 11/30/2018   Procedure: TOTAL KNEE ARTHROPLASTY;  Surgeon: Gaynelle Arabian, MD;  Location: [facility];  Service: Orthopedics;  Laterality: Left;   TOTAL KNEE ARTHROPLASTY Right 06/28/2019   Procedure: TOTAL KNEE ARTHROPLASTY;  Surgeon: Gaynelle Arabian, MD;  Location: [facility];  Service: Orthopedics;  Laterality: Right;  3mn   TOTAL SHOULDER ARTHROPLASTY Left 03/12/2012   Procedure: TOTAL SHOULDER ARTHROPLASTY;  Surgeon: KMarin Shutter MD;  Location[facility]  Service: Orthopedics;  Laterality: Left;     Social History:   reports that she quit smoking about 44 years ago. Her smoking use included cigarettes. She has a 14.00 pack-year smoking history. She has never used smokeless tobacco. She reports current alcohol use of about 2.0 standard drinks of alcohol per week. She reports that she does not use drugs.   Family History:  Her family history includes Cancer  in her father; Liver cancer in her father; Stroke in her mother. There is no history of Breast cancer.   Allergies Allergies  Allergen Reactions   Flexeril [Cyclobenzaprine] Other (See Comments)    On Pristiq- possible serotonin reaction to flexeril can not take together   Ace Inhibitors Other (See Comments) and Swelling    States after a surgery she was told to "not take" this class of drugs- not 100% sure what the reason was Possible angioedema during/after general anesthesia States after a surgery she was told to "not take" this class of drugs- not 100% sure what the reason was States after a surgery she was told to "not take" this class of drugs- not 100% sure what the reason was    Codeine Nausea Only and Other (See Comments)    Upset stomach   Kiwi Extract Other (See Comments)   Metoprolol     Edema, fatigue, headache, low HR into 40s   Azithromycin Rash    The patient is critically ill with multiple organ systems failure and requires high complexity decision making for  assessment and support, frequent evaluation and titration of therapies, application of advanced monitoring technologies and extensive interpretation of multiple databases. Critical Care Time devoted to patient care services described in this note independent of APP/resident time (if applicable)  is 31 minutes.   Sherrilyn Rist MD [facility] Personal pager: See Amion If unanswered, please page CCM On-call: [phone]

## 2022-02-28 NOTE — Progress Notes (Signed)
  Transition of Care [facility]) Screening Note   Patient Details  Name: [name] Date of Birth: 21-Mar-1940   Transition of Care [facility]) CM/SW Contact:    Roseanne Kaufman, RN Phone Number: 02/28/2022, 4:44 PM    Transition of Care Department [facility]) has reviewed patient and no TOC needs have been identified at this time. We will continue to monitor patient advancement through interdisciplinary progression rounds. If new patient transition needs arise, please place a TOC consult.

## 2022-02-28 NOTE — Progress Notes (Addendum)
eLink Physician-Brief Progress Note Patient Name: [name] DOB: 1940/05/20 MRN: [id number]   Date of Service  02/28/2022  HPI/Events of Note  Patient requesting home Levothyroxine.   eICU Interventions  Plan: Levothyroxine 150 mg PO Q day.  Note patient actually takes 300 mcgs PO on Mondays; however, I don't know how to alter Monday's dose so that Epic will accept it. Will defer adjustment for Moday's dose to PCCM rounding team.     Intervention Category Major Interventions: Other:  Crestina Strike Cornelia Copa 02/28/2022, 6:07 AM

## 2022-02-28 NOTE — Progress Notes (Signed)
Notified MD Olalere in regards to patient's BP trending upwards. MD Olalere ordered scheduled hydralazine PO.

## 2022-03-01 ENCOUNTER — Encounter (HOSPITAL_COMMUNITY): Payer: Self-pay | Admitting: Pulmonary Disease

## 2022-03-01 DIAGNOSIS — E119 Type 2 diabetes mellitus without complications: Secondary | ICD-10-CM | POA: Diagnosis not present

## 2022-03-01 DIAGNOSIS — E039 Hypothyroidism, unspecified: Secondary | ICD-10-CM

## 2022-03-01 DIAGNOSIS — I10 Essential (primary) hypertension: Secondary | ICD-10-CM

## 2022-03-01 DIAGNOSIS — S065XAA Traumatic subdural hemorrhage with loss of consciousness status unknown, initial encounter: Secondary | ICD-10-CM | POA: Diagnosis not present

## 2022-03-01 LAB — GLUCOSE, CAPILLARY
Glucose-Capillary: 113 mg/dL — ABNORMAL HIGH (ref 70–99)
Glucose-Capillary: 94 mg/dL (ref 70–99)
Glucose-Capillary: 124 mg/dL — ABNORMAL HIGH (ref 70–99)
Glucose-Capillary: 112 mg/dL — ABNORMAL HIGH (ref 70–99)

## 2022-03-01 LAB — TRIGLYCERIDES: Triglycerides: 76 mg/dL (ref <150)

## 2022-03-01 MED ORDER — HYDRALAZINE HCL 10 MG PO TABS: 10.0000 mg | ORAL_TABLET | Freq: Three times a day (TID) | ORAL | 2 refills | Status: DC

## 2022-03-01 MED ORDER — LEVETIRACETAM 500 MG PO TABS: 500.0000 mg | ORAL_TABLET | Freq: Two times a day (BID) | ORAL | 0 refills | Status: DC

## 2022-03-01 MED ORDER — POLYVINYL ALCOHOL 1.4 % OP SOLN
1.0000 [drp] | OPHTHALMIC | Status: DC | PRN
  Administered 2022-03-02: 1 [drp] via OPHTHALMIC
  Filled 2022-03-01 (×2): qty 15

## 2022-03-01 NOTE — Progress Notes (Signed)
OT Cancellation Note  Patient Details Name: MARIXSA COLLEN MRN: [id number] DOB: Apr 05, 1940   Cancelled Treatment:    Reason Eval/Treat Not Completed: OT screened, no needs identified, will sign off. Patient not requiring assistance with ADLs and is moving well with nursing.   Carlton Sweaney L Lindaann Gradilla 03/01/2022, 11:38 AM

## 2022-03-01 NOTE — Evaluation (Addendum)
Physical Therapy Evaluation Patient Details Name: [name] MRN: [id number] DOB: 06-24-1940 Today's Date: 03/01/2022  History of Present Illness  Pt is 82 yo female admitted 02/27/22 s/p fall (while helping someone with a hand truck) with subdural hematoma.  Repeat CT stable. Pt with hx including but not limited to arthritis, CAD, DM, fibromyalgia, HTN, bil TKA  Clinical Impression  Pt admitted with above diagnosis. At baseline pt is completely independent and uses a rollator to ambulate.  She lives alone but has capability for supervision at d/c for short term as needed.  Today, pt ambulated 400' with RW and supervision/min guard with good balance.  She had one episode of drifting but no overt LOB.  Pt reports mildly lightheaded/feels off but all VSS.  States she feels close to baseline but does have mild unsteadiness and "feels off."  From PT perspective pt with mobility and support necessary for return home when medically cleared by MD>  Pt currently with functional limitations due to the deficits listed below (see PT Problem List). Pt will benefit from skilled PT to increase their independence and safety with mobility to allow discharge to the venue listed below.          Recommendations for follow up therapy are one component of a multi-disciplinary discharge planning process, led by the attending physician.  Recommendations may be updated based on patient status, additional functional criteria and insurance authorization.  Follow Up Recommendations Home health PT (updated 03/01/22 at 1500)      Assistance Recommended at Discharge PRN  Patient can return home with the following  Assistance with cooking/housework;Help with stairs or ramp for entrance    Equipment Recommendations None recommended by PT  Recommendations for Other Services       Functional Status Assessment Patient has had a recent decline in their functional status and demonstrates the ability to make significant  improvements in function in a reasonable and predictable amount of time.     Precautions / Restrictions Precautions Precautions: Fall      Mobility  Bed Mobility Overal bed mobility: Independent Bed Mobility: Supine to Sit, Sit to Supine     Supine to sit: Independent Sit to supine: Independent        Transfers Overall transfer level: Needs assistance Equipment used: Rolling walker (2 wheels) Transfers: Sit to/from Stand             General transfer comment: performed x 3    Ambulation/Gait Ambulation/Gait assistance: Min guard, Supervision Gait Distance (Feet): 400 Feet Assistive device: Rolling walker (2 wheels) Gait Pattern/deviations: Step-through pattern Gait velocity: decreased     General Gait Details: Tends to hold RW slightly too far forward but pt also used to a rollator. One episode of drifting toward wall but no overt LOB  Stairs            Wheelchair Mobility    Modified Rankin (Stroke Patients Only)       Balance Overall balance assessment: Needs assistance Sitting-balance support: No upper extremity supported Sitting balance-Leahy Scale: Good     Standing balance support: No upper extremity supported, Bilateral upper extremity supported Standing balance-Leahy Scale: Fair Standing balance comment: RW to ambulate but able to do ADLs in standing without support                             Pertinent Vitals/Pain Pain Assessment Pain Assessment: Faces Faces Pain Scale: Hurts a little bit Pain  Location: headache Pain Descriptors / Indicators: Aching Pain Intervention(s): Limited activity within patient's tolerance, Monitored during session    [place] expects to be discharged to:: Private residence Living Arrangements: Alone Available Help at Discharge: Family;Available PRN/intermittently (family could be available 24 hr) Type of Home: Mobile home Home Access: Stairs to enter Entrance Stairs-Rails:  Right Entrance Stairs-Number of Steps: 4   Home Layout: One level Home Equipment: Shower seat;Transport chair;BSC/3in1;Grab bars - tub/shower Additional Comments: Tripod rollator    Prior Function Prior Level of Function : Independent/Modified Independent;Driving             Mobility Comments: Uses tripod rollator all of the time and for community ambulation ADLs Comments: Independent with adls and light IADLs; drives     Hand Dominance   Dominant Hand: Right    Extremity/Trunk Assessment   Upper Extremity Assessment Upper Extremity Assessment: Overall WFL for tasks assessed    Lower Extremity Assessment Lower Extremity Assessment: Overall WFL for tasks assessed    Cervical / Trunk Assessment Cervical / Trunk Assessment: Kyphotic  Communication   Communication: No difficulties  Cognition Arousal/Alertness: Awake/alert Behavior During Therapy: WFL for tasks assessed/performed Overall Cognitive Status: Within Functional Limits for tasks assessed                                 General Comments: Pt with occasional word finding difficulties but pt and daughter reports seems to be baseline.  Pt able to say months of years backwards while walking        General Comments General comments (skin integrity, edema, etc.): Pt is retired Therapist, sports.  She is very aware of signs of concussion or signs of worsening SDH.  In addition to being retired Therapist, sports, pt's Mother had SDH that worsened over time, so pt aware of possible symptoms to monitor.  Daughter also plans to stay with or have someone stay with pt at d/c.    Exercises     Assessment/Plan    PT Assessment Patient needs continued PT services  PT Problem List Decreased activity tolerance;Decreased mobility;Decreased balance       PT Treatment Interventions DME instruction;Therapeutic exercise;Gait training;Balance training;Stair training;Functional mobility training;Therapeutic activities;Patient/family  education;Neuromuscular re-education    PT Goals (Current goals can be found in the Care Plan section)  Acute Rehab PT Goals Patient Stated Goal: return home PT Goal Formulation: With patient/family Time For Goal Achievement: 03/15/22 Potential to Achieve Goals: Good    Frequency Min 3X/week     Co-evaluation               AM-PAC PT "6 Clicks" Mobility  Outcome Measure Help needed turning from your back to your side while in a flat bed without using bedrails?: None Help needed moving from lying on your back to sitting on the side of a flat bed without using bedrails?: None Help needed moving to and from a bed to a chair (including a wheelchair)?: A Little Help needed standing up from a chair using your arms (e.g., wheelchair or bedside chair)?: A Little Help needed to walk in hospital room?: A Little Help needed climbing 3-5 steps with a railing? : A Little 6 Click Score: 20    End of Session Equipment Utilized During Treatment: Gait belt Activity Tolerance: Patient tolerated treatment well Patient left: in bed;with call bell/phone within reach;with bed alarm set Nurse Communication: Mobility status PT Visit Diagnosis: Other abnormalities of gait and  mobility (R26.89)    Time: [id number] PT Time Calculation (min) (ACUTE ONLY): 36 min   Charges:   PT Evaluation $PT Eval Low Complexity: 1 Low PT Treatments $Gait Training: 8-22 mins        Abran Richard, PT Acute Rehab [facility] [phone]   Karlton Lemon 03/01/2022, 3:00 PM

## 2022-03-01 NOTE — Progress Notes (Signed)
PT  Note  Patient Details Name: [name] MRN: [id number] DOB: 11/10/1940   Received message back from RN and NT that pt's daughter expressed concern about pt returning home alone and asking for rehab. Stopped back by to talk to pt.  Daughter was not present.  Pt reports daughter was concerned with her taking meds and having a SDH and was afraid she would pass out alone.  Reports daughter could stay over the weekend (during session daughter had stated "could set up 24 hr care as long as needed.") Pt also reports feeling wiped out after walking and shower, "I would feel better if I could just stay the night to recover."  Did discuss no SNF needs at d/c but would add HHPT to recommendation since pt feeling fatigued and family later expressed concern about safety.  Encouraged pt to be up and moving with nursing as therapy may not be able to return tomorrow. Notified RN and MD in secure chat.   Abran Richard, PT Acute Rehab [facility] [phone]   Karlton Lemon 03/01/2022, 3:01 PM

## 2022-03-01 NOTE — TOC Initial Note (Signed)
Transition of Care [facility]) - Initial/Assessment Note    Patient Details  Name: [name] MRN: [id number] Date of Birth: [birth year]  Transition of Care [facility]) CM/SW Contact:    Vassie Moselle, LCSW Phone Number: 03/01/2022, 4:21 PM  Clinical Narrative:                 Met with pt at bedside to discuss [place]. Pt shares she has concerns with going home and have LOC and falling again. CSW shared with pt that she did very well while working with PT as she was able to ambulate 400 ft with supervision. CSW shared that HHPT can be arranged, however their services are typically 2-3 days a week for an hour at a time and they would not be able to prevent LOC.  CSW spoke with pt's daughter to discuss. Pt's daughter feels that pt should go to rehab for a few days. CSW shared she has no skillable need and insurance would not approve for SNF placement based on PT evaluation. Pt's daughter would like HH but, daughter has unrealistic expectations of home health services. CSW provided information for home care agencies that can be explored for in home care. Daughter is agreeable to having HHPT arranged and understands that insurance may not approve or may not approve services for long based on how she does when seen by [facility].  HHPT has been arranged with [facility],   Expected Discharge Plan: Home/Self Care Barriers to Discharge: No Barriers Identified   Patient Goals and CMS Choice Patient states their goals for this hospitalization and ongoing recovery are:: To go home CMS Medicare.gov Compare Post Acute Care list provided to:: Patient Choice offered to / list presented to : Patient, Adult Children      Expected Discharge Plan and Services In-house Referral: NA Discharge Planning Services: NA Post Acute Care Choice: Home Health Living arrangements for the past 2 months: Single Family Home Expected Discharge Date: 03/01/22               DME Arranged: N/A DME Agency: NA       HH Arranged: PT HH  Agency: [place] Date [place]: 03/01/22 Time HH Agency Contacted: 29 Representative spoke with at [place]: [place] Arrangements/Services Living arrangements for the past 2 months: [place] with:: Self Patient language and need for interpreter reviewed:: Yes Do you feel safe going back to the place where you live?: Yes      Need for Family Participation in Patient Care: No (Comment) Care giver support system in place?: No (comment) Current home services: DME Criminal Activity/Legal Involvement Pertinent to Current Situation/Hospitalization: No - Comment as needed  Activities of Daily Living Home Assistive Devices/Equipment: Walker (specify type) ADL Screening (condition at time of admission) Patient's cognitive ability adequate to safely complete daily activities?: Yes Is the patient deaf or have difficulty hearing?: No Does the patient have difficulty seeing, even when wearing glasses/contacts?: No Does the patient have difficulty concentrating, remembering, or making decisions?: No Patient able to express need for assistance with ADLs?: No Does the patient have difficulty dressing or bathing?: No Independently performs ADLs?: Yes (appropriate for developmental age) Does the patient have difficulty walking or climbing stairs?: No Weakness of Legs: None Weakness of Arms/Hands: None  Permission Sought/Granted Permission sought to share information with : Family Supports, Chartered certified accountant granted to share information with : Yes, Verbal Permission Granted  Share Information with NAME: Doristine Church  Permission granted to share info w AGENCY: [facility]  Permission granted to share info w Relationship: Daughter  Permission granted to share info w Contact Information: [phone]  Emotional Assessment Appearance:: Appears stated age Attitude/Demeanor/Rapport: Engaged Affect (typically observed):  Calm Orientation: : Oriented to Self, Oriented to Place, Oriented to  Time, Oriented to Situation Alcohol / Substance Use: Not Applicable Psych Involvement: No (comment)  Admission diagnosis:  Subdural hematoma ([place]) [S06.5XAA] SDH (subdural hematoma) ([place]) [S06.5XAA] Patient Active Problem List   Diagnosis Date Noted   Subdural hematoma ([place]) 02/27/2022   Hyperparathyroidism, primary ([place]) 03/14/2021   Aortic atherosclerosis ([place]) 02/16/2021   Mild aortic stenosis 11/16/2020   Osteoporosis 10/29/2020   Primary osteoarthritis of right knee 06/28/2019   B12 deficiency 05/31/2019   History of polymyalgia rheumatica 03/16/2018   CAD (coronary artery disease) 11/19/2017   Overactive bladder 09/03/2016   Morbid obesity ([place]) 06/28/2015   Hypercalcemia 12/29/2014   Allergic rhinitis 07/20/2014   History of gastric bypass 02/08/2014   OA (osteoarthritis) of knee 10/05/2013   Hyperlipidemia associated with type 2 diabetes mellitus ([place]) 01/05/2008   CHEST WALL PAIN, ANTERIOR 10/09/2007   Hypothyroidism 09/09/2007   Well controlled type 2 diabetes mellitus ([place]) 07/18/2006   Major depression, recurrent, full remission ([place]) 07/18/2006   Essential hypertension 07/18/2006   Asthma 07/18/2006   PCP:  Marin Olp, MD Pharmacy:   [place], [place] [address] Phone: [phone] Fax: [phone]  [facility] [place], [place] [address] Phone: [phone] Fax: [place] [address] Phone: [phone] Fax: [phone]  [place] [id number] - [address], [place] [address] [place] [address] [place] [address] Phone: [phone] Fax: [phone]  [id number]: [phone]: [phone]    Social Determinants of Health (SDOH) Social History: SDOH Screenings   Food Insecurity: No Food Insecurity (03/01/2022)  Housing: Low Risk  (03/01/2022)  Transportation Needs: No Transportation Needs (03/01/2022)  Utilities: Not At Risk (03/01/2022)  Depression (PHQ2-9): Low Risk  (11/06/2021)  Financial Resource Strain: Low Risk  (02/12/2021)  Physical Activity: Inactive (02/12/2021)  Social Connections: Moderately Isolated (02/12/2021)  Stress: No Stress Concern Present (02/12/2021)  Tobacco Use: Medium Risk (03/01/2022)   SDOH Interventions:     Readmission Risk Interventions    03/01/2022    3:57 PM  Readmission Risk Prevention Plan  Post Dischage Appt Complete  Medication Screening Complete  Transportation Screening Complete

## 2022-03-01 NOTE — Discharge Summary (Addendum)
Physician Discharge Summary  [name] [id number] DOB: Jan 08, 1941 DOA: 02/27/2022  PCP: Marin Olp, MD  Admit date: 02/27/2022 Discharge date: 03/01/2022  Admitted From: Home  Discharge disposition: Home with home health   Recommendations for Outpatient Follow-Up:   Follow up with your primary care provider in one week.  Check CBC, BMP, magnesium in the next visit  Discharge Diagnosis:   Principal Problem:   Subdural hematoma (HCC) Active Problems:   Hypothyroidism   Well controlled type 2 diabetes mellitus ([place])   Essential hypertension   Discharge Condition: Improved.  Diet recommendation: Low sodium, heart healthy.  Carbohydrate-modified.    Wound care: None.  Code status: Full.  History of Present Illness:   Patient is 82 years old female with past medical history of coronary artery disease, diabetes mellitus, hypertension, hypothyroidism, polymyalgia rheumatica, asthma presented to the hospital after sustaining a mechanical fall which was accidental after she tripped and fell on 02/27/22.  She complained of headache. In the ED, CT head scan showed left subdural hematoma, interhemispheric hematoma and right parietal scalp hematoma.  Patient was noted to have uncontrolled blood pressure and was started on cleviprex drip and was admitted to the ICU for further evaluation and treatment.  Hospital Course:   Following conditions were addressed during hospitalization as listed below,  Traumatic subdural hematoma secondary to mechanical fall Patient had uncontrolled blood pressure on presentation.  Patient was initially admitted to the ICU on Cleviprex drip.  Repeated a CT scan was done in 6 hours with showed stable left-sided subdural hematoma without mass effect or midline shift.  Neurosurgery was consulted and did not recommend any neurosurgical intervention.  Patient has been started on amlodipine and hydralazine has been added.  Patient has been started on  Keppra 500 mg twice a day and neurosurgery recommends a total of 7-day course.  Continue Keppra for 5 more days to complete 7-day course.  Patient was seen by physical therapy and did not recommend any specific therapy needs on discharge.  Hypertensive urgency on presentation.  Was on Cleviprex drip initially.  Has been started on amlodipine, hydralazine.  Will continue hydralazine on discharge.  Blood pressure has improved at this time.   History of coronary artery disease No acute issues at this time.  History of depression -On Pristiq   History of asthma As needed albuterol.  Currently compensated.   History of hypothyroidism Continue Synthroid  Morbid obesity.  Present on admission.Body mass index is 42.74 kg/m.  Would benefit from weight loss as outpatient.  Disposition.  At this time, patient is stable for disposition home with outpatient PCP follow-up.  Medical Consultants:   PCCM Neurosurgery  Procedures:    None Subjective:   Today, patient was seen and examined at bedside.  Denies any nausea vomiting fever chills or rigor.  Has some pain over the scalp at the site of trauma.  Discharge Exam:   Vitals:   03/01/22 0552 03/01/22 1437  BP: (!) 148/77 138/67  Pulse: 66 61  Resp: 18 (!) 22  Temp: 99.2 F (37.3 C) 98.4 F (36.9 C)  SpO2: 90% 91%   Vitals:   02/28/22 2046 03/01/22 0009 03/01/22 0552 03/01/22 1437  BP: (!) 151/77 (!) 140/61 (!) 148/77 138/67  Pulse: 73 71 66 61  Resp: 20 18 18 $ (!) 22  Temp: 98.3 F (36.8 C) 98.9 F (37.2 C) 99.2 F (37.3 C) 98.4 F (36.9 C)  TempSrc: Oral Oral Oral Oral  SpO2: 92% 90%  90% 91%  Weight:      Height:       Body mass index is 42.74 kg/m.   General: Alert awake, not in obvious distress, morbidly obese HENT: pupils equally reacting to light,  No scleral pallor or icterus noted. Oral mucosa is moist.  Chest:  Clear breath sounds.  Diminished breath sounds bilaterally. No crackles or wheezes.  CVS: S1 &S2  heard. No murmur.  Regular rate and rhythm. Abdomen: Soft, nontender, nondistended.  Bowel sounds are heard.   Extremities: No cyanosis, clubbing or edema.  Peripheral pulses are palpable. Psych: Alert, awake and oriented, normal mood CNS:  No cranial nerve deficits.  Power equal in all extremities.   Skin: Warm and dry.  No rashes noted.  Hematoma at the posterior scalp.  Mild bruise over the lateral side of the right neck.  The results of significant diagnostics from this hospitalization (including imaging, microbiology, ancillary and laboratory) are listed below for reference.     Diagnostic Studies:   CT Head Wo Contrast  Result Date: 02/27/2022 CLINICAL DATA:  [place], hit head, intracranial hemorrhage on previous CT EXAM: CT HEAD WITHOUT CONTRAST TECHNIQUE: Contiguous axial images were obtained from the base of the skull through the vertex without intravenous contrast. RADIATION DOSE REDUCTION: This exam was performed according to the departmental dose-optimization program which includes automated exposure control, adjustment of the mA and/or kV according to patient size and/or use of iterative reconstruction technique. COMPARISON:  02/27/2022 at 11:34 a.m. FINDINGS: Brain: Small falcine subdural hematoma is again identified measuring up to 2 mm in thickness, without associated mass effect. The left-sided subdural hematoma seen previously is again noted, without appreciable change. This measures up to 5 mm in maximal thickness reference image 26/4, previously measuring 6 mm in a similar location. No mass effect or midline shift. There are no new areas of intracranial hemorrhage. No evidence of acute infarct. Stable chronic small-vessel ischemic changes throughout the white matter and basal ganglia. Lateral ventricles and remaining midline structures are unremarkable. Vascular: No hyperdense vessel or unexpected calcification. Stable atherosclerosis. Skull: Large right parieto-occipital scalp hematoma  has increased since prior study. No underlying fracture. The remainder of the calvarium is unremarkable. Sinuses/Orbits: No acute finding. Other: None. IMPRESSION: 1. Grossly stable falcine and left-sided subdural hematomas as above, without mass effect or midline shift. 2. No new areas of hemorrhage. 3. Stable chronic ischemic changes.  No acute infarct. 4. Increased size of the right parietooccipital scalp hematoma seen previously. No underlying fracture. Electronically Signed   By: Randa Ngo M.D.   On: 02/27/2022 16:50   CT Head Wo Contrast  Result Date: 02/27/2022 CLINICAL DATA:  Head/neck trauma. Mechanical fall after church. Head strike on concrete. EXAM: CT HEAD WITHOUT CONTRAST CT CERVICAL SPINE WITHOUT CONTRAST TECHNIQUE: Multidetector CT imaging of the head and cervical spine was performed following the standard protocol without intravenous contrast. Multiplanar CT image reconstructions of the cervical spine were also generated. RADIATION DOSE REDUCTION: This exam was performed according to the departmental dose-optimization program which includes automated exposure control, adjustment of the mA and/or kV according to patient size and/or use of iterative reconstruction technique. COMPARISON:  None Available. FINDINGS: CT HEAD FINDINGS Brain: Acute, thin interhemispheric subdural hematoma, measuring up to 3 mm in thickness along the left anterior aspect of the falx (image 9 series 3). Acute left convexity subdural hematoma, measuring up to 6 mm along the inferior left frontal lobe (coronal image 24 series 5). No midline shift. Severe, confluence  hypoattenuation of the cerebral white matter, most consistent with chronic small-vessel disease. Cortical gray-white differentiation is preserved. No hydrocephalus. Basilar cisterns are patent. Vascular: No hyperdense vessel or unexpected calcification. Skull: No calvarial fracture or suspicious bone lesion. Skull base is unremarkable. Sinuses/Orbits:  Paranasal sinuses, mastoid air cells, and middle ear cavities are well aerated. Orbits are unremarkable. Other: Large right parietal scalp hematoma. CT CERVICAL SPINE FINDINGS Alignment: Degenerative reversal of the normal cervical lordosis. 3 mm degenerative anterolisthesis of C4 on C5 with associated facet arthropathy. Skull base and vertebrae: No acute fracture. Normal craniocervical junction. Diffusely demineralized appearance of the bones. Soft tissues and spinal canal: No prevertebral fluid or swelling. No visible canal hematoma. Disc levels: Multilevel spondylosis with at least mild spinal canal stenosis at C4-5 and C5-6. Upper chest: Atherosclerotic calcifications of the aortic arch and arch vessel origins. Other: None. IMPRESSION: CT HEAD: 1. Acute left convexity subdural hematoma, measuring up to 6 mm in thickness. No midline shift. 2. Acute, thin interhemispheric subdural hematoma, measuring up to 3 mm in thickness. 3. Large right parietal scalp hematoma. No underlying calvarial fracture. CT CERVICAL SPINE: 1. No acute fracture or traumatic listhesis. 2. Multilevel spondylosis with at least mild spinal canal stenosis at C4-5 and C5-6. Aortic Atherosclerosis (ICD10-I70.0). Critical Value/emergent results were called by telephone at the time of interpretation on 02/27/2022 at 12:06 pm to provider DAN FLOYD , who verbally acknowledged these results. Electronically Signed   By: Emmit Alexanders M.D.   On: 02/27/2022 12:09   CT Cervical Spine Wo Contrast  Result Date: 02/27/2022 CLINICAL DATA:  Head/neck trauma. Mechanical fall after church. Head strike on concrete. EXAM: CT HEAD WITHOUT CONTRAST CT CERVICAL SPINE WITHOUT CONTRAST TECHNIQUE: Multidetector CT imaging of the head and cervical spine was performed following the standard protocol without intravenous contrast. Multiplanar CT image reconstructions of the cervical spine were also generated. RADIATION DOSE REDUCTION: This exam was performed according  to the departmental dose-optimization program which includes automated exposure control, adjustment of the mA and/or kV according to patient size and/or use of iterative reconstruction technique. COMPARISON:  None Available. FINDINGS: CT HEAD FINDINGS Brain: Acute, thin interhemispheric subdural hematoma, measuring up to 3 mm in thickness along the left anterior aspect of the falx (image 9 series 3). Acute left convexity subdural hematoma, measuring up to 6 mm along the inferior left frontal lobe (coronal image 24 series 5). No midline shift. Severe, confluence hypoattenuation of the cerebral white matter, most consistent with chronic small-vessel disease. Cortical gray-white differentiation is preserved. No hydrocephalus. Basilar cisterns are patent. Vascular: No hyperdense vessel or unexpected calcification. Skull: No calvarial fracture or suspicious bone lesion. Skull base is unremarkable. Sinuses/Orbits: Paranasal sinuses, mastoid air cells, and middle ear cavities are well aerated. Orbits are unremarkable. Other: Large right parietal scalp hematoma. CT CERVICAL SPINE FINDINGS Alignment: Degenerative reversal of the normal cervical lordosis. 3 mm degenerative anterolisthesis of C4 on C5 with associated facet arthropathy. Skull base and vertebrae: No acute fracture. Normal craniocervical junction. Diffusely demineralized appearance of the bones. Soft tissues and spinal canal: No prevertebral fluid or swelling. No visible canal hematoma. Disc levels: Multilevel spondylosis with at least mild spinal canal stenosis at C4-5 and C5-6. Upper chest: Atherosclerotic calcifications of the aortic arch and arch vessel origins. Other: None. IMPRESSION: CT HEAD: 1. Acute left convexity subdural hematoma, measuring up to 6 mm in thickness. No midline shift. 2. Acute, thin interhemispheric subdural hematoma, measuring up to 3 mm in thickness. 3. Large right parietal scalp  hematoma. No underlying calvarial fracture. CT CERVICAL  SPINE: 1. No acute fracture or traumatic listhesis. 2. Multilevel spondylosis with at least mild spinal canal stenosis at C4-5 and C5-6. Aortic Atherosclerosis (ICD10-I70.0). Critical Value/emergent results were called by telephone at the time of interpretation on 02/27/2022 at 12:06 pm to provider DAN FLOYD , who verbally acknowledged these results. Electronically Signed   By: Emmit Alexanders M.D.   On: 02/27/2022 12:09     Labs:   Basic Metabolic Panel: Recent Labs  Lab 02/27/22 1023 02/28/22 0245  NA 137 138  K 3.8 3.5  CL 97* 98  CO2 27 28  GLUCOSE 143* 121*  BUN 11 16  CREATININE 0.94 0.97  CALCIUM 8.9 8.5*   GFR Estimated Creatinine Clearance: 50 mL/min (by C-G formula based on SCr of 0.97 mg/dL). Liver Function Tests: Recent Labs  Lab 02/27/22 1023  AST 26  ALT 20  ALKPHOS 67  BILITOT 0.6  PROT 7.0  ALBUMIN 3.9   Recent Labs  Lab 02/27/22 1023  LIPASE 36   No results for input(s): "AMMONIA" in the last 168 hours. Coagulation profile No results for input(s): "INR", "PROTIME" in the last 168 hours.  CBC: Recent Labs  Lab 02/27/22 1023 02/28/22 0245  WBC 6.6 7.3  NEUTROABS 3.7  --   HGB 13.3 12.0  HCT 41.7 37.9  MCV 91.2 92.9  PLT 201 198   Cardiac Enzymes: No results for input(s): "CKTOTAL", "CKMB", "CKMBINDEX", "TROPONINI" in the last 168 hours. BNP: Invalid input(s): "POCBNP" CBG: Recent Labs  Lab 02/28/22 1340 02/28/22 1631 02/28/22 2047 03/01/22 0721 03/01/22 1125  GLUCAP 89 145* 94 113* 94   D-Dimer No results for input(s): "DDIMER" in the last 72 hours. Hgb A1c No results for input(s): "HGBA1C" in the last 72 hours. Lipid Profile Recent Labs    03/01/22 0421  TRIG 76   Thyroid function studies No results for input(s): "TSH", "T4TOTAL", "T3FREE", "THYROIDAB" in the last 72 hours.  Invalid input(s): "FREET3" Anemia work up No results for input(s): "VITAMINB12", "FOLATE", "FERRITIN", "TIBC", "IRON", "RETICCTPCT" in the last 72  hours. Microbiology Recent Results (from the past 240 hour(s))  MRSA Next Gen by PCR, Nasal     Status: None   Collection Time: 02/27/22  5:32 PM   Specimen: Nasal Mucosa; Nasal Swab  Result Value Ref Range Status   MRSA by PCR Next Gen NOT DETECTED NOT DETECTED Final    Comment: (NOTE) The GeneXpert MRSA Assay (FDA approved for NASAL specimens only), is one component of a comprehensive MRSA colonization surveillance program. It is not intended to diagnose MRSA infection nor to guide or monitor treatment for MRSA infections. Test performance is not FDA approved in patients less than 65 years old. Performed at [facility], [place] [address], [place], [place] [address]      Discharge Instructions:   Discharge Instructions     Diet - low sodium heart healthy   Complete by: As directed    Discharge instructions   Complete by: As directed    Follow up with your primary care provider in one week.  Please closely monitor your blood pressure as outpatient.  Seek medical attention for worsening symptoms including headache, vomiting visual disturbances or difficulty with ambulation.   Increase activity slowly   Complete by: As directed       Allergies as of 03/01/2022       Reactions   Flexeril [cyclobenzaprine] Other (See Comments)   On Pristiq- possible serotonin  reaction to flexeril can not take together   Ace Inhibitors Other (See Comments), Swelling   States after a surgery she was told to "not take" this class of drugs- not 100% sure what the reason was Possible angioedema during/after general anesthesia States after a surgery she was told to "not take" this class of drugs- not 100% sure what the reason was States after a surgery she was told to "not take" this class of drugs- not 100% sure what the reason was   Codeine Nausea Only, Other (See Comments)   Upset stomach   Kiwi Extract Other (See Comments)   Metoprolol    Edema, fatigue, headache, low HR into  40s   Azithromycin Rash        Medication List     STOP taking these medications    aspirin EC 81 MG tablet       TAKE these medications    acetaminophen 500 MG tablet Commonly known as: TYLENOL Take 1,000 mg by mouth at bedtime as needed for mild pain. Can take up to three times day   albuterol 108 (90 Base) MCG/ACT inhaler Commonly known as: VENTOLIN HFA Inhale 1-2 puffs into the lungs every 4 (four) hours as needed for wheezing or shortness of breath.   amLODipine 10 MG tablet Commonly known as: NORVASC TAKE 1 TABLET EVERY DAY   Biotin 5000 MCG Caps Take 5,000 mcg by mouth daily.   cyanocobalamin 1000 MCG/ML injection Commonly known as: VITAMIN B12 INJECT 1ML (1,000 MCG TOTAL)  INTO THE MUSCLE EVERY 30 DAYS USE ON A MONTHLY BASES   desvenlafaxine 100 MG 24 hr tablet Commonly known as: PRISTIQ TAKE 1 TABLET EVERY DAY   diclofenac 75 MG EC tablet Commonly known as: VOLTAREN Take 1 tablet (75 mg total) by mouth 2 (two) times daily.   fexofenadine 180 MG tablet Commonly known as: ALLEGRA Take 180 mg by mouth daily as needed for allergies or rhinitis.   Fish Oil 1000 MG Caps Take 1,000 mg by mouth daily.   fluticasone 50 MCG/ACT nasal spray Commonly known as: FLONASE Place 1 spray into both nostrils daily.   glucose blood test strip Test blood sugar once per day   hydrALAZINE 10 MG tablet Commonly known as: APRESOLINE Take 1 tablet (10 mg total) by mouth 3 (three) times daily.   HYDROcodone-acetaminophen 5-325 MG tablet Commonly known as: NORCO/VICODIN Take 1-2 tablets by mouth every 6 (six) hours as needed for moderate pain.   levETIRAcetam 500 MG tablet Commonly known as: KEPPRA Take 1 tablet (500 mg total) by mouth 2 (two) times daily for 5 days.   Levothyroxine Sodium 150 MCG Caps Take 150 mcg by mouth daily.   loperamide 2 MG capsule Commonly known as: IMODIUM Take 4 mg by mouth daily as needed for diarrhea or loose stools.    nitroGLYCERIN 0.4 MG SL tablet Commonly known as: NITROSTAT Place 1 tablet (0.4 mg total) under the tongue every 5 (five) minutes as needed for chest pain.   Ozempic (0.25 or 0.5 MG/DOSE) 2 MG/3ML Sopn Generic drug: Semaglutide(0.25 or 0.5MG/DOS) Inject 0.25 mg into the skin once a week for 28 days, THEN 0.5 mg once a week for 28 days. Start taking on: February 15, 2022 What changed: See the new instructions.   Repatha SureClick [id number] MG/ML [place] Generic drug: Evolocumab INJECT 1 PEN SUBCUTANEOUSLY EVERY TWO WEEKS What changed: See the new instructions.   Se-Tan PLUS 162-115.2-1 MG Caps TAKE 1 CAPSULE BY MOUTH TWICE A DAY  What changed: when to take this   SYSTANE COMPLETE OP Place 1 drop into both eyes in the morning.   TRUEplus Lancets 30G Misc Check blood sugar once per day   Vitamin D-3 125 MCG (5000 UT) Tabs Take 5,000 Units by mouth daily.        Follow-up Information     Marin Olp, MD Follow up in 1 week(s).   Specialty: Family Medicine Contact information: [place] [id number] [phone]                  Time coordinating discharge: 39 minutes  Signed:  Felicite Zeimet  [facility] 03/01/2022, 4:01 PM

## 2022-03-02 LAB — GLUCOSE, CAPILLARY: Glucose-Capillary: 99 mg/dL (ref 70–99)

## 2022-03-02 NOTE — Progress Notes (Cosign Needed)
Discharge instructions were given to patient and pt.'s daughter at bedside with Caryl Pina RN and explained in detail. Questions/concerns were addressed accordingly. No further questions/concerns per pt. or daughter. Pt. and daughter verbalized full understanding of all instructions. All belongings were sent with patient. Pt. discharged in stable condition via wheelchair with daughter in car.

## 2022-03-02 NOTE — Progress Notes (Signed)
I attest to student documentation.  Ammie Ferrier, MSN-RN [place]

## 2022-03-03 ENCOUNTER — Other Ambulatory Visit: Payer: Self-pay | Admitting: Internal Medicine

## 2022-03-03 MED ORDER — OXYCODONE-ACETAMINOPHEN 5-325 MG PO TABS: 1.0000 | ORAL_TABLET | ORAL | 0 refills | Status: DC | PRN

## 2022-03-03 NOTE — Progress Notes (Unsigned)
Norco discontinued and oxycodone prescribed.

## 2022-03-04 ENCOUNTER — Telehealth: Payer: Self-pay

## 2022-03-04 ENCOUNTER — Encounter: Payer: Self-pay | Admitting: Family Medicine

## 2022-03-04 ENCOUNTER — Ambulatory Visit (INDEPENDENT_AMBULATORY_CARE_PROVIDER_SITE_OTHER): Payer: Medicare HMO | Admitting: Family Medicine

## 2022-03-04 VITALS — BP 140/80 | HR 68 | Temp 98.0°F | Ht 61.0 in | Wt 226.4 lb

## 2022-03-04 DIAGNOSIS — S065XAA Traumatic subdural hemorrhage with loss of consciousness status unknown, initial encounter: Secondary | ICD-10-CM

## 2022-03-04 DIAGNOSIS — I10 Essential (primary) hypertension: Secondary | ICD-10-CM

## 2022-03-04 DIAGNOSIS — Z79899 Other long term (current) drug therapy: Secondary | ICD-10-CM | POA: Diagnosis not present

## 2022-03-04 DIAGNOSIS — R0781 Pleurodynia: Secondary | ICD-10-CM | POA: Diagnosis not present

## 2022-03-04 DIAGNOSIS — M546 Pain in thoracic spine: Secondary | ICD-10-CM | POA: Diagnosis not present

## 2022-03-04 NOTE — Transitions of Care (Post Inpatient/ED Visit) (Signed)
   03/04/2022  Name: [name] MRN: [id number] DOB: [birth year]  Today's TOC FU Call Status: Today's TOC FU Call Status:: Successful TOC FU Call Competed Unsuccessful Call (1st Attempt) Date: 03/04/22 [facility] FU Call Complete Date: 03/04/22  Transition Care Management Follow-up Telephone Call Date of Discharge: 03/02/22 Discharge Facility: [facility] Type of Discharge: Inpatient Admission Primary Inpatient Discharge Diagnosis:: subdural hematoma How have you been since you were released from the hospital?: Same Any questions or concerns?: Yes Patient Questions/Concerns:: still having a headache Patient Questions/Concerns Addressed: Other: (plan to discuss with provider today at appt)  Items Reviewed: Did you receive and understand the discharge instructions provided?: Yes Medications obtained and verified?: Yes (Medications Reviewed) Any new allergies since your discharge?: No Dietary orders reviewed?: NA Do you have support at home?: Yes People in Home: child(ren), adult Name of Support/Comfort Primary Source: daughter [facility] and Equipment/Supplies: [place] Ordered?: Yes Name of [place]:: [place] set up a time to come to your home?: No EMR reviewed for [place] Orders: Orders present/patient has not received call (refer to CM for follow-up) (Pt has number to contact and prefers to call them, scheudled with RNCM on 03/07/22) Any new equipment or medical supplies ordered?: NA  Functional Questionnaire: Do you need assistance with bathing/showering or dressing?: No Do you need assistance with meal preparation?: No Do you need assistance with eating?: No Do you have difficulty maintaining continence: No Do you need assistance with getting out of bed/getting out of a chair/moving?: No Do you have difficulty managing or taking your medications?: No  Folllow up appointments reviewed: PCP Follow-up appointment confirmed?: Yes Date of  PCP follow-up appointment?: 03/04/22 Follow-up Provider: [facility] Follow-up appointment confirmed?: NA Do you need transportation to your follow-up appointment?: No Do you understand care options if your condition(s) worsen?: Yes-patient verbalized understanding  SDOH Interventions Today    Flowsheet Row Most Recent Value  SDOH Interventions   Food Insecurity Interventions Intervention Not Indicated  Transportation Interventions Intervention Not Indicated      Interventions Today    Flowsheet Row Most Recent Value  Chronic Disease Discussed/Reviewed   Chronic disease discussed/reviewed during today's visit Other  [subdural hematoma]  General Interventions   General Interventions Discussed/Reviewed General Interventions Discussed, Referral to Nurse       [facility] Interventions Today    Flowsheet Row Most Recent Value  TOC Interventions   TOC Interventions Discussed/Reviewed TOC Interventions Discussed      Scheduled for follow up with RNCM for care coordination on 03/07/22   SIGNATURE Peter Garter RN, Jackquline [place], [place] [facility] Coordinator [place] [facility] [phone]

## 2022-03-04 NOTE — Progress Notes (Signed)
Subjective:     Patient ID: [name], female    DOB: [birth year], 82 y.o.   MRN: [id number]  Chief Complaint  Patient presents with   [facility] follow-up for fall on 02/27/22, fell backwards, was unconscious for 6 minutes due to head trauma  Discuss meds: when to started back taking aspirin, can she take diclofenac with the tylenol and hydrocodone she has been taking for pain Having a lot of discomfort in back and abdomen     HPI-here w/Daughter [name].  Lives alone [place] f/u.  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  Pt was pulling back hand truck and wheels kept going and pt lost balance.  Fell backwards on 02/27/22.  LOC for 6 minutes. Was ok prior. Admitted 2/7 to 03/01/22 Subdural hematoma Per d/c summary:  past medical history of coronary artery disease, diabetes mellitus, hypertension, hypothyroidism, polymyalgia rheumatica, asthma presented to the hospital after sustaining a mechanical fall which was accidental after she tripped and fell on 02/27/22.  She complained of headache. In the ED, CT head scan showed left subdural hematoma, interhemispheric hematoma and right parietal scalp hematoma.   Neurosurg-no tx x keppra for 7 days  Ct head/spine report MPRESSION: CT HEAD: 1. Acute left convexity subdural hematoma, measuring up to 6 mm in thickness. No midline shift. 2. Acute, thin interhemispheric subdural hematoma, measuring up to 3 mm in thickness. 3. Large right parietal scalp hematoma. No underlying calvarial fracture. CT CERVICAL SPINE: 1. No acute fracture or traumatic listhesis. 2. Multilevel spondylosis with at least mild spinal canal stenosis at C4-5 and C5-6. Aortic Atherosclerosis (ICD10-I70.0)   Taking 2 vicodin5/325 and tylenol 538m every 6 hrs.    Has a lot of chest and back pain since the fall.  Severe and raw.  Taking diclofenac hard to sit in bed and pull self up.  No sob. Thoracic as well.    No bm-started 2 colace last pm.    Bp's elevated  so on Cleviprex drip in hosp and started on amlodipine(chronic)and hydralazine tid  Health Maintenance Due  Topic Date Due   Medicare Annual Wellness (AWV)  02/12/2022   FOOT EXAM  02/16/2022    Past Medical History:  Diagnosis Date   Anemia    Anxiety    Arthritis    osteoarthritis - shoulder, knees & hips   Asthma    Coronary artery disease    Depression    Diabetes mellitus without complication (HCC)    no meds   Fibromyalgia    Heart murmur    [place] ([place]    Hypertension    Dr. JOrinda Kennermanages BP, pt. reports MD has not found a need for treatment    Hypothyroidism    [place]    Sleep difficulties    had sleep study -2009, prior to gastric surgery, told that there was not a need for f/u   Thyroid disease    Vitamin B 12 deficiency     Past Surgical History:  Procedure Laterality Date   [place]    [facility]   CATARACT EXTRACTION, BILATERAL     late 2018   LEFT HEART CATH AND CORONARY ANGIOGRAPHY N/A 04/02/2018   Procedure: LEFT HEART CATH AND CORONARY ANGIOGRAPHY;  Surgeon: JMartinique Peter M, MD;  [facility];  Service: Cardiovascular;  Laterality: N/A;  OOPHORECTOMY     pantallor arthrodesis with rod placement left foot     PARATHYROIDECTOMY Left 03/16/2021   Procedure: LEFT INFERIOR PARATHYROIDECTOMY;  Surgeon: Armandina Gemma, MD;  Location: [facility];  Service: General;  Laterality: Left;   TONSILLECTOMY     TOTAL KNEE ARTHROPLASTY Left 11/30/2018   Procedure: TOTAL KNEE ARTHROPLASTY;  Surgeon: Gaynelle Arabian, MD;  Location: [facility];  Service: Orthopedics;  Laterality: Left;   TOTAL KNEE ARTHROPLASTY Right 06/28/2019   Procedure: TOTAL KNEE ARTHROPLASTY;  Surgeon: Gaynelle Arabian, MD;  Location: [facility];  Service: Orthopedics;  Laterality: Right;  22mn   TOTAL SHOULDER ARTHROPLASTY Left 03/12/2012   Procedure: TOTAL SHOULDER ARTHROPLASTY;   Surgeon: KMarin Shutter MD;  [place] [facility]  Service: Orthopedics;  Laterality: Left;    Outpatient Medications Prior to Visit  Medication Sig Dispense Refill   acetaminophen (TYLENOL) 500 MG tablet Take 1,000 mg by mouth at bedtime as needed for mild pain. Can take up to three times day     albuterol (VENTOLIN HFA) 108 (90 Base) MCG/ACT inhaler Inhale 1-2 puffs into the lungs every 4 (four) hours as needed for wheezing or shortness of breath. 18 g 2   amLODipine (NORVASC) 10 MG tablet TAKE 1 TABLET EVERY DAY 90 tablet 2   Biotin 5000 MCG CAPS Take 5,000 mcg by mouth daily.     Cholecalciferol (VITAMIN D-3) 125 MCG (5000 UT) TABS Take 5,000 Units by mouth daily.     cyanocobalamin (,VITAMIN B-12,) 1000 MCG/ML injection INJECT 1ML (1,000 MCG TOTAL)  INTO THE MUSCLE EVERY 30 DAYS USE ON A MONTHLY BASES 10 mL 0   desvenlafaxine (PRISTIQ) 100 MG 24 hr tablet TAKE 1 TABLET EVERY DAY (Patient taking differently: Take 100 mg by mouth daily.) 90 tablet 3   diclofenac (VOLTAREN) 75 MG EC tablet Take 1 tablet (75 mg total) by mouth 2 (two) times daily. 180 tablet 3   Evolocumab (REPATHA SURECLICK) 1XX123456MG/ML SOAJ INJECT 1 PEN SUBCUTANEOUSLY EVERY TWO WEEKS (Patient taking differently: Inject 140 mg into the skin every 14 (fourteen) days.) 6 mL 3   FeFum-FePo-FA-B Cmp-C-Zn-Mn-Cu (SE-TAN PLUS) 162-115.2-1 MG CAPS TAKE 1 CAPSULE BY MOUTH TWICE A DAY (Patient taking differently: Take 1 capsule by mouth daily.) 180 capsule 0   fexofenadine (ALLEGRA) 180 MG tablet Take 180 mg by mouth daily as needed for allergies or rhinitis.     fluticasone (FLONASE) 50 MCG/ACT nasal spray Place 1 spray into both nostrils daily.     glucose blood test strip Test blood sugar once per day 100 each 3   hydrALAZINE (APRESOLINE) 10 MG tablet Take 1 tablet (10 mg total) by mouth 3 (three) times daily. 90 tablet 2   HYDROcodone-acetaminophen (NORCO/VICODIN) 5-325 MG tablet Take 2 tablets by mouth every 6 (six) hours as needed for  moderate pain.     levETIRAcetam (KEPPRA) 500 MG tablet Take 1 tablet (500 mg total) by mouth 2 (two) times daily for 5 days. 10 tablet 0   Levothyroxine Sodium 150 MCG CAPS Take 150 mcg by mouth daily.     loperamide (IMODIUM) 2 MG capsule Take 4 mg by mouth daily as needed for diarrhea or loose stools.      nitroGLYCERIN (NITROSTAT) 0.4 MG SL tablet Place 1 tablet (0.4 mg total) under the tongue every 5 (five) minutes as needed for chest pain. 50 tablet 3   Omega-3 Fatty Acids (FISH OIL) 1000 MG CAPS Take 1,000 mg by mouth daily.  Propylene Glycol (SYSTANE COMPLETE OP) Place 1 drop into both eyes in the morning.     Semaglutide,0.25 or 0.5MG/DOS, (OZEMPIC, 0.25 OR 0.5 MG/DOSE,) 2 MG/3ML SOPN Inject 0.25 mg into the skin once a week for 28 days, THEN 0.5 mg once a week for 28 days. (Patient taking differently: Inject 0.25 mg into the skin once a week for 28 days, THEN 0.5 mg once a week for 28 days. Tuesday) 6 mL 0   TRUEplus Lancets 30G MISC Check blood sugar once per day 100 each 3   oxyCODONE-acetaminophen (PERCOCET/ROXICET) 5-325 MG tablet Take 1 tablet by mouth every 4 (four) hours as needed for severe pain. (Patient not taking: Reported on 03/04/2022) 20 tablet 0   No facility-administered medications prior to visit.    Allergies  Allergen Reactions   Flexeril [Cyclobenzaprine] Other (See Comments)    On Pristiq- possible serotonin reaction to flexeril can not take together   Ace Inhibitors Other (See Comments) and Swelling    States after a surgery she was told to "not take" this class of drugs- not 100% sure what the reason was Possible angioedema during/after general anesthesia States after a surgery she was told to "not take" this class of drugs- not 100% sure what the reason was States after a surgery she was told to "not take" this class of drugs- not 100% sure what the reason was    Codeine Nausea Only and Other (See Comments)    Upset stomach   Kiwi Extract Other (See  Comments)   Metoprolol     Edema, fatigue, headache, low HR into 40s   Azithromycin Rash   ROS neg/noncontributory except as noted HPI/below      Objective:     BP (!) 140/80   Pulse 68   Temp 98 F (36.7 C) (Temporal)   Ht 5' 1"$  (1.549 m)   Wt 226 lb 6 oz (102.7 kg)   SpO2 93%   BMI 42.77 kg/m  Wt Readings from Last 3 Encounters:  03/04/22 226 lb 6 oz (102.7 kg)  02/28/22 226 lb 3.1 oz (102.6 kg)  01/08/22 225 lb (102.1 kg)    Physical Exam   Gen: WDWN NAD HEENT:  conjunctiva not injected, sclera nonicteric NECK:  supple, no thyromegaly, no nodes, no carotid bruits CARDIAC: RRR, S1S2+, no murmur. DP 2+B LUNGS: CTAB. No wheezes ABDOMEN:  BS+, soft, NTND, No HSM, no masses MSK: walker.  No TTP spine.  She does have some tenderness right posterior lower ribs.  Anteriorly-has some tenderness to palpation along multiple ribs bilaterally.  No bruising on trunk.  Head: Large right parietal/occipital scalp hematoma.  A lot of bruising on the right side of her head/neck-some purple, some starting to turn green. NEURO: A&O x3.  CN II-XII intact.  PSYCH: normal mood. Good eye contact. Tired appearing  Spent 1 hour with patient and daughter reviewing hospitalization, issues occurring now, discussing medications, things to watch out for.  Safety of medications.     Assessment & Plan:   Problem List Items Addressed This Visit       Cardiovascular and Mediastinum   Essential hypertension   Other Visit Diagnoses     SDH (subdural hematoma) ([place])    -  Primary   High risk medication use       Relevant Orders   CBC with Differential/Platelet   Comprehensive metabolic panel   Magnesium   Rib pain       Relevant Orders   DG Ribs  Bilateral W/Chest   Thoracic spine pain       Relevant Medications   HYDROcodone-acetaminophen (NORCO/VICODIN) 5-325 MG tablet   Other Relevant Orders   DG Thoracic Spine W/Swimmers     1.  Subdural hematoma-new diagnosis.  Status post fall.   Appears to be stable.  Monitor closely.  Any mental status changes, strokelike symptoms-head to the emergency room.  Patient taking Vicodin 5/325-2 tablets 3 times daily.  Also using 500 mg Tylenol with this.  Was using diclofenac 75 mg tablets-advised not to take NSAIDs due to the bleed and making it worse.  Also advised, that she is at the top dose of Tylenol per dose and daily.  Needs to be careful with her meds.  Daughter and other family members are currently staying with her. 2.  Rib pain-multiple.  Status post fall.  Not sure if fractures, or a lot of soft tissue.  Advised to go to [place] office tomorrow for x-ray. 3.  Thoracic spine pain-more laterally and possibly more over the ribs.  However, she did fall flat on her back with loss of consciousness.  Will check thoracic spine films 4.  Hypertension-blood pressure was very elevated after the fall.  She is chronically on amlodipine 10 mg daily-continue this.  Hydralazine 10 mg 3 times daily was added to her regimen status post hospitalization.  Continue for now.  Monitor blood pressures daily.  If they are dropping less than the 130s over 80s, let us know and we will start to wean.  Follow-up with Dr. Yong Channel in 3 to 4 weeks.  Sooner if other complications.  Check CBC, BMP, magnesium per discharge summary  No orders of the defined types were placed in this encounter.   Wellington Hampshire, MD

## 2022-03-04 NOTE — Patient Instructions (Addendum)
Miralax and 2 colace/day.  get X-ray/labs at [facility].  [place]  hours 8=M-F 8:30-5.  closed 12:30-1 lunch   No Aspirin nor diclofenac  Monitor bp's daily and message [place] if going high/low

## 2022-03-04 NOTE — Transitions of Care (Post Inpatient/ED Visit) (Deleted)
   03/04/2022  Name: [name] MRN: [id number] DOB: 1940-09-18  Today's TOC FU Call Status: Today's TOC FU Call Status:: Unsuccessul Call (1st Attempt) Unsuccessful Call (1st Attempt) Date: 03/04/22  Attempted to reach the patient regarding the most recent Inpatient/ED visit.  Follow Up Plan: Additional outreach attempts will be made to reach the patient to complete the Transitions of Care (Post Inpatient/ED visit) call.   Signature Peter Garter RN, Jackquline Denmark, [facility] Management [phone]

## 2022-03-05 ENCOUNTER — Telehealth: Payer: Self-pay | Admitting: Family Medicine

## 2022-03-05 DIAGNOSIS — S065XAD Traumatic subdural hemorrhage with loss of consciousness status unknown, subsequent encounter: Secondary | ICD-10-CM | POA: Diagnosis not present

## 2022-03-05 DIAGNOSIS — E1169 Type 2 diabetes mellitus with other specified complication: Secondary | ICD-10-CM | POA: Diagnosis not present

## 2022-03-05 DIAGNOSIS — M797 Fibromyalgia: Secondary | ICD-10-CM | POA: Diagnosis not present

## 2022-03-05 DIAGNOSIS — I2510 Atherosclerotic heart disease of native coronary artery without angina pectoris: Secondary | ICD-10-CM | POA: Diagnosis not present

## 2022-03-05 DIAGNOSIS — D649 Anemia, unspecified: Secondary | ICD-10-CM | POA: Diagnosis not present

## 2022-03-05 DIAGNOSIS — E785 Hyperlipidemia, unspecified: Secondary | ICD-10-CM | POA: Diagnosis not present

## 2022-03-05 DIAGNOSIS — I700 Atherosclerosis of aorta: Secondary | ICD-10-CM | POA: Diagnosis not present

## 2022-03-05 DIAGNOSIS — J45909 Unspecified asthma, uncomplicated: Secondary | ICD-10-CM | POA: Diagnosis not present

## 2022-03-05 DIAGNOSIS — I119 Hypertensive heart disease without heart failure: Secondary | ICD-10-CM | POA: Diagnosis not present

## 2022-03-05 LAB — COMPREHENSIVE METABOLIC PANEL
ALT: 22 U/L (ref 0–35)
AST: 51 U/L — ABNORMAL HIGH (ref 0–37)
Albumin: 3.7 g/dL (ref 3.5–5.2)
Alkaline Phosphatase: 71 U/L (ref 39–117)
BUN: 14 mg/dL (ref 6–23)
CO2: 25 mEq/L (ref 19–32)
Calcium: 9.3 mg/dL (ref 8.4–10.5)
Chloride: 97 mEq/L (ref 96–112)
Creatinine, Ser: 1.01 mg/dL (ref 0.40–1.20)
GFR: 52.04 mL/min — ABNORMAL LOW (ref >60.00)
Glucose, Bld: 128 mg/dL — ABNORMAL HIGH (ref 70–99)
Potassium: 4 mEq/L (ref 3.5–5.1)
Sodium: 134 mEq/L — ABNORMAL LOW (ref 135–145)
Total Bilirubin: 0.7 mg/dL (ref 0.2–1.2)
Total Protein: 6.2 g/dL (ref 6.0–8.3)

## 2022-03-05 LAB — CBC WITH DIFFERENTIAL/PLATELET
Basophils Absolute: 0.1 10*3/uL (ref 0.0–0.1)
Basophils Relative: 1 % (ref 0.0–3.0)
Eosinophils Absolute: 0.6 10*3/uL (ref 0.0–0.7)
Eosinophils Relative: 6.9 % — ABNORMAL HIGH (ref 0.0–5.0)
HCT: 38.5 % (ref 36.0–46.0)
Hemoglobin: 13 g/dL (ref 12.0–15.0)
Lymphocytes Relative: 22.8 % (ref 12.0–46.0)
Lymphs Abs: 2 10*3/uL (ref 0.7–4.0)
MCHC: 33.9 g/dL (ref 30.0–36.0)
MCV: 88.8 fl (ref 78.0–100.0)
Monocytes Absolute: 0.8 10*3/uL (ref 0.1–1.0)
Monocytes Relative: 9.7 % (ref 3.0–12.0)
Neutro Abs: 5.2 10*3/uL (ref 1.4–7.7)
Neutrophils Relative %: 59.6 % (ref 43.0–77.0)
Platelets: 251 10*3/uL (ref 150.0–400.0)
RBC: 4.34 Mil/uL (ref 3.87–5.11)
RDW: 13.2 % (ref 11.5–15.5)
WBC: 8.7 10*3/uL (ref 4.0–10.5)

## 2022-03-05 LAB — MAGNESIUM: Magnesium: 1.9 mg/dL (ref 1.5–2.5)

## 2022-03-05 NOTE — Telephone Encounter (Signed)
Home Health Verbal Orders  Agency:  [facility]  Caller: Research officer, political party and title  Requesting OT/ PT/ Skilled nursing/ Social Work/ Speech:    Reason for Request:  PT - twice a week for 4 weeks then 1 a week for 2 weeks   Frequency:    HH needs F2F w/in last 30 days     [phone]

## 2022-03-05 NOTE — Telephone Encounter (Signed)
Returned Betsy's call and VO given.

## 2022-03-05 NOTE — Progress Notes (Signed)
Liver test slightly elevated-could be the tylenol or just the fatty liver.  They were supposed to make appt w/Dr. Yong Channel for 3 wk f/u-so please get that sch and he will determine when to f/u on the labs

## 2022-03-07 ENCOUNTER — Ambulatory Visit: Payer: Self-pay

## 2022-03-07 DIAGNOSIS — E1169 Type 2 diabetes mellitus with other specified complication: Secondary | ICD-10-CM | POA: Diagnosis not present

## 2022-03-07 DIAGNOSIS — E785 Hyperlipidemia, unspecified: Secondary | ICD-10-CM | POA: Diagnosis not present

## 2022-03-07 DIAGNOSIS — M797 Fibromyalgia: Secondary | ICD-10-CM | POA: Diagnosis not present

## 2022-03-07 DIAGNOSIS — I700 Atherosclerosis of aorta: Secondary | ICD-10-CM | POA: Diagnosis not present

## 2022-03-07 DIAGNOSIS — I2510 Atherosclerotic heart disease of native coronary artery without angina pectoris: Secondary | ICD-10-CM | POA: Diagnosis not present

## 2022-03-07 DIAGNOSIS — D649 Anemia, unspecified: Secondary | ICD-10-CM | POA: Diagnosis not present

## 2022-03-07 DIAGNOSIS — I119 Hypertensive heart disease without heart failure: Secondary | ICD-10-CM | POA: Diagnosis not present

## 2022-03-07 DIAGNOSIS — S065XAD Traumatic subdural hemorrhage with loss of consciousness status unknown, subsequent encounter: Secondary | ICD-10-CM | POA: Diagnosis not present

## 2022-03-07 DIAGNOSIS — J45909 Unspecified asthma, uncomplicated: Secondary | ICD-10-CM | POA: Diagnosis not present

## 2022-03-07 NOTE — Patient Outreach (Signed)
  Care Coordination   Initial Visit Note   03/07/2022 Name: [name] MRN: [id number] DOB: December 24, 1940  [name] is a 82 y.o. year old female who sees Yong Channel, Brayton Mars, MD for primary care. I spoke with  [name] by phone today. Patient lives alone but daughters are with her for now.  Patient able to completes ADL's and uses rollator for ambulation.  Active with home health PT.   What matters to the patients health and wellness today?  Getting better and back to herself    Goals Addressed             This Visit's Progress    To get back to her normal self       Patient Goals/Self Care Activities: -Patient/Caregiver will self-administer medications as prescribed as evidenced by self-report/primary caregiver report  -Patient/Caregiver will attend all scheduled provider appointments as evidenced by clinician review of documented attendance to scheduled appointments and patient/caregiver report -Patient/Caregiver will call provider office for new concerns or questions as evidenced by review of documented incoming telephone call notes and patient report  -Calls provider office for new concerns, questions, or BP outside discussed parameters -Checks BP and records as discussed -Follows a low sodium diet/DASH diet  Interventions Today    Flowsheet Row Most Recent Value  Chronic Disease   Chronic disease during today's visit Hypertension (HTN)  General Interventions   General Interventions Discussed/Reviewed General Interventions Reviewed, Doctor Visits  Doctor Visits Discussed/Reviewed Doctor Visits Discussed  Exercise Interventions   Exercise Discussed/Reviewed Exercise Discussed  Education Interventions   Education Provided Provided Education  Provided Verbal Education On Other  [blood pressure monitoring]             SDOH assessments and interventions completed:  Yes  SDOH Interventions Today    Flowsheet Row Most Recent Value  SDOH  Interventions   Food Insecurity Interventions Intervention Not Indicated  Housing Interventions Intervention Not Indicated        Care Coordination Interventions:  Yes, provided   Follow up plan: Follow up call scheduled for 03/21/22    Encounter Outcome:  Pt. Visit Completed   Jone Baseman, RN, MSN [facility] Care Management Coordinator Direct Line [phone]

## 2022-03-07 NOTE — Patient Instructions (Signed)
Visit Information  Thank you for taking time to visit with me today. Please don't hesitate to contact me if I can be of assistance to you.   Following are the goals we discussed today:   Goals Addressed             This Visit's Progress    To get back to her normal self       Patient Goals/Self Care Activities: -Patient/Caregiver will self-administer medications as prescribed as evidenced by self-report/primary caregiver report  -Patient/Caregiver will attend all scheduled provider appointments as evidenced by clinician review of documented attendance to scheduled appointments and patient/caregiver report -Patient/Caregiver will call provider office for new concerns or questions as evidenced by review of documented incoming telephone call notes and patient report  -Calls provider office for new concerns, questions, or BP outside discussed parameters -Checks BP and records as discussed -Follows a low sodium diet/DASH diet  Interventions Today    Flowsheet Row Most Recent Value  Chronic Disease   Chronic disease during today's visit Hypertension (HTN)  General Interventions   General Interventions Discussed/Reviewed General Interventions Reviewed, Doctor Visits  Doctor Visits Discussed/Reviewed Doctor Visits Discussed  Exercise Interventions   Exercise Discussed/Reviewed Exercise Discussed  Education Interventions   Education Provided Provided Education  Provided Verbal Education On Other  [blood pressure monitoring]             Our next appointment is by telephone on 03/21/22 at 1100  Please call the care guide team at [phone] if you need to cancel or reschedule your appointment.   If you are experiencing a Mental Health or [place] or need someone to talk to, please call the Suicide and Crisis Lifeline: 988   Patient verbalizes understanding of instructions and care plan provided today and agrees to view in [place]. Active MyChart status and patient  understanding of how to access instructions and care plan via MyChart confirmed with patient.     The patient has been provided with contact information for the care management team and has been advised to call with any health related questions or concerns.   Jone Baseman, RN, MSN [facility] Care Management Coordinator Direct Line [phone]

## 2022-03-08 ENCOUNTER — Encounter: Payer: Self-pay | Admitting: Neurology

## 2022-03-08 ENCOUNTER — Telehealth: Payer: Self-pay | Admitting: Family Medicine

## 2022-03-08 ENCOUNTER — Encounter: Payer: Self-pay | Admitting: Family Medicine

## 2022-03-08 ENCOUNTER — Other Ambulatory Visit: Payer: Self-pay

## 2022-03-08 DIAGNOSIS — R519 Headache, unspecified: Secondary | ICD-10-CM

## 2022-03-08 NOTE — Telephone Encounter (Signed)
.  Home Health Certification or Plan of Care Tracking  Is this a Certification or Plan of Care? yes  [facility] Agency: [facility]ft  Order Number:  [id number]  Has charge sheet been attached? yes  Where has form been placed:  In provider's box  Faxed to:   [phone]

## 2022-03-08 NOTE — Telephone Encounter (Signed)
Form has been faxed.

## 2022-03-11 DIAGNOSIS — I2510 Atherosclerotic heart disease of native coronary artery without angina pectoris: Secondary | ICD-10-CM | POA: Diagnosis not present

## 2022-03-11 DIAGNOSIS — S065XAD Traumatic subdural hemorrhage with loss of consciousness status unknown, subsequent encounter: Secondary | ICD-10-CM | POA: Diagnosis not present

## 2022-03-11 DIAGNOSIS — I700 Atherosclerosis of aorta: Secondary | ICD-10-CM | POA: Diagnosis not present

## 2022-03-11 DIAGNOSIS — D649 Anemia, unspecified: Secondary | ICD-10-CM | POA: Diagnosis not present

## 2022-03-11 DIAGNOSIS — I119 Hypertensive heart disease without heart failure: Secondary | ICD-10-CM | POA: Diagnosis not present

## 2022-03-11 DIAGNOSIS — J45909 Unspecified asthma, uncomplicated: Secondary | ICD-10-CM | POA: Diagnosis not present

## 2022-03-11 DIAGNOSIS — E1169 Type 2 diabetes mellitus with other specified complication: Secondary | ICD-10-CM | POA: Diagnosis not present

## 2022-03-11 DIAGNOSIS — M797 Fibromyalgia: Secondary | ICD-10-CM | POA: Diagnosis not present

## 2022-03-11 DIAGNOSIS — E785 Hyperlipidemia, unspecified: Secondary | ICD-10-CM | POA: Diagnosis not present

## 2022-03-11 NOTE — Progress Notes (Unsigned)
Initial neurology clinic note  [name] MRN: [id number] DOB: [birth year]  Referring provider: Marin Olp, MD  Primary care provider: Marin Olp, MD  Reason for consult:  headaches  Subjective:  This is [name], a 82 y.o. right-handed female with a medical history of DM, degenerative lumbar disease s/p surgery, HTN, CAD, hypothyroidism, polymyalgia rheumatica, depression, and asthma who presents to neurology clinic with headaches. The patient is accompanied by daughter and son-in-law.  Patient is having a headache on the right back part of her head and extending down into her neck. She does have some neck pain on her head. Patient had a fall after she tripped on 02/27/22. She had a headache afterward. Patient presented to the ED and CT head showed a left subdural hematoma, interhemispheric hematoma, and right parietal scalp hematoma. There was no midline shift. Repeat scan at 6 hours was stable. NSGY did not recommend surgical intervention. Patient was started on keppra for 7 days per NSGY. Since discharge, patient continues to have headaches. She rates the pain 5/10 currently. She is taking Norco 1-2 per day and 500 mg tylenol. It takes about 1.5 for pain to improve, but does help for several hours.  Patient walks with a walker or cane at baseline since bilateral knee surgeries (3-4 years ago was most recent). She has chronic back pain and imbalance, but feels it may have been worse since her fall.  She is regaining independence and mood has improved since the initial fall. She is doing home PT twice per week.  Of note, patient is on desvenlafaxine 100 mg daily for depression. Patient was also previously on aspirin 81 mg daily for "preventative reasons". This was stopped after her SDH.  MEDICATIONS:  Outpatient Encounter Medications as of 03/13/2022  Medication Sig   acetaminophen (TYLENOL) 500 MG tablet Take 1,000 mg by mouth at bedtime as needed for  mild pain. Can take up to three times day   albuterol (VENTOLIN HFA) 108 (90 Base) MCG/ACT inhaler Inhale 1-2 puffs into the lungs every 4 (four) hours as needed for wheezing or shortness of breath.   amLODipine (NORVASC) 10 MG tablet TAKE 1 TABLET EVERY DAY   Biotin 5000 MCG CAPS Take 5,000 mcg by mouth daily.   Cholecalciferol (VITAMIN D-3) 125 MCG (5000 UT) TABS Take 5,000 Units by mouth daily.   desvenlafaxine (PRISTIQ) 100 MG 24 hr tablet TAKE 1 TABLET EVERY DAY   Evolocumab (REPATHA SURECLICK) [id number] MG/ML SOAJ INJECT 1 PEN SUBCUTANEOUSLY EVERY TWO WEEKS (Patient taking differently: Inject 140 mg into the skin every 14 (fourteen) days.)   FeFum-FePo-FA-B Cmp-C-Zn-Mn-Cu (SE-TAN PLUS) 162-115.2-1 MG CAPS TAKE 1 CAPSULE BY MOUTH TWICE A DAY (Patient taking differently: Take 1 capsule by mouth daily.)   fexofenadine (ALLEGRA) 180 MG tablet Take 180 mg by mouth daily as needed for allergies or rhinitis.   fluticasone (FLONASE) 50 MCG/ACT nasal spray Place 1 spray into both nostrils daily.   glucose blood test strip Test blood sugar once per day   hydrALAZINE (APRESOLINE) 10 MG tablet Take 1 tablet (10 mg total) by mouth 3 (three) times daily.   HYDROcodone-acetaminophen (NORCO/VICODIN) 5-325 MG tablet Take 2 tablets by mouth every 6 (six) hours as needed for moderate pain.   Levothyroxine Sodium 150 MCG CAPS Take 150 mcg by mouth daily.   loperamide (IMODIUM) 2 MG capsule Take 4 mg by mouth daily as needed for diarrhea or loose stools.    Omega-3 Fatty Acids (FISH  OIL) 1000 MG CAPS Take 1,000 mg by mouth daily.   Propylene Glycol (SYSTANE COMPLETE OP) Place 1 drop into both eyes in the morning.   Semaglutide,0.25 or 0.5MG/DOS, (OZEMPIC, 0.25 OR 0.5 MG/DOSE,) 2 MG/3ML SOPN Inject 0.25 mg into the skin once a week for 28 days, THEN 0.5 mg once a week for 28 days. (Patient taking differently: Inject 0.25 mg into the skin once a week for 28 days, THEN 0.5 mg once a week for 28 days. Tuesday)   TRUEplus  Lancets 30G MISC Check blood sugar once per day   cyanocobalamin (,VITAMIN B-12,) 1000 MCG/ML injection INJECT 1ML (1,000 MCG TOTAL)  INTO THE MUSCLE EVERY 30 DAYS USE ON A MONTHLY BASES (Patient not taking: Reported on 03/07/2022)   diclofenac (VOLTAREN) 75 MG EC tablet Take 1 tablet (75 mg total) by mouth 2 (two) times daily. (Patient not taking: Reported on 03/07/2022)   levETIRAcetam (KEPPRA) 500 MG tablet Take 1 tablet (500 mg total) by mouth 2 (two) times daily for 5 days.   nitroGLYCERIN (NITROSTAT) 0.4 MG SL tablet Place 1 tablet (0.4 mg total) under the tongue every 5 (five) minutes as needed for chest pain. (Patient not taking: Reported on 03/13/2022)   No facility-administered encounter medications on file as of 03/13/2022.    PAST MEDICAL HISTORY: Past Medical History:  Diagnosis Date   Anemia    Anxiety    Arthritis    osteoarthritis - shoulder, knees & hips   Asthma    Coronary artery disease    Depression    Diabetes mellitus without complication (HCC)    no meds   Fibromyalgia    Heart murmur    Hyperparathyroidism ([place])    Hypertension    Dr. Orinda Kenner manages BP, pt. reports MD has not found a need for treatment    Hypothyroidism    Polymyalgia rheumatica ([place])    Sleep difficulties    had sleep study -2009, prior to gastric surgery, told that there was not a need for f/u   Thyroid disease    Vitamin B 12 deficiency     PAST SURGICAL HISTORY: Past Surgical History:  Procedure Laterality Date   ABDOMINAL HYSTERECTOMY     BARIATRIC SURGERY     CARDIAC CATHETERIZATION     [facility]- 30 yrs. ago   CATARACT EXTRACTION, BILATERAL     late 2018   LEFT HEART CATH AND CORONARY ANGIOGRAPHY N/A 04/02/2018   Procedure: LEFT HEART CATH AND CORONARY ANGIOGRAPHY;  Surgeon: Martinique, Peter M, MD;  Location: [facility];  Service: Cardiovascular;  Laterality: N/A;   OOPHORECTOMY     pantallor arthrodesis with rod placement left foot     PARATHYROIDECTOMY Left 03/16/2021    Procedure: LEFT INFERIOR PARATHYROIDECTOMY;  Surgeon: Armandina Gemma, MD;  Location: [facility];  Service: General;  Laterality: Left;   TONSILLECTOMY     TOTAL KNEE ARTHROPLASTY Left 11/30/2018   Procedure: TOTAL KNEE ARTHROPLASTY;  Surgeon: Gaynelle Arabian, MD;  Location: [facility];  Service: Orthopedics;  Laterality: Left;   TOTAL KNEE ARTHROPLASTY Right 06/28/2019   Procedure: TOTAL KNEE ARTHROPLASTY;  Surgeon: Gaynelle Arabian, MD;  Location: [facility];  Service: Orthopedics;  Laterality: Right;  86mn   TOTAL SHOULDER ARTHROPLASTY Left 03/12/2012   Procedure: TOTAL SHOULDER ARTHROPLASTY;  Surgeon: KMarin Shutter MD;  [facility]  Service: Orthopedics;  Laterality: Left;    ALLERGIES: Allergies  Allergen Reactions   Flexeril [Cyclobenzaprine] Other (See Comments)    On Pristiq-  possible serotonin reaction to flexeril can not take together   Ace Inhibitors Other (See Comments) and Swelling    States after a surgery she was told to "not take" this class of drugs- not 100% sure what the reason was Possible angioedema during/after general anesthesia States after a surgery she was told to "not take" this class of drugs- not 100% sure what the reason was States after a surgery she was told to "not take" this class of drugs- not 100% sure what the reason was    Codeine Nausea Only and Other (See Comments)    Upset stomach   Kiwi Extract Other (See Comments)   Metoprolol     Edema, fatigue, headache, low HR into 40s   Azithromycin Rash    FAMILY HISTORY: Family History  Problem Relation Age of Onset   Stroke Mother    Liver cancer Father    Cancer Father        liver   Breast cancer Neg Hx     SOCIAL HISTORY: Social History   Tobacco Use   Smoking status: Former    Packs/day: 0.70    Years: 20.00    Total pack years: 14.00    Types: Cigarettes    Quit date: 03/04/1978    Years since quitting: 44.0   Smokeless tobacco: Never  Vaping Use   Vaping Use: Never used  Substance Use  Topics   Alcohol use: Yes    Alcohol/week: 2.0 standard drinks of alcohol    Types: 2 Glasses of wine per week    Comment: with dinner  or social rare -    Drug use: No   Social History   Social History Narrative   Widowed 2013. 2 kids. 4 grandkids. Oldest daughter lives in [place] and has 2 grandkids that are now in Scientist, physiological, PT school at [employer]       Retired-RN in operating room at [employer]: play cards with group, active with [employer] ([employer] [address])   Does some dog sitting at her home   Live alone one    Objective:  Vital Signs:  BP (!) 147/81   Pulse 74   Ht 5' 1"$  (1.549 m)   Wt 224 lb (101.6 kg)   SpO2 95%   BMI 42.32 kg/m   General: No acute distress.  Patient appears well-groomed.   Head:  Scalp/neck bruising on right Neck: supple, no paraspinal tenderness, full range of motion  Neurological Exam: Mental status: alert and oriented, speech fluent and not dysarthric, language intact.  Cranial nerves: CN I: not tested CN II: pupils equal, round and reactive to light, visual fields intact CN III, IV, VI:  full range of motion, no nystagmus, no ptosis CN V: facial sensation intact. CN VII: upper and lower face symmetric CN VIII: hearing intact CN IX, X: gag intact, uvula midline CN XI: sternocleidomastoid and trapezius muscles intact CN XII: tongue midline  Bulk & Tone: normal, no fasciculations. Motor:  muscle strength 5/5 throughout Deep Tendon Reflexes:  Intact and symmetric throughout.   Sensation:  Sensation intact to light touch. Gait:  Antalgic gait with walker.  Labs and Imaging review: Internal labs: Lab Results  Component Value Date   HGBA1C 6.7 (H) 01/08/2022   Lab Results  Component Value Date   [id number] 01/08/2022   Lab Results  Component Value Date   TSH 0.33 (L) 02/19/2021   Lab Results  Component Value Date  ESRSEDRATE 32 (H) 08/10/2019   Magnesium (03/04/22): 1.9   External  labs: TSH (10/30/21): 2.79 Vit D (10/22/21): 38  Imaging: CT head and cervical spine wo contrast (02/27/22): FINDINGS: CT HEAD FINDINGS   Brain: Acute, thin interhemispheric subdural hematoma, measuring up to 3 mm in thickness along the left anterior aspect of the falx (image 9 series 3). Acute left convexity subdural hematoma, measuring up to 6 mm along the inferior left frontal lobe (coronal image 24 series 5). No midline shift. Severe, confluence hypoattenuation of the cerebral white matter, most consistent with chronic small-vessel disease. Cortical gray-white differentiation is preserved. No hydrocephalus. Basilar cisterns are patent.   Vascular: No hyperdense vessel or unexpected calcification.   Skull: No calvarial fracture or suspicious bone lesion. Skull base is unremarkable.   Sinuses/Orbits: Paranasal sinuses, mastoid air cells, and middle ear cavities are well aerated. Orbits are unremarkable.   Other: Large right parietal scalp hematoma.   CT CERVICAL SPINE FINDINGS   Alignment: Degenerative reversal of the normal cervical lordosis. 3 mm degenerative anterolisthesis of C4 on C5 with associated facet arthropathy.   Skull base and vertebrae: No acute fracture. Normal craniocervical junction. Diffusely demineralized appearance of the bones.   Soft tissues and spinal canal: No prevertebral fluid or swelling. No visible canal hematoma.   Disc levels: Multilevel spondylosis with at least mild spinal canal stenosis at C4-5 and C5-6.   Upper chest: Atherosclerotic calcifications of the aortic arch and arch vessel origins.   Other: None.   IMPRESSION: CT HEAD:   1. Acute left convexity subdural hematoma, measuring up to 6 mm in thickness. No midline shift. 2. Acute, thin interhemispheric subdural hematoma, measuring up to 3 mm in thickness. 3. Large right parietal scalp hematoma. No underlying calvarial fracture.   CT CERVICAL SPINE:   1. No acute fracture  or traumatic listhesis. 2. Multilevel spondylosis with at least mild spinal canal stenosis at C4-5 and C5-6.   Aortic Atherosclerosis (ICD10-I70.0).  Repeat CT head at 6 hours (02/27/22): FINDINGS: Brain: Small falcine subdural hematoma is again identified measuring up to 2 mm in thickness, without associated mass effect. The left-sided subdural hematoma seen previously is again noted, without appreciable change. This measures up to 5 mm in maximal thickness reference image 26/4, previously measuring 6 mm in a similar location. No mass effect or midline shift.   There are no new areas of intracranial hemorrhage. No evidence of acute infarct. Stable chronic small-vessel ischemic changes throughout the white matter and basal ganglia. Lateral ventricles and remaining midline structures are unremarkable.   Vascular: No hyperdense vessel or unexpected calcification. Stable atherosclerosis.   Skull: Large right parieto-occipital scalp hematoma has increased since prior study. No underlying fracture. The remainder of the calvarium is unremarkable.   Sinuses/Orbits: No acute finding.   Other: None.   IMPRESSION: 1. Grossly stable falcine and left-sided subdural hematomas as above, without mass effect or midline shift. 2. No new areas of hemorrhage. 3. Stable chronic ischemic changes.  No acute infarct. 4. Increased size of the right parietooccipital scalp hematoma seen previously. No underlying fracture.  Assessment/Plan:  [name] is a 82 y.o. female who presents for evaluation of post traumatic headache after fall and sub dural hematoma. She has a relevant medical history of DM, degenerative lumbar disease s/p surgery, HTN, CAD, hypothyroidism, polymyalgia rheumatica, depression, and asthma. her neurological examination is pertinent for antalgic gait from chronic back pain, but otherwise intact with no focal neurologic deficits. The etiology of patient's  head pain is likely  a combination of the SDH and scalp hematoma. She appears to be improving already as she is needed less Norco for pain control. In terms of expected time to heal, I estimated 3-6 months to patient, though she could have residual headaches going forward. I do expect her symptoms to be much improved over the coming months though. I would not add more medication at this time as this might cause more imbalance and increase chances of another fall.  PLAN: -Continue PT  -Okay to continue tylenol and Norco as needed -No objection to aspirin 81 mg daily, but no clear indication per patient, so would not restart if not needed -Over the counter options for headache given  -Return to clinic in 6 months  The impression above as well as the plan as outlined below were extensively discussed with the patient (in the company of daughter and son-in-law) who voiced understanding. All questions were answered to their satisfaction.  When available, results of the above investigations and possible further recommendations will be communicated to the patient via telephone/MyChart. Patient to call office if not contacted after expected testing turnaround time.   Total time spent reviewing records, interview, history/exam, documentation, and coordination of care on day of encounter:  55 min   Thank you for allowing me to participate in patient's care.  If I can answer any additional questions, I would be pleased to do so.  Kai Levins, MD   CC: Yong Channel [address] [place], [place] [address]  CC: Referring provider: Marin Olp, [place],  [place] [address]

## 2022-03-12 ENCOUNTER — Ambulatory Visit (INDEPENDENT_AMBULATORY_CARE_PROVIDER_SITE_OTHER)
Admission: RE | Admit: 2022-03-12 | Discharge: 2022-03-12 | Disposition: A | Discharge status: Home / Self Care | Payer: Medicare HMO | Source: Ambulatory Visit | Attending: Family Medicine | Admitting: Family Medicine

## 2022-03-12 DIAGNOSIS — M546 Pain in thoracic spine: Secondary | ICD-10-CM | POA: Diagnosis not present

## 2022-03-12 DIAGNOSIS — R0781 Pleurodynia: Secondary | ICD-10-CM

## 2022-03-12 DIAGNOSIS — I700 Atherosclerosis of aorta: Secondary | ICD-10-CM | POA: Diagnosis not present

## 2022-03-12 DIAGNOSIS — S2241XA Multiple fractures of ribs, right side, initial encounter for closed fracture: Secondary | ICD-10-CM | POA: Diagnosis not present

## 2022-03-12 DIAGNOSIS — I771 Stricture of artery: Secondary | ICD-10-CM | POA: Diagnosis not present

## 2022-03-13 ENCOUNTER — Encounter: Payer: Self-pay | Admitting: Neurology

## 2022-03-13 ENCOUNTER — Ambulatory Visit: Payer: Medicare HMO | Admitting: Neurology

## 2022-03-13 VITALS — BP 147/81 | HR 74 | Ht 61.0 in | Wt 224.0 lb

## 2022-03-13 DIAGNOSIS — S0003XS Contusion of scalp, sequela: Secondary | ICD-10-CM | POA: Diagnosis not present

## 2022-03-13 DIAGNOSIS — G44309 Post-traumatic headache, unspecified, not intractable: Secondary | ICD-10-CM

## 2022-03-13 DIAGNOSIS — S065XAA Traumatic subdural hemorrhage with loss of consciousness status unknown, initial encounter: Secondary | ICD-10-CM | POA: Diagnosis not present

## 2022-03-13 DIAGNOSIS — W19XXXS Unspecified fall, sequela: Secondary | ICD-10-CM | POA: Diagnosis not present

## 2022-03-13 NOTE — Patient Instructions (Signed)
I saw you today for headaches after your fall and subdural hematoma.  I expect you to continue to improve over the next few months. I would continue tylenol and Norco as needed.  Continue physical therapy.  I would like to see you again in 6 months or sooner if needed.  Vitamins and herbs that show potential for preventing headaches:  Magnesium: Magnesium (250 mg twice a day or 500 mg at bed) has a relaxant effect on smooth muscles such as blood vessels. Individuals suffering from frequent or daily headache usually have low magnesium levels which can be increase with daily supplementation of 400-750 mg. Three trials found 40-90% average headache reduction when used as a preventative. Magnesium also demonstrated the benefit in menstrually related migraine. Magnesium is part of the messenger system in the serotonin cascade and it is a good muscle relaxant. It is also useful for constipation which can be a side effect of other medications used to treat migraine. Good sources include nuts, whole grains, and tomatoes.  Riboflavin (vitamin B 2) 200 mg twice a day. This vitamin assists nerve cells in the production of ATP a principal energy storing molecule. It is necessary for many chemical reactions in the body. There have been at least 3 clinical trials of riboflavin using 400 mg per day all of which suggested that migraine frequency can be decreased. All 3 trials showed significant improvement in over half of migraine sufferers. The supplement is found in bread, cereal, milk, meat, and poultry. Most Americans get more riboflavin than the recommended daily allowance, however riboflavin deficiency is not necessary for the supplements to help prevent headache.  Feverfew: Feverfew is a common garden herb native to [place] and popular in [place] as a treatment for disorders typically controlled by aspirin. The mechanism of action is unknown but is believed to be related to a chemical called parthenolide  which helps the body use serotonin more effectively. Serotonin helps prevent migraine and assists with resolution when it occurs. Parthenolide also inhibits the release of histamine which is linked to pain and inflammation.  Consistency of active ingredients in different products can be a problem. Some formulations don't have the active ingredient (parthenolide) that prevents migraine. A parthenolide content of 0.2% is generally recommended. Typical dosage is one capsule 3 times a day.  Coenzyme Q10: This is present in almost all cells in the body and is critical component for the conversion of energy. Recent studies have shown that a nutritional supplement of CoQ10 can reduce the frequency of migraine attacks by improving the energy production of cells as with riboflavin. Doses of 150 mg twice a day have been shown to be effective.  Melatonin: Increasing evidence shows correlation between melatonin secretion and headache conditions. Melatonin supplementation has decreased headache intensity and duration. It is widely used as a sleep aid. Sleep is natures way of dealing with migraine. A dose of 3 mg is recommended to start for headaches including cluster headache. Higher doses up to 15 mg has been reviewed for use in Cluster headache and have been used. The rationale behind using melatonin for cluster is that many theories regarding the cause of Cluster headache center around the disruption of the normal circadian rhythm in the brain. This helps restore the normal circadian rhythm.  Ginger: Ginger has a small amount of antihistamine and anti-inflammatory action which may help headache. It is primarily used for nausea and may aid in the absorption of other medications.   The physicians and staff at  [facility] are committed to providing excellent care. You may receive a survey requesting feedback about your experience at our office. We strive to receive "very good" responses to the survey questions.  If you feel that your experience would prevent you from giving the office a "very good " response, please contact our office to try to remedy the situation. We may be reached at [phone]. Thank you for taking the time out of your busy day to complete the survey.  Kai Levins, MD [facility]

## 2022-03-14 ENCOUNTER — Encounter: Payer: Self-pay | Admitting: Family Medicine

## 2022-03-14 DIAGNOSIS — I700 Atherosclerosis of aorta: Secondary | ICD-10-CM | POA: Diagnosis not present

## 2022-03-14 DIAGNOSIS — I119 Hypertensive heart disease without heart failure: Secondary | ICD-10-CM | POA: Diagnosis not present

## 2022-03-14 DIAGNOSIS — E1169 Type 2 diabetes mellitus with other specified complication: Secondary | ICD-10-CM | POA: Diagnosis not present

## 2022-03-14 DIAGNOSIS — I2510 Atherosclerotic heart disease of native coronary artery without angina pectoris: Secondary | ICD-10-CM | POA: Diagnosis not present

## 2022-03-14 DIAGNOSIS — J45909 Unspecified asthma, uncomplicated: Secondary | ICD-10-CM | POA: Diagnosis not present

## 2022-03-14 DIAGNOSIS — S065XAD Traumatic subdural hemorrhage with loss of consciousness status unknown, subsequent encounter: Secondary | ICD-10-CM | POA: Diagnosis not present

## 2022-03-14 DIAGNOSIS — D649 Anemia, unspecified: Secondary | ICD-10-CM | POA: Diagnosis not present

## 2022-03-14 DIAGNOSIS — E785 Hyperlipidemia, unspecified: Secondary | ICD-10-CM | POA: Diagnosis not present

## 2022-03-14 DIAGNOSIS — M797 Fibromyalgia: Secondary | ICD-10-CM | POA: Diagnosis not present

## 2022-03-14 NOTE — Telephone Encounter (Signed)
Caller states: - [name] is currently complaining of back pain being a 7-8/10  - [name] wanted to know if she could restart diclofenac 75 mg BID  - [name] stopped taking following hospital d/c on 02/27/22  Steffanie Dunn can be reached at [phone]  Please Advise.

## 2022-03-18 DIAGNOSIS — I2510 Atherosclerotic heart disease of native coronary artery without angina pectoris: Secondary | ICD-10-CM | POA: Diagnosis not present

## 2022-03-18 DIAGNOSIS — J45909 Unspecified asthma, uncomplicated: Secondary | ICD-10-CM | POA: Diagnosis not present

## 2022-03-18 DIAGNOSIS — I119 Hypertensive heart disease without heart failure: Secondary | ICD-10-CM | POA: Diagnosis not present

## 2022-03-18 DIAGNOSIS — I700 Atherosclerosis of aorta: Secondary | ICD-10-CM | POA: Diagnosis not present

## 2022-03-18 DIAGNOSIS — S065XAD Traumatic subdural hemorrhage with loss of consciousness status unknown, subsequent encounter: Secondary | ICD-10-CM | POA: Diagnosis not present

## 2022-03-18 DIAGNOSIS — E1169 Type 2 diabetes mellitus with other specified complication: Secondary | ICD-10-CM | POA: Diagnosis not present

## 2022-03-18 DIAGNOSIS — D649 Anemia, unspecified: Secondary | ICD-10-CM | POA: Diagnosis not present

## 2022-03-18 DIAGNOSIS — M797 Fibromyalgia: Secondary | ICD-10-CM | POA: Diagnosis not present

## 2022-03-18 DIAGNOSIS — E785 Hyperlipidemia, unspecified: Secondary | ICD-10-CM | POA: Diagnosis not present

## 2022-03-20 DIAGNOSIS — I119 Hypertensive heart disease without heart failure: Secondary | ICD-10-CM | POA: Diagnosis not present

## 2022-03-20 DIAGNOSIS — I2510 Atherosclerotic heart disease of native coronary artery without angina pectoris: Secondary | ICD-10-CM | POA: Diagnosis not present

## 2022-03-20 DIAGNOSIS — D649 Anemia, unspecified: Secondary | ICD-10-CM | POA: Diagnosis not present

## 2022-03-20 DIAGNOSIS — M797 Fibromyalgia: Secondary | ICD-10-CM | POA: Diagnosis not present

## 2022-03-20 DIAGNOSIS — J45909 Unspecified asthma, uncomplicated: Secondary | ICD-10-CM | POA: Diagnosis not present

## 2022-03-20 DIAGNOSIS — S065XAD Traumatic subdural hemorrhage with loss of consciousness status unknown, subsequent encounter: Secondary | ICD-10-CM | POA: Diagnosis not present

## 2022-03-20 DIAGNOSIS — E1169 Type 2 diabetes mellitus with other specified complication: Secondary | ICD-10-CM | POA: Diagnosis not present

## 2022-03-20 DIAGNOSIS — I700 Atherosclerosis of aorta: Secondary | ICD-10-CM | POA: Diagnosis not present

## 2022-03-20 DIAGNOSIS — E785 Hyperlipidemia, unspecified: Secondary | ICD-10-CM | POA: Diagnosis not present

## 2022-03-21 ENCOUNTER — Ambulatory Visit: Payer: Self-pay

## 2022-03-21 NOTE — Patient Instructions (Signed)
Visit Information  Thank you for taking time to visit with me today. Please don't hesitate to contact me if I can be of assistance to you.   Following are the goals we discussed today:   Goals Addressed             This Visit's Progress    To get back to her normal self       Patient Goals/Self Care Activities: -Patient/Caregiver will self-administer medications as prescribed as evidenced by self-report/primary caregiver report  -Patient/Caregiver will attend all scheduled provider appointments as evidenced by clinician review of documented attendance to scheduled appointments and patient/caregiver report -Patient/Caregiver will call provider office for new concerns or questions as evidenced by review of documented incoming telephone call notes and patient report  -Calls provider office for new concerns, questions, or BP outside discussed parameters -Checks BP and records as discussed -Follows a low sodium diet/DASH diet  Patient to see PCP on 03-26-22 Interventions Today    Flowsheet Row Most Recent Value  Chronic Disease   Chronic disease during today's visit Hypertension (HTN)  General Interventions   General Interventions Discussed/Reviewed Doctor Visits  Doctor Visits Discussed/Reviewed Doctor Visits Discussed  Exercise Interventions   Exercise Discussed/Reviewed Exercise Discussed  Education Interventions   Education Provided Provided Education  [blood pressure]  Provided Verbal Education On Other  [blood pressure]  Safety Interventions   Safety Discussed/Reviewed Safety Discussed             Our next appointment is by telephone on 04/11/22 at 1100  Please call the care guide team at [phone] if you need to cancel or reschedule your appointment.   If you are experiencing a Mental Health or [place] or need someone to talk to, please call the Suicide and Crisis Lifeline: 988   Patient verbalizes understanding of instructions and care plan  provided today and agrees to view in [place]. Active MyChart status and patient understanding of how to access instructions and care plan via MyChart confirmed with patient.     The patient has been provided with contact information for the care management team and has been advised to call with any health related questions or concerns.   Jone Baseman, RN, MSN [facility] Care Management Coordinator Direct Line [phone]

## 2022-03-21 NOTE — Patient Outreach (Signed)
  Care Coordination   Follow Up Visit Note   03/21/2022 Name: [name] MRN: [id number] DOB: 24-Aug-1940  [name] is a 82 y.o. year old female who sees Yong Channel, Brayton [place], MD for primary care. I spoke with  [name] by phone today.  What matters to the patients health and wellness today?  Continue to get back to normal self.     Goals Addressed             This Visit's Progress    To get back to her normal self       Patient Goals/Self Care Activities: -Patient/Caregiver will self-administer medications as prescribed as evidenced by self-report/primary caregiver report  -Patient/Caregiver will attend all scheduled provider appointments as evidenced by clinician review of documented attendance to scheduled appointments and patient/caregiver report -Patient/Caregiver will call provider office for new concerns or questions as evidenced by review of documented incoming telephone call notes and patient report  -Calls provider office for new concerns, questions, or BP outside discussed parameters -Checks BP and records as discussed -Follows a low sodium diet/DASH diet  Patient to see PCP on 03-26-22 Interventions Today    Flowsheet Row Most Recent Value  Chronic Disease   Chronic disease during today's visit Hypertension (HTN)  General Interventions   General Interventions Discussed/Reviewed Doctor Visits  Doctor Visits Discussed/Reviewed Doctor Visits Discussed  Exercise Interventions   Exercise Discussed/Reviewed Exercise Discussed  Education Interventions   Education Provided Provided Education  [blood pressure]  Provided Verbal Education On Other  [blood pressure]  Safety Interventions   Safety Discussed/Reviewed Safety Discussed             SDOH assessments and interventions completed:  Yes     Care Coordination Interventions:  Yes, provided   Follow up plan: Follow up call scheduled for March    Encounter Outcome:  Pt. Visit Completed    Jone Baseman, RN, MSN [place] [facility] Care Management Coordinator Direct Line [phone]

## 2022-03-22 DIAGNOSIS — M5136 Other intervertebral disc degeneration, lumbar region: Secondary | ICD-10-CM | POA: Diagnosis not present

## 2022-03-22 DIAGNOSIS — Z6841 Body Mass Index (BMI) 40.0 and over, adult: Secondary | ICD-10-CM | POA: Diagnosis not present

## 2022-03-22 DIAGNOSIS — M1991 Primary osteoarthritis, unspecified site: Secondary | ICD-10-CM | POA: Diagnosis not present

## 2022-03-22 DIAGNOSIS — M65331 Trigger finger, right middle finger: Secondary | ICD-10-CM | POA: Diagnosis not present

## 2022-03-22 DIAGNOSIS — Z7952 Long term (current) use of systemic steroids: Secondary | ICD-10-CM | POA: Diagnosis not present

## 2022-03-22 DIAGNOSIS — M353 Polymyalgia rheumatica: Secondary | ICD-10-CM | POA: Diagnosis not present

## 2022-03-26 ENCOUNTER — Encounter: Payer: Self-pay | Admitting: Family Medicine

## 2022-03-26 ENCOUNTER — Ambulatory Visit (INDEPENDENT_AMBULATORY_CARE_PROVIDER_SITE_OTHER): Payer: Medicare HMO | Admitting: Family Medicine

## 2022-03-26 VITALS — BP 117/75 | HR 73 | Temp 97.9°F | Ht 61.0 in | Wt 218.0 lb

## 2022-03-26 DIAGNOSIS — I700 Atherosclerosis of aorta: Secondary | ICD-10-CM | POA: Diagnosis not present

## 2022-03-26 DIAGNOSIS — G720 Drug-induced myopathy: Secondary | ICD-10-CM | POA: Diagnosis not present

## 2022-03-26 DIAGNOSIS — E785 Hyperlipidemia, unspecified: Secondary | ICD-10-CM | POA: Diagnosis not present

## 2022-03-26 DIAGNOSIS — E119 Type 2 diabetes mellitus without complications: Secondary | ICD-10-CM

## 2022-03-26 DIAGNOSIS — E1169 Type 2 diabetes mellitus with other specified complication: Secondary | ICD-10-CM

## 2022-03-26 DIAGNOSIS — I10 Essential (primary) hypertension: Secondary | ICD-10-CM | POA: Diagnosis not present

## 2022-03-26 DIAGNOSIS — E039 Hypothyroidism, unspecified: Secondary | ICD-10-CM | POA: Diagnosis not present

## 2022-03-26 MED ORDER — HYDROCODONE-ACETAMINOPHEN 5-325 MG PO TABS: 2.0000 | ORAL_TABLET | Freq: Four times a day (QID) | ORAL | 0 refills | Status: DC | PRN

## 2022-03-26 NOTE — Patient Instructions (Addendum)
We honestly have a ton of good cardiologists- I think you may like Dr. Johney Frame, Dr. Ellyn Hack, Dr. Sallyanne Kuster, Dr. Angelena Form, Dr. Harrington Challenger, Dr. Marisue Ivan  -I agree with having some sort of monitor on you at all times- whether life alert or apple watch or whatever you and your family decide on  I am so pleasantly surprised by how much progress you have made! Keep up the good work- keep pushing forward but also listening to your body and resting when you need it  Recommended follow up: Return for next already scheduled visit or sooner if needed.

## 2022-03-26 NOTE — Progress Notes (Signed)
Phone [phone] In person visit   Subjective:   [name] is a 82 y.o. year old very pleasant female patient who presents for/with See problem oriented charting Chief Complaint  Patient presents with   Follow-up    Pt states she is doing well after her fall.   Past Medical History-  Patient Active Problem List   Diagnosis Date Noted   Subdural hematoma ([place]) 02/27/2022    Priority: High   Hyperparathyroidism, primary ([place]) 03/14/2021    Priority: High   Aortic atherosclerosis ([place]) 02/16/2021    Priority: Medium    Osteoporosis 10/29/2020    Priority: High   History of polymyalgia rheumatica 03/16/2018    Priority: High   CAD (coronary artery disease) 11/19/2017    Priority: High   Hypercalcemia 12/29/2014    Priority: High   OA (osteoarthritis) of knee 10/05/2013    Priority: High   Well controlled type 2 diabetes mellitus ([place]) 07/18/2006    Priority: High   Mild aortic stenosis 11/16/2020    Priority: Medium    B12 deficiency 05/31/2019    Priority: Medium    Overactive bladder 09/03/2016    Priority: Medium    Morbid obesity ([place]) 06/28/2015    Priority: Medium    History of gastric bypass 02/08/2014    Priority: Medium    Hyperlipidemia associated with type 2 diabetes mellitus ([place]) 01/05/2008    Priority: Medium    Hypothyroidism 09/09/2007    Priority: Medium    Major depression, recurrent, full remission ([place]) 07/18/2006    Priority: Medium    Essential hypertension 07/18/2006    Priority: Medium    Asthma 07/18/2006    Priority: Medium    Primary osteoarthritis of right knee 06/28/2019    Priority: Low   Allergic rhinitis 07/20/2014    Priority: Low   CHEST WALL PAIN, ANTERIOR 10/09/2007    Priority: Low    Medications- reviewed and updated Current Outpatient Medications  Medication Sig Dispense Refill   albuterol (VENTOLIN HFA) 108 (90 Base) MCG/ACT inhaler Inhale 1-2 puffs into the lungs every 4 (four) hours as needed for  wheezing or shortness of breath. 18 g 2   amLODipine (NORVASC) 10 MG tablet TAKE 1 TABLET EVERY DAY 90 tablet 2   Biotin 5000 MCG CAPS Take 5,000 mcg by mouth daily.     Cholecalciferol (VITAMIN D-3) 125 MCG (5000 UT) TABS Take 5,000 Units by mouth daily.     cyanocobalamin (,VITAMIN B-12,) 1000 MCG/ML injection INJECT 1ML (1,000 MCG TOTAL)  INTO THE MUSCLE EVERY 30 DAYS USE ON A MONTHLY BASES 10 mL 0   desvenlafaxine (PRISTIQ) 100 MG 24 hr tablet TAKE 1 TABLET EVERY DAY 90 tablet 3   diclofenac (VOLTAREN) 75 MG EC tablet Take 1 tablet (75 mg total) by mouth 2 (two) times daily. 180 tablet 3   Evolocumab (REPATHA SURECLICK) XX123456 MG/ML SOAJ INJECT 1 PEN SUBCUTANEOUSLY EVERY TWO WEEKS (Patient taking differently: Inject 140 mg into the skin every 14 (fourteen) days.) 6 mL 3   FeFum-FePo-FA-B Cmp-C-Zn-Mn-Cu (SE-TAN PLUS) 162-115.2-1 MG CAPS TAKE 1 CAPSULE BY MOUTH TWICE A DAY (Patient taking differently: Take 1 capsule by mouth daily.) 180 capsule 0   fexofenadine (ALLEGRA) 180 MG tablet Take 180 mg by mouth daily as needed for allergies or rhinitis.     fluticasone (FLONASE) 50 MCG/ACT nasal spray Place 1 spray into both nostrils daily.     glucose blood test strip Test blood sugar once per day 100 each  3   Levothyroxine Sodium 150 MCG CAPS Take 150 mcg by mouth daily.     loperamide (IMODIUM) 2 MG capsule Take 4 mg by mouth daily as needed for diarrhea or loose stools.      nitroGLYCERIN (NITROSTAT) 0.4 MG SL tablet Place 1 tablet (0.4 mg total) under the tongue every 5 (five) minutes as needed for chest pain. 50 tablet 3   Omega-3 Fatty Acids (FISH OIL) 1000 MG CAPS Take 1,000 mg by mouth daily.     Propylene Glycol (SYSTANE COMPLETE OP) Place 1 drop into both eyes in the morning.     Semaglutide,0.25 or 0.'5MG'$ /DOS, (OZEMPIC, 0.25 OR 0.5 MG/DOSE,) 2 MG/3ML SOPN Inject 0.25 mg into the skin once a week for 28 days, THEN 0.5 mg once a week for 28 days. (Patient taking differently: Inject 0.25 mg into  the skin once a week for 28 days, THEN 0.5 mg once a week for 28 days. Tuesday) 6 mL 0   TRUEplus Lancets 30G MISC Check blood sugar once per day 100 each 3   HYDROcodone-acetaminophen (NORCO/VICODIN) 5-325 MG tablet Take 2 tablets by mouth every 6 (six) hours as needed for moderate pain. 30 tablet 0   No current facility-administered medications for this visit.     Objective:  BP 117/75   Pulse 73   Temp 97.9 F (36.6 C)   Ht '5\' 1"'$  (1.549 m)   Wt 218 lb (98.9 kg)   SpO2 96%   BMI 41.19 kg/m  Gen: NAD, resting comfortably CV: RRR no murmurs rubs or gallops Lungs: CTAB no crackles, wheeze, rhonchi Ext: no edema Skin: warm, dry     Assessment and Plan   # Subdural hematoma follow-up S: Patient hospitalized from February 27, 2022 to February 2024 after she had a mechanical fall and developed significant headache-CT scan of her head showed left subdural hematoma, interhemispheric hematoma and right parietal scalp hematoma-thankfully showed stability on follow-up CT scan.  She was started on Keppra 5 mg twice daily for total of 7 days.  Patient saw Dr. Cherlynn Kaiser for hospital follow-up.  01/10/2023-patient with thoracic spine pain and headaches-hydrocodone was refilled.  Had been stopped on diclofenac due to bleed-previously very helpful for her joints.  She also had rib pain in multiple levels so we will send for rib films and's thoracic spine film- did have minimally displaced rib fractures. Headaches are getting shorter and shorter thankfully. The hydrocodone is more for ongoing  low back pain issues occurred even before this but simply worse with this.   Saw Dr. Berdine Addison on 03/13/22- was told 3-6 months to heal completely- was advised to continue PT. Was advised not to restart aspirin and they discussed otc options for headache - but was ok with norco as needed for now A/P: very reassuring workup with neurology- essentially can be monitored outpatient and has 6 month follow up planned with Dr.  Berdine Addison- we are available if needed  -I'm ok with hydrocodone for now and tylenol arthritis- but likely wait 3-6 months on restarting diclofenac- also has CKD III so have to be cautious   - 1st week has been by herself- daughters have stayed - doing home health PT at the house- but has graduated- doing home exercise now -I agree with having some sort of monitor on you at all times- whether life alert or apple watch or whatever you and your family decide on  # Diabetes S: Medication: Ozempic 0.25 mg weekly on Tuesday- plans to start 0.5  mg next week Lab Results  Component Value Date   HGBA1C 6.7 (H) 01/08/2022   HGBA1C 6.4 06/11/2021   HGBA1C 6.7 (H) 02/19/2021   A/P: too soon for repeat- likely check next visit   #hypertension S: medication: amlodipine 10 mg -able to come off of  hydralazine 10 mg 3 times a day Home readings #s: reports last blood pressure at home was 117/75 BP Readings from Last 3 Encounters:  03/26/22 117/75  03/13/22 (!) 147/81  03/04/22 (!) 140/80  A/P: stable- continue current medicines   #coronary artery calcium #hyperlipidemia #aortic atherosclerosis S: Medication:Repatha 140 mg every 2 weeks, off asa for now Lab Results  Component Value Date   CHOL 155 01/08/2022   HDL 59.90 01/08/2022   LDLCALC 62 01/08/2022   LDLDIRECT 60.0 05/31/2019   TRIG 76 03/01/2022   CHOLHDL 3 01/08/2022   A/P: lipids at goal recently oon repatha '140mg'$  q2 weeks- continue current medications Aortic atherosclerosis (presumed stable)- LDL goal ideally <70- repatha working well -statin myalgia noted   #hypothyroidism follows with [facility] DR. Grandville Silos  # Depression S: Medication:pristiq '100mg'$  XR     03/26/2022   10:59 AM 03/07/2022   11:07 AM 03/04/2022    5:07 PM  Depression screen PHQ 2/9  Decreased Interest 0 0 0  Down, Depressed, Hopeless 1 0 0  PHQ - 2 Score 1 0 0  Altered sleeping 2  1  Tired, decreased energy 3  3  Change in appetite 0  0  Feeling bad or  failure about yourself  0  0  Trouble concentrating 3  0  Moving slowly or fidgety/restless 3  0  Suicidal thoughts 0  0  PHQ-9 Score 12  4  Difficult doing work/chores Somewhat difficult  Very difficult  A/P: scores slightly higher due to situational trauma- monitor- suspect will improve as we get further out from this incident  Recommended follow up: Return for next already scheduled visit or sooner if needed. Future Appointments  Date Time Provider [place]  04/11/2022 11:00 AM Jon Billings, RN [facility] None  07/11/2022  9:00 AM Marin Olp, MD [facility]  09/11/2022 10:30 AM Shellia Carwin, MD [facility] None   Lab/Order associations:   ICD-10-CM   1. Well controlled type 2 diabetes mellitus ([place])  E11.9     2. Essential hypertension  I10     3. Hypothyroidism, unspecified type  E03.9     4. Drug-induced myopathy  G72.0     5. Hyperlipidemia associated with type 2 diabetes mellitus ([facility]) Chronic E11.69    E78.5     6. Aortic atherosclerosis ([facility]) Chronic I70.0       Meds ordered this encounter  Medications   HYDROcodone-acetaminophen (NORCO/VICODIN) 5-325 MG tablet    Sig: Take 2 tablets by mouth every 6 (six) hours as needed for moderate pain.    Dispense:  30 tablet    Refill:  0    Return precautions advised.  Garret Reddish, MD

## 2022-04-09 ENCOUNTER — Encounter: Payer: Self-pay | Admitting: Family Medicine

## 2022-04-09 ENCOUNTER — Telehealth: Payer: Self-pay

## 2022-04-09 MED ORDER — OZEMPIC (0.25 OR 0.5 MG/DOSE) 2 MG/3ML ~~LOC~~ SOPN: 0.5000 mg | PEN_INJECTOR | SUBCUTANEOUS | 2 refills | Status: AC

## 2022-04-09 NOTE — Patient Outreach (Addendum)
  Care Coordination   Case closure  Visit Note   04/09/2022 Name: [name] MRN: [id number] DOB: December 05, 1940  [name] is a 82 y.o. year old female who sees Yong Channel, Brayton Mars, MD for primary care. I  was notified that patient no longer wanted calls. CM closing case.   What matters to the patients health and wellness today?  N/A    Goals Addressed             This Visit's Progress    COMPLETED: To get back to her normal self       Patient request no further phone calls.  RN CM closing case  Patient Goals/Self Care Activities: -Patient/Caregiver will self-administer medications as prescribed as evidenced by self-report/primary caregiver report  -Patient/Caregiver will attend all scheduled provider appointments as evidenced by clinician review of documented attendance to scheduled appointments and patient/caregiver report -Patient/Caregiver will call provider office for new concerns or questions as evidenced by review of documented incoming telephone call notes and patient report  -Calls provider office for new concerns, questions, or BP outside discussed parameters -Checks BP and records as discussed -Follows a low sodium diet/DASH diet  Patient to see PCP on 03-26-22 Interventions Today    Flowsheet Row Most Recent Value  Chronic Disease   Chronic disease during today's visit Hypertension (HTN)  General Interventions   General Interventions Discussed/Reviewed Doctor Visits  Doctor Visits Discussed/Reviewed Doctor Visits Discussed  Exercise Interventions   Exercise Discussed/Reviewed Exercise Discussed  Education Interventions   Education Provided Provided Education  [blood pressure]  Provided Verbal Education On Other  [blood pressure]  Safety Interventions   Safety Discussed/Reviewed Safety Discussed             SDOH assessments and interventions completed:  No     Care Coordination Interventions:  No, not indicated   Follow up plan: No further  intervention required.   Encounter Outcome:  RN CM closing case.  Jone Baseman, RN, MSN [place] [facility] Care Management Coordinator Direct Line [phone]

## 2022-04-09 NOTE — Patient Outreach (Signed)
[name] called to cancel her appointment with Jon Billings, Nurse Case Manager and not reschedule.  She feels that she is doing good and she doesn't need our services any longer.    [place] [facility], [place], [place] [facility] [phone]

## 2022-04-16 NOTE — Telephone Encounter (Signed)
FYI

## 2022-04-17 ENCOUNTER — Telehealth: Payer: Self-pay | Admitting: Pharmacist

## 2022-04-17 NOTE — Progress Notes (Unsigned)
Care Management & Coordination Services Pharmacy Team  Reason for Encounter: General adherence update   Contacted patient for general health update and medication adherence call.  {US HC Outreach:28874}   What concerns do you have about your medications?  The patient {denies/reports:25180} side effects with their medications.   How often do you forget or accidentally miss a dose? {missed doses:25554}  Do you use a pillbox? {yes/no:20286}  Are you having any problems getting your medications from your pharmacy? {yes/no:20286}  Has the cost of your medications been a concern? {yes/no:20286} If yes, what medication and is patient assistance available or has it been applied for?  Since last visit with PharmD, {no/thefollowing:25210} interventions have been made.   The patient {has/has not:25209} had an ED visit since last contact.   The patient {denies/reports:25180} problems with their health.   Patient {denies/reports:25180} concerns or questions for ***, PharmD at this time.   Counseled patient on: {GENERALCOUNSELING:28686}   Chart Updates:  Recent office visits:  03/25/2021 OV (PCP) Traci Olp, MD; no medication changes indicated.  03/04/2022 OV (Fam Med) Traci Crook, MD;  Hypertension-blood pressure was very elevated after the fall.  She is chronically on amlodipine 10 mg daily-continue this.  Hydralazine 10 mg 3 times daily was added to her regimen status post hospitalization.  Continue for now.  Monitor blood pressures daily.  If they are dropping less than the 130s over 80s, let us know and we will start to wean.   Recent consult visits:  03/13/2021 OV (Neurology) Traci Carwin, MD; -No objection to aspirin 81 mg daily, but no clear indication per patient, so would not restart if not needed   Hospital visits:  02/27/2022 ED to Hospital Admission due to SDH Has been started on amlodipine, hydralazine.  Will continue hydralazine on discharge.  Blood pressure  has improved at this time.   Medications: Outpatient Encounter Medications as of 04/17/2022  Medication Sig   albuterol (VENTOLIN HFA) 108 (90 Base) MCG/ACT inhaler Inhale 1-2 puffs into the lungs every 4 (four) hours as needed for wheezing or shortness of breath.   amLODipine (NORVASC) 10 MG tablet TAKE 1 TABLET EVERY DAY   Biotin 5000 MCG CAPS Take 5,000 mcg by mouth daily.   Cholecalciferol (VITAMIN D-3) 125 MCG (5000 UT) TABS Take 5,000 Units by mouth daily.   cyanocobalamin (,VITAMIN B-12,) 1000 MCG/ML injection INJECT 1ML (1,000 MCG TOTAL)  INTO THE MUSCLE EVERY 30 DAYS USE ON A MONTHLY BASES   desvenlafaxine (PRISTIQ) 100 MG 24 hr tablet TAKE 1 TABLET EVERY DAY   diclofenac (VOLTAREN) 75 MG EC tablet Take 1 tablet (75 mg total) by mouth 2 (two) times daily.   Evolocumab (REPATHA SURECLICK) XX123456 MG/ML SOAJ INJECT 1 PEN SUBCUTANEOUSLY EVERY TWO WEEKS (Patient taking differently: Inject 140 mg into the skin every 14 (fourteen) days.)   FeFum-FePo-FA-B Cmp-C-Zn-Mn-Cu (SE-TAN PLUS) 162-115.2-1 MG CAPS TAKE 1 CAPSULE BY MOUTH TWICE A DAY (Patient taking differently: Take 1 capsule by mouth daily.)   fexofenadine (ALLEGRA) 180 MG tablet Take 180 mg by mouth daily as needed for allergies or rhinitis.   fluticasone (FLONASE) 50 MCG/ACT nasal spray Place 1 spray into both nostrils daily.   glucose blood test strip Test blood sugar once per day   HYDROcodone-acetaminophen (NORCO/VICODIN) 5-325 MG tablet Take 2 tablets by mouth every 6 (six) hours as needed for moderate pain.   Levothyroxine Sodium 150 MCG CAPS Take 150 mcg by mouth daily.   loperamide (IMODIUM) 2 MG capsule  Take 4 mg by mouth daily as needed for diarrhea or loose stools.    nitroGLYCERIN (NITROSTAT) 0.4 MG SL tablet Place 1 tablet (0.4 mg total) under the tongue every 5 (five) minutes as needed for chest pain.   Omega-3 Fatty Acids (FISH OIL) 1000 MG CAPS Take 1,000 mg by mouth daily.   Propylene Glycol (SYSTANE COMPLETE OP) Place 1  drop into both eyes in the morning.   Semaglutide,0.25 or 0.5MG /DOS, (OZEMPIC, 0.25 OR 0.5 MG/DOSE,) 2 MG/3ML SOPN Inject 0.5 mg into the skin once a week for 28 days.   TRUEplus Lancets 30G MISC Check blood sugar once per day   No facility-administered encounter medications on file as of 04/17/2022.    Recent vitals BP Readings from Last 3 Encounters:  03/26/22 117/75  03/13/22 (!) 147/81  03/04/22 (!) 140/80   Pulse Readings from Last 3 Encounters:  03/26/22 73  03/13/22 74  03/04/22 68   Wt Readings from Last 3 Encounters:  03/26/22 218 lb (98.9 kg)  03/13/22 224 lb (101.6 kg)  03/04/22 226 lb 6 oz (102.7 kg)   BMI Readings from Last 3 Encounters:  03/26/22 41.19 kg/m  03/13/22 42.32 kg/m  03/04/22 42.77 kg/m    Recent lab results    Component Value Date/Time   NA 134 (L) 03/04/2022 1653   NA 135 (A) 05/30/2020 0000   K 4.0 03/04/2022 1653   CL 97 03/04/2022 1653   CO2 25 03/04/2022 1653   GLUCOSE 128 (H) 03/04/2022 1653   BUN 14 03/04/2022 1653   BUN 15 05/30/2020 0000   CREATININE 1.01 03/04/2022 1653   CREATININE 0.86 10/19/2019 1134   CALCIUM 9.3 03/04/2022 1653    Lab Results  Component Value Date   CREATININE 1.01 03/04/2022   GFR 52.04 (L) 03/04/2022   GFRNONAA 59 (L) 02/28/2022   GFRAA 74 10/19/2019   Lab Results  Component Value Date/Time   HGBA1C 6.7 (H) 01/08/2022 03:09 PM   HGBA1C 6.4 06/11/2021 10:57 AM   MICROALBUR <0.7 06/11/2021 10:57 AM   MICROALBUR 2.1 (H) 07/11/2020 10:35 AM    Lab Results  Component Value Date   CHOL 155 01/08/2022   HDL 59.90 01/08/2022   LDLCALC 62 01/08/2022   LDLDIRECT 60.0 05/31/2019   TRIG 76 03/01/2022   CHOLHDL 3 01/08/2022    Care Gaps: Annual wellness visit in last year? No  If Diabetic: Last eye exam / retinopathy screening: 06/11/2021 Last diabetic foot exam: 02/16/2021 Last UACR: 06/11/2021  Star Rating Drugs:  Ozempic last filled 04/02/2022 28 DS  Future Appointments  Date Time Provider  [place]  07/11/2022  9:00 AM Traci Olp, MD [facility]  09/11/2022 10:30 AM Traci Carwin, MD [facility] None  10/18/2022  9:00 AM Traci Bergeron, MD [facility]   April D Calhoun, [place] Pharmacist Assistant [phone]

## 2022-04-24 ENCOUNTER — Other Ambulatory Visit: Payer: Self-pay | Admitting: Family Medicine

## 2022-04-24 MED ORDER — DICLOFENAC SODIUM 75 MG PO TBEC: 75.0000 mg | DELAYED_RELEASE_TABLET | Freq: Two times a day (BID) | ORAL | 3 refills | Status: DC

## 2022-04-25 DIAGNOSIS — E210 Primary hyperparathyroidism: Secondary | ICD-10-CM | POA: Diagnosis not present

## 2022-04-25 DIAGNOSIS — M810 Age-related osteoporosis without current pathological fracture: Secondary | ICD-10-CM | POA: Diagnosis not present

## 2022-04-25 DIAGNOSIS — E063 Autoimmune thyroiditis: Secondary | ICD-10-CM | POA: Diagnosis not present

## 2022-04-25 DIAGNOSIS — E038 Other specified hypothyroidism: Secondary | ICD-10-CM | POA: Diagnosis not present

## 2022-04-25 DIAGNOSIS — E559 Vitamin D deficiency, unspecified: Secondary | ICD-10-CM | POA: Diagnosis not present

## 2022-04-25 DIAGNOSIS — R748 Abnormal levels of other serum enzymes: Secondary | ICD-10-CM | POA: Diagnosis not present

## 2022-04-26 ENCOUNTER — Encounter: Payer: Self-pay | Admitting: Family Medicine

## 2022-04-29 ENCOUNTER — Other Ambulatory Visit: Payer: Self-pay

## 2022-04-29 ENCOUNTER — Other Ambulatory Visit (HOSPITAL_COMMUNITY): Payer: Self-pay

## 2022-04-29 MED ORDER — DICLOFENAC SODIUM 75 MG PO TBEC
75.0000 mg | DELAYED_RELEASE_TABLET | Freq: Two times a day (BID) | ORAL | 3 refills | Status: DC
  Filled 2022-04-29 – 2022-05-02 (×3): qty 180, 90d supply, fill #0
  Filled 2022-08-11: qty 180, 90d supply, fill #1
  Filled 2022-11-09 (×2): qty 180, 90d supply, fill #2
  Filled 2023-02-11: qty 180, 90d supply, fill #3

## 2022-04-30 ENCOUNTER — Other Ambulatory Visit (HOSPITAL_COMMUNITY): Payer: Self-pay

## 2022-05-02 ENCOUNTER — Other Ambulatory Visit (HOSPITAL_COMMUNITY): Payer: Self-pay

## 2022-05-02 ENCOUNTER — Telehealth: Payer: Self-pay | Admitting: Family Medicine

## 2022-05-02 NOTE — Telephone Encounter (Signed)
Contacted [name] to schedule their annual wellness visit. Appointment made for 07/11/2022.  Gabriel Cirri [facility] AWV TEAM Direct Dial [phone]

## 2022-05-03 DIAGNOSIS — I129 Hypertensive chronic kidney disease with stage 1 through stage 4 chronic kidney disease, or unspecified chronic kidney disease: Secondary | ICD-10-CM | POA: Diagnosis not present

## 2022-05-03 DIAGNOSIS — E210 Primary hyperparathyroidism: Secondary | ICD-10-CM | POA: Diagnosis not present

## 2022-05-03 DIAGNOSIS — E063 Autoimmune thyroiditis: Secondary | ICD-10-CM | POA: Diagnosis not present

## 2022-05-03 DIAGNOSIS — E1169 Type 2 diabetes mellitus with other specified complication: Secondary | ICD-10-CM | POA: Diagnosis not present

## 2022-05-03 DIAGNOSIS — E1122 Type 2 diabetes mellitus with diabetic chronic kidney disease: Secondary | ICD-10-CM | POA: Diagnosis not present

## 2022-05-03 DIAGNOSIS — E559 Vitamin D deficiency, unspecified: Secondary | ICD-10-CM | POA: Diagnosis not present

## 2022-05-03 DIAGNOSIS — M818 Other osteoporosis without current pathological fracture: Secondary | ICD-10-CM | POA: Diagnosis not present

## 2022-05-03 DIAGNOSIS — E038 Other specified hypothyroidism: Secondary | ICD-10-CM | POA: Diagnosis not present

## 2022-05-20 ENCOUNTER — Other Ambulatory Visit: Payer: Self-pay

## 2022-05-20 MED ORDER — TRUE METRIX AIR GLUCOSE METER DEVI: 5 refills | Status: AC

## 2022-06-05 ENCOUNTER — Encounter (HOSPITAL_COMMUNITY): Payer: Self-pay

## 2022-06-05 ENCOUNTER — Emergency Department (HOSPITAL_COMMUNITY): Payer: Medicare HMO

## 2022-06-05 ENCOUNTER — Emergency Department (HOSPITAL_COMMUNITY)
Admission: EM | Admit: 2022-06-05 | Discharge: 2022-06-05 | Disposition: A | Discharge status: Home / Self Care | Payer: Medicare HMO | Attending: Emergency Medicine | Admitting: Emergency Medicine

## 2022-06-05 ENCOUNTER — Other Ambulatory Visit: Payer: Self-pay

## 2022-06-05 DIAGNOSIS — W19XXXA Unspecified fall, initial encounter: Secondary | ICD-10-CM | POA: Diagnosis not present

## 2022-06-05 DIAGNOSIS — E119 Type 2 diabetes mellitus without complications: Secondary | ICD-10-CM | POA: Insufficient documentation

## 2022-06-05 DIAGNOSIS — I2510 Atherosclerotic heart disease of native coronary artery without angina pectoris: Secondary | ICD-10-CM | POA: Diagnosis not present

## 2022-06-05 DIAGNOSIS — M25552 Pain in left hip: Secondary | ICD-10-CM | POA: Diagnosis not present

## 2022-06-05 DIAGNOSIS — J45909 Unspecified asthma, uncomplicated: Secondary | ICD-10-CM | POA: Insufficient documentation

## 2022-06-05 DIAGNOSIS — S7002XA Contusion of left hip, initial encounter: Secondary | ICD-10-CM | POA: Insufficient documentation

## 2022-06-05 DIAGNOSIS — I10 Essential (primary) hypertension: Secondary | ICD-10-CM | POA: Diagnosis not present

## 2022-06-05 DIAGNOSIS — Z79899 Other long term (current) drug therapy: Secondary | ICD-10-CM | POA: Insufficient documentation

## 2022-06-05 DIAGNOSIS — S0990XA Unspecified injury of head, initial encounter: Secondary | ICD-10-CM | POA: Insufficient documentation

## 2022-06-05 DIAGNOSIS — R0789 Other chest pain: Secondary | ICD-10-CM | POA: Insufficient documentation

## 2022-06-05 DIAGNOSIS — Z7951 Long term (current) use of inhaled steroids: Secondary | ICD-10-CM | POA: Diagnosis not present

## 2022-06-05 DIAGNOSIS — M47812 Spondylosis without myelopathy or radiculopathy, cervical region: Secondary | ICD-10-CM | POA: Diagnosis not present

## 2022-06-05 DIAGNOSIS — I6789 Other cerebrovascular disease: Secondary | ICD-10-CM | POA: Diagnosis not present

## 2022-06-05 DIAGNOSIS — R0781 Pleurodynia: Secondary | ICD-10-CM | POA: Diagnosis not present

## 2022-06-05 DIAGNOSIS — W010XXA Fall on same level from slipping, tripping and stumbling without subsequent striking against object, initial encounter: Secondary | ICD-10-CM | POA: Insufficient documentation

## 2022-06-05 DIAGNOSIS — T1490XA Injury, unspecified, initial encounter: Secondary | ICD-10-CM | POA: Diagnosis not present

## 2022-06-05 DIAGNOSIS — Y92009 Unspecified place in unspecified non-institutional (private) residence as the place of occurrence of the external cause: Secondary | ICD-10-CM | POA: Insufficient documentation

## 2022-06-05 DIAGNOSIS — M4312 Spondylolisthesis, cervical region: Secondary | ICD-10-CM | POA: Diagnosis not present

## 2022-06-05 LAB — COMPREHENSIVE METABOLIC PANEL
ALT: 25 U/L (ref 0–44)
AST: 48 U/L — ABNORMAL HIGH (ref 15–41)
Albumin: 3.9 g/dL (ref 3.5–5.0)
Alkaline Phosphatase: 87 U/L (ref 38–126)
Anion gap: 10 (ref 5–15)
BUN: 15 mg/dL (ref 8–23)
CO2: 25 mmol/L (ref 22–32)
Calcium: 8.8 mg/dL — ABNORMAL LOW (ref 8.9–10.3)
Chloride: 98 mmol/L (ref 98–111)
Creatinine, Ser: 0.92 mg/dL (ref 0.44–1.00)
GFR, Estimated: >60 mL/min (ref >60)
Glucose, Bld: 115 mg/dL — ABNORMAL HIGH (ref 70–99)
Potassium: 3.6 mmol/L (ref 3.5–5.1)
Sodium: 133 mmol/L — ABNORMAL LOW (ref 135–145)
Total Bilirubin: 0.8 mg/dL (ref 0.3–1.2)
Total Protein: 7.2 g/dL (ref 6.5–8.1)

## 2022-06-05 LAB — CBC
HCT: 43.4 % (ref 36.0–46.0)
Hemoglobin: 14.3 g/dL (ref 12.0–15.0)
MCH: 29.4 pg (ref 26.0–34.0)
MCHC: 32.9 g/dL (ref 30.0–36.0)
MCV: 89.1 fL (ref 80.0–100.0)
Platelets: 216 10*3/uL (ref 150–400)
RBC: 4.87 MIL/uL (ref 3.87–5.11)
RDW: 13.2 % (ref 11.5–15.5)
WBC: 9 10*3/uL (ref 4.0–10.5)
nRBC: 0 % (ref 0.0–0.2)

## 2022-06-05 LAB — MAGNESIUM: Magnesium: 2.1 mg/dL (ref 1.7–2.4)

## 2022-06-05 MED ORDER — OXYCODONE-ACETAMINOPHEN 5-325 MG PO TABS
1.0000 | ORAL_TABLET | Freq: Once | ORAL | Status: DC
  Filled 2022-06-05: qty 1

## 2022-06-05 NOTE — Discharge Instructions (Signed)
Take pain medication as needed.  Avoid activities that cause severe worsening of your pain.  Follow-up with Dr. Lequita Halt in the office.  Return to the emergency department for any new or worsening symptoms of concern.

## 2022-06-05 NOTE — ED Triage Notes (Signed)
BIBA from home for a mechanical tripped over rug and fell, hurt Lt rib and lt hip, no loc and blood thinners

## 2022-06-05 NOTE — ED Provider Notes (Signed)
[place] EMERGENCY DEPARTMENT AT [facility] Provider Note   CSN: [id number] Arrival date & time: 06/05/22  1933     History  Chief Complaint  Patient presents with   [name] [name] is a 82 y.o. female.   Fall Associated symptoms include chest pain.  Patient presents after a fall.  Medical history includes T2DM, CAD, HLD, depression, HTN, asthma, arthritis, fibromyalgia, anemia.  Fall was unwitnessed.  It occurred at home, where she lives alone.  She describes a mechanical fall during which she tripped.  She pressed her life alert and EMS responded.  Patient was able to be helped to her feet.  She was able to bear a mild amount of weight on her left leg but then endorsed some left hip pain that radiates to left thigh.  Patient was also endorsing left lateral chest wall pain.     Home Medications Prior to Admission medications   Medication Sig Start Date End Date Taking? Authorizing Provider  Blood Glucose Monitoring Suppl (TRUE METRIX AIR GLUCOSE METER) DEVI Use to test blood sugars daily. Dx: E11.9 05/20/22   Shelva Majestic, MD  albuterol (VENTOLIN HFA) 108 (90 Base) MCG/ACT inhaler Inhale 1-2 puffs into the lungs every 4 (four) hours as needed for wheezing or shortness of breath. 07/19/21   Shelva Majestic, MD  amLODipine (NORVASC) 10 MG tablet TAKE 1 TABLET EVERY DAY 09/12/21   Shelva Majestic, MD  Biotin 5000 MCG CAPS Take 5,000 mcg by mouth daily. 01/21/18   [provider]  Cholecalciferol (VITAMIN D-3) 125 MCG (5000 UT) TABS Take 5,000 Units by mouth daily.    [provider]  cyanocobalamin (,VITAMIN B-12,) 1000 MCG/ML injection INJECT (1,000 MCG TOTAL)  INTO THE MUSCLE EVERY 30 DAYS USE ON A MONTHLY BASES 07/13/21   Shelva Majestic, MD  desvenlafaxine (PRISTIQ) 100 MG 24 hr tablet TAKE 1 TABLET EVERY DAY 01/07/22   Shelva Majestic, MD  diclofenac (VOLTAREN) 75 MG EC tablet Take 1 tablet (75 mg total) by mouth 2 (two) times  daily. 04/29/22   Shelva Majestic, MD  Evolocumab (REPATHA SURECLICK) 140 MG/ML SOAJ INJECT 1 PEN SUBCUTANEOUSLY EVERY TWO WEEKS Patient taking differently: Inject 140 mg into the skin every 14 (fourteen) days. 10/08/21   Lyn Records, MD  FeFum-FePo-FA-B Cmp-C-Zn-Mn-Cu (SE-TAN PLUS) 162-115.2-1 MG CAPS TAKE 1 CAPSULE BY MOUTH TWICE A DAY Patient taking differently: Take 1 capsule by mouth daily. 10/18/21   Shelva Majestic, MD  fexofenadine (ALLEGRA) 180 MG tablet Take 180 mg by mouth daily as needed for allergies or rhinitis.    [provider]  fluticasone (FLONASE) 50 MCG/ACT nasal spray Place 1 spray into both nostrils daily. 09/12/21   [provider]  glucose blood test strip Test blood sugar once per day 12/14/19   Shelva Majestic, MD  HYDROcodone-acetaminophen (NORCO/VICODIN) 5-325 MG tablet Take 2 tablets by mouth every 6 (six) hours as needed for moderate pain. 03/26/22   Shelva Majestic, MD  Levothyroxine Sodium 150 MCG CAPS Take 150 mcg by mouth daily. 09/18/17   [provider]  loperamide (IMODIUM) 2 MG capsule Take 4 mg by mouth daily as needed for diarrhea or loose stools.     [provider]  nitroGLYCERIN (NITROSTAT) 0.4 MG SL tablet Place 1 tablet (0.4 mg total) under the tongue every 5 (five) minutes as needed for chest pain. 07/19/21   Shelva Majestic, MD  Omega-3 Fatty Acids (FISH OIL) 1000 MG CAPS Take 1,000 mg by mouth daily.    [provider]  Propylene Glycol (SYSTANE COMPLETE OP) Place 1 drop into both eyes in the morning.    [provider]  TRUEplus Lancets 30G MISC Check blood sugar once per day 12/14/19   Shelva Majestic, MD      Allergies    Flexeril [cyclobenzaprine], Ace inhibitors, Codeine, Kiwi extract, Metoprolol, and Azithromycin    Review of Systems   Review of Systems  Cardiovascular:  Positive for chest pain.  Musculoskeletal:  Positive for arthralgias.  All other systems reviewed and are  negative.   Physical Exam Updated Vital Signs BP (!) 159/78   Pulse 61   Temp 98.3 F (36.8 C)   Resp 11   Ht 5\' 1"  (1.549 m)   Wt 97.1 kg   SpO2 100%   BMI 40.43 kg/m  Physical Exam Vitals and nursing note reviewed.  Constitutional:      General: She is not in acute distress.    Appearance: Normal appearance. She is well-developed. She is not ill-appearing, toxic-appearing or diaphoretic.  HENT:     Head: Normocephalic and atraumatic.     Right Ear: External ear normal.     Left Ear: External ear normal.     Nose: Nose normal.     Mouth/Throat:     Mouth: Mucous membranes are moist.  Eyes:     Extraocular Movements: Extraocular movements intact.     Conjunctiva/sclera: Conjunctivae normal.  Cardiovascular:     Rate and Rhythm: Normal rate and regular rhythm.  Pulmonary:     Effort: Pulmonary effort is normal. No respiratory distress.  Chest:     Chest wall: Tenderness present.  Abdominal:     General: There is no distension.     Palpations: Abdomen is soft.     Tenderness: There is no abdominal tenderness.  Musculoskeletal:        General: No swelling.     Cervical back: Normal range of motion and neck supple.     Right lower leg: No edema.     Left lower leg: No edema.     Comments: Left hip ROM limited by pain.  Skin:    General: Skin is warm and dry.     Coloration: Skin is not jaundiced or pale.  Neurological:     General: No focal deficit present.     Mental Status: She is alert and oriented to person, place, and time.     Cranial Nerves: No cranial nerve deficit.     Sensory: No sensory deficit.     Motor: No weakness.     Coordination: Coordination normal.  Psychiatric:        Mood and Affect: Mood normal.        Behavior: Behavior normal.        Thought Content: Thought content normal.        Judgment: Judgment normal.     ED Results / Procedures / Treatments   Labs (all labs ordered are listed, but only abnormal results are displayed) Labs  Reviewed  COMPREHENSIVE METABOLIC PANEL - Abnormal; Notable for the following components:      Result Value   Sodium 133 (*)    Glucose, Bld 115 (*)    Calcium 8.8 (*)    AST 48 (*)    All other components within normal limits  CBC  MAGNESIUM    EKG EKG Interpretation  Date/Time:  Wednesday Jun 05 2022 20:15:59 EDT Ventricular Rate:  61 PR Interval:  183 QRS Duration: 115 QT Interval:  471 QTC Calculation: 475 R Axis:   42 Text Interpretation: Sinus rhythm Ventricular premature complex Nonspecific intraventricular conduction delay Confirmed by Gloris Manchester [phone]) on 06/05/2022 8:37:58 PM  Radiology CT CHEST ABDOMEN PELVIS WO CONTRAST  Result Date: 06/05/2022 CLINICAL DATA:  Polytrauma, blunt EXAM: CT CHEST, ABDOMEN AND PELVIS WITHOUT CONTRAST TECHNIQUE: Multidetector CT imaging of the chest, abdomen and pelvis was performed following the standard protocol without IV contrast. RADIATION DOSE REDUCTION: This exam was performed according to the departmental dose-optimization program which includes automated exposure control, adjustment of the mA and/or kV according to patient size and/or use of iterative reconstruction technique. COMPARISON:  CT chest 02/01/2021 FINDINGS: CHEST: Cardiovascular: The thoracic aorta is normal in caliber. The heart is normal in size. No significant pericardial effusion. Moderate severe atherosclerotic plaque. Four-vessel coronary calcification. Lungs/Pleura: No focal consolidation. Stable scattered pulmonary and subpleural micronodules. A stable left upper lobe measuring 0.9 x 0.6 cm. No pulmonary mass. No pulmonary contusion or laceration. No pneumatocele formation. No pleural effusion. No pneumothorax. No hemothorax. Mediastinum/Nodes: No pneumomediastinum. The central airways are patent. The esophagus is unremarkable. The thyroid is unremarkable. Limited evaluation for hilar lymphadenopathy on this noncontrast study. No mediastinal or axillary lymphadenopathy.  Musculoskeletal/Chest wall No chest wall mass. Chronic right anterior rib fractures. No acute rib or sternal fracture. No spinal fracture. Multilevel intervertebral disc space vacuum phenomenon degenerative changes. Total left shoulder arthroplasty partially visualized. Severe degenerative changes of the right shoulder. ABDOMEN / PELVIS: Hepatobiliary: Not enlarged. No focal lesion. Cholelithiasis. No gallbladder wall thickening or pericholecystic fluid. No biliary ductal dilatation. Pancreas: Normal pancreatic contour. No main pancreatic duct dilatation. Spleen: Not enlarged. No focal lesion. Adrenals/Urinary Tract: No nodularity bilaterally. No hydroureteronephrosis. No nephroureterolithiasis. No contour deforming renal mass. The urinary bladder is unremarkable. Stomach/Bowel: Roux-en-Y gastric surgical changes. No small or large bowel wall thickening or dilatation. The appendix is not definitely identified with no inflammatory changes in the right lower quadrant to suggest acute appendicitis. Vasculature/Lymphatic: Severe atherosclerotic plaque. No abdominal aorta or iliac aneurysm. No abdominal, pelvic, inguinal lymphadenopathy. Reproductive: Status post hysterectomy. Bilateral adnexal regions are unremarkable. Other: No simple free fluid ascites. No pneumoperitoneum. No mesenteric hematoma identified. No organized fluid collection. Musculoskeletal: Fat containing supraumbilical ventral wall hernia (3:64). Left hip subcutaneus soft tissue hematoma formation with query developing Norma Fredrickson lesion (3: 81-98).Possible developing underlying intramuscular hematoma formation. No acute pelvic fracture. No spinal fracture. Levoscoliosis of the lumbar spine centered at the L3 level with compensatory dextroscoliosis of the thoracolumbar spine. L4-L5 posterolateral and interbody surgical hardware fusion. L5-S1 intervertebral disc space vacuum phenomenon and endplate sclerosis. Multilevel intervertebral disc space  vacuum phenomenon and degenerative changes. Ports and Devices: None. IMPRESSION: 1. No acute intrathoracic, intra-abdominal, intrapelvic traumatic injury with limited evaluation on this noncontrast study. 2. Left hip subcutaneus soft tissue hematoma formation with query developing Morel Lavallee lesion. Possible developing underlying intramuscular hematoma formation no underlying osseous fracture. 3. No acute fracture or traumatic malalignment of the thoracic or lumbar spine. Other imaging findings of potential clinical significance: 1. Aortic Atherosclerosis (ICD10-I70.0) including four-vessel coronary artery calcification. 2. Stable 0.9 x 0.6 cm left upper lobe subpleural nodule. Recommend repeat CT in 12 months (from today's scan) is considered optional for low-risk patients, but is recommended for high-risk patients. This recommendation follows the consensus statement: Guidelines for Management of Incidental Pulmonary Nodules Detected on CT Images: From the Fleischner  Society 2017; Radiology 2017; [phone]. Electronically Signed   By: Tish Frederickson M.D.   On: 06/05/2022 22:15   CT Head Wo Contrast  Result Date: 06/05/2022 CLINICAL DATA:  Head trauma, minor (Age >= 65y).  Fall. EXAM: CT HEAD WITHOUT CONTRAST TECHNIQUE: Contiguous axial images were obtained from the base of the skull through the vertex without intravenous contrast. RADIATION DOSE REDUCTION: This exam was performed according to the departmental dose-optimization program which includes automated exposure control, adjustment of the mA and/or kV according to patient size and/or use of iterative reconstruction technique. COMPARISON:  None Available. FINDINGS: Brain: There is atrophy and chronic small vessel disease changes. No acute intracranial abnormality. Specifically, no hemorrhage, hydrocephalus, mass lesion, acute infarction, or significant intracranial injury. Vascular: No hyperdense vessel or unexpected calcification. Skull: No acute  calvarial abnormality. Sinuses/Orbits: No acute findings Other: None IMPRESSION: Atrophy, chronic microvascular disease. No acute intracranial abnormality. Electronically Signed   By: Charlett Nose M.D.   On: 06/05/2022 21:36   CT Cervical Spine Wo Contrast  Result Date: 06/05/2022 CLINICAL DATA:  Neck trauma (Age >= 65y).  Fall. EXAM: CT CERVICAL SPINE WITHOUT CONTRAST TECHNIQUE: Multidetector CT imaging of the cervical spine was performed without intravenous contrast. Multiplanar CT image reconstructions were also generated. RADIATION DOSE REDUCTION: This exam was performed according to the departmental dose-optimization program which includes automated exposure control, adjustment of the mA and/or kV according to patient size and/or use of iterative reconstruction technique. COMPARISON:  None Available. FINDINGS: Alignment: 3 mm degenerative anterolisthesis of C4 on C5. Skull base and vertebrae: No acute fracture. No primary bone lesion or focal pathologic process. Soft tissues and spinal canal: No prevertebral fluid or swelling. No visible canal hematoma. Disc levels: Diffuse advanced degenerative disc disease, most pronounced at C5-6 and C6-7 with disc space narrowing and spurring. No disc herniation. Moderate to advanced degenerative facet disease bilaterally diffusely. Upper chest: No acute findings Other: None IMPRESSION: Cervical spondylosis.  No acute bony abnormality. Electronically Signed   By: Charlett Nose M.D.   On: 06/05/2022 21:23   DG Chest Port 1 View  Result Date: 06/05/2022 CLINICAL DATA:  Trip and fall injury.  Rib pain.  Left hip pain. EXAM: DG HIP (WITH OR WITHOUT PELVIS) 2-3V LEFT; PORTABLE CHEST - 1 VIEW COMPARISON:  PA chest and bilateral rib series 03/12/2022, AP pelvis and right hip 03/02/2015 FINDINGS: Chest AP portable at 8:01 p.m.: There is mild-to-moderate cardiomegaly. Vascular markings are normal caliber. No edema or other acute airspace infiltrate is seen. The sulci are  sharp. The mediastinum is stable with aortic tortuosity and calcification. Again noted are osteopenia, degenerative changes of the spine and left shoulder replacement. No displaced rib fracture is seen on this nondedicated exam. No pneumothorax. AP pelvis and separate AP and frog-leg left hip: There is osteopenia without evidence of fractures of the AP pelvis and proximal left femur. There is mild symmetric nonerosive degenerative arthrosis of the hips with enthesopathic changes of the pelvis and trochanters. Spurring and narrowing noted SI joints pubic symphysis, old L4-5 dorsal fusion construct. There are scattered pelvic phleboliths with no other significant visible soft tissue findings. Compare: Comparison to prior chest and rib series and 2017 pelvis and right hip views reveals no significant interval change. IMPRESSION: 1. Cardiomegaly without evidence of acute chest disease. 2. Osteopenia without evidence of fractures of the AP pelvis and proximal left femur. 3. Aortic atherosclerosis. 4. Chronic degenerative and postsurgical changes. Electronically Signed   By: Earlean Shawl.D.  On: 06/05/2022 20:23   DG Hip Unilat W or Wo Pelvis 2-3 Views Left  Result Date: 06/05/2022 CLINICAL DATA:  Trip and fall injury.  Rib pain.  Left hip pain. EXAM: DG HIP (WITH OR WITHOUT PELVIS) 2-3V LEFT; PORTABLE CHEST - 1 VIEW COMPARISON:  PA chest and bilateral rib series 03/12/2022, AP pelvis and right hip 03/02/2015 FINDINGS: Chest AP portable at 8:01 p.m.: There is mild-to-moderate cardiomegaly. Vascular markings are normal caliber. No edema or other acute airspace infiltrate is seen. The sulci are sharp. The mediastinum is stable with aortic tortuosity and calcification. Again noted are osteopenia, degenerative changes of the spine and left shoulder replacement. No displaced rib fracture is seen on this nondedicated exam. No pneumothorax. AP pelvis and separate AP and frog-leg left hip: There is osteopenia without  evidence of fractures of the AP pelvis and proximal left femur. There is mild symmetric nonerosive degenerative arthrosis of the hips with enthesopathic changes of the pelvis and trochanters. Spurring and narrowing noted SI joints pubic symphysis, old L4-5 dorsal fusion construct. There are scattered pelvic phleboliths with no other significant visible soft tissue findings. Compare: Comparison to prior chest and rib series and 2017 pelvis and right hip views reveals no significant interval change. IMPRESSION: 1. Cardiomegaly without evidence of acute chest disease. 2. Osteopenia without evidence of fractures of the AP pelvis and proximal left femur. 3. Aortic atherosclerosis. 4. Chronic degenerative and postsurgical changes. Electronically Signed   By: Almira Bar M.D.   On: 06/05/2022 20:23    Procedures Procedures    Medications Ordered in ED Medications  oxyCODONE-acetaminophen (PERCOCET/ROXICET) 5-325 MG per tablet 1 tablet (1 tablet Oral Not Given 06/05/22 2123)    ED Course/ Medical Decision Making/ A&P                             Medical Decision Making Amount and/or Complexity of Data Reviewed Labs: ordered. Radiology: ordered. ECG/medicine tests: ordered.  Risk Prescription drug management.   This patient presents to the ED for concern of fall, this involves an extensive number of treatment options, and is a complaint that carries with it a high risk of complications and morbidity.  The differential diagnosis includes acute injuries   Co morbidities that complicate the patient evaluation  T2DM, CAD, HLD, depression, HTN, asthma, arthritis, fibromyalgia, anemia   Additional history obtained:  Additional history obtained from EMS External records from outside source obtained and reviewed including EMR   Lab Tests:  I Ordered, and personally interpreted labs.  The pertinent results include: Normal hemoglobin, no leukocytosis, normal electrolytes   Imaging Studies  ordered:  I ordered imaging studies including x-ray of chest and left hip; CT of head, cervical spine, chest, abdomen, pelvis I independently visualized and interpreted imaging which showed left hip subcutaneous hematoma with possibly developing underlying intramuscular hematoma. I agree with the radiologist interpretation   Cardiac Monitoring: / EKG:  The patient was maintained on a cardiac monitor.  I personally viewed and interpreted the cardiac monitored which showed an underlying rhythm of: Sinus rhythm   Consultations Obtained:  I requested consultation with the orthopedic surgeon, Dr. Ranell Patrick,  and discussed lab and imaging findings as well as pertinent plan - they recommend: Protected weightbearing and outpatient follow-up   Problem List / ED Course / Critical interventions / Medication management  Patient presents after ground-level fall.  This occurred at home from tripping on a rug.  She landed on  her left side and has since had pain to her left hip and left lower lateral chest wall.  On exam, patient is alert and oriented.  Breathing is unlabored.  She has tenderness to left lower lateral ribs.  She has no focal neurologic deficits.  Left hip range of motion is limited by pain.  No deformities are appreciated.  Initial x-ray imaging of chest and left hip did not show any acute findings.  Patient underwent CT imaging which did show a left hip subcutaneous soft tissue hematoma.  On radiology report, there was concern of developing Susette Racer lesion.  I discussed this with orthopedic surgeon on-call, Dr. Devonne Doughty, who feels that this is unlikely given the low mechanism of injury.  He suspects this is likely simply a hematoma.  Patient is currently not on any blood thinners.  Dr. Devonne Doughty recommends protected weightbearing and outpatient follow-up.  Patient is comfortable with this plan.  She declined any pain medication while in the ED.  She declines any prescription for narcotic pain  medication.  She states that she does have some Norco at home and will take Tylenol as needed.  Patient was discharged in stable condition.   Social Determinants of Health:  Has access to outpatient care, lives independently        Final Clinical Impression(s) / ED Diagnoses Final diagnoses:  Fall, initial encounter  Hematoma of left hip, initial encounter    Rx / DC Orders ED Discharge Orders     None         Gloris Manchester, MD 06/05/22 2300

## 2022-06-06 ENCOUNTER — Ambulatory Visit: Payer: Self-pay | Admitting: *Deleted

## 2022-06-06 NOTE — Chronic Care Management (AMB) (Signed)
   06/06/2022  EVANJELINA OCKERMAN 23-Jul-1940 [id number]  Patient is not enrolled in chronic care management services, (CCM),  status changed to previously enrolled.  Irving Shows [facility], BSN RN Case Manager [phone]

## 2022-06-18 ENCOUNTER — Encounter: Payer: Self-pay | Admitting: Family Medicine

## 2022-07-05 DIAGNOSIS — E063 Autoimmune thyroiditis: Secondary | ICD-10-CM | POA: Diagnosis not present

## 2022-07-05 DIAGNOSIS — E038 Other specified hypothyroidism: Secondary | ICD-10-CM | POA: Diagnosis not present

## 2022-07-11 ENCOUNTER — Encounter: Payer: Self-pay | Admitting: Family Medicine

## 2022-07-11 ENCOUNTER — Ambulatory Visit (INDEPENDENT_AMBULATORY_CARE_PROVIDER_SITE_OTHER): Payer: Medicare HMO | Admitting: Family Medicine

## 2022-07-11 ENCOUNTER — Ambulatory Visit (INDEPENDENT_AMBULATORY_CARE_PROVIDER_SITE_OTHER): Payer: Medicare HMO

## 2022-07-11 VITALS — BP 140/80 | HR 65 | Temp 98.7°F | Wt 216.4 lb

## 2022-07-11 VITALS — BP 140/80 | HR 65 | Temp 98.7°F | Ht 61.0 in | Wt 216.0 lb

## 2022-07-11 DIAGNOSIS — F3342 Major depressive disorder, recurrent, in full remission: Secondary | ICD-10-CM | POA: Diagnosis not present

## 2022-07-11 DIAGNOSIS — E210 Primary hyperparathyroidism: Secondary | ICD-10-CM | POA: Diagnosis not present

## 2022-07-11 DIAGNOSIS — E1169 Type 2 diabetes mellitus with other specified complication: Secondary | ICD-10-CM | POA: Diagnosis not present

## 2022-07-11 DIAGNOSIS — E039 Hypothyroidism, unspecified: Secondary | ICD-10-CM

## 2022-07-11 DIAGNOSIS — E538 Deficiency of other specified B group vitamins: Secondary | ICD-10-CM

## 2022-07-11 DIAGNOSIS — E119 Type 2 diabetes mellitus without complications: Secondary | ICD-10-CM

## 2022-07-11 DIAGNOSIS — Z0000 Encounter for general adult medical examination without abnormal findings: Secondary | ICD-10-CM | POA: Diagnosis not present

## 2022-07-11 DIAGNOSIS — I10 Essential (primary) hypertension: Secondary | ICD-10-CM | POA: Diagnosis not present

## 2022-07-11 DIAGNOSIS — E785 Hyperlipidemia, unspecified: Secondary | ICD-10-CM

## 2022-07-11 LAB — COMPREHENSIVE METABOLIC PANEL
ALT: 28 U/L (ref 0–35)
AST: 28 U/L (ref 0–37)
Albumin: 4.2 g/dL (ref 3.5–5.2)
Alkaline Phosphatase: 100 U/L (ref 39–117)
BUN: 9 mg/dL (ref 6–23)
CO2: 32 mEq/L (ref 19–32)
Calcium: 9.3 mg/dL (ref 8.4–10.5)
Chloride: 100 mEq/L (ref 96–112)
Creatinine, Ser: 0.93 mg/dL (ref 0.40–1.20)
GFR: 57.32 mL/min — ABNORMAL LOW (ref >60.00)
Glucose, Bld: 102 mg/dL — ABNORMAL HIGH (ref 70–99)
Potassium: 4.4 mEq/L (ref 3.5–5.1)
Sodium: 140 mEq/L (ref 135–145)
Total Bilirubin: 0.7 mg/dL (ref 0.2–1.2)
Total Protein: 7 g/dL (ref 6.0–8.3)

## 2022-07-11 LAB — MICROALBUMIN / CREATININE URINE RATIO
Creatinine,U: 44.1 mg/dL
Microalb Creat Ratio: 2.6 mg/g (ref 0.0–30.0)
Microalb, Ur: 1.1 mg/dL (ref 0.0–1.9)

## 2022-07-11 LAB — HEMOGLOBIN A1C: Hgb A1c MFr Bld: 6.1 % (ref 4.6–6.5)

## 2022-07-11 LAB — VITAMIN B12: Vitamin B-12: 810 pg/mL (ref 211–911)

## 2022-07-11 MED ORDER — HYDROCODONE-ACETAMINOPHEN 5-325 MG PO TABS: 2.0000 | ORAL_TABLET | Freq: Four times a day (QID) | ORAL | 0 refills | Status: DC | PRN

## 2022-07-11 MED ORDER — HYDROCHLOROTHIAZIDE 12.5 MG PO TABS: 12.5000 mg | ORAL_TABLET | Freq: Every day | ORAL | 3 refills | Status: DC

## 2022-07-11 NOTE — Addendum Note (Signed)
Addended by: Marzella Schlein on: 07/11/2022 08:45 AM   Modules accepted: Level of Service

## 2022-07-11 NOTE — Addendum Note (Signed)
Addended by: Shelva Majestic on: 07/11/2022 08:48 PM   Modules accepted: Level of Service

## 2022-07-11 NOTE — Progress Notes (Signed)
Subjective:   ILIAH MOFFITT is a 82 y.o. female who presents for Medicare Annual (Subsequent) preventive examination.  Visit Complete: In person    Review of Systems     Cardiac Risk Factors include: advanced age (>44men, >28 women);diabetes mellitus;dyslipidemia;hypertension;obesity (BMI >30kg/m2)     Objective:    Today's Vitals   07/11/22 0809  BP: (!) 140/80  Pulse: 65  Temp: 98.7 F (37.1 C)  SpO2: 94%  Weight: 216 lb 6.4 oz (98.2 kg)   Body mass index is 40.89 kg/m.     07/11/2022    8:25 AM 06/05/2022    7:41 PM 03/13/2022    9:18 AM 02/27/2022   10:22 AM 03/01/2021   10:54 AM 02/12/2021    1:52 PM 07/18/2020   11:13 AM  Advanced Directives  Does Patient Have a Medical Advance Directive? Yes No Yes No Yes Yes No  Type of Estate agent of [place];Living will  Healthcare Power of [place];Living will  Healthcare Power of [place];Living will Healthcare Power of Attorney   Copy of Healthcare Power of Attorney in Chart? No - copy requested    No - copy requested No - copy requested   Would patient like information on creating a medical advance directive?  No - Patient declined  No - Patient declined   No - Patient declined    Current Medications (verified) Outpatient Encounter Medications as of 07/11/2022  Medication Sig   albuterol (VENTOLIN HFA) 108 (90 Base) MCG/ACT inhaler Inhale 1-2 puffs into the lungs every 4 (four) hours as needed for wheezing or shortness of breath.   amLODipine (NORVASC) 10 MG tablet TAKE 1 TABLET EVERY DAY   Biotin 5000 MCG CAPS Take 5,000 mcg by mouth daily.   Blood Glucose Monitoring Suppl (TRUE METRIX AIR GLUCOSE METER) DEVI Use to test blood sugars daily. Dx: E11.9   Cholecalciferol (VITAMIN D-3) 125 MCG (5000 UT) TABS Take 5,000 Units by mouth daily.   desvenlafaxine (PRISTIQ) 100 MG 24 hr tablet TAKE 1 TABLET EVERY DAY   diclofenac (VOLTAREN) 75 MG EC tablet Take 1 tablet (75 mg total) by mouth 2 (two) times  daily.   Evolocumab (REPATHA SURECLICK) 140 MG/ML SOAJ INJECT 1 PEN SUBCUTANEOUSLY EVERY TWO WEEKS (Patient taking differently: Inject 140 mg into the skin every 14 (fourteen) days.)   FeFum-FePo-FA-B Cmp-C-Zn-Mn-Cu (SE-TAN PLUS) 162-115.2-1 MG CAPS TAKE 1 CAPSULE BY MOUTH TWICE A DAY (Patient taking differently: Take 1 capsule by mouth daily.)   fluticasone (FLONASE) 50 MCG/ACT nasal spray Place 1 spray into both nostrils daily.   glucose blood test strip Test blood sugar once per day   HYDROcodone-acetaminophen (NORCO/VICODIN) 5-325 MG tablet Take 2 tablets by mouth every 6 (six) hours as needed for moderate pain.   Levothyroxine Sodium 150 MCG CAPS Take 150 mcg by mouth daily.   loperamide (IMODIUM) 2 MG capsule Take 4 mg by mouth daily as needed for diarrhea or loose stools.    nitroGLYCERIN (NITROSTAT) 0.4 MG SL tablet Place 1 tablet (0.4 mg total) under the tongue every 5 (five) minutes as needed for chest pain.   Omega-3 Fatty Acids (FISH OIL) 1000 MG CAPS Take 1,000 mg by mouth daily.   Propylene Glycol (SYSTANE COMPLETE OP) Place 1 drop into both eyes in the morning.   TRUEplus Lancets 30G MISC Check blood sugar once per day   cyanocobalamin (,VITAMIN B-12,) 1000 MCG/ML injection INJECT (1,000 MCG TOTAL)  INTO THE MUSCLE EVERY 30 DAYS USE ON A  MONTHLY BASES (Patient not taking: Reported on 07/11/2022)   [DISCONTINUED] fexofenadine (ALLEGRA) 180 MG tablet Take 180 mg by mouth daily as needed for allergies or rhinitis.   No facility-administered encounter medications on file as of 07/11/2022.    Allergies (verified) Flexeril [cyclobenzaprine], Ace inhibitors, Codeine, Kiwi extract, Metoprolol, and Azithromycin   History: Past Medical History:  Diagnosis Date   Anemia    Anxiety    Arthritis    osteoarthritis - shoulder, knees & hips   Asthma    Coronary artery disease    Depression    Diabetes mellitus without complication (HCC)    no meds   Fibromyalgia    Heart murmur     Hyperparathyroidism (HCC)    Hypertension    Dr. Terrilee Files manages BP, pt. reports MD has not found a need for treatment    Hypothyroidism    Polymyalgia rheumatica (HCC)    Sleep difficulties    had sleep study -2009, prior to gastric surgery, told that there was not a need for f/u   Thyroid disease    Vitamin B 12 deficiency    Past Surgical History:  Procedure Laterality Date   ABDOMINAL HYSTERECTOMY     BARIATRIC SURGERY     CARDIAC CATHETERIZATION     [facility]- 30 yrs. ago   CATARACT EXTRACTION, BILATERAL     late 2018   LEFT HEART CATH AND CORONARY ANGIOGRAPHY N/A 04/02/2018   Procedure: LEFT HEART CATH AND CORONARY ANGIOGRAPHY;  Surgeon: Swaziland, Peter M, MD;  Location: [facility] INVASIVE CV LAB;  Service: Cardiovascular;  Laterality: N/A;   OOPHORECTOMY     pantallor arthrodesis with rod placement left foot     PARATHYROIDECTOMY Left 03/16/2021   Procedure: LEFT INFERIOR PARATHYROIDECTOMY;  Surgeon: Darnell Level, MD;  Location: [facility] ORS;  Service: General;  Laterality: Left;   TONSILLECTOMY     TOTAL KNEE ARTHROPLASTY Left 11/30/2018   Procedure: TOTAL KNEE ARTHROPLASTY;  Surgeon: Ollen Gross, MD;  Location: [facility] ORS;  Service: Orthopedics;  Laterality: Left;   TOTAL KNEE ARTHROPLASTY Right 06/28/2019   Procedure: TOTAL KNEE ARTHROPLASTY;  Surgeon: Ollen Gross, MD;  Location: [facility] ORS;  Service: Orthopedics;  Laterality: Right;    TOTAL SHOULDER ARTHROPLASTY Left 03/12/2012   Procedure: TOTAL SHOULDER ARTHROPLASTY;  Surgeon: Senaida Lange, MD;  Location: [facility] OR;  Service: Orthopedics;  Laterality: Left;   Family History  Problem Relation Age of Onset   Stroke Mother    Liver cancer Father    Cancer Father        liver   Breast cancer Neg Hx    Social History   Socioeconomic History   Marital status: Widowed    Spouse name: Not on file   Number of children: Not on file   Years of education: Not on file   Highest education level: Not on file  Occupational History    Occupation: retired Academic librarian: RETIRED  Tobacco Use   Smoking status: Former    Packs/day: 0.70    Years: 20.00    Additional pack years: 0.00    Total pack years: 14.00    Types: Cigarettes    Quit date: 03/04/1978    Years since quitting: 44.3   Smokeless tobacco: Never  Vaping Use   Vaping Use: Never used  Substance and Sexual Activity   Alcohol use: Yes    Alcohol/week: 2.0 standard drinks of alcohol    Types: 2 Glasses of wine per week  Comment: with dinner  or social rare -    Drug use: No   Sexual activity: Not Currently    Birth control/protection: Surgical  Other Topics Concern   Not on file  Social History Narrative   Widowed 2013. 2 kids. 4 grandkids. Oldest daughter lives in [place] and has 2 grandkids that are now in Oceanographer, PT school at [employer].       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 [place] [address])   Does some dog sitting at her home   Live alone one   Social Determinants of Health   Financial Resource Strain: Low Risk  (07/11/2022)   Overall Financial Resource Strain (CARDIA)    Difficulty of Paying Living Expenses: Not hard at all  Food Insecurity: No Food Insecurity (07/11/2022)   Hunger Vital Sign    Worried About Running Out of Food in the Last Year: Never true    Ran Out of Food in the Last Year: Never true  Transportation Needs: No Transportation Needs (07/11/2022)   PRAPARE - Administrator, Civil Service (Medical): No    Lack of Transportation (Non-Medical): No  Physical Activity: Inactive (07/11/2022)   Exercise Vital Sign    Days of Exercise per Week: 0 days    Minutes of Exercise per Session: 0 min  Stress: No Stress Concern Present (07/11/2022)   [employer] of Occupational Health - Occupational Stress Questionnaire    Feeling of Stress : Not at all  Social Connections: Moderately Integrated (07/11/2022)   Social Connection  and Isolation Panel [NHANES]    Frequency of Communication with Friends and Family: More than three times a week    Frequency of Social Gatherings with Friends and Family: More than three times a week    Attends Religious Services: More than 4 times per year    Active Member of [employer] or Organizations: Yes    Attends Banker Meetings: 1 to 4 times per year    Marital Status: Widowed    Tobacco Counseling Counseling given: Not Answered   Clinical Intake:  Pre-visit preparation completed: Yes  Pain : No/denies pain     BMI - recorded: 40.89 Nutritional Status: BMI > 30  Obese Nutritional Risks: None Diabetes: Yes CBG done?: No Did pt. bring in CBG monitor from home?: No  How often do you need to have someone help you when you read instructions, pamphlets, or other written materials from your doctor or pharmacy?: 1 - Never  Interpreter Needed?: No  Information entered by :: Lanier Ensign, LPN   Activities of Daily Living    07/11/2022    8:27 AM 03/01/2022    1:49 PM  In your present state of health, do you have any difficulty performing the following activities:  Hearing? 0   Vision? 0   Difficulty concentrating or making decisions? 0   Walking or climbing stairs? 1   Dressing or bathing? 0   Doing errands, shopping? 0 0  Preparing Food and eating ? N   Using the Toilet? N   In the past six months, have you accidently leaked urine? N   Do you have problems with loss of bowel control? N   Managing your Medications? N   Managing your Finances? N   Housekeeping or managing your Housekeeping? N     Patient Care Team: Shelva Majestic, MD as PCP -  General (Family Medicine) Lyn Records, MD (Inactive) as PCP - Cardiology (Cardiology) Vida Rigger, MD as Consulting Physician (Gastroenterology) Altheimer, Casimiro Needle, MD as Referring Physician (Endocrinology) Maris Berger, MD as Consulting Physician (Ophthalmology) Ollen Gross, MD as Consulting  Physician (Orthopedic Surgery) Luretha Murphy, MD as Consulting Physician (General Surgery) Heloise Purpura, MD as Consulting Physician (Urology) Drema Halon, MD (Inactive) as Consulting Physician (Otolaryngology) Ollen Gross, MD as Consulting Physician (Orthopedic Surgery) Erroll Luna, [facility] (Inactive) as Pharmacist (Pharmacist)  Indicate any recent Medical Services you may have received from other than [facility] providers in the past year (date may be approximate).     Assessment:   This is a routine wellness examination for Olivia Lopez de Gutierrez.  Hearing/Vision screen Hearing Screening - Comments:: Pt denies any hearing issues  Vision Screening - Comments:: Pt follows up with Dr Charlotte Sanes for annual eye exams   Dietary issues and exercise activities discussed:     Goals Addressed             This Visit's Progress    Patient Stated       Lose weight        Depression Screen    07/11/2022    8:24 AM 03/26/2022   10:59 AM 03/07/2022   11:07 AM 03/04/2022    5:07 PM 11/06/2021   11:24 AM 06/11/2021   10:03 AM 02/16/2021   11:37 AM  PHQ 2/9 Scores  PHQ - 2 Score 1 1 0 0 0 1 0  PHQ- 9 Score  12  4 0 6 2    Fall Risk    07/11/2022    8:25 AM 03/26/2022   10:56 AM 03/13/2022    9:17 AM 03/07/2022   10:55 AM 03/04/2022    3:55 PM  Fall Risk   Falls in the past year? 1 1 1 1 1   Number falls in past yr: 1 0 0 0 0  Injury with Fall? 1 1 1 1 1   Comment head injury in ICU for a FEW DAYS      Risk for fall due to : History of fall(s);Impaired balance/gait;Impaired mobility;Impaired vision History of fall(s)  Impaired balance/gait Impaired balance/gait  Follow up Falls prevention discussed Falls evaluation completed Falls evaluation completed Falls prevention discussed Falls prevention discussed;Education provided    MEDICARE RISK AT HOME:  Medicare Risk at Home - 07/11/22 0827     Any stairs in or around the home? Yes    If so, are there any without handrails? No    Home  free of loose throw rugs in walkways, pet beds, electrical cords, etc? Yes    Adequate lighting in your home to reduce risk of falls? Yes    Life alert? Yes    Use of a cane, walker or w/c? Yes    Grab bars in the bathroom? Yes    Shower chair or bench in shower? Yes    Elevated toilet seat or a handicapped toilet? No             TIMED UP AND GO:  Was the test performed?  Yes  Length of time to ambulate 10 feet: 15 sec Gait steady and fast with assistive device    Cognitive Function:        07/11/2022    8:28 AM 02/12/2021    1:54 PM 04/02/2019    2:16 PM  6CIT Screen  What Year? 0 points 0 points 0 points  What month? 0 points 0 points 0 points  What time? 0 points 0 points 0 points  Count back from 20 0 points 0 points 0 points  Months in reverse 0 points 0 points 0 points  Repeat phrase 0 points 0 points 0 points  Total Score 0 points 0 points 0 points    Immunizations Immunization History  Administered Date(s) Administered   Fluad Quad(high Dose 65+) 09/22/2018, 10/19/2019, 10/07/2020, 10/02/2021   Influenza Split 10/22/2010, 11/14/2011, 10/21/2013   Influenza Whole 11/13/2006, 10/27/2008   Influenza, High Dose Seasonal PF 10/28/2014, 09/28/2015, 10/18/2016, 11/19/2017   Influenza,inj,Quad PF,6+ Mos 10/05/2012, 10/05/2013   Influenza-Unspecified 10/21/2013   PFIZER(Purple Top)SARS-COV-2 Vaccination 02/11/2019, 03/04/2019, 09/04/2019, 03/11/2020   PNEUMOCOCCAL CONJUGATE-20 10/17/2021   Pfizer Covid-19 Vaccine Bivalent Booster 11yrs & up 11/06/2020, 10/10/2021   Pneumococcal Conjugate-13 03/01/2013, 10/21/2013   Pneumococcal Polysaccharide-23 05/31/2006   RSV,unspecified 10/10/2021   Respiratory Syncytial Virus Vaccine,Recomb Aduvanted(Arexvy) 10/10/2021   Td 11/24/1998, 03/01/2013   Zoster Recombinat (Shingrix) 10/31/2016, 05/01/2017   Zoster, Live 06/20/2006    TDAP status: Up to date  Flu Vaccine status: Up to date  Pneumococcal vaccine status: Up to  date  Covid-19 vaccine status: Completed vaccines  Qualifies for Shingles Vaccine? Yes   Zostavax completed Yes   Shingrix Completed?: Yes  Screening Tests Health Maintenance  Topic Date Due   COVID-19 Vaccine (7 - 2023-24 season) 12/05/2021   FOOT EXAM  02/16/2022   Diabetic kidney evaluation - Urine ACR  06/12/2022   HEMOGLOBIN A1C  07/10/2022   INFLUENZA VACCINE  08/22/2022   OPHTHALMOLOGY EXAM  01/03/2023   DTaP/Tdap/Td (3 - Tdap) 03/02/2023   Diabetic kidney evaluation - eGFR measurement  06/05/2023   Medicare Annual Wellness (AWV)  07/11/2023   Pneumonia Vaccine 6+ Years old  Completed   DEXA SCAN  Completed   Zoster Vaccines- Shingrix  Completed   HPV VACCINES  Aged Out    Health Maintenance  Health Maintenance Due  Topic Date Due   COVID-19 Vaccine (7 - 2023-24 season) 12/05/2021   FOOT EXAM  02/16/2022   Diabetic kidney evaluation - Urine ACR  06/12/2022   HEMOGLOBIN A1C  07/10/2022    Colorectal cancer screening: No longer required.   Mammogram status: No longer required due to per pt .  Bone Density status: Completed 02/19/22. Results reflect: Bone density results: OSTEOPOROSIS. Repeat every as directed  years.   Additional Screening:   Vision Screening: Recommended annual ophthalmology exams for early detection of glaucoma and other disorders of the eye. Is the patient up to date with their annual eye exam?  Yes  Who is the provider or what is the name of the office in which the patient attends annual eye exams? Dr Charlotte Sanes  If pt is not established with a provider, would they like to be referred to a provider to establish care? No .   Dental Screening: Recommended annual dental exams for proper oral hygiene  Diabetic Foot Exam: Diabetic Foot Exam: Overdue, Pt has been advised about the importance in completing this exam. Pt is scheduled for diabetic foot exam on next .  Community Resource Referral / Chronic Care Management: CRR required this visit?   No   CCM required this visit?  No     Plan:     I have personally reviewed and noted the following in the patient's chart:   Medical and social history Use of alcohol, tobacco or illicit drugs  Current medications and supplements including opioid prescriptions. Patient is currently taking opioid prescriptions. Information provided to patient  regarding non-opioid alternatives. Patient advised to discuss non-opioid treatment plan with their provider. Functional ability and status Nutritional status Physical activity Advanced directives List of other physicians Hospitalizations, surgeries, and ER visits in previous 12 months Vitals Screenings to include cognitive, depression, and falls Referrals and appointments  In addition, I have reviewed and discussed with patient certain preventive protocols, quality metrics, and best practice recommendations. A written personalized care plan for preventive services as well as general preventive health recommendations were provided to patient.     Marzella Schlein, LPN   1/61/0960   After Visit Summary: (Pick Up) Due to this being a telephonic visit, with patients personalized plan was offered to patient and patient has requested to Pick up at office.  Nurse Notes: none

## 2022-07-11 NOTE — Patient Instructions (Signed)
[name] , Thank you for taking time to come for your Medicare Wellness Visit. I appreciate your ongoing commitment to your health goals. Please review the following plan we discussed and let me know if I can assist you in the future.   These are the goals we discussed:  Goals      Exercise 150 minutes per week (moderate activity)     Will explore tai chi or yoga     Patient Stated     Lose weight      Track and Manage My Blood Pressure-Hypertension     Timeframe:  Long-Range Goal Priority:  High Start Date:  10/04/20                            Expected End Date: 04/03/21                      Follow Up Date 01/03/21    - check blood pressure weekly - choose a place to take my blood pressure (home, clinic or office, retail store) - write blood pressure results in a log or diary    Why is this important?   You won't feel high blood pressure, but it can still hurt your blood vessels.  High blood pressure can cause heart or kidney problems. It can also cause a stroke.  Making lifestyle changes like losing a little weight or eating less salt will help.  Checking your blood pressure at home and at different times of the day can help to control blood pressure.  If the doctor prescribes medicine remember to take it the way the doctor ordered.  Call the office if you cannot afford the medicine or if there are questions about it.     Notes:         This is a list of the screening recommended for you and due dates:  Health Maintenance  Topic Date Due   COVID-19 Vaccine (7 - 2023-24 season) 12/05/2021   Medicare Annual Wellness Visit  02/12/2022   Complete foot exam   02/16/2022   Yearly kidney health urinalysis for diabetes  06/12/2022   Hemoglobin A1C  07/10/2022   Flu Shot  08/22/2022   Eye exam for diabetics  01/03/2023   DTaP/Tdap/Td vaccine (3 - Tdap) 03/02/2023   Yearly kidney function blood test for diabetes  06/05/2023   Pneumonia Vaccine  Completed   DEXA scan (bone  density measurement)  Completed   Zoster (Shingles) Vaccine  Completed   HPV Vaccine  Aged Out    Advanced directives: Please bring a copy of your health care power of attorney and living will to the office at your convenience.  Conditions/risks identified: lose more weight   Next appointment: Follow up in one year for your annual wellness visit    Preventive Care 65 Years and Older, Female Preventive care refers to lifestyle choices and visits with your health care provider that can promote health and wellness. What does preventive care include? A yearly physical exam. This is also called an annual well check. Dental exams once or twice a year. Routine eye exams. Ask your health care provider how often you should have your eyes checked. Personal lifestyle choices, including: Daily care of your teeth and gums. Regular physical activity. Eating a healthy diet. Avoiding tobacco and drug use. Limiting alcohol use. Practicing safe sex. Taking low-dose aspirin every day. Taking vitamin and mineral supplements as recommended  by your health care provider. What happens during an annual well check? The services and screenings done by your health care provider during your annual well check will depend on your age, overall health, lifestyle risk factors, and family history of disease. Counseling  Your health care provider may ask you questions about your: Alcohol use. Tobacco use. Drug use. Emotional well-being. Home and relationship well-being. Sexual activity. Eating habits. History of falls. Memory and ability to understand (cognition). Work and work Astronomer. Reproductive health. Screening  You may have the following tests or measurements: Height, weight, and BMI. Blood pressure. Lipid and cholesterol levels. These may be checked every 5 years, or more frequently if you are over 60 years old. Skin check. Lung cancer screening. You may have this screening every year  starting at age 45 if you have a 30-pack-year history of smoking and currently smoke or have quit within the past 15 years. Fecal occult blood test (FOBT) of the stool. You may have this test every year starting at age 14. Flexible sigmoidoscopy or colonoscopy. You may have a sigmoidoscopy every 5 years or a colonoscopy every 10 years starting at age 44. Hepatitis C blood test. Hepatitis B blood test. Sexually transmitted disease (STD) testing. Diabetes screening. This is done by checking your blood sugar (glucose) after you have not eaten for a while (fasting). You may have this done every 1-3 years. Bone density scan. This is done to screen for osteoporosis. You may have this done starting at age 78. Mammogram. This may be done every 1-2 years. Talk to your health care provider about how often you should have regular mammograms. Talk with your health care provider about your test results, treatment options, and if necessary, the need for more tests. Vaccines  Your health care provider may recommend certain vaccines, such as: Influenza vaccine. This is recommended every year. Tetanus, diphtheria, and acellular pertussis (Tdap, Td) vaccine. You may need a Td booster every 10 years. Zoster vaccine. You may need this after age 39. Pneumococcal 13-valent conjugate (PCV13) vaccine. One dose is recommended after age 65. Pneumococcal polysaccharide (PPSV23) vaccine. One dose is recommended after age 55. Talk to your health care provider about which screenings and vaccines you need and how often you need them. This information is not intended to replace advice given to you by your health care provider. Make sure you discuss any questions you have with your health care provider. Document Released: 02/03/2015 Document Revised: 09/27/2015 Document Reviewed: 11/08/2014 Elsevier Interactive Patient Education  2017 [employer].  Fall Prevention in the Home Falls can cause injuries. They can happen to  people of all ages. There are many things you can do to make your home safe and to help prevent falls. What can I do on the outside of my home? Regularly fix the edges of walkways and driveways and fix any cracks. Remove anything that might make you trip as you walk through a door, such as a raised step or threshold. Trim any bushes or trees on the path to your home. Use bright outdoor lighting. Clear any walking paths of anything that might make someone trip, such as rocks or tools. Regularly check to see if handrails are loose or broken. Make sure that both sides of any steps have handrails. Any raised decks and porches should have guardrails on the edges. Have any leaves, snow, or ice cleared regularly. Use sand or salt on walking paths during winter. Clean up any spills in your garage right away.  This includes oil or grease spills. What can I do in the bathroom? Use night lights. Install grab bars by the toilet and in the tub and shower. Do not use towel bars as grab bars. Use non-skid mats or decals in the tub or shower. If you need to sit down in the shower, use a plastic, non-slip stool. Keep the floor dry. Clean up any water that spills on the floor as soon as it happens. Remove soap buildup in the tub or shower regularly. Attach bath mats securely with double-sided non-slip rug tape. Do not have throw rugs and other things on the floor that can make you trip. What can I do in the bedroom? Use night lights. Make sure that you have a light by your bed that is easy to reach. Do not use any sheets or blankets that are too big for your bed. They should not hang down onto the floor. Have a firm chair that has side arms. You can use this for support while you get dressed. Do not have throw rugs and other things on the floor that can make you trip. What can I do in the kitchen? Clean up any spills right away. Avoid walking on wet floors. Keep items that you use a lot in easy-to-reach  places. If you need to reach something above you, use a strong step stool that has a grab bar. Keep electrical cords out of the way. Do not use floor polish or wax that makes floors slippery. If you must use wax, use non-skid floor wax. Do not have throw rugs and other things on the floor that can make you trip. What can I do with my stairs? Do not leave any items on the stairs. Make sure that there are handrails on both sides of the stairs and use them. Fix handrails that are broken or loose. Make sure that handrails are as long as the stairways. Check any carpeting to make sure that it is firmly attached to the stairs. Fix any carpet that is loose or worn. Avoid having throw rugs at the top or bottom of the stairs. If you do have throw rugs, attach them to the floor with carpet tape. Make sure that you have a light switch at the top of the stairs and the bottom of the stairs. If you do not have them, ask someone to add them for you. What else can I do to help prevent falls? Wear shoes that: Do not have high heels. Have rubber bottoms. Are comfortable and fit you well. Are closed at the toe. Do not wear sandals. If you use a stepladder: Make sure that it is fully opened. Do not climb a closed stepladder. Make sure that both sides of the stepladder are locked into place. Ask someone to hold it for you, if possible. Clearly mark and make sure that you can see: Any grab bars or handrails. First and last steps. Where the edge of each step is. Use tools that help you move around (mobility aids) if they are needed. These include: Canes. Walkers. Scooters. Crutches. Turn on the lights when you go into a dark area. Replace any light bulbs as soon as they burn out. Set up your furniture so you have a clear path. Avoid moving your furniture around. If any of your floors are uneven, fix them. If there are any pets around you, be aware of where they are. Review your medicines with your doctor.  Some medicines can make you feel  dizzy. This can increase your chance of falling. Ask your doctor what other things that you can do to help prevent falls. This information is not intended to replace advice given to you by your health care provider. Make sure you discuss any questions you have with your health care provider. Document Released: 11/03/2008 Document Revised: 06/15/2015 Document Reviewed: 02/11/2014 Elsevier Interactive Patient Education  2017 [employer].

## 2022-07-11 NOTE — Progress Notes (Signed)
Phone [phone] In person visit   Subjective:   [name] is a 82 y.o. year old very pleasant female patient who presents for/with See problem oriented charting Chief Complaint  Patient presents with   Medical Management of Chronic Issues    Per Inetta Fermo pt states she does not want to do AWV any more b/c its a waste of time   Hypertension   Diabetes   Past Medical History-  Patient Active Problem List   Diagnosis Date Noted   Subdural hematoma (HCC) 02/27/2022    Priority: High   Hyperparathyroidism, primary (HCC) 03/14/2021    Priority: High   Osteoporosis 10/29/2020    Priority: High   History of polymyalgia rheumatica 03/16/2018    Priority: High   CAD (coronary artery disease) 11/19/2017    Priority: High   Hypercalcemia 12/29/2014    Priority: High   OA (osteoarthritis) of knee 10/05/2013    Priority: High   Well controlled type 2 diabetes mellitus (HCC) 07/18/2006    Priority: High   Aortic atherosclerosis (HCC) 02/16/2021    Priority: Medium    Mild aortic stenosis 11/16/2020    Priority: Medium    B12 deficiency 05/31/2019    Priority: Medium    Overactive bladder 09/03/2016    Priority: Medium    Morbid obesity (HCC) 06/28/2015    Priority: Medium    History of gastric bypass 02/08/2014    Priority: Medium    Hyperlipidemia associated with type 2 diabetes mellitus (HCC) 01/05/2008    Priority: Medium    Hypothyroidism 09/09/2007    Priority: Medium    Major depression, recurrent, full remission (HCC) 07/18/2006    Priority: Medium    Essential hypertension 07/18/2006    Priority: Medium    Asthma 07/18/2006    Priority: Medium    Primary osteoarthritis of right knee 06/28/2019    Priority: 1.   CHEST WALL PAIN, ANTERIOR 10/09/2007    Priority: 1.   Allergic rhinitis 07/20/2014    Priority: Low    Medications- reviewed and updated Current Outpatient Medications  Medication Sig Dispense Refill   albuterol (VENTOLIN HFA) 108 (90 Base)  MCG/ACT inhaler Inhale 1-2 puffs into the lungs every 4 (four) hours as needed for wheezing or shortness of breath. 18 g 2   amLODipine (NORVASC) 10 MG tablet TAKE 1 TABLET EVERY DAY 90 tablet 2   Biotin 5000 MCG CAPS Take 5,000 mcg by mouth daily.     Blood Glucose Monitoring Suppl (TRUE METRIX AIR GLUCOSE METER) DEVI Use to test blood sugars daily. Dx: E11.9 1 each 5   Cholecalciferol (VITAMIN D-3) 125 MCG (5000 UT) TABS Take 5,000 Units by mouth daily.     cyanocobalamin (,VITAMIN B-12,) 1000 MCG/ML injection INJECT (1,000 MCG TOTAL)  INTO THE MUSCLE EVERY 30 DAYS USE ON A MONTHLY BASES 10 mL 0   desvenlafaxine (PRISTIQ) 100 MG 24 hr tablet TAKE 1 TABLET EVERY DAY 90 tablet 3   diclofenac (VOLTAREN) 75 MG EC tablet Take 1 tablet (75 mg total) by mouth 2 (two) times daily. 180 tablet 3   Evolocumab (REPATHA SURECLICK) 140 MG/ML SOAJ INJECT 1 PEN SUBCUTANEOUSLY EVERY TWO WEEKS (Patient taking differently: Inject 140 mg into the skin every 14 (fourteen) days.) 6 mL 3   FeFum-FePo-FA-B Cmp-C-Zn-Mn-Cu (SE-TAN PLUS) 162-115.2-1 MG CAPS TAKE 1 CAPSULE BY MOUTH TWICE A DAY (Patient taking differently: Take 1 capsule by mouth daily.) 180 capsule 0   fluticasone (FLONASE) 50 MCG/ACT nasal spray Place  1 spray into both nostrils daily.     glucose blood test strip Test blood sugar once per day 100 each 3   hydrochlorothiazide (HYDRODIURIL) 12.5 MG tablet Take 1 tablet (12.5 mg total) by mouth daily. 90 tablet 3   Levothyroxine Sodium 150 MCG CAPS Take 150 mcg by mouth daily.     loperamide (IMODIUM) 2 MG capsule Take 4 mg by mouth daily as needed for diarrhea or loose stools.      Omega-3 Fatty Acids (FISH OIL) 1000 MG CAPS Take 1,000 mg by mouth daily.     Propylene Glycol (SYSTANE COMPLETE OP) Place 1 drop into both eyes in the morning.     TRUEplus Lancets 30G MISC Check blood sugar once per day 100 each 3   HYDROcodone-acetaminophen (NORCO/VICODIN) 5-325 MG tablet Take 2 tablets by mouth every 6  (six) hours as needed for moderate pain. 30 tablet 0   nitroGLYCERIN (NITROSTAT) 0.4 MG SL tablet Place 1 tablet (0.4 mg total) under the tongue every 5 (five) minutes as needed for chest pain. (Patient not taking: Reported on 07/11/2022) 50 tablet 3   No current facility-administered medications for this visit.     Objective:  BP (!) 140/80   Pulse 65   Temp 98.7 F (37.1 C)   Ht 5\' 1"  (1.549 m)   Wt 216 lb (98 kg)   SpO2 94%   BMI 40.81 kg/m  Gen: NAD, resting comfortably CV: RRR no murmurs rubs or gallops Lungs: CTAB no crackles, wheeze, rhonchi Ext: trace to 1+ edema Skin: warm, dry  Diabetic Foot Exam - Simple   Simple Foot Form Diabetic Foot exam was performed with the following findings: Yes 07/11/2022  9:50 AM  Visual Inspection No deformities, no ulcerations, no other skin breakdown bilaterally: Yes Sensation Testing Intact to touch and monofilament testing bilaterally: Yes Pulse Check Posterior Tibialis and Dorsalis pulse intact bilaterally: Yes Comments Other than baseline loss of monofilament in left foot towards heel from prior surgery       Assessment and Plan   #Severe osteoarthritis of knee- opts out of surgery- very sparing hydrocodone 5-325 mg. Uses diclofenac so we have to watch renal function - hydrocodone use was higher with prior fall/injury as well as just flare of knee pain- needs refill at this time. Also bothered her back with prior fall  # Diabetes S: Medication: no prescription- Ozempic too costly but was losing weight Exercise and diet- down 2 lbs. But just stopped Ozempic 2-3 weeks ago and appetite up Lab Results  Component Value Date   HGBA1C 6.7 (H) 01/08/2022   HGBA1C 6.4 06/11/2021   HGBA1C 6.7 (H) 02/19/2021  A/P: hopefully stable- update a1c today. Continue without meds for now  Morbid obesity noted- wish Ozempic was affordable- focus on healthy eating and regular exercise   #history of substantial subdural hematoma/fall in  February 2024 - later had trip in may and had another Emergency Department visit- was on loose rug which she has removed from the home- stressed importance of fall prevention- using cane -feels like memory has improved as gotten further out from accident  #hypertension S: medication: amlodipine 10 mg Home readings #s: no recent checks BP Readings from Last 3 Encounters:  07/11/22 (!) 140/80  07/11/22 (!) 140/80  06/05/22 (!) 159/78  A/P: blood pressure above goal most of this year- continue amlodipine. add hydrochlorothiazide 12.5 mg. Come back in 2 weeks for repeat potassium/kidney function check- have to monitor your sodium which is slightly  low and calcium -beta blockers not good option as well as ace-I and ARB with prior medication reactions - is on low dose calcium now after prior parathyroidectomy  #hyperlipidemia S: Medication: Repatha 140 mg q2 weeks Lab Results  Component Value Date   CHOL 155 01/08/2022   HDL 59.90 01/08/2022   LDLCALC 62 01/08/2022   LDLDIRECT 60.0 05/31/2019   TRIG 76 03/01/2022   CHOLHDL 3 01/08/2022  A/P: #s look great- continue current medications   #hypothyroidism- [facility] Janee Morn S: compliant On thyroid medication- levothyroxine 150 mcg - is to not take on first day of month A/P:just checked by Dr. Janee Morn  and had above adjustment as TSH was increasing but still in normal range- has September recheck planned. TSH ok but T4 slightly high  # B12 deficiency S: Current treatment/medication (oral vs. IM):  1000 mcg injections at home in past- since above normal she stopped - no recent use Lab Results  Component Value Date   VITAMINB12 729 01/08/2022  A/P: stable off medications last visit- update without medications again today  # Depression S: Medication: Pristiq 100 mg    07/11/2022    8:43 AM 07/11/2022    8:24 AM 03/26/2022   10:59 AM  Depression screen PHQ 2/9  Decreased Interest  0 0  Down, Depressed, Hopeless 0 1 1  PHQ  - 2 Score 0 1 1  Altered sleeping 0  2  Tired, decreased energy 0  3  Change in appetite 0  0  Feeling bad or failure about yourself  0  0  Trouble concentrating 0  3  Moving slowly or fidgety/restless 0  3  Suicidal thoughts 0  0  PHQ-9 Score 0  12  Difficult doing work/chores Not difficult at all  Somewhat difficult  A/P: full remission- continue current medications   Recommended follow up: Return in about 6 months (around 01/10/2023) for physical or sooner if needed.Schedule b4 you leave. Future Appointments  Date Time Provider Department Center  09/11/2022 10:30 AM Antony Madura, MD [facility] None  10/18/2022  9:00 AM Meriam Sprague, MD [facility]   Lab/Order associations:   ICD-10-CM   1. Well controlled type 2 diabetes mellitus (HCC)  E11.9 Urine Microalbumin w/creat. ratio    Hemoglobin A1c    Comprehensive metabolic panel    2. Essential hypertension  I10     3. Hypothyroidism, unspecified type  E03.9     4. Hyperlipidemia associated with type 2 diabetes mellitus (HCC)  E11.69    E78.5     5. Major depression, recurrent, full remission (HCC)  F33.42     6. Morbid obesity (HCC) Chronic E66.01     7. Hyperparathyroidism, primary (HCC) Chronic E21.0     8. B12 deficiency  E53.8 Vitamin B12     Meds ordered this encounter  Medications   HYDROcodone-acetaminophen (NORCO/VICODIN) 5-325 MG tablet    Sig: Take 2 tablets by mouth every 6 (six) hours as needed for moderate pain.    Dispense:  30 tablet    Refill:  0   hydrochlorothiazide (HYDRODIURIL) 12.5 MG tablet    Sig: Take 1 tablet (12.5 mg total) by mouth daily.    Dispense:  90 tablet    Refill:  3    Return precautions advised.  Tana Conch, MD

## 2022-07-11 NOTE — Patient Instructions (Addendum)
Let us know if you get any COVID vaccines this fall.  blood pressure above goal most of this year- continue amlodipine. add hydrochlorothiazide 12.5 mg. Come back in 2 weeks for repeat potassium/kidney function check- have to monitor your sodium which is slightly low and calcium -beta blockers not good option as well as ace-I and ARB with prior medication reactions  Schedule lab on way out for 2 weeks- drop off home blood pressures when you come by   Please stop by lab before you go If you have mychart- we will send your results within 3 business days of [place] receiving them.  If you do not have mychart- we will call you about results within 5 business days of [place] receiving them.  *please also note that you will see labs on mychart as soon as they post. I will later go in and write notes on them- will say "notes from Dr. Durene Cal"   Recommended follow up: Return in about 6 months (around 01/10/2023) for physical or sooner if needed.Schedule b4 you leave.

## 2022-07-19 ENCOUNTER — Telehealth: Payer: Self-pay | Admitting: Pharmacy Technician

## 2022-07-19 DIAGNOSIS — Z5986 Financial insecurity: Secondary | ICD-10-CM

## 2022-07-19 DIAGNOSIS — H903 Sensorineural hearing loss, bilateral: Secondary | ICD-10-CM | POA: Diagnosis not present

## 2022-07-19 DIAGNOSIS — H6123 Impacted cerumen, bilateral: Secondary | ICD-10-CM | POA: Diagnosis not present

## 2022-07-19 NOTE — Progress Notes (Signed)
  [facility] Customer service manager [facility])  [facility] Quality Pharmacy Team   07/19/2022  [name] 1940/08/03 [id number]  Reason for referral: Medication assistance  Referral source:  Dylan Rosezetta Schlatter, CPhT [employer] HMO MAD reort Current insurance: [employer]  Outreach:  Successful telephone call with patient.  HIPAA identifiers verified. Patient informs she is a household of 1 and makes below 400%FPL and above 200%FPL and therefore seems to qualify for [employer] patient assistance foundation for [employer].   Medication Assistance Findings:  Medication assistance needs identified: Ozempic  Extra Help:  Not eligible for Extra Help Low Income Subsidy based on reported income and assets  Additional medication assistance options reviewed with patient as warranted:  No other options identified  Plan: I will route patient assistance letter to [facility] pharmacy technician who will coordinate patient assistance program application process for medications listed above.  [facility] pharmacy technician will assist with obtaining all required documents from both patient and provider(s) and submit application(s) once completed.  Thank you for allowing [facility] pharmacy to be a part of this patient's care.   [name], CPhT [place]  [facility] Office: [phone] Fax: [phone] Email: [name]

## 2022-07-26 ENCOUNTER — Other Ambulatory Visit (INDEPENDENT_AMBULATORY_CARE_PROVIDER_SITE_OTHER): Payer: Medicare HMO

## 2022-07-26 ENCOUNTER — Encounter: Payer: Self-pay | Admitting: Family Medicine

## 2022-07-26 DIAGNOSIS — I10 Essential (primary) hypertension: Secondary | ICD-10-CM | POA: Diagnosis not present

## 2022-07-26 LAB — BASIC METABOLIC PANEL
BUN: 15 mg/dL (ref 6–23)
CO2: 28 mEq/L (ref 19–32)
Calcium: 8.7 mg/dL (ref 8.4–10.5)
Chloride: 95 mEq/L — ABNORMAL LOW (ref 96–112)
Creatinine, Ser: 0.86 mg/dL (ref 0.40–1.20)
GFR: 62.94 mL/min (ref >60.00)
Glucose, Bld: 151 mg/dL — ABNORMAL HIGH (ref 70–99)
Potassium: 3.9 mEq/L (ref 3.5–5.1)
Sodium: 131 mEq/L — ABNORMAL LOW (ref 135–145)

## 2022-07-29 NOTE — Telephone Encounter (Signed)
FYI

## 2022-08-01 ENCOUNTER — Telehealth: Payer: Self-pay | Admitting: Pharmacy Technician

## 2022-08-01 DIAGNOSIS — Z5986 Financial insecurity: Secondary | ICD-10-CM

## 2022-08-01 NOTE — Progress Notes (Signed)
[facility] Customer service manager [facility])                                            [facility] Quality Pharmacy Team    08/01/2022  [name] 1941-01-07 [id number]                                      Medication Assistance Referral  Referral From: Richardson Dopp, CPhT [employer] HMO MAD reort   Medication/Company: [name] / [employer] Patient application portion:  Mining engineer portion: Faxed  to Dr. Tana Conch Provider address/fax verified via: Office website  Pattricia Boss, CPhT [place]  [facility] Office: [phone] Fax: [phone] Email: [name]

## 2022-08-02 ENCOUNTER — Encounter: Payer: Self-pay | Admitting: Family Medicine

## 2022-08-05 DIAGNOSIS — Z96612 Presence of left artificial shoulder joint: Secondary | ICD-10-CM | POA: Diagnosis not present

## 2022-08-05 DIAGNOSIS — M25511 Pain in right shoulder: Secondary | ICD-10-CM | POA: Diagnosis not present

## 2022-08-12 ENCOUNTER — Other Ambulatory Visit (HOSPITAL_COMMUNITY): Payer: Self-pay

## 2022-08-15 ENCOUNTER — Telehealth: Payer: Self-pay | Admitting: Pharmacy Technician

## 2022-08-15 DIAGNOSIS — Z5986 Financial insecurity: Secondary | ICD-10-CM

## 2022-08-15 NOTE — Progress Notes (Signed)
[facility])                                            [facility] Quality Pharmacy Team    08/15/2022  [name] January 02, 1941 [id number]  Received patient and provider portion(s) of patient assistance application(s) for Ozempic. Faxed completed application and required documents into [employer].   Pattricia Boss, CPhT [place]  [facility] Office: [phone] Fax: [phone] Email: [name]

## 2022-08-19 ENCOUNTER — Telehealth: Payer: Self-pay | Admitting: Pharmacy Technician

## 2022-08-19 DIAGNOSIS — Z5986 Financial insecurity: Secondary | ICD-10-CM

## 2022-08-19 NOTE — Progress Notes (Signed)
[facility] Customer service manager [facility])                                            [facility] Quality Pharmacy Team    08/19/2022  KEARSTEN EAVES [birth year] [phone]  Care coordination call placed to Novo Nordisk in regard to Ozempic application.  Spoke to [employer] who informs patient is APPROVED7/29/24-12/31/24. Medication and subsequent refills will be processed automatically when refills are due and sent to the prescriber's office. Initial shipment will also be process automatically and shipped to prescriber's office. Patient may call Novo Nordisk at [phone] if she feels current supply is not sufficient until next auto shipment arrives.  Pattricia Boss, CPhT [place]  [facility] Office: [phone] Fax: [phone] Email: [email or web address]

## 2022-08-20 ENCOUNTER — Other Ambulatory Visit: Payer: Self-pay

## 2022-08-20 DIAGNOSIS — I10 Essential (primary) hypertension: Secondary | ICD-10-CM

## 2022-08-22 ENCOUNTER — Ambulatory Visit (INDEPENDENT_AMBULATORY_CARE_PROVIDER_SITE_OTHER): Payer: Medicare HMO | Admitting: Family Medicine

## 2022-08-22 ENCOUNTER — Encounter: Payer: Self-pay | Admitting: Family Medicine

## 2022-08-22 VITALS — BP 130/70 | HR 68 | Temp 98.2°F | Ht 61.0 in | Wt 221.4 lb

## 2022-08-22 DIAGNOSIS — N3000 Acute cystitis without hematuria: Secondary | ICD-10-CM | POA: Diagnosis not present

## 2022-08-22 DIAGNOSIS — I10 Essential (primary) hypertension: Secondary | ICD-10-CM

## 2022-08-22 DIAGNOSIS — R300 Dysuria: Secondary | ICD-10-CM

## 2022-08-22 DIAGNOSIS — E063 Autoimmune thyroiditis: Secondary | ICD-10-CM | POA: Diagnosis not present

## 2022-08-22 DIAGNOSIS — E038 Other specified hypothyroidism: Secondary | ICD-10-CM | POA: Diagnosis not present

## 2022-08-22 LAB — POC URINALSYSI DIPSTICK (AUTOMATED)
Bilirubin, UA: NEGATIVE
Blood, UA: NEGATIVE
Glucose, UA: NEGATIVE
Ketones, UA: NEGATIVE
Leukocytes, UA: NEGATIVE
Nitrite, UA: NEGATIVE
Protein, UA: NEGATIVE
Spec Grav, UA: 1.01 (ref 1.010–1.025)
Urobilinogen, UA: 0.2 E.U./dL
pH, UA: 7.5 (ref 5.0–8.0)

## 2022-08-22 MED ORDER — CEPHALEXIN 500 MG PO CAPS: 500.0000 mg | ORAL_CAPSULE | Freq: Three times a day (TID) | ORAL | 0 refills | Status: AC

## 2022-08-22 NOTE — Progress Notes (Signed)
Phone [phone] In person visit   Subjective:   [name] is a 82 y.o. year old very pleasant female patient who presents for/with See problem oriented charting Chief Complaint  Patient presents with   burning with urination    Pt c/o burning with urination and cloudy urine.   Back Pain    Past Medical History-  Patient Active Problem List   Diagnosis Date Noted   Subdural hematoma (HCC) 02/27/2022    Priority: High   Hyperparathyroidism, primary (HCC) 03/14/2021    Priority: High   Osteoporosis 10/29/2020    Priority: High   History of polymyalgia rheumatica 03/16/2018    Priority: High   CAD (coronary artery disease) 11/19/2017    Priority: High   Hypercalcemia 12/29/2014    Priority: High   OA (osteoarthritis) of knee 10/05/2013    Priority: High   Well controlled type 2 diabetes mellitus (HCC) 07/18/2006    Priority: High   Aortic atherosclerosis (HCC) 02/16/2021    Priority: Medium    Mild aortic stenosis 11/16/2020    Priority: Medium    B12 deficiency 05/31/2019    Priority: Medium    Overactive bladder 09/03/2016    Priority: Medium    Morbid obesity (HCC) 06/28/2015    Priority: Medium    History of gastric bypass 02/08/2014    Priority: Medium    Hyperlipidemia associated with type 2 diabetes mellitus (HCC) 01/05/2008    Priority: Medium    Hypothyroidism 09/09/2007    Priority: Medium    Major depression, recurrent, full remission (HCC) 07/18/2006    Priority: Medium    Essential hypertension 07/18/2006    Priority: Medium    Asthma 07/18/2006    Priority: Medium    Primary osteoarthritis of right knee 06/28/2019    Priority: 1.   CHEST WALL PAIN, ANTERIOR 10/09/2007    Priority: 1.   Allergic rhinitis 07/20/2014    Priority: Low    Medications- reviewed and updated Current Outpatient Medications  Medication Sig Dispense Refill   albuterol (VENTOLIN HFA) 108 (90 Base) MCG/ACT inhaler Inhale 1-2 puffs into the lungs every 4  (four) hours as needed for wheezing or shortness of breath. 18 g 2   amLODipine (NORVASC) 10 MG tablet TAKE 1 TABLET EVERY DAY 90 tablet 2   Biotin 5000 MCG CAPS Take 5,000 mcg by mouth daily.     Blood Glucose Monitoring Suppl (TRUE METRIX AIR GLUCOSE METER) DEVI Use to test blood sugars daily. Dx: E11.9 1 each 5   Cholecalciferol (VITAMIN D-3) 125 MCG (5000 UT) TABS Take 5,000 Units by mouth daily.     cyanocobalamin (,VITAMIN B-12,) 1000 MCG/ML injection INJECT (1,000 MCG TOTAL)  INTO THE MUSCLE EVERY 30 DAYS USE ON A MONTHLY BASES 10 mL 0   desvenlafaxine (PRISTIQ) 100 MG 24 hr tablet TAKE 1 TABLET EVERY DAY 90 tablet 3   diclofenac (VOLTAREN) 75 MG EC tablet Take 1 tablet (75 mg total) by mouth 2 (two) times daily. 180 tablet 3   Evolocumab (REPATHA SURECLICK) 140 MG/ML SOAJ INJECT 1 PEN SUBCUTANEOUSLY EVERY TWO WEEKS (Patient taking differently: Inject 140 mg into the skin every 14 (fourteen) days.) 6 mL 3   FeFum-FePo-FA-B Cmp-C-Zn-Mn-Cu (SE-TAN PLUS) 162-115.2-1 MG CAPS TAKE 1 CAPSULE BY MOUTH TWICE A DAY (Patient taking differently: Take 1 capsule by mouth daily.) 180 capsule 0   fluticasone (FLONASE) 50 MCG/ACT nasal spray Place 1 spray into both nostrils daily.     glucose blood test strip  Test blood sugar once per day 100 each 3   hydrochlorothiazide (HYDRODIURIL) 12.5 MG tablet Take 1 tablet (12.5 mg total) by mouth daily. 90 tablet 3   Levothyroxine Sodium 150 MCG CAPS Take 150 mcg by mouth daily.     loperamide (IMODIUM) 2 MG capsule Take 4 mg by mouth daily as needed for diarrhea or loose stools.      nitroGLYCERIN (NITROSTAT) 0.4 MG SL tablet Place 1 tablet (0.4 mg total) under the tongue every 5 (five) minutes as needed for chest pain. 50 tablet 3   Omega-3 Fatty Acids (FISH OIL) 1000 MG CAPS Take 1,000 mg by mouth daily.     Propylene Glycol (SYSTANE COMPLETE OP) Place 1 drop into both eyes in the morning.     TRUEplus Lancets 30G MISC Check blood sugar once per day 100 each  3   HYDROcodone-acetaminophen (NORCO/VICODIN) 5-325 MG tablet Take 2 tablets by mouth every 6 (six) hours as needed for moderate pain. (Patient not taking: Reported on 08/22/2022) 30 tablet 0   No current facility-administered medications for this visit.     Objective:  BP 130/70   Pulse 68   Temp 98.2 F (36.8 C)   Ht 5\' 1"  (1.549 m)   Wt 221 lb 6.4 oz (100.4 kg)   SpO2 97%   BMI 41.83 kg/m  Gen: NAD, resting comfortably CV: RRR no murmurs rubs or gallops Lungs: CTAB no crackles, wheeze, rhonchi Abdomen: soft/nontender other than mild tenderness in lower abdomen/nondistended/normal bowel sounds. No rebound or guarding.  Ext: no edema Skin: warm, dry  No results found for this or any previous visit (from the past 24 hour(s)).     Assessment and Plan   #Concern for UTI S: Patients symptoms started about 2 days ago.  Complains of dysuria: YES as well as cloudy urine; polyuria: YES; nocturia: YES but on hctz; urgency: YES.  Also notes back pain with this Symptoms are worsening slightly as far as frequency but burning slightly better.  ROS- no fever, chills, nausea, vomiting, No blood in urine.  A/P: UA reassuring but From symptoms Likely UTI. Will get culture. Empiric treatment with: keflex Patient to follow up if new or worsening symptoms or failure to improve.   #hypertension S: medication: amlodipine 10 mg, hydrochlorothiazide 12.5 mg- no cramps since restarting but added magnesium BP Readings from Last 3 Encounters:  08/22/22 130/70  07/11/22 (!) 140/80  07/11/22 (!) 140/80  A/P: blood pressure looks better on hydrochlorothiazide and continued amlodipine 10 mg - will come  Recommended follow up: Return for next already scheduled visit or sooner if needed. Future Appointments  Date Time Provider Department Center  09/03/2022 10:15 AM [facility]  09/11/2022 10:30 AM Antony Madura, MD [facility] None  11/18/2022  8:00 AM Nahser, Deloris Ping, MD [facility]  01/21/2023  9:00 AM Shelva Majestic, MD [facility]   Lab/Order associations:   ICD-10-CM   1. Burning with urination  R30.0 Urine Culture    POCT Urinalysis Dipstick (Automated)    Urine Culture    2. Acute cystitis without hematuria  N30.00     3. Essential hypertension  I10       Meds ordered this encounter  Medications   cephALEXin (KEFLEX) 500 MG capsule    Sig: Take 1 capsule (500 mg total) by mouth 3 (three) times daily for 7 days.    Dispense:  21 capsule    Refill:  0    Return  precautions advised.  Tana Conch, MD

## 2022-08-22 NOTE — Patient Instructions (Addendum)
UA reassuring but From symptoms Likely UTI. Will get culture. Empiric treatment with: keflex Patient to follow up if new or worsening symptoms or failure to improve.   Recommended follow up: Return for next already scheduled visit or sooner if needed.

## 2022-08-26 ENCOUNTER — Encounter: Payer: Self-pay | Admitting: Family Medicine

## 2022-08-26 MED ORDER — NITROFURANTOIN MONOHYD MACRO 100 MG PO CAPS: 100.0000 mg | ORAL_CAPSULE | Freq: Two times a day (BID) | ORAL | 0 refills | Status: AC

## 2022-08-31 ENCOUNTER — Other Ambulatory Visit: Payer: Self-pay | Admitting: Family Medicine

## 2022-08-31 ENCOUNTER — Encounter: Payer: Self-pay | Admitting: Family Medicine

## 2022-09-02 MED ORDER — SE-TAN PLUS 162-115.2-1 MG PO CAPS: 1.0000 | ORAL_CAPSULE | Freq: Two times a day (BID) | ORAL | 3 refills | Status: DC

## 2022-09-03 ENCOUNTER — Other Ambulatory Visit (INDEPENDENT_AMBULATORY_CARE_PROVIDER_SITE_OTHER): Payer: Medicare HMO

## 2022-09-03 DIAGNOSIS — I10 Essential (primary) hypertension: Secondary | ICD-10-CM

## 2022-09-03 LAB — BASIC METABOLIC PANEL
BUN: 11 mg/dL (ref 6–23)
CO2: 31 mEq/L (ref 19–32)
Calcium: 8.9 mg/dL (ref 8.4–10.5)
Chloride: 95 mEq/L — ABNORMAL LOW (ref 96–112)
Creatinine, Ser: 0.94 mg/dL (ref 0.40–1.20)
GFR: 56.53 mL/min — ABNORMAL LOW (ref >60.00)
Glucose, Bld: 154 mg/dL — ABNORMAL HIGH (ref 70–99)
Potassium: 4.1 mEq/L (ref 3.5–5.1)
Sodium: 134 mEq/L — ABNORMAL LOW (ref 135–145)

## 2022-09-03 LAB — MAGNESIUM: Magnesium: 1.9 mg/dL (ref 1.5–2.5)

## 2022-09-03 NOTE — Progress Notes (Signed)
NEUROLOGY FOLLOW UP OFFICE NOTE  [name] [id number]  Subjective:  [name] is a 82 y.o. year old right-handed female with a medical history of DM, degenerative lumbar disease s/p surgery, HTN, CAD, hypothyroidism, polymyalgia rheumatica, depression, and asthma who we last saw on 03/13/22.  To briefly review: Patient is having a headache on the right back part of her head and extending down into her neck. She does have some neck pain on her head. Patient had a fall after she tripped on 02/27/22. She had a headache afterward. Patient presented to the ED and CT head showed a left subdural hematoma, interhemispheric hematoma, and right parietal scalp hematoma. There was no midline shift. Repeat scan at 6 hours was stable. NSGY did not recommend surgical intervention. Patient was started on keppra for 7 days per NSGY. Since discharge, patient continues to have headaches. She rates the pain 5/10 currently. She is taking Norco 1-2 per day and 500 mg tylenol. It takes about 1.5 for pain to improve, but does help for several hours.   Patient walks with a walker or cane at baseline since bilateral knee surgeries (3-4 years ago was most recent). She has chronic back pain and imbalance, but feels it may have been worse since her fall.   She is regaining independence and mood has improved since the initial fall. She is doing home PT twice per week.   Of note, patient is on desvenlafaxine 100 mg daily for depression. Patient was also previously on aspirin 81 mg daily for "preventative reasons". This was stopped after her SDH.  Most recent Assessment and Plan (03/13/22): [name] is a 82 y.o. female who presents for evaluation of post traumatic headache after fall and sub dural hematoma. She has a relevant medical history of DM, degenerative lumbar disease s/p surgery, HTN, CAD, hypothyroidism, polymyalgia rheumatica, depression, and asthma. her neurological examination is pertinent  for antalgic gait from chronic back pain, but otherwise intact with no focal neurologic deficits. The etiology of patient's head pain is likely a combination of the SDH and scalp hematoma. She appears to be improving already as she is needed less Norco for pain control. In terms of expected time to heal, I estimated 3-6 months to patient, though she could have residual headaches going forward. I do expect her symptoms to be much improved over the coming months though. I would not add more medication at this time as this might cause more imbalance and increase chances of another fall.   PLAN: -Continue PT  -Okay to continue tylenol and Norco as needed -No objection to aspirin 81 mg daily, but no clear indication per patient, so would not restart if not needed -Over the counter options for headache given  Since their last visit: Patient's headaches have resolved (went away not long after last clinic visit).  Patient has had 2 falls since her last visit. One she tripped over a rug on 06/05/22. She has since gotten rid of this rug. She tripped and fell at home alone. She pressed her life alert and EMS brought patient to the ED. She endorsed left hip and thigh pain and some chest pain. CT head and cervical spine were unremarkable for acute process. CT chest/abdomen/pelvis was significant for left hip subcutaneous tissue hematoma. Patient declined pain medications and was discharged home. The other fall was minor and did not require medication attention.  Patient again mentions she has imbalance and thinks this contributes to her falls. She has  went to PT in the past and it did not help. When she has fallen, it was when she is not using her cane or walker.  MEDICATIONS:  Outpatient Encounter Medications as of 09/11/2022  Medication Sig   albuterol (VENTOLIN HFA) 108 (90 Base) MCG/ACT inhaler Inhale 1-2 puffs into the lungs every 4 (four) hours as needed for wheezing or shortness of breath.   amLODipine  (NORVASC) 10 MG tablet TAKE 1 TABLET EVERY DAY   Biotin 5000 MCG CAPS Take 5,000 mcg by mouth daily.   Blood Glucose Monitoring Suppl (TRUE METRIX AIR GLUCOSE METER) DEVI Use to test blood sugars daily. Dx: E11.9   Cholecalciferol (VITAMIN D-3) 125 MCG (5000 UT) TABS Take 5,000 Units by mouth daily.   cyanocobalamin (,VITAMIN B-12,) 1000 MCG/ML injection INJECT (1,000 MCG TOTAL)  INTO THE MUSCLE EVERY 30 DAYS USE ON A MONTHLY BASES   desvenlafaxine (PRISTIQ) 100 MG 24 hr tablet TAKE 1 TABLET EVERY DAY   diclofenac (VOLTAREN) 75 MG EC tablet Take 1 tablet (75 mg total) by mouth 2 (two) times daily.   Evolocumab (REPATHA SURECLICK) 140 MG/ML SOAJ INJECT 1 PEN SUBCUTANEOUSLY EVERY TWO WEEKS (Patient taking differently: Inject 140 mg into the skin every 14 (fourteen) days.)   FeFum-FePo-FA-B Cmp-C-Zn-Mn-Cu (SE-TAN PLUS) 162-115.2-1 MG CAPS Take 1 capsule by mouth 2 (two) times daily.   fluticasone (FLONASE) 50 MCG/ACT nasal spray Place 1 spray into both nostrils daily.   glucose blood test strip Test blood sugar once per day   HYDROcodone-acetaminophen (NORCO/VICODIN) 5-325 MG tablet Take 2 tablets by mouth every 6 (six) hours as needed for moderate pain.   Levothyroxine Sodium 150 MCG CAPS Take 150 mcg by mouth daily.   loperamide (IMODIUM) 2 MG capsule Take 4 mg by mouth daily as needed for diarrhea or loose stools.    Omega-3 Fatty Acids (FISH OIL) 1000 MG CAPS Take 1,000 mg by mouth daily.   Propylene Glycol (SYSTANE COMPLETE OP) Place 1 drop into both eyes in the morning.   Semaglutide (OZEMPIC, 0.25 OR 0.5 MG/DOSE, [place]) Inject into the skin.   TRUEplus Lancets 30G MISC Check blood sugar once per day   nitroGLYCERIN (NITROSTAT) 0.4 MG SL tablet Place 1 tablet (0.4 mg total) under the tongue every 5 (five) minutes as needed for chest pain. (Patient not taking: Reported on 09/11/2022)   No facility-administered encounter medications on file as of 09/11/2022.    PAST MEDICAL HISTORY: Past  Medical History:  Diagnosis Date   Anemia    Anxiety    Arthritis    osteoarthritis - shoulder, knees & hips   Asthma    Coronary artery disease    Depression    Diabetes mellitus without complication (HCC)    no meds   Fibromyalgia    Heart murmur    Hyperparathyroidism (HCC)    Hypertension    Dr. Terrilee Files manages BP, pt. reports MD has not found a need for treatment    Hypothyroidism    Polymyalgia rheumatica (HCC)    Sleep difficulties    had sleep study -2009, prior to gastric surgery, told that there was not a need for f/u   Thyroid disease    Vitamin B 12 deficiency     PAST SURGICAL HISTORY: Past Surgical History:  Procedure Laterality Date   ABDOMINAL HYSTERECTOMY     BARIATRIC SURGERY     CARDIAC CATHETERIZATION     [facility]- 30 yrs. ago   CATARACT EXTRACTION, BILATERAL  late 2018   LEFT HEART CATH AND CORONARY ANGIOGRAPHY N/A 04/02/2018   Procedure: LEFT HEART CATH AND CORONARY ANGIOGRAPHY;  Surgeon: [place]az[place], Peter M, MD;  Location: [facility] INVASIVE CV LAB;  Service: Cardiovascular;  Laterality: N/A;   OOPHORECTOMY     pantallor arthrodesis with rod placement left foot     PARATHYROIDECTOMY Left 03/16/2021   Procedure: LEFT INFERIOR PARATHYROIDECTOMY;  Surgeon: Darnell Level, MD;  Location: [facility] ORS;  Service: General;  Laterality: Left;   TONSILLECTOMY     TOTAL KNEE ARTHROPLASTY Left 11/30/2018   Procedure: TOTAL KNEE ARTHROPLASTY;  Surgeon: Ollen Gross, MD;  Location: [facility] ORS;  Service: Orthopedics;  Laterality: Left;   TOTAL KNEE ARTHROPLASTY Right 06/28/2019   Procedure: TOTAL KNEE ARTHROPLASTY;  Surgeon: Ollen Gross, MD;  Location: [facility] ORS;  Service: Orthopedics;  Laterality: Right;    TOTAL SHOULDER ARTHROPLASTY Left 03/12/2012   Procedure: TOTAL SHOULDER ARTHROPLASTY;  Surgeon: Senaida Lange, MD;  Location: [facility] OR;  Service: Orthopedics;  Laterality: Left;    ALLERGIES: Allergies  Allergen Reactions   Flexeril [Cyclobenzaprine] Other (See Comments)     On Pristiq- possible serotonin reaction to flexeril can not take together   Ace Inhibitors Other (See Comments) and Swelling    States after a surgery she was told to "not take" this class of drugs- not 100% sure what the reason was Possible angioedema during/after general anesthesia States after a surgery she was told to "not take" this class of drugs- not 100% sure what the reason was States after a surgery she was told to "not take" this class of drugs- not 100% sure what the reason was    Codeine Nausea Only and Other (See Comments)    Upset stomach   Hydrochlorothiazide     cramps   Kiwi Extract Other (See Comments)   Metoprolol     Edema, fatigue, headache, low HR into 40s   Azithromycin Rash    FAMILY HISTORY: Family History  Problem Relation Age of Onset   Stroke Mother    Liver cancer Father    Cancer Father        liver   Breast cancer Neg Hx     SOCIAL HISTORY: Social History   Tobacco Use   Smoking status: Former    Current packs/day: 0.00    Average packs/day: 0.7 packs/day for 20.0 years (14.0 ttl pk-yrs)    Types: Cigarettes    Start date: 03/04/1958    Quit date: 03/04/1978    Years since quitting: 44.5   Smokeless tobacco: Never  Vaping Use   Vaping status: Never Used  Substance Use Topics   Alcohol use: Yes    Alcohol/week: 2.0 standard drinks of alcohol    Types: 2 Glasses of wine per week    Comment: with dinner  or social rare -    Drug use: No   Social History   Social History Narrative   Widowed 2013. 2 kids. 4 grandkids. Oldest daughter lives in [place] and has 2 grandkids that are now in Oceanographer, PT school at [employer].       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 [place] [address])   Does some dog sitting at her home   Live alone one      Objective:  Vital Signs:  BP (!) 145/81   Pulse 69   Ht 5\' 1"  (1.549 m)   Wt  223 lb (101.2 kg)   SpO2 99%    BMI 42.14 kg/m   General: General appearance: Awake and alert. No distress. Cooperative with exam.  HEENT: Atraumatic. Anicteric. Lungs: Non-labored breathing on room air  Extremities: Bilateral knee replacement scars. Psych: Affect appropriate.  Neurological: Mental Status: Alert. Speech fluent. No pseudobulbar affect Cranial Nerves: CNII: No RAPD. Visual fields intact. CNIII, IV, VI: PERRL. No nystagmus. EOMI. CN V: Facial sensation intact bilaterally to fine touch. CN VII: Facial muscles symmetric and strong. No ptosis at rest. CN VIII: Hears finger rub well bilaterally. CN IX: No hypophonia. CN X: Palate elevates symmetrically. CN XI: Full strength shoulder shrug bilaterally. CN XII: Tongue protrusion full and midline. No atrophy or fasciculations. No significant dysarthria. Motor: Tone is normal. Strength is 5/5 in bilateral upper and lower extremities. Reflexes: Intact and symmetric throughout Sensation: Pinprick: Intact in all extremities Coordination: Intact finger-to- nose-finger bilaterally. Gait: Antalgic gait, pain in knees (left > right)   Labs and Imaging review: New results: TSH (08/22/22 - external): wnl  07/11/22: CMP: unremarkable B12: 810 HbA1c: 6.1  Mg (06/05/22): 2.1  CT head wo contrast (06/05/22): FINDINGS: Brain: There is atrophy and chronic small vessel disease changes. No acute intracranial abnormality. Specifically, no hemorrhage, hydrocephalus, mass lesion, acute infarction, or significant intracranial injury.   Vascular: No hyperdense vessel or unexpected calcification.   Skull: No acute calvarial abnormality.   Sinuses/Orbits: No acute findings   Other: None   IMPRESSION: Atrophy, chronic microvascular disease.   No acute intracranial abnormality.  CT cervical spine wo contrast (06/05/22): FINDINGS: Alignment: 3 mm degenerative anterolisthesis of C4 on C5.   Skull base and vertebrae: No acute fracture. No primary bone  lesion or focal pathologic process.   Soft tissues and spinal canal: No prevertebral fluid or swelling. No visible canal hematoma.   Disc levels: Diffuse advanced degenerative disc disease, most pronounced at C5-6 and C6-7 with disc space narrowing and spurring. No disc herniation. Moderate to advanced degenerative facet disease bilaterally diffusely.   Upper chest: No acute findings   Other: None   IMPRESSION: Cervical spondylosis.  No acute bony abnormality.  Previously reviewed results: Lab Results  Component Value Date    HGBA1C 6.7 (H) 01/08/2022      Recent Labs       Lab Results  Component Value Date    VITAMINB12 729 01/08/2022      Recent Labs       Lab Results  Component Value Date    TSH 0.33 (L) 02/19/2021      Recent Labs[] Expand by Default       Lab Results  Component Value Date    ESRSEDRATE 32 (H) 08/10/2019      Magnesium (03/04/22): 1.9     External labs: TSH (10/30/21): 2.79 Vit D (10/22/21): 38   Imaging: CT head and cervical spine wo contrast (02/27/22): FINDINGS: CT HEAD FINDINGS   Brain: Acute, thin interhemispheric subdural hematoma, measuring up to 3 mm in thickness along the left anterior aspect of the falx (image 9 series 3). Acute left convexity subdural hematoma, measuring up to 6 mm along the inferior left frontal lobe (coronal image 24 series 5). No midline shift. Severe, confluence hypoattenuation of the cerebral white matter, most consistent with chronic small-vessel disease. Cortical gray-white differentiation is preserved. No hydrocephalus. Basilar cisterns are patent.   Vascular: No hyperdense vessel or unexpected calcification.   Skull: No calvarial fracture or suspicious bone lesion. Skull base is unremarkable.  Sinuses/Orbits: Paranasal sinuses, mastoid air cells, and middle ear cavities are well aerated. Orbits are unremarkable.   Other: Large right parietal scalp hematoma.   CT CERVICAL SPINE FINDINGS    Alignment: Degenerative reversal of the normal cervical lordosis. 3 mm degenerative anterolisthesis of C4 on C5 with associated facet arthropathy.   Skull base and vertebrae: No acute fracture. Normal craniocervical junction. Diffusely demineralized appearance of the bones.   Soft tissues and spinal canal: No prevertebral fluid or swelling. No visible canal hematoma.   Disc levels: Multilevel spondylosis with at least mild spinal canal stenosis at C4-5 and C5-6.   Upper chest: Atherosclerotic calcifications of the aortic arch and arch vessel origins.   Other: None.   IMPRESSION: CT HEAD:   1. Acute left convexity subdural hematoma, measuring up to 6 mm in thickness. No midline shift. 2. Acute, thin interhemispheric subdural hematoma, measuring up to 3 mm in thickness. 3. Large right parietal scalp hematoma. No underlying calvarial fracture.   CT CERVICAL SPINE:   1. No acute fracture or traumatic listhesis. 2. Multilevel spondylosis with at least mild spinal canal stenosis at C4-5 and C5-6.   Aortic Atherosclerosis (ICD10-I70.0).   Repeat CT head at 6 hours (02/27/22): FINDINGS: Brain: Small falcine subdural hematoma is again identified measuring up to 2 mm in thickness, without associated mass effect. The left-sided subdural hematoma seen previously is again noted, without appreciable change. This measures up to 5 mm in maximal thickness reference image 26/4, previously measuring 6 mm in a similar location. No mass effect or midline shift.   There are no new areas of intracranial hemorrhage. No evidence of acute infarct. Stable chronic small-vessel ischemic changes throughout the white matter and basal ganglia. Lateral ventricles and remaining midline structures are unremarkable.   Vascular: No hyperdense vessel or unexpected calcification. Stable atherosclerosis.   Skull: Large right parieto-occipital scalp hematoma has increased since prior study. No  underlying fracture. The remainder of the calvarium is unremarkable.   Sinuses/Orbits: No acute finding.   Other: None.   IMPRESSION: 1. Grossly stable falcine and left-sided subdural hematomas as above, without mass effect or midline shift. 2. No new areas of hemorrhage. 3. Stable chronic ischemic changes.  No acute infarct. 4. Increased size of the right parietooccipital scalp hematoma seen previously. No underlying fracture.  Assessment/Plan:  This is [name], a 82 y.o. female with falls, including one that caused a sub-dural hematoma in 02/2022. She had post traumatic headaches that have since resolved. She still has occasional falls, likely related to poor balance due to OA and knee pain. There does not appear to be any focal neurologic or nerve related problems contributing to imbalance currently.   Plan: -Discussed fall precautions to prevent further falls -Patient to call with new or worsening symptoms  Return to clinic as needed  Total time spent reviewing records, interview, history/exam, documentation, and coordination of care on day of encounter:  30 min  Traci Balint, MD

## 2022-09-05 ENCOUNTER — Encounter: Payer: Self-pay | Admitting: Family Medicine

## 2022-09-09 ENCOUNTER — Ambulatory Visit (INDEPENDENT_AMBULATORY_CARE_PROVIDER_SITE_OTHER): Payer: Medicare HMO | Admitting: Family

## 2022-09-09 VITALS — BP 129/82 | HR 73 | Temp 97.5°F | Ht 61.0 in | Wt 225.1 lb

## 2022-09-09 DIAGNOSIS — R21 Rash and other nonspecific skin eruption: Secondary | ICD-10-CM

## 2022-09-09 NOTE — Progress Notes (Signed)
Patient ID: [name], female    DOB: 09-Jul-1940, 82 y.o.   MRN: [id number]  Chief Complaint  Patient presents with   Rash    Pt c/o rash on legs since Saturday, Has spread. Has tried lotions for dryness and redness.     HPI: Skin rash:  pt reports red pinpoint bumps all over her lower bilateral legs, starting Saturday,  and has moved up to her thighs and mildly on arms. Reports restarting Ozempic 0.25mg  last Thursday & had been off for 2 months d/t lost coverage, had been on for 4 months up to 0.5mg  but never had a rash. Reports starting magnesium OTC at bedtime about 2w ago. Also states she had a flu vaccine on last Thursday but reports getting flu shots regularly with no reaction.  Denies any other new meds or med changes. No changes in soaps, detergents, lotions, etc.   Assessment & Plan:  Skin rash- pt does not want a steroid shot or take pills due to hx of not tolerating. Advised she increase her Zyrtec to bid x 3d and take Benadryl 25-50mg  at bedtime for next 3 nights. Use Hydrocortisone cream on bilateral legs bid to help resolve and for itching anywhere else. Call back if not resolving.   Subjective:    Outpatient Medications Prior to Visit  Medication Sig Dispense Refill   albuterol (VENTOLIN HFA) 108 (90 Base) MCG/ACT inhaler Inhale 1-2 puffs into the lungs every 4 (four) hours as needed for wheezing or shortness of breath. 18 g 2   amLODipine (NORVASC) 10 MG tablet TAKE 1 TABLET EVERY DAY 90 tablet 2   Biotin 5000 MCG CAPS Take 5,000 mcg by mouth daily.     Blood Glucose Monitoring Suppl (TRUE METRIX AIR GLUCOSE METER) DEVI Use to test blood sugars daily. Dx: E11.9 1 each 5   Cholecalciferol (VITAMIN D-3) 125 MCG (5000 UT) TABS Take 5,000 Units by mouth daily.     cyanocobalamin (,VITAMIN B-12,) 1000 MCG/ML injection INJECT (1,000 MCG TOTAL)  INTO THE MUSCLE EVERY 30 DAYS USE ON A MONTHLY BASES 10 mL 0   desvenlafaxine (PRISTIQ) 100 MG 24 hr tablet TAKE 1 TABLET  EVERY DAY 90 tablet 3   diclofenac (VOLTAREN) 75 MG EC tablet Take 1 tablet (75 mg total) by mouth 2 (two) times daily. 180 tablet 3   Evolocumab (REPATHA SURECLICK) 140 MG/ML SOAJ INJECT 1 PEN SUBCUTANEOUSLY EVERY TWO WEEKS (Patient taking differently: Inject 140 mg into the skin every 14 (fourteen) days.) 6 mL 3   FeFum-FePo-FA-B Cmp-C-Zn-Mn-Cu (SE-TAN PLUS) 162-115.2-1 MG CAPS Take 1 capsule by mouth 2 (two) times daily. 180 capsule 3   fluticasone (FLONASE) 50 MCG/ACT nasal spray Place 1 spray into both nostrils daily.     glucose blood test strip Test blood sugar once per day 100 each 3   HYDROcodone-acetaminophen (NORCO/VICODIN) 5-325 MG tablet Take 2 tablets by mouth every 6 (six) hours as needed for moderate pain. 30 tablet 0   Levothyroxine Sodium 150 MCG CAPS Take 150 mcg by mouth daily.     loperamide (IMODIUM) 2 MG capsule Take 4 mg by mouth daily as needed for diarrhea or loose stools.      nitroGLYCERIN (NITROSTAT) 0.4 MG SL tablet Place 1 tablet (0.4 mg total) under the tongue every 5 (five) minutes as needed for chest pain. 50 tablet 3   Omega-3 Fatty Acids (FISH OIL) 1000 MG CAPS Take 1,000 mg by mouth daily.     Propylene Glycol (  SYSTANE COMPLETE OP) Place 1 drop into both eyes in the morning.     Semaglutide (OZEMPIC, 0.25 OR 0.5 MG/DOSE, [place]) Inject into the skin.     TRUEplus Lancets 30G MISC Check blood sugar once per day 100 each 3   No facility-administered medications prior to visit.   Past Medical History:  Diagnosis Date   Anemia    Anxiety    Arthritis    osteoarthritis - shoulder, knees & hips   Asthma    Coronary artery disease    Depression    Diabetes mellitus without complication (HCC)    no meds   Fibromyalgia    Heart murmur    Hyperparathyroidism (HCC)    Hypertension    Dr. Terrilee Files manages BP, pt. reports MD has not found a need for treatment    Hypothyroidism    Polymyalgia rheumatica (HCC)    Sleep difficulties    had sleep study -2009,  prior to gastric surgery, told that there was not a need for f/u   Thyroid disease    Vitamin B 12 deficiency    Past Surgical History:  Procedure Laterality Date   ABDOMINAL HYSTERECTOMY     BARIATRIC SURGERY     CARDIAC CATHETERIZATION     [facility]- [facility] yrs. ago   CATARACT EXTRACTION, BILATERAL     late 2018   LEFT HEART CATH AND CORONARY ANGIOGRAPHY N/A 04/02/2018   Procedure: LEFT HEART CATH AND CORONARY ANGIOGRAPHY;  Surgeon: [place], Peter M, MD;  Location: [facility] INVASIVE CV LAB;  Service: Cardiovascular;  Laterality: N/A;   OOPHORECTOMY     pantallor arthrodesis with rod placement left foot     PARATHYROIDECTOMY Left 03/16/2021   Procedure: LEFT INFERIOR PARATHYROIDECTOMY;  Surgeon: Darnell Level, MD;  Location: [facility] ORS;  Service: General;  Laterality: Left;   TONSILLECTOMY     TOTAL KNEE ARTHROPLASTY Left 11/30/2018   Procedure: TOTAL KNEE ARTHROPLASTY;  Surgeon: Ollen Gross, MD;  Location: [facility] ORS;  Service: Orthopedics;  Laterality: Left;   TOTAL KNEE ARTHROPLASTY Right 06/28/2019   Procedure: TOTAL KNEE ARTHROPLASTY;  Surgeon: Ollen Gross, MD;  Location: [facility] ORS;  Service: Orthopedics;  Laterality: Right;    TOTAL SHOULDER ARTHROPLASTY Left 03/12/2012   Procedure: TOTAL SHOULDER ARTHROPLASTY;  Surgeon: Senaida Lange, MD;  Location: [facility] OR;  Service: Orthopedics;  Laterality: Left;   Allergies  Allergen Reactions   Flexeril [Cyclobenzaprine] Other (See Comments)    On Pristiq- possible serotonin reaction to flexeril can not take together   Ace Inhibitors Other (See Comments) and Swelling    States after a surgery she was told to "not take" this class of drugs- not 100% sure what the reason was Possible angioedema during/after general anesthesia States after a surgery she was told to "not take" this class of drugs- not 100% sure what the reason was States after a surgery she was told to "not take" this class of drugs- not 100% sure what the reason was    Codeine Nausea Only and  Other (See Comments)    Upset stomach   Hydrochlorothiazide     cramps   Kiwi Extract Other (See Comments)   Metoprolol     Edema, fatigue, headache, low HR into 40s   Azithromycin Rash      Objective:    Physical Exam Vitals and nursing note reviewed.  Constitutional:      Appearance: Normal appearance.  Cardiovascular:     Rate and Rhythm: Normal rate and regular rhythm.  Pulmonary:     Effort: Pulmonary effort is normal.     Breath sounds: Normal breath sounds.  Musculoskeletal:        General: Normal range of motion.  Skin:    General: Skin is warm and dry.     Findings: Rash (red, pinpoint, diffuse rash on bilateral lower legs, no edema, no itching, also noted on upper legs, lower back, & bilateral arms but milder) present.  Neurological:     Mental Status: She is alert.  Psychiatric:        Mood and Affect: Mood normal.        Behavior: Behavior normal.    BP 129/82 (BP Location: Left Arm, Patient Position: Sitting, Cuff Size: Large)   Pulse 73   Temp (!) 97.5 F (36.4 C) (Temporal)   Ht 5\' 1"  (1.549 m)   Wt 225 lb 2 oz (102.1 kg)   SpO2 98%   BMI 42.54 kg/m  Wt Readings from Last 3 Encounters:  09/09/22 225 lb 2 oz (102.1 kg)  08/22/22 221 lb 6.4 oz (100.4 kg)  07/11/22 216 lb (98 kg)       Dulce Sellar, NP

## 2022-09-09 NOTE — Patient Instructions (Addendum)
It was very nice to see you today!   For your rash, increase your Zyrtec to twice a day for the next 3 days.  Ok to also take 25-50mg  of Benadryl at night for 3 nights. Be sure to hydrate well with water daily. Use the Hydrocortisone cream you have at home on your legs to help resolve rash & can help with itching, if this develops.  Call back or send a MyChart message if rash is not resolving, worsens, or gets worse again after your next Ozempic shot.      PLEASE NOTE:  If you had any lab tests please let us know if you have not heard back within a few days. You may see your results on MyChart before we have a chance to review them but we will give you a call once they are reviewed by [place]. If we ordered any referrals today, please let us know if you have not heard from their office within the next week.

## 2022-09-11 ENCOUNTER — Encounter: Payer: Self-pay | Admitting: Neurology

## 2022-09-11 ENCOUNTER — Ambulatory Visit: Payer: Medicare HMO | Admitting: Neurology

## 2022-09-11 VITALS — BP 145/81 | HR 69 | Ht 61.0 in | Wt 223.0 lb

## 2022-09-11 DIAGNOSIS — W19XXXS Unspecified fall, sequela: Secondary | ICD-10-CM | POA: Diagnosis not present

## 2022-09-11 DIAGNOSIS — G44309 Post-traumatic headache, unspecified, not intractable: Secondary | ICD-10-CM | POA: Diagnosis not present

## 2022-09-11 DIAGNOSIS — S065XAA Traumatic subdural hemorrhage with loss of consciousness status unknown, initial encounter: Secondary | ICD-10-CM | POA: Diagnosis not present

## 2022-09-11 DIAGNOSIS — S0003XS Contusion of scalp, sequela: Secondary | ICD-10-CM

## 2022-09-11 NOTE — Patient Instructions (Signed)
Preventing Falls at [facility] are common, often dreaded events in the lives of older people. Aside from the obvious injuries and even death that may result, fall can cause wide-ranging consequences including loss of independence, mental decline, decreased activity and mobility. Younger people are also at risk of falling, especially those with chronic illnesses and fatigue.  Ways to reduce risk for falling Examine diet and medications. Warm foods and alcohol dilate blood vessels, which can lead to dizziness when standing. Sleep aids, antidepressants and pain medications can also increase the likelihood of a fall.  Get a vision exam. Poor vision, cataracts and glaucoma increase the chances of falling.  Check foot gear. Shoes should fit snugly and have a sturdy, nonskid sole and a broad, low heel  Participate in a physician-approved exercise program to build and maintain muscle strength and improve balance and coordination. Programs that use ankle weights or stretch bands are excellent for muscle-strengthening. Water aerobics programs and low-impact Tai Chi programs have also been shown to improve balance and coordination.  Increase vitamin D intake. Vitamin D improves muscle strength and increases the amount of calcium the body is able to absorb and deposit in bones.  How to prevent falls from common hazards Floors - Remove all loose wires, cords, and throw rugs. Minimize clutter. Make sure rugs are anchored and smooth. Keep furniture in its usual place.  Chairs -- Use chairs with straight backs, armrests and firm seats. Add firm cushions to existing pieces to add height.  Bathroom - Install grab bars and non-skid tape in the tub or shower. Use a bathtub transfer bench or a shower chair with a back support Use an elevated toilet seat and/or safety rails to assist standing from a low surface. Do not use towel racks or bathroom tissue holders to help you stand.  Lighting - Make sure halls,  stairways, and entrances are well-lit. Install a night light in your bathroom or hallway. Make sure there is a light switch at the top and bottom of the staircase. Turn lights on if you get up in the middle of the night. Make sure lamps or light switches are within reach of the bed if you have to get up during the night.  Kitchen - Install non-skid rubber mats near the sink and stove. Clean spills immediately. Store frequently used utensils, pots, pans between waist and eye level. This helps prevent reaching and bending. Sit when getting things out of lower cupboards.  Living room/ Bedrooms - Place furniture with wide spaces in between, giving enough room to move around. Establish a route through the living room that gives you something to hold onto as you walk.  Stairs - Make sure treads, rails, and rugs are secure. Install a rail on both sides of the stairs. If stairs are a threat, it might be helpful to arrange most of your activities on the lower level to reduce the number of times you must climb the stairs.  Entrances and doorways - Install metal handles on the walls adjacent to the doorknobs of all doors to make it more secure as you travel through the doorway.  Tips for maintaining balance Keep at least one hand free at all times. Try using a backpack or fanny pack to hold things rather than carrying them in your hands. Never carry objects in both hands when walking as this interferes with keeping your balance.  Attempt to swing both arms from front to back while walking. This might require a conscious  effort if Parkinson's disease has diminished your movement. It will, however, help you to maintain balance and posture, and reduce fatigue.  Consciously lift your feet off of the ground when walking. Shuffling and dragging of the feet is a common culprit in losing your balance.  When trying to navigate turns, use a "U" technique of facing forward and making a wide turn, rather than pivoting  sharply.  Try to stand with your feet shoulder-length apart. When your feet are close together for any length of time, you increase your risk of losing your balance and falling.  Do one thing at a time. Don't try to walk and accomplish another task, such as reading or looking around. The decrease in your automatic reflexes complicates motor function, so the less distraction, the better.  Do not wear rubber or gripping soled shoes, they might "catch" on the floor and cause tripping.  Move slowly when changing positions. Use deliberate, concentrated movements and, if needed, use a grab bar or walking aid. Count 15 seconds between each movement. For example, when rising from a seated position, wait 15 seconds after standing to begin walking.  If balance is a continuous problem, you might want to consider a walking aid such as a cane, walking stick, or walker. Once you've mastered walking with help, you might be ready to try it on your own again.   The physicians and staff at [facility] are committed to providing excellent care. You may receive a survey requesting feedback about your experience at our office. We strive to receive "very good" responses to the survey questions. If you feel that your experience would prevent you from giving the office a "very good " response, please contact our office to try to remedy the situation. We may be reached at [phone]. Thank you for taking the time out of your busy day to complete the survey.  Jacquelyne Balint, MD [facility]

## 2022-09-14 ENCOUNTER — Encounter: Payer: Self-pay | Admitting: Cardiovascular Disease

## 2022-09-16 ENCOUNTER — Other Ambulatory Visit: Payer: Self-pay | Admitting: Pharmacist

## 2022-09-16 MED ORDER — REPATHA SURECLICK 140 MG/ML ~~LOC~~ SOAJ: 140.0000 mg | SUBCUTANEOUS | 3 refills | Status: DC

## 2022-09-20 ENCOUNTER — Ambulatory Visit (INDEPENDENT_AMBULATORY_CARE_PROVIDER_SITE_OTHER): Payer: Medicare HMO | Admitting: Family Medicine

## 2022-09-20 ENCOUNTER — Encounter: Payer: Self-pay | Admitting: Family Medicine

## 2022-09-20 VITALS — BP 122/68 | HR 67 | Temp 98.1°F | Ht 61.0 in | Wt 223.0 lb

## 2022-09-20 DIAGNOSIS — N3001 Acute cystitis with hematuria: Secondary | ICD-10-CM | POA: Diagnosis not present

## 2022-09-20 DIAGNOSIS — R350 Frequency of micturition: Secondary | ICD-10-CM | POA: Diagnosis not present

## 2022-09-20 LAB — POCT URINALYSIS DIPSTICK
Bilirubin, UA: NEGATIVE
Glucose, UA: NEGATIVE
Ketones, UA: NEGATIVE
Nitrite, UA: NEGATIVE
Protein, UA: POSITIVE — AB
Spec Grav, UA: 1.01 (ref 1.010–1.025)
Urobilinogen, UA: 0.2 E.U./dL
pH, UA: 6.5 (ref 5.0–8.0)

## 2022-09-20 MED ORDER — SULFAMETHOXAZOLE-TRIMETHOPRIM 800-160 MG PO TABS
1.0000 | ORAL_TABLET | Freq: Two times a day (BID) | ORAL | 0 refills | Status: AC
Start: 2022-09-20 — End: 2022-09-25

## 2022-09-20 NOTE — Progress Notes (Signed)
Acute Office Visit   Subjective:  Patient ID: [name], female    DOB: 10-24-1940, 82 y.o.   MRN: [id number]  Chief Complaint  Patient presents with   Urinary Frequency    Pt reports urine frequency , burning and low back odor. Sx started yesterday. Pt reports UTI 8/1    HPI:  Patient is a 82 year old female that presents with increase in frequency, dysuria, and low back pain. Denies fever, nausea, vomiting, hematuria, or abd pain. Had some loose, semi-formed stools.   Symptoms started yesterday.   Patients last UTI was on 08/01. She was initially prescribed Keflex and changed to Macrobid because of urine culture.   Review of Systems  Genitourinary:  Positive for frequency.  See HPI above     Objective:   BP 122/68   Pulse 67   Temp 98.1 F (36.7 C)   Ht 5\' 1"  (1.549 m)   Wt 223 lb (101.2 kg)   SpO2 (!) 67%   BMI 42.14 kg/m    Physical Exam Vitals reviewed.  Constitutional:      General: She is not in acute distress.    Appearance: Normal appearance. She is not ill-appearing, toxic-appearing or diaphoretic.  Eyes:     General:        Right eye: No discharge.        Left eye: No discharge.     Conjunctiva/sclera: Conjunctivae normal.  Cardiovascular:     Rate and Rhythm: Normal rate and regular rhythm.     Heart sounds: Normal heart sounds. No murmur heard.    No friction rub. No gallop.  Pulmonary:     Effort: Pulmonary effort is normal. No respiratory distress.     Breath sounds: Normal breath sounds.  Abdominal:     General: Bowel sounds are normal. There is no distension.     Palpations: Abdomen is soft.     Tenderness: There is no abdominal tenderness. There is no right CVA tenderness or left CVA tenderness.  Musculoskeletal:        General: Normal range of motion.  Skin:    General: Skin is warm and dry.  Neurological:     General: No focal deficit present.     Mental Status: She is alert and oriented to person, place, and time. Mental  status is at baseline.  Psychiatric:        Mood and Affect: Mood normal.        Behavior: Behavior normal.        Thought Content: Thought content normal.        Judgment: Judgment normal.     Results for orders placed or performed in visit on 09/20/22  POCT urinalysis dipstick  Result Value Ref Range   Color, UA cloudy    Clarity, UA     Glucose, UA Negative Negative   Bilirubin, UA neg    Ketones, UA neg    Spec Grav, UA 1.010 1.010 - 1.025   Blood, UA 3+    pH, UA 6.5 5.0 - 8.0   Protein, UA Positive (A) Negative   Urobilinogen, UA 0.2 0.2 or 1.0 E.U./dL   Nitrite, UA neg    Leukocytes, UA Large (3+) (A) Negative   Appearance     Odor        Assessment & Plan:  Acute cystitis with hematuria -     Sulfamethoxazole-Trimethoprim; Take 1 tablet by mouth 2 (two) times daily for 5 days.  Dispense:  10 tablet; Refill: 0  Urine frequency -     POCT urinalysis dipstick -     Urine Culture  -Urinalysis shows a urinary tract infection with protein, leukocytes, and blood.  -Prescribed Bactrim 800/160mg  tablet, 1 tablet every twice a day for 5 days.  -Sent urine for urine culture. Office will call with lab results and she may see them on MyChart. Will change antibiotic if needed based on urine culture.  -If symptoms were to get worse or you develop a fever, please go to the closes emergency department.   Zandra Abts, NP

## 2022-09-20 NOTE — Patient Instructions (Addendum)
-  Urinalysis shows you have a urinary tract infection.  -START Bactrim 800/160mg  tablet, 1 tablet every twice a day for 5 days.  -Sent urine for urine culture. Office will call with lab results and you may see them on MyChart. Will change antibiotic if needed based on urine culture.  -If symptoms were to get worse or you develop a fever, please go to the closes emergency department.

## 2022-09-21 LAB — URINE CULTURE
MICRO NUMBER:: 15406062
Result:: NO GROWTH
SPECIMEN QUALITY:: ADEQUATE

## 2022-09-24 ENCOUNTER — Telehealth: Payer: Self-pay

## 2022-09-24 NOTE — Telephone Encounter (Signed)
Pt has reviewed via MyChart

## 2022-09-24 NOTE — Telephone Encounter (Signed)
-----   Message from Zandra Abts sent at 09/24/2022  8:12 AM EDT ----- There is no growth found on urine culture. You do not need to complete taking antibiotic. If symptoms are still occurring, recommend to follow up with PCP.

## 2022-10-17 ENCOUNTER — Telehealth: Payer: Self-pay | Admitting: Family Medicine

## 2022-10-17 ENCOUNTER — Other Ambulatory Visit: Payer: Self-pay | Admitting: Family

## 2022-10-17 MED ORDER — HYDROCODONE-ACETAMINOPHEN 5-325 MG PO TABS: 2.0000 | ORAL_TABLET | Freq: Four times a day (QID) | ORAL | 0 refills | Status: DC | PRN

## 2022-10-17 NOTE — Telephone Encounter (Signed)
Prescription Request  10/17/2022  LOV: 08/22/2022 Patient has been scheduled for med refill OV on 10/1 w/ Dr. Jimmey Ralph just in case needed  What is the name of the medication or equipment? HYDROcodone-acetaminophen (NORCO/VICODIN) 5-325 MG tablet   Have you contacted your pharmacy to request a refill? Yes   Which pharmacy would you like this sent to?  [facility] [address] [place], [place] - [address] [place] [address] Phone: [phone] Fax: [phone]     Patient notified that their request is being sent to the clinical staff for review and that they should receive a response within 2 business days.   Please advise at Mobile [phone] (mobile)

## 2022-10-18 ENCOUNTER — Ambulatory Visit: Payer: Medicare HMO | Admitting: Cardiology

## 2022-10-22 ENCOUNTER — Ambulatory Visit: Payer: Medicare HMO | Admitting: Family Medicine

## 2022-10-23 ENCOUNTER — Telehealth: Payer: Self-pay | Admitting: *Deleted

## 2022-10-23 DIAGNOSIS — M19011 Primary osteoarthritis, right shoulder: Secondary | ICD-10-CM | POA: Diagnosis not present

## 2022-10-23 NOTE — Telephone Encounter (Signed)
Pre-operative Risk Assessment    Patient Name: [name]  DOB: 1940-07-17 MRN: [id number]    DATE OF LAST VISIT: 10/26/20 VIN BHAGAT, PAC DATE OF NEXT VISIT: 11/18/22 DR. NAHSER  Request for Surgical Clearance    Procedure:   RIGHT REVERSE SHOULDER ARTHROPLASTY  Date of Surgery:  Clearance TBD                                 Surgeon:  DR. Caryn Bee SUPPLE Surgeon's Group or Practice Name:  [facility] Phone number:  [phone] ATTN: Aida Raider Fax number:  [phone]   Type of Clearance Requested:   - Medical ; NO MEDICATIONS LISTED AS NEEDING TO BE HELD   Type of Anesthesia:  General    Additional requests/questions:    Elpidio Anis   10/23/2022, 5:14 PM

## 2022-10-24 NOTE — Telephone Encounter (Signed)
I will update all parties involved pt has appt 11/18/22 with Dr. Elease Hashimoto.

## 2022-10-24 NOTE — Telephone Encounter (Signed)
Name: [name]  DOB: Jan 06, 1941  MRN: [id number]  Primary Cardiologist: Lesleigh Noe, MD (Inactive)  Chart reviewed as part of pre-operative protocol coverage. The patient has an upcoming visit scheduled with Dr. Elease Hashimoto on 11/18/2022 at which time clearance can be addressed in case there are any issues that would impact surgical recommendations.  Right reverse shoulder surgery is not scheduled until TBD as below. I added preop FYI to appointment note so that provider is aware to address at time of outpatient visit.  Per office protocol the cardiology provider should forward their finalized clearance decision and recommendations regarding antiplatelet therapy to the requesting party below.    This message will also be routed to pharmacy pool and/or Dr Elease Hashimoto for input on holding any medications as requested below so that this information is available to the clearing provider at time of patient's appointment.   I will route this message as FYI to requesting party and remove this message from the preop box as separate preop APP input not needed at this time.   Please call with any questions.  Joni Reining, NP  10/24/2022, 7:50 AM

## 2022-11-04 ENCOUNTER — Encounter: Payer: Self-pay | Admitting: Family Medicine

## 2022-11-09 ENCOUNTER — Other Ambulatory Visit (HOSPITAL_COMMUNITY): Payer: Self-pay

## 2022-11-11 ENCOUNTER — Other Ambulatory Visit (HOSPITAL_COMMUNITY): Payer: Self-pay

## 2022-11-17 NOTE — Progress Notes (Unsigned)
Cardiology Office Note:  .   Date:  11/18/2022  ID:  [name], DOB [birth year], MRN [id number] PCP: Traci Majestic, MD  [facility] Providers Cardiologist:  Traci Noe, MD (Inactive)    History of Present Illness: .   [name] is a 82 y.o. female with hx of moderate CAD by cath in 2020, gastric bypass surgery, HTN, HLD hypothyroidism  Seen for pre op eval. For shoulder replacement   No CP , no dyspnea   Wt is 216 lbs   Has continued to have some chest pain - which she now thinks is gastric  Unable to do much exercise  Walks with a walker  Larey Seat this past February , was in the ICU   Still eats processed meats  We discussed limiting   She is stable  She is at low risk for her upcoming shoulder surgery        ROS:   Studies Reviewed: Marland Kitchen      EKG Interpretation Date/Time:  Monday November 18 2022 08:06:57 EDT Ventricular Rate:  70 PR Interval:  168 QRS Duration:  90 QT Interval:  434 QTC Calculation: 468 R Axis:   50  Text Interpretation: Sinus rhythm with Premature atrial complexes Cannot rule out Anterior infarct , age undetermined When compared with ECG of 05-Jun-2022 20:15, No significant change since last tracing Confirmed by [name] [phone]) on 11/18/2022 8:18:36 AM   Risk Assessment/Calculations:             Physical Exam:   VS:  BP 136/84   Pulse 70   Ht 5\' 1"  (1.549 m)   Wt 216 lb (98 kg)   SpO2 97%   BMI 40.81 kg/m    Wt Readings from Last 3 Encounters:  11/18/22 216 lb (98 kg)  09/20/22 223 lb (101.2 kg)  09/11/22 223 lb (101.2 kg)    GEN: Well nourished, well developed in no acute distress NECK: No JVD; No carotid bruits CARDIAC:RRR, no murmurs, rubs, gallops RESPIRATORY:  Clear to auscultation without rales, wheezing or rhonchi  ABDOMEN: Soft, non-tender, non-distended EXTREMITIES:  No edema; No deformity   ASSESSMENT AND PLAN: .     CAD:   moderate CAD  She is very stable and is at low risk  from a cardiac standpoint.  2.  Hyperlipidemia: She is currently on Repatha.  She needs to have her patient assistance updated to get her Repatha.   3.  Shoulder surgery :   she is doing well She is at low risk for her upcoming shoulder surgery    Will have her follow-up in 1 year.       Dispo: 1 year    Signed, Traci Miss, MD

## 2022-11-18 ENCOUNTER — Ambulatory Visit: Payer: Medicare HMO | Attending: Cardiology | Admitting: Cardiovascular Disease

## 2022-11-18 ENCOUNTER — Telehealth: Payer: Self-pay | Admitting: Pharmacist

## 2022-11-18 ENCOUNTER — Encounter: Payer: Self-pay | Admitting: Pharmacist

## 2022-11-18 ENCOUNTER — Encounter: Payer: Self-pay | Admitting: Cardiovascular Disease

## 2022-11-18 VITALS — BP 136/84 | HR 70 | Ht 61.0 in | Wt 216.0 lb

## 2022-11-18 DIAGNOSIS — E1169 Type 2 diabetes mellitus with other specified complication: Secondary | ICD-10-CM

## 2022-11-18 DIAGNOSIS — I2510 Atherosclerotic heart disease of native coronary artery without angina pectoris: Secondary | ICD-10-CM | POA: Diagnosis not present

## 2022-11-18 DIAGNOSIS — E785 Hyperlipidemia, unspecified: Secondary | ICD-10-CM | POA: Diagnosis not present

## 2022-11-18 DIAGNOSIS — Z01818 Encounter for other preprocedural examination: Secondary | ICD-10-CM | POA: Diagnosis not present

## 2022-11-18 NOTE — Telephone Encounter (Signed)
Kennedy Bucker still active through 12/03/22. Eligible to re-enroll for the next year though which I have done. Updated grant info has been sent to pt via mychart, I called her as well to notify her and she was appreciative for the assistance.  12/04/22 - 12/03/23, new grant info will be: Card #[id number] BIN [id number] PCN [id number] Group [id number]

## 2022-11-18 NOTE — Patient Instructions (Signed)
Testing/Procedures: **Cleared for upcoming surgery**  Follow-Up: At [facility], you and your health needs are our priority.  As part of our continuing mission to provide you with exceptional heart care, we have created designated Provider Care Teams.  These Care Teams include your primary Cardiologist (physician) and Advanced Practice Providers (APPs -  Physician Assistants and Nurse Practitioners) who all work together to provide you with the care you need, when you need it.   Your next appointment:   1 year(s)  Provider:   Kristeen Miss, MD

## 2022-11-18 NOTE — Telephone Encounter (Signed)
-----   Message from Nurse Dema Severin sent at 11/18/2022  8:32 AM EDT ----- Regarding: Repatha Grant Pt seen during clinic today and states her grant is about to run out and she needs it renewed. I'm not familiar with the process, so sending to both of you and explained someone would reach out to her with any questions/issues. Thank you for all your help, it's mucha appreciated!

## 2022-11-24 ENCOUNTER — Encounter: Payer: Self-pay | Admitting: Family Medicine

## 2022-11-25 ENCOUNTER — Other Ambulatory Visit: Payer: Self-pay

## 2022-11-25 MED ORDER — HYDROCODONE-ACETAMINOPHEN 5-325 MG PO TABS: 2.0000 | ORAL_TABLET | Freq: Four times a day (QID) | ORAL | 0 refills | Status: DC | PRN

## 2022-11-25 NOTE — Progress Notes (Signed)
Sent- increased pain with upcoming shoulder surgery within a month

## 2022-11-26 ENCOUNTER — Other Ambulatory Visit: Payer: Medicare HMO | Admitting: Pharmacist

## 2022-11-26 DIAGNOSIS — Z5986 Financial insecurity: Secondary | ICD-10-CM

## 2022-11-26 DIAGNOSIS — E1169 Type 2 diabetes mellitus with other specified complication: Secondary | ICD-10-CM | POA: Diagnosis not present

## 2022-11-26 DIAGNOSIS — M818 Other osteoporosis without current pathological fracture: Secondary | ICD-10-CM | POA: Diagnosis not present

## 2022-11-26 DIAGNOSIS — E559 Vitamin D deficiency, unspecified: Secondary | ICD-10-CM | POA: Diagnosis not present

## 2022-11-26 DIAGNOSIS — E210 Primary hyperparathyroidism: Secondary | ICD-10-CM | POA: Diagnosis not present

## 2022-11-26 DIAGNOSIS — E785 Hyperlipidemia, unspecified: Secondary | ICD-10-CM | POA: Diagnosis not present

## 2022-11-26 DIAGNOSIS — Z7985 Long-term (current) use of injectable non-insulin antidiabetic drugs: Secondary | ICD-10-CM

## 2022-11-26 DIAGNOSIS — E063 Autoimmune thyroiditis: Secondary | ICD-10-CM | POA: Diagnosis not present

## 2022-11-26 LAB — LIPID PANEL
Cholesterol: 123 (ref 0–200)
HDL: 51 (ref 35–70)
LDL Cholesterol: 51
Triglycerides: 125 (ref 40–160)

## 2022-11-26 LAB — HEMOGLOBIN A1C: Hemoglobin A1C: 6.4

## 2022-11-26 LAB — LAB REPORT - SCANNED: EGFR: 63

## 2022-11-26 MED ORDER — SEMAGLUTIDE (1 MG/DOSE) 4 MG/3ML ~~LOC~~ SOPN: 1.0000 mg | PEN_INJECTOR | SUBCUTANEOUS | 4 refills | Status: DC

## 2022-11-26 NOTE — Progress Notes (Signed)
11/26/2022 Name: [name] MRN: [id number] DOB: December 11, 1940  Chief Complaint  Patient presents with   Medication Management    Ozempic dose increase    [name] is a 82 y.o. year old female who presented for a telephone visit.   They were referred to the pharmacist by their PCP for assistance in managing medication access.   Subjective: Clinical Pharmacist received request for advice from Dr [facility] CMA regarding assistance with Ozempic. Patient has been getting Ozempic 0.5mg  weekly from [employer] patient assistance program for 2024.  She would like to increase dose due to experiencing a plateau effect on her appetite and weight loss. Patient is also due to reapply for [employer] patient assistance program for 2025.   Patient reports affordability concerns with their medications: Yes  - cost of Ozempic it too high once in Medicare coverage gap.  Patient reports access/transportation concerns to their pharmacy: No  Patient reports adherence concerns with their medications:  No     Started Ozempic 01/2022  Starting Weight = 225 lbs Starting BMI = 42.51 Staring A1c = 6.7% Last weight = 216 lbs Last BMI= 40.8 Last A1c = 6.1% Total weight loss to date = 9 lbs  Wt Readings from Last 3 Encounters:  11/18/22 216 lb (98 kg)  09/20/22 223 lb (101.2 kg)  09/11/22 223 lb (101.2 kg)    Objective:  Lab Results  Component Value Date   HGBA1C 6.1 07/11/2022    Lab Results  Component Value Date   CREATININE 0.94 09/03/2022   BUN 11 09/03/2022   NA 134 (L) 09/03/2022   K 4.1 09/03/2022   CL 95 (L) 09/03/2022   CO2 31 09/03/2022    Lab Results  Component Value Date   CHOL 155 01/08/2022   HDL 59.90 01/08/2022   LDLCALC 62 01/08/2022   LDLDIRECT 60.0 05/31/2019   TRIG 76 03/01/2022   CHOLHDL 3 01/08/2022    Medications Reviewed Today     Reviewed by Henrene Pastor, RPH-CPP (Pharmacist) on 11/26/22 at 1018  Med List Status: <None>   Medication Order  Taking? Sig Documenting Provider Last Dose Status Informant  albuterol (VENTOLIN HFA) 108 (90 Base) MCG/ACT inhaler [id number] No Inhale 1-2 puffs into the lungs every 4 (four) hours as needed for wheezing or shortness of breath. Shelva Majestic, MD Taking Active Self  amLODipine (NORVASC) 10 MG tablet [id number] No TAKE 1 TABLET EVERY DAY Shelva Majestic, MD Taking Active Self  Biotin 5000 MCG CAPS [id number] No Take 5,000 mcg by mouth daily. [provider] Taking Active Self  Blood Glucose Monitoring Suppl (TRUE METRIX AIR GLUCOSE METER) DEVI [id number] No Use to test blood sugars daily. Dx: E11.9 Shelva Majestic, MD Taking Active   Cholecalciferol (VITAMIN D-3) 125 MCG (5000 UT) TABS [id number] No Take 5,000 Units by mouth daily. [provider] Taking Active Self  cyanocobalamin (,VITAMIN B-12,) 1000 MCG/ML injection [id number] No INJECT (1,000 MCG TOTAL)  INTO THE MUSCLE EVERY 30 DAYS USE ON A MONTHLY BASES  Patient not taking: Reported on 09/20/2022   Shelva Majestic, MD Not Taking Active Self  desvenlafaxine (PRISTIQ) 100 MG 24 hr tablet [id number] No TAKE 1 TABLET EVERY DAY Shelva Majestic, MD Taking Active Self  diclofenac (VOLTAREN) 75 MG EC tablet [id number] No Take 1 tablet (75 mg total) by mouth 2 (two) times daily. Shelva Majestic, MD Taking Active   Evolocumab [facility] SURECLICK) 140 MG/ML Ivory Broad [id number] No Inject 140 mg  into the skin every 14 (fourteen) days. Nahser, Deloris Ping, MD Taking Active   FeFum-FePo-FA-B Cmp-C-Zn-Mn-Cu (SE-TAN PLUS) 162-115.2-1 MG CAPS [id number] No Take 1 capsule by mouth 2 (two) times daily. Shelva Majestic, MD Taking Active   fluticasone [facility]) 50 MCG/ACT nasal spray [id number] No Place 1 spray into both nostrils daily. [provider] Taking Active Self  glucose blood test strip [id number] No Test blood sugar once per day Shelva Majestic, MD Taking Active Self  HYDROcodone-acetaminophen (NORCO/VICODIN) 5-325 MG tablet  [id number]  Take 2 tablets by mouth every 6 (six) hours as needed for moderate pain (pain score 4-6). Shelva Majestic, MD  Active   Levothyroxine Sodium 150 MCG CAPS [id number] No Take 150 mcg by mouth daily. [provider] Taking Active Self           Med Note Anselm Pancoast R   Thu Oct 26, 2020  1:41 PM)    loperamide (IMODIUM) 2 MG capsule [id number] No Take 4 mg by mouth daily as needed for diarrhea or loose stools.  [provider] Taking Active Self  nitroGLYCERIN (NITROSTAT) 0.4 MG SL tablet [id number] No Place 1 tablet (0.4 mg total) under the tongue every 5 (five) minutes as needed for chest pain. Shelva Majestic, MD Taking Active Self  Omega-3 Fatty Acids (FISH OIL) 1000 MG CAPS [id number] No Take 1,000 mg by mouth daily. [provider] Taking Active Self  Propylene Glycol (SYSTANE COMPLETE OP) [id number] No Place 1 drop into both eyes in the morning. [provider] Taking Active Self  Semaglutide, 1 MG/DOSE, 4 MG/3ML SOPN [id number]  Inject 1 mg as directed once a week. Shelva Majestic, MD  Active   TRUEplus Lancets 30G MISC [id number] No Check blood sugar once per day Shelva Majestic, MD Taking Active Self              Assessment/Plan:   Diabetes / Obesity: Diabetes is currently controlled and weight has improved since starting Ozempic but she could benefit from increasing dose of Ozempic - Recommend to increase Ozempic to 1mg  weekly. Send link to Dr Erasmo Leventhal CMA to complete new prescription for [employer] patient assistance program program. They should send new shipment once they received updated prescriptions - Continues to meet financial criteria for Ozempic patient assistance program through [employer]. Will collaborate with provider, CPhT, and patient to re-enroll for medication assistance program for 2025. Mailed application to patient today.   Follow Up Plan: 2 to 3  weeks to check on medication assistance program application and  delivery of Ozempic 1mg  (dose increase)  Henrene Pastor, PharmD Clinical Pharmacist [facility]  Population Health [phone]

## 2022-11-26 NOTE — Telephone Encounter (Signed)
Do you know how we submit it through to this company [employer]?

## 2022-12-03 DIAGNOSIS — E063 Autoimmune thyroiditis: Secondary | ICD-10-CM | POA: Diagnosis not present

## 2022-12-03 DIAGNOSIS — E210 Primary hyperparathyroidism: Secondary | ICD-10-CM | POA: Diagnosis not present

## 2022-12-17 ENCOUNTER — Other Ambulatory Visit: Payer: Self-pay | Admitting: Pharmacist

## 2022-12-17 NOTE — Patient Instructions (Signed)
SURGICAL WAITING ROOM VISITATION  Patients having surgery or a procedure may have no more than 2 support people in the waiting area - these visitors may rotate.    Children under the age of 24 must have an adult with them who is not the patient.  Due to an increase in RSV and influenza rates and associated hospitalizations, children ages 58 and under may not visit patients in [facility] hospitals.  If the patient needs to stay at the hospital during part of their recovery, the visitor guidelines for inpatient rooms apply. Pre-op nurse will coordinate an appropriate time for 1 support person to accompany patient in pre-op.  This support person may not rotate.    Please refer to the [facility] website for the visitor guidelines for Inpatients (after your surgery is over and you are in a regular room).       Your procedure is scheduled on: 12/26/22   Report to [facility] Main Entrance    Report to admitting at 5:15 AM   Call this number if you have problems the morning of surgery [phone]   Do not eat food :After Midnight.   After Midnight you may have the following liquids until 4:30 AM DAY OF SURGERY  Water Non-Citrus Juices (without pulp, NO RED-Apple, White grape, White cranberry) Black Coffee (NO MILK/CREAM OR CREAMERS, sugar ok)  Clear Tea (NO MILK/CREAM OR CREAMERS, sugar ok) regular and decaf                             Plain Jell-O (NO RED)                                           Fruit ices (not with fruit pulp, NO RED)                                     Popsicles (NO RED)                                                               Sports drinks like Gatorade (NO RED)                  The day of surgery:  Drink ONE (1) Pre-Surgery  G2 at 4:30  AM the morning of surgery. Drink in one sitting. Do not sip.  This drink was given to you during your hospital  pre-op appointment visit. Nothing else to drink after completing the  Pre-Surgery G2.           Oral Hygiene is also important to reduce your risk of infection.                                    Remember - BRUSH YOUR TEETH THE MORNING OF SURGERY WITH YOUR REGULAR TOOTHPASTE  DENTURES WILL BE REMOVED PRIOR TO SURGERY PLEASE DO NOT APPLY "Poly grip" OR ADHESIVES!!!   Stop all vitamins and herbal supplements 7 days before surgery.  Take these medicines the morning of surgery with A SIP OF WATER: Amlodipine, Pristiq, Levothyroxine  DO NOT TAKE ANY ORAL DIABETIC MEDICATIONS DAY OF YOUR SURGERY Hole semaglutide at least 7 days prior to surgery.             You may not have any metal on your body including hair pins, jewelry, and body piercing             Do not wear make-up, lotions, powders, perfumes/cologne, or deodorant  Do not wear nail polish including gel and S&S, artificial/acrylic nails, or any other type of covering on natural nails including finger and toenails. If you have artificial nails, gel coating, etc. that needs to be removed by a nail salon please have this removed prior to surgery or surgery may need to be canceled/ delayed if the surgeon/ anesthesia feels like they are unable to be safely monitored.   Do not shave  48 hours prior to surgery.    Do not bring valuables to the hospital. [facility] IS NOT             RESPONSIBLE   FOR VALUABLES.   Contacts, glasses, dentures or bridgework may not be worn into surgery.   DO NOT BRING YOUR HOME MEDICATIONS TO THE HOSPITAL. PHARMACY WILL DISPENSE MEDICATIONS LISTED ON YOUR MEDICATION LIST TO YOU DURING YOUR ADMISSION IN THE HOSPITAL!    Patients discharged on the day of surgery will not be allowed to drive home.  Someone NEEDS to stay with you for the first 24 hours after anesthesia.   Special Instructions: Bring a copy of your healthcare power of attorney and living will documents the day of surgery if you haven't scanned them before.              Please read over the following fact sheets you were given: IF YOU  HAVE QUESTIONS ABOUT YOUR PRE-OP INSTRUCTIONS PLEASE CALL [phone] [facility]   If you received a COVID test during your pre-op visit  it is requested that you wear a mask when out in public, stay away from anyone that may not be feeling well and notify your surgeon if you develop symptoms. If you test positive for Covid or have been in contact with anyone that has tested positive in the last 10 days please notify you surgeon.      Pre-operative 5 CHG Bath Instructions   You can play a key role in reducing the risk of infection after surgery. Your skin needs to be as free of germs as possible. You can reduce the number of germs on your skin by washing with CHG (chlorhexidine gluconate) soap before surgery. CHG is an antiseptic soap that kills germs and continues to kill germs even after washing.   DO NOT use if you have an allergy to chlorhexidine/CHG or antibacterial soaps. If your skin becomes reddened or irritated, stop using the CHG and notify one of our RNs at [phone].   Please shower with the CHG soap starting 4 days before surgery using the following schedule:     Please keep in mind the following:  DO NOT shave, including legs and underarms, starting the day of your first shower.   You may shave your face at any point before/day of surgery.  Place clean sheets on your bed the day you start using CHG soap. Use a clean washcloth (not used since being washed) for each shower. DO NOT sleep with pets once you start using the CHG.  CHG Shower Instructions:  If you choose to wash your hair and private area, wash first with your normal shampoo/soap.  After you use shampoo/soap, rinse your hair and body thoroughly to remove shampoo/soap residue.  Turn the water OFF and apply about 3 tablespoons (45 ml) of CHG soap to a CLEAN washcloth.  Apply CHG soap ONLY FROM YOUR NECK DOWN TO YOUR TOES (washing for 3-5 minutes)  DO NOT use CHG soap on face, private areas, open wounds, or sores.   Pay special attention to the area where your surgery is being performed.  If you are having back surgery, having someone wash your back for you may be helpful. Wait 2 minutes after CHG soap is applied, then you may rinse off the CHG soap.  Pat dry with a clean towel  Put on clean clothes/pajamas   If you choose to wear lotion, please use ONLY the CHG-compatible lotions on the back of this paper.     Additional instructions for the day of surgery: DO NOT APPLY any lotions, deodorants, cologne, or perfumes.   Put on clean/comfortable clothes.  Brush your teeth.  Ask your nurse before applying any prescription medications to the skin.      CHG Compatible Lotions   Aveeno Moisturizing lotion  Cetaphil Moisturizing Cream  Cetaphil Moisturizing Lotion  Clairol Herbal Essence Moisturizing Lotion, Dry Skin  Clairol Herbal Essence Moisturizing Lotion, Extra Dry Skin  Clairol Herbal Essence Moisturizing Lotion, Normal Skin  Curel Age Defying Therapeutic Moisturizing Lotion with Alpha Hydroxy  Curel Extreme Care Body Lotion  Curel Soothing Hands Moisturizing Hand Lotion  Curel Therapeutic Moisturizing Cream, Fragrance-Free  Curel Therapeutic Moisturizing Lotion, Fragrance-Free  Curel Therapeutic Moisturizing Lotion, Original Formula  Eucerin Daily Replenishing Lotion  Eucerin Dry Skin Therapy Plus Alpha Hydroxy Crme  Eucerin Dry Skin Therapy Plus Alpha Hydroxy Lotion  Eucerin Original Crme  Eucerin Original Lotion  Eucerin Plus Crme Eucerin Plus Lotion  Eucerin TriLipid Replenishing Lotion  Keri Anti-Bacterial Hand Lotion  Keri Deep Conditioning Original Lotion Dry Skin Formula Softly Scented  Keri Deep Conditioning Original Lotion, Fragrance Free Sensitive Skin Formula  Keri Lotion Fast Absorbing Fragrance Free Sensitive Skin Formula  Keri Lotion Fast Absorbing Softly Scented Dry Skin Formula  Keri Original Lotion  Keri Skin Renewal Lotion Keri Silky Smooth Lotion  Keri Silky  Smooth Sensitive Skin Lotion  Nivea Body Creamy Conditioning Oil  Nivea Body Extra Enriched Lotion  Nivea Body Original Lotion  Nivea Body Sheer Moisturizing Lotion Nivea Crme  Nivea Skin Firming Lotion  NutraDerm 30 Skin Lotion  NutraDerm Skin Lotion  NutraDerm Therapeutic Skin Cream  NutraDerm Therapeutic Skin Lotion  ProShield Protective Hand Cream   Incentive Spirometer (Watch this video at home: [email or web address])  An incentive spirometer is a tool that can help keep your lungs clear and active. This tool measures how well you are filling your lungs with each breath. Taking long deep breaths may help reverse or decrease the chance of developing breathing (pulmonary) problems (especially infection) following: A long period of time when you are unable to move or be active. BEFORE THE PROCEDURE  If the spirometer includes an indicator to show your best effort, your nurse or respiratory therapist will set it to a desired goal. If possible, sit up straight or lean slightly forward. Try not to slouch. Hold the incentive spirometer in an upright position. INSTRUCTIONS FOR USE  Sit on the edge of your bed if possible, or sit up as far as you can in  bed or on a chair. Hold the incentive spirometer in an upright position. Breathe out normally. Place the mouthpiece in your mouth and seal your lips tightly around it. Breathe in slowly and as deeply as possible, raising the piston or the ball toward the top of the column. Hold your breath for 3-5 seconds or for as long as possible. Allow the piston or ball to fall to the bottom of the column. Remove the mouthpiece from your mouth and breathe out normally. Rest for a few seconds and repeat Steps 1 through 7 at least 10 times every 1-2 hours when you are awake. Take your time and take a few normal breaths between deep breaths. The spirometer may include an indicator to show your best effort. Use the indicator as a goal  to work toward during each repetition. After each set of 10 deep breaths, practice coughing to be sure your lungs are clear. If you have an incision (the cut made at the time of surgery), support your incision when coughing by placing a pillow or rolled up towels firmly against it. Once you are able to get out of bed, walk around indoors and cough well. You may stop using the incentive spirometer when instructed by your caregiver.  RISKS AND COMPLICATIONS Take your time so you do not get dizzy or light-headed. If you are in pain, you may need to take or ask for pain medication before doing incentive spirometry. It is harder to take a deep breath if you are having pain. AFTER USE Rest and breathe slowly and easily. It can be helpful to keep track of a log of your progress. Your caregiver can provide you with a simple table to help with this. If you are using the spirometer at home, follow these instructions: SEEK MEDICAL CARE IF:  You are having difficultly using the spirometer. You have trouble using the spirometer as often as instructed. Your pain medication is not giving enough relief while using the spirometer. You develop fever of 100.5 F (38.1 C) or higher. SEEK IMMEDIATE MEDICAL CARE IF:  You cough up bloody sputum that had not been present before. You develop fever of 102 F (38.9 C) or greater. You develop worsening pain at or near the incision site. MAKE SURE YOU:  Understand these instructions. Will watch your condition. Will get help right away if you are not doing well or get worse. Document Released: 05/20/2006 Document Revised: 04/01/2011 Document Reviewed: 07/21/2006 [facility] Patient Information 2014 [place], [place]- Preparing for Total Shoulder Arthroplasty    Before surgery, you can play an important role. Because skin is not sterile, your skin needs to be as free of germs as possible. You can reduce the number of germs on your skin by using the following  products. Benzoyl Peroxide Gel Reduces the number of germs present on the skin Applied twice a day to shoulder area starting two days before surgery    ==================================================================  Please follow these instructions carefully:  BENZOYL PEROXIDE 5% GEL  Please do not use if you have an allergy to benzoyl peroxide.   If your skin becomes reddened/irritated stop using the benzoyl peroxide.  Starting two days before surgery, apply as follows: Apply benzoyl peroxide in the morning and at night. Apply after taking a shower. If you are not taking a shower clean entire shoulder front, back, and side along with the armpit with a clean wet washcloth.  Place a quarter-sized dollop on your shoulder and rub in thoroughly, making sure  to cover the front, back, and side of your shoulder, along with the armpit.   2 days before ____ AM   ____ PM              1 day before ____ AM   ____ PM                         Do this twice a day for two days.  (Last application is the night before surgery, AFTER using the CHG soap as described below).  Do NOT apply benzoyl peroxide gel on the day of surgery.

## 2022-12-17 NOTE — Progress Notes (Signed)
COVID Vaccine received:  []  No [x]  Yes Date of any COVID positive Test in last 90 days: no PCP - Tana Conch MD Cardiologist Kristeen Miss MD  Chest x-ray - 06/05/22 Epic EKG -  11/18/22 Epic Stress Test - 01/02/18 Epic ECHO - 10/31/20 Epic Cardiac Cath - 04/02/18 Epic  Bowel Prep - [x]  No  []   Yes ______  Pacemaker / ICD device [x]  No []  Yes   Spinal Cord Stimulator:[x]  No []  Yes       History of Sleep Apnea? [x]  No []  Yes   CPAP used?- [x]  No []  Yes    Does the patient monitor blood sugar?          [x]  No []  Yes  []  N/A  Patient has: []  NO Hx DM   []  Pre-DM                 []  DM1  [x]   DM2 Does patient have a [employer] or [employer]om? []  No []  Yes   Fasting Blood Sugar Ranges-  Checks Blood Sugar _____ times a day  GLP1 agonist / usual dose -  GLP1 instructions:  SGLT-2 inhibitors / usual dose -Ozempic Hold x 7 days  SGLT-2 instructions:   Blood Thinner / Instructions:no Aspirin Instructions:no  Comments:   Activity level: Patient is  unable to climb a flight of stairs without difficulty; [x]  No CP  [x]  No SOB_  Patient can / perform ADLs without assistance.   Anesthesia review: DM, CAD, HTN, Aortic athersclerosis  Patient denies shortness of breath, fever, cough and chest pain at PAT appointment.  Patient verbalized understanding and agreement to the Pre-Surgical Instructions that were given to them at this PAT appointment. Patient was also educated of the need to review these PAT instructions again prior to his/her surgery.I reviewed the appropriate phone numbers to call if they have any and questions or concerns.

## 2022-12-18 ENCOUNTER — Other Ambulatory Visit: Payer: Self-pay

## 2022-12-18 ENCOUNTER — Encounter (HOSPITAL_COMMUNITY): Payer: Self-pay

## 2022-12-18 ENCOUNTER — Encounter (HOSPITAL_COMMUNITY)
Admission: RE | Admit: 2022-12-18 | Discharge: 2022-12-18 | Disposition: A | Discharge status: Home / Self Care | Payer: Medicare HMO | Source: Ambulatory Visit | Attending: Orthopedic Surgery | Admitting: Orthopedic Surgery

## 2022-12-18 VITALS — BP 131/92 | HR 74 | Temp 98.6°F | Resp 16 | Ht 61.0 in | Wt 218.0 lb

## 2022-12-18 DIAGNOSIS — E119 Type 2 diabetes mellitus without complications: Secondary | ICD-10-CM | POA: Diagnosis not present

## 2022-12-18 DIAGNOSIS — Z01812 Encounter for preprocedural laboratory examination: Secondary | ICD-10-CM | POA: Diagnosis not present

## 2022-12-18 DIAGNOSIS — Z01818 Encounter for other preprocedural examination: Secondary | ICD-10-CM

## 2022-12-18 DIAGNOSIS — I10 Essential (primary) hypertension: Secondary | ICD-10-CM | POA: Diagnosis not present

## 2022-12-18 DIAGNOSIS — I2510 Atherosclerotic heart disease of native coronary artery without angina pectoris: Secondary | ICD-10-CM

## 2022-12-18 HISTORY — DX: Personal history of other diseases of the digestive system: Z87.19

## 2022-12-18 LAB — GLUCOSE, CAPILLARY: Glucose-Capillary: 116 mg/dL — ABNORMAL HIGH (ref 70–99)

## 2022-12-18 LAB — CBC
HCT: 40.6 % (ref 36.0–46.0)
Hemoglobin: 13.7 g/dL (ref 12.0–15.0)
MCH: 30.7 pg (ref 26.0–34.0)
MCHC: 33.7 g/dL (ref 30.0–36.0)
MCV: 91 fL (ref 80.0–100.0)
Platelets: 218 10*3/uL (ref 150–400)
RBC: 4.46 MIL/uL (ref 3.87–5.11)
RDW: 13.1 % (ref 11.5–15.5)
WBC: 8.6 10*3/uL (ref 4.0–10.5)
nRBC: 0 % (ref 0.0–0.2)

## 2022-12-18 LAB — SURGICAL PCR SCREEN
MRSA, PCR: NEGATIVE
Staphylococcus aureus: NEGATIVE

## 2022-12-23 NOTE — Progress Notes (Signed)
Anesthesia Chart Review   Case: [id number] Date/Time: 12/26/22 0715   Procedure: REVERSE SHOULDER ARTHROPLASTY (Right: Shoulder) -   Anesthesia type: General   Pre-op diagnosis: Right shoulder osteoarthritis with rotator cuff dysfunction   Location: [facility]OM 06 / [facility]   Surgeons: Francena Hanly, MD       DISCUSSION:82 y.o. former smoker with h/o HTN, DM II, s/p gastric bypass, CAD, right shoulder OA scheduled for above procedure 12/26/2022 with Dr. Francena Hanly.   Pt last seen by cardiology 11/18/2022 for preoperative evaluation.  Per OV note, "she is doing well She is at low risk for her upcoming shoulder surgery"   VS: BP (!) 131/92   Pulse 74   Temp 37 C (Oral)   Resp 16   Ht 5\' 1"  (1.549 m)   Wt 98.9 kg   SpO2 96%   BMI 41.19 kg/m   PROVIDERS: Shelva Majestic, MD is PCP   Kristeen Miss, MD is Cardiologist  LABS: Labs reviewed: Acceptable for surgery. (all labs ordered are listed, but only abnormal results are displayed)  Labs Reviewed  GLUCOSE, CAPILLARY - Abnormal; Notable for the following components:      Result Value   Glucose-Capillary 116 (*)    All other components within normal limits  SURGICAL PCR SCREEN  CBC     IMAGES:   EKG:   CV: Echo 10/31/2020 1. The aortic valve is tricuspid. There is mild calcification of the  aortic valve. There is mild thickening of the aortic valve. Aortic valve  regurgitation is not visualized. Mild aortic valve stenosis. Aortic valve  area, by VTI measures 1.66 cm.  Aortic valve mean gradient measures 10.5 mmHg. Aortic valve Vmax measures  2.29 m/s.   2. Left ventricular ejection fraction, by estimation, is 60 to 65%. The  left ventricle has normal function. The left ventricle has no regional  wall motion abnormalities. There is mild concentric left ventricular  hypertrophy. Left ventricular diastolic  parameters are consistent with Grade I diastolic dysfunction (impaired  relaxation).   3. Right  ventricular systolic function is normal. The right ventricular  size is normal. There is normal pulmonary artery systolic pressure. The  estimated right ventricular systolic pressure is 26.2 mmHg.   4. The mitral valve is grossly normal. Trivial mitral valve  regurgitation. No evidence of mitral stenosis.   5. The inferior vena cava is normal in size with greater than 50%  respiratory variability, suggesting right atrial pressure of 3 mmHg.   Cardiac Cath 04/02/2018 Mid RCA lesion is 70% stenosed. Dist Cx-1 lesion is 65% stenosed. Dist Cx-2 lesion is 70% stenosed. 1st Diag lesion is 75% stenosed. The left ventricular systolic function is normal. LV end diastolic pressure is normal. The left ventricular ejection fraction is 55-65% by visual estimate.   1. Modest CAD involving a small first diagonal, distal LCx and nondominant RCA 2. Normal LV function 3. Normal LVEDP   Plan: continue medical management. Patient is a candidate for DC later today. She is cleared for knee surgery next week. Recommend follow up with Dr. Verdis Prime as outpatient.   Myocardial Perfusion 01/02/2018 Nuclear stress EF: 65%. The left ventricular ejection fraction is normal (55-65%). The patient had an ejection of Lexiscan. There were no ST or T wave changes following the injection. This is a low risk study. There is no evidence of ischemia and no evidence of previous infarction. Left ventricular systolic function is normal The study is normal. Past Medical History:  Diagnosis Date   Anemia    Anxiety    Arthritis    osteoarthritis - shoulder, knees & hips   Asthma    Coronary artery disease    Depression    Diabetes mellitus without complication (HCC)    no meds   Fibromyalgia    Heart murmur    History of hiatal hernia    Hyperparathyroidism (HCC)    Hypertension    Dr. Terrilee Files manages BP, pt. reports MD has not found a need for treatment    Hypothyroidism    Polymyalgia rheumatica (HCC)     Sleep difficulties    had sleep study -2009, prior to gastric surgery, told that there was not a need for f/u   Thyroid disease    Vitamin B 12 deficiency     Past Surgical History:  Procedure Laterality Date   ABDOMINAL HYSTERECTOMY     BARIATRIC SURGERY     CARDIAC CATHETERIZATION     [facility]- 30 yrs. ago   CATARACT EXTRACTION, BILATERAL     late 2018   LEFT HEART CATH AND CORONARY ANGIOGRAPHY N/A 04/02/2018   Procedure: LEFT HEART CATH AND CORONARY ANGIOGRAPHY;  Surgeon: Swaziland, Peter M, MD;  Location: [facility] INVASIVE CV LAB;  Service: Cardiovascular;  Laterality: N/A;   OOPHORECTOMY     pantallor arthrodesis with rod placement left foot     PARATHYROIDECTOMY Left 03/16/2021   Procedure: LEFT INFERIOR PARATHYROIDECTOMY;  Surgeon: Darnell Level, MD;  Location: [facility];  Service: General;  Laterality: Left;   TONSILLECTOMY     TOTAL KNEE ARTHROPLASTY Left 11/30/2018   Procedure: TOTAL KNEE ARTHROPLASTY;  Surgeon: Ollen Gross, MD;  Location: [facility];  Service: Orthopedics;  Laterality: Left;   TOTAL KNEE ARTHROPLASTY Right 06/28/2019   Procedure: TOTAL KNEE ARTHROPLASTY;  Surgeon: Ollen Gross, MD;  Location[facility];  Service: Orthopedics;  Laterality: Right;    TOTAL SHOULDER ARTHROPLASTY Left 03/12/2012   Procedure: TOTAL SHOULDER ARTHROPLASTY;  Surgeon: Senaida Lange, MD;  Location: [facility];  Service: Orthopedics;  Laterality: Left;    MEDICATIONS:  albuterol (VENTOLIN HFA) 108 (90 Base) MCG/ACT inhaler   amLODipine (NORVASC) 10 MG tablet   Biotin 5000 MCG CAPS   Blood Glucose Monitoring Suppl (TRUE METRIX AIR GLUCOSE METER) DEVI   Cholecalciferol (VITAMIN D-3) 125 MCG (5000 UT) TABS   cyanocobalamin (,VITAMIN B-12,) 1000 MCG/ML injection   desvenlafaxine (PRISTIQ) 100 MG 24 hr tablet   diclofenac (VOLTAREN) 75 MG EC tablet   Evolocumab (REPATHA SURECLICK) 140 MG/ML SOAJ   FeFum-FePo-FA-B Cmp-C-Zn-Mn-Cu (SE-TAN PLUS) 162-115.2-1 MG CAPS   fluticasone (FLONASE) 50 MCG/ACT nasal  spray   glucose blood test strip   HYDROcodone-acetaminophen (NORCO/VICODIN) 5-325 MG tablet   Levothyroxine Sodium 150 MCG CAPS   loperamide (IMODIUM) 2 MG capsule   nitroGLYCERIN (NITROSTAT) 0.4 MG SL tablet   Omega-3 Fatty Acids (FISH OIL PO)   Propylene Glycol (SYSTANE COMPLETE OP)   Semaglutide, 1 MG/DOSE, 4 MG/3ML SOPN   TRUEplus Lancets 30G MISC   No current facility-administered medications for this encounter.    Jodell Cipro Ward, PA-C [facility] [phone]

## 2022-12-25 ENCOUNTER — Encounter: Payer: Self-pay | Admitting: Pharmacist

## 2022-12-25 NOTE — Telephone Encounter (Signed)
Pt dropped off a copy of these form . Placed in front desk folders under letter "I" .

## 2022-12-25 NOTE — Progress Notes (Signed)
   12/17/2022 Name: [name] MRN: [id number] DOB: 07/28/1940  Chief Complaint  Patient presents with   Medication Management    [name] is a 82 y.o. year old female who presented for a telephone visit.   They were referred to the pharmacist by their PCP for assistance in managing medication access.   Subjective: Clinical Pharmacist received request for advice from Dr [facility] CMA regarding assistance with Ozempic. Patient has been getting Ozempic 0.5mg  weekly from [employer] patient assistance program for 2024.  Dose was increased to 1mg  weekly November 5th but patient has not started yet due to upcoming surgery.  She will start Ozempic 1mg  after surgery.  Updated Rx has been sent to [employer] patient assistance program.  Today patient reports she received 2 boxes of Ozempic 1mg  from [employer].  She would like to increase dose due to experiencing a plateau effect on her appetite and weight loss. Patient is also due to reapply for [employer] patient assistance program for 2025.   Patient reports affordability concerns with their medications: Yes  - cost of Ozempic it too high once in Medicare coverage gap.  Patient reports access/transportation concerns to their pharmacy: No  Patient reports adherence concerns with their medications:  No     Started Ozempic 01/2022  Starting Weight = 225 lbs Starting BMI = 42.51 Staring A1c = 6.7% Last weight = 216 lbs Last BMI= 40.8 Last A1c = 6.1% Total weight loss to date = 9 lbs   Objective:  Lab Results  Component Value Date   HGBA1C 6.1 07/11/2022    Lab Results  Component Value Date   CREATININE 0.94 09/03/2022   BUN 11 09/03/2022   NA 134 (L) 09/03/2022   K 4.1 09/03/2022   CL 95 (L) 09/03/2022   CO2 31 09/03/2022    Lab Results  Component Value Date   CHOL 155 01/08/2022   HDL 59.90 01/08/2022   LDLCALC 62 01/08/2022   LDLDIRECT 60.0 05/31/2019   TRIG 76 03/01/2022   CHOLHDL 3 01/08/2022      Assessment/Plan:   Diabetes / Obesity: Diabetes is currently controlled and weight has improved some. She has not started Ozempic 1mg  dose yet.  - Recommend to start Ozempic to 1mg  weekly after your surgery. - Continues to meet financial criteria for Ozempic patient assistance program through [employer]. Will collaborate with provider, CPhT, and patient to re-enroll for medication assistance program for 2025. Encouraged patient to complete 2025 application for [employer]  Follow Up Plan: 2 to 3  weeks to check on 2025 patient assistance program application  [name], PharmD Clinical Pharmacist [facility]  Population Health [phone]

## 2022-12-26 ENCOUNTER — Ambulatory Visit (HOSPITAL_COMMUNITY): Payer: Medicare HMO | Admitting: Physician Assistant

## 2022-12-26 ENCOUNTER — Encounter: Payer: Self-pay | Admitting: Pharmacist

## 2022-12-26 ENCOUNTER — Ambulatory Visit (HOSPITAL_COMMUNITY): Payer: Medicare HMO | Admitting: Registered Nurse

## 2022-12-26 ENCOUNTER — Other Ambulatory Visit: Payer: Self-pay

## 2022-12-26 ENCOUNTER — Ambulatory Visit (HOSPITAL_COMMUNITY)
Admission: RE | Admit: 2022-12-26 | Discharge: 2022-12-26 | Disposition: A | Discharge status: Home / Self Care | Payer: Medicare HMO | Attending: Orthopedic Surgery | Admitting: Orthopedic Surgery

## 2022-12-26 ENCOUNTER — Encounter (HOSPITAL_COMMUNITY): Payer: Self-pay | Admitting: Orthopedic Surgery

## 2022-12-26 ENCOUNTER — Encounter (HOSPITAL_COMMUNITY)
Admission: RE | Disposition: A | Discharge status: Home / Self Care | Payer: Self-pay | Source: Home / Self Care | Attending: Orthopedic Surgery

## 2022-12-26 DIAGNOSIS — M19011 Primary osteoarthritis, right shoulder: Secondary | ICD-10-CM | POA: Diagnosis not present

## 2022-12-26 DIAGNOSIS — E039 Hypothyroidism, unspecified: Secondary | ICD-10-CM | POA: Diagnosis not present

## 2022-12-26 DIAGNOSIS — M75102 Unspecified rotator cuff tear or rupture of left shoulder, not specified as traumatic: Secondary | ICD-10-CM | POA: Diagnosis not present

## 2022-12-26 DIAGNOSIS — G8918 Other acute postprocedural pain: Secondary | ICD-10-CM | POA: Diagnosis not present

## 2022-12-26 DIAGNOSIS — E119 Type 2 diabetes mellitus without complications: Secondary | ICD-10-CM | POA: Insufficient documentation

## 2022-12-26 DIAGNOSIS — I10 Essential (primary) hypertension: Secondary | ICD-10-CM | POA: Insufficient documentation

## 2022-12-26 DIAGNOSIS — Z7985 Long-term (current) use of injectable non-insulin antidiabetic drugs: Secondary | ICD-10-CM | POA: Diagnosis not present

## 2022-12-26 DIAGNOSIS — Z87891 Personal history of nicotine dependence: Secondary | ICD-10-CM | POA: Insufficient documentation

## 2022-12-26 DIAGNOSIS — I2510 Atherosclerotic heart disease of native coronary artery without angina pectoris: Secondary | ICD-10-CM | POA: Insufficient documentation

## 2022-12-26 DIAGNOSIS — M25711 Osteophyte, right shoulder: Secondary | ICD-10-CM | POA: Diagnosis not present

## 2022-12-26 HISTORY — PX: REVERSE SHOULDER ARTHROPLASTY: SHX5054

## 2022-12-26 LAB — GLUCOSE, CAPILLARY
Glucose-Capillary: 96 mg/dL (ref 70–99)
Glucose-Capillary: 179 mg/dL — ABNORMAL HIGH (ref 70–99)

## 2022-12-26 SURGERY — ARTHROPLASTY, SHOULDER, TOTAL, REVERSE
Anesthesia: General | Site: Shoulder | Laterality: Right

## 2022-12-26 MED ORDER — LACTATED RINGERS IV SOLN: INTRAVENOUS | Status: DC

## 2022-12-26 MED ORDER — ROCURONIUM BROMIDE 10 MG/ML (PF) SYRINGE
PREFILLED_SYRINGE | INTRAVENOUS | Status: AC
  Filled 2022-12-26: qty 10

## 2022-12-26 MED ORDER — ACETAMINOPHEN 10 MG/ML IV SOLN: 1000.0000 mg | Freq: Once | INTRAVENOUS | Status: DC | PRN

## 2022-12-26 MED ORDER — LIDOCAINE HCL (PF) 2 % IJ SOLN
INTRAMUSCULAR | Status: AC
  Filled 2022-12-26: qty 5

## 2022-12-26 MED ORDER — ACETAMINOPHEN 10 MG/ML IV SOLN
INTRAVENOUS | Status: AC
  Filled 2022-12-26: qty 100

## 2022-12-26 MED ORDER — PROPOFOL 10 MG/ML IV BOLUS
INTRAVENOUS | Status: DC | PRN
  Administered 2022-12-26: 130 mg via INTRAVENOUS

## 2022-12-26 MED ORDER — DEXAMETHASONE SODIUM PHOSPHATE 10 MG/ML IJ SOLN
INTRAMUSCULAR | Status: DC | PRN
  Administered 2022-12-26: 8 mg via INTRAVENOUS

## 2022-12-26 MED ORDER — METHOCARBAMOL 500 MG PO TABS: 500.0000 mg | ORAL_TABLET | Freq: Three times a day (TID) | ORAL | 1 refills | Status: DC | PRN

## 2022-12-26 MED ORDER — PHENYLEPHRINE HCL-NACL 20-0.9 MG/250ML-% IV SOLN
INTRAVENOUS | Status: DC | PRN
  Administered 2022-12-26: 25 ug/min via INTRAVENOUS

## 2022-12-26 MED ORDER — FENTANYL CITRATE (PF) 100 MCG/2ML IJ SOLN
INTRAMUSCULAR | Status: AC
  Filled 2022-12-26: qty 2

## 2022-12-26 MED ORDER — ORAL CARE MOUTH RINSE: 15.0000 mL | Freq: Once | OROMUCOSAL | Status: AC

## 2022-12-26 MED ORDER — SUGAMMADEX SODIUM 200 MG/2ML IV SOLN
INTRAVENOUS | Status: DC | PRN
  Administered 2022-12-26: 200 mg via INTRAVENOUS

## 2022-12-26 MED ORDER — OXYCODONE HCL 5 MG PO TABS
ORAL_TABLET | ORAL | Status: AC
  Filled 2022-12-26: qty 1

## 2022-12-26 MED ORDER — CHLORHEXIDINE GLUCONATE 0.12 % MT SOLN
15.0000 mL | Freq: Once | OROMUCOSAL | Status: AC
  Administered 2022-12-26: 15 mL via OROMUCOSAL

## 2022-12-26 MED ORDER — CEFAZOLIN SODIUM-DEXTROSE 2-4 GM/100ML-% IV SOLN
2.0000 g | INTRAVENOUS | Status: AC
  Administered 2022-12-26: 2 g via INTRAVENOUS
  Filled 2022-12-26: qty 100

## 2022-12-26 MED ORDER — BUPIVACAINE HCL (PF) 0.5 % IJ SOLN
INTRAMUSCULAR | Status: DC | PRN
  Administered 2022-12-26: 12 mL via PERINEURAL

## 2022-12-26 MED ORDER — FENTANYL CITRATE (PF) 100 MCG/2ML IJ SOLN
INTRAMUSCULAR | Status: DC | PRN
  Administered 2022-12-26 (×3): 50 ug via INTRAVENOUS

## 2022-12-26 MED ORDER — DEXAMETHASONE SODIUM PHOSPHATE 10 MG/ML IJ SOLN
INTRAMUSCULAR | Status: AC
Start: 2022-12-26 — End: ?
  Filled 2022-12-26: qty 1

## 2022-12-26 MED ORDER — 0.9 % SODIUM CHLORIDE (POUR BTL) OPTIME
TOPICAL | Status: DC | PRN
  Administered 2022-12-26: 1000 mL

## 2022-12-26 MED ORDER — VANCOMYCIN HCL 1000 MG IV SOLR
INTRAVENOUS | Status: DC | PRN
  Administered 2022-12-26: 1000 mg via TOPICAL

## 2022-12-26 MED ORDER — ONDANSETRON HCL 4 MG PO TABS: 4.0000 mg | ORAL_TABLET | Freq: Three times a day (TID) | ORAL | 0 refills | Status: DC | PRN

## 2022-12-26 MED ORDER — FENTANYL CITRATE PF 50 MCG/ML IJ SOSY
PREFILLED_SYRINGE | INTRAMUSCULAR | Status: AC
  Filled 2022-12-26: qty 2

## 2022-12-26 MED ORDER — ACETAMINOPHEN 10 MG/ML IV SOLN
INTRAVENOUS | Status: DC | PRN
  Administered 2022-12-26: 1000 mg via INTRAVENOUS

## 2022-12-26 MED ORDER — ACETAMINOPHEN 160 MG/5ML PO SOLN: 325.0000 mg | ORAL | Status: DC | PRN

## 2022-12-26 MED ORDER — ROCURONIUM BROMIDE 10 MG/ML (PF) SYRINGE
PREFILLED_SYRINGE | INTRAVENOUS | Status: DC | PRN
  Administered 2022-12-26: 60 mg via INTRAVENOUS
  Administered 2022-12-26: 10 mg via INTRAVENOUS

## 2022-12-26 MED ORDER — STERILE WATER FOR IRRIGATION IR SOLN
Status: DC | PRN
  Administered 2022-12-26: 1000 mL

## 2022-12-26 MED ORDER — TRANEXAMIC ACID-NACL 1000-0.7 MG/100ML-% IV SOLN
1000.0000 mg | INTRAVENOUS | Status: AC
  Administered 2022-12-26: 1000 mg via INTRAVENOUS
  Filled 2022-12-26: qty 100

## 2022-12-26 MED ORDER — ONDANSETRON HCL 4 MG/2ML IJ SOLN
INTRAMUSCULAR | Status: AC
  Filled 2022-12-26: qty 2

## 2022-12-26 MED ORDER — FENTANYL CITRATE PF 50 MCG/ML IJ SOSY
25.0000 ug | PREFILLED_SYRINGE | INTRAMUSCULAR | Status: DC | PRN
  Administered 2022-12-26 (×2): 50 ug via INTRAVENOUS

## 2022-12-26 MED ORDER — INSULIN ASPART 100 UNIT/ML IJ SOLN: 0.0000 [IU] | INTRAMUSCULAR | Status: DC | PRN

## 2022-12-26 MED ORDER — VANCOMYCIN HCL 1000 MG IV SOLR
INTRAVENOUS | Status: AC
  Filled 2022-12-26: qty 20

## 2022-12-26 MED ORDER — OXYCODONE-ACETAMINOPHEN 5-325 MG PO TABS: 1.0000 | ORAL_TABLET | ORAL | 0 refills | Status: DC | PRN

## 2022-12-26 MED ORDER — BUPIVACAINE LIPOSOME 1.3 % IJ SUSP
INTRAMUSCULAR | Status: DC | PRN
  Administered 2022-12-26: 10 mL via PERINEURAL

## 2022-12-26 MED ORDER — LIDOCAINE 2% (20 MG/ML) 5 ML SYRINGE
INTRAMUSCULAR | Status: DC | PRN
  Administered 2022-12-26: 40 mg via INTRAVENOUS

## 2022-12-26 MED ORDER — ACETAMINOPHEN 325 MG PO TABS: 325.0000 mg | ORAL_TABLET | ORAL | Status: DC | PRN

## 2022-12-26 MED ORDER — OXYCODONE HCL 5 MG PO TABS
5.0000 mg | ORAL_TABLET | Freq: Once | ORAL | Status: AC | PRN
  Administered 2022-12-26: 5 mg via ORAL

## 2022-12-26 MED ORDER — OXYCODONE HCL 5 MG/5ML PO SOLN: 5.0000 mg | Freq: Once | ORAL | Status: AC | PRN

## 2022-12-26 MED ORDER — ACETAMINOPHEN 500 MG PO TABS
ORAL_TABLET | ORAL | Status: AC
  Filled 2022-12-26: qty 2

## 2022-12-26 MED ORDER — PROPOFOL 10 MG/ML IV BOLUS
INTRAVENOUS | Status: AC
  Filled 2022-12-26: qty 20

## 2022-12-26 MED ORDER — ONDANSETRON HCL 4 MG/2ML IJ SOLN
INTRAMUSCULAR | Status: DC | PRN
  Administered 2022-12-26: 4 mg via INTRAVENOUS

## 2022-12-26 MED ORDER — EPHEDRINE SULFATE-NACL 50-0.9 MG/10ML-% IV SOSY
PREFILLED_SYRINGE | INTRAVENOUS | Status: DC | PRN
  Administered 2022-12-26: 5 mg via INTRAVENOUS

## 2022-12-26 MED ORDER — DROPERIDOL 2.5 MG/ML IJ SOLN: 0.6250 mg | Freq: Once | INTRAMUSCULAR | Status: DC | PRN

## 2022-12-26 SURGICAL SUPPLY — 63 items
BAG COUNTER SPONGE SURGICOUNT (BAG) IMPLANT
BAG ZIPLOCK 12X15 (MISCELLANEOUS) ×1 IMPLANT
BIT DRILL AR 3 NS (BIT) IMPLANT
BLADE SAW SGTL 83.5X18.5 (BLADE) ×1 IMPLANT
BNDG COHESIVE 4X5 TAN STRL LF (GAUZE/BANDAGES/DRESSINGS) ×1 IMPLANT
COOLER ICEMAN CLASSIC (MISCELLANEOUS) ×1 IMPLANT
COVER BACK TABLE 60X90IN (DRAPES) ×1 IMPLANT
COVER SURGICAL LIGHT HANDLE (MISCELLANEOUS) ×1 IMPLANT
CUP SUT UNIV REVERS 36 NEUTRAL (Cup) IMPLANT
DERMABOND ADVANCED .7 DNX12 (GAUZE/BANDAGES/DRESSINGS) ×1 IMPLANT
DRAPE SHEET LG 3/4 BI-LAMINATE (DRAPES) ×1 IMPLANT
DRAPE SURG 17X11 SM STRL (DRAPES) ×1 IMPLANT
DRAPE SURG ORHT 6 SPLT 77X108 (DRAPES) ×2 IMPLANT
DRAPE TOP 10253 STERILE (DRAPES) ×1 IMPLANT
DRAPE U-SHAPE 47X51 STRL (DRAPES) ×1 IMPLANT
DRESSING AQUACEL AG SP 3.5X6 (GAUZE/BANDAGES/DRESSINGS) ×1 IMPLANT
DRSG AQUACEL AG ADV 3.5X 6 (GAUZE/BANDAGES/DRESSINGS) IMPLANT
DRSG AQUACEL AG ADV 3.5X10 (GAUZE/BANDAGES/DRESSINGS) IMPLANT
DRSG AQUACEL AG SP 3.5X6 (GAUZE/BANDAGES/DRESSINGS) ×1
DURAPREP 26ML APPLICATOR (WOUND CARE) ×1 IMPLANT
ELECT BLADE TIP CTD 4 INCH (ELECTRODE) ×1 IMPLANT
ELECT PENCIL ROCKER SW 15FT (MISCELLANEOUS) ×1 IMPLANT
ELECT REM PT RETURN 15FT ADLT (MISCELLANEOUS) ×1 IMPLANT
FACESHIELD WRAPAROUND (MASK) ×5 IMPLANT
FACESHIELD WRAPAROUND OR TEAM (MASK) ×5 IMPLANT
GLENOID UNI REV MOD 24 +2 LAT (Joint) IMPLANT
GLENOSPHERE 36 +4 LAT/24 (Joint) IMPLANT
GLOVE BIO SURGEON STRL SZ7.5 (GLOVE) ×1 IMPLANT
GLOVE BIO SURGEON STRL SZ8 (GLOVE) ×1 IMPLANT
GLOVE SS BIOGEL STRL SZ 7 (GLOVE) ×1 IMPLANT
GLOVE SS BIOGEL STRL SZ 7.5 (GLOVE) ×1 IMPLANT
GOWN STRL SURGICAL XL XLNG (GOWN DISPOSABLE) ×2 IMPLANT
KIT BASIN OR (CUSTOM PROCEDURE TRAY) ×1 IMPLANT
KIT TURNOVER KIT A (KITS) IMPLANT
LINER HUMERAL 36 +3MM SM (Shoulder) IMPLANT
MANIFOLD NEPTUNE II (INSTRUMENTS) ×1 IMPLANT
NDL TAPERED W/ NITINOL LOOP (MISCELLANEOUS) ×1 IMPLANT
NEEDLE TAPERED W/ NITINOL LOOP (MISCELLANEOUS) ×1 IMPLANT
NS IRRIG 1000ML POUR BTL (IV SOLUTION) ×1 IMPLANT
PACK SHOULDER (CUSTOM PROCEDURE TRAY) ×1 IMPLANT
PAD ARMBOARD 7.5X6 YLW CONV (MISCELLANEOUS) ×1 IMPLANT
PAD COLD SHLDR WRAP-ON (PAD) ×1 IMPLANT
PIN NITINOL TARGETER 2.8 (PIN) IMPLANT
PIN SET MODULAR GLENOID SYSTEM (PIN) IMPLANT
RESTRAINT HEAD UNIVERSAL NS (MISCELLANEOUS) ×1 IMPLANT
SCREW CENTRAL MODULAR 25 (Screw) IMPLANT
SCREW PERI LOCK 5.5X16 (Screw) IMPLANT
SCREW PERI LOCK 5.5X24 (Screw) IMPLANT
SCREW PERIPHERAL 5.5X20 LOCK (Screw) IMPLANT
SCREW PERIPHERAL 5.5X28 LOCK (Screw) IMPLANT
SLING ARM FOAM STRAP LRG (SOFTGOODS) IMPLANT
SLING ARM FOAM STRAP MED (SOFTGOODS) IMPLANT
STEM HUMERAL UNI REVERS SZ9 (Stem) IMPLANT
SUT MNCRL AB 3-0 PS2 18 (SUTURE) ×1 IMPLANT
SUT MON AB 2-0 CT1 36 (SUTURE) ×1 IMPLANT
SUT VIC AB 1 CT1 36 (SUTURE) ×1 IMPLANT
SUTURE TAPE 1.3 40 TPR END (SUTURE) ×2 IMPLANT
SUTURETAPE 1.3 40 TPR END (SUTURE) ×2
TOWEL OR 17X26 10 PK STRL BLUE (TOWEL DISPOSABLE) ×1 IMPLANT
TOWEL OR NON WOVEN STRL DISP B (DISPOSABLE) ×1 IMPLANT
TUBE SUCTION HIGH CAP CLEAR NV (SUCTIONS) ×1 IMPLANT
TUBING CONNECTING 10 (TUBING) ×1 IMPLANT
WATER STERILE IRR 1000ML POUR (IV SOLUTION) ×2 IMPLANT

## 2022-12-26 NOTE — H&P (Signed)
[name]    Chief Complaint: Right shoulder osteoarthritis with rotator cuff dysfunction HPI: The patient is a 82 y.o. female with chronic and progressive increasing right shoulder pain related to severe rotator cuff tear arthropathy.  Due to her increasing functional imitations and failure to respond to prolonged attempts of conservative management, she is brought to the operating this time for planned right shoulder reverse arthroplasty  Past Medical History:  Diagnosis Date   Anemia    Anxiety    Arthritis    osteoarthritis - shoulder, knees & hips   Asthma    Coronary artery disease    Depression    Diabetes mellitus without complication (HCC)    no meds   Fibromyalgia    Heart murmur    History of hiatal hernia    Hyperparathyroidism (HCC)    Hypertension    Dr. Terrilee Files manages BP, pt. reports MD has not found a need for treatment    Hypothyroidism    Polymyalgia rheumatica (HCC)    Sleep difficulties    had sleep study -2009, prior to gastric surgery, told that there was not a need for f/u   Thyroid disease    Vitamin B 12 deficiency       Past Surgical History:  Procedure Laterality Date   ABDOMINAL HYSTERECTOMY     BARIATRIC SURGERY     CARDIAC CATHETERIZATION     [facility]- 30 yrs. ago   CATARACT EXTRACTION, BILATERAL     late 2018   LEFT HEART CATH AND CORONARY ANGIOGRAPHY N/A 04/02/2018   Procedure: LEFT HEART CATH AND CORONARY ANGIOGRAPHY;  Surgeon: Swaziland, Peter M, MD;  Location: [facility] INVASIVE CV LAB;  Service: Cardiovascular;  Laterality: N/A;   OOPHORECTOMY     pantallor arthrodesis with rod placement left foot     PARATHYROIDECTOMY Left 03/16/2021   Procedure: LEFT INFERIOR PARATHYROIDECTOMY;  Surgeon: Darnell Level, MD;  Location: [facility];  Service: General;  Laterality: Left;   TONSILLECTOMY     TOTAL KNEE ARTHROPLASTY Left 11/30/2018   Procedure: TOTAL KNEE ARTHROPLASTY;  Surgeon: Ollen Gross, MD;  Location: [facility];  Service: Orthopedics;   Laterality: Left;   TOTAL KNEE ARTHROPLASTY Right 06/28/2019   Procedure: TOTAL KNEE ARTHROPLASTY;  Surgeon: Ollen Gross, MD;  Location: [facility];  Service: Orthopedics;  Laterality: Right;    TOTAL SHOULDER ARTHROPLASTY Left 03/12/2012   Procedure: TOTAL SHOULDER ARTHROPLASTY;  Surgeon: Senaida Lange, MD;  Location[facility];  Service: Orthopedics;  Laterality: Left;    Family History  Problem Relation Age of Onset   Stroke Mother    Liver cancer Father    Cancer Father        liver   Breast cancer Neg Hx     Social History:  reports that she quit smoking about 44 years ago. Her smoking use included cigarettes. She started smoking about 64 years ago. She has a 14 pack-year smoking history. She has never used smokeless tobacco. She reports current alcohol use of about 2.0 standard drinks of alcohol per week. She reports that she does not use drugs.  BMI: Estimated body mass index is 41.19 kg/m as calculated from the following:   Height as of this encounter: 5\' 1"  (1.549 m).   Weight as of this encounter: 98.9 kg.  Lab Results  Component Value Date   ALBUMIN 4.2 07/11/2022   Diabetes:   Patient has a diagnosis of diabetes,  Lab Results  Component Value Date   HGBA1C  6.1 07/11/2022   Smoking Status:       Medications Prior to Admission  Medication Sig Dispense Refill   acetaminophen (TYLENOL) 500 MG tablet Take 500 mg by mouth every 6 (six) hours as needed for mild pain (pain score 1-3) or moderate pain (pain score 4-6).     amLODipine (NORVASC) 10 MG tablet TAKE 1 TABLET EVERY DAY 90 tablet 2   Biotin 5000 MCG CAPS Take 5,000 mcg by mouth daily.     Cholecalciferol (VITAMIN D-3) 125 MCG (5000 UT) TABS Take 5,000 Units by mouth daily.     desvenlafaxine (PRISTIQ) 100 MG 24 hr tablet TAKE 1 TABLET EVERY DAY 90 tablet 3   diclofenac (VOLTAREN) 75 MG EC tablet Take 1 tablet (75 mg total) by mouth 2 (two) times daily. 180 tablet 3   Evolocumab (REPATHA SURECLICK) 140 MG/ML  SOAJ Inject 140 mg into the skin every 14 (fourteen) days. 6 mL 3   FeFum-FePo-FA-B Cmp-C-Zn-Mn-Cu (SE-TAN PLUS) 162-115.2-1 MG CAPS Take 1 capsule by mouth 2 (two) times daily. 180 capsule 3   fluticasone (FLONASE) 50 MCG/ACT nasal spray Place 1 spray into both nostrils daily.     HYDROcodone-acetaminophen (NORCO/VICODIN) 5-325 MG tablet Take 2 tablets by mouth every 6 (six) hours as needed for moderate pain (pain score 4-6). 30 tablet 0   Levothyroxine Sodium 150 MCG CAPS Take 150 mcg by mouth daily. Tirosint     loperamide (IMODIUM) 2 MG capsule Take 4 mg by mouth daily as needed for diarrhea or loose stools.      Omega-3 Fatty Acids (FISH OIL PO) Take 5,000 mg by mouth daily.     Propylene Glycol (SYSTANE COMPLETE OP) Place 1 drop into both eyes in the morning.     Semaglutide, 1 MG/DOSE, 4 MG/3ML SOPN Inject 1 mg as directed once a week. 3 mL 4   albuterol (VENTOLIN HFA) 108 (90 Base) MCG/ACT inhaler Inhale 1-2 puffs into the lungs every 4 (four) hours as needed for wheezing or shortness of breath. 18 g 2   Blood Glucose Monitoring Suppl (TRUE METRIX AIR GLUCOSE METER) DEVI Use to test blood sugars daily. Dx: E11.9 1 each 5   cyanocobalamin (,VITAMIN B-12,) 1000 MCG/ML injection INJECT (1,000 MCG TOTAL)  INTO THE MUSCLE EVERY 30 DAYS USE ON A MONTHLY BASES (Patient not taking: Reported on 09/20/2022) 10 mL 0   glucose blood test strip Test blood sugar once per day 100 each 3   nitroGLYCERIN (NITROSTAT) 0.4 MG SL tablet Place 1 tablet (0.4 mg total) under the tongue every 5 (five) minutes as needed for chest pain. 50 tablet 3   TRUEplus Lancets 30G MISC Check blood sugar once per day 100 each 3     Physical Exam: Right shoulder demonstrates painful and guarded motion as noted at her recent office visits.  She has globally decreased range to manual muscle testing.  Otherwise neurovascular intact in the right upper extremity.  Radiographs  Imaging studies confirm changes consistent with a  chronic rotator cuff tear arthropathy of the right shoulder.  Vitals  Weight:  [98.9 kg] 98.9 kg (12/05 0555)  Assessment/Plan  Impression: Right shoulder osteoarthritis with rotator cuff dysfunction  Plan of Action: Procedure(s): REVERSE SHOULDER ARTHROPLASTY  Tylena Prisk M Honey Zakarian 12/26/2022, 6:09 AM Contact # [phone]

## 2022-12-26 NOTE — Discharge Instructions (Signed)
? ?[name], M.D., F.A.A.O.S. ?Orthopaedic Surgery ?Specializing in Arthroscopic and Reconstructive ?Surgery of the Shoulder ?[phone] ?[address] - [place],  [address] - Fax [phone] ? ? ?POST-OP TOTAL SHOULDER REPLACEMENT INSTRUCTIONS ? ?1. Follow up in the office for your first post-op appointment 10-14 days from the date of your surgery. If you do not already have a scheduled appointment, our office will contact you to schedule. ? ?2. The bandage over your incision is waterproof. You may begin showering with this dressing on. You may leave this dressing on until first follow up appointment within 2 weeks. We prefer you leave this dressing in place until follow up however after 5-7 days if you are having itching or skin irritation and would like to remove it you may do so. Go slow and tug at the borders gently to break the bond the dressing has with the skin. At this point if there is no drainage it is okay to go without a bandage or you may cover it with a light guaze and tape. You can also expect significant bruising around your shoulder that will drift down your arm and into your chest wall. This is very normal and should resolve over several days. ? ? 3. Wear your sling/immobilizer at all times except to perform the exercises below or to occasionally let your arm dangle by your side to stretch your elbow. You also need to sleep in your sling immobilizer until instructed otherwise. It is ok to remove your sling if you are sitting in a controlled environment and allow your arm to rest in a position of comfort by your side or on your lap with pillows to give your neck and skin a break from the sling. You may remove it to allow arm to dangle by side to shower. If you are up walking around and when you go to sleep at night you need to wear it. ? ?4. Range of motion to your elbow, wrist, and hand are encouraged 3-5 times daily. Exercise to your hand and fingers helps to reduce  swelling you may experience. ? ? ?5. Prescriptions for a pain medication and a muscle relaxant are provided for you. It is recommended that if you are experiencing pain that you pain medication alone is not controlling, add the muscle relaxant along with the pain medication which can give additional pain relief. The first 1-2 days is generally the most severe of your pain and then should gradually decrease. As your pain lessens it is recommended that you decrease your use of the pain medications to an "as needed basis'" only and to always comply with the recommended dosages of the pain medications. ? ?6. Pain medications can produce constipation along with their use. If you experience this, the use of an over the counter stool softener or laxative daily is recommended.  ? ?7. For additional questions or concerns, please do not hesitate to call the office. If after hours there is an answering service to forward your concerns to the physician on call. ? ?8.Pain control following an exparel block ? ?To help control your post-operative pain you received a nerve block  performed with Exparel which is a long acting anesthetic (numbing agent) which can provide pain relief and sensations of numbness (and relief of pain) in the operative shoulder and arm for up to 3 days. Sometimes it provides mixed relief, meaning you may still have numbness in certain areas of the arm but can still be able to   move  parts of that arm, hand, and fingers. We recommend that your prescribed pain medications  be used as needed. We do not feel it is necessary to "pre medicate" and "stay ahead" of pain.  Taking narcotic pain medications when you are not having any pain can lead to unnecessary and potentially dangerous side effects.   ? ?9. Use the ice machine as much as possible in the first 5-7 days from surgery, then you can wean its use to as needed. The ice typically needs to be replaced every 6 hours, instead of ice you can actually freeze  water bottles to put in the cooler and then fill water around them to avoid having to purchase ice. You can have spare water bottles freezing to allow you to rotate them once they have melted. Try to have a thin shirt or light cloth or towel under the ice wrap to protect your skin.  ? ?FOR ADDITIONAL INFO ON ICE MACHINE AND INSTRUCTIONS GO TO THE WEBSITE AT ? ?https://www.djogl[employer].com/products/donjoy/donjoy-iceman-classic3 ? ?10.  We recommend that you avoid any dental work or cleaning in the first 3 months following your joint replacement. This is to help minimize the possibility of infection from the bacteria in your mouth that enters your bloodstream during dental work. We also recommend that you take an antibiotic prior to your dental work for the first year after your shoulder replacement to further help reduce that risk. Please simply contact our office for antibiotics to be sent to your pharmacy prior to dental work. ? ?11. Dental Antibiotics: ? ?We recommend waiting at least 3 months for any dental work even cleanings unless there is a dental emergency. We also recommend  prophylactic antibiotics for all dental procdeures  the first year following your joint replacement. In some exceptions we recommend them to be used lifelong. We will provide you with that prescription in follow up office visits, or you can call our office. ? ?Exceptions are as follows: ? ?1. History of prior total joint infection ? ?2. Severely immunocompromised (Organ Transplant, cancer chemotherapy, Rheumatoid biologic ?meds such as Humera) ? ?3. Poorly controlled diabetes (A1C &gt; 8.0, blood glucose over 200) ? ? ?POST-OP EXERCISES ? ?Pendulum Exercises ? ?Perform pendulum exercises while standing and bending at the waist. Support your uninvolved arm on a table or chair and allow your operated arm to hang freely. Make sure to do these exercises passively - not using you shoulder muscles. These exercises can be performed once your  nerve block effects have worn off. ? ?Repeat 20 times. Do 3 sessions per day. ? ? ?  ?

## 2022-12-26 NOTE — Care Plan (Signed)
Ortho Bundle Case Management Note  Patient Details  Name: [name] MRN: [id number] Date of Birth: Oct 21, 1940                  R Rev TSA on 12-26-22 DCP: Home with dtr DME: No needs PT: EO on 01-28-23   DME Arranged:  N/A DME Agency:       Additional Comments: Please contact me with any questions of if this plan should need to change.  Ennis Forts, RN,CCM [facility]  [phone] 12/26/2022, 1:21 PM

## 2022-12-26 NOTE — Op Note (Signed)
12/26/2022  9:20 AM  PATIENT:   [name]  82 y.o. female  PRE-OPERATIVE DIAGNOSIS:  Right shoulder osteoarthritis with rotator cuff dysfunction  POST-OPERATIVE DIAGNOSIS: Same  PROCEDURE: Right shoulder reverse arthroplasty lysing a press-fit size 9 Arthrex stem with a neutral metathesis, +3 polyethylene insert, 36/+4 glenosphere and a small/+2 baseplate  SURGEON:  Shawntia Mangal, Vania Rea M.D.  ASSISTANTS: Ralene Bathe, PA-C  Ralene Bathe, PA-C was utilized as an Geophysicist/field seismologist throughout this case, essential for help with positioning the patient, positioning extremity, tissue manipulation, implantation of the prosthesis, suture management, wound closure, and intraoperative decision-making.  ANESTHESIA:   General Endotracheal and interscalene block with Exparel  EBL: 150 cc  SPECIMEN: None  Drains: None   PATIENT DISPOSITION:  PACU - hemodynamically stable.    PLAN OF CARE: Discharge to home after PACU  Brief history:  Patient is an 82 year old female well-known to our practice having been followed for many years regarding shoulder arthritis status post a left shoulder anatomic arthroplasty performed a number of years ago which she has done well with.  She now presents with chronic and progressive increasing right shoulder pain related to severe osteoarthritis with associated rotator cuff dysfunction.  Due to her increasing functional imitations and failure to respond to prolonged attempts of conservative management, she is brought to the operating this time for planned right shoulder reverse arthroplasty.  Preoperatively, I counseled the patient regarding treatment options and risks versus benefits thereof.  Possible surgical complications were all reviewed including potential for bleeding, infection, neurovascular injury, persistent pain, loss of motion, anesthetic complication, failure of the implant, and possible need for additional surgery. They understand and accept and  agrees with our planned procedure.   Procedure detail:  After undergoing routine preop evaluation the patient received prophylactic antibiotics and interscalene block with Exparel was established in the holding area by the anesthesia department.  Subsequently placed spine on the operating table and underwent the smooth induction of a general endotracheal anesthesia.  Placed into the beachchair position and appropriately padded and protected.  The right shoulder girdle region was sterilely prepped and draped in standard fashion.  Timeout was called.  A deltopectoral approach was made to the right shoulder through an approximate 12 cm incision.  Skin flaps elevated dissection carried deeply deltopectoral interval was then developed from proximal to distal with the vein taken laterally.  The conjoined tendon was mobilized and retracted medially.  Adhesions were divided beneath the deltoid.  The long head biceps tendon was then tenodesed at the upper border the pectoralis major tendon with the proximal segment unroofed and excised.  The rotator cuff was then split from the apex of the bicipital groove to the base of the coracoid and the subscap was separated from the lesser tuberosity using electrocautery with the free margin then tagged with a pair of grasping suture tape sutures.  Capsular attachments were then divided from the anterior and inferior margins of the humeral neck allowing deliver the humeral head through the wound.   An extra medullary guide was then used to outline the proposed humeral head resection which we performed with an oscillating saw at 20 degrees of retroversion.  Marginal osteophytes were removed with a rondure and a metal cap was placed over the cut proximal humeral surface.  The glenoid was then exposed and a circumferential labral resection was performed.  Guidepin directed into the center of the glenoid and the glenoid was then reamed with the central followed by the peripheral  reamer  to a stable subchondral bony bed in preparation completed with a drill and tapped for a 25 mm lag screw.  Our baseplate was assembled and inserted with vancomycin powder applied to the threads of the lag screw and all the prophylaxes were then placed using standard technique with excellent fixation.  A 36/+4 glenosphere was then impacted onto the baseplate and a central locking screw was placed.  Attention returned to the humeral metaphysis with the canal was opened by hand reaming and we ultimately broached up to a size 9 stem.  A neutral metaphyseal reaming guide was then used to prepare the metaphysis and a trial implant was then placed which showed excellent motion stability and soft tissue balance.  Our trial was then removed.  Our final implant was assembled.  The canal was irrigated cleaned and dried.  Vancomycin powder was then spread into the canal and our final implant was seated with excellent fixation.  Trial reduction of the implant showed good motion stability and soft tissue balance with a +3 poly.  The trial poly was removed.  The implant was cleaned and dried and the final poly was impacted.  Final reduction was performed showing excellent motion stability and soft tissue balance all much to our satisfaction.  The wound was then copious irrigated.  We confirmed good elasticity of the subscapularis which we then repaired back to the eyelets on the collar of the implant and the arm easily achieved 30 degrees of X rotation without excessive tension on the repair.  Final irrigation was completed.  Hemostasis was obtained.  The balance of the vancomycin powder was then sprayed liberally throughout the deep soft tissue planes.  The deltopectoral interval was reapproximated with a series of figure-of-eight 0 Vicryl sutures.  2-0 Monocryl used to close the subcu layer and intracuticular 3-0 Monocryl used to close the skin followed by Dermabond and an Aquacel dressing.  The right arm was then placed  into a sling and the patient was awakened, extubated, and taken to the recovery room in stable condition.  Senaida Lange MD   Contact # [phone]

## 2022-12-26 NOTE — Evaluation (Signed)
Occupational Therapy Evaluation Patient Details Name: [name] MRN: [id number] DOB: [birth year] Today's Date: 12/26/2022   History of Present Illness Pt is an 82 year old woman who underwent R reverse TSA today. PMH: DM, arthritis, anxiety, fibromyalgia, CAD, HTN, hypothyroidism.   Clinical Impression   Pt and daughter educated in positioning R UE in bed and chair, sling use, ice man use, exercises as ordered, compensatory strategies for ADLs and NWB status. Reinforced with written handouts. Pt and daughter verbalized understanding. Pt ambulated to bathroom with her cane and CGA. VSS on RA.       If plan is discharge home, recommend the following: A little help with walking and/or transfers;A lot of help with bathing/dressing/bathroom;Assistance with cooking/housework;Assist for transportation;Help with stairs or ramp for entrance    Functional Status Assessment  Patient has had a recent decline in their functional status and demonstrates the ability to make significant improvements in function in a reasonable and predictable amount of time.  Equipment Recommendations  None recommended by OT    Recommendations for Other Services       Precautions / Restrictions Precautions Precautions: Shoulder Type of Shoulder Precautions: lap slides, pendulums, may use R UE for ADLs, ER 20, FF60, ABD 45 Shoulder Interventions: Shoulder sling/immobilizer;Off for dressing/bathing/exercises Precaution Booklet Issued: Yes (comment) Required Braces or Orthoses: Sling Restrictions Weight Bearing Restrictions: Yes RUE Weight Bearing: Non weight bearing      Mobility Bed Mobility                    Transfers Overall transfer level: Needs assistance Equipment used: Straight cane Transfers: Sit to/from Stand Sit to Stand: Contact guard assist           General transfer comment: slow to rise      Balance                                           ADL  either performed or assessed with clinical judgement   ADL Overall ADL's : Needs assistance/impaired Eating/Feeding: Set up;Sitting   Grooming: Wash/dry hands;Standing;Contact guard assist   Upper Body Bathing: Minimal assistance;Sitting   Lower Body Bathing: Moderate assistance;Sit to/from stand   Upper Body Dressing : Minimal assistance;Sitting   Lower Body Dressing: Moderate assistance;Sit to/from stand   Toilet Transfer: Contact guard assist;Ambulation;Comfort height toilet;Grab bars (with cane)   Toileting- Clothing Manipulation and Hygiene: Contact guard assist;Sit to/from stand       Functional mobility during ADLs: Contact guard assist;Cane       Vision Baseline Vision/History: 1 Wears glasses       Perception         Praxis         Pertinent Vitals/Pain Pain Assessment Pain Assessment: No/denies pain     Extremity/Trunk Assessment Upper Extremity Assessment Upper Extremity Assessment: Right hand dominant   Lower Extremity Assessment Lower Extremity Assessment: Generalized weakness   Cervical / Trunk Assessment Cervical / Trunk Assessment: Normal   Communication     Cognition Arousal: Alert Behavior During Therapy: WFL for tasks assessed/performed Overall Cognitive Status: Within Functional Limits for tasks assessed                                       General Comments  Exercises     Shoulder Instructions      Home Living Family/patient expects to be discharged to:: Private residence Living Arrangements: Alone Available Help at Discharge: Family;Available 24 hours/day Type of Home: Mobile home Home Access: Stairs to enter Entrance Stairs-Number of Steps: 4 Entrance Stairs-Rails: Right Home Layout: One level     Bathroom Shower/Tub: Producer, television/film/video: Standard     Home Equipment: Shower seat;Transport chair;BSC/3in1;Grab bars - tub/shower;Cane - single point   Additional Comments: Tripod  rollator      Prior Functioning/Environment Prior Level of Function : Independent/Modified Independent;Driving             Mobility Comments: walks with a cane in her R hand typically ADLs Comments: Independent with adls and light IADLs; drives        OT Problem List:        OT Treatment/Interventions:      OT Goals(Current goals can be found in the care plan section)    OT Frequency:      Co-evaluation              AM-PAC OT "6 Clicks" Daily Activity     Outcome Measure Help from another person eating meals?: A Little Help from another person taking care of personal grooming?: A Little Help from another person toileting, which includes using toliet, bedpan, or urinal?: A Little Help from another person bathing (including washing, rinsing, drying)?: A Lot Help from another person to put on and taking off regular upper body clothing?: A Little Help from another person to put on and taking off regular lower body clothing?: A Lot 6 Click Score: 16   End of Session Equipment Utilized During Treatment: Other (comment) (cane)  Activity Tolerance: Patient tolerated treatment well Patient left: in chair;with call bell/phone within reach;with family/visitor present  OT Visit Diagnosis: Unsteadiness on feet (R26.81);Muscle weakness (generalized) (M62.81)                Time: 7829-5621 OT Time Calculation (min): 60 min Charges:  OT General Charges $OT Visit: 1 Visit OT Evaluation $OT Eval Moderate Complexity: 1 Mod OT Treatments $Self Care/Home Management : 23-37 mins $Therapeutic Exercise: 8-22 mins  Berna Spare, OTR/L Acute Rehabilitation Services Office: [phone]   Evern Bio 12/26/2022, 1:31 PM

## 2022-12-26 NOTE — Anesthesia Postprocedure Evaluation (Signed)
Anesthesia Post Note  Patient: [name]  Procedure(s) Performed: REVERSE SHOULDER ARTHROPLASTY (Right: Shoulder)     Patient location during evaluation: PACU Anesthesia Type: General Level of consciousness: awake and alert Pain management: pain level controlled Vital Signs Assessment: post-procedure vital signs reviewed and stable Respiratory status: spontaneous breathing, nonlabored ventilation, respiratory function stable and patient connected to nasal cannula oxygen Cardiovascular status: blood pressure returned to baseline and stable Postop Assessment: no apparent nausea or vomiting Anesthetic complications: no   No notable events documented.  Last Vitals:  Vitals:   12/26/22 1200 12/26/22 1319  BP: 132/84 (!) 132/99  Pulse: 72 75  Resp: 12 20  Temp:    SpO2: 90% 93%    Last Pain:  Vitals:   12/26/22 1319  TempSrc:   PainSc: 0-No pain                 Shelton Silvas

## 2022-12-26 NOTE — Progress Notes (Signed)
   12/17/2022 Name: [name] MRN: [id number] DOB: March 05, 1940  Chief Complaint  Patient presents with   Medication Management    [name] is a 82 y.o. year old female. Office received her mailed [employer] application today and patient dropped off a copy yesterday.    They were referred to the pharmacist by their PCP for assistance in managing medication access.   Subjective: Patient is currently taking Ozempic 0.5mg  weekly but plans to start new 1mg  weekly dose after her surgery. She is having shoulder surgery today.  Patient has endorsed that she received 2 boxes of Ozempic 1mg  from [employer] in November.    Patient reports affordability concerns with their medications: Yes  - cost of Ozempic it too high once in Medicare coverage gap.  Patient reports access/transportation concerns to their pharmacy: No  Patient reports adherence concerns with their medications:  No     Started Ozempic 01/2022  Starting Weight = 225 lbs Starting BMI = 42.51 Staring A1c = 6.7% Last weight = 216 lbs (10/2022) Last BMI= 40.8 Last A1c = 6.1% Total weight loss to date = 9 lbs   Objective:  Lab Results  Component Value Date   HGBA1C 6.1 07/11/2022    Lab Results  Component Value Date   CREATININE 0.94 09/03/2022   BUN 11 09/03/2022   NA 134 (L) 09/03/2022   K 4.1 09/03/2022   CL 95 (L) 09/03/2022   CO2 31 09/03/2022    Lab Results  Component Value Date   CHOL 155 01/08/2022   HDL 59.90 01/08/2022   LDLCALC 62 01/08/2022   LDLDIRECT 60.0 05/31/2019   TRIG 76 03/01/2022   CHOLHDL 3 01/08/2022     Assessment/Plan:   Diabetes / Obesity: Diabetes is currently controlled and weight decreased 9lbs - Will start Ozempic to 1mg  weekly after surgery. - Continues to meet financial criteria for Ozempic patient assistance program through [employer]. Reviewed medication assistance program application today, completed missing information on patient portion and  completed provider portion. Forwarded to PCP to review and sign. Will need to fax to [employer] PCP signs.   Follow Up Plan: 2 to 4 weeks to check on 2025 patient assistance program application  [name], PharmD Clinical Pharmacist [facility]  Population Health [phone]

## 2022-12-26 NOTE — Anesthesia Preprocedure Evaluation (Addendum)
Anesthesia Evaluation  Patient identified by MRN, date of birth, ID band Patient awake    Reviewed: Allergy & Precautions, NPO status , Patient's Chart, lab work & pertinent test results  Airway Mallampati: I  TM Distance: >3 FB Neck ROM: Full    Dental  (+) Edentulous Lower, Edentulous Upper   Pulmonary asthma , former smoker   breath sounds clear to auscultation       Cardiovascular hypertension, Pt. on medications + CAD  + Valvular Problems/Murmurs  Rhythm:Regular Rate:Normal     Neuro/Psych  PSYCHIATRIC DISORDERS Anxiety Depression     Neuromuscular disease    GI/Hepatic Neg liver ROS, hiatal hernia,,,  Endo/Other  diabetesHypothyroidism    Renal/GU negative Renal ROS     Musculoskeletal  (+) Arthritis ,  Fibromyalgia -  Abdominal   Peds  Hematology  (+) Blood dyscrasia, anemia   Anesthesia Other Findings   Reproductive/Obstetrics                             Anesthesia Physical Anesthesia Plan  ASA: 3  Anesthesia Plan: General   Post-op Pain Management: Tylenol PO (pre-op)*, Toradol IV (intra-op)* and Regional block*   Induction: Intravenous  PONV Risk Score and Plan: 4 or greater and Ondansetron and Treatment may vary due to age or medical condition  Airway Management Planned: Oral ETT  Additional Equipment: None  Intra-op Plan:   Post-operative Plan: Extubation in OR  Informed Consent: I have reviewed the patients History and Physical, chart, labs and discussed the procedure including the risks, benefits and alternatives for the proposed anesthesia with the patient or authorized representative who has indicated his/her understanding and acceptance.     Dental advisory given  Plan Discussed with: CRNA  Anesthesia Plan Comments:        Anesthesia Quick Evaluation

## 2022-12-26 NOTE — Transfer of Care (Signed)
Immediate Anesthesia Transfer of Care Note  Patient: MAUDRY WALZ  Procedure(s) Performed: REVERSE SHOULDER ARTHROPLASTY (Right: Shoulder)  Patient Location: PACU  Anesthesia Type:General  Level of Consciousness: awake, alert , oriented, and patient cooperative  Airway & Oxygen Therapy: Patient Spontanous Breathing and Patient connected to face mask oxygen  Post-op Assessment: Report given to RN, Post -op Vital signs reviewed and stable, and Patient moving all extremities  Post vital signs: Reviewed and stable  Last Vitals:  Vitals Value Taken Time  BP 128/72 12/26/22 0931  Temp    Pulse 60 12/26/22 0932  Resp 17 12/26/22 0932  SpO2 96 % 12/26/22 0932  Vitals shown include unfiled device data.  Last Pain:  Vitals:   12/26/22 0637  TempSrc: Oral  PainSc:          Complications: No notable events documented.

## 2022-12-26 NOTE — Anesthesia Procedure Notes (Signed)
Anesthesia Regional Block: Interscalene brachial plexus block   Pre-Anesthetic Checklist: , timeout performed,  Correct Patient, Correct Site, Correct Laterality,  Correct Procedure, Correct Position, site marked,  Risks and benefits discussed,  Surgical consent,  Pre-op evaluation,  At surgeon's request and post-op pain management  Laterality: Right  Prep: chloraprep       Needles:  Injection technique: Single-shot  Needle Type: Echogenic Stimulator Needle     Needle Length: 9cm  Needle Gauge: 21     Additional Needles:   Procedures:,,,, ultrasound used (permanent image in chart),,    Narrative:  Start time: 12/26/2022 7:00 AM End time: 12/26/2022 7:05 AM Injection made incrementally with aspirations every 5 mL.  Performed by: Personally  Anesthesiologist: Shelton Silvas, MD  Additional Notes: Discussed risks and benefits of the nerve block in detail, including but not limited vascular injury, permanent nerve damage and infection.   Patient tolerated the procedure well. Local anesthetic introduced in an incremental fashion under minimal resistance after negative aspirations. No paresthesias were elicited. After completion of the procedure, no acute issues were identified and patient continued to be monitored by RN.

## 2022-12-26 NOTE — Anesthesia Procedure Notes (Signed)
Procedure Name: Intubation Date/Time: 12/26/2022 7:48 AM  Performed by: Elisabeth Cara, CRNAPre-anesthesia Checklist: Patient identified, Emergency Drugs available, Suction available, Patient being monitored and Timeout performed Patient Re-evaluated:Patient Re-evaluated prior to induction Oxygen Delivery Method: Circle system utilized Preoxygenation: Pre-oxygenation with 100% oxygen Induction Type: IV induction Ventilation: Mask ventilation without difficulty Laryngoscope Size: Mac and 4 Grade View: Grade I Tube type: Oral Tube size: 7.5 mm Number of attempts: 1 Airway Equipment and Method: Stylet Placement Confirmation: ETT inserted through vocal cords under direct vision, positive ETCO2 and breath sounds checked- equal and bilateral Secured at: 22 cm Tube secured with: Tape Dental Injury: Teeth and Oropharynx as per pre-operative assessment

## 2022-12-27 ENCOUNTER — Encounter (HOSPITAL_COMMUNITY): Payer: Self-pay | Admitting: Orthopedic Surgery

## 2023-01-08 DIAGNOSIS — Z96611 Presence of right artificial shoulder joint: Secondary | ICD-10-CM | POA: Diagnosis not present

## 2023-01-08 DIAGNOSIS — Z471 Aftercare following joint replacement surgery: Secondary | ICD-10-CM | POA: Diagnosis not present

## 2023-01-17 DIAGNOSIS — H5212 Myopia, left eye: Secondary | ICD-10-CM | POA: Diagnosis not present

## 2023-01-17 DIAGNOSIS — E119 Type 2 diabetes mellitus without complications: Secondary | ICD-10-CM | POA: Diagnosis not present

## 2023-01-17 DIAGNOSIS — H5201 Hypermetropia, right eye: Secondary | ICD-10-CM | POA: Diagnosis not present

## 2023-01-17 DIAGNOSIS — Z961 Presence of intraocular lens: Secondary | ICD-10-CM | POA: Diagnosis not present

## 2023-01-17 LAB — HM DIABETES EYE EXAM

## 2023-01-21 ENCOUNTER — Ambulatory Visit (INDEPENDENT_AMBULATORY_CARE_PROVIDER_SITE_OTHER): Payer: Medicare HMO | Admitting: Family Medicine

## 2023-01-21 ENCOUNTER — Encounter: Payer: Self-pay | Admitting: Family Medicine

## 2023-01-21 VITALS — BP 132/80 | HR 70 | Ht 61.0 in | Wt 216.2 lb

## 2023-01-21 DIAGNOSIS — Z0000 Encounter for general adult medical examination without abnormal findings: Secondary | ICD-10-CM

## 2023-01-21 DIAGNOSIS — E039 Hypothyroidism, unspecified: Secondary | ICD-10-CM

## 2023-01-21 DIAGNOSIS — E538 Deficiency of other specified B group vitamins: Secondary | ICD-10-CM | POA: Diagnosis not present

## 2023-01-21 DIAGNOSIS — E119 Type 2 diabetes mellitus without complications: Secondary | ICD-10-CM | POA: Diagnosis not present

## 2023-01-21 DIAGNOSIS — I2510 Atherosclerotic heart disease of native coronary artery without angina pectoris: Secondary | ICD-10-CM | POA: Diagnosis not present

## 2023-01-21 DIAGNOSIS — I10 Essential (primary) hypertension: Secondary | ICD-10-CM | POA: Diagnosis not present

## 2023-01-21 MED ORDER — HYDROCODONE-ACETAMINOPHEN 5-325 MG PO TABS: 1.0000 | ORAL_TABLET | Freq: Four times a day (QID) | ORAL | 0 refills | Status: DC | PRN

## 2023-01-21 NOTE — Patient Instructions (Addendum)
 Team abstract all labs from November please  Changed back to hydrocodone  with itching on oxycodone - can dispose of medications through [facility]  Recommended follow up: Return in about 4 months (around 05/21/2023) for followup or sooner if needed.Schedule b4 you leave.

## 2023-01-21 NOTE — Progress Notes (Signed)
 Phone [phone]   Subjective:  Patient presents today for their annual physical. Chief complaint-noted.   See problem oriented charting- ROS- full  review of systems was completed and negative except for: shoulder pain  The following were reviewed and entered/updated in epic: Past Medical History:  Diagnosis Date   Anemia    Anxiety    Arthritis    osteoarthritis - shoulder, knees & hips   Asthma    Coronary artery disease    Depression    Diabetes mellitus without complication (HCC)    no meds   Fibromyalgia    Heart murmur    History of hiatal hernia    Hyperparathyroidism (HCC)    Hypertension    Dr. DOROTHA Budge manages BP, pt. reports MD has not found a need for treatment    Hypothyroidism    Polymyalgia rheumatica (HCC)    Sleep difficulties    had sleep study -2009, prior to gastric surgery, told that there was not a need for f/u   Thyroid  disease    Vitamin B 12 deficiency    Patient Active Problem List   Diagnosis Date Noted   Subdural hematoma (HCC) 02/27/2022    Priority: High   Hyperparathyroidism, primary (HCC) 03/14/2021    Priority: High   Osteoporosis 10/29/2020    Priority: High   History of polymyalgia rheumatica 03/16/2018    Priority: High   CAD (coronary artery disease) 11/19/2017    Priority: High   Hypercalcemia 12/29/2014    Priority: High   OA (osteoarthritis) of knee 10/05/2013    Priority: High   Well controlled type 2 diabetes mellitus (HCC) 07/18/2006    Priority: High   Aortic atherosclerosis (HCC) 02/16/2021    Priority: Medium    Mild aortic stenosis 11/16/2020    Priority: Medium    B12 deficiency 05/31/2019    Priority: Medium    Overactive bladder 09/03/2016    Priority: Medium    Morbid obesity (HCC) 06/28/2015    Priority: Medium    History of gastric bypass 02/08/2014    Priority: Medium    Hyperlipidemia associated with type 2 diabetes mellitus (HCC) 01/05/2008    Priority: Medium    Hypothyroidism 09/09/2007     Priority: Medium    Major depression, recurrent, full remission (HCC) 07/18/2006    Priority: Medium    Essential hypertension 07/18/2006    Priority: Medium    Asthma 07/18/2006    Priority: Medium    Primary osteoarthritis of right knee 06/28/2019    Priority: 1.   CHEST WALL PAIN, ANTERIOR 10/09/2007    Priority: 1.   Allergic rhinitis 07/20/2014    Priority: Low   Past Surgical History:  Procedure Laterality Date   ABDOMINAL HYSTERECTOMY     BARIATRIC SURGERY     CARDIAC CATHETERIZATION     [facility]- 30 yrs. ago   CATARACT EXTRACTION, BILATERAL     late 2018   LEFT HEART CATH AND CORONARY ANGIOGRAPHY N/A 04/02/2018   Procedure: LEFT HEART CATH AND CORONARY ANGIOGRAPHY;  Surgeon: Jordan, Peter M, MD;  Location: [facility] INVASIVE CV LAB;  Service: Cardiovascular;  Laterality: N/A;   OOPHORECTOMY     pantallor arthrodesis with rod placement left foot     PARATHYROIDECTOMY Left 03/16/2021   Procedure: LEFT INFERIOR PARATHYROIDECTOMY;  Surgeon: Eletha Boas, MD;  Location: [facility] ORS;  Service: General;  Laterality: Left;   REVERSE SHOULDER ARTHROPLASTY Right 12/26/2022   Procedure: REVERSE SHOULDER ARTHROPLASTY;  Surgeon: Melita Drivers, MD;  Location:  [facility] ORS;  Service: Orthopedics;  Laterality: Right;    TONSILLECTOMY     TOTAL KNEE ARTHROPLASTY Left 11/30/2018   Procedure: TOTAL KNEE ARTHROPLASTY;  Surgeon: Melodi Lerner, MD;  Location: [facility];  Service: Orthopedics;  Laterality: Left;   TOTAL KNEE ARTHROPLASTY Right 06/28/2019   Procedure: TOTAL KNEE ARTHROPLASTY;  Surgeon: Melodi Lerner, MD;  Location: [facility];  Service: Orthopedics;  Laterality: Right;    TOTAL SHOULDER ARTHROPLASTY Left 03/12/2012   Procedure: TOTAL SHOULDER ARTHROPLASTY;  Surgeon: Franky CHRISTELLA Pointer, MD;  Location: [facility] OR;  Service: Orthopedics;  Laterality: Left;    Family History  Problem Relation Age of Onset   Stroke Mother    Liver cancer Father    Cancer Father        liver   Breast cancer Neg Hx      Medications- reviewed and updated Current Outpatient Medications  Medication Sig Dispense Refill   acetaminophen  (TYLENOL ) 500 MG tablet Take 500 mg by mouth every 6 (six) hours as needed for mild pain (pain score 1-3) or moderate pain (pain score 4-6).     albuterol  (VENTOLIN  HFA) 108 (90 Base) MCG/ACT inhaler Inhale 1-2 puffs into the lungs every 4 (four) hours as needed for wheezing or shortness of breath. 18 g 2   amLODipine  (NORVASC ) 10 MG tablet TAKE 1 TABLET EVERY DAY 90 tablet 2   Biotin 5000 MCG CAPS Take 5,000 mcg by mouth daily.     Blood Glucose Monitoring Suppl (TRUE METRIX AIR GLUCOSE METER) DEVI Use to test blood sugars daily. Dx: E11.9 1 each 5   Cholecalciferol (VITAMIN D -3) 125 MCG (5000 UT) TABS Take 5,000 Units by mouth daily.     cyanocobalamin  (,VITAMIN B-12,) 1000 MCG/ML injection INJECT (1,000 MCG TOTAL)  INTO THE MUSCLE EVERY 30 DAYS USE ON A MONTHLY BASES 10 mL 0   desvenlafaxine  (PRISTIQ ) 100 MG 24 hr tablet TAKE 1 TABLET EVERY DAY 90 tablet 3   diclofenac  (VOLTAREN ) 75 MG EC tablet Take 1 tablet (75 mg total) by mouth 2 (two) times daily. 180 tablet 3   Evolocumab  (REPATHA  SURECLICK) 140 MG/ML SOAJ Inject 140 mg into the skin every 14 (fourteen) days. 6 mL 3   FeFum-FePo-FA-B Cmp-C-Zn-Mn-Cu (SE-TAN PLUS ) 162-115.2-1 MG CAPS Take 1 capsule by mouth 2 (two) times daily. 180 capsule 3   fluticasone  (FLONASE ) 50 MCG/ACT nasal spray Place 1 spray into both nostrils daily.     glucose blood test strip Test blood sugar once per day 100 each 3   Levothyroxine  Sodium 150 MCG CAPS Take 150 mcg by mouth daily. Tirosint      loperamide  (IMODIUM ) 2 MG capsule Take 4 mg by mouth daily as needed for diarrhea or loose stools.      methocarbamol  (ROBAXIN ) 500 MG tablet Take 1 tablet (500 mg total) by mouth every 8 (eight) hours as needed for muscle spasms. 30 tablet 1   nitroGLYCERIN  (NITROSTAT ) 0.4 MG SL tablet Place 1 tablet (0.4 mg total) under the tongue every 5 (five)  minutes as needed for chest pain. 50 tablet 3   Omega-3 Fatty Acids (FISH OIL PO) Take 5,000 mg by mouth daily.     ondansetron  (ZOFRAN ) 4 MG tablet Take 1 tablet (4 mg total) by mouth every 8 (eight) hours as needed for nausea or vomiting. 10 tablet 0   Propylene Glycol (SYSTANE COMPLETE OP) Place 1 drop into both eyes in the morning.     Semaglutide , 1 MG/DOSE, 4 MG/3ML SOPN  Inject 1 mg as directed once a week. 3 mL 4   TRUEplus Lancets 30G MISC Check blood sugar once per day 100 each 3   HYDROcodone -acetaminophen  (NORCO/VICODIN) 5-325 MG tablet Take 1-2 tablets by mouth every 6 (six) hours as needed for moderate pain (pain score 4-6). 40 tablet 0   No current facility-administered medications for this visit.    Allergies-reviewed and updated Allergies  Allergen Reactions   Flexeril  Glynn.gleason ] Other (See Comments)    On Pristiq - possible serotonin reaction to flexeril  can not take together   Ace Inhibitors Other (See Comments) and Swelling    States after a surgery she was told to not take this class of drugs- not 100% sure what the reason was Possible angioedema during/after general anesthesia States after a surgery she was told to not take this class of drugs- not 100% sure what the reason was States after a surgery she was told to not take this class of drugs- not 100% sure what the reason was    Codeine Nausea Only and Other (See Comments)    Upset stomach   Hydrochlorothiazide      cramps   Kiwi Extract Other (See Comments)   Metoprolol      Edema, fatigue, headache, low HR into 40s   Azithromycin Rash    Social History   Social History Narrative   Widowed 2013. 2 kids. 4 grandkids. Oldest daughter lives in [place] and has 2 grandkids that are now in Oceanographer, PT school at [employer].       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)   Does some dog sitting at her  home   Live alone one   Objective  Objective:  BP 132/80 (BP Location: Left Arm, Patient Position: Sitting, Cuff Size: Normal)   Pulse 70   Ht 5' 1 (1.549 m)   Wt 216 lb 3.2 oz (98.1 kg)   SpO2 93%   BMI 40.85 kg/m  Gen: NAD, resting comfortably HEENT: Mucous membranes are moist. Oropharynx normal Neck: no thyromegaly CV: RRR no murmurs rubs or gallops Lungs: CTAB no crackles, wheeze, rhonchi Abdomen: soft/nontender/nondistended/normal bowel sounds. No rebound or guarding.  Ext: no edema Skin: warm, dry Neuro: grossly normal, moves all extremities, PERRLA   Assessment and Plan   82 y.o. female presenting for annual physical.  Health Maintenance counseling: 1. Anticipatory guidance: Patient counseled regarding regular dental exams- Dentures and daughter is a education officer, community, eye exams -yearly just had,  avoiding smoking and second hand smoke , limiting alcohol  to 1 beverage per day-very rare social , no illicit drugs .   2. Risk factor reduction:  Advised patient of need for regular exercise and diet rich and fruits and vegetables to reduce risk of heart attack and stroke.  Exercise-still doing some chair exercises before surgery- wants to restart- feels walking better in general. May swim Diet/weight management-weight down 9 pounds in last year- Ozempic  has been helpful and cautious with foods.  Wt Readings from Last 3 Encounters:  01/21/23 216 lb 3.2 oz (98.1 kg)  12/26/22 218 lb (98.9 kg)  12/18/22 218 lb (98.9 kg)  3. Immunizations/screenings/ancillary studies-flu shot- had this- she will send date, already had COVID shot  Immunization History  Administered Date(s) Administered   Fluad Quad(high Dose 65+) 09/22/2018, 10/19/2019, 10/07/2020, 10/02/2021   Influenza Split 10/22/2010, 11/14/2011, 10/21/2013   Influenza Whole 11/13/2006, 10/27/2008   Influenza, High  Dose Seasonal PF 10/28/2014, 09/28/2015, 10/18/2016, 11/19/2017   Influenza,inj,Quad PF,6+ Mos 10/05/2012, 10/05/2013    Influenza-Unspecified 10/21/2013   Moderna Covid-19 Fall Seasonal Vaccine 67yrs & older 10/15/2022   PFIZER(Purple Top)SARS-COV-2 Vaccination 02/11/2019, 03/04/2019, 09/04/2019, 03/11/2020   PNEUMOCOCCAL CONJUGATE-20 10/17/2021   Pfizer Covid-19 Vaccine Bivalent Booster 56yrs & up 11/06/2020, 10/10/2021   Pneumococcal Conjugate-13 03/01/2013, 10/21/2013   Pneumococcal Polysaccharide-23 05/31/2006   RSV,unspecified 10/10/2021   Respiratory Syncytial Virus Vaccine,Recomb Aduvanted(Arexvy) 10/10/2021   Td 11/24/1998, 03/01/2013   Tdap 10/15/2022   Zoster Recombinant(Shingrix ) 10/31/2016, 05/01/2017   Zoster, Live 06/20/2006   4. Cervical cancer screening- past age based screening recommendations.  No history abnormal Pap smear  5. Breast cancer screening-   mammogram -wants to hold off on repeat-past age based screening recommendations  6. Colon cancer screening - last in 2016-GI advised this could be last colonoscopy as long as no new symptoms-she declines new symptoms   7. Skin cancer screening- no dermatologist.  advised regular sunscreen use. Denies worrisome, changing, or new skin lesions.  8. Birth control/STD check- postmenopausal/not dating  22. Osteoporosis screening at 65-has been followed by Dr. Sebastian with endocrinology  10. Smoking associated screening - former smoker but quit over 45 years-ago no regular screening   Status of chronic or acute concerns   #Severe osteoarthritis of knee- opts out of repeat surgery- very sparing hydrocodone  5-325 mg. Uses diclofenac  so we have to watch renal function -even half tablet of oxycodone  causing itching- doesn't itch on hydrocodone  so wants to swich back even for the shoulder issues (even if not as effective at least doesn't make her itch) -she has remaining oxycodone  and can [place] -still uses diclofenac  as well # Diabetes S: Medication:Ozempic  0.5 mg but had some time off around surgery- plans to finish last 2  doses then go to the 1 mg dose A/P: a1c was 6.4 just a month ago- team will abstract  #hypertension S: medication: amlodipine  10 mg BP Readings from Last 3 Encounters:  01/21/23 132/80  12/26/22 (!) 132/99  12/18/22 (!) 131/92  A/P: stable- continue current medicines   #CAD- saw Dr. Claudene then Dr. Alveta #hyperlipidemia S: Medication: Repatha  140 mg q2 weeks, off of aspirin  per cardiology -no chest pain or shortness of breath  Lab Results  Component Value Date   CHOL 155 01/08/2022   HDL 59.90 01/08/2022   LDLCALC 62 01/08/2022   LDLDIRECT 60.0 05/31/2019   TRIG 76 03/01/2022   CHOLHDL 3 01/08/2022  A/P: lipids with LDL at 51 on 11/26/22- continue current medications  Coronary artery disease asymptomatic -continue current medications   #hypothyroidism- [facility] Sebastian- will be new doctor S: compliant On thyroid  medication- levothyroxine  150 mcg A/P:TSH just checked and in range- continue current medications    # B12 deficiency S: Current treatment/medication (oral vs. IM):  1000 mcg injections at home Lab Results  Component Value Date   VITAMINB12 810 07/11/2022  A/P: controlled last check continue current medications    # Depression S: Medication: Pristiq  100 mg    09/20/2022    3:41 PM 07/11/2022    8:43 AM 07/11/2022    8:24 AM  Depression screen PHQ 2/9  Decreased Interest 0  0  Down, Depressed, Hopeless 0 0 1  PHQ - 2 Score 0 0 1  Altered sleeping 0 0   Tired, decreased energy 1 0   Change in appetite 0 0   Feeling bad or failure about yourself  0 0   Trouble concentrating 0 0   Moving slowly or fidgety/restless 0 0   Suicidal thoughts 0 0   PHQ-9 Score 1 0   Difficult doing work/chores Not difficult at all Not difficult at all   A/P: full remission- continue current medications    Recommended follow up: Return in about 4 months (around 05/21/2023) for followup or sooner if needed.Schedule b4 you leave. Future Appointments  Date Time  Provider Department Center  07/18/2023 10:20 AM [facility] [facility] None    Lab/Order associations: no labs today   ICD-10-CM   1. Preventative health care  Z00.00     2. Well controlled type 2 diabetes mellitus (HCC)  E11.9     3. Coronary artery disease involving native coronary artery of native heart without angina pectoris  I25.10     4. B12 deficiency  E53.8     5. Essential hypertension  I10     6. Hypothyroidism, unspecified type  E03.9       Meds ordered this encounter  Medications   HYDROcodone -acetaminophen  (NORCO/VICODIN) 5-325 MG tablet    Sig: Take 1-2 tablets by mouth every 6 (six) hours as needed for moderate pain (pain score 4-6).    Dispense:  40 tablet    Refill:  0    OA of the knees-back pain as well    Return precautions advised.  Garnette Lukes, MD

## 2023-01-24 ENCOUNTER — Encounter: Payer: Self-pay | Admitting: Pharmacist

## 2023-01-24 NOTE — Progress Notes (Signed)
   12/17/2022 Name: [name] MRN: [id number] DOB: 1940-04-02  Chief Complaint  Patient presents with   Medication Management    [name] is a 83 y.o. year old female.Coordinated with medication assistance program representative with [employer].    They were referred to the pharmacist by their PCP for assistance in managing medication access.   Subjective: Patient is currently taking Ozempic  1mg  weekly.  We submitted her 2025 medication assistance program application in December 2024.  Check with automated system to see if patient has been approved for 2025. System reported she was missing information. Spoke with representative who reported page 3 of application needed to have check mark in box and page 5 was missing.   Patient has endorsed that she received 2 boxes of Ozempic  1mg  from [employer] in November.    Patient reports affordability concerns with their medications: Yes  - cost of Ozempic  it too high once in Medicare coverage gap.  Patient reports access/transportation concerns to their pharmacy: No  Patient reports adherence concerns with their medications:  No     Started Ozempic  01/2022  Starting Weight = 225 lbs Starting BMI = 42.51 Staring A1c = 6.7% Last weight = 216 lbs (10/2022) Last BMI= 40.8 Last A1c = 6.1% Total weight loss to date = 9 lbs   Objective:  Lab Results  Component Value Date   HGBA1C 6.1 07/11/2022    Lab Results  Component Value Date   CREATININE 0.94 09/03/2022   BUN 11 09/03/2022   NA 134 (L) 09/03/2022   K 4.1 09/03/2022   CL 95 (L) 09/03/2022   CO2 31 09/03/2022    Lab Results  Component Value Date   CHOL 155 01/08/2022   HDL 59.90 01/08/2022   LDLCALC 62 01/08/2022   LDLDIRECT 60.0 05/31/2019   TRIG 76 03/01/2022   CHOLHDL 3 01/08/2022     Assessment/Plan:   Diabetes / Obesity: Diabetes is currently controlled and weight decreased 9lbs - Continue Ozempic  to 1mg  weekly  - confirmed missing  information. Our application that was scanned in December has informaiton that [employer] reprots was missing. I re-faxed copay of application we have on file.   Follow Up Plan: 2 to 4 weeks to check on 2025 patient assistance program application  Madelin Ray, PharmD Clinical Pharmacist [facility]  Population Health [phone]

## 2023-01-28 DIAGNOSIS — M25511 Pain in right shoulder: Secondary | ICD-10-CM | POA: Diagnosis not present

## 2023-01-29 ENCOUNTER — Telehealth: Payer: Self-pay | Admitting: Pharmacist

## 2023-01-29 NOTE — Telephone Encounter (Signed)
 I have been working to verify with [employer] when patient's Ozempic  1mg  might be delivered. She has been approved to receive Ozempic  thry 01/21/2024.  Spoke to [employer] Representative finally after 20+ minute hold. They are processing her first shipment now and should be delivered in 10 to 14 business days. Expected delivery date 02/11/2023 to 02/13/2023.  Tried to call patient back but had to LM on VM.

## 2023-02-03 ENCOUNTER — Other Ambulatory Visit: Payer: Self-pay | Admitting: Family Medicine

## 2023-02-03 ENCOUNTER — Other Ambulatory Visit (HOSPITAL_COMMUNITY): Payer: Self-pay

## 2023-02-03 MED ORDER — DESVENLAFAXINE SUCCINATE ER 100 MG PO TB24
100.0000 mg | ORAL_TABLET | Freq: Every day | ORAL | 3 refills | Status: DC
  Filled 2023-02-03: qty 90, 90d supply, fill #0
  Filled 2023-04-30 (×2): qty 90, 90d supply, fill #1
  Filled 2023-07-26: qty 90, 90d supply, fill #2
  Filled 2023-11-06: qty 90, 90d supply, fill #3

## 2023-02-03 NOTE — Telephone Encounter (Signed)
 Copied from CRM [id number]. Topic: Clinical - Medication Question >> Feb 03, 2023 12:56 PM Adaysia C wrote: Reason for CRM: Patient requested a call back from the provider or nurse about her desvenlafaxine  (PRISTIQ ) 100 MG 24 hr tablet medication. Please follow up with patient [phone]

## 2023-02-03 NOTE — Telephone Encounter (Signed)
 Copied from CRM [id number]. Topic: Clinical - Medication Refill >> Feb 03, 2023 12:47 PM Adaysia C wrote: Most Recent Primary Care Visit:  Provider: KATRINKA GARNETTE KIDD  Department: [facility]  Visit Type: PHYSICAL  Date: 01/21/2023  Medication: desvenlafaxine  (PRISTIQ ) 100 MG 24 hr tablet   Has the patient contacted their pharmacy? No, patient contacted provider to initiate RX refill (Agent: If no, request that the patient contact the pharmacy for the refill. If patient does not wish to contact the pharmacy document the reason why and proceed with request.) (Agent: If yes, when and what did the pharmacy advise?)  Is this the correct pharmacy for this prescription? Yes If no, delete pharmacy and type the correct one.  This is the patient's preferred pharmacy:   [facility] [address] [place], [place], [place] [address] Phone: [phone]   Has the prescription been filled recently? No  Is the patient out of the medication? No  Has the patient been seen for an appointment in the last year OR does the patient have an upcoming appointment? Yes  Can we respond through MyChart? Yes  Agent: Please be advised that Rx refills may take up to 3 business days. We ask that you follow-up with your pharmacy.

## 2023-02-04 ENCOUNTER — Other Ambulatory Visit: Payer: Self-pay

## 2023-02-04 ENCOUNTER — Encounter: Payer: Self-pay | Admitting: Family Medicine

## 2023-02-04 DIAGNOSIS — M25511 Pain in right shoulder: Secondary | ICD-10-CM | POA: Diagnosis not present

## 2023-02-04 MED ORDER — SE-TAN PLUS 162-115.2-1 MG PO CAPS: 1.0000 | ORAL_CAPSULE | Freq: Two times a day (BID) | ORAL | 3 refills | Status: DC

## 2023-02-06 ENCOUNTER — Other Ambulatory Visit (HOSPITAL_COMMUNITY): Payer: Self-pay

## 2023-02-07 ENCOUNTER — Other Ambulatory Visit (HOSPITAL_COMMUNITY): Payer: Self-pay

## 2023-02-10 ENCOUNTER — Encounter: Payer: Self-pay | Admitting: Family Medicine

## 2023-02-11 ENCOUNTER — Other Ambulatory Visit (HOSPITAL_COMMUNITY): Payer: Self-pay

## 2023-02-11 ENCOUNTER — Other Ambulatory Visit: Payer: Self-pay

## 2023-02-11 DIAGNOSIS — M25511 Pain in right shoulder: Secondary | ICD-10-CM | POA: Diagnosis not present

## 2023-02-11 MED ORDER — AMLODIPINE BESYLATE 10 MG PO TABS
10.0000 mg | ORAL_TABLET | Freq: Every day | ORAL | 2 refills | Status: DC
  Filled 2023-02-11: qty 90, 90d supply, fill #0
  Filled 2023-04-30 (×2): qty 90, 90d supply, fill #1
  Filled 2023-07-26: qty 90, 90d supply, fill #2

## 2023-02-14 ENCOUNTER — Other Ambulatory Visit (HOSPITAL_COMMUNITY): Payer: Self-pay

## 2023-02-14 ENCOUNTER — Telehealth: Payer: Self-pay

## 2023-02-14 NOTE — Telephone Encounter (Signed)
Received 4 boxes of Ozempic from Pt Assistance for this pt. Called pt and made pt aware ok to stop by office to pick up. Samples have been placed in the small fridge in Dr Hunter's pod.

## 2023-02-14 NOTE — Telephone Encounter (Signed)
Pt picked up Ozempic medication. 4 boxes.

## 2023-02-18 DIAGNOSIS — M25511 Pain in right shoulder: Secondary | ICD-10-CM | POA: Diagnosis not present

## 2023-02-25 DIAGNOSIS — M25511 Pain in right shoulder: Secondary | ICD-10-CM | POA: Diagnosis not present

## 2023-02-27 ENCOUNTER — Encounter: Payer: Self-pay | Admitting: Pharmacist

## 2023-02-27 ENCOUNTER — Telehealth: Payer: Self-pay | Admitting: Pharmacist

## 2023-02-27 NOTE — Telephone Encounter (Signed)
 Called to make sure patient did not have any questions regarding Ozempic . She received first patient assistance program delivery of 4 pens of Ozempic  1mg  on 02/14/2023.  Made follow up for end of 2025 to start process of applying for 2026 if needed.

## 2023-02-27 NOTE — Progress Notes (Signed)
   12/17/2022 Name: [name] MRN: [id number] DOB: 03-Apr-1940  Chief Complaint  Patient presents with   Medication Management   Diabetes    [name] is a 83 y.o. year old female. She called in to discuss Ozempic .    They were referred to the pharmacist by their PCP for assistance in managing medication access.   Subjective: Patient is currently taking Ozempic  1mg  weekly. Today will be her 3rd dose. She had held Ozmepic for about 3 week in December surrounding surgery on her shoulder.  Today she reports that she is not sure that Ozmepic 1mg  is working as well as when she first started Ozempic  at 0.25mg  dose. She does feel like she is eating less but not sure that she is losing weight.  She has not checked weight at home recently.  She continues to do rehab for her shoulder and has not gotten back to her usually activity level yet.    She has been approved to receive Ozmepic 1mg  thru [employer] medication assistance program. Her first delivery was received 02/14/2023. (#4 pens received)   Patient reports affordability concerns with their medications: Yes  - cost of Ozempic  it too high Patient reports access/transportation concerns to their pharmacy: No  Patient reports adherence concerns with their medications:  No     Started Ozempic  01/2022  Starting Weight = 225 lbs Starting BMI = 42.51 Staring A1c = 6.7% Last weight = 216 lbs (10/2022) - this was when she was on 0.5mg  dose of Ozempic )  Last BMI= 40.8 Last A1c = 6.4% Total weight loss to date = 9 lbs   Objective:  Lab Results  Component Value Date   HGBA1C 6.4 11/26/2022    Lab Results  Component Value Date   CREATININE 0.94 09/03/2022   BUN 11 09/03/2022   NA 134 (L) 09/03/2022   K 4.1 09/03/2022   CL 95 (L) 09/03/2022   CO2 31 09/03/2022    Lab Results  Component Value Date   CHOL 123 11/26/2022   HDL 51 11/26/2022   LDLCALC 51 11/26/2022   LDLDIRECT 60.0 05/31/2019   TRIG 125 11/26/2022    CHOLHDL 3 01/08/2022     Assessment/Plan:   Diabetes / Obesity: Diabetes is currently controlled and weight decreased 9lbs - Continue Ozempic  to 1mg  weekly  - Discussed increasing physical activity once she is cleared to do so with PT / ortho regarding her shoulder.   Follow Up Plan: 4 weeks to check on appetite / weight. Consider increasing Ozempic  to 2mg  weekly.   Madelin Ray, PharmD Clinical Pharmacist [facility] Primary Care  Population Health [phone]

## 2023-03-11 DIAGNOSIS — H6123 Impacted cerumen, bilateral: Secondary | ICD-10-CM | POA: Diagnosis not present

## 2023-03-19 DIAGNOSIS — M25511 Pain in right shoulder: Secondary | ICD-10-CM | POA: Diagnosis not present

## 2023-03-24 DIAGNOSIS — Z96611 Presence of right artificial shoulder joint: Secondary | ICD-10-CM | POA: Diagnosis not present

## 2023-03-24 DIAGNOSIS — Z471 Aftercare following joint replacement surgery: Secondary | ICD-10-CM | POA: Diagnosis not present

## 2023-03-27 ENCOUNTER — Other Ambulatory Visit: Payer: Self-pay | Admitting: Pharmacist

## 2023-03-27 NOTE — Progress Notes (Signed)
   12/17/2022 Name: [name] MRN: [id number] DOB: Aug 23, 1940  No chief complaint on file.   [name] is a 83 y.o. year old female. She called in to discuss Ozempic.    They were referred to the pharmacist by their PCP for assistance in managing medication access.   Subjective: Patient is currently taking Ozempic 1mg  weekly. She has received delivery from [employer] medication assistance program 02/14/2023 - #4 pens. Today she reports that Ozmepic 1mg  is working better since she has been regularly for the last few weeks (has stopped prior to surgery)    Patient reports affordability concerns with their medications: Yes  - cost of Ozempic it too high but now approved for medication assistance program.  Patient reports access/transportation concerns to their pharmacy: No  Patient reports adherence concerns with their medications:  No     Started Ozempic 01/2022  Starting Weight = 225 lbs Starting BMI = 42.51 Staring A1c = 6.7% Last weight = 210 lbs (home weight) Last BMI= 40.8 Last A1c = 6.4% Total weight loss to date = 15 lbs  Wt Readings from Last 3 Encounters:  01/21/23 216 lb 3.2 oz (98.1 kg)  12/26/22 218 lb (98.9 kg)  12/18/22 218 lb (98.9 kg)   Diet:  Has not been following usual diet due to her birthday last week. States she over indulged a few times. She has changed back to limiting portion sizes and limiting intake of high carbohydrate foods this week.   Exercise:  Saw orthopedist / surgeon Monday and they released her. She is starting Financial controller / video series.  Objective:  Lab Results  Component Value Date   HGBA1C 6.4 11/26/2022    Lab Results  Component Value Date   CREATININE 0.94 09/03/2022   BUN 11 09/03/2022   NA 134 (L) 09/03/2022   K 4.1 09/03/2022   CL 95 (L) 09/03/2022   CO2 31 09/03/2022    Lab Results  Component Value Date   CHOL 123 11/26/2022   HDL 51 11/26/2022   LDLCALC 51 11/26/2022   LDLDIRECT 60.0  05/31/2019   TRIG 125 11/26/2022   CHOLHDL 3 01/08/2022     Assessment/Plan:   Diabetes / Obesity: Diabetes is currently controlled and weight decreased about 15 lbs since starting Ozempic - Continue Ozempic to 1mg  weekly  - Discussed increasing physical activity - plans to start Sit and Fit videos 3 times per week next week.  - Continue to limit portion sizes and intake of high CHO foods.  - Call Clinical Pharmacist if any issues with delivery of ozempcic  Follow Up Plan: October 2025.  Henrene Pastor, PharmD Clinical Pharmacist [facility]  Population Health [phone]

## 2023-04-01 ENCOUNTER — Encounter: Payer: Self-pay | Admitting: Family Medicine

## 2023-04-01 NOTE — Telephone Encounter (Signed)
 FYI

## 2023-04-08 ENCOUNTER — Telehealth: Payer: Self-pay | Admitting: Family Medicine

## 2023-04-08 NOTE — Telephone Encounter (Signed)
 [facility] nordisk faxed  Patient Assistance Application, to be filled out by provider. Patient requested to send it back via Fax within 5-days. Document is located in providers tray at front office.Please advise at  [phone].

## 2023-04-09 NOTE — Telephone Encounter (Signed)
 This form was just notifying us of pt next shipment for April. Nothing further needed at this time.

## 2023-04-30 ENCOUNTER — Other Ambulatory Visit: Payer: Self-pay | Admitting: Family Medicine

## 2023-04-30 ENCOUNTER — Telehealth: Payer: Self-pay | Admitting: Pharmacist

## 2023-04-30 ENCOUNTER — Other Ambulatory Visit: Payer: Self-pay

## 2023-04-30 ENCOUNTER — Other Ambulatory Visit (HOSPITAL_COMMUNITY): Payer: Self-pay

## 2023-04-30 MED ORDER — DICLOFENAC SODIUM 75 MG PO TBEC
75.0000 mg | DELAYED_RELEASE_TABLET | Freq: Two times a day (BID) | ORAL | 3 refills | Status: DC
  Filled 2023-04-30: qty 180, 90d supply, fill #0
  Filled 2023-07-26: qty 180, 90d supply, fill #1
  Filled 2023-11-06: qty 180, 90d supply, fill #2

## 2023-04-30 NOTE — Telephone Encounter (Signed)
 Patient left a message on VM of clinical pharmacist 04/29/2023 that she has a question about Ozempic.  Called her back today.  [name] states that she doesn't feel that Ozempic 1mg  weekly is working as well as it has in the past. She states she "feels hungry all the time". She would like to consider increasing dose to 2mg  week.   Patient reports she has 1 pen of Ozempic 1mg  left.   Last weight was 205 lbs on 04/24/2023 - this was a home weight.  Does not check blood glucose at home  Wt Readings from Last 3 Encounters:  01/21/23 216 lb 3.2 oz (98.1 kg)  12/26/22 218 lb (98.9 kg)  12/18/22 218 lb (98.9 kg)   Spoke with [employer] to confirm that her next shipment for Ozempic has not been sent.  Will check with PCP about increasing Ozempic to 2mg  weekly dose. If approved, will complete dose change fro [employer] patient assistance program and fax.    Henrene Pastor, PharmD Clinical Pharmacist [facility] Primary [facility]  Population Health [phone]

## 2023-04-30 NOTE — Telephone Encounter (Signed)
 Yes thanks- I'm ok with increase to 2 mg. Thanks!

## 2023-05-01 ENCOUNTER — Telehealth: Payer: Self-pay | Admitting: Family Medicine

## 2023-05-01 ENCOUNTER — Encounter: Payer: Self-pay | Admitting: Family Medicine

## 2023-05-01 NOTE — Addendum Note (Signed)
 Addended by: Henrene Pastor B on: 05/01/2023 08:31 AM   Modules accepted: Orders

## 2023-05-01 NOTE — Telephone Encounter (Signed)
 Thanks for the update

## 2023-05-01 NOTE — Telephone Encounter (Signed)
 Copied from CRM [id number]. Topic: General - Other >> May 01, 2023  8:38 AM Sonny Dandy B wrote: Reason for CRM: Pt called to speak with tammy Eckard please give pt a call back at [phone]

## 2023-05-01 NOTE — Telephone Encounter (Signed)
 Updated patient's med list with change from Ozmepic 1mg  to 2mg  weekly.  Completed [facility] Nord[employer] Program change request. Will fax to Dr Durene Cal when back in the office tomorrow.

## 2023-05-01 NOTE — Telephone Encounter (Signed)
 Patient called to tell me that she occasionally checks her blood glucose.  Yesterday 1 hour after she ate lunch she checked blood glucose her blood glucose was 130.  This morning when she checked blood glucose it was 111.   Wt = 204lbs (she has lost another 1 lbs)   She has decided that she would like to continue Ozempic 1mg  weekly instead of increasing to 2mg .  Updated med list and will not send in dose change to [facility].

## 2023-05-22 ENCOUNTER — Encounter: Payer: Self-pay | Admitting: Family Medicine

## 2023-05-22 ENCOUNTER — Ambulatory Visit: Payer: Medicare HMO | Admitting: Family Medicine

## 2023-05-22 VITALS — BP 138/80 | HR 68 | Temp 97.0°F | Ht 61.0 in | Wt 205.0 lb

## 2023-05-22 DIAGNOSIS — I2510 Atherosclerotic heart disease of native coronary artery without angina pectoris: Secondary | ICD-10-CM

## 2023-05-22 DIAGNOSIS — E039 Hypothyroidism, unspecified: Secondary | ICD-10-CM

## 2023-05-22 DIAGNOSIS — I10 Essential (primary) hypertension: Secondary | ICD-10-CM | POA: Diagnosis not present

## 2023-05-22 DIAGNOSIS — Z7985 Long-term (current) use of injectable non-insulin antidiabetic drugs: Secondary | ICD-10-CM | POA: Diagnosis not present

## 2023-05-22 DIAGNOSIS — E785 Hyperlipidemia, unspecified: Secondary | ICD-10-CM

## 2023-05-22 DIAGNOSIS — E119 Type 2 diabetes mellitus without complications: Secondary | ICD-10-CM

## 2023-05-22 DIAGNOSIS — E1169 Type 2 diabetes mellitus with other specified complication: Secondary | ICD-10-CM

## 2023-05-22 DIAGNOSIS — E538 Deficiency of other specified B group vitamins: Secondary | ICD-10-CM | POA: Diagnosis not present

## 2023-05-22 LAB — COMPREHENSIVE METABOLIC PANEL WITH GFR
ALT: 21 U/L (ref 0–35)
AST: 25 U/L (ref 0–37)
Albumin: 4.1 g/dL (ref 3.5–5.2)
Alkaline Phosphatase: 99 U/L (ref 39–117)
BUN: 12 mg/dL (ref 6–23)
CO2: 30 meq/L (ref 19–32)
Calcium: 9.4 mg/dL (ref 8.4–10.5)
Chloride: 100 meq/L (ref 96–112)
Creatinine, Ser: 0.92 mg/dL (ref 0.40–1.20)
GFR: 57.71 mL/min — ABNORMAL LOW (ref >60.00)
Glucose, Bld: 95 mg/dL (ref 70–99)
Potassium: 4.4 meq/L (ref 3.5–5.1)
Sodium: 137 meq/L (ref 135–145)
Total Bilirubin: 0.6 mg/dL (ref 0.2–1.2)
Total Protein: 7.1 g/dL (ref 6.0–8.3)

## 2023-05-22 LAB — CBC WITH DIFFERENTIAL/PLATELET
Basophils Absolute: 0.1 10*3/uL (ref 0.0–0.1)
Basophils Relative: 1 % (ref 0.0–3.0)
Eosinophils Absolute: 0.3 10*3/uL (ref 0.0–0.7)
Eosinophils Relative: 5.6 % — ABNORMAL HIGH (ref 0.0–5.0)
HCT: 42.4 % (ref 36.0–46.0)
Hemoglobin: 14.1 g/dL (ref 12.0–15.0)
Lymphocytes Relative: 26.2 % (ref 12.0–46.0)
Lymphs Abs: 1.6 10*3/uL (ref 0.7–4.0)
MCHC: 33.1 g/dL (ref 30.0–36.0)
MCV: 90.4 fl (ref 78.0–100.0)
Monocytes Absolute: 0.6 10*3/uL (ref 0.1–1.0)
Monocytes Relative: 9.7 % (ref 3.0–12.0)
Neutro Abs: 3.5 10*3/uL (ref 1.4–7.7)
Neutrophils Relative %: 57.5 % (ref 43.0–77.0)
Platelets: 206 10*3/uL (ref 150.0–400.0)
RBC: 4.69 Mil/uL (ref 3.87–5.11)
RDW: 14.5 % (ref 11.5–15.5)
WBC: 6.1 10*3/uL (ref 4.0–10.5)

## 2023-05-22 LAB — HEMOGLOBIN A1C: Hgb A1c MFr Bld: 6.2 % (ref 4.6–6.5)

## 2023-05-22 LAB — VITAMIN B12: Vitamin B-12: 586 pg/mL (ref 211–911)

## 2023-05-22 NOTE — Patient Instructions (Addendum)
-  if we increase Ozempic  dose likely due to stagnating weight loss but congratulated on 11 lbs down in last 4-5 months and she will reach out if feels reaches plateau for about a month- I like the nice gradual trend downward she has had  You set a goal of going to [facility] once a week between now and next visit. I am rooting for you!   Please stop by lab before you go If you have mychart- we will send your results within 3 business days of us  receiving them.  If you do not have mychart- we will call you about results within 5 business days of us  receiving them.  *please also note that you will see labs on mychart as soon as they post. I will later go in and write notes on them- will say "notes from Dr. Arlene Ben"   Recommended follow up: Return in about 4 months (around 09/22/2023) for followup or sooner if needed.Schedule b4 you leave.

## 2023-05-22 NOTE — Progress Notes (Addendum)
 Phone [phone] In person visit   Subjective:   [name] is a 83 y.o. year old very pleasant female patient who presents for/with See problem oriented charting Chief Complaint  Patient presents with   Medical Management of Chronic Issues   Diabetes    Wants to discuss going up on Ozempic    Hypertension   Past Medical History-  Patient Active Problem List   Diagnosis Date Noted   Subdural hematoma (HCC) 02/27/2022    Priority: High   Hyperparathyroidism, primary (HCC) 03/14/2021    Priority: High   Osteoporosis 10/29/2020    Priority: High   History of polymyalgia rheumatica 03/16/2018    Priority: High   CAD (coronary artery disease) 11/19/2017    Priority: High   Hypercalcemia 12/29/2014    Priority: High   OA (osteoarthritis) of knee 10/05/2013    Priority: High   Well controlled type 2 diabetes mellitus (HCC) 07/18/2006    Priority: High   Aortic atherosclerosis (HCC) 02/16/2021    Priority: Medium    Mild aortic stenosis 11/16/2020    Priority: Medium    B12 deficiency 05/31/2019    Priority: Medium    Overactive bladder 09/03/2016    Priority: Medium    Morbid obesity (HCC) 06/28/2015    Priority: Medium    History of gastric bypass 02/08/2014    Priority: Medium    Hyperlipidemia associated with type 2 diabetes mellitus (HCC) 01/05/2008    Priority: Medium    Hypothyroidism 09/09/2007    Priority: Medium    Major depression, recurrent, full remission (HCC) 07/18/2006    Priority: Medium    Essential hypertension 07/18/2006    Priority: Medium    Asthma 07/18/2006    Priority: Medium    Primary osteoarthritis of right knee 06/28/2019    Priority: 1.   CHEST WALL PAIN, ANTERIOR 10/09/2007    Priority: 1.   Allergic rhinitis 07/20/2014    Priority: Low    Medications- reviewed and updated Current Outpatient Medications  Medication Sig Dispense Refill   acetaminophen  (TYLENOL ) 500 MG tablet Take 500 mg by mouth every 6 (six) hours as  needed for mild pain (pain score 1-3) or moderate pain (pain score 4-6).     albuterol  (VENTOLIN  HFA) 108 (90 Base) MCG/ACT inhaler Inhale 1-2 puffs into the lungs every 4 (four) hours as needed for wheezing or shortness of breath. 18 g 2   amLODipine  (NORVASC ) 10 MG tablet Take 1 tablet (10 mg total) by mouth daily. 90 tablet 2   Biotin 5000 MCG CAPS Take 5,000 mcg by mouth daily.     Blood Glucose Monitoring Suppl (TRUE METRIX AIR GLUCOSE METER) DEVI Use to test blood sugars daily. Dx: E11.9 1 each 5   Cholecalciferol (VITAMIN D -3) 125 MCG (5000 UT) TABS Take 5,000 Units by mouth daily.     cyanocobalamin  (,VITAMIN B-12,) 1000 MCG/ML injection INJECT (1,000 MCG TOTAL)  INTO THE MUSCLE EVERY 30 DAYS USE ON A MONTHLY BASES 10 mL 0   desvenlafaxine  (PRISTIQ ) 100 MG 24 hr tablet Take 1 tablet (100 mg total) by mouth daily. 90 tablet 3   diclofenac  (VOLTAREN ) 75 MG EC tablet Take 1 tablet (75 mg total) by mouth 2 (two) times daily. 180 tablet 3   Evolocumab  (REPATHA  SURECLICK) 140 MG/ML SOAJ Inject 140 mg into the skin every 14 (fourteen) days. 6 mL 3   FeFum-FePo-FA-B Cmp-C-Zn-Mn-Cu (SE-TAN PLUS ) 162-115.2-1 MG CAPS Take 1 capsule by mouth 2 (two) times daily. 180 capsule  3   fluticasone  (FLONASE ) 50 MCG/ACT nasal spray Place 1 spray into both nostrils daily.     glucose blood test strip Test blood sugar once per day 100 each 3   Levothyroxine  Sodium 150 MCG CAPS Take 150 mcg by mouth daily. Tirosint      loperamide  (IMODIUM ) 2 MG capsule Take 4 mg by mouth daily as needed for diarrhea or loose stools.      nitroGLYCERIN  (NITROSTAT ) 0.4 MG SL tablet Place 1 tablet (0.4 mg total) under the tongue every 5 (five) minutes as needed for chest pain. 50 tablet 3   Omega-3 Fatty Acids (FISH OIL PO) Take 5,000 mg by mouth daily.     Propylene Glycol (SYSTANE COMPLETE OP) Place 1 drop into both eyes in the morning.     Semaglutide , 1 MG/DOSE, 4 MG/3ML SOPN Inject 1 mg into the skin once a week.      TRUEplus Lancets 30G MISC Check blood sugar once per day 100 each 3   No current facility-administered medications for this visit.     Objective:  BP 138/80   Pulse 68   Temp (!) 97 F (36.1 C)   Ht 5\' 1"  (1.549 m)   Wt 205 lb (93 kg)   SpO2 98%   BMI 38.73 kg/m  Gen: NAD, resting comfortably CV: RRR stable murmur from aortic stenosis on echo 2022 Lungs: CTAB no crackles, wheeze, rhonchi Ext: minimal edema Skin: warm, dry Neuro: walks with cane    Assessment and Plan   #social update- dog  (7 year companion) got hit by a truck and in hospital nearly 2 weeks. Spinal cord injury- has been hard on her.   #Severe osteoarthritis of knee- opts out of  repeat surgery- very sparing hydrocodone  5-325 mg. Does help knee still and diclofenac  helps as well -Uses diclofenac  so we have to watch renal function and she is aware of cardiac risks- once a day - dealing with some back pain as well- may be interested in seeing pain management or PM&R- pain medicine has not been helpful lately so not taking in 2025   # Diabetes S: Medication: Ozempic  0.5 mg--> 1 mg. Had considered going up to 2 mg and opted out.   - weight down 11 lbs since December Wt Readings from Last 3 Encounters:  05/22/23 205 lb (93 kg)  01/21/23 216 lb 3.2 oz (98.1 kg)  12/26/22 218 lb (98.9 kg)   Lab Results  Component Value Date   HGBA1C 6.4 11/26/2022   HGBA1C 6.1 07/11/2022   HGBA1C 6.7 (H) 01/08/2022  A/P: hopefully stable- update a1c today. Continue current meds for now  -if we increase Ozempic  dose likely due to stagnating weight loss but congratulated on 11 lbs down in last 4-5 months and she will reach out if feels reaches plateau for about a month- I like the nice gradual trend downward she has had - joined [facility] but hasn't gone yet- is considering going to classes  #hypertension S: medication: amlodipine  10 mg. Stress with dog not doing well BP Readings from Last 3 Encounters:  05/22/23 138/80  01/21/23  132/80  12/26/22 (!) 132/99  A/P: stable- continue current medicines   # CAD-previously with Dr. Ollie Bhat- now Dr. Alroy Aspen #hyperlipidemia with aortic atherosclerosis S: Medication: Repatha  140 mg q2 weeks LDL looked excellent with [facility] on 11/26/2022 at 51 -no chest pain or shortness of breath  Lab Results  Component Value Date   CHOL 123 11/26/2022   HDL 51  11/26/2022   LDLCALC 51 11/26/2022   LDLDIRECT 60.0 05/31/2019   TRIG 125 11/26/2022   CHOLHDL 3 01/08/2022  A/P: coronary artery disease asymptomatic continue current medications  Lipids at goal- continue current medications   #hypothyroidism- monitored by [facility] S: compliant On thyroid  medication- levothyroxine  150 mcg A/P:thyroid  levels normal in November and has follow up next week  -may want to change to [place] in future- she will let me know  # B12 deficiency S: Current treatment/medication (oral vs. IM):  1000 mcg injections at home Lab Results  Component Value Date   VITAMINB12 810 07/11/2022  A/P: no recent injections- check levels today - will need new prescription at [place] if needed  # Depression S: Medication: Pristiq  100 mg    05/22/2023    8:57 AM 09/20/2022    3:41 PM 07/11/2022    8:43 AM  Depression screen PHQ 2/9  Decreased Interest 0 0   Down, Depressed, Hopeless 0 0 0  PHQ - 2 Score 0 0 0  Altered sleeping 0 0 0  Tired, decreased energy 0 1 0  Change in appetite 0 0 0  Feeling bad or failure about yourself  0 0 0  Trouble concentrating 0 0 0  Moving slowly or fidgety/restless 0 0 0  Suicidal thoughts 0 0 0  PHQ-9 Score 0 1 0  Difficult doing work/chores Not difficult at all Not difficult at all Not difficult at all  A/P: full remission even with recent issues with her dog- continue current medications  Recommended follow up: Return in about 4 months (around 09/22/2023) for followup or sooner if needed.Schedule b4 you leave. Future Appointments  Date Time Provider Department Center   07/18/2023 10:20 AM [facility] [facility] None  11/18/2023 10:00 AM Eckard, Tammy, RPH-[facility] None   Lab/Order associations:   ICD-10-CM   1. Well controlled type 2 diabetes mellitus (HCC)  E11.9     2. Essential hypertension  I10     3. Hyperlipidemia associated with type 2 diabetes mellitus (HCC)  E11.69    E78.5     4. Coronary artery disease involving native coronary artery of native heart without angina pectoris  I25.10     5. Hypothyroidism, unspecified type  E03.9     6. B12 deficiency  E53.8      No orders of the defined types were placed in this encounter.  Return precautions advised.  Clarisa Crooked, MD

## 2023-05-26 DIAGNOSIS — E063 Autoimmune thyroiditis: Secondary | ICD-10-CM | POA: Diagnosis not present

## 2023-06-02 DIAGNOSIS — E063 Autoimmune thyroiditis: Secondary | ICD-10-CM | POA: Diagnosis not present

## 2023-06-02 DIAGNOSIS — M818 Other osteoporosis without current pathological fracture: Secondary | ICD-10-CM | POA: Diagnosis not present

## 2023-06-02 DIAGNOSIS — E559 Vitamin D deficiency, unspecified: Secondary | ICD-10-CM | POA: Diagnosis not present

## 2023-07-18 ENCOUNTER — Ambulatory Visit (INDEPENDENT_AMBULATORY_CARE_PROVIDER_SITE_OTHER): Payer: Medicare HMO | Admitting: Otolaryngology

## 2023-07-24 ENCOUNTER — Ambulatory Visit

## 2023-07-24 VITALS — Ht 61.0 in | Wt 205.0 lb

## 2023-07-24 DIAGNOSIS — Z0000 Encounter for general adult medical examination without abnormal findings: Secondary | ICD-10-CM | POA: Diagnosis not present

## 2023-07-24 DIAGNOSIS — E119 Type 2 diabetes mellitus without complications: Secondary | ICD-10-CM | POA: Diagnosis not present

## 2023-07-24 NOTE — Patient Instructions (Signed)
 [name] , Thank you for taking time out of your busy schedule to complete your Annual Wellness Visit with me. I enjoyed our conversation and look forward to speaking with you again next year. I, as well as your care team,  appreciate your ongoing commitment to your health goals. Please review the following plan we discussed and let me know if I can assist you in the future. Your Game plan/ To Do List   Follow up Visits: Next Medicare AWV with our clinical staff: 07/28/2024.   Have you seen your provider in the last 6 months (3 months if uncontrolled diabetes)? Yes Next Office Visit with your provider: 09/25/2023.  Clinician Recommendations:  Aim for 30 minutes of exercise or brisk walking, 6-8 glasses of water , and 5 servings of fruits and vegetables each day. You are due for a FOOT exam and a kidney evaluation via urine and can get these done during you next office visit.       This is a list of the screening recommended for you and due dates:  Health Maintenance  Topic Date Due   Yearly kidney health urinalysis for diabetes  Never done   COVID-19 Vaccine (8 - Pfizer risk 2024-25 season) 04/14/2023   Medicare Annual Wellness Visit  07/11/2023   Complete foot exam   07/11/2023   Flu Shot  08/22/2023   Hemoglobin A1C  11/22/2023   Eye exam for diabetics  01/17/2024   Yearly kidney function blood test for diabetes  05/21/2024   DTaP/Tdap/Td vaccine (4 - Td or Tdap) 10/14/2032   Pneumococcal Vaccine for age over 14  Completed   DEXA scan (bone density measurement)  Completed   Zoster (Shingles) Vaccine  Completed   Hepatitis B Vaccine  Aged Out   HPV Vaccine  Aged Out   Meningitis B Vaccine  Aged Out    Advanced directives: (Copy Requested) Please bring a copy of your health care power of attorney and living will to the office to be added to your chart at your convenience. You can mail to [facility] [address] [place], [place] [address] or email to  [email or web address] Advance Care Planning is important because it:  [x]  Makes sure you receive the medical care that is consistent with your values, goals, and preferences  [x]  It provides guidance to your family and loved ones and reduces their decisional burden about whether or not they are making the right decisions based on your wishes.  Follow the link provided in your after visit summary or read over the paperwork we have mailed to you to help you started getting your Advance Directives in place. If you need assistance in completing these, please reach out to us  so that we can help you!  See attachments for Preventive Care and Fall Prevention Tips.

## 2023-07-24 NOTE — Progress Notes (Signed)
 Subjective:   [name] is a 83 y.o. who presents for a Medicare Wellness preventive visit.  As a reminder, Annual Wellness Visits don't include a physical exam, and some assessments may be limited, especially if this visit is performed virtually. We may recommend an in-person follow-up visit with your provider if needed.  Visit Complete: Virtual I connected with  Dorna CHRISTELLA Notch on 07/24/23 by a audio enabled telemedicine application and verified that I am speaking with the correct person using two identifiers.  Patient Location: Home  Provider Location: Home Office  I discussed the limitations of evaluation and management by telemedicine. The patient expressed understanding and agreed to proceed.  Vital Signs: Because this visit was a virtual/telehealth visit, some criteria may be missing or patient reported. Any vitals not documented were not able to be obtained and vitals that have been documented are patient reported.  VideoDeclined- This patient declined Librarian, academic. Therefore the visit was completed with audio only.  Persons Participating in Visit: Patient.  AWV Questionnaire: No: Patient Medicare AWV questionnaire was not completed prior to this visit.  Cardiac Risk Factors include: advanced age (>62men, >50 women);diabetes mellitus;dyslipidemia;hypertension;obesity (BMI >30kg/m2);Other (see comment)     Objective:    Today's Vitals   07/24/23 1136  Weight: 205 lb (93 kg)  Height: 5' 1 (1.549 m)   Body mass index is 38.73 kg/m.     07/24/2023   12:03 PM 12/18/2022   10:09 AM 09/11/2022   10:30 AM 07/11/2022    8:25 AM 06/05/2022    7:41 PM 03/13/2022    9:18 AM 02/27/2022   10:22 AM  Advanced Directives  Does Patient Have a Medical Advance Directive? Yes Yes Yes Yes No Yes No  Type of Estate agent of [place];Living will Healthcare Power of [place];Living will Healthcare Power of [place];Living  will Healthcare Power of [place];Living will  Healthcare Power of [place];Living will   Does patient want to make changes to medical advance directive?  No - Patient declined       Copy of Healthcare Power of Attorney in Chart? No - copy requested   No - copy requested     Would patient like information on creating a medical advance directive?     No - Patient declined  No - Patient declined    Current Medications (verified) Outpatient Encounter Medications as of 07/24/2023  Medication Sig   acetaminophen  (TYLENOL ) 500 MG tablet Take 500 mg by mouth every 6 (six) hours as needed for mild pain (pain score 1-3) or moderate pain (pain score 4-6).   albuterol  (VENTOLIN  HFA) 108 (90 Base) MCG/ACT inhaler Inhale 1-2 puffs into the lungs every 4 (four) hours as needed for wheezing or shortness of breath.   amLODipine  (NORVASC ) 10 MG tablet Take 1 tablet (10 mg total) by mouth daily.   Biotin 5000 MCG CAPS Take 5,000 mcg by mouth daily.   Blood Glucose Monitoring Suppl (TRUE METRIX AIR GLUCOSE METER) DEVI Use to test blood sugars daily. Dx: E11.9   Cholecalciferol (VITAMIN D -3) 125 MCG (5000 UT) TABS Take 5,000 Units by mouth daily.   cyanocobalamin  (,VITAMIN B-12,) 1000 MCG/ML injection INJECT (1,000 MCG TOTAL)  INTO THE MUSCLE EVERY 30 DAYS USE ON A MONTHLY BASES   desvenlafaxine  (PRISTIQ ) 100 MG 24 hr tablet Take 1 tablet (100 mg total) by mouth daily.   diclofenac  (VOLTAREN ) 75 MG EC tablet Take 1 tablet (75 mg total) by mouth 2 (two) times  daily.   Evolocumab  (REPATHA  SURECLICK) 140 MG/ML SOAJ Inject 140 mg into the skin every 14 (fourteen) days.   FeFum-FePo-FA-B Cmp-C-Zn-Mn-Cu (SE-TAN PLUS ) 162-115.2-1 MG CAPS Take 1 capsule by mouth 2 (two) times daily.   fluticasone  (FLONASE ) 50 MCG/ACT nasal spray Place 1 spray into both nostrils daily.   glucose blood test strip Test blood sugar once per day   Levothyroxine  Sodium 150 MCG CAPS Take 150 mcg by mouth daily. Tirosint    loperamide  (IMODIUM )  2 MG capsule Take 4 mg by mouth daily as needed for diarrhea or loose stools.    nitroGLYCERIN  (NITROSTAT ) 0.4 MG SL tablet Place 1 tablet (0.4 mg total) under the tongue every 5 (five) minutes as needed for chest pain.   Omega-3 Fatty Acids (FISH OIL PO) Take 5,000 mg by mouth daily.   Propylene Glycol (SYSTANE COMPLETE OP) Place 1 drop into both eyes in the morning.   Semaglutide , 1 MG/DOSE, 4 MG/3ML SOPN Inject 1 mg into the skin once a week.   TRUEplus Lancets 30G MISC Check blood sugar once per day   No facility-administered encounter medications on file as of 07/24/2023.    Allergies (verified) Flexeril  [cyclobenzaprine ], Ace inhibitors, Codeine, Hydrochlorothiazide , Kiwi extract, Metoprolol , and Azithromycin   History: Past Medical History:  Diagnosis Date   Anemia    Anxiety    Arthritis    osteoarthritis - shoulder, knees & hips   Asthma    Coronary artery disease    Depression    Diabetes mellitus without complication (HCC)    no meds   Fibromyalgia    Heart murmur    History of hiatal hernia    Hyperparathyroidism (HCC)    Hypertension    Dr. DOROTHA Budge manages BP, pt. reports MD has not found a need for treatment    Hypothyroidism    Polymyalgia rheumatica (HCC)    Sleep difficulties    had sleep study -2009, prior to gastric surgery, told that there was not a need for f/u   Thyroid  disease    Vitamin B 12 deficiency    Past Surgical History:  Procedure Laterality Date   ABDOMINAL HYSTERECTOMY     BARIATRIC SURGERY     CARDIAC CATHETERIZATION     [facility]- 30 yrs. ago   CATARACT EXTRACTION, BILATERAL     late 2018   LEFT HEART CATH AND CORONARY ANGIOGRAPHY N/A 04/02/2018   Procedure: LEFT HEART CATH AND CORONARY ANGIOGRAPHY;  Surgeon: Swaziland, Peter M, MD;  Location: [facility] INVASIVE CV LAB;  Service: Cardiovascular;  Laterality: N/A;   OOPHORECTOMY     pantallor arthrodesis with rod placement left foot     PARATHYROIDECTOMY Left 03/16/2021   Procedure: LEFT INFERIOR  PARATHYROIDECTOMY;  Surgeon: Eletha Boas, MD;  Location: [facility] ORS;  Service: General;  Laterality: Left;   REVERSE SHOULDER ARTHROPLASTY Right 12/26/2022   Procedure: REVERSE SHOULDER ARTHROPLASTY;  Surgeon: Melita Drivers, MD;  Location: [facility] ORS;  Service: Orthopedics;  Laterality: Right;    TONSILLECTOMY     TOTAL KNEE ARTHROPLASTY Left 11/30/2018   Procedure: TOTAL KNEE ARTHROPLASTY;  Surgeon: Melodi Lerner, MD;  Location: [facility] ORS;  Service: Orthopedics;  Laterality: Left;   TOTAL KNEE ARTHROPLASTY Right 06/28/2019   Procedure: TOTAL KNEE ARTHROPLASTY;  Surgeon: Melodi Lerner, MD;  Location: [facility] OR[facility];  Service: Orthopedics;  Laterality: Right;    TOTAL SHOULDER ARTHROPLASTY Left 03/12/2012   Procedure: TOTAL SHOULDER ARTHROPLASTY;  Surgeon: Drivers CHRISTELLA Melita, MD;  Location: [facility] OR;  Service: Orthopedics;  Laterality: Left;   Family History  Problem Relation Age of Onset   Stroke Mother    Liver cancer Father    Cancer Father        liver   Breast cancer Neg Hx    Social History   Socioeconomic History   Marital status: Widowed    Spouse name: Not on file   Number of children: Not on file   Years of education: Not on file   Highest education level: Associate degree: academic program  Occupational History   Occupation: retired Academic librarian: RETIRED  Tobacco Use   Smoking status: Former    Current packs/day: 0.00    Average packs/day: 0.7 packs/day for 20.0 years (14.0 ttl pk-yrs)    Types: Cigarettes    Start date: 03/04/1958    Quit date: 03/04/1978    Years since quitting: 45.4   Smokeless tobacco: Never  Vaping Use   Vaping status: Never Used  Substance and Sexual Activity   Alcohol  use: Yes    Alcohol /week: 2.0 standard drinks of alcohol     Types: 2 Glasses of wine per week    Comment: with dinner  or social rare -    Drug use: No   Sexual activity: Not Currently    Birth control/protection: Surgical  Other Topics Concern   Not on file  Social History  Narrative   Widowed 2013. 2 kids. 4 grandkids. Oldest daughter lives in [place] and has 2 grandkids that are now in Oceanographer, PT school at [employer].       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 [place])   Does some dog sitting at her home   Live alone /2025/ and 1 dog   Social Drivers of Health   Financial Resource Strain: Low Risk  (07/24/2023)   Overall Financial Resource Strain (CARDIA)    Difficulty of Paying Living Expenses: Not very hard  Food Insecurity: No Food Insecurity (07/24/2023)   Hunger Vital Sign    Worried About Running Out of Food in the Last Year: Never true    Ran Out of Food in the Last Year: Never true  Transportation Needs: No Transportation Needs (07/24/2023)   PRAPARE - Administrator, Civil Service (Medical): No    Lack of Transportation (Non-Medical): No  Physical Activity: Inactive (07/24/2023)   Exercise Vital Sign    Days of Exercise per Week: 0 days    Minutes of Exercise per Session: 0 min  Stress: No Stress Concern Present (07/24/2023)   [employer] of Occupational Health - Occupational Stress Questionnaire    Feeling of Stress: Not at all  Recent Concern: Stress - Stress Concern Present (05/21/2023)   [employer] of Occupational Health - Occupational Stress Questionnaire    Feeling of Stress : To some extent  Social Connections: Moderately Integrated (07/24/2023)   Social Connection and Isolation Panel    Frequency of Communication with Friends and Family: More than three times a week    Frequency of Social Gatherings with Friends and Family: More than three times a week    Attends Religious Services: More than 4 times per year    Active Member of [employer] or Organizations: Yes    Attends Banker Meetings: Never    Marital Status: Widowed    Tobacco Counseling Counseling given: Not Answered    Clinical Intake:  Pre-visit  preparation completed: Yes  Pain : No/denies pain     BMI - recorded: 38.73 Nutritional Status: BMI > 30  Obese Nutritional Risks: None Diabetes: Yes CBG done?: No Did pt. bring in CBG monitor from home?: No  Lab Results  Component Value Date   HGBA1C 6.2 05/22/2023   HGBA1C 6.4 11/26/2022   HGBA1C 6.1 07/11/2022     How often do you need to have someone help you when you read instructions, pamphlets, or other written materials from your doctor or pharmacy?: 1 - Never     Information entered by :: Lafern Brinkley, RMA   Activities of Daily Living     07/24/2023   11:58 AM 12/18/2022   10:13 AM  In your present state of health, do you have any difficulty performing the following activities:  Hearing? 0   Vision? 0   Difficulty concentrating or making decisions? 0   Walking or climbing stairs? 0   Dressing or bathing? 0   Doing errands, shopping? 0 0  Preparing Food and eating ? N   Using the Toilet? N   In the past six months, have you accidently leaked urine? N   Do you have problems with loss of bowel control? N   Managing your Medications? N   Managing your Finances? N   Housekeeping or managing your Housekeeping? N     Patient Care Team: Katrinka Garnette KIDD, MD as PCP - General (Family Medicine) Rosalie Kitchens, MD as Consulting Physician (Gastroenterology) Altheimer, Ozell, MD as Referring Physician (Endocrinology) Leslee Reusing, MD as Consulting Physician (Ophthalmology) Melodi Lerner, MD as Consulting Physician (Orthopedic Surgery) Gladis Cough, MD as Consulting Physician (General Surgery) Renda Glance, MD as Consulting Physician (Urology) Ethyl Lonni BRAVO, MD (Inactive) as Consulting Physician (Otolaryngology)  I have updated your Care Teams any recent Medical Services you may have received from other providers in the past year.     Assessment:   This is a routine wellness examination for [place].  Hearing/Vision screen Hearing  Screening - Comments:: Denies hearing difficulties   Vision Screening - Comments:: Wears eyeglasses/ Dr. Knute opthamology   Goals Addressed               This Visit's Progress     Patient Stated (pt-stated)        Would like to maintain weight/2025       Depression Screen     07/24/2023   12:07 PM 05/22/2023    8:57 AM 09/20/2022    3:41 PM 07/11/2022    8:43 AM 07/11/2022    8:24 AM 03/26/2022   10:59 AM 03/07/2022   11:07 AM  PHQ 2/9 Scores  PHQ - 2 Score 0 0 0 0 1 1 0  PHQ- 9 Score 4 0 1 0  12     Fall Risk     07/24/2023   12:04 PM 09/20/2022    3:40 PM 09/11/2022   10:30 AM 07/11/2022    8:25 AM 03/26/2022   10:56 AM  Fall Risk   Falls in the past year? 0 1 1 1 1   Number falls in past yr: 0 1 1 1  0  Injury with Fall? 0 1 1 1 1   Comment    head injury in ICU for a FEW DAYS   Risk for fall due to :  History of fall(s)  History of fall(s);Impaired balance/gait;Impaired mobility;Impaired vision History of fall(s)  Follow up Falls evaluation completed;Falls prevention discussed Falls evaluation completed Falls evaluation completed  Falls prevention discussed Falls evaluation completed    MEDICARE RISK AT HOME:  Medicare Risk at Home Any stairs in or around the home?: Yes If so, are there any without handrails?: No Home free of loose throw rugs in walkways, pet beds, electrical cords, etc?: Yes Adequate lighting in your home to reduce risk of falls?: Yes Life alert?: Yes Use of a cane, walker or w/c?: Yes (cane and walker) Grab bars in the bathroom?: Yes Shower chair or bench in shower?: Yes Elevated toilet seat or a handicapped toilet?: No  TIMED UP AND GO:  Was the test performed?  No  Cognitive Function: 6CIT completed    05/02/2016   11:32 AM  MMSE - Mini Mental State Exam  Not completed: --        07/11/2022    8:28 AM 02/12/2021    1:54 PM 04/02/2019    2:16 PM  6CIT Screen  What Year? 0 points 0 points 0 points  What month? 0 points 0  points 0 points  What time? 0 points 0 points 0 points  Count back from 20 0 points 0 points 0 points  Months in reverse 0 points 0 points 0 points  Repeat phrase 0 points 0 points 0 points  Total Score 0 points 0 points 0 points    Immunizations Immunization History  Administered Date(s) Administered   Fluad Quad(high Dose 65+) 09/22/2018, 10/19/2019, 10/07/2020, 10/02/2021   Influenza Split 10/22/2010, 11/14/2011, 10/21/2013   Influenza Whole 11/13/2006, 10/27/2008   Influenza, High Dose Seasonal PF 10/28/2014, 09/28/2015, 10/18/2016, 11/19/2017   Influenza,inj,Quad PF,6+ Mos 10/05/2012, 10/05/2013   Influenza-Unspecified 10/21/2013   Moderna Covid-19 Fall Seasonal Vaccine 23yrs & older 10/15/2022   PFIZER(Purple Top)SARS-COV-2 Vaccination 02/11/2019, 03/04/2019, 09/04/2019, 03/11/2020   PNEUMOCOCCAL CONJUGATE-20 10/17/2021   Pfizer Covid-19 Vaccine Bivalent Booster 40yrs & up 11/06/2020, 10/10/2021   Pneumococcal Conjugate-13 03/01/2013, 10/21/2013   Pneumococcal Polysaccharide-23 05/31/2006   RSV,unspecified 10/10/2021   Respiratory Syncytial Virus Vaccine,Recomb Aduvanted(Arexvy) 10/10/2021   Td 11/24/1998, 03/01/2013   Tdap 10/15/2022   Zoster Recombinant(Shingrix ) 10/31/2016, 05/01/2017   Zoster, Live 06/20/2006    Screening Tests Health Maintenance  Topic Date Due   Diabetic kidney evaluation - Urine ACR  Never done   COVID-19 Vaccine (8 - Pfizer risk 2024-25 season) 04/14/2023   Medicare Annual Wellness (AWV)  07/11/2023   FOOT EXAM  07/11/2023   INFLUENZA VACCINE  08/22/2023   HEMOGLOBIN A1C  11/22/2023   OPHTHALMOLOGY EXAM  01/17/2024   Diabetic kidney evaluation - eGFR measurement  05/21/2024   DTaP/Tdap/Td (4 - Td or Tdap) 10/14/2032   Pneumococcal Vaccine: 50+ Years  Completed   DEXA SCAN  Completed   Zoster Vaccines- Shingrix   Completed   Hepatitis B Vaccines  Aged Out   HPV VACCINES  Aged Out   Meningococcal B Vaccine  Aged Out    Health  Maintenance  Health Maintenance Due  Topic Date Due   Diabetic kidney evaluation - Urine ACR  Never done   COVID-19 Vaccine (8 - Pfizer risk 2024-25 season) 04/14/2023   Medicare Annual Wellness (AWV)  07/11/2023   FOOT EXAM  07/11/2023   Health Maintenance Items Addressed: Diabetic Foot Exam recommended, Labs Ordered: UACR  Additional Screening:  Vision Screening: Recommended annual ophthalmology exams for early detection of glaucoma and other disorders of the eye. Would you like a referral to an eye doctor? No    Dental Screening: Recommended annual dental exams for proper oral hygiene  Community Resource Referral /  Chronic Care Management: CRR required this visit?  No   CCM required this visit?  No   Plan:    I have personally reviewed and noted the following in the patient's chart:   Medical and social history Use of alcohol , tobacco or illicit drugs  Current medications and supplements including opioid prescriptions. Patient is not currently taking opioid prescriptions. Functional ability and status Nutritional status Physical activity Advanced directives List of other physicians Hospitalizations, surgeries, and ER visits in previous 12 months Vitals Screenings to include cognitive, depression, and falls Referrals and appointments  In addition, I have reviewed and discussed with patient certain preventive protocols, quality metrics, and best practice recommendations. A written personalized care plan for preventive services as well as general preventive health recommendations were provided to patient.   Job Holts[place] L Thailyn Khalid, CMA   07/24/2023   After Visit Summary: (MyChart) Due to this being a telephonic visit, the after visit summary with patients personalized plan was offered to patient via MyChart   Notes: Patient is due for UACR and order has been placed today for her to do during next office visit.  She is also due for a foot exam, which can also be done during her  next office visit.  Patient had no other concerns to address today.

## 2023-07-26 ENCOUNTER — Other Ambulatory Visit (HOSPITAL_COMMUNITY): Payer: Self-pay

## 2023-07-29 ENCOUNTER — Other Ambulatory Visit: Payer: Self-pay

## 2023-07-30 ENCOUNTER — Other Ambulatory Visit: Payer: Self-pay | Admitting: Cardiovascular Disease

## 2023-07-30 DIAGNOSIS — E1169 Type 2 diabetes mellitus with other specified complication: Secondary | ICD-10-CM

## 2023-07-30 DIAGNOSIS — I2510 Atherosclerotic heart disease of native coronary artery without angina pectoris: Secondary | ICD-10-CM

## 2023-07-31 ENCOUNTER — Encounter: Payer: Self-pay | Admitting: Family Medicine

## 2023-08-25 DIAGNOSIS — H01005 Unspecified blepharitis left lower eyelid: Secondary | ICD-10-CM | POA: Diagnosis not present

## 2023-08-25 DIAGNOSIS — H01002 Unspecified blepharitis right lower eyelid: Secondary | ICD-10-CM | POA: Diagnosis not present

## 2023-08-26 ENCOUNTER — Telehealth: Payer: Self-pay | Admitting: Family Medicine

## 2023-08-26 NOTE — Telephone Encounter (Signed)
 Ozempic  received on 08/26/23. Placed in refrigerator near sink on bottom shelf

## 2023-08-27 NOTE — Telephone Encounter (Signed)
 Called and spoke with pt and made aware.

## 2023-08-27 NOTE — Telephone Encounter (Signed)
 Pt picked up on 08/27/23

## 2023-09-01 ENCOUNTER — Encounter: Payer: Self-pay | Admitting: Family Medicine

## 2023-09-01 ENCOUNTER — Telehealth: Payer: Self-pay | Admitting: Family Medicine

## 2023-09-01 NOTE — Telephone Encounter (Signed)
 We received a shipment of 1mg  Ozempic  for this patient today. She was moved up to the 2mg  dose and she picked that up last week. She says she cannot use the 1mg  dose and we want to make sure her 1mg  dose was cancelled within the Patient Assistance Program she uses. Please advise. She currently has 4 pens of the 2mg  dose. We have 4 pens of the 1mg  dose we cannot use here at the office.

## 2023-09-01 NOTE — Telephone Encounter (Signed)
 Duplicate message. This is being addressed in a separate message.

## 2023-09-03 NOTE — Telephone Encounter (Signed)
 Patient picked up medication in office. Signed form in provider box.

## 2023-09-25 ENCOUNTER — Ambulatory Visit: Admitting: Family Medicine

## 2023-09-25 ENCOUNTER — Ambulatory Visit: Payer: Self-pay | Admitting: Family Medicine

## 2023-09-25 VITALS — BP 122/80 | HR 70 | Temp 97.5°F | Ht 61.0 in | Wt 206.0 lb

## 2023-09-25 DIAGNOSIS — E66812 Obesity, class 2: Secondary | ICD-10-CM | POA: Diagnosis not present

## 2023-09-25 DIAGNOSIS — Z6838 Body mass index (BMI) 38.0-38.9, adult: Secondary | ICD-10-CM | POA: Diagnosis not present

## 2023-09-25 DIAGNOSIS — E661 Drug-induced obesity: Secondary | ICD-10-CM

## 2023-09-25 DIAGNOSIS — E785 Hyperlipidemia, unspecified: Secondary | ICD-10-CM | POA: Diagnosis not present

## 2023-09-25 DIAGNOSIS — I2510 Atherosclerotic heart disease of native coronary artery without angina pectoris: Secondary | ICD-10-CM

## 2023-09-25 DIAGNOSIS — E119 Type 2 diabetes mellitus without complications: Secondary | ICD-10-CM

## 2023-09-25 DIAGNOSIS — E039 Hypothyroidism, unspecified: Secondary | ICD-10-CM

## 2023-09-25 DIAGNOSIS — E1169 Type 2 diabetes mellitus with other specified complication: Secondary | ICD-10-CM

## 2023-09-25 LAB — COMPREHENSIVE METABOLIC PANEL WITH GFR
ALT: 18 U/L (ref 0–35)
AST: 25 U/L (ref 0–37)
Albumin: 4.2 g/dL (ref 3.5–5.2)
Alkaline Phosphatase: 108 U/L (ref 39–117)
BUN: 11 mg/dL (ref 6–23)
CO2: 33 meq/L — ABNORMAL HIGH (ref 19–32)
Calcium: 9.5 mg/dL (ref 8.4–10.5)
Chloride: 97 meq/L (ref 96–112)
Creatinine, Ser: 1 mg/dL (ref 0.40–1.20)
GFR: 52.09 mL/min — ABNORMAL LOW (ref >60.00)
Glucose, Bld: 104 mg/dL — ABNORMAL HIGH (ref 70–99)
Potassium: 4.5 meq/L (ref 3.5–5.1)
Sodium: 139 meq/L (ref 135–145)
Total Bilirubin: 0.7 mg/dL (ref 0.2–1.2)
Total Protein: 7.4 g/dL (ref 6.0–8.3)

## 2023-09-25 LAB — HEMOGLOBIN A1C: Hgb A1c MFr Bld: 6.4 % (ref 4.6–6.5)

## 2023-09-25 LAB — MICROALBUMIN / CREATININE URINE RATIO
Creatinine,U: 68.9 mg/dL
Microalb Creat Ratio: 13.4 mg/g (ref 0.0–30.0)
Microalb, Ur: 0.9 mg/dL (ref 0.0–1.9)

## 2023-09-25 LAB — LDL CHOLESTEROL, DIRECT: Direct LDL: 53 mg/dL

## 2023-09-25 NOTE — Progress Notes (Signed)
 Phone [phone] In person visit   Subjective:   [name] is a 83 y.o. year old very pleasant female patient who presents for/with See problem oriented charting Chief Complaint  Patient presents with   Diabetes   Hypertension   Discussed the use of AI scribe software for clinical note transcription with the patient, who gave verbal consent to proceed.  History of Present Illness   [name] is an 83 year old female with severe osteoarthritis who presents with difficulty walking.  She experiences ongoing difficulty with ambulation due to severe osteoarthritis in her knee. Her walking ability has not improved, leading to frustration. She uses hydrocodone  sparingly, approximately once a month, for severe pain and takes diclofenac  daily. Her walking ability is influenced by her level of physical activity, such as exercise.  She is currently on amlodipine  10 mg for hypertension. She is also on Pristiq  100 mg for depression, with a PHQ-9 score of 3. Her hypothyroidism is managed with levothyroxine  150 mcg, and her cholesterol is controlled with Repatha  140 mg every two weeks. No chest pain or shortness of breath is reported.  Her diabetes is managed with Ozempic , and she is currently using up a 1 mg dose before starting the 2 mg dose. Her last A1c was 6.2. She recently returned from a quilting retreat in [place], where she engaged in physical activity by walking up and down hills, which she managed well despite needing to take breaks.  She has a history of surgeries on both knees and shoulders and notes that her right shoulder, although healed, gets sore frequently. She believes her left leg may be shorter than her right, which she thinks might contribute to her balance issues and joint wear.      Past Medical History-  Patient Active Problem List   Diagnosis Date Noted   History of subdural hematoma 02/27/2022    Priority: High   Hyperparathyroidism, primary  (HCC) 03/14/2021    Priority: High   Osteoporosis 10/29/2020    Priority: High   History of polymyalgia rheumatica 03/16/2018    Priority: High   CAD (coronary artery disease) 11/19/2017    Priority: High   Hypercalcemia 12/29/2014    Priority: High   OA (osteoarthritis) of knee 10/05/2013    Priority: High   Well controlled type 2 diabetes mellitus (HCC) 07/18/2006    Priority: High   Aortic atherosclerosis (HCC) 02/16/2021    Priority: Medium    Mild aortic stenosis 11/16/2020    Priority: Medium    B12 deficiency 05/31/2019    Priority: Medium    Overactive bladder 09/03/2016    Priority: Medium    Morbid obesity (HCC) 06/28/2015    Priority: Medium    History of gastric bypass 02/08/2014    Priority: Medium    Hyperlipidemia associated with type 2 diabetes mellitus (HCC) 01/05/2008    Priority: Medium    Hypothyroidism 09/09/2007    Priority: Medium    Major depression, recurrent, full remission (HCC) 07/18/2006    Priority: Medium    Essential hypertension 07/18/2006    Priority: Medium    Asthma 07/18/2006    Priority: Medium    Primary osteoarthritis of right knee 06/28/2019    Priority: 1.   CHEST WALL PAIN, ANTERIOR 10/09/2007    Priority: 1.   Allergic rhinitis 07/20/2014    Priority: Low    Medications- reviewed and updated Current Outpatient Medications  Medication Sig Dispense Refill   acetaminophen  (TYLENOL ) 500 MG  tablet Take 500 mg by mouth every 6 (six) hours as needed for mild pain (pain score 1-3) or moderate pain (pain score 4-6).     albuterol  (VENTOLIN  HFA) 108 (90 Base) MCG/ACT inhaler Inhale 1-2 puffs into the lungs every 4 (four) hours as needed for wheezing or shortness of breath. 18 g 2   amLODipine  (NORVASC ) 10 MG tablet Take 1 tablet (10 mg total) by mouth daily. 90 tablet 2   Biotin 5000 MCG CAPS Take 5,000 mcg by mouth daily.     Blood Glucose Monitoring Suppl (TRUE METRIX AIR GLUCOSE METER) DEVI Use to test blood sugars daily. Dx:  E11.9 1 each 5   Cholecalciferol (VITAMIN D -3) 125 MCG (5000 UT) TABS Take 5,000 Units by mouth daily.     cyanocobalamin  (,VITAMIN B-12,) 1000 MCG/ML injection INJECT 1ML (1,000 MCG TOTAL)  INTO THE MUSCLE EVERY 30 DAYS USE ON A MONTHLY BASES 10 mL 0   desvenlafaxine  (PRISTIQ ) 100 MG 24 hr tablet Take 1 tablet (100 mg total) by mouth daily. 90 tablet 3   diclofenac  (VOLTAREN ) 75 MG EC tablet Take 1 tablet (75 mg total) by mouth 2 (two) times daily. 180 tablet 3   FeFum-FePo-FA-B Cmp-C-Zn-Mn-Cu (SE-TAN PLUS ) 162-115.2-1 MG CAPS Take 1 capsule by mouth 2 (two) times daily. 180 capsule 3   fluticasone  (FLONASE ) 50 MCG/ACT nasal spray Place 1 spray into both nostrils daily.     glucose blood test strip Test blood sugar once per day 100 each 3   Levothyroxine  Sodium 150 MCG CAPS Take 150 mcg by mouth daily. Tirosint      loperamide  (IMODIUM ) 2 MG capsule Take 4 mg by mouth daily as needed for diarrhea or loose stools.      nitroGLYCERIN  (NITROSTAT ) 0.4 MG SL tablet Place 1 tablet (0.4 mg total) under the tongue every 5 (five) minutes as needed for chest pain. 50 tablet 3   Omega-3 Fatty Acids (FISH OIL PO) Take 5,000 mg by mouth daily.     Propylene Glycol (SYSTANE COMPLETE OP) Place 1 drop into both eyes in the morning.     REPATHA  SURECLICK 140 MG/ML SOAJ INJECT 140 MG  SUBCUTANEOUSLY EVERY 14 DAYS 6 mL 0   Semaglutide , 1 MG/DOSE, 4 MG/3ML SOPN Inject 2 mg into the skin once a week.     TRUEplus Lancets 30G MISC Check blood sugar once per day 100 each 3   No current facility-administered medications for this visit.     Objective:  BP 122/80 (BP Location: Left Arm, Patient Position: Sitting, Cuff Size: Normal)   Pulse 70   Temp (!) 97.5 F (36.4 C) (Temporal)   Ht 5' 1 (1.549 m)   Wt 206 lb (93.4 kg)   SpO2 97%   BMI 38.92 kg/m  Gen: NAD, resting comfortably CV: RRR no murmurs rubs or gallops Lungs: CTAB no crackles, wheeze, rhonchi Ext: trace edema Skin: warm, dry Neuro: walks with  cane    Assessment and Plan   #social update - dog survived accident and doing well after losing part of tail.  She had a wonderful quilting adventure in [place]  and plans to go back in the spring!     # Osteoarthritis of knee -Ausing difficulty with walking. Prefers to avoid surgery and uses hydrocodone  sparingly, approximately once a month, for severe pain. Currently taking diclofenac  once daily for pain management and finds somewhat helpful. - Check kidney function due to ongoing diclofenac  use  # Type 2 diabetes mellitus -Well controlled  with Ozempic . Current A1c is 6.2. On 2 mg of Ozempic , although there was a previous error in the medication list indicating 1 mg. - Check A1c today - Continue Ozempic  2 mg as tolerated  # Hypertension -Well controlled with amlodipine  10 mg. Current blood pressure is 122/80, improved from previous reading of 138/unknown. - Continue amlodipine  10 mg  # Coronary artery disease -Asymptomatic with no current chest pain or shortness of breath. Consistent with Repatha  140 mg every two weeks. Scheduled to see cardiology - Cardiology has not recommended aspirin  - Continue Repatha  140 mg every two weeks - Follow up with Dr. Barbaraann with first visit in October  # Hypothyroidism -Well controlled with levothyroxine  150 mcg. Recent visit with [facility] confirmed stability, and no follow-up needed for a year. - Continue levothyroxine  150 mcg  # Major depressive disorder, recurrent, in full remission -In full remission with Pristiq  100 mg. PHQ-9 score is 3, indicating well-controlled symptoms. - Continue Pristiq  100 mg     Recommended follow up: Return in about 4 months (around 01/25/2024) for physical or sooner if needed.Schedule b4 you leave. Future Appointments  Date Time Provider Department Center  11/14/2023  3:00 PM O'Neal, Darryle Ned, MD [facility]  11/18/2023 10:00 AM Carla Milling, RPH-CPP [facility] None  07/28/2024 11:20 AM  [facility] ANNUAL WELLNESS VISIT 1 [facility] [name]    Lab/Order associations:   ICD-10-CM   1. Coronary artery disease involving native coronary artery of native heart without angina pectoris  I25.10     2. Well controlled type 2 diabetes mellitus (HCC)  E11.9 Urine Albumin/Creatinine with ratio (send out) [LAB689]    Comprehensive metabolic panel with GFR    Hemoglobin A1c    LDL cholesterol, direct    3. Hypothyroidism, unspecified type  E03.9     4. Hyperlipidemia associated with type 2 diabetes mellitus (HCC)  E11.69 LDL cholesterol, direct   Z21.4     5. Morbid obesity (HCC) Chronic E66.01     6. Class 2 drug-induced obesity with serious comorbidity and body mass index (BMI) of 38.0 to 38.9 in adult  E66.812    E66.1    Z68.38      No orders of the defined types were placed in this encounter.   Return precautions advised.  Garnette Lukes, MD

## 2023-09-25 NOTE — Patient Instructions (Addendum)
 Please stop by lab before you go If you have mychart- we will send your results within 3 business days of us  receiving them.  If you do not have mychart- we will call you about results within 5 business days of us  receiving them.  *please also note that you will see labs on mychart as soon as they post. I will later go in and write notes on them- will say notes from Dr. Katrinka   No changes today unless labs lead us  to make changes  Thrilled you are doing so well!   Recommended follow up: Return in about 4 months (around 01/25/2024) for physical or sooner if needed.Schedule b4 you leave.

## 2023-10-17 ENCOUNTER — Telehealth: Payer: Self-pay | Admitting: Pharmacy Technician

## 2023-10-17 ENCOUNTER — Other Ambulatory Visit (HOSPITAL_COMMUNITY): Payer: Self-pay

## 2023-10-17 ENCOUNTER — Telehealth: Payer: Self-pay | Admitting: Cardiovascular Disease

## 2023-10-17 ENCOUNTER — Encounter: Payer: Self-pay | Admitting: Pharmacist

## 2023-10-17 ENCOUNTER — Other Ambulatory Visit: Payer: Self-pay

## 2023-10-17 DIAGNOSIS — E1169 Type 2 diabetes mellitus with other specified complication: Secondary | ICD-10-CM

## 2023-10-17 DIAGNOSIS — I2510 Atherosclerotic heart disease of native coronary artery without angina pectoris: Secondary | ICD-10-CM

## 2023-10-17 MED ORDER — REPATHA SURECLICK 140 MG/ML ~~LOC~~ SOAJ
140.0000 mg | SUBCUTANEOUS | 1 refills | Status: AC
  Filled 2023-10-17: qty 6, 84d supply, fill #0
  Filled 2024-02-10: qty 6, 84d supply, fill #1

## 2023-10-17 NOTE — Telephone Encounter (Signed)
    Grant still good. I called [facility] and they said her grant is on there but they need a refill  Message sent and pt notified

## 2023-10-17 NOTE — Telephone Encounter (Signed)
 Pt c/o medication issue:  1. Name of Medication: REPATHA  SURECLICK 140 MG/ML SOAJ   2. How are you currently taking this medication (dosage and times per day)?  INJECT 140 MG SUBCUTANEOUSLY EVERY 14 DAYS     3. Are you having a reaction (difficulty breathing--STAT)? No  4. What is your medication issue? Pt is requesting a callback regarding her wanting to discuss this medication being expensive. Please advise.

## 2023-10-17 NOTE — Telephone Encounter (Signed)
 Patient called back and is asking for this to be filled at [facility]  instead of [facility]

## 2023-11-06 ENCOUNTER — Encounter: Payer: Self-pay | Admitting: Pharmacist

## 2023-11-06 ENCOUNTER — Other Ambulatory Visit (HOSPITAL_COMMUNITY): Payer: Self-pay

## 2023-11-06 ENCOUNTER — Other Ambulatory Visit: Payer: Self-pay

## 2023-11-06 ENCOUNTER — Other Ambulatory Visit: Payer: Self-pay | Admitting: Family Medicine

## 2023-11-06 MED ORDER — AMLODIPINE BESYLATE 10 MG PO TABS
10.0000 mg | ORAL_TABLET | Freq: Every day | ORAL | 2 refills | Status: AC
  Filled 2023-11-06: qty 90, 90d supply, fill #0
  Filled 2024-02-10: qty 90, 90d supply, fill #1

## 2023-11-13 NOTE — Progress Notes (Unsigned)
 Cardiology Office Note:  .   Date:  11/14/2023  ID:  [name], DOB [birth year], MRN [id number] PCP: Katrinka Garnette KIDD, MD  [facility] Providers Cardiologist:  None  History of Present Illness: .    Chief Complaint  Patient presents with   Follow-up         [name] is a 83 y.o. female with history of CAD, HLD who presents for follow-up.    History of Present Illness   [name] is an 83 year old female with coronary artery disease and hyperlipidemia who presents for follow-up.  She has a history of coronary artery disease and hyperlipidemia, with past heart catheterizations, the latest in 2020, showing areas not requiring stenting. She reports no current chest pain or trouble breathing. She takes amlodipine  10 mg daily for hypertension management.  She manages her diabetes through dietary modifications and reports no significant issues related to it.  She experienced a fall resulting in a loss of consciousness, necessitating ICU admission at [facility]. A neurologist followed her for a year post-incident, and she reports no lasting neurological damage. She now uses a medical alert system and lives alone, though she has two daughters, one of whom resides nearby.  She has a history of esophageal hernia, identified during a brain scan. She previously experienced chest-like pains, which were determined not to be cardiac-related after multiple ER visits and tests. She has not had these pains recently.  She engages in physical activity, including swimming once a week and balance and strengthening exercises once a week, each lasting about an hour. She experiences soreness after swimming but no dyspnea during activities.  She has a history of aortic stenosis, with an ultrasound in 2022 showing mild aortic stenosis. She reports occasional leg swelling, particularly in the left leg, which has undergone surgery and has plates and screws.  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          Problem List CAD -LHC 04/02/2018: dLCX 70%; D1 75% 2. HLD -T chol 123, HDL 51, LDL 51, TG 125 3. Aortic stenosis -mild 2022  4. HTN    ROS: All other ROS reviewed and negative. Pertinent positives noted in the HPI.     Studies Reviewed: SABRA   EKG Interpretation Date/Time:  Friday November 14 2023 14:55:17 EDT Ventricular Rate:  73 PR Interval:  174 QRS Duration:  96 QT Interval:  370 QTC Calculation: 407 R Axis:   62  Text Interpretation: Normal sinus rhythm Inferior infarct , age undetermined Confirmed by Barbaraann Kotyk [phone]) on 11/14/2023 3:04:26 PM   NM Stress 08/30/2019 There was no ST segment deviation noted during stress. This is a low risk study. The left ventricular ejection fraction is normal (55-65%).  TTE 10/31/2020 1. The aortic valve is tricuspid. There is mild calcification of the  aortic valve. There is mild thickening of the aortic valve. Aortic valve  regurgitation is not visualized. Mild aortic valve stenosis. Aortic valve  area, by VTI measures 1.66 cm.  Aortic valve mean gradient measures 10.5 mmHg. Aortic valve Vmax measures  2.29 m/s.   2. Left ventricular ejection fraction, by estimation, is 60 to 65%. The  left ventricle has normal function. The left ventricle has no regional  wall motion abnormalities. There is mild concentric left ventricular  hypertrophy. Left ventricular diastolic  parameters are consistent with Grade I diastolic dysfunction (  impaired  relaxation).   3. Right ventricular systolic function is normal. The right ventricular  size is normal. There is normal pulmonary artery systolic pressure. The  estimated right ventricular systolic pressure is 26.2 mmHg.   4. The mitral valve is grossly normal. Trivial mitral valve  regurgitation. No evidence of mitral stenosis.   5. The inferior vena cava is normal  in size with greater than 50%  respiratory variability, suggesting right atrial pressure of 3 mmHg.     Physical Exam:   VS:  BP (!) 148/76   Pulse 74   Ht 5' 1 (1.549 m)   Wt 206 lb 3.2 oz (93.5 kg)   SpO2 95%   BMI 38.96 kg/m    Wt Readings from Last 3 Encounters:  11/14/23 206 lb 3.2 oz (93.5 kg)  09/25/23 206 lb (93.4 kg)  07/24/23 205 lb (93 kg)    GEN: Well nourished, well developed in no acute distress NECK: No JVD; No carotid bruits CARDIAC: RRR, 2/6 SEM, rubs, gallops RESPIRATORY:  Clear to auscultation without rales, wheezing or rhonchi  ABDOMEN: Soft, non-tender, non-distended EXTREMITIES:  No edema; No deformity  ASSESSMENT AND PLAN: .   Assessment and Plan    Coronary artery disease Asymptomatic with no chest pain or dyspnea. No stents required as of 2020. - Restart aspirin  81 mg daily.   HTN - norvasc  10 mg daily.   Mild aortic stenosis Benign murmur with no significant symptoms. - Order echocardiogram to monitor aortic stenosis.  Hyperlipidemia LDL level at 53 mg/dL, well-controlled with Repatha . - Refill Repatha  prescription. - Coordinate with pharmacy team for continued access to Repatha  through foundation.              Follow-up: Return in about 1 year (around 11/13/2024).   Signed, [name]. Barbaraann, MD, [facility]  [facility]  [address], [place] [address] [phone]  3:37 PM

## 2023-11-14 ENCOUNTER — Encounter: Payer: Self-pay | Admitting: Cardiovascular Disease

## 2023-11-14 ENCOUNTER — Ambulatory Visit: Attending: Cardiovascular Disease | Admitting: Cardiovascular Disease

## 2023-11-14 VITALS — BP 148/76 | HR 74 | Ht 61.0 in | Wt 206.2 lb

## 2023-11-14 DIAGNOSIS — E785 Hyperlipidemia, unspecified: Secondary | ICD-10-CM

## 2023-11-14 DIAGNOSIS — I2510 Atherosclerotic heart disease of native coronary artery without angina pectoris: Secondary | ICD-10-CM

## 2023-11-14 DIAGNOSIS — E1169 Type 2 diabetes mellitus with other specified complication: Secondary | ICD-10-CM

## 2023-11-14 DIAGNOSIS — I350 Nonrheumatic aortic (valve) stenosis: Secondary | ICD-10-CM

## 2023-11-14 MED ORDER — ASPIRIN 81 MG PO TBEC: 81.0000 mg | DELAYED_RELEASE_TABLET | Freq: Every day | ORAL | Status: AC

## 2023-11-14 NOTE — Patient Instructions (Signed)
 Medication Instructions:  Your physician has recommended you make the following change in your medication:  START Aspirin  81 mg once daily  *If you need a refill on your cardiac medications before your next appointment, please call your pharmacy*  Lab Work: None ordered   Testing/Procedures: Your physician has requested that you have an echocardiogram. Echocardiography is a painless test that uses sound waves to create images of your heart. It provides your doctor with information about the size and shape of your heart and how well your heart's chambers and valves are working. This procedure takes approximately one hour. There are no restrictions for this procedure. Please do NOT wear cologne, perfume, aftershave, or lotions (deodorant is allowed). Please arrive 15 minutes prior to your appointment time.  Please note: We ask at that you not bring children with you during ultrasound (echo/ vascular) testing. Due to room size and safety concerns, children are not allowed in the ultrasound rooms during exams. Our front office staff cannot provide observation of children in our lobby area while testing is being conducted. An adult accompanying a patient to their appointment will only be allowed in the ultrasound room at the discretion of the ultrasound technician under special circumstances. We apologize for any inconvenience.     Follow-Up: At [facility], you and your health needs are our priority.  As part of our continuing mission to provide you with exceptional heart care, our providers are all part of one team.  This team includes your primary Cardiologist (physician) and Advanced Practice Providers or APPs (Physician Assistants and Nurse Practitioners) who all work together to provide you with the care you need, when you need it.  Your next appointment:   1 year(s)  Provider:   APP   Thank you for choosing [facility]!!   [phone]

## 2023-11-18 ENCOUNTER — Other Ambulatory Visit: Payer: Medicare HMO | Admitting: Pharmacist

## 2023-11-18 NOTE — Progress Notes (Signed)
 11/18/2023 Name: [name] MRN: [id number] DOB: 11/25/1940  Chief Complaint  Patient presents with   Medication Management    [name] is a 83 y.o. year old female who presented for a telephone visit.   They were referred to the pharmacist by their PCP for assistance in managing medication access.    Subjective:  Care Team: Primary Care Provider: Katrinka Garnette KIDD, MD ; Next Scheduled Visit: 01/27/2024 Cardiologist: Dr Barbaraann; Next Scheduled Visit: recall scheduled for 09/2024  Medication Access/Adherence  Current Pharmacy:  [facility] [address] [place] - [address] [place] [address] Phone: [phone] Fax: [phone]  [facility] - [address] [place] - [address] [place] [address] Phone: [phone] Fax: [phone]  [facility] - [facility] [address] [place] [address] Phone: [phone] Fax: [phone]   Patient reports affordability concerns with their medications: Yes  - She has been enrolled in Novo Nordisk patient assistance program for 2025 and has received Ozempic  but will not be eligible to receive in 2026 due to program changes.  She also has a Orthoptist that she has used for cost of Repatha  - will expire 12/03/2023 Patient reports access/transportation concerns to their pharmacy: No  Patient reports adherence concerns with their medications:  No      Objective:  Lab Results  Component Value Date   HGBA1C 6.4 09/25/2023    Lab Results  Component Value Date   CREATININE 1.00 09/25/2023   BUN 11 09/25/2023   NA 139 09/25/2023   K 4.5 09/25/2023   CL 97 09/25/2023   CO2 33 (H) 09/25/2023    Lab Results  Component Value Date   CHOL 123 11/26/2022   HDL 51 11/26/2022   LDLCALC 51 11/26/2022   LDLDIRECT 53.0 09/25/2023   TRIG 125 11/26/2022   CHOLHDL 3 01/08/2022     Medications Reviewed Today     Reviewed by Carla Milling, RPH-CPP (Pharmacist) on 11/18/23 at 1114  Med List Status: <None>   Medication Order Taking? Sig Documenting Provider Last Dose Status Informant  acetaminophen  (TYLENOL ) 500 MG tablet [id number]  Take 500 mg by mouth every 6 (six) hours as needed for mild pain (pain score 1-3) or moderate pain (pain score 4-6). [provider]  Active   albuterol  (VENTOLIN  HFA) 108 (90 Base) MCG/ACT inhaler [id number]  Inhale 1-2 puffs into the lungs every 4 (four) hours as needed for wheezing or shortness of breath. Katrinka Garnette KIDD, MD  Active Self  amLODipine  (NORVASC ) 10 MG tablet [id number]  Take 1 tablet (10 mg total) by mouth daily. Katrinka Garnette KIDD, MD  Active   aspirin  EC 81 MG tablet [id number]  Take 1 tablet (81 mg total) by mouth daily. Swallow whole. Barbaraann Darryle Ned, MD  Active   Biotin 5000 MCG CAPS [id number]  Take 5,000 mcg by mouth daily. [provider]  Active Self  Blood Glucose Monitoring Suppl (TRUE METRIX AIR GLUCOSE METER) DEVI [id number]  Use to test blood sugars daily. Dx: E11.9 Katrinka Garnette KIDD, MD  Active Self  Cholecalciferol (VITAMIN D -3) 125 MCG (5000 UT) TABS [id number]  Take 5,000 Units by mouth daily. [provider]  Active Self  cyanocobalamin  (,VITAMIN B-12,) 1000 MCG/ML injection [id number]  INJECT 1ML (1,000 MCG TOTAL)  INTO THE MUSCLE EVERY 30 DAYS USE ON A MONTHLY BASES Katrinka Garnette KIDD, MD  Active Self  Med Note CHRISTIE ALEXANDER   Wed Dec 11, 2022  1:05 PM) As needed  desvenlafaxine  (PRISTIQ ) 100 MG 24 hr tablet [id number]  Take 1 tablet (100 mg total) by mouth daily. Katrinka Garnette KIDD, MD  Active   diclofenac  (VOLTAREN ) 75 MG EC tablet [id number]  Take 1 tablet (75 mg total) by mouth 2 (two) times daily. Katrinka Garnette KIDD, MD  Active   Evolocumab  (REPATHA  SURECLICK) 140 MG/ML EMMANUEL [id number]  Inject 140 mg into the skin every 14 (fourteen) days. Barbaraann Darryle Ned, MD   Active   FeFum-FePo-FA-B Cmp-C-Zn-Mn-Cu (SE-TAN PLUS ) 162-115.2-1 MG CAPS [id number]  Take 1 capsule by mouth 2 (two) times daily. Katrinka Garnette KIDD, MD  Active   fluticasone  (FLONASE ) 50 MCG/ACT nasal spray [id number]  Place 1 spray into both nostrils daily. [provider]  Active Self  glucose blood test strip [id number]  Test blood sugar once per day Katrinka Garnette KIDD, MD  Active Self  Levothyroxine  Sodium 150 MCG CAPS [id number]  Take 150 mcg by mouth daily. Tirosint  [provider]  Active Self           Med Note JACKOLYN APOLINAR SAUNDERS   Thu Oct 26, 2020  1:41 PM)    loperamide  (IMODIUM ) 2 MG capsule [id number]  Take 4 mg by mouth daily as needed for diarrhea or loose stools.  [provider]  Active Self  nitroGLYCERIN  (NITROSTAT ) 0.4 MG SL tablet [id number]  Place 1 tablet (0.4 mg total) under the tongue every 5 (five) minutes as needed for chest pain. Katrinka Garnette KIDD, MD  Active Self  Omega-3 Fatty Acids (FISH OIL PO) [id number]  Take 5,000 mg by mouth daily. [provider]  Active Self  Propylene Glycol (SYSTANE COMPLETE OP) [id number]  Place 1 drop into both eyes in the morning. [provider]  Active Self  Semaglutide , 1 MG/DOSE, 4 MG/3ML SOPN [id number]  Inject 2 mg into the skin once a week. Carla Milling, RPH-CPP  Active   TRUEplus Lancets 30G MISC [id number]  Check blood sugar once per day Katrinka Garnette KIDD, MD  Active Self              Assessment/Plan:   Medication Management / Access - Discussed changes to Novo Nordisk / Ozempic  medication assistance program for 2026.  - Discussed her expected 2026 cost for Ozempic  using her [employer] Gold Plan - first fill in 2026 would be $297 and then cost would be $47 / month until she reaches $2100 out of pocket max and then cost would be $0 until the end of 2026.  - Patient is due to renew [employer] for hypercholesterolemia. Will reach out to cardiology pharmacy team to make sure they plan to  renew. If she fill Repatha  first in 2026, this might take care of her deductible.    Follow Up Plan: 1 month to check on [facility] Lorrene Milling Carla, PharmD Clinical Pharmacist [facility]  Population Health [phone]

## 2023-12-22 ENCOUNTER — Other Ambulatory Visit (HOSPITAL_COMMUNITY): Payer: Self-pay

## 2023-12-23 ENCOUNTER — Ambulatory Visit (HOSPITAL_COMMUNITY)
Admission: RE | Admit: 2023-12-23 | Discharge: 2023-12-23 | Disposition: A | Source: Ambulatory Visit | Attending: Cardiovascular Disease

## 2023-12-23 ENCOUNTER — Telehealth: Payer: Self-pay | Admitting: Pharmacist

## 2023-12-23 DIAGNOSIS — I350 Nonrheumatic aortic (valve) stenosis: Secondary | ICD-10-CM | POA: Insufficient documentation

## 2023-12-23 LAB — ECHOCARDIOGRAM COMPLETE
AR max vel: 2.27 cm2
AV Area VTI: 1.86 cm2
AV Area mean vel: 2.11 cm2
AV Mean grad: 8.3 mmHg
AV Peak grad: 16.5 mmHg
Ao pk vel: 2.03 m/s
Area-P 1/2: 2.8 cm2
S' Lateral: 3.11 cm

## 2023-12-23 NOTE — Progress Notes (Signed)
 Had sent reorder request for Ozempic  2mg  12/11/2023. I call [employer] today to check on status. Unfortunately they only allow 3 fills per year - Patient received 4 boxes = 112 day supply. Patient received delivery in January 2025, April 2025 and August 2025.  Called [name] to let her know. She states she has enough Ozempic  2mg  to last until January 2026.   She also mentioned that her [employer] grant for hypercholesterolemia / Repatha  has expired. Check [employer] site and it ended 12/02/2013.  Will check with Med Assist Team that helps with cardio to see if they can start her re-enrollment.   Plan is to fill Repatha  at beginning of January and hopefully meet her 2026 deductible. Then Ozempic  cost would be $47 / month

## 2023-12-24 ENCOUNTER — Ambulatory Visit: Payer: Self-pay | Admitting: Cardiovascular Disease

## 2023-12-30 ENCOUNTER — Other Ambulatory Visit (HOSPITAL_COMMUNITY): Payer: Self-pay

## 2023-12-31 ENCOUNTER — Telehealth: Payer: Self-pay | Admitting: Pharmacy Technician

## 2023-12-31 ENCOUNTER — Other Ambulatory Visit (HOSPITAL_COMMUNITY): Payer: Self-pay

## 2023-12-31 NOTE — Telephone Encounter (Signed)
 Patient Advocate Encounter   The patient was approved for a [employer] grant that will help cover the cost of repatha  Total amount awarded, 2500.00.  Effective: 12/04/23 - 12/02/24   APW:[id number] ERW:[id number] Hmnle:[id number] PI:[id number]  [employer] ID: [id number]   Pharmacy provided with approval and processing information. Patient informed via mychart

## 2024-01-05 ENCOUNTER — Other Ambulatory Visit: Payer: Self-pay | Admitting: Family Medicine

## 2024-01-12 ENCOUNTER — Encounter: Payer: Self-pay | Admitting: Family Medicine

## 2024-01-12 NOTE — Telephone Encounter (Signed)
 Message sent to front desk for new patient scheduling.

## 2024-01-16 ENCOUNTER — Ambulatory Visit: Admission: EM | Admit: 2024-01-16 | Discharge: 2024-01-16 | Disposition: A | Discharge status: Home / Self Care

## 2024-01-16 ENCOUNTER — Ambulatory Visit (INDEPENDENT_AMBULATORY_CARE_PROVIDER_SITE_OTHER)

## 2024-01-16 ENCOUNTER — Ambulatory Visit: Payer: Self-pay

## 2024-01-16 DIAGNOSIS — B349 Viral infection, unspecified: Secondary | ICD-10-CM | POA: Diagnosis not present

## 2024-01-16 LAB — POC COVID19/FLU A&B COMBO
Covid Antigen, POC: NEGATIVE
Influenza A Antigen, POC: NEGATIVE
Influenza B Antigen, POC: NEGATIVE

## 2024-01-16 MED ORDER — AZELASTINE HCL 0.1 % NA SOLN: 1.0000 | Freq: Two times a day (BID) | NASAL | 1 refills | Status: AC

## 2024-01-16 MED ORDER — PREDNISONE 20 MG PO TABS: 40.0000 mg | ORAL_TABLET | Freq: Every day | ORAL | 0 refills | Status: AC

## 2024-01-16 MED ORDER — PROMETHAZINE-DM 6.25-15 MG/5ML PO SYRP: 7.5000 mL | ORAL_SOLUTION | Freq: Three times a day (TID) | ORAL | 0 refills | Status: AC | PRN

## 2024-01-16 NOTE — ED Provider Notes (Signed)
 " [facility]  Note:  This document was prepared using Dragon voice recognition software and may include unintentional dictation errors.  MRN: [id number] DOB: 15-Aug-1940  Subjective:   LAQUISHA NORTHCRAFT is a 83 y.o. female presenting for sore throat, body aches, chest congestion with cough x 2 days.  Patient denies any known sick contacts.  Patient reports taking some over-the-counter cough and cold medication with minimal improvement.  No shortness of breath, severe chest pain, weakness, dizziness.  Current Medications[1]   Allergies[2]  Past Medical History:  Diagnosis Date   Anemia    Anxiety    Arthritis    osteoarthritis - shoulder, knees & hips   Asthma    Coronary artery disease    Depression    Diabetes mellitus without complication (HCC)    no meds   Fibromyalgia    Heart murmur    History of hiatal hernia    Hyperparathyroidism    Hypertension    Dr. DOROTHA Budge manages BP, pt. reports MD has not found a need for treatment    Hypothyroidism    Polymyalgia rheumatica    Sleep difficulties    had sleep study -2009, prior to gastric surgery, told that there was not a need for f/u   Thyroid  disease    Vitamin B 12 deficiency      Past Surgical History:  Procedure Laterality Date   ABDOMINAL HYSTERECTOMY     BARIATRIC SURGERY     CARDIAC CATHETERIZATION     [facility]- 30 yrs. ago   CATARACT EXTRACTION, BILATERAL     late 2018   LEFT HEART CATH AND CORONARY ANGIOGRAPHY N/A 04/02/2018   Procedure: LEFT HEART CATH AND CORONARY ANGIOGRAPHY;  Surgeon: Jordan, Peter M, MD;  Location: [facility] INVASIVE CV LAB;  Service: Cardiovascular;  Laterality: N/A;   OOPHORECTOMY     pantallor arthrodesis with rod placement left foot     PARATHYROIDECTOMY Left 03/16/2021   Procedure: LEFT INFERIOR PARATHYROIDECTOMY;  Surgeon: Eletha Boas, MD;  Location: [facility];  Service: General;  Laterality: Left;   REVERSE SHOULDER ARTHROPLASTY Right 12/26/2022   Procedure: REVERSE SHOULDER  ARTHROPLASTY;  Surgeon: Melita Drivers, MD;  Location: [facility];  Service: Orthopedics;  Laterality: Right;    TONSILLECTOMY     TOTAL KNEE ARTHROPLASTY Left 11/30/2018   Procedure: TOTAL KNEE ARTHROPLASTY;  Surgeon: Melodi Lerner, MD;  Location: [facility];  Service: Orthopedics;  Laterality: Left;   TOTAL KNEE ARTHROPLASTY Right 06/28/2019   Procedure: TOTAL KNEE ARTHROPLASTY;  Surgeon: Melodi Lerner, MD;  Location: [facility];  Service: Orthopedics;  Laterality: Right;    TOTAL SHOULDER ARTHROPLASTY Left 03/12/2012   Procedure: TOTAL SHOULDER ARTHROPLASTY;  Surgeon: Drivers CHRISTELLA Melita, MD;  Location: [facility];  Service: Orthopedics;  Laterality: Left;    Family History  Problem Relation Age of Onset   Stroke Mother    Liver cancer Father    Cancer Father        liver   Breast cancer Neg Hx     Social History[3]  ROS Refer to HPI for ROS details.  Objective:    Vitals: BP (!) 170/94 (BP Location: Left Arm)   Pulse 67   Temp 98.1 F (36.7 C) (Oral)   Resp 16   Ht 5' 1 (1.549 m)   Wt 210 lb (95.3 kg)   SpO2 94%   BMI 39.68 kg/m   Physical Exam Vitals and nursing note reviewed.  Constitutional:      General: She  is not in acute distress.    Appearance: Normal appearance. She is well-developed. She is not ill-appearing or toxic-appearing.  HENT:     Head: Normocephalic and atraumatic.     Nose: Congestion present. No rhinorrhea.     Mouth/Throat:     Mouth: Mucous membranes are moist.     Pharynx: Oropharynx is clear.  Cardiovascular:     Rate and Rhythm: Normal rate.  Pulmonary:     Effort: Pulmonary effort is normal. No respiratory distress.     Breath sounds: No stridor. Wheezing present. No rhonchi or rales.  Chest:     Chest wall: Tenderness present.  Musculoskeletal:        General: Normal range of motion.  Skin:    General: Skin is warm and dry.  Neurological:     General: No focal deficit present.     Mental Status: She is alert and oriented to person,  place, and time.  Psychiatric:        Mood and Affect: Mood normal.        Behavior: Behavior normal.     Procedures  Results for orders placed or performed during the hospital encounter of 01/16/24 (from the past 24 hours)  POC Covid19/Flu A&B Antigen     Status: None   Collection Time: 01/16/24  7:24 PM  Result Value Ref Range   Influenza A Antigen, POC Negative Negative   Influenza B Antigen, POC Negative Negative   Covid Antigen, POC Negative Negative    Assessment and Plan :     Discharge Instructions       1. Acute viral syndrome (Primary) - POC Covid19/Flu A&B Antigen complete in UC is negative for COVID and influenza - DG Chest 2 View x-ray completed in UC shows no acute cardiopulmonary processes, no sign of consolidation or pneumonia, normal chest x-ray. - predniSONE  (DELTASONE ) 20 MG tablet; Take 2 tablets (40 mg total) by mouth daily for 5 days.  Dispense: 10 tablet; Refill: 0 - promethazine -dextromethorphan (PROMETHAZINE -DM) 6.25-15 MG/5ML syrup; Take 7.5 mLs by mouth 3 (three) times daily as needed.  Dispense: 240 mL; Refill: 0 - azelastine  (ASTELIN ) 0.1 % nasal spray; Place 1 spray into both nostrils 2 (two) times daily. Use in each nostril as directed  Dispense: 30 mL; Refill: 1  -Continue to monitor symptoms for any change in severity if there is any escalation of current symptoms or development of new symptoms follow-up in ER for further evaluation and management.       Joelynn Dust B Lurene Robley    [1] No current facility-administered medications for this encounter.  Current Outpatient Medications:    acetaminophen  (TYLENOL ) 500 MG tablet, Take 500 mg by mouth every 6 (six) hours as needed for mild pain (pain score 1-3) or moderate pain (pain score 4-6)., Disp: , Rfl:    albuterol  (VENTOLIN  HFA) 108 (90 Base) MCG/ACT inhaler, Inhale 1-2 puffs into the lungs every 4 (four) hours as needed for wheezing or shortness of breath., Disp: 18 g, Rfl: 2   amLODipine   (NORVASC ) 10 MG tablet, Take 1 tablet (10 mg total) by mouth daily., Disp: 90 tablet, Rfl: 2   aspirin  EC 81 MG tablet, Take 1 tablet (81 mg total) by mouth daily. Swallow whole., Disp: , Rfl:    azelastine  (ASTELIN ) 0.1 % nasal spray, Place 1 spray into both nostrils 2 (two) times daily. Use in each nostril as directed, Disp: 30 mL, Rfl: 1   Biotin 5000 MCG CAPS, Take 5,000 mcg by  mouth daily., Disp: , Rfl:    Blood Glucose Monitoring Suppl (TRUE METRIX AIR GLUCOSE METER) DEVI, Use to test blood sugars daily. Dx: E11.9, Disp: 1 each, Rfl: 5   Cholecalciferol (VITAMIN D -3) 125 MCG (5000 UT) TABS, Take 5,000 Units by mouth daily., Disp: , Rfl:    cyanocobalamin  (,VITAMIN B-12,) 1000 MCG/ML injection, INJECT 1ML (1,000 MCG TOTAL)  INTO THE MUSCLE EVERY 30 DAYS USE ON A MONTHLY BASES, Disp: 10 mL, Rfl: 0   desvenlafaxine  (PRISTIQ ) 100 MG 24 hr tablet, Take 1 tablet (100 mg total) by mouth daily., Disp: 90 tablet, Rfl: 3   Evolocumab  (REPATHA  SURECLICK) 140 MG/ML SOAJ, Inject 140 mg into the skin every 14 (fourteen) days., Disp: 6 mL, Rfl: 1   FeFum-FePo-FA-B Cmp-C-Zn-Mn-Cu (SE-TAN PLUS ) 162-115.2-1 MG CAPS, TAKE 1 CAPSULE BY MOUTH TWICE DAILY, Disp: 180 capsule, Rfl: 3   fluticasone  (FLONASE ) 50 MCG/ACT nasal spray, Place 1 spray into both nostrils daily., Disp: , Rfl:    glucose blood test strip, Test blood sugar once per day, Disp: 100 each, Rfl: 3   Levothyroxine  Sodium 150 MCG CAPS, Take 150 mcg by mouth daily. Tirosint , Disp: , Rfl:    loperamide  (IMODIUM ) 2 MG capsule, Take 4 mg by mouth daily as needed for diarrhea or loose stools. , Disp: , Rfl:    nitroGLYCERIN  (NITROSTAT ) 0.4 MG SL tablet, Place 1 tablet (0.4 mg total) under the tongue every 5 (five) minutes as needed for chest pain., Disp: 50 tablet, Rfl: 3   Omega-3 Fatty Acids (FISH OIL PO), Take 5,000 mg by mouth daily., Disp: , Rfl:    predniSONE  (DELTASONE ) 20 MG tablet, Take 2 tablets (40 mg total) by mouth daily for 5 days., Disp: 10  tablet, Rfl: 0   promethazine -dextromethorphan (PROMETHAZINE -DM) 6.25-15 MG/5ML syrup, Take 7.5 mLs by mouth 3 (three) times daily as needed., Disp: 240 mL, Rfl: 0   Propylene Glycol (SYSTANE COMPLETE OP), Place 1 drop into both eyes in the morning., Disp: , Rfl:    Semaglutide , 1 MG/DOSE, 4 MG/3ML SOPN, Inject 2 mg into the skin once a week., Disp: , Rfl:    TRUEplus Lancets 30G MISC, Check blood sugar once per day, Disp: 100 each, Rfl: 3 [2]  Allergies Allergen Reactions   Flexeril  [Cyclobenzaprine ] Other (See Comments)    On Pristiq - possible serotonin reaction to flexeril  can not take together   Ace Inhibitors Other (See Comments) and Swelling    States after a surgery she was told to not take this class of drugs- not 100% sure what the reason was Possible angioedema during/after general anesthesia States after a surgery she was told to not take this class of drugs- not 100% sure what the reason was States after a surgery she was told to not take this class of drugs- not 100% sure what the reason was    Codeine Nausea Only and Other (See Comments)    Upset stomach   Hydrochlorothiazide      cramps   Kiwi Extract Other (See Comments)   Metoprolol      Edema, fatigue, headache, low HR into 40s   Azithromycin Rash  [3]  Social History Tobacco Use   Smoking status: Former    Current packs/day: 0.00    Average packs/day: 0.7 packs/day for 20.0 years (14.0 ttl pk-yrs)    Types: Cigarettes    Start date: 03/04/1958    Quit date: 03/04/1978    Years since quitting: 45.9   Smokeless tobacco: Never  Vaping Use  Vaping status: Never Used  Substance Use Topics   Alcohol  use: Yes    Alcohol /week: 2.0 standard drinks of alcohol     Types: 2 Glasses of wine per week    Comment: with dinner  or social rare -    Drug use: No     Aurea Goodell B, NP 01/16/24 2006  "

## 2024-01-16 NOTE — ED Triage Notes (Signed)
 Pt states that she has a sore throat, body aches and chest tightness with cough. X2 days

## 2024-01-16 NOTE — Discharge Instructions (Addendum)
" °  1. Acute viral syndrome (Primary) - POC Covid19/Flu A&B Antigen complete in UC is negative for COVID and influenza - DG Chest 2 View x-ray completed in UC shows no acute cardiopulmonary processes, no sign of consolidation or pneumonia, normal chest x-ray. - predniSONE  (DELTASONE ) 20 MG tablet; Take 2 tablets (40 mg total) by mouth daily for 5 days.  Dispense: 10 tablet; Refill: 0 - promethazine -dextromethorphan (PROMETHAZINE -DM) 6.25-15 MG/5ML syrup; Take 7.5 mLs by mouth 3 (three) times daily as needed.  Dispense: 240 mL; Refill: 0 - azelastine  (ASTELIN ) 0.1 % nasal spray; Place 1 spray into both nostrils 2 (two) times daily. Use in each nostril as directed  Dispense: 30 mL; Refill: 1  -Continue to monitor symptoms for any change in severity if there is any escalation of current symptoms or development of new symptoms follow-up in ER for further evaluation and management. "

## 2024-01-16 NOTE — Telephone Encounter (Signed)
 FYI Only or Action Required?: FYI only for provider: going to UC.  Patient was last seen in primary care on 09/25/2023 by Katrinka Garnette KIDD, MD.  Called Nurse Triage reporting Cough and Sore Throat.  Symptoms began yesterday.  Interventions attempted: Rest, hydration, or home remedies.  Symptoms are: gradually worsening.  Triage Disposition: See Physician Within 24 Hours  Patient/caregiver understands and will follow disposition?: Yes  Copied from CRM #[id number]. Topic: Clinical - Red Word Triage >> Jan 16, 2024  4:50 PM Sasha M wrote: Red Word that prompted transfer to Nurse Triage: chest feels tight, sore throat, not productive cough, general not feeling well Reason for Disposition  [1] Pus on tonsils (back of throat) AND [2] fever AND [3] swollen neck lymph nodes (glands)  Answer Assessment - Initial Assessment Questions Dry Cough  and sore throat started yesterday. Center of the Chest feels tight with deep breathing. Denies wheezing, SOB, Dizziness, fever Daughter is present- asked to look in throat-- white patches where her tonsils used to be   Discussed VUC or UC as options for potential strep throat. Patient will go to UC for assement and testing. ED precautions understood. 1. ONSET: When did the cough begin?      Started yesterday  2. SEVERITY: How bad is the cough today?      Hard to get through conversation without coughing or clearing throat  3. SPUTUM: Describe the color of your sputum (e.g., none, dry cough; clear, white, yellow, green)     Denies  4. HEMOPTYSIS: Are you coughing up any blood? If Yes, ask: How much? (e.g., flecks, streaks, tablespoons, etc.)     Denies  5. DIFFICULTY BREATHING: Are you having difficulty breathing? If Yes, ask: How bad is it? (e.g., mild, moderate, severe)      Chest feels tight with deep breathing- denies SOB  6. FEVER: Do you have a fever? If Yes, ask: What is your temperature, how was it measured, and when did it  start?     Denies  7. CARDIAC HISTORY: Do you have any history of heart disease? (e.g., heart attack, congestive heart failure)      Denies  8. LUNG HISTORY: Do you have any history of lung disease?  (e.g., pulmonary embolus, asthma, emphysema)     asthma 9. PE RISK FACTORS: Do you have a history of blood clots? (or: recent major surgery, recent prolonged travel, bedridden)     Denies  10. OTHER SYMPTOMS: Do you have any other symptoms? (e.g., runny nose, wheezing, chest pain)       Chest tightness with deep breath, sore throat, body aches, dry not productive cough  Answer Assessment - Initial Assessment Questions 1. ONSET: When did the throat start hurting? (Hours or days ago)      Yesterday  2. SEVERITY: How bad is the sore throat? (Scale 1-10; mild, moderate or severe)     Moderate- worse with coughing 3. STREP EXPOSURE: Has there been any exposure to strep within the past week? If Yes, ask: What type of contact occurred?      Denies any known exposure 4.  VIRAL SYMPTOMS: Are there any symptoms of a cold, such as a runny nose, cough, hoarse voice or red eyes?      Cough, sore throat, chest tightness with coughing 5. FEVER: Do you have a fever? If Yes, ask: What is your temperature, how was it measured, and when did it start?     denies 6. PUS ON THE TONSILS:  Is there pus on the tonsils in the back of your throat?     White patches on the tonsils 7. OTHER SYMPTOMS: Do you have any other symptoms? (e.g., difficulty breathing, headache, rash)     Cough  Protocols used: Cough - Acute Non-Productive-A-AH, Sore Throat-A-AH

## 2024-01-19 ENCOUNTER — Encounter: Payer: Self-pay | Admitting: Family Medicine

## 2024-01-19 ENCOUNTER — Ambulatory Visit: Payer: Self-pay

## 2024-01-19 ENCOUNTER — Ambulatory Visit (HOSPITAL_COMMUNITY): Payer: Self-pay

## 2024-01-19 NOTE — Telephone Encounter (Signed)
 FYI Only or Action Required?: Action required by provider: clinical question for provider and update on patient condition.  Patient was last seen in primary care on 09/25/2023 by Katrinka Garnette KIDD, MD.  Called Nurse Triage reporting Sore Throat and Cough.  Symptoms began several days ago.  Interventions attempted: OTC medications: Tylenol .  Symptoms are: unchanged.  Triage Disposition: Call PCP Now  Patient/caregiver understands and will follow disposition?: Yes      Copied from CRM #[id number]. Topic: Clinical - Red Word Triage >> Jan 19, 2024  3:45 PM Alexandria E wrote: [facility] that prompted transfer to Nurse Triage: Patient was seen in UC 12/26, was prescribed prednisone , but has not taken it. Patient experiencing sore throat, cough, fatigue, body aches and pains, and nasal drainage. Reason for Disposition  [1] Caller requests to speak ONLY to PCP AND [2] URGENT question  Answer Assessment - Initial Assessment Questions 1. REASON FOR CALL or QUESTION: What is your reason for calling today? or How can I best   Patient called in to triage with complaints of sore throat, cough, fatigue. This has been ongoing for for a few days. The patient stated she was seen in UC on 12/26, Covid and Flu was negative. She has not taken the Prednisone  40mg  x 5 days as prescribed by that provider, as she is unsure if she should/can take it. She would like for a provider to let her know if its ok to take. She was concerned about the dosage Please advise.  Protocols used: PCP Call - No Triage-A-AH

## 2024-01-19 NOTE — Telephone Encounter (Signed)
 Pt going to urgent care. Dr. Katrinka will review triage notes.

## 2024-01-20 ENCOUNTER — Other Ambulatory Visit: Payer: Self-pay | Admitting: Family Medicine

## 2024-01-20 ENCOUNTER — Other Ambulatory Visit (HOSPITAL_COMMUNITY): Payer: Self-pay

## 2024-01-20 ENCOUNTER — Encounter: Payer: Self-pay | Admitting: Family Medicine

## 2024-01-20 DIAGNOSIS — J4521 Mild intermittent asthma with (acute) exacerbation: Secondary | ICD-10-CM

## 2024-01-20 MED ORDER — ALBUTEROL SULFATE HFA 108 (90 BASE) MCG/ACT IN AERS
1.0000 | INHALATION_SPRAY | RESPIRATORY_TRACT | 2 refills | Status: AC | PRN
  Filled 2024-01-20: qty 6.7, 25d supply, fill #0

## 2024-01-23 ENCOUNTER — Encounter: Payer: Self-pay | Admitting: Family Medicine

## 2024-01-23 NOTE — Telephone Encounter (Signed)
 See mychart to patient I sen tjus tnow

## 2024-01-27 ENCOUNTER — Encounter: Payer: Self-pay | Admitting: Family Medicine

## 2024-01-27 ENCOUNTER — Other Ambulatory Visit: Payer: Self-pay

## 2024-01-27 ENCOUNTER — Ambulatory Visit: Admitting: Family Medicine

## 2024-01-27 ENCOUNTER — Other Ambulatory Visit (HOSPITAL_COMMUNITY): Payer: Self-pay

## 2024-01-27 ENCOUNTER — Ambulatory Visit: Payer: Self-pay | Admitting: Family Medicine

## 2024-01-27 VITALS — BP 138/82 | HR 78 | Temp 98.2°F | Ht 61.0 in | Wt 198.6 lb

## 2024-01-27 DIAGNOSIS — E785 Hyperlipidemia, unspecified: Secondary | ICD-10-CM

## 2024-01-27 DIAGNOSIS — I10 Essential (primary) hypertension: Secondary | ICD-10-CM

## 2024-01-27 DIAGNOSIS — J029 Acute pharyngitis, unspecified: Secondary | ICD-10-CM | POA: Diagnosis not present

## 2024-01-27 DIAGNOSIS — Z0000 Encounter for general adult medical examination without abnormal findings: Secondary | ICD-10-CM

## 2024-01-27 DIAGNOSIS — E119 Type 2 diabetes mellitus without complications: Secondary | ICD-10-CM

## 2024-01-27 DIAGNOSIS — E538 Deficiency of other specified B group vitamins: Secondary | ICD-10-CM | POA: Diagnosis not present

## 2024-01-27 DIAGNOSIS — E1169 Type 2 diabetes mellitus with other specified complication: Secondary | ICD-10-CM

## 2024-01-27 LAB — CBC WITH DIFFERENTIAL/PLATELET
Basophils Absolute: 0.1 K/uL (ref 0.0–0.1)
Basophils Relative: 0.6 % (ref 0.0–3.0)
Eosinophils Absolute: 0.3 K/uL (ref 0.0–0.7)
Eosinophils Relative: 3.2 % (ref 0.0–5.0)
HCT: 45 % (ref 36.0–46.0)
Hemoglobin: 15.1 g/dL — ABNORMAL HIGH (ref 12.0–15.0)
Lymphocytes Relative: 23.7 % (ref 12.0–46.0)
Lymphs Abs: 2.2 K/uL (ref 0.7–4.0)
MCHC: 33.5 g/dL (ref 30.0–36.0)
MCV: 89 fl (ref 78.0–100.0)
Monocytes Absolute: 0.7 K/uL (ref 0.1–1.0)
Monocytes Relative: 7.4 % (ref 3.0–12.0)
Neutro Abs: 6.1 K/uL (ref 1.4–7.7)
Neutrophils Relative %: 65.1 % (ref 43.0–77.0)
Platelets: 275 K/uL (ref 150.0–400.0)
RBC: 5.06 Mil/uL (ref 3.87–5.11)
RDW: 13.1 % (ref 11.5–15.5)
WBC: 9.3 K/uL (ref 4.0–10.5)

## 2024-01-27 LAB — URINALYSIS, ROUTINE W REFLEX MICROSCOPIC
Bilirubin Urine: NEGATIVE
Hgb urine dipstick: NEGATIVE
Ketones, ur: NEGATIVE
Nitrite: NEGATIVE
RBC / HPF: NONE SEEN
Specific Gravity, Urine: 1.01 (ref 1.000–1.030)
Total Protein, Urine: NEGATIVE
Urine Glucose: NEGATIVE
Urobilinogen, UA: 0.2 (ref 0.0–1.0)
pH: 6.5 (ref 5.0–8.0)

## 2024-01-27 LAB — COMPREHENSIVE METABOLIC PANEL WITH GFR
ALT: 30 U/L (ref 3–35)
AST: 25 U/L (ref 5–37)
Albumin: 3.9 g/dL (ref 3.5–5.2)
Alkaline Phosphatase: 84 U/L (ref 39–117)
BUN: 12 mg/dL (ref 6–23)
CO2: 32 meq/L (ref 19–32)
Calcium: 9.3 mg/dL (ref 8.4–10.5)
Chloride: 96 meq/L (ref 96–112)
Creatinine, Ser: 1.01 mg/dL (ref 0.40–1.20)
GFR: 51.35 mL/min — ABNORMAL LOW
Glucose, Bld: 107 mg/dL — ABNORMAL HIGH (ref 70–99)
Potassium: 3.6 meq/L (ref 3.5–5.1)
Sodium: 134 meq/L — ABNORMAL LOW (ref 135–145)
Total Bilirubin: 0.6 mg/dL (ref 0.2–1.2)
Total Protein: 6.9 g/dL (ref 6.0–8.3)

## 2024-01-27 LAB — HEMOGLOBIN A1C: Hgb A1c MFr Bld: 6.3 % (ref 4.6–6.5)

## 2024-01-27 LAB — LIPID PANEL
Cholesterol: 131 mg/dL (ref 28–200)
HDL: 59.7 mg/dL
LDL Cholesterol: 31 mg/dL (ref 10–99)
NonHDL: 71.12
Total CHOL/HDL Ratio: 2
Triglycerides: 199 mg/dL — ABNORMAL HIGH (ref 10.0–149.0)
VLDL: 39.8 mg/dL (ref 0.0–40.0)

## 2024-01-27 LAB — VITAMIN B12: Vitamin B-12: 998 pg/mL — ABNORMAL HIGH (ref 211–911)

## 2024-01-27 MED ORDER — AMOXICILLIN-POT CLAVULANATE 875-125 MG PO TABS
1.0000 | ORAL_TABLET | Freq: Two times a day (BID) | ORAL | 0 refills | Status: AC
  Filled 2024-01-27: qty 20, 10d supply, fill #0

## 2024-01-27 MED ORDER — HYDROCODONE-ACETAMINOPHEN 5-325 MG PO TABS
1.0000 | ORAL_TABLET | Freq: Four times a day (QID) | ORAL | 0 refills | Status: AC | PRN
  Filled 2024-01-27 (×2): qty 30, 4d supply, fill #0

## 2024-01-27 NOTE — Progress Notes (Signed)
 " Phone [phone]   Subjective:  Patient presents today for their annual physical. Chief complaint-noted.   See problem oriented charting- ROS- full  review of systems was completed and negative except for topics noted under acute/chronic concerns   The following were reviewed and entered/updated in epic: Past Medical History:  Diagnosis Date   Anemia    Anxiety    Arthritis    osteoarthritis - shoulder, knees & hips   Asthma    Coronary artery disease    Depression    Diabetes mellitus without complication (HCC)    no meds   Fibromyalgia    Heart murmur    History of hiatal hernia    Hyperparathyroidism    Hypertension    Dr. DOROTHA Budge manages BP, pt. reports MD has not found a need for treatment    Hypothyroidism    Polymyalgia rheumatica    Sleep difficulties    had sleep study -2009, prior to gastric surgery, told that there was not a need for f/u   Thyroid  disease    Vitamin B 12 deficiency    Patient Active Problem List   Diagnosis Date Noted   History of subdural hematoma 02/27/2022    Priority: High   Hyperparathyroidism, primary 03/14/2021    Priority: High   Osteoporosis 10/29/2020    Priority: High   History of polymyalgia rheumatica 03/16/2018    Priority: High   CAD (coronary artery disease) 11/19/2017    Priority: High   Hypercalcemia 12/29/2014    Priority: High   OA (osteoarthritis) of knee 10/05/2013    Priority: High   Well controlled type 2 diabetes mellitus (HCC) 07/18/2006    Priority: High   Aortic atherosclerosis 02/16/2021    Priority: Medium    Mild aortic stenosis 11/16/2020    Priority: Medium    B12 deficiency 05/31/2019    Priority: Medium    Overactive bladder 09/03/2016    Priority: Medium    Morbid obesity (HCC) 06/28/2015    Priority: Medium    History of gastric bypass 02/08/2014    Priority: Medium    Hyperlipidemia associated with type 2 diabetes mellitus (HCC) 01/05/2008    Priority: Medium    Hypothyroidism  09/09/2007    Priority: Medium    Major depression, recurrent, full remission 07/18/2006    Priority: Medium    Essential hypertension 07/18/2006    Priority: Medium    Asthma 07/18/2006    Priority: Medium    Primary osteoarthritis of right knee 06/28/2019    Priority: 1.   CHEST WALL PAIN, ANTERIOR 10/09/2007    Priority: 1.   Allergic rhinitis 07/20/2014    Priority: Low   Past Surgical History:  Procedure Laterality Date   ABDOMINAL HYSTERECTOMY     BARIATRIC SURGERY     CARDIAC CATHETERIZATION     [facility]- [facility] yrs. ago   CATARACT EXTRACTION, BILATERAL     late 2018   LEFT HEART CATH AND CORONARY ANGIOGRAPHY N/A 04/02/2018   Procedure: LEFT HEART CATH AND CORONARY ANGIOGRAPHY;  Surgeon: Jordan, Peter M, MD;  Location: [facility] INVASIVE CV LAB;  Service: Cardiovascular;  Laterality: N/A;   OOPHORECTOMY     pantallor arthrodesis with rod placement left foot     PARATHYROIDECTOMY Left 03/16/2021   Procedure: LEFT INFERIOR PARATHYROIDECTOMY;  Surgeon: Eletha Boas, MD;  Location: [facility] ORS;  Service: General;  Laterality: Left;   REVERSE SHOULDER ARTHROPLASTY Right 12/26/2022   Procedure: REVERSE SHOULDER ARTHROPLASTY;  Surgeon: Melita Drivers, MD;  Location: [facility];  Service: Orthopedics;  Laterality: Right;    TONSILLECTOMY     TOTAL KNEE ARTHROPLASTY Left 11/30/2018   Procedure: TOTAL KNEE ARTHROPLASTY;  Surgeon: Melodi Lerner, MD;  Location: [facility];  Service: Orthopedics;  Laterality: Left;   TOTAL KNEE ARTHROPLASTY Right 06/28/2019   Procedure: TOTAL KNEE ARTHROPLASTY;  Surgeon: Melodi Lerner, MD;  Location: [facility];  Service: Orthopedics;  Laterality: Right;    TOTAL SHOULDER ARTHROPLASTY Left 03/12/2012   Procedure: TOTAL SHOULDER ARTHROPLASTY;  Surgeon: Franky CHRISTELLA Pointer, MD;  Location: [facility] OR;  Service: Orthopedics;  Laterality: Left;    Family History  Problem Relation Age of Onset   Stroke Mother    Liver cancer Father    Cancer Father        liver   Breast cancer Neg Hx      Medications- reviewed and updated Current Outpatient Medications  Medication Sig Dispense Refill   acetaminophen  (TYLENOL ) 500 MG tablet Take 500 mg by mouth every 6 (six) hours as needed for mild pain (pain score 1-3) or moderate pain (pain score 4-6).     albuterol  (VENTOLIN  HFA) 108 (90 Base) MCG/ACT inhaler Inhale 1-2 puffs into the lungs every 4 (four) hours as needed for wheezing or shortness of breath. 6.7 g 2   amLODipine  (NORVASC ) 10 MG tablet Take 1 tablet (10 mg total) by mouth daily. 90 tablet 2   aspirin  EC 81 MG tablet Take 1 tablet (81 mg total) by mouth daily. Swallow whole.     azelastine  (ASTELIN ) 0.1 % nasal spray Place 1 spray into both nostrils 2 (two) times daily. Use in each nostril as directed 30 mL 1   Biotin 5000 MCG CAPS Take 5,000 mcg by mouth daily.     Blood Glucose Monitoring Suppl (TRUE METRIX AIR GLUCOSE METER) DEVI Use to test blood sugars daily. Dx: E11.9 1 each 5   Cholecalciferol (VITAMIN D -3) 125 MCG (5000 UT) TABS Take 5,000 Units by mouth daily.     cyanocobalamin  (,VITAMIN B-12,) 1000 MCG/ML injection INJECT 1ML (1,000 MCG TOTAL)  INTO THE MUSCLE EVERY 30 DAYS USE ON A MONTHLY BASES 10 mL 0   desvenlafaxine  (PRISTIQ ) 100 MG 24 hr tablet Take 1 tablet (100 mg total) by mouth daily. 90 tablet 3   Evolocumab  (REPATHA  SURECLICK) 140 MG/ML SOAJ Inject 140 mg into the skin every 14 (fourteen) days. 6 mL 1   FeFum-FePo-FA-B Cmp-C-Zn-Mn-Cu (SE-TAN PLUS ) 162-115.2-1 MG CAPS TAKE 1 CAPSULE BY MOUTH TWICE DAILY 180 capsule 3   fluticasone  (FLONASE ) 50 MCG/ACT nasal spray Place 1 spray into both nostrils daily.     glucose blood test strip Test blood sugar once per day 100 each 3   Levothyroxine  Sodium 150 MCG CAPS Take 150 mcg by mouth daily. Tirosint      loperamide  (IMODIUM ) 2 MG capsule Take 4 mg by mouth daily as needed for diarrhea or loose stools.      nitroGLYCERIN  (NITROSTAT ) 0.4 MG SL tablet Place 1 tablet (0.4 mg total) under the tongue every 5 (five)  minutes as needed for chest pain. 50 tablet 3   Omega-3 Fatty Acids (FISH OIL PO) Take 5,000 mg by mouth daily.     promethazine -dextromethorphan (PROMETHAZINE -DM) 6.25-15 MG/5ML syrup Take 7.5 mLs by mouth 3 (three) times daily as needed. 240 mL 0   Propylene Glycol (SYSTANE COMPLETE OP) Place 1 drop into both eyes in the morning.     Semaglutide , 1 MG/DOSE, 4 MG/3ML SOPN Inject 2 mg  into the skin once a week.     TRUEplus Lancets 30G MISC Check blood sugar once per day 100 each 3   amoxicillin -clavulanate (AUGMENTIN ) 875-125 MG tablet Take 1 tablet by mouth 2 (two) times daily for 10 days. 20 tablet 0   HYDROcodone -acetaminophen  (NORCO/VICODIN) 5-325 MG tablet Take 1-2 tablets by mouth every 6 (six) hours as needed for moderate pain (pain score 4-6). 30 tablet 0   No current facility-administered medications for this visit.    Allergies-reviewed and updated Allergies[1]  Social History   Social History Narrative   Widowed 2013. 2 kids. 4 grandkids. Oldest daughter lives in [place] and has 2 grandkids that are now in Oceanographer, PT school at [employer].       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 [place])   Does some dog sitting at her home   Live alone /2025/ and 1 dog   Objective  Objective:  BP 138/82 (BP Location: Left Arm, Patient Position: Sitting, Cuff Size: Normal)   Pulse 78   Temp 98.2 F (36.8 C) (Temporal)   Ht 5' 1 (1.549 m)   Wt 198 lb 9.6 oz (90.1 kg)   SpO2 96%   BMI 37.53 kg/m  Gen: NAD, resting comfortably HEENT: Mucous membranes are moist. Oropharynx normal Neck: no thyromegaly CV: RRR stable faint murmur   lungs: CTAB no crackles, wheeze, rhonchi Abdomen: soft/nontender/nondistended/normal bowel sounds. No rebound or guarding.  Ext: no edema Skin: warm, dry Neuro: grossly normal, moves all extremities, PERRLA  Diabetic foot exam was performed with the following  findings:   No deformities, ulcerations, or other skin breakdown Normal sensation of 10g monofilament Intact posterior tibialis and dorsalis pedis pulses      Assessment and Plan   84 y.o. female presenting for annual physical.  Health Maintenance counseling: 1. Anticipatory guidance: Patient counseled regarding regular dental exams -dentures and her daughter is a education officer, community, eye exams - regular visits and upcoming,  avoiding smoking and second hand smoke , limiting alcohol  to 1 beverage per day- very rare social , no illicit drugs .   2. Risk factor reduction:  Advised patient of need for regular exercise and diet rich and fruits and vegetables to reduce risk of heart attack and stroke.  Exercise- swim one day a week at [place] and exercise once a week.  Diet/weight management-Down 8 pounds from last visit despite the holidays!  She is also down 18 pounds from last physical.  Morbid obesity status noted with BMI over 35-and diabetes Wt Readings from Last 3 Encounters:  01/27/24 198 lb 9.6 oz (90.1 kg)  01/16/24 210 lb (95.3 kg)  11/14/23 206 lb 3.2 oz (93.5 kg)  3. Immunizations/screenings/ancillary studies-COVID vaccination- had this year but just before new vaccine came out- could update.  Otherwise up-to-date Immunization History  Administered Date(s) Administered   Fluad Quad(high Dose 65+) 09/22/2018, 10/19/2019, 10/07/2020, 10/02/2021, 09/02/2023   INFLUENZA, HIGH DOSE SEASONAL PF 10/28/2014, 09/28/2015, 10/18/2016, 11/19/2017   Influenza Split 10/22/2010, 11/14/2011, 10/21/2013   Influenza Whole 11/13/2006, 10/27/2008   Influenza,inj,Quad PF,6+ Mos 10/05/2012, 10/05/2013   Influenza-Unspecified 10/21/2013   Moderna Covid-19 Fall Seasonal Vaccine 40yrs & older 10/15/2022   PFIZER(Purple Top)SARS-COV-2 Vaccination 02/11/2019, 03/04/2019, 09/04/2019, 03/11/2020   PNEUMOCOCCAL CONJUGATE-20 10/17/2021   Pfizer Covid-19 Vaccine Bivalent Booster 54yrs & up 11/06/2020, 10/10/2021    Pneumococcal Conjugate-13 03/01/2013, 10/21/2013   Pneumococcal Polysaccharide-23 05/31/2006  RSV,unspecified 10/10/2021   Respiratory Syncytial Virus Vaccine,Recomb Aduvanted(Arexvy) 10/10/2021   Td 11/24/1998, 03/01/2013   Tdap 10/15/2022   Unspecified SARS-COV-2 Vaccination 09/02/2023   Zoster Recombinant(Shingrix ) 10/31/2016, 05/01/2017   Zoster, Live 06/20/2006   4. Cervical cancer screening- no history of abnormal Pap smear and past age based screening recommendations 5. Breast cancer screening-opts out of further mammograms 6. Colon cancer screening - gastroenterology has released her as long as no new symptoms 7. Skin cancer screening-no dermatologist. advised regular sunscreen use. Denies worrisome, changing, or new skin lesions.  8. Birth control/STD check-postmenopausal/not dating 59. Osteoporosis screening at 65-sees Dr. Dale with endocrinology 10. Smoking associated screening -former smoker but quit over 45 years ago so no regular screening  Status of chronic or acute concerns   # Recent illness-seen in urgent care 01/16/2024 and diagnosed with acute viral syndrome-started on promethazine , prednisone  and Astelin  after COVID and flu testing was negative.  X-ray with no acute cardiopulmonary process.  She initially held on the prednisone  but had continued sore throat and started that on 01/19/2024.  We refilled her albuterol  given wheezing issues.  She does have a history of asthma so prednisone  would be a preferred treatment with wheezing. Sore throat also improving. Never had fever. Thankfully improving -looking back never tested for strep but also on exam yellow and green drainage in nares as well as maxillary sinus tenderness concerning for bacterial sinusitis- we opted to cover for this plus strep with 10 days of Augmentin - she will return if fails to improve  #Severe osteoarthritis of knees (history of surgery on both)- opts out of  repeat surgery- very sparing hydrocodone   5-325 mg perhaps once a month. Not needing  -back doing better lately  #no longer urinating at night-  wants to check her kidneys which is reasonable- with blood pressure this is reasonable. Prior diclofenac   #history of substantial subdural hematoma/fall in February 2024 -no falls thankfully- uses cane  # Diabetes S: Medication: Ozempic  0.5 mg--> 1 mg--> 2 mg (she picks up here) Lab Results  Component Value Date   HGBA1C 6.4 09/25/2023   HGBA1C 6.2 05/22/2023   HGBA1C 6.4 11/26/2022  A/P: hopefully stable- update a1c today. Continue current meds for now   #hypertension S: medication: amlodipine  10 mg BP Readings from Last 3 Encounters:  01/27/24 138/82  01/16/24 (!) 170/94  11/14/23 (!) 148/76  A/P: blood pressure reasonable control- continue current medications   # CAD-previously with Dr. Esmeralda Smith--> Dr. Donald Dr. Burton #hyperlipidemia with aortic atherosclerosis S: Medication: Repatha  140 mg q2 weeks, aspirin  81 mg -no chest pain or shortness of breath -outside of illness Lab Results  Component Value Date   CHOL 123 11/26/2022   HDL 51 11/26/2022   LDLCALC 51 11/26/2022   LDLDIRECT 53.0 09/25/2023   TRIG 125 11/26/2022   CHOLHDL 3 01/08/2022  A/P: coronary artery disease asymptomatic continue current medications Lipids at goal- update today  #hypothyroidism- monitored by [facility]. Dale S: compliant On thyroid  medication- levothyroxine  150 mcg A/P:stable- continue current medicines - checks with [facility]  # B12 deficiency S: Current treatment/medication (oral vs. IM):  1000 mcg injections at home in past Lab Results  Component Value Date   VITAMINB12 586 05/22/2023  A/P: has been doing fine loff medicines- will update   # Depression S: Medication: Pristiq  100 mg     01/27/2024   12:45 PM 09/25/2023    9:33 AM 07/24/2023   12:07 PM  Depression screen PHQ 2/9  Decreased Interest  0 0 0  Down, Depressed, Hopeless 0 0 0  PHQ - 2 Score 0 0 0  Altered sleeping  0 3 1  Tired, decreased energy 0 0 3  Change in appetite 1 0 0  Feeling bad or failure about yourself  0 0 0  Trouble concentrating 0 0 0  Moving slowly or fidgety/restless 0 0 0  Suicidal thoughts 0 0 0  PHQ-9 Score 1 3  4    Difficult doing work/chores Not difficult at all Not difficult at all Not difficult at all     Data saved with a previous flowsheet row definition  A/P: full remission- continue current medications   # Osteoporosis and hyperparathyroidism-followed by Dr. Sebastian   Recommended follow up: Return in about 6 months (around 07/26/2024) for followup or sooner if needed.Schedule b4 you leave. Future Appointments  Date Time Provider Department Center  07/28/2024 11:20 AM [facility] ANNUAL WELLNESS VISIT 1 [facility] [place]    Lab/Order associations:NOT fasting   ICD-10-CM   1. Preventative health care  Z00.00     2. B12 deficiency  E53.8 Vitamin B12    3. Well controlled type 2 diabetes mellitus (HCC)  E11.9 Hemoglobin A1c    CBC with Differential/Platelet    Comprehensive metabolic panel with GFR    4. Essential hypertension  I10 CBC with Differential/Platelet    Comprehensive metabolic panel with GFR    Urinalysis, Routine w reflex microscopic    5. Hyperlipidemia associated with type 2 diabetes mellitus (HCC)  E11.69 CBC with Differential/Platelet   E78.5 Comprehensive metabolic panel with GFR    Lipid panel    6. Morbid obesity (HCC)  E66.01     7. Sore throat  J02.9 POCT rapid strep A      Meds ordered this encounter  Medications   HYDROcodone -acetaminophen  (NORCO/VICODIN) 5-325 MG tablet    Sig: Take 1-2 tablets by mouth every 6 (six) hours as needed for moderate pain (pain score 4-6).    Dispense:  30 tablet    Refill:  0    OA of the knees-back pain as well   amoxicillin -clavulanate (AUGMENTIN ) 875-125 MG tablet    Sig: Take 1 tablet by mouth 2 (two) times daily for 10 days.    Dispense:  20 tablet    Refill:  0    Return precautions  advised.  Garnette Lukes, MD      [1]  Allergies Allergen Reactions   Flexeril  [Cyclobenzaprine ] Other (See Comments)    On Pristiq - possible serotonin reaction to flexeril  can not take together   Ace Inhibitors Other (See Comments) and Swelling    States after a surgery she was told to not take this class of drugs- not 100% sure what the reason was Possible angioedema during/after general anesthesia States after a surgery she was told to not take this class of drugs- not 100% sure what the reason was States after a surgery she was told to not take this class of drugs- not 100% sure what the reason was    Codeine Nausea Only and Other (See Comments)    Upset stomach   Hydrochlorothiazide      cramps   Kiwi Extract Other (See Comments)   Metoprolol      Edema, fatigue, headache, low HR into 40s   Azithromycin Rash   "

## 2024-01-27 NOTE — Patient Instructions (Addendum)
-  looking back never tested for strep but also on exam yellow and green drainage in nares as well as maxillary sinus tenderness concerning for bacterial sinusitis- we opted to cover for this plus strep with 10 days of Augmentin - she will return if fails to improve  Please stop by lab before you go If you have mychart- we will send your results within 3 business days of us  receiving them.  If you do not have mychart- we will call you about results within 5 business days of us  receiving them.  *please also note that you will see labs on mychart as soon as they post. I will later go in and write notes on them- will say notes from Dr. Katrinka   Recommended follow up: Return in about 6 months (around 07/26/2024) for followup or sooner if needed.Schedule b4 you leave.

## 2024-02-03 LAB — OPHTHALMOLOGY REPORT-SCANNED

## 2024-02-10 ENCOUNTER — Other Ambulatory Visit: Payer: Self-pay

## 2024-02-10 ENCOUNTER — Other Ambulatory Visit: Payer: Self-pay | Admitting: Family Medicine

## 2024-02-10 ENCOUNTER — Other Ambulatory Visit (HOSPITAL_COMMUNITY): Payer: Self-pay

## 2024-02-10 MED ORDER — DESVENLAFAXINE SUCCINATE ER 100 MG PO TB24
100.0000 mg | ORAL_TABLET | Freq: Every day | ORAL | 3 refills | Status: AC
  Filled 2024-02-10: qty 90, 90d supply, fill #0

## 2024-02-26 ENCOUNTER — Other Ambulatory Visit: Payer: Self-pay | Admitting: Family Medicine

## 2024-02-26 ENCOUNTER — Encounter: Payer: Self-pay | Admitting: Family Medicine

## 2024-02-26 ENCOUNTER — Other Ambulatory Visit (HOSPITAL_COMMUNITY): Payer: Self-pay

## 2024-02-26 ENCOUNTER — Other Ambulatory Visit: Payer: Self-pay

## 2024-02-26 MED ORDER — SEMAGLUTIDE (2 MG/DOSE) 8 MG/3ML ~~LOC~~ SOPN
2.0000 mg | PEN_INJECTOR | SUBCUTANEOUS | 3 refills | Status: AC
  Filled 2024-02-26: qty 3, 28d supply, fill #0

## 2024-07-26 ENCOUNTER — Ambulatory Visit: Admitting: Family Medicine

## 2024-07-28 ENCOUNTER — Ambulatory Visit
